# Patient Record
Sex: Female | Born: 1935 | Race: Black or African American | Hispanic: No | Smoking: Never smoker
Health system: Southern US, Community
[De-identification: ages and names within clinical notes are randomized; demographics above are authoritative.]

## PROBLEM LIST (undated history)

## (undated) DIAGNOSIS — E538 Deficiency of other specified B group vitamins: Secondary | ICD-10-CM

## (undated) DIAGNOSIS — K5730 Diverticulosis of large intestine without perforation or abscess without bleeding: Secondary | ICD-10-CM

## (undated) DIAGNOSIS — D649 Anemia, unspecified: Secondary | ICD-10-CM

## (undated) DIAGNOSIS — I10 Essential (primary) hypertension: Secondary | ICD-10-CM

## (undated) DIAGNOSIS — L042 Acute lymphadenitis of upper limb: Secondary | ICD-10-CM

## (undated) DIAGNOSIS — M179 Osteoarthritis of knee, unspecified: Secondary | ICD-10-CM

## (undated) DIAGNOSIS — K2970 Gastritis, unspecified, without bleeding: Secondary | ICD-10-CM

## (undated) DIAGNOSIS — F329 Major depressive disorder, single episode, unspecified: Secondary | ICD-10-CM

## (undated) DIAGNOSIS — C50919 Malignant neoplasm of unspecified site of unspecified female breast: Secondary | ICD-10-CM

## (undated) DIAGNOSIS — B9681 Helicobacter pylori [H. pylori] as the cause of diseases classified elsewhere: Secondary | ICD-10-CM

## (undated) DIAGNOSIS — M1710 Unilateral primary osteoarthritis, unspecified knee: Secondary | ICD-10-CM

## (undated) DIAGNOSIS — R011 Cardiac murmur, unspecified: Secondary | ICD-10-CM

## (undated) DIAGNOSIS — I5042 Chronic combined systolic (congestive) and diastolic (congestive) heart failure: Secondary | ICD-10-CM

## (undated) DIAGNOSIS — E785 Hyperlipidemia, unspecified: Secondary | ICD-10-CM

## (undated) DIAGNOSIS — I4891 Unspecified atrial fibrillation: Secondary | ICD-10-CM

## (undated) DIAGNOSIS — K219 Gastro-esophageal reflux disease without esophagitis: Secondary | ICD-10-CM

## (undated) DIAGNOSIS — I420 Dilated cardiomyopathy: Secondary | ICD-10-CM

## (undated) DIAGNOSIS — C4490 Unspecified malignant neoplasm of skin, unspecified: Secondary | ICD-10-CM

## (undated) HISTORY — DX: Unspecified malignant neoplasm of skin, unspecified: C44.90

## (undated) HISTORY — DX: Gastritis, unspecified, without bleeding: K29.70

## (undated) HISTORY — DX: Major depressive disorder, single episode, unspecified: F32.9

## (undated) HISTORY — DX: Malignant neoplasm of unspecified site of unspecified female breast: C50.919

## (undated) HISTORY — DX: Diverticulosis of large intestine without perforation or abscess without bleeding: K57.30

## (undated) HISTORY — DX: Deficiency of other specified B group vitamins: E53.8

## (undated) HISTORY — PX: JOINT REPLACEMENT: SHX530

## (undated) HISTORY — PX: ABDOMINAL HYSTERECTOMY: SHX81

## (undated) HISTORY — DX: Unilateral primary osteoarthritis, unspecified knee: M17.10

## (undated) HISTORY — DX: Hyperlipidemia, unspecified: E78.5

## (undated) HISTORY — DX: Anemia, unspecified: D64.9

## (undated) HISTORY — DX: Essential (primary) hypertension: I10

## (undated) HISTORY — DX: Gastro-esophageal reflux disease without esophagitis: K21.9

## (undated) HISTORY — DX: Osteoarthritis of knee, unspecified: M17.9

## (undated) HISTORY — DX: Helicobacter pylori (H. pylori) as the cause of diseases classified elsewhere: B96.81

## (undated) HISTORY — DX: Unspecified atrial fibrillation: I48.91

---
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
## 1997-12-16 ENCOUNTER — Inpatient Hospital Stay (HOSPITAL_COMMUNITY): Admission: EM | Admit: 1997-12-16 | Discharge: 1997-12-20 | Payer: Self-pay | Admitting: Hematology and Oncology

## 1998-05-01 ENCOUNTER — Ambulatory Visit (HOSPITAL_COMMUNITY): Admission: RE | Admit: 1998-05-01 | Discharge: 1998-05-01 | Payer: Self-pay | Admitting: Cardiology

## 1998-05-01 ENCOUNTER — Encounter: Payer: Self-pay | Admitting: Cardiology

## 1998-05-15 ENCOUNTER — Other Ambulatory Visit: Admission: RE | Admit: 1998-05-15 | Discharge: 1998-05-15 | Payer: Self-pay | Admitting: Specialist

## 1998-06-04 ENCOUNTER — Inpatient Hospital Stay (HOSPITAL_COMMUNITY): Admission: RE | Admit: 1998-06-04 | Discharge: 1998-06-11 | Payer: Self-pay | Admitting: Specialist

## 1998-06-04 HISTORY — PX: TOTAL KNEE ARTHROPLASTY: SHX125

## 1998-10-22 ENCOUNTER — Ambulatory Visit (HOSPITAL_COMMUNITY): Admission: RE | Admit: 1998-10-22 | Discharge: 1998-10-22 | Payer: Self-pay | Admitting: *Deleted

## 1998-10-22 ENCOUNTER — Encounter: Payer: Self-pay | Admitting: *Deleted

## 1999-08-21 ENCOUNTER — Encounter: Payer: Self-pay | Admitting: Oncology

## 1999-08-21 ENCOUNTER — Encounter: Admission: RE | Admit: 1999-08-21 | Discharge: 1999-08-21 | Payer: Self-pay | Admitting: Oncology

## 1999-10-21 ENCOUNTER — Encounter: Payer: Self-pay | Admitting: Gastroenterology

## 1999-10-21 ENCOUNTER — Ambulatory Visit (HOSPITAL_COMMUNITY): Admission: RE | Admit: 1999-10-21 | Discharge: 1999-10-21 | Payer: Self-pay | Admitting: Gastroenterology

## 1999-10-22 ENCOUNTER — Other Ambulatory Visit: Admission: RE | Admit: 1999-10-22 | Discharge: 1999-10-22 | Payer: Self-pay | Admitting: *Deleted

## 2000-06-25 ENCOUNTER — Encounter: Admission: RE | Admit: 2000-06-25 | Discharge: 2000-06-25 | Payer: Self-pay

## 2000-11-05 ENCOUNTER — Ambulatory Visit (HOSPITAL_BASED_OUTPATIENT_CLINIC_OR_DEPARTMENT_OTHER): Admission: RE | Admit: 2000-11-05 | Discharge: 2000-11-05 | Payer: Self-pay | Admitting: Pulmonary Disease

## 2001-04-14 ENCOUNTER — Encounter: Admission: RE | Admit: 2001-04-14 | Discharge: 2001-04-14 | Payer: Self-pay | Admitting: Surgery

## 2001-04-14 ENCOUNTER — Encounter: Payer: Self-pay | Admitting: Surgery

## 2001-08-31 ENCOUNTER — Emergency Department (HOSPITAL_COMMUNITY): Admission: EM | Admit: 2001-08-31 | Discharge: 2001-08-31 | Payer: Self-pay | Admitting: Emergency Medicine

## 2002-01-05 ENCOUNTER — Encounter: Admission: RE | Admit: 2002-01-05 | Discharge: 2002-01-05 | Payer: Self-pay | Admitting: Oncology

## 2002-01-05 ENCOUNTER — Encounter: Payer: Self-pay | Admitting: Oncology

## 2002-06-27 ENCOUNTER — Ambulatory Visit (HOSPITAL_BASED_OUTPATIENT_CLINIC_OR_DEPARTMENT_OTHER): Admission: RE | Admit: 2002-06-27 | Discharge: 2002-06-27 | Payer: Self-pay | Admitting: Specialist

## 2002-09-01 DIAGNOSIS — K5730 Diverticulosis of large intestine without perforation or abscess without bleeding: Secondary | ICD-10-CM

## 2002-09-01 HISTORY — DX: Diverticulosis of large intestine without perforation or abscess without bleeding: K57.30

## 2002-09-28 ENCOUNTER — Encounter: Payer: Self-pay | Admitting: General Surgery

## 2002-09-28 ENCOUNTER — Encounter (INDEPENDENT_AMBULATORY_CARE_PROVIDER_SITE_OTHER): Payer: Self-pay | Admitting: Specialist

## 2002-09-28 ENCOUNTER — Encounter: Admission: RE | Admit: 2002-09-28 | Discharge: 2002-09-28 | Payer: Self-pay | Admitting: General Surgery

## 2003-08-07 ENCOUNTER — Encounter: Payer: Self-pay | Admitting: Gastroenterology

## 2003-08-24 ENCOUNTER — Ambulatory Visit (HOSPITAL_COMMUNITY): Admission: RE | Admit: 2003-08-24 | Discharge: 2003-08-24 | Payer: Self-pay | Admitting: General Practice

## 2003-10-05 ENCOUNTER — Encounter: Admission: RE | Admit: 2003-10-05 | Discharge: 2003-10-05 | Payer: Self-pay | Admitting: Oncology

## 2004-06-17 ENCOUNTER — Encounter (INDEPENDENT_AMBULATORY_CARE_PROVIDER_SITE_OTHER): Payer: Self-pay | Admitting: *Deleted

## 2004-06-17 ENCOUNTER — Ambulatory Visit (HOSPITAL_BASED_OUTPATIENT_CLINIC_OR_DEPARTMENT_OTHER): Admission: RE | Admit: 2004-06-17 | Discharge: 2004-06-17 | Payer: Self-pay | Admitting: Otolaryngology

## 2004-06-17 ENCOUNTER — Ambulatory Visit (HOSPITAL_COMMUNITY): Admission: RE | Admit: 2004-06-17 | Discharge: 2004-06-17 | Payer: Self-pay | Admitting: Otolaryngology

## 2004-07-18 ENCOUNTER — Ambulatory Visit: Payer: Self-pay | Admitting: Oncology

## 2004-08-09 ENCOUNTER — Ambulatory Visit: Payer: Self-pay | Admitting: Internal Medicine

## 2004-10-07 ENCOUNTER — Encounter: Admission: RE | Admit: 2004-10-07 | Discharge: 2004-10-07 | Payer: Self-pay | Admitting: General Surgery

## 2004-11-26 ENCOUNTER — Ambulatory Visit: Payer: Self-pay | Admitting: Internal Medicine

## 2004-11-29 ENCOUNTER — Ambulatory Visit: Payer: Self-pay | Admitting: Family Medicine

## 2004-12-10 ENCOUNTER — Ambulatory Visit: Payer: Self-pay | Admitting: Internal Medicine

## 2004-12-10 ENCOUNTER — Inpatient Hospital Stay (HOSPITAL_COMMUNITY): Admission: EM | Admit: 2004-12-10 | Discharge: 2004-12-15 | Payer: Self-pay | Admitting: Emergency Medicine

## 2004-12-11 ENCOUNTER — Ambulatory Visit: Payer: Self-pay | Admitting: Cardiology

## 2004-12-11 ENCOUNTER — Encounter: Payer: Self-pay | Admitting: Cardiology

## 2004-12-17 ENCOUNTER — Ambulatory Visit: Payer: Self-pay | Admitting: Internal Medicine

## 2004-12-26 ENCOUNTER — Ambulatory Visit: Payer: Self-pay | Admitting: Internal Medicine

## 2005-01-07 ENCOUNTER — Ambulatory Visit: Payer: Self-pay | Admitting: Internal Medicine

## 2005-02-07 ENCOUNTER — Ambulatory Visit: Payer: Self-pay | Admitting: Internal Medicine

## 2005-03-17 ENCOUNTER — Ambulatory Visit: Payer: Self-pay | Admitting: Internal Medicine

## 2005-03-24 ENCOUNTER — Ambulatory Visit: Payer: Self-pay | Admitting: Internal Medicine

## 2005-04-09 ENCOUNTER — Ambulatory Visit: Payer: Self-pay | Admitting: Internal Medicine

## 2005-07-17 ENCOUNTER — Ambulatory Visit: Payer: Self-pay | Admitting: Oncology

## 2005-09-19 ENCOUNTER — Ambulatory Visit: Payer: Self-pay | Admitting: Internal Medicine

## 2005-10-09 ENCOUNTER — Encounter: Admission: RE | Admit: 2005-10-09 | Discharge: 2005-10-09 | Payer: Self-pay | Admitting: Oncology

## 2005-10-24 ENCOUNTER — Ambulatory Visit: Payer: Self-pay | Admitting: Oncology

## 2005-11-06 ENCOUNTER — Emergency Department (HOSPITAL_COMMUNITY): Admission: EM | Admit: 2005-11-06 | Discharge: 2005-11-06 | Payer: Self-pay | Admitting: Emergency Medicine

## 2005-12-30 ENCOUNTER — Ambulatory Visit: Payer: Self-pay | Admitting: Internal Medicine

## 2006-01-22 ENCOUNTER — Ambulatory Visit: Payer: Self-pay | Admitting: Internal Medicine

## 2006-02-27 ENCOUNTER — Ambulatory Visit: Payer: Self-pay | Admitting: Internal Medicine

## 2006-03-09 ENCOUNTER — Ambulatory Visit: Payer: Self-pay | Admitting: Internal Medicine

## 2006-03-24 ENCOUNTER — Ambulatory Visit (HOSPITAL_COMMUNITY): Admission: RE | Admit: 2006-03-24 | Discharge: 2006-03-24 | Payer: Self-pay | Admitting: Obstetrics and Gynecology

## 2006-03-26 ENCOUNTER — Ambulatory Visit (HOSPITAL_COMMUNITY): Admission: RE | Admit: 2006-03-26 | Discharge: 2006-03-26 | Payer: Self-pay | Admitting: Obstetrics and Gynecology

## 2006-05-06 ENCOUNTER — Encounter: Admission: RE | Admit: 2006-05-06 | Discharge: 2006-05-06 | Payer: Self-pay | Admitting: Orthopedic Surgery

## 2006-05-28 ENCOUNTER — Ambulatory Visit: Payer: Self-pay | Admitting: Internal Medicine

## 2006-07-03 ENCOUNTER — Ambulatory Visit: Payer: Self-pay | Admitting: Internal Medicine

## 2006-07-03 LAB — CONVERTED CEMR LAB: Sed Rate: 30 mm/hr — ABNORMAL HIGH (ref 0–25)

## 2006-08-07 ENCOUNTER — Ambulatory Visit: Payer: Self-pay | Admitting: Internal Medicine

## 2006-08-07 LAB — CONVERTED CEMR LAB
ALT: 18 units/L (ref 0–40)
Creatinine, Ser: 1.2 mg/dL (ref 0.4–1.2)
Crystals: NEGATIVE
Hemoglobin: 12.2 g/dL (ref 12.0–15.0)
Ketones, ur: NEGATIVE mg/dL
MCHC: 32.5 g/dL (ref 30.0–36.0)
Monocytes Absolute: 0.5 10*3/uL (ref 0.2–0.7)
Monocytes Relative: 9.1 % (ref 3.0–11.0)
RBC: 4.45 M/uL (ref 3.87–5.11)
RDW: 13.1 % (ref 11.5–14.6)
Specific Gravity, Urine: 1.025 (ref 1.000–1.03)
Urine Glucose: NEGATIVE mg/dL
Urobilinogen, UA: 0.2 (ref 0.0–1.0)
pH: 5.5 (ref 5.0–8.0)

## 2006-10-12 ENCOUNTER — Encounter: Admission: RE | Admit: 2006-10-12 | Discharge: 2006-10-12 | Payer: Self-pay | Admitting: General Surgery

## 2006-11-30 ENCOUNTER — Ambulatory Visit: Payer: Self-pay | Admitting: Internal Medicine

## 2006-11-30 LAB — CONVERTED CEMR LAB
AST: 17 units/L (ref 0–37)
Albumin: 3.5 g/dL (ref 3.5–5.2)
Basophils Absolute: 0.1 10*3/uL (ref 0.0–0.1)
Bilirubin Urine: NEGATIVE
Bilirubin, Direct: 0.1 mg/dL (ref 0.0–0.3)
Creatinine, Ser: 0.8 mg/dL (ref 0.4–1.2)
Eosinophils Absolute: 0.2 10*3/uL (ref 0.0–0.6)
Glucose, Bld: 95 mg/dL (ref 70–99)
Hemoglobin: 12.7 g/dL (ref 12.0–15.0)
Iron: 75 ug/dL (ref 42–145)
Lymphocytes Relative: 57.6 % — ABNORMAL HIGH (ref 12.0–46.0)
MCHC: 34.4 g/dL (ref 30.0–36.0)
MCV: 83.5 fL (ref 78.0–100.0)
Monocytes Absolute: 0.4 10*3/uL (ref 0.2–0.7)
Monocytes Relative: 7.7 % (ref 3.0–11.0)
Neutro Abs: 1.5 10*3/uL (ref 1.4–7.7)
Nitrite: NEGATIVE
Potassium: 3.5 meq/L (ref 3.5–5.1)
Sed Rate: 36 mm/hr — ABNORMAL HIGH (ref 0–25)
Sodium: 143 meq/L (ref 135–145)
Specific Gravity, Urine: 1.025 (ref 1.000–1.03)
Total Bilirubin: 0.7 mg/dL (ref 0.3–1.2)
Total Protein, Urine: NEGATIVE mg/dL
Urine Glucose: NEGATIVE mg/dL
Urobilinogen, UA: 0.2 (ref 0.0–1.0)
Vit D, 1,25-Dihydroxy: 13 — ABNORMAL LOW (ref 20–57)
WBC, UA: NONE SEEN cells/hpf
pH: 5.5 (ref 5.0–8.0)

## 2006-12-29 ENCOUNTER — Ambulatory Visit: Payer: Self-pay | Admitting: Internal Medicine

## 2006-12-30 ENCOUNTER — Encounter: Payer: Self-pay | Admitting: Internal Medicine

## 2006-12-30 LAB — CONVERTED CEMR LAB
AST: 16 units/L (ref 0–37)
Albumin ELP: 54.1 % — ABNORMAL LOW (ref 55.8–66.1)
Albumin: 3.5 g/dL (ref 3.5–5.2)
Alkaline Phosphatase: 58 units/L (ref 39–117)
Alpha-2-Globulin: 11.2 % (ref 7.1–11.8)
BUN: 14 mg/dL (ref 6–23)
Basophils Absolute: 0.1 10*3/uL (ref 0.0–0.1)
Basophils Relative: 1.3 % — ABNORMAL HIGH (ref 0.0–1.0)
Beta Globulin: 6.2 % (ref 4.7–7.2)
CO2: 33 meq/L — ABNORMAL HIGH (ref 19–32)
Chloride: 103 meq/L (ref 96–112)
Creatinine, Ser: 0.9 mg/dL (ref 0.4–1.2)
HCT: 36.3 % (ref 36.0–46.0)
Hemoglobin: 12.5 g/dL (ref 12.0–15.0)
MCHC: 34.5 g/dL (ref 30.0–36.0)
Neutrophils Relative %: 30.2 % — ABNORMAL LOW (ref 43.0–77.0)
RBC: 4.36 M/uL (ref 3.87–5.11)
RDW: 13.2 % (ref 11.5–14.6)
Sodium: 142 meq/L (ref 135–145)
Total Bilirubin: 0.5 mg/dL (ref 0.3–1.2)
Total Protein, Serum Electrophoresis: 7.7 g/dL (ref 6.0–8.3)
Total Protein: 7.4 g/dL (ref 6.0–8.3)

## 2007-01-29 ENCOUNTER — Ambulatory Visit: Payer: Self-pay | Admitting: Internal Medicine

## 2007-03-18 ENCOUNTER — Ambulatory Visit: Payer: Self-pay | Admitting: Internal Medicine

## 2007-03-18 LAB — CONVERTED CEMR LAB
CO2: 33 meq/L — ABNORMAL HIGH (ref 19–32)
Creatinine, Ser: 0.8 mg/dL (ref 0.4–1.2)
Glucose, Bld: 96 mg/dL (ref 70–99)
Potassium: 3.6 meq/L (ref 3.5–5.1)
Sodium: 141 meq/L (ref 135–145)

## 2007-03-27 ENCOUNTER — Encounter: Payer: Self-pay | Admitting: Internal Medicine

## 2007-03-27 DIAGNOSIS — E538 Deficiency of other specified B group vitamins: Secondary | ICD-10-CM

## 2007-03-27 DIAGNOSIS — J209 Acute bronchitis, unspecified: Secondary | ICD-10-CM

## 2007-03-27 DIAGNOSIS — E559 Vitamin D deficiency, unspecified: Secondary | ICD-10-CM | POA: Insufficient documentation

## 2007-03-27 DIAGNOSIS — E785 Hyperlipidemia, unspecified: Secondary | ICD-10-CM | POA: Insufficient documentation

## 2007-03-27 DIAGNOSIS — G4700 Insomnia, unspecified: Secondary | ICD-10-CM

## 2007-03-27 DIAGNOSIS — I10 Essential (primary) hypertension: Secondary | ICD-10-CM | POA: Insufficient documentation

## 2007-03-27 DIAGNOSIS — D509 Iron deficiency anemia, unspecified: Secondary | ICD-10-CM | POA: Insufficient documentation

## 2007-03-27 DIAGNOSIS — K5730 Diverticulosis of large intestine without perforation or abscess without bleeding: Secondary | ICD-10-CM | POA: Insufficient documentation

## 2007-03-31 ENCOUNTER — Ambulatory Visit: Payer: Self-pay | Admitting: Internal Medicine

## 2007-04-05 ENCOUNTER — Ambulatory Visit: Payer: Self-pay | Admitting: Internal Medicine

## 2007-04-05 LAB — CONVERTED CEMR LAB
Bacteria, UA: NEGATIVE
Crystals: NEGATIVE
Mucus, UA: NEGATIVE
Total Protein, Urine: NEGATIVE mg/dL
pH: 5.5 (ref 5.0–8.0)

## 2007-04-14 ENCOUNTER — Ambulatory Visit: Payer: Self-pay | Admitting: Internal Medicine

## 2007-06-21 ENCOUNTER — Encounter: Payer: Self-pay | Admitting: Internal Medicine

## 2007-06-21 ENCOUNTER — Ambulatory Visit: Payer: Self-pay | Admitting: Internal Medicine

## 2007-06-21 DIAGNOSIS — M79609 Pain in unspecified limb: Secondary | ICD-10-CM

## 2007-07-09 ENCOUNTER — Encounter: Admission: RE | Admit: 2007-07-09 | Discharge: 2007-07-09 | Payer: Self-pay | Admitting: General Surgery

## 2007-08-20 ENCOUNTER — Encounter: Admission: RE | Admit: 2007-08-20 | Discharge: 2007-08-20 | Payer: Self-pay | Admitting: Obstetrics and Gynecology

## 2007-09-02 DIAGNOSIS — F32A Depression, unspecified: Secondary | ICD-10-CM

## 2007-09-02 HISTORY — DX: Depression, unspecified: F32.A

## 2007-09-28 ENCOUNTER — Ambulatory Visit: Payer: Self-pay | Admitting: Internal Medicine

## 2007-10-21 ENCOUNTER — Ambulatory Visit: Payer: Self-pay | Admitting: Internal Medicine

## 2007-10-21 ENCOUNTER — Encounter: Payer: Self-pay | Admitting: Internal Medicine

## 2007-10-21 DIAGNOSIS — R634 Abnormal weight loss: Secondary | ICD-10-CM | POA: Insufficient documentation

## 2007-10-26 LAB — CONVERTED CEMR LAB
Basophils Absolute: 0 10*3/uL (ref 0.0–0.1)
Basophils Relative: 0.8 % (ref 0.0–1.0)
Bilirubin, Direct: 0.1 mg/dL (ref 0.0–0.3)
CO2: 32 meq/L (ref 19–32)
Creatinine, Ser: 0.7 mg/dL (ref 0.4–1.2)
Glucose, Bld: 108 mg/dL — ABNORMAL HIGH (ref 70–99)
HCT: 39 % (ref 36.0–46.0)
Hemoglobin: 12.8 g/dL (ref 12.0–15.0)
Iron: 53 ug/dL (ref 42–145)
MCHC: 32.7 g/dL (ref 30.0–36.0)
Monocytes Absolute: 0.4 10*3/uL (ref 0.2–0.7)
Neutrophils Relative %: 34.8 % — ABNORMAL LOW (ref 43.0–77.0)
Potassium: 3.2 meq/L — ABNORMAL LOW (ref 3.5–5.1)
RDW: 13.6 % (ref 11.5–14.6)
Sed Rate: 29 mm/hr — ABNORMAL HIGH (ref 0–25)
Sodium: 142 meq/L (ref 135–145)
Total Bilirubin: 0.6 mg/dL (ref 0.3–1.2)
Total Protein: 7.6 g/dL (ref 6.0–8.3)

## 2007-12-22 ENCOUNTER — Ambulatory Visit: Payer: Self-pay | Admitting: Internal Medicine

## 2007-12-22 DIAGNOSIS — Z853 Personal history of malignant neoplasm of breast: Secondary | ICD-10-CM

## 2007-12-22 DIAGNOSIS — E876 Hypokalemia: Secondary | ICD-10-CM

## 2007-12-22 DIAGNOSIS — R300 Dysuria: Secondary | ICD-10-CM | POA: Insufficient documentation

## 2007-12-24 LAB — CONVERTED CEMR LAB
ALT: 14 units/L (ref 0–35)
Albumin: 3.8 g/dL (ref 3.5–5.2)
BUN: 9 mg/dL (ref 6–23)
Basophils Absolute: 0.1 10*3/uL (ref 0.0–0.1)
Basophils Relative: 1 % (ref 0.0–1.0)
Bilirubin Urine: NEGATIVE
Bilirubin, Direct: 0.1 mg/dL (ref 0.0–0.3)
Calcium: 9.8 mg/dL (ref 8.4–10.5)
Creatinine, Ser: 0.8 mg/dL (ref 0.4–1.2)
Crystals: NEGATIVE
GFR calc Af Amer: 91 mL/min
Glucose, Bld: 86 mg/dL (ref 70–99)
HCT: 40 % (ref 36.0–46.0)
Hemoglobin: 13.2 g/dL (ref 12.0–15.0)
Ketones, ur: NEGATIVE mg/dL
MCHC: 32.9 g/dL (ref 30.0–36.0)
Monocytes Absolute: 0.5 10*3/uL (ref 0.1–1.0)
Monocytes Relative: 7.7 % (ref 3.0–12.0)
Neutro Abs: 2 10*3/uL (ref 1.4–7.7)
RDW: 13.4 % (ref 11.5–14.6)
Sed Rate: 36 mm/hr — ABNORMAL HIGH (ref 0–22)
Sodium: 142 meq/L (ref 135–145)
TSH: 1.3 microintl units/mL (ref 0.35–5.50)
Total Protein, Urine: NEGATIVE mg/dL
Total Protein: 7.9 g/dL (ref 6.0–8.3)
WBC, UA: NONE SEEN cells/hpf
pH: 6 (ref 5.0–8.0)

## 2008-01-25 ENCOUNTER — Ambulatory Visit: Payer: Self-pay | Admitting: Internal Medicine

## 2008-01-25 LAB — CONVERTED CEMR LAB
Bilirubin Urine: NEGATIVE
Ketones, ur: NEGATIVE mg/dL
Leukocytes, UA: NEGATIVE
Nitrite: NEGATIVE
Urobilinogen, UA: 0.2 (ref 0.0–1.0)

## 2008-01-26 ENCOUNTER — Encounter: Payer: Self-pay | Admitting: Internal Medicine

## 2008-01-31 ENCOUNTER — Encounter: Payer: Self-pay | Admitting: Internal Medicine

## 2008-02-14 ENCOUNTER — Ambulatory Visit: Payer: Self-pay | Admitting: Endocrinology

## 2008-02-14 DIAGNOSIS — J069 Acute upper respiratory infection, unspecified: Secondary | ICD-10-CM | POA: Insufficient documentation

## 2008-05-17 ENCOUNTER — Telehealth: Payer: Self-pay | Admitting: Internal Medicine

## 2008-05-23 ENCOUNTER — Ambulatory Visit: Payer: Self-pay | Admitting: Internal Medicine

## 2008-05-24 LAB — CONVERTED CEMR LAB
Albumin: 3.7 g/dL (ref 3.5–5.2)
BUN: 10 mg/dL (ref 6–23)
Basophils Absolute: 0.1 10*3/uL (ref 0.0–0.1)
Chloride: 105 meq/L (ref 96–112)
Creatinine, Ser: 1 mg/dL (ref 0.4–1.2)
Folate: 9.6 ng/mL
GFR calc Af Amer: 70 mL/min
GFR calc non Af Amer: 58 mL/min
Hemoglobin: 12.6 g/dL (ref 12.0–15.0)
Lymphocytes Relative: 53.2 % — ABNORMAL HIGH (ref 12.0–46.0)
MCHC: 34.3 g/dL (ref 30.0–36.0)
Monocytes Absolute: 0.5 10*3/uL (ref 0.1–1.0)
Monocytes Relative: 9.1 % (ref 3.0–12.0)
Neutro Abs: 1.7 10*3/uL (ref 1.4–7.7)
Platelets: 270 10*3/uL (ref 150–400)
Potassium: 3.6 meq/L (ref 3.5–5.1)
RDW: 14 % (ref 11.5–14.6)
Sed Rate: 27 mm/hr — ABNORMAL HIGH (ref 0–22)
Total Bilirubin: 0.6 mg/dL (ref 0.3–1.2)
Vitamin B-12: 284 pg/mL (ref 211–911)

## 2008-08-11 ENCOUNTER — Encounter: Admission: RE | Admit: 2008-08-11 | Discharge: 2008-08-11 | Payer: Self-pay | Admitting: General Surgery

## 2008-08-15 ENCOUNTER — Ambulatory Visit: Payer: Self-pay | Admitting: Internal Medicine

## 2008-08-15 DIAGNOSIS — M1990 Unspecified osteoarthritis, unspecified site: Secondary | ICD-10-CM

## 2008-08-15 DIAGNOSIS — F4321 Adjustment disorder with depressed mood: Secondary | ICD-10-CM | POA: Insufficient documentation

## 2008-08-15 DIAGNOSIS — F329 Major depressive disorder, single episode, unspecified: Secondary | ICD-10-CM

## 2008-08-22 ENCOUNTER — Encounter: Admission: RE | Admit: 2008-08-22 | Discharge: 2008-08-22 | Payer: Self-pay | Admitting: General Surgery

## 2008-08-28 ENCOUNTER — Encounter: Admission: RE | Admit: 2008-08-28 | Discharge: 2008-08-28 | Payer: Self-pay | Admitting: General Surgery

## 2008-08-28 ENCOUNTER — Encounter (INDEPENDENT_AMBULATORY_CARE_PROVIDER_SITE_OTHER): Payer: Self-pay | Admitting: Diagnostic Radiology

## 2008-08-31 ENCOUNTER — Telehealth: Payer: Self-pay | Admitting: Internal Medicine

## 2008-09-05 ENCOUNTER — Encounter: Payer: Self-pay | Admitting: Internal Medicine

## 2008-09-06 ENCOUNTER — Ambulatory Visit: Payer: Self-pay | Admitting: Oncology

## 2008-09-06 LAB — CBC WITH DIFFERENTIAL/PLATELET
BASO%: 0.7 % (ref 0.0–2.0)
Basophils Absolute: 0 10*3/uL (ref 0.0–0.1)
EOS%: 2.7 % (ref 0.0–7.0)
Eosinophils Absolute: 0.2 10*3/uL (ref 0.0–0.5)
HCT: 36.2 % (ref 34.8–46.6)
HGB: 12.2 g/dL (ref 11.6–15.9)
LYMPH%: 54.9 % — ABNORMAL HIGH (ref 14.0–48.0)
MCH: 28.4 pg (ref 26.0–34.0)
MCHC: 33.6 g/dL (ref 32.0–36.0)
MCV: 84.5 fL (ref 81.0–101.0)
MONO#: 0.5 10*3/uL (ref 0.1–0.9)
MONO%: 7.1 % (ref 0.0–13.0)
NEUT#: 2.3 10*3/uL (ref 1.5–6.5)
NEUT%: 34.6 % — ABNORMAL LOW (ref 39.6–76.8)
Platelets: 255 10*3/uL (ref 145–400)
RBC: 4.28 10*6/uL (ref 3.70–5.32)
RDW: 13.9 % (ref 11.3–14.5)
WBC: 6.8 10*3/uL (ref 3.9–10.0)
lymph#: 3.7 10*3/uL — ABNORMAL HIGH (ref 0.9–3.3)

## 2008-09-07 LAB — COMPREHENSIVE METABOLIC PANEL
BUN: 14 mg/dL (ref 6–23)
CO2: 30 mEq/L (ref 19–32)
Calcium: 9.4 mg/dL (ref 8.4–10.5)
Chloride: 100 mEq/L (ref 96–112)
Creatinine, Ser: 0.85 mg/dL (ref 0.40–1.20)
Total Bilirubin: 0.3 mg/dL (ref 0.3–1.2)

## 2008-09-07 LAB — CANCER ANTIGEN 27.29: CA 27.29: 19 U/mL (ref 0–39)

## 2008-09-07 LAB — VITAMIN D 25 HYDROXY (VIT D DEFICIENCY, FRACTURES): Vit D, 25-Hydroxy: 19 ng/mL — ABNORMAL LOW (ref 30–89)

## 2008-09-11 ENCOUNTER — Encounter: Admission: RE | Admit: 2008-09-11 | Discharge: 2008-09-11 | Payer: Self-pay | Admitting: General Surgery

## 2008-09-13 ENCOUNTER — Ambulatory Visit (HOSPITAL_BASED_OUTPATIENT_CLINIC_OR_DEPARTMENT_OTHER): Admission: RE | Admit: 2008-09-13 | Discharge: 2008-09-14 | Payer: Self-pay | Admitting: General Surgery

## 2008-09-13 ENCOUNTER — Encounter (INDEPENDENT_AMBULATORY_CARE_PROVIDER_SITE_OTHER): Payer: Self-pay | Admitting: General Surgery

## 2008-09-13 HISTORY — PX: MASTECTOMY: SHX3

## 2008-10-03 ENCOUNTER — Telehealth: Payer: Self-pay | Admitting: Internal Medicine

## 2008-10-04 ENCOUNTER — Ambulatory Visit: Payer: Self-pay | Admitting: Internal Medicine

## 2008-10-04 DIAGNOSIS — B029 Zoster without complications: Secondary | ICD-10-CM

## 2008-10-04 DIAGNOSIS — R209 Unspecified disturbances of skin sensation: Secondary | ICD-10-CM

## 2008-10-10 LAB — CONVERTED CEMR LAB
ALT: 13 units/L (ref 0–35)
AST: 18 units/L (ref 0–37)
Basophils Absolute: 0.1 10*3/uL (ref 0.0–0.1)
Basophils Relative: 1.1 % (ref 0.0–3.0)
Bilirubin, Direct: 0.1 mg/dL (ref 0.0–0.3)
CO2: 30 meq/L (ref 19–32)
Chloride: 102 meq/L (ref 96–112)
Creatinine, Ser: 0.8 mg/dL (ref 0.4–1.2)
Eosinophils Relative: 3.2 % (ref 0.0–5.0)
Glucose, Bld: 96 mg/dL (ref 70–99)
Hemoglobin: 12.7 g/dL (ref 12.0–15.0)
Leukocytes, UA: NEGATIVE
Lipase: 20 units/L (ref 11.0–59.0)
Lymphocytes Relative: 59.1 % — ABNORMAL HIGH (ref 12.0–46.0)
MCHC: 33.8 g/dL (ref 30.0–36.0)
Monocytes Relative: 8.3 % (ref 3.0–12.0)
Mucus, UA: NEGATIVE
Neutrophils Relative %: 28.3 % — ABNORMAL LOW (ref 43.0–77.0)
Nitrite: NEGATIVE
RBC: 4.44 M/uL (ref 3.87–5.11)
Specific Gravity, Urine: 1.025 (ref 1.000–1.03)
Total Bilirubin: 0.5 mg/dL (ref 0.3–1.2)
Urobilinogen, UA: 0.2 (ref 0.0–1.0)
WBC: 5.1 10*3/uL (ref 4.5–10.5)
pH: 5.5 (ref 5.0–8.0)

## 2008-10-25 ENCOUNTER — Encounter: Payer: Self-pay | Admitting: Internal Medicine

## 2008-10-30 ENCOUNTER — Ambulatory Visit: Payer: Self-pay | Admitting: Oncology

## 2008-11-01 ENCOUNTER — Encounter: Payer: Self-pay | Admitting: Internal Medicine

## 2008-11-01 LAB — CBC WITH DIFFERENTIAL/PLATELET
BASO%: 1.2 % (ref 0.0–2.0)
Basophils Absolute: 0.1 10*3/uL (ref 0.0–0.1)
EOS%: 2.7 % (ref 0.0–7.0)
HCT: 37.3 % (ref 34.8–46.6)
HGB: 12.4 g/dL (ref 11.6–15.9)
LYMPH%: 60.4 % — ABNORMAL HIGH (ref 14.0–49.7)
MCH: 28.1 pg (ref 25.1–34.0)
MCHC: 33.3 g/dL (ref 31.5–36.0)
MCV: 84.3 fL (ref 79.5–101.0)
MONO%: 9 % (ref 0.0–14.0)
NEUT%: 26.7 % — ABNORMAL LOW (ref 38.4–76.8)
Platelets: 283 10*3/uL (ref 145–400)

## 2008-11-01 LAB — COMPREHENSIVE METABOLIC PANEL
ALT: 14 U/L (ref 0–35)
AST: 20 U/L (ref 0–37)
BUN: 7 mg/dL (ref 6–23)
Calcium: 9.4 mg/dL (ref 8.4–10.5)
Chloride: 102 mEq/L (ref 96–112)
Creatinine, Ser: 0.79 mg/dL (ref 0.40–1.20)
Total Bilirubin: 0.4 mg/dL (ref 0.3–1.2)

## 2008-11-13 ENCOUNTER — Telehealth: Payer: Self-pay | Admitting: Internal Medicine

## 2008-11-21 ENCOUNTER — Ambulatory Visit: Payer: Self-pay | Admitting: Internal Medicine

## 2008-11-21 DIAGNOSIS — R21 Rash and other nonspecific skin eruption: Secondary | ICD-10-CM | POA: Insufficient documentation

## 2009-01-03 ENCOUNTER — Encounter: Admission: RE | Admit: 2009-01-03 | Discharge: 2009-01-03 | Payer: Self-pay | Admitting: Oncology

## 2009-01-16 ENCOUNTER — Ambulatory Visit: Payer: Self-pay | Admitting: Oncology

## 2009-01-18 LAB — COMPREHENSIVE METABOLIC PANEL
ALT: 15 U/L (ref 0–35)
AST: 20 U/L (ref 0–37)
Alkaline Phosphatase: 79 U/L (ref 39–117)
BUN: 14 mg/dL (ref 6–23)
Calcium: 9.4 mg/dL (ref 8.4–10.5)
Chloride: 100 mEq/L (ref 96–112)
Creatinine, Ser: 0.76 mg/dL (ref 0.40–1.20)
Total Bilirubin: 0.6 mg/dL (ref 0.3–1.2)

## 2009-01-18 LAB — CBC WITH DIFFERENTIAL/PLATELET
Eosinophils Absolute: 0.2 10*3/uL (ref 0.0–0.5)
MONO#: 0.5 10*3/uL (ref 0.1–0.9)
MONO%: 7.5 % (ref 0.0–14.0)
NEUT#: 1.8 10*3/uL (ref 1.5–6.5)
RBC: 4.55 10*6/uL (ref 3.70–5.45)
RDW: 15 % — ABNORMAL HIGH (ref 11.2–14.5)
WBC: 6.1 10*3/uL (ref 3.9–10.3)

## 2009-01-25 ENCOUNTER — Encounter: Payer: Self-pay | Admitting: Internal Medicine

## 2009-04-06 ENCOUNTER — Ambulatory Visit: Payer: Self-pay | Admitting: Oncology

## 2009-04-10 LAB — CBC WITH DIFFERENTIAL/PLATELET
Basophils Absolute: 0.1 10*3/uL (ref 0.0–0.1)
Eosinophils Absolute: 0.2 10*3/uL (ref 0.0–0.5)
HGB: 12.2 g/dL (ref 11.6–15.9)
MCV: 84.7 fL (ref 79.5–101.0)
MONO%: 6.5 % (ref 0.0–14.0)
NEUT#: 2.3 10*3/uL (ref 1.5–6.5)
RDW: 14.2 % (ref 11.2–14.5)
lymph#: 3.1 10*3/uL (ref 0.9–3.3)

## 2009-04-10 LAB — COMPREHENSIVE METABOLIC PANEL
Albumin: 3.6 g/dL (ref 3.5–5.2)
BUN: 14 mg/dL (ref 6–23)
Calcium: 9.6 mg/dL (ref 8.4–10.5)
Chloride: 102 mEq/L (ref 96–112)
Glucose, Bld: 132 mg/dL — ABNORMAL HIGH (ref 70–99)
Potassium: 3.7 mEq/L (ref 3.5–5.3)

## 2009-04-10 LAB — CANCER ANTIGEN 27.29: CA 27.29: 26 U/mL (ref 0–39)

## 2009-04-11 ENCOUNTER — Ambulatory Visit: Payer: Self-pay | Admitting: Internal Medicine

## 2009-04-16 ENCOUNTER — Encounter: Payer: Self-pay | Admitting: Internal Medicine

## 2009-05-04 ENCOUNTER — Ambulatory Visit: Payer: Self-pay | Admitting: Internal Medicine

## 2009-05-04 DIAGNOSIS — R6883 Chills (without fever): Secondary | ICD-10-CM

## 2009-05-08 LAB — CONVERTED CEMR LAB
BUN: 25 mg/dL — ABNORMAL HIGH (ref 6–23)
Basophils Relative: 1.5 % (ref 0.0–3.0)
Bilirubin Urine: NEGATIVE
Bilirubin, Direct: 0 mg/dL (ref 0.0–0.3)
CO2: 33 meq/L — ABNORMAL HIGH (ref 19–32)
Chloride: 97 meq/L (ref 96–112)
Creatinine, Ser: 1.2 mg/dL (ref 0.4–1.2)
Eosinophils Absolute: 0.2 10*3/uL (ref 0.0–0.7)
Glucose, Bld: 101 mg/dL — ABNORMAL HIGH (ref 70–99)
MCHC: 33.7 g/dL (ref 30.0–36.0)
MCV: 84.7 fL (ref 78.0–100.0)
Monocytes Absolute: 0.7 10*3/uL (ref 0.1–1.0)
Neutrophils Relative %: 32.5 % — ABNORMAL LOW (ref 43.0–77.0)
Nitrite: NEGATIVE
Platelets: 281 10*3/uL (ref 150.0–400.0)
Sed Rate: 90 mm/hr — ABNORMAL HIGH (ref 0–22)
Total Protein: 8 g/dL (ref 6.0–8.3)
Urobilinogen, UA: 0.2 (ref 0.0–1.0)
pH: 5.5 (ref 5.0–8.0)

## 2009-07-18 ENCOUNTER — Ambulatory Visit: Payer: Self-pay | Admitting: Internal Medicine

## 2009-07-19 ENCOUNTER — Encounter: Payer: Self-pay | Admitting: Internal Medicine

## 2009-07-23 LAB — CONVERTED CEMR LAB
Albumin: 3.8 g/dL (ref 3.5–5.2)
BUN: 15 mg/dL (ref 6–23)
Bilirubin Urine: NEGATIVE
CO2: 31 meq/L (ref 19–32)
Chloride: 102 meq/L (ref 96–112)
Creatinine, Ser: 0.8 mg/dL (ref 0.4–1.2)
Eosinophils Absolute: 0.1 10*3/uL (ref 0.0–0.7)
Glucose, Bld: 93 mg/dL (ref 70–99)
Leukocytes, UA: NEGATIVE
Lymphs Abs: 2.9 10*3/uL (ref 0.7–4.0)
MCHC: 33.2 g/dL (ref 30.0–36.0)
MCV: 87.5 fL (ref 78.0–100.0)
Monocytes Absolute: 0.3 10*3/uL (ref 0.1–1.0)
Neutrophils Relative %: 26.2 % — ABNORMAL LOW (ref 43.0–77.0)
Nitrite: NEGATIVE
Platelets: 240 10*3/uL (ref 150.0–400.0)
RDW: 14 % (ref 11.5–14.6)
Sed Rate: 19 mm/hr (ref 0–22)
Specific Gravity, Urine: 1.015 (ref 1.000–1.030)
Total Bilirubin: 0.7 mg/dL (ref 0.3–1.2)
Urobilinogen, UA: 0.2 (ref 0.0–1.0)
pH: 6 (ref 5.0–8.0)

## 2009-07-30 ENCOUNTER — Ambulatory Visit: Payer: Self-pay | Admitting: Internal Medicine

## 2009-08-16 ENCOUNTER — Telehealth: Payer: Self-pay | Admitting: Internal Medicine

## 2009-08-20 ENCOUNTER — Telehealth: Payer: Self-pay | Admitting: Internal Medicine

## 2009-09-01 HISTORY — PX: OTHER SURGICAL HISTORY: SHX169

## 2009-09-03 ENCOUNTER — Ambulatory Visit: Payer: Self-pay | Admitting: Internal Medicine

## 2009-09-03 DIAGNOSIS — J019 Acute sinusitis, unspecified: Secondary | ICD-10-CM

## 2009-09-06 ENCOUNTER — Telehealth: Payer: Self-pay | Admitting: Internal Medicine

## 2009-09-13 ENCOUNTER — Telehealth: Payer: Self-pay | Admitting: Internal Medicine

## 2009-09-27 ENCOUNTER — Telehealth: Payer: Self-pay | Admitting: Internal Medicine

## 2009-10-24 ENCOUNTER — Ambulatory Visit: Payer: Self-pay | Admitting: Internal Medicine

## 2009-10-29 ENCOUNTER — Telehealth: Payer: Self-pay | Admitting: Internal Medicine

## 2009-11-02 ENCOUNTER — Ambulatory Visit: Payer: Self-pay | Admitting: Oncology

## 2009-11-02 ENCOUNTER — Ambulatory Visit (HOSPITAL_COMMUNITY): Admission: RE | Admit: 2009-11-02 | Discharge: 2009-11-02 | Payer: Self-pay | Admitting: Oncology

## 2009-11-06 ENCOUNTER — Encounter: Payer: Self-pay | Admitting: Internal Medicine

## 2009-11-06 LAB — CBC WITH DIFFERENTIAL/PLATELET
Basophils Absolute: 0.1 10*3/uL (ref 0.0–0.1)
Eosinophils Absolute: 0.2 10*3/uL (ref 0.0–0.5)
HCT: 36.5 % (ref 34.8–46.6)
LYMPH%: 57.3 % — ABNORMAL HIGH (ref 14.0–49.7)
MCV: 85.5 fL (ref 79.5–101.0)
MONO#: 0.5 10*3/uL (ref 0.1–0.9)
MONO%: 8.9 % (ref 0.0–14.0)
NEUT#: 1.7 10*3/uL (ref 1.5–6.5)
NEUT%: 29.9 % — ABNORMAL LOW (ref 38.4–76.8)
Platelets: 250 10*3/uL (ref 145–400)
RBC: 4.26 10*6/uL (ref 3.70–5.45)
WBC: 5.8 10*3/uL (ref 3.9–10.3)

## 2009-11-06 LAB — COMPREHENSIVE METABOLIC PANEL
Alkaline Phosphatase: 60 U/L (ref 39–117)
BUN: 13 mg/dL (ref 6–23)
CO2: 33 mEq/L — ABNORMAL HIGH (ref 19–32)
Creatinine, Ser: 0.88 mg/dL (ref 0.40–1.20)
Glucose, Bld: 89 mg/dL (ref 70–99)
Total Bilirubin: 0.3 mg/dL (ref 0.3–1.2)
Total Protein: 7.3 g/dL (ref 6.0–8.3)

## 2009-11-16 ENCOUNTER — Telehealth: Payer: Self-pay | Admitting: Internal Medicine

## 2010-01-25 ENCOUNTER — Ambulatory Visit: Payer: Self-pay | Admitting: Internal Medicine

## 2010-01-25 LAB — CONVERTED CEMR LAB
ALT: 15 units/L (ref 0–35)
AST: 19 units/L (ref 0–37)
Albumin: 3.8 g/dL (ref 3.5–5.2)
Alkaline Phosphatase: 69 units/L (ref 39–117)
BUN: 14 mg/dL (ref 6–23)
Basophils Absolute: 0.1 10*3/uL (ref 0.0–0.1)
CO2: 33 meq/L — ABNORMAL HIGH (ref 19–32)
Eosinophils Absolute: 0.2 10*3/uL (ref 0.0–0.7)
GFR calc non Af Amer: 91.24 mL/min (ref >60)
Glucose, Bld: 94 mg/dL (ref 70–99)
Hemoglobin: 13 g/dL (ref 12.0–15.0)
Lymphocytes Relative: 55.8 % — ABNORMAL HIGH (ref 12.0–46.0)
MCHC: 33.5 g/dL (ref 30.0–36.0)
Monocytes Relative: 7.8 % (ref 3.0–12.0)
Neutro Abs: 1.6 10*3/uL (ref 1.4–7.7)
Neutrophils Relative %: 32 % — ABNORMAL LOW (ref 43.0–77.0)
Platelets: 278 10*3/uL (ref 150.0–400.0)
Potassium: 3.6 meq/L (ref 3.5–5.1)
RDW: 14.8 % — ABNORMAL HIGH (ref 11.5–14.6)
Sodium: 142 meq/L (ref 135–145)
TSH: 1.89 microintl units/mL (ref 0.35–5.50)
Total Protein: 7.4 g/dL (ref 6.0–8.3)
Vitamin B-12: 506 pg/mL (ref 211–911)

## 2010-01-31 ENCOUNTER — Ambulatory Visit: Payer: Self-pay | Admitting: Internal Medicine

## 2010-01-31 DIAGNOSIS — Z85828 Personal history of other malignant neoplasm of skin: Secondary | ICD-10-CM

## 2010-02-05 ENCOUNTER — Ambulatory Visit: Payer: Self-pay | Admitting: Oncology

## 2010-02-07 ENCOUNTER — Encounter: Payer: Self-pay | Admitting: Internal Medicine

## 2010-02-07 LAB — CBC WITH DIFFERENTIAL/PLATELET
Basophils Absolute: 0 10*3/uL (ref 0.0–0.1)
Eosinophils Absolute: 0.1 10*3/uL (ref 0.0–0.5)
HGB: 12.9 g/dL (ref 11.6–15.9)
MCV: 85.2 fL (ref 79.5–101.0)
MONO#: 0.4 10*3/uL (ref 0.1–0.9)
MONO%: 8.5 % (ref 0.0–14.0)
NEUT#: 1.3 10*3/uL — ABNORMAL LOW (ref 1.5–6.5)
Platelets: 261 10*3/uL (ref 145–400)
RDW: 14.7 % — ABNORMAL HIGH (ref 11.2–14.5)
WBC: 4.9 10*3/uL (ref 3.9–10.3)

## 2010-05-09 ENCOUNTER — Ambulatory Visit: Payer: Self-pay | Admitting: Internal Medicine

## 2010-05-16 ENCOUNTER — Ambulatory Visit: Payer: Self-pay | Admitting: Oncology

## 2010-05-16 LAB — COMPREHENSIVE METABOLIC PANEL
AST: 16 U/L (ref 0–37)
Alkaline Phosphatase: 70 U/L (ref 39–117)
BUN: 13 mg/dL (ref 6–23)
Calcium: 9.5 mg/dL (ref 8.4–10.5)
Creatinine, Ser: 0.91 mg/dL (ref 0.40–1.20)
Glucose, Bld: 90 mg/dL (ref 70–99)

## 2010-05-16 LAB — CBC WITH DIFFERENTIAL/PLATELET
Basophils Absolute: 0 10*3/uL (ref 0.0–0.1)
EOS%: 2.3 % (ref 0.0–7.0)
HCT: 37.2 % (ref 34.8–46.6)
HGB: 12.5 g/dL (ref 11.6–15.9)
MCH: 29 pg (ref 25.1–34.0)
MCV: 86.5 fL (ref 79.5–101.0)
MONO%: 8.7 % (ref 0.0–14.0)
NEUT%: 34.8 % — ABNORMAL LOW (ref 38.4–76.8)
Platelets: 285 10*3/uL (ref 145–400)

## 2010-05-23 ENCOUNTER — Encounter: Payer: Self-pay | Admitting: Internal Medicine

## 2010-06-08 ENCOUNTER — Ambulatory Visit: Payer: Self-pay | Admitting: Family Medicine

## 2010-06-08 ENCOUNTER — Telehealth: Payer: Self-pay | Admitting: Family Medicine

## 2010-06-10 ENCOUNTER — Ambulatory Visit: Payer: Self-pay | Admitting: Internal Medicine

## 2010-06-10 ENCOUNTER — Telehealth: Payer: Self-pay | Admitting: Internal Medicine

## 2010-06-10 LAB — CONVERTED CEMR LAB
AST: 18 units/L (ref 0–37)
Albumin: 3.9 g/dL (ref 3.5–5.2)
Alkaline Phosphatase: 62 units/L (ref 39–117)
BUN: 14 mg/dL (ref 6–23)
Basophils Absolute: 0.1 10*3/uL (ref 0.0–0.1)
Bilirubin Urine: NEGATIVE
Chloride: 99 meq/L (ref 96–112)
Eosinophils Relative: 1.9 % (ref 0.0–5.0)
GFR calc non Af Amer: 91.15 mL/min (ref >60)
HCT: 39 % (ref 36.0–46.0)
Hemoglobin: 13 g/dL (ref 12.0–15.0)
Ketones, ur: NEGATIVE mg/dL
Lymphs Abs: 4.2 10*3/uL — ABNORMAL HIGH (ref 0.7–4.0)
MCV: 87.5 fL (ref 78.0–100.0)
Monocytes Absolute: 0.5 10*3/uL (ref 0.1–1.0)
Monocytes Relative: 7.6 % (ref 3.0–12.0)
Neutro Abs: 1.9 10*3/uL (ref 1.4–7.7)
Platelets: 275 10*3/uL (ref 150.0–400.0)
Potassium: 3.4 meq/L — ABNORMAL LOW (ref 3.5–5.1)
RDW: 14.2 % (ref 11.5–14.6)
Sed Rate: 22 mm/hr (ref 0–22)
Sodium: 140 meq/L (ref 135–145)
Total Bilirubin: 0.4 mg/dL (ref 0.3–1.2)
Total Protein, Urine: NEGATIVE mg/dL
pH: 6 (ref 5.0–8.0)

## 2010-06-17 ENCOUNTER — Ambulatory Visit: Payer: Self-pay | Admitting: Cardiology

## 2010-06-26 ENCOUNTER — Telehealth (INDEPENDENT_AMBULATORY_CARE_PROVIDER_SITE_OTHER): Payer: Self-pay | Admitting: *Deleted

## 2010-06-27 ENCOUNTER — Ambulatory Visit: Payer: Self-pay | Admitting: Internal Medicine

## 2010-06-27 DIAGNOSIS — K219 Gastro-esophageal reflux disease without esophagitis: Secondary | ICD-10-CM

## 2010-07-11 ENCOUNTER — Telehealth: Payer: Self-pay | Admitting: Internal Medicine

## 2010-08-02 ENCOUNTER — Ambulatory Visit: Payer: Self-pay | Admitting: Internal Medicine

## 2010-08-08 LAB — CONVERTED CEMR LAB
AST: 20 units/L (ref 0–37)
Alkaline Phosphatase: 62 units/L (ref 39–117)
Bilirubin, Direct: 0.1 mg/dL (ref 0.0–0.3)
GFR calc non Af Amer: 91.11 mL/min (ref >60.00)
Glucose, Bld: 95 mg/dL (ref 70–99)
Potassium: 3.4 meq/L — ABNORMAL LOW (ref 3.5–5.1)
Sodium: 139 meq/L (ref 135–145)
Total Bilirubin: 0.3 mg/dL (ref 0.3–1.2)

## 2010-08-09 ENCOUNTER — Encounter: Payer: Self-pay | Admitting: Gastroenterology

## 2010-08-09 ENCOUNTER — Ambulatory Visit: Payer: Self-pay | Admitting: Internal Medicine

## 2010-08-13 ENCOUNTER — Telehealth: Payer: Self-pay | Admitting: Internal Medicine

## 2010-08-13 ENCOUNTER — Telehealth: Payer: Self-pay | Admitting: Gastroenterology

## 2010-08-14 ENCOUNTER — Ambulatory Visit: Payer: Self-pay | Admitting: Gastroenterology

## 2010-08-14 DIAGNOSIS — R1013 Epigastric pain: Secondary | ICD-10-CM | POA: Insufficient documentation

## 2010-09-20 ENCOUNTER — Ambulatory Visit
Admission: RE | Admit: 2010-09-20 | Discharge: 2010-09-20 | Payer: Self-pay | Source: Home / Self Care | Attending: Gastroenterology | Admitting: Gastroenterology

## 2010-09-20 ENCOUNTER — Other Ambulatory Visit: Payer: Self-pay | Admitting: Gastroenterology

## 2010-09-22 ENCOUNTER — Encounter: Payer: Self-pay | Admitting: General Surgery

## 2010-09-23 ENCOUNTER — Telehealth: Payer: Self-pay | Admitting: Gastroenterology

## 2010-09-24 ENCOUNTER — Ambulatory Visit: Admit: 2010-09-24 | Payer: Self-pay | Admitting: Gastroenterology

## 2010-09-26 ENCOUNTER — Encounter: Payer: Self-pay | Admitting: Gastroenterology

## 2010-09-27 ENCOUNTER — Telehealth: Payer: Self-pay | Admitting: Gastroenterology

## 2010-10-03 NOTE — Assessment & Plan Note (Signed)
Summary: SWELLING ANKLES--STC   Vital Signs:  Patient profile:   77 year old female Weight:      222 pounds Temp:     98.7 degrees F oral Pulse rate:   72 / minute BP sitting:   136 / 90  (right arm)  Vitals Entered By: Tora Perches (October 24, 2009 2:24 PM) CC: leg and ankle swelling Is Patient Diabetic? No   Primary Care Provider:  ALEX PLOTNIKOV  CC:  leg and ankle swelling.  History of Present Illness: The patient presents for a follow up of back pain, anxiety,HTN. C/o rash on face, OA in B LEs  Preventive Screening-Counseling & Management  Alcohol-Tobacco     Smoking Status: never  Current Medications (verified): 1)  Klor-Con M20 20 Meq  Tbcr (Potassium Chloride Crys Cr) .Marland Kitchen.. 1 Two or 3  Times A Day 2)  Exforge 10-320 Mg  Tabs (Amlodipine Besylate-Valsartan) .Marland Kitchen.. 1 Once Daily 3)  Vitamin D3 1000 Unit  Tabs (Cholecalciferol) .Marland Kitchen.. 1 Qd 4)  Lotrisone 1-0.05 % Crea (Clotrimazole-Betamethasone) .... Use Two Times A Day 5)  Vitamin B-12 Cr 1000 Mcg  Tbcr (Cyanocobalamin) .Marland Kitchen.. 1 By Mouth Daily 6)  Fluoxetine Hcl 10 Mg  Caps (Fluoxetine Hcl) .... Take 1 Capsule By Mouth Daily 7)  Ibuprofen 600 Mg  Tabs (Ibuprofen) .Marland Kitchen.. 1 By Mouth Two Times A Day Pc X 1 Wk, Then As Needed Pain 8)  Hydrocodone-Acetaminophen 5-325 Mg Tabs (Hydrocodone-Acetaminophen) .Marland Kitchen.. 1-2 Every 6hrs 9)  Vitamin D [address]109 Unit Caps (Ergocalciferol) .Marland Kitchen.. 1 Pill Q Week X6 Weeks 10)  Arimidex 1 Mg Tabs (Anastrozole) .... Once Daily 11)  Advil 200 Mg Tabs (Ibuprofen) .... As Needed 12)  Tramadol Hcl 50 Mg Tabs (Tramadol Hcl) .Marland Kitchen.. 1-2 Tabs By Mouth Two Times A Day As Needed Pain 13)  Triamcinolone Acetonide 0.5 % Crea (Triamcinolone Acetonide) .... Use Two Times A Day Prn  Allergies: 1)  ! Cipro 2)  ! Asa 3)  ! Codeine 4)  ! * Hctz 5)  Femara (Letrozole)  Past History:  Past Medical History: Last updated: 10/04/2008 Anemia-iron deficiency Diverticulosis, colon Dr Arlyce Dice; Colon  2004 Hyperlipidemia Hypertension Vit D def Vit B12 def H/o hematuria Dr Vonita Moss GYN  Breast cancer, hx of  L Depression, grief 2009 Osteoarthritis, knees Breast CA R 2009  Past Surgical History: Last updated: 10/04/2008 TKR 06/04/98 Mastectomy L; R 09/13/2008  Social History: Last updated: 08/15/2008 Retired  Never Smoked Alcohol use-no Widow/Widower 2009  Review of Systems  The patient denies fever, chest pain, dyspnea on exertion, and abdominal pain.    Physical Exam  General:  NAD overweight-appearing.   Nose:  External nasal examination shows no deformity or inflammation. Nasal mucosa are pink and moist without lesions or exudates. Mouth:  Erythematous throat mucosa and intranasal erythema.  Lungs:  Normal respiratory effort, chest expands symmetrically. Lungs are clear to auscultation, no crackles or wheezes. Heart:  Normal rate and regular rhythm. S1 and S2 normal without gallop, murmur, click, rub or other extra sounds. Abdomen:  Bowel sounds positive,abdomen soft and non-tender without masses, organomegaly or hernias noted. Msk:  B knees, B feet a little tender w/ROM - better Walks w/a cane Neurologic:  No cranial nerve deficits noted. Station and gait are normal. Plantar reflexes are down-going bilaterally. DTRs are symmetrical throughout. Sensory, motor and coordinative functions appear intact. Skin:  erythemat faint hyperpigm rash on cheeks Psych:  Oriented X3, memory intact for recent and remote, normally interactive, and good eye contact.  Impression & Recommendations:  Problem # 1:  RASH AND OTHER NONSPECIFIC SKIN ERUPTION (ICD-782.1) face Assessment New Likely due to Arimidex Her updated medication list for this problem includes:    Lotrisone 1-0.05 % Crea (Clotrimazole-betamethasone) ..... Use two times a day    Triamcinolone Acetonide 0.5 % Crea (Triamcinolone acetonide) ..... Use two times a day prn  Problem # 2:  LEG PAIN  (ICD-729.5) Assessment: Unchanged Likely due to Arimidex  Problem # 3:  HYPERTENSION (ICD-401.9)  Her updated medication list for this problem includes:    Exforge 10-320 Mg Tabs (Amlodipine besylate-valsartan) .Marland Kitchen... 1 once daily  Problem # 4:  B12 DEFICIENCY (ICD-266.2) Assessment: Unchanged On prescription therapy   Complete Medication List: 1)  Klor-con M20 20 Meq Tbcr (Potassium chloride crys cr) .Marland Kitchen.. 1 two or 3  times a day 2)  Exforge 10-320 Mg Tabs (Amlodipine besylate-valsartan) .Marland Kitchen.. 1 once daily 3)  Vitamin D3 1000 Unit Tabs (Cholecalciferol) .Marland Kitchen.. 1 qd 4)  Lotrisone 1-0.05 % Crea (Clotrimazole-betamethasone) .... Use two times a day 5)  Vitamin B-12 Cr 1000 Mcg Tbcr (Cyanocobalamin) .Marland Kitchen.. 1 by mouth daily 6)  Fluoxetine Hcl 10 Mg Caps (Fluoxetine hcl) .... Take 1 capsule by mouth daily 7)  Ibuprofen 600 Mg Tabs (Ibuprofen) .Marland Kitchen.. 1 by mouth two times a day pc x 1 wk, then as needed pain 8)  Hydrocodone-acetaminophen 5-325 Mg Tabs (Hydrocodone-acetaminophen) .Marland Kitchen.. 1-2 every 6hrs 9)  Vitamin D 04540 Unit Caps (Ergocalciferol) .Marland Kitchen.. 1 pill q week x6 weeks 10)  Arimidex 1 Mg Tabs (Anastrozole) .... Once daily 11)  Advil 200 Mg Tabs (Ibuprofen) .... As needed 12)  Tramadol Hcl 50 Mg Tabs (Tramadol hcl) .Marland Kitchen.. 1-2 tabs by mouth two times a day as needed pain 13)  Triamcinolone Acetonide 0.5 % Crea (Triamcinolone acetonide) .... Use two times a day prn  Other Orders: Pneumococcal Vaccine (98119) Admin 1st Vaccine (14782) Admin 1st Vaccine [facility]) [phone])  Patient Instructions: 1)  Please schedule a follow-up appointment in 3 months. 2)  BMP prior to visit, ICD-9: 3)  Hepatic Panel prior to visit, ICD-9: 4)  TSH prior to visit, ICD-9: 5)  CBC w/ Diff prior to visit, ICD-9: 6)  Vit B12 266.20 401.1   Pneumovax Vaccine    Vaccine Type: Pneumovax    Site: right deltoid    Mfr: Merck    Dose: 0.5 ml    Route: IM    Given by: Tora Perches    Exp. Date: 12/20/2010    Lot #:  [id number]    VIS given: 03/29/96 version given October 24, 2009.

## 2010-10-03 NOTE — Progress Notes (Signed)
Summary: Stomach problems  Phone Note Call from Patient Call back at Home Phone [phone]   Summary of Call: Pt continues to have "Stomach problems", she completed the medications given by MD. Please advise.  Initial call taken by: Lamar Sprinkles, CMA,  June 26, 2010 9:32 AM  Follow-up for Phone Call        ov pls Follow-up by: Tresa Garter MD,  June 26, 2010 2:24 PM  Additional Follow-up for Phone Call Additional follow up Details #1::        Ok to wk in?  Additional Follow-up by: Lamar Sprinkles, CMA,  June 26, 2010 2:29 PM    Additional Follow-up for Phone Call Additional follow up Details #2::    tomorrow ok Thank you!  Follow-up by: Tresa Garter MD,  June 26, 2010 2:32 PM  Additional Follow-up for Phone Call Additional follow up Details #3:: Details for Additional Follow-up Action Taken: Appt 10/27@11 :15 am. Additional Follow-up by: Verdell Face,  June 26, 2010 2:53 PM

## 2010-10-03 NOTE — Assessment & Plan Note (Signed)
Summary: Abdominal Pain/LRH   History of Present Illness Visit Type: Initial Consult Primary GI MD: Melvia Heaps MD [facility] Primary Adilee Lemme: Jacinta Shoe, MD Requesting Laurajean Hosek: Jacinta Shoe, MD Chief Complaint: Upper abd pain x 2-3 months with worsening symptoms in the morning and after meals. Pt has a lot of belching and gas after meals.  History of Present Illness:   Ms. [name] [name] is a pleasant 77 year old African American female referred at the request of Dr. Posey Rea for evaluation of abdominal pain.  For the past 2-3 months she has been complaining of postprandial discomfort in the mid epigastrium and periumbilical areas.  Symptoms are often worse in the early morning.  She complains of excess belching and gas postprandially.  She had been taking indomethacin regularly and then switched  to Advil.  She discontinued this over a month ago and was placed on omeprazole in late October.  Symptoms have improved though they remain.  She denies change in bowel habits, melena, hematochezia or pyrosis.   GI Review of Systems    Reports abdominal pain, acid reflux, belching, and  bloating.     Location of  Abdominal pain: upper abdomen.    Denies chest pain, dysphagia with liquids, dysphagia with solids, heartburn, loss of appetite, nausea, vomiting, vomiting blood, weight loss, and  weight gain.        Denies anal fissure, black tarry stools, change in bowel habit, constipation, diarrhea, diverticulosis, fecal incontinence, heme positive stool, hemorrhoids, irritable bowel syndrome, jaundice, light color stool, liver problems, rectal bleeding, and  rectal pain.    Current Medications (verified): 1)  Klor-Con M20 20 Meq  Tbcr (Potassium Chloride Crys Cr) .Marland Kitchen.. 1 Two or 3  Times A Day 2)  Exforge 10-320 Mg  Tabs (Amlodipine Besylate-Valsartan) .Marland Kitchen.. 1 Once Daily 3)  Vitamin D3 1000 Unit  Tabs (Cholecalciferol) .Marland Kitchen.. 1 Qd 4)  Lotrisone 1-0.05 % Crea (Clotrimazole-Betamethasone) .... Use Two  Times A Day 5)  Vitamin B-12 Cr 1000 Mcg  Tbcr (Cyanocobalamin) .Marland Kitchen.. 1 By Mouth Daily 6)  Fluoxetine Hcl 10 Mg  Caps (Fluoxetine Hcl) .... Take 1 Capsule By Mouth Daily 7)  Ibuprofen 600 Mg  Tabs (Ibuprofen) .Marland Kitchen.. 1 By Mouth Two Times A Day Pc X 1 Wk, Then As Needed Pain 8)  Hydrocodone-Acetaminophen 5-325 Mg Tabs (Hydrocodone-Acetaminophen) .Marland Kitchen.. 1-2 Every 6hrs 9)  Vitamin D 16109 Unit Caps (Ergocalciferol) .Marland Kitchen.. 1 Pill Q Week X6 Weeks 10)  Tramadol Hcl 50 Mg Tabs (Tramadol Hcl) .Marland Kitchen.. 1-2 Tabs By Mouth Two Times A Day As Needed Pain 11)  Triamcinolone Acetonide 0.5 % Crea (Triamcinolone Acetonide) .... Use Two Times A Day Prn 12)  Loratadine 10 Mg Tabs (Loratadine) .Marland Kitchen.. 1 By Mouth Once Daily As Needed Allergies 13)  Omeprazole 40 Mg Cpdr (Omeprazole) .Marland Kitchen.. 1 By Mouth Qam For Indigestion 14)  Align  Caps (Probiotic Product) .Marland Kitchen.. 1 By Mouth Once Daily For Your Intesinal Flora Restoraion  Allergies (verified): 1)  ! Cipro 2)  ! Asa 3)  ! Codeine 4)  ! * Hctz 5)  Femara (Letrozole) 6)  Arimidex (Anastrozole)  Past History:  Past Medical History: Reviewed history from 06/27/2010 and no changes required. Anemia-iron deficiency Diverticulosis, colon Dr Arlyce Dice; Colon 2004 Hyperlipidemia Hypertension Vit D def Vit B12 def H/o hematuria Dr Vonita Moss GYN  Breast cancer, hx of  L Depression, grief 2009 Osteoarthritis, knees Breast CA R 2009 Skin cancer, hx of 2011 GERD  Past Surgical History: Reviewed history from 01/31/2010 and no changes required. TKR 06/04/98 Mastectomy  L; R 09/13/2008 L groin skin ca 2011  Family History: Family History of CAD Female 1st degree relative <60 Family History of CAD Female 1st degree relative <50 Family History of Breast Cancer:Aunt, Neice, Sister  Social History: Reviewed history from 08/15/2008 and no changes required. Retired  Never Smoked Alcohol use-no Widow/Widower 2009  Review of Systems       The patient complains of arthritis/joint  pain, night sweats, and sleeping problems.  The patient denies allergy/sinus, anemia, anxiety-new, back pain, blood in urine, breast changes/lumps, change in vision, confusion, cough, coughing up blood, depression-new, fainting, fatigue, fever, headaches-new, hearing problems, heart murmur, heart rhythm changes, itching, menstrual pain, muscle pains/cramps, nosebleeds, pregnancy symptoms, shortness of breath, skin rash, sore throat, swelling of feet/legs, swollen lymph glands, thirst - excessive , urination - excessive , urination changes/pain, urine leakage, vision changes, and voice change.         All other systems were reviewed and were negative   Vital Signs:  Patient profile:   77 year old female Height:      62 inches Weight:      2126 pounds BMI:     390.26 Pulse rate:   60 / minute Pulse rhythm:   regular BP sitting:   114 / 68  (right arm) Cuff size:   large  Vitals Entered By: Christie Nottingham CMA Duncan Dull) (August 14, 2010 9:44 AM)  Physical Exam  Additional Exam:  On physical exam she is an obese female  skin: anicteric HEENT: normocephalic; PEERLA; no nasal or pharyngeal abnormalities neck: supple nodes: no cervical lymphadenopathy chest: clear to ausculatation and percussion heart: no murmurs, gallops, or rubs abd: soft, nontender; BS normoactive; no abdominal masses, tenderness, organomegaly rectal: deferred ext: no cynanosis, clubbing, edema skeletal: no deformities neuro: oriented x 3; no focal abnormalities    Impression & Recommendations:  Problem # 1:  ABDOMINAL PAIN, EPIGASTRIC (ICD-789.06)  Symptoms could be related to prior NSAID use.  She may have ulcer or nonulcer dyspepsia.  Recommendations #1 hold all NSAIDs #2 upper endoscopy #3 continue omeprazole for now  Orders: EGD (EGD)  Problem # 2:  OSTEOARTHRITIS (ICD-715.90) Assessment: Comment Only  Patient Instructions: 1)  Copy sent to : Jacinta Shoe, MD 2)  Your EGD is scheduled on  09/20/2010 at 2pm 3)  Hold INSAIDS 4)  Continue Omeprazole 5)  Conscious Sedation brochure given.  6)  Upper Endoscopy brochure given.  7)  The medication list was reviewed and reconciled.  All changed / newly prescribed medications were explained.  A complete medication list was provided to the patient / caregiver.

## 2010-10-03 NOTE — Assessment & Plan Note (Signed)
Summary: 3 MO ROV /NWS #   Vital Signs:  Patient profile:   77 year old female Height:      62 inches Weight:      212 pounds BMI:     38.92 Temp:     98.0 degrees F oral Pulse rate:   64 / minute Pulse rhythm:   regular Resp:     16 per minute BP sitting:   140 / 90  (right arm) Cuff size:   large  Vitals Entered By: Lanier Prude, CMA(AAMA) (August 09, 2010 8:22 AM) CC: 3 mo f/u still c/o GI issues Is Patient Diabetic? No Comments pt states she could not take flagyl because it hurt her stomach   Primary Care Provider:  ALEX PLOTNIKOV  CC:  3 mo f/u still c/o GI issues.  History of Present Illness: C/o abd pain and gas (burping) after eating x months, better with drinking milk. No diarrhea. CT was OK  Current Medications (verified): 1)  Klor-Con M20 20 Meq  Tbcr (Potassium Chloride Crys Cr) .Marland Kitchen.. 1 Two or 3  Times A Day 2)  Exforge 10-320 Mg  Tabs (Amlodipine Besylate-Valsartan) .Marland Kitchen.. 1 Once Daily 3)  Vitamin D3 1000 Unit  Tabs (Cholecalciferol) .Marland Kitchen.. 1 Qd 4)  Lotrisone 1-0.05 % Crea (Clotrimazole-Betamethasone) .... Use Two Times A Day 5)  Vitamin B-12 Cr 1000 Mcg  Tbcr (Cyanocobalamin) .Marland Kitchen.. 1 By Mouth Daily 6)  Fluoxetine Hcl 10 Mg  Caps (Fluoxetine Hcl) .... Take 1 Capsule By Mouth Daily 7)  Ibuprofen 600 Mg  Tabs (Ibuprofen) .Marland Kitchen.. 1 By Mouth Two Times A Day Pc X 1 Wk, Then As Needed Pain 8)  Hydrocodone-Acetaminophen 5-325 Mg Tabs (Hydrocodone-Acetaminophen) .Marland Kitchen.. 1-2 Every 6hrs 9)  Vitamin D 04540 Unit Caps (Ergocalciferol) .Marland Kitchen.. 1 Pill Q Week X6 Weeks 10)  Advil 200 Mg Tabs (Ibuprofen) .... As Needed 11)  Tramadol Hcl 50 Mg Tabs (Tramadol Hcl) .Marland Kitchen.. 1-2 Tabs By Mouth Two Times A Day As Needed Pain 12)  Triamcinolone Acetonide 0.5 % Crea (Triamcinolone Acetonide) .... Use Two Times A Day Prn 13)  Gabapentin 100 Mg Caps (Gabapentin) .Marland Kitchen.. 1-2 By Mouth At Bedtime As Needed Numbness 14)  Loratadine 10 Mg Tabs (Loratadine) .Marland Kitchen.. 1 By Mouth Once Daily As Needed Allergies 15)   Omeprazole 40 Mg Cpdr (Omeprazole) .Marland Kitchen.. 1 By Mouth Qam For Indigestion 16)  Align  Caps (Probiotic Product) .Marland Kitchen.. 1 By Mouth Once Daily For Your Intesinal Flora Restoraion  Allergies (verified): 1)  ! Cipro 2)  ! Asa 3)  ! Codeine 4)  ! * Hctz 5)  Femara (Letrozole) 6)  Arimidex (Anastrozole)  Past History:  Past Medical History: Last updated: 06/27/2010 Anemia-iron deficiency Diverticulosis, colon Dr Arlyce Dice; Colon 2004 Hyperlipidemia Hypertension Vit D def Vit B12 def H/o hematuria Dr Vonita Moss GYN  Breast cancer, hx of  L Depression, grief 2009 Osteoarthritis, knees Breast CA R 2009 Skin cancer, hx of 2011 GERD  Past Surgical History: Last updated: 01/31/2010 TKR 06/04/98 Mastectomy L; R 09/13/2008 L groin skin ca 2011  Family History: Last updated: 06/21/2007 Family History of CAD Female 1st degree relative <60 Family History of CAD Female 1st degree relative <50  Social History: Last updated: 08/15/2008 Retired  Never Smoked Alcohol use-no Widow/Widower 2009  Review of Systems       The patient complains of abdominal pain.  The patient denies fever, weight loss, chest pain, dyspnea on exertion, melena, hematochezia, and severe indigestion/heartburn.    Physical Exam  General:  overweight but generally well appearing  Nose:  External nasal examination shows no deformity or inflammation. Nasal mucosa are pink and moist without lesions or exudates. Mouth:  pharynx pink and moist.   Lungs:  Normal respiratory effort, chest expands symmetrically. Lungs are clear to auscultation, no crackles or wheezes. Heart:  Normal rate and regular rhythm. S1 and S2 normal without gallop, murmur, click, rub or other extra sounds. Abdomen:  mild to mod  tenderness bilat lq without rebound or gaurding  a little worse on R normal bowel sounds, no distention, no masses, no hepatomegaly, and no splenomegaly.   Msk:  no CVA tenderness  no lumbar spinous tenderness  Neurologic:   No cranial nerve deficits noted. Station and gait are normal. Plantar reflexes are down-going bilaterally. DTRs are symmetrical throughout. Sensory, motor and coordinative functions appear intact. Skin:  Intact without suspicious lesions or rashes nl color and turgor with nl capillary refil  Psych:  normal affect, talkative and pleasant    Impression & Recommendations:  Problem # 1:  ABDOMINAL PAIN, LOWER (ICD-789.09) Assessment Unchanged  Her updated medication list for this problem includes:    Ibuprofen 600 Mg Tabs (Ibuprofen) .Marland Kitchen... 1 by mouth two times a day pc x 1 wk, then as needed pain    Hydrocodone-acetaminophen 5-325 Mg Tabs (Hydrocodone-acetaminophen) .Marland Kitchen... 1-2 every 6hrs    Advil 200 Mg Tabs (Ibuprofen) .Marland Kitchen... As needed    Tramadol Hcl 50 Mg Tabs (Tramadol hcl) .Marland Kitchen... 1-2 tabs by mouth two times a day as needed pain  Orders: Gastroenterology Referral (GI) Dr Arlyce Dice  Problem # 2:  GERD (ICD-530.81) Assessment: Improved  Her updated medication list for this problem includes:    Omeprazole 40 Mg Cpdr (Omeprazole) .Marland Kitchen... 1 by mouth qam for indigestion  Orders: Gastroenterology Referral (GI)  Problem # 3:  NAUSEA ALONE (ICD-787.02)/burping Assessment: Unchanged As above  Problem # 4:  HYPERTENSION (ICD-401.9) Assessment: Unchanged  Her updated medication list for this problem includes:    Exforge 10-320 Mg Tabs (Amlodipine besylate-valsartan) .Marland Kitchen... 1 once daily  Complete Medication List: 1)  Klor-con M20 20 Meq Tbcr (Potassium chloride crys cr) .Marland Kitchen.. 1 two or 3  times a day 2)  Exforge 10-320 Mg Tabs (Amlodipine besylate-valsartan) .Marland Kitchen.. 1 once daily 3)  Vitamin D3 1000 Unit Tabs (Cholecalciferol) .Marland Kitchen.. 1 qd 4)  Lotrisone 1-0.05 % Crea (Clotrimazole-betamethasone) .... Use two times a day 5)  Vitamin B-12 Cr 1000 Mcg Tbcr (Cyanocobalamin) .Marland Kitchen.. 1 by mouth daily 6)  Fluoxetine Hcl 10 Mg Caps (Fluoxetine hcl) .... Take 1 capsule by mouth daily 7)  Ibuprofen 600 Mg Tabs  (Ibuprofen) .Marland Kitchen.. 1 by mouth two times a day pc x 1 wk, then as needed pain 8)  Hydrocodone-acetaminophen 5-325 Mg Tabs (Hydrocodone-acetaminophen) .Marland Kitchen.. 1-2 every 6hrs 9)  Vitamin D 81191 Unit Caps (Ergocalciferol) .Marland Kitchen.. 1 pill q week x6 weeks 10)  Advil 200 Mg Tabs (Ibuprofen) .... As needed 11)  Tramadol Hcl 50 Mg Tabs (Tramadol hcl) .Marland Kitchen.. 1-2 tabs by mouth two times a day as needed pain 12)  Triamcinolone Acetonide 0.5 % Crea (Triamcinolone acetonide) .... Use two times a day prn 13)  Gabapentin 100 Mg Caps (Gabapentin) .Marland Kitchen.. 1-2 by mouth at bedtime as needed numbness 14)  Loratadine 10 Mg Tabs (Loratadine) .Marland Kitchen.. 1 by mouth once daily as needed allergies 15)  Omeprazole 40 Mg Cpdr (Omeprazole) .Marland Kitchen.. 1 by mouth qam for indigestion 16)  Align Caps (Probiotic product) .Marland Kitchen.. 1 by mouth once daily for your intesinal flora  restoraion  Patient Instructions: 1)  Please schedule a follow-up appointment in 3 months.   Orders Added: 1)  Est. Patient Level IV [98119] 2)  Gastroenterology Referral [GI]

## 2010-10-03 NOTE — Progress Notes (Signed)
Summary: FYI  Phone Note Call from Patient   Summary of Call: FYI: Pt was diagnosed with skin cancer.  Initial call taken by: Lamar Sprinkles, CMA,  November 16, 2009 11:08 AM  Follow-up for Phone Call        noted Thx Follow-up by: Tresa Garter MD,  November 16, 2009 5:53 PM

## 2010-10-03 NOTE — Progress Notes (Signed)
Summary: SORE THROAT  Phone Note Call from Patient   Summary of Call: Pt continues to c/o a sore throat. She would like to know what to do now.  Initial call taken by: Lamar Sprinkles, CMA,  September 27, 2009 4:03 PM  Follow-up for Phone Call        OV pls - any MD Follow-up by: Tresa Garter MD,  September 28, 2009 7:46 AM  Additional Follow-up for Phone Call Additional follow up Details #1::        left mess to call office back.......................Marland KitchenLamar Sprinkles, CMA  September 28, 2009 2:37 PM   Pt used salt water gargle and feels better........................Marland KitchenLamar Sprinkles, CMA  October 01, 2009 2:48 PM

## 2010-10-03 NOTE — Letter (Signed)
Summary: New Patient letter  [facility]  [address] [place], [place] [address]   Phone: [phone]  Fax: [phone]       08/09/2010 MRN: [id number]  [facility] [address] [place], [place]  [address]  Dear Ms. Vought,  Welcome to the Gastroenterology Division at [facility].    You are scheduled to see Dr.  Arlyce Dice on 09-24-10 at 10am on the 3rd floor at [facility], [address].  We ask that you try to arrive at our office 15 minutes prior to your appointment time to allow for check-in.  We would like you to complete the enclosed self-administered evaluation form prior to your visit and bring it with you on the day of your appointment.  We will review it with you.  Also, please bring a complete list of all your medications or, if you prefer, bring the medication bottles and we will list them.  Please bring your insurance card so that we may make a copy of it.  If your insurance requires a referral to see a specialist, please bring your referral form from your primary care physician.  Co-payments are due at the time of your visit and may be paid by cash, check or credit card.     Your office visit will consist of a consult with your physician (includes a physical exam), any laboratory testing he/she may order, scheduling of any necessary diagnostic testing (e.g. x-ray, ultrasound, CT-scan), and scheduling of a procedure (e.g. Endoscopy, Colonoscopy) if required.  Please allow enough time on your schedule to allow for any/all of these possibilities.    If you cannot keep your appointment, please call [phone] to cancel or reschedule prior to your appointment date.  This allows [place] the opportunity to schedule an appointment for another patient in need of care.  If you do not cancel or reschedule by 5 p.m. the business day prior to your appointment date, you will be charged a $50.00 late cancellation/no-show fee.    Thank you for choosing [facility] for your medical needs.  We appreciate the opportunity to care for you.  Please visit [place] at our website  to learn more about our practice.                     Sincerely,                                                             The Gastroenterology Division

## 2010-10-03 NOTE — Progress Notes (Signed)
  Phone Note Other Incoming   Summary of Call: call report for stat cbc with diff  wbc is nl at 5.6 and HB is 13.4, hct 39.6 and platelet 290 overall all normal and fax will be scanned into the chart   Follow-up for Phone Call        I called pt and her symptoms were about the same  I adv her that labs were normal - which makes infection less likely  adv her to stick with clear fluid diet this weekend  re- advised that if symptoms worsened - pain or nausea or any fever -- to call and go to ER for eval she assured me she would do this if worse if not resolved monday will call her primary doctor for appt to continue evaluation   Follow-up by: Judith Part MD,  June 08, 2010 1:48 PM

## 2010-10-03 NOTE — Letter (Signed)
[facility]   Imported By: Lennie Odor 11/29/2009 16:34:08  _____________________________________________________________________  External Attachment:    Type:   Image     Comment:   External Document

## 2010-10-03 NOTE — Progress Notes (Signed)
Summary: pain med refill  Phone Note Refill Request Message from:  Fax from Pharmacy on October 29, 2009 1:52 PM  Propoxyphen-APAP [phone]  **Not on med list, please advise.  Initial call taken by: Lucious Groves,  October 29, 2009 1:53 PM  Follow-up for Phone Call        Hydrocod or Tramadol? Follow-up by: Tresa Garter MD,  October 30, 2009 1:17 PM  Additional Follow-up for Phone Call Additional follow up Details #1::        Spoke with patient and she states that this is automatic refill request and to please disregard it. Per the patient she does not need any refills at this time. Additional Follow-up by: Lucious Groves,  October 31, 2009 9:41 AM

## 2010-10-03 NOTE — Progress Notes (Signed)
Summary: Appt sooner than next avail  Phone Note From Other Clinic   Caller: Huntley Dec @ Dr Plotnikov's  office [phone] Call For: Dr Arlyce Dice Reason for Call: Schedule Patient Appt Summary of Call: Would like pt seen sooner than 1-24-11PA would be ok Initial call taken by: Leanor Kail [facility],  August 13, 2010 4:26 PM  Follow-up for Phone Call        Office given appointment with Dr. Arlyce Dice for 08/14/10 @9 :45am. Plotnikov's office to notify patient. Follow-up by: Selinda Michaels RN,  August 13, 2010 4:54 PM

## 2010-10-03 NOTE — Assessment & Plan Note (Signed)
Summary: STOMACH PROBLEMS/DLO   Vital Signs:  Patient profile:   77 year old female Height:      62 inches Weight:      205.50 pounds BMI:     37.72 O2 Sat:      97 % on Room air Temp:     97.7 degrees F oral Pulse rate:   60 / minute BP sitting:   145 / 70  (right arm) Cuff size:   regular  Vitals Entered By: Jarome Lamas (June 08, 2010 10:08 AM)  O2 Flow:  Room air CC: lower abdomen pain/pb   History of Present Illness: for the last 2 weeks - a lot of noise from stomach  past week -- pain in lower abd - both sides the same  has hx of diverticulosis found incidentally on colonosc  has been taking some medicines over the counter rolaids and pepcid and pepto bismol -none really helping   pain is achey  no worse or better with eating or bm no diarrhea or constipation  dark stool yesterday    a little nauseated without vomiting  appetite is down a little  belching a lot   colonosc in 2004 nl except for tics  never had these symptoms before   no sick contacts  no unusual foods   vitamins do have iron in them   has bm almost every time she eats  she has had weight loss -? why  has disc this with her oncologist - she is a breast cancer survivor   thanks she had her appendix out when younger      Allergies: 1)  ! Cipro 2)  ! Asa 3)  ! Codeine 4)  ! * Hctz 5)  Femara (Letrozole) 6)  Arimidex (Anastrozole)  Past History:  Past Medical History: Last updated: 01/31/2010 Anemia-iron deficiency Diverticulosis, colon Dr Arlyce Dice; Colon 2004 Hyperlipidemia Hypertension Vit D def Vit B12 def H/o hematuria Dr Vonita Moss GYN  Breast cancer, hx of  L Depression, grief 2009 Osteoarthritis, knees Breast CA R 2009 Skin cancer, hx of 2011  Past Surgical History: Last updated: 01/31/2010 TKR 06/04/98 Mastectomy L; R 09/13/2008 L groin skin ca 2011  Family History: Last updated: 06/21/2007 Family History of CAD Female 1st degree relative <60 Family  History of CAD Female 1st degree relative <50  Social History: Last updated: 08/15/2008 Retired  Never Smoked Alcohol use-no Widow/Widower 2009  Risk Factors: Smoking Status: never (10/24/2009)  Review of Systems General:  Complains of weight loss; denies chills, fatigue, fever, loss of appetite, and malaise. Eyes:  Denies blurring. CV:  Denies chest pain or discomfort, palpitations, shortness of breath with exertion, and swelling of feet. Resp:  Denies cough and shortness of breath. GI:  Complains of abdominal pain and gas; denies bloody stools, change in bowel habits, diarrhea, indigestion, nausea, and vomiting. GU:  Denies dysuria, hematuria, and urinary frequency. MS:  Denies joint pain. Derm:  Denies itching, lesion(s), poor wound healing, and rash. Neuro:  Denies numbness and tingling. Endo:  Denies cold intolerance and heat intolerance. Heme:  Denies abnormal bruising and bleeding.  Physical Exam  General:  overweight but generally well appearing  Head:  normocephalic, atraumatic, and no abnormalities observed.   Eyes:  vision grossly intact, pupils equal, pupils round, and pupils reactive to light.  no conjunctival pallor, injection or icterus  Mouth:  pharynx pink and moist.   Neck:  No deformities, masses, or tenderness noted. Lungs:  Normal respiratory effort, chest expands  symmetrically. Lungs are clear to auscultation, no crackles or wheezes. Heart:  Normal rate and regular rhythm. S1 and S2 normal without gallop, murmur, click, rub or other extra sounds. Abdomen:  mild tenderness bilat lq without rebound or gaurding  a little worse on R normal bowel sounds, no distention, no masses, no hepatomegaly, and no splenomegaly.   Rectal:  No external abnormalities noted. Normal sphincter tone. No rectal masses or tenderness. stool is heme neg and dark in color (presumed from pepto bismol) Msk:  no CVA tenderness  no lumbar spinous tenderness  Extremities:  No clubbing,  cyanosis, edema, or deformity noted with normal full range of motion of all joints.   Skin:  Intact without suspicious lesions or rashes nl color and turgor with nl capillary refil  Cervical Nodes:  No lymphadenopathy noted Inguinal Nodes:  No significant adenopathy Psych:  normal affect, talkative and pleasant    Impression & Recommendations:  Problem # 1:  ABDOMINAL PAIN, LOWER (ICD-789.09) Assessment New possible diverticulitis - but tenderness is mild and no fever  also hx of recent wt loss colonosc in 04 with tics - no other findings  heme neg stool (dark stool from pepto) sent to [facility] for stat cbc and bmp and then will contact pt and make plan Her updated medication list for this problem includes:    Ibuprofen 600 Mg Tabs (Ibuprofen) .Marland Kitchen... 1 by mouth two times a day pc x 1 wk, then as needed pain    Hydrocodone-acetaminophen 5-325 Mg Tabs (Hydrocodone-acetaminophen) .Marland Kitchen... 1-2 every 6hrs    Advil 200 Mg Tabs (Ibuprofen) .Marland Kitchen... As needed    Tramadol Hcl 50 Mg Tabs (Tramadol hcl) .Marland Kitchen... 1-2 tabs by mouth two times a day as needed pain  Orders: Venipuncture (04540) TLB-BMP (Basic Metabolic Panel-BMET) (80048-METABOL) TLB-CBC Platelet - w/Differential (85025-CBCD)  Complete Medication List: 1)  Klor-con M20 20 Meq Tbcr (Potassium chloride crys cr) .Marland Kitchen.. 1 two or 3  times a day 2)  Exforge 10-320 Mg Tabs (Amlodipine besylate-valsartan) .Marland Kitchen.. 1 once daily 3)  Vitamin D3 1000 Unit Tabs (Cholecalciferol) .Marland Kitchen.. 1 qd 4)  Lotrisone 1-0.05 % Crea (Clotrimazole-betamethasone) .... Use two times a day 5)  Vitamin B-12 Cr 1000 Mcg Tbcr (Cyanocobalamin) .Marland Kitchen.. 1 by mouth daily 6)  Fluoxetine Hcl 10 Mg Caps (Fluoxetine hcl) .... Take 1 capsule by mouth daily 7)  Ibuprofen 600 Mg Tabs (Ibuprofen) .Marland Kitchen.. 1 by mouth two times a day pc x 1 wk, then as needed pain 8)  Hydrocodone-acetaminophen 5-325 Mg Tabs (Hydrocodone-acetaminophen) .Marland Kitchen.. 1-2 every 6hrs 9)  Vitamin D 98119 Unit Caps (Ergocalciferol) .Marland Kitchen.. 1  pill q week x6 weeks 10)  Advil 200 Mg Tabs (Ibuprofen) .... As needed 11)  Tramadol Hcl 50 Mg Tabs (Tramadol hcl) .Marland Kitchen.. 1-2 tabs by mouth two times a day as needed pain 12)  Triamcinolone Acetonide 0.5 % Crea (Triamcinolone acetonide) .... Use two times a day prn 13)  Gabapentin 100 Mg Caps (Gabapentin) .Marland Kitchen.. 1-2 by mouth at bedtime as needed numbness 14)  Loratadine 10 Mg Tabs (Loratadine) .Marland Kitchen.. 1 by mouth once daily as needed allergies  Patient Instructions: 1)  please go across the street to [place] to outpatient lab to get your labs drawn  2)  if pain gets worse or vomiting or fever -- go to the ER

## 2010-10-03 NOTE — Letter (Signed)
[facility]   Imported By: Sherian Rein 03/06/2010 09:57:51  _____________________________________________________________________  External Attachment:    Type:   Image     Comment:   External Document

## 2010-10-03 NOTE — Progress Notes (Signed)
Summary: Throat problems  Phone Note Call from Patient   Summary of Call: Pt continues to have the same problem with her throat. She can not afford to be seen again and would like a rx.  Initial call taken by: Lamar Sprinkles, CMA,  September 13, 2009 10:51 AM  Follow-up for Phone Call        OK to repeat Zpac Follow-up by: Tresa Garter MD,  September 13, 2009 1:21 PM  Additional Follow-up for Phone Call Additional follow up Details #1::        Pt informed  Additional Follow-up by: Lamar Sprinkles, CMA,  September 13, 2009 3:59 PM    New/Updated Medications: ZITHROMAX Z-PAK 250 MG TABS (AZITHROMYCIN) as dirrected Prescriptions: ZITHROMAX Z-PAK 250 MG TABS (AZITHROMYCIN) as dirrected  #1 x 0   Entered and Authorized by:   Tresa Garter MD   Signed by:   Lamar Sprinkles, CMA on 09/13/2009   Method used:   [facility]        [facility]  [address] [phone]* (retail)       [address]       [place], [place]  [address]       Ph: [phone] or [phone]       Fax: [phone]   RxID:   [id number]

## 2010-10-03 NOTE — Progress Notes (Signed)
Summary: Abd Pain  Phone Note Call from Patient Call back at Home Phone [phone]   Call For: Dr Arlyce Dice Summary of Call: Her stomach is still hurting her. Initial call taken by: Leanor Kail [facility],  September 23, 2010 10:16 AM  Follow-up for Phone Call        Left message to call back Follow-up by: Selinda Michaels RN,  September 23, 2010 10:30 AM  Additional Follow-up for Phone Call Additional follow up Details #1::        Patient states that she had an egd Friday and that she is still having problems with burning especially in the morning and late at night. States that her Omeprazole is not really helping. Dr. Arlyce Dice please advise. Additional Follow-up by: Selinda Michaels RN,  September 24, 2010 8:31 AM    Additional Follow-up for Phone Call Additional follow up Details #2::    Try dexilant 60mg  qam.  She can try a second dose before dinner prn Follow-up by: Louis Meckel MD,  September 24, 2010 8:49 AM  New/Updated Medications: DEXILANT 60 MG CPDR (DEXLANSOPRAZOLE) Take 1 by mouth q am Prescriptions: DEXILANT 60 MG CPDR (DEXLANSOPRAZOLE) Take 1 by mouth q am  #15 x 0   Entered by:   Selinda Michaels RN   Authorized by:   Louis Meckel MD   Signed by:   Selinda Michaels RN on 09/24/2010   Method used:   Samples Given   RxID:   [id number]  Patient aware of recommendations and will pick-up medications.

## 2010-10-03 NOTE — Progress Notes (Signed)
Summary: Stomach Pain  Phone Note Call from Patient Call back at Home Phone [phone]   Summary of Call: Pt c/o stomach pain when taking metronidazole.  Initial call taken by: Lamar Sprinkles, CMA,  July 11, 2010 10:51 AM  Follow-up for Phone Call        ok to stop Metronidazole Follow-up by: Tresa Garter MD,  July 12, 2010 9:12 AM  Additional Follow-up for Phone Call Additional follow up Details #1::        Pt informed  Additional Follow-up by: Lamar Sprinkles, CMA,  July 12, 2010 11:07 AM

## 2010-10-03 NOTE — Progress Notes (Signed)
Summary: Call Report  Phone Note Other Incoming   Caller: [facility] Summary of Call: [facility] Triage Call Report Triage Record Num: [id number] Operator: Di Kindle Patient Name: [name] [name] Call Date & Time: 06/08/2010 8:14:08AM Patient Phone: [phone] PCP: Sonda Primes Patient Gender: Female PCP Fax : [phone] Patient DOB: July 22, 1933 Practice Name: [facility] Reason for Call: Pt calling: onset of stomach hurting all week, has tried OTC med, dark BM ?, afebrile. Guideline: abd pain, caller to office for appt now. Protocol(s) Used: Abdominal Pain Recommended Outcome per Protocol: See ED Immediately Reason for Outcome: Abdominal pain that has steadily worsened over hours OR has been continuous for 3 hours or more AND any of the following: loss of appetite, vomiting starting after pain, any fever, OR unable to carry out normal activities Care Advice:  ~ Another adult should drive.  ~ Pain medication or laxatives should not be taken until symptoms are evaluated. 10/ Initial call taken by: Margaret Pyle, CMA,  June 10, 2010 8:20 AM  Follow-up for Phone Call        agree w/ER Follow-up by: Tresa Garter MD,  June 10, 2010 12:51 PM

## 2010-10-03 NOTE — Assessment & Plan Note (Signed)
Summary: STOMACH PROBLEM--STC   Vital Signs:  Patient profile:   77 year old female Height:      62 inches Weight:      216 pounds BMI:     39.65 Temp:     98.8 degrees F oral Pulse rate:   76 / minute Pulse rhythm:   regular Resp:     16 per minute BP sitting:   102 / 80  (left arm) Cuff size:   large  Vitals Entered By: Lanier Prude, Beverly Gust) (June 10, 2010 4:19 PM) CC: abdominal pain X 3 days Is Patient Diabetic? No   Primary Care Doneta Bayman:  ALEX PLOTNIKOV  CC:  abdominal pain X 3 days.  History of Present Illness: C/o lower abd pain and rumbling x 2 wks - much worse since Fri. No vomiting. Has had chills.  Current Medications (verified): 1)  Klor-Con M20 20 Meq  Tbcr (Potassium Chloride Crys Cr) .Marland Kitchen.. 1 Two or 3  Times A Day 2)  Exforge 10-320 Mg  Tabs (Amlodipine Besylate-Valsartan) .Marland Kitchen.. 1 Once Daily 3)  Vitamin D3 1000 Unit  Tabs (Cholecalciferol) .Marland Kitchen.. 1 Qd 4)  Lotrisone 1-0.05 % Crea (Clotrimazole-Betamethasone) .... Use Two Times A Day 5)  Vitamin B-12 Cr 1000 Mcg  Tbcr (Cyanocobalamin) .Marland Kitchen.. 1 By Mouth Daily 6)  Fluoxetine Hcl 10 Mg  Caps (Fluoxetine Hcl) .... Take 1 Capsule By Mouth Daily 7)  Ibuprofen 600 Mg  Tabs (Ibuprofen) .Marland Kitchen.. 1 By Mouth Two Times A Day Pc X 1 Wk, Then As Needed Pain 8)  Hydrocodone-Acetaminophen 5-325 Mg Tabs (Hydrocodone-Acetaminophen) .Marland Kitchen.. 1-2 Every 6hrs 9)  Vitamin D 16109 Unit Caps (Ergocalciferol) .Marland Kitchen.. 1 Pill Q Week X6 Weeks 10)  Advil 200 Mg Tabs (Ibuprofen) .... As Needed 11)  Tramadol Hcl 50 Mg Tabs (Tramadol Hcl) .Marland Kitchen.. 1-2 Tabs By Mouth Two Times A Day As Needed Pain 12)  Triamcinolone Acetonide 0.5 % Crea (Triamcinolone Acetonide) .... Use Two Times A Day Prn 13)  Gabapentin 100 Mg Caps (Gabapentin) .Marland Kitchen.. 1-2 By Mouth At Bedtime As Needed Numbness 14)  Loratadine 10 Mg Tabs (Loratadine) .Marland Kitchen.. 1 By Mouth Once Daily As Needed Allergies  Allergies (verified): 1)  ! Cipro 2)  ! Asa 3)  ! Codeine 4)  ! * Hctz 5)  Femara  (Letrozole) 6)  Arimidex (Anastrozole)  Past History:  Past Medical History: Last updated: 01/31/2010 Anemia-iron deficiency Diverticulosis, colon Dr Arlyce Dice; Colon 2004 Hyperlipidemia Hypertension Vit D def Vit B12 def H/o hematuria Dr Vonita Moss GYN  Breast cancer, hx of  L Depression, grief 2009 Osteoarthritis, knees Breast CA R 2009 Skin cancer, hx of 2011  Past Surgical History: Last updated: 01/31/2010 TKR 06/04/98 Mastectomy L; R 09/13/2008 L groin skin ca 2011  Review of Systems       The patient complains of fever and abdominal pain.  The patient denies melena and hematochezia.    Physical Exam  Nose:  External nasal examination shows no deformity or inflammation. Nasal mucosa are pink and moist without lesions or exudates. Mouth:  pharynx pink and moist.   Lungs:  Normal respiratory effort, chest expands symmetrically. Lungs are clear to auscultation, no crackles or wheezes. Heart:  Normal rate and regular rhythm. S1 and S2 normal without gallop, murmur, click, rub or other extra sounds. Abdomen:  mild to mod  tenderness bilat lq without rebound or gaurding  a little worse on R normal bowel sounds, no distention, no masses, no hepatomegaly, and no splenomegaly.   Msk:  no CVA  tenderness  no lumbar spinous tenderness  Neurologic:  No cranial nerve deficits noted. Station and gait are normal. Plantar reflexes are down-going bilaterally. DTRs are symmetrical throughout. Sensory, motor and coordinative functions appear intact. Skin:  Intact without suspicious lesions or rashes nl color and turgor with nl capillary refil  Psych:  normal affect, talkative and pleasant    Impression & Recommendations:  Problem # 1:  ABDOMINAL PAIN, LOWER (ICD-789.09) Assessment Deteriorated Start antibiotic  She is not sure abou appendix Her updated medication list for this problem includes:    Ibuprofen 600 Mg Tabs (Ibuprofen) .Marland Kitchen... 1 by mouth two times a day pc x 1 wk, then as  needed pain    Hydrocodone-acetaminophen 5-325 Mg Tabs (Hydrocodone-acetaminophen) .Marland Kitchen... 1-2 every 6hrs    Advil 200 Mg Tabs (Ibuprofen) .Marland Kitchen... As needed    Tramadol Hcl 50 Mg Tabs (Tramadol hcl) .Marland Kitchen... 1-2 tabs by mouth two times a day as needed pain  Orders: TLB-Udip ONLY (81003-UDIP) TLB-BMP (Basic Metabolic Panel-BMET) (80048-METABOL) TLB-Sedimentation Rate (ESR) (85652-ESR) TLB-Hepatic/Liver Function Pnl (80076-HEPATIC) TLB-CBC Platelet - w/Differential (85025-CBCD) Radiology Referral (Radiology)  Problem # 2:  WEIGHT LOSS, ABNORMAL (ICD-783.21) Assessment: Comment Only  Problem # 3:  NAUSEA ALONE (ICD-787.02) Assessment: New  Problem # 4:  CHILLS WITHOUT FEVER (ICD-780.64) Assessment: New  Complete Medication List: 1)  Klor-con M20 20 Meq Tbcr (Potassium chloride crys cr) .Marland Kitchen.. 1 two or 3  times a day 2)  Exforge 10-320 Mg Tabs (Amlodipine besylate-valsartan) .Marland Kitchen.. 1 once daily 3)  Vitamin D3 1000 Unit Tabs (Cholecalciferol) .Marland Kitchen.. 1 qd 4)  Lotrisone 1-0.05 % Crea (Clotrimazole-betamethasone) .... Use two times a day 5)  Vitamin B-12 Cr 1000 Mcg Tbcr (Cyanocobalamin) .Marland Kitchen.. 1 by mouth daily 6)  Fluoxetine Hcl 10 Mg Caps (Fluoxetine hcl) .... Take 1 capsule by mouth daily 7)  Ibuprofen 600 Mg Tabs (Ibuprofen) .Marland Kitchen.. 1 by mouth two times a day pc x 1 wk, then as needed pain 8)  Hydrocodone-acetaminophen 5-325 Mg Tabs (Hydrocodone-acetaminophen) .Marland Kitchen.. 1-2 every 6hrs 9)  Vitamin D 04540 Unit Caps (Ergocalciferol) .Marland Kitchen.. 1 pill q week x6 weeks 10)  Advil 200 Mg Tabs (Ibuprofen) .... As needed 11)  Tramadol Hcl 50 Mg Tabs (Tramadol hcl) .Marland Kitchen.. 1-2 tabs by mouth two times a day as needed pain 12)  Triamcinolone Acetonide 0.5 % Crea (Triamcinolone acetonide) .... Use two times a day prn 13)  Gabapentin 100 Mg Caps (Gabapentin) .Marland Kitchen.. 1-2 by mouth at bedtime as needed numbness 14)  Loratadine 10 Mg Tabs (Loratadine) .Marland Kitchen.. 1 by mouth once daily as needed allergies 15)  Augmentin 875-125 Mg Tabs  (Amoxicillin-pot clavulanate) .Marland Kitchen.. 1 by mouth bid  Patient Instructions: 1)  Please schedule a follow-up appointment in 2 weeks 2)  Low residue diet 3)  Call if you are not better in a reasonable amount of time or if worse. Go to ER if feeling really bad!  Prescriptions: AUGMENTIN 875-125 MG TABS (AMOXICILLIN-POT CLAVULANATE) 1 by mouth bid  #20 x 0   Entered and Authorized by:   Tresa Garter MD   Signed by:   Tresa Garter MD on 06/10/2010   Method used:   [facility]        [facility] [address] [phone][facility] (retail)       [address]       [place], [place]  [id number]       Ph: [phone] or [phone]  Fax: [phone]   RxID:   [id number]

## 2010-10-03 NOTE — Assessment & Plan Note (Signed)
Summary: sore throat/#/cd   Vital Signs:  Patient profile:   77 year old female Weight:      218 pounds BMI:     40.02 Temp:     98.7 degrees F oral Pulse rate:   87 / minute BP sitting:   126 / 100  (left arm)  Vitals Entered By: Tora Perches (September 03, 2009 4:09 PM) CC: sore throat Is Patient Diabetic? No   Primary Care Provider:  ALEX PLOTNIKOV  CC:  sore throat.  History of Present Illness: The patient presents with complaints of sore throat, fever, cough, sinus congestion and drainge of several days duration. Not better with OTC meds.  Muscle aches are present.  The mucus is colored. C/o diarrhea x3 for  1 d. F/u on eczema   Preventive Screening-Counseling & Management  Alcohol-Tobacco     Smoking Status: never  Current Medications (verified): 1)  Klor-Con M20 20 Meq  Tbcr (Potassium Chloride Crys Cr) .Marland Kitchen.. 1 Two or 3  Times A Day 2)  Exforge 10-320 Mg  Tabs (Amlodipine Besylate-Valsartan) .Marland Kitchen.. 1 Once Daily 3)  Vitamin D3 1000 Unit  Tabs (Cholecalciferol) .Marland Kitchen.. 1 Qd 4)  Lotrisone 1-0.05 % Crea (Clotrimazole-Betamethasone) .... Use Two Times A Day 5)  Vitamin B-12 Cr 1000 Mcg  Tbcr (Cyanocobalamin) .Marland Kitchen.. 1 By Mouth Daily 6)  Fluoxetine Hcl 10 Mg  Caps (Fluoxetine Hcl) .... Take 1 Capsule By Mouth Daily 7)  Ibuprofen 600 Mg  Tabs (Ibuprofen) .Marland Kitchen.. 1 By Mouth Two Times A Day Pc X 1 Wk, Then As Needed Pain 8)  Hydrocodone-Acetaminophen 5-325 Mg Tabs (Hydrocodone-Acetaminophen) .Marland Kitchen.. 1-2 Every 6hrs 9)  Vitamin D 16109 Unit Caps (Ergocalciferol) .Marland Kitchen.. 1 Pill Q Week X6 Weeks 10)  Femara 2.5 Mg Tabs (Letrozole) .... Once Daily 11)  Sleeping Plills (Otc) 12)  Topicort 0.25 % Crea (Desoximetasone) .... Use Bid 13)  Arimidex 1 Mg Tabs (Anastrozole) .... Once Daily 14)  Advil 200 Mg Tabs (Ibuprofen) .... As Needed 15)  Prednisone 5 Mg Tabs (Prednisone) .... Once Daily 16)  Tessalon Perles 100 Mg Caps (Benzonatate) .Marland Kitchen.. 1-2 By Mouth Two Times A Day As Needed Cough 17)  Tramadol  Hcl 50 Mg Tabs (Tramadol Hcl) .Marland Kitchen.. 1-2 Tabs By Mouth Two Times A Day As Needed Pain 18)  Triamcinolone Acetonide 0.5 % Crea (Triamcinolone Acetonide) .... Use Two Times A Day Prn  Allergies: 1)  ! Cipro 2)  ! Asa 3)  ! Codeine 4)  ! * Hctz 5)  Femara (Letrozole)  Past History:  Past Medical History: Last updated: 10/04/2008 Anemia-iron deficiency Diverticulosis, colon Dr Arlyce Dice; Colon 2004 Hyperlipidemia Hypertension Vit D def Vit B12 def H/o hematuria Dr Vonita Moss GYN  Breast cancer, hx of  L Depression, grief 2009 Osteoarthritis, knees Breast CA R 2009  Social History: Last updated: 08/15/2008 Retired  Never Smoked Alcohol use-no Widow/Widower 2009  Physical Exam  General:  NAD overweight-appearing.   Ears:  WNL Mouth:  Erythematous throat mucosa and intranasal erythema.  Lungs:  Normal respiratory effort, chest expands symmetrically. Lungs are clear to auscultation, no crackles or wheezes. Heart:  Normal rate and regular rhythm. S1 and S2 normal without gallop, murmur, click, rub or other extra sounds. Abdomen:  Bowel sounds positive,abdomen soft and non-tender without masses, organomegaly or hernias noted. Msk:  B knees a little tender w/ROM - better Walks w/a cane Skin:  Dry skin patches Psych:  Oriented X3, memory intact for recent and remote, normally interactive, and good eye contact.  Impression & Recommendations:  Problem # 1:  SINUSITIS, ACUTE (ICD-461.9) Assessment New  The following medications were removed from the medication list:    Tessalon Perles 100 Mg Caps (Benzonatate) .Marland Kitchen... 1-2 by mouth two times a day as needed cough Her updated medication list for this problem includes:    Zithromax Z-pak 250 Mg Tabs (Azithromycin) .Marland Kitchen... As dirrected  Problem # 2:  DIARRHEA, ACUTE (ICD-787.91) Assessment: New Immodium prn  Problem # 3:  RASH AND OTHER NONSPECIFIC SKIN ERUPTION (ICD-782.1) Assessment: Improved  The following medications were  removed from the medication list:    Topicort 0.25 % Crea (Desoximetasone) ..... Use bid Her updated medication list for this problem includes:    Lotrisone 1-0.05 % Crea (Clotrimazole-betamethasone) ..... Use two times a day    Triamcinolone Acetonide 0.5 % Crea (Triamcinolone acetonide) ..... Use two times a day prn  Problem # 4:  BREAST CANCER, HX OF (ICD-V10.3) Assessment: Comment Only On prescription therapy   Complete Medication List: 1)  Klor-con M20 20 Meq Tbcr (Potassium chloride crys cr) .Marland Kitchen.. 1 two or 3  times a day 2)  Exforge 10-320 Mg Tabs (Amlodipine besylate-valsartan) .Marland Kitchen.. 1 once daily 3)  Vitamin D3 1000 Unit Tabs (Cholecalciferol) .Marland Kitchen.. 1 qd 4)  Lotrisone 1-0.05 % Crea (Clotrimazole-betamethasone) .... Use two times a day 5)  Vitamin B-12 Cr 1000 Mcg Tbcr (Cyanocobalamin) .Marland Kitchen.. 1 by mouth daily 6)  Fluoxetine Hcl 10 Mg Caps (Fluoxetine hcl) .... Take 1 capsule by mouth daily 7)  Ibuprofen 600 Mg Tabs (Ibuprofen) .Marland Kitchen.. 1 by mouth two times a day pc x 1 wk, then as needed pain 8)  Hydrocodone-acetaminophen 5-325 Mg Tabs (Hydrocodone-acetaminophen) .Marland Kitchen.. 1-2 every 6hrs 9)  Vitamin D 91478 Unit Caps (Ergocalciferol) .Marland Kitchen.. 1 pill q week x6 weeks 10)  Arimidex 1 Mg Tabs (Anastrozole) .... Once daily 11)  Advil 200 Mg Tabs (Ibuprofen) .... As needed 12)  Tramadol Hcl 50 Mg Tabs (Tramadol hcl) .Marland Kitchen.. 1-2 tabs by mouth two times a day as needed pain 13)  Triamcinolone Acetonide 0.5 % Crea (Triamcinolone acetonide) .... Use two times a day prn 14)  Zithromax Z-pak 250 Mg Tabs (Azithromycin) .... As dirrected  Patient Instructions: 1)  Take Immodium AD 1 by mouth two times a day as needed diarrhea 2)  Call if you are not better in a reasonable amount of time or if worse.  Prescriptions: TRIAMCINOLONE ACETONIDE 0.5 % CREA (TRIAMCINOLONE ACETONIDE) use two times a day prn  #120 g x 3   Entered and Authorized by:   Tresa Garter MD   Signed by:   Tresa Garter MD on  09/03/2009   Method used:   [facility]        [facility] [address] [phone][facility] (retail)       [address]       [place], [place]  [address]       Ph: [phone] or [phone]       Fax: [phone]   RxID:   [id number] LOTRISONE 1-0.05 % CREA (CLOTRIMAZOLE-BETAMETHASONE) use two times a day  #45 g x 1   Entered and Authorized by:   Tresa Garter MD   Signed by:   Tresa Garter MD on 09/03/2009   Method used:   [facility]        [facility]  [address] [phone]* (retail)       [address]  [place], [place]  [address]       Ph: [phone] or [phone]       Fax: [phone]   RxID:   [id number] Z-PAK 250 MG TABS (AZITHROMYCIN) as dirrected  #1 x 0   Entered and Authorized by:   Tresa Garter MD   Signed by:   Tresa Garter MD on 09/03/2009   Method used:   [facility]        [facility]  [address] [phone]* (retail)       [address]       [place], [place]  [address]       Ph: [phone] or [phone]       Fax: [phone]   RxID:   [id number]

## 2010-10-03 NOTE — Procedures (Signed)
Summary: colonoscopy   Colonoscopy  Procedure date:  08/07/2003  Findings:      Results: Normal. Location:  [facility].    Comments:      Repeat colonoscopy in 5 years.  Patient Name: [name] [name], [name] [name] MRN:  Procedure Procedures: Colonoscopy CPT: [id number].  Personnel: Endoscopist: Barbette Hair. Arlyce Dice, MD.  Referred By: Linda Hedges Plotnikov, MD.  Indications Symptoms: Hematochezia.  History  Current Medications: Patient is not currently taking Coumadin.  Pre-Exam Physical: Performed Aug 07, 2003. Entire physical exam was normal.  Exam Exam: Extent of exam reached: Cecum, extent intended: Cecum.  The cecum was identified by IC valve. Colon retroflexion performed. ASA Classification: I. Tolerance: good.  Monitoring: Pulse and BP monitoring, Oximetry used. Supplemental O2 given. at 2 Liters.  Colon Prep Used Golytely for colon prep. Prep results: good.  Sedation Meds: Fentanyl 50 mcg. given IV. Versed 6 mg. given IV.  Findings - NORMAL EXAM: Cecum to Rectum.  NORMAL EXAM: Cecum.  NORMAL EXAM: Rectum.   Assessment Normal examination.  Events  Unplanned Interventions: No intervention was required.  Unplanned Events: There were no complications. Plans Patient Education: Patient given standard instructions for: a normal exam.  Scheduling/Referral: Colonoscopy, to Molly Maduro D. Arlyce Dice, MD, around Aug 06, 2008.    This report was created from the original endoscopy report, which was reviewed and signed by the above listed endoscopist.

## 2010-10-03 NOTE — Progress Notes (Signed)
Summary: REQ RX  Phone Note Call from Patient   Summary of Call: Patient is requesting rx. Says she is not any better then when last seen in the office.  Initial call taken by: Lamar Sprinkles, CMA,  September 06, 2009 11:35 AM  Follow-up for Phone Call        Use over-the-counter medicines for "cold": Tylenol  650mg  or Advil 400mg  every 6 hours  for fever; Delsym or Robutussin for cough. Mucinex or Mucinex D for congestion. Ricola or Halls for sore throat. Office visit if not better or if worse. [employer] keeps working x 10 d Follow-up by: Tresa Garter MD,  September 06, 2009 1:20 PM  Additional Follow-up for Phone Call Additional follow up Details #1::        Pt informed  Additional Follow-up by: Lamar Sprinkles, CMA,  September 06, 2009 1:57 PM

## 2010-10-03 NOTE — Letter (Signed)
Summary: [facility]  [facility]   Imported By: Lennie Odor 06/20/2010 15:22:20  _____________________________________________________________________  External Attachment:    Type:   Image     Comment:   External Document

## 2010-10-03 NOTE — Letter (Signed)
Summary: Results Letter  [facility]  [address] [place], [place] [address]   Phone: [phone]  Fax: [phone]        September 26, 2010 MRN: [id number]    [facility] [address] [place], [place]  [address]    Dear Ms. 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  This is associated with recurrent inflammation of the stomach and duodenum, and recurrent ulcer disease.  My nurse will be calling in a prescription for treatment.            Sincerely,  Louis Meckel MD  This letter has been electronically signed by your physician.  Appended Document: Results Letter Letter is mailed to the patient's home address

## 2010-10-03 NOTE — Progress Notes (Signed)
Summary: med too expensive  Phone Note Call from Patient Call back at Home Phone [phone]   Caller: Patient Call For: Dr Arlyce Dice Reason for Call: Talk to Nurse Summary of Call: Patient wants to know if theres an alternative for Prevpac, states that it's too expensive and her insurance wont pay for it. Initial call taken by: Tawni Levy,  September 27, 2010 2:38 PM  Follow-up for Phone Call        Spoke with patient and per Mike Gip, PA she may take Flagyl and Biaxin along with her Dexilant. Prescriptions sent to her pharmacy. Follow-up by: Selinda Michaels RN,  September 27, 2010 4:42 PM    New/Updated Medications: BIAXIN 500 MG TABS (CLARITHROMYCIN) Take one by mouth two times a day. METRONIDAZOLE 250 MG TABS (METRONIDAZOLE) Take 1 by mouth three times a day Prescriptions: METRONIDAZOLE 250 MG TABS (METRONIDAZOLE) Take 1 by mouth three times a day  #42 x 0   Entered by:   Selinda Michaels RN   Authorized by:   Louis Meckel MD   Signed by:   Selinda Michaels RN on 09/27/2010   Method used:   [facility]        [facility]  [address] [phone][facility] (retail[facility]       [address]       [place], [place]  [address]       Ph: [phone] or [phone]       Fax: [phone]   RxID:   [id number] BIAXIN 500 MG TABS (CLARITHROMYCIN) Take one by mouth two times a day.  #28 x 0   Entered by:   Selinda Michaels RN   Authorized by:   Louis Meckel MD   Signed by:   Selinda Michaels RN on 09/27/2010   Method used:   [facility]        [facility]  [address] [phone]* (retail)       [address]       [place], [place]  [id number]       Ph: [phone] or [phone]       Fax: [phone]   RxID:   [id number]

## 2010-10-03 NOTE — Assessment & Plan Note (Signed)
Summary: 3 MO ROV /NWS   Vital Signs:  Patient profile:   77 year old female Height:      62 inches Weight:      217 pounds BMI:     39.83 O2 Sat:      96 % on Room air Temp:     98.8 degrees F oral Pulse rate:   65 / minute Pulse rhythm:   regular Resp:     16 per minute BP sitting:   130 / 70  (right arm) Cuff size:   large  Vitals Entered By: Lanier Prude, CMA(AAMA) (May 09, 2010 9:05 AM)  O2 Flow:  Room air CC: 3 mo f/u Is Patient Diabetic? No   Primary Care Demetria Lightsey:  ALEX PLOTNIKOV  CC:  3 mo f/u.  History of Present Illness: The patient presents for a follow up of hypertension, depression, hyperlipidemia, wt loss. Doing well now   Current Medications (verified): 1)  Klor-Con M20 20 Meq  Tbcr (Potassium Chloride Crys Cr) .Marland Kitchen.. 1 Two or 3  Times A Day 2)  Exforge 10-320 Mg  Tabs (Amlodipine Besylate-Valsartan) .Marland Kitchen.. 1 Once Daily 3)  Vitamin D3 1000 Unit  Tabs (Cholecalciferol) .Marland Kitchen.. 1 Qd 4)  Lotrisone 1-0.05 % Crea (Clotrimazole-Betamethasone) .... Use Two Times A Day 5)  Vitamin B-12 Cr 1000 Mcg  Tbcr (Cyanocobalamin) .Marland Kitchen.. 1 By Mouth Daily 6)  Fluoxetine Hcl 10 Mg  Caps (Fluoxetine Hcl) .... Take 1 Capsule By Mouth Daily 7)  Ibuprofen 600 Mg  Tabs (Ibuprofen) .Marland Kitchen.. 1 By Mouth Two Times A Day Pc X 1 Wk, Then As Needed Pain 8)  Hydrocodone-Acetaminophen 5-325 Mg Tabs (Hydrocodone-Acetaminophen) .Marland Kitchen.. 1-2 Every 6hrs 9)  Vitamin D 01027 Unit Caps (Ergocalciferol) .Marland Kitchen.. 1 Pill Q Week X6 Weeks 10)  Arimidex 1 Mg Tabs (Anastrozole) .... Once Daily 11)  Advil 200 Mg Tabs (Ibuprofen) .... As Needed 12)  Tramadol Hcl 50 Mg Tabs (Tramadol Hcl) .Marland Kitchen.. 1-2 Tabs By Mouth Two Times A Day As Needed Pain 13)  Triamcinolone Acetonide 0.5 % Crea (Triamcinolone Acetonide) .... Use Two Times A Day Prn 14)  Gabapentin 100 Mg Caps (Gabapentin) .Marland Kitchen.. 1-2 By Mouth At Bedtime As Needed Numbness 15)  Loratadine 10 Mg Tabs (Loratadine) .Marland Kitchen.. 1 By Mouth Once Daily As Needed  Allergies  Allergies (verified): 1)  ! Cipro 2)  ! Asa 3)  ! Codeine 4)  ! * Hctz 5)  Femara (Letrozole) 6)  Arimidex (Anastrozole)  Past History:  Past Medical History: Last updated: 01/31/2010 Anemia-iron deficiency Diverticulosis, colon Dr Arlyce Dice; Colon 2004 Hyperlipidemia Hypertension Vit D def Vit B12 def H/o hematuria Dr Vonita Moss GYN  Breast cancer, hx of  L Depression, grief 2009 Osteoarthritis, knees Breast CA R 2009 Skin cancer, hx of 2011  Social History: Last updated: 08/15/2008 Retired  Never Smoked Alcohol use-no Widow/Widower 2009  Review of Systems  The patient denies fever, dyspnea on exertion, and abdominal pain.    Physical Exam  General:  NAD overweight-appearing.   Nose:  External nasal examination shows no deformity or inflammation. Nasal mucosa are pink and moist without lesions or exudates. Mouth:  Erythematous throat mucosa and intranasal erythema.  Neck:  No deformities, masses, or tenderness noted. Lungs:  Normal respiratory effort, chest expands symmetrically. Lungs are clear to auscultation, no crackles or wheezes. Heart:  Normal rate and regular rhythm. S1 and S2 normal without gallop, murmur, click, rub or other extra sounds. Abdomen:  Bowel sounds positive,abdomen soft and non-tender without masses, organomegaly  or hernias noted. Msk:  B knees, B feet a little tender w/ROM - better Walks w/a cane Neurologic:  No cranial nerve deficits noted. Station and gait are normal. Plantar reflexes are down-going bilaterally. DTRs are symmetrical throughout. Sensory, motor and coordinative functions appear intact. Skin:  No rashes Psych:  Oriented X3, memory intact for recent and remote, normally interactive, and good eye contact.     Impression & Recommendations:  Problem # 1:  HYPERTENSION (ICD-401.9) Assessment Unchanged  Her updated medication list for this problem includes:    Exforge 10-320 Mg Tabs (Amlodipine  besylate-valsartan) .Marland Kitchen... 1 once daily  BP today: 130/70 Prior BP: 128/82 (01/31/2010)  Labs Reviewed: K+: 3.6 (01/25/2010) Creat: : 0.8 (01/25/2010)     Problem # 2:  WEIGHT LOSS, ABNORMAL (ICD-783.21) Assessment: Improved Resolved off med (arimidex)  Problem # 3:  DEPRESSION (ICD-311) Assessment: Improved  Her updated medication list for this problem includes:    Fluoxetine Hcl 10 Mg Caps (Fluoxetine hcl) .Marland Kitchen... Take 1 capsule by mouth daily  Problem # 4:  BREAST CANCER, HX OF (ICD-V10.3) Assessment: Unchanged  Problem # 5:  OSTEOARTHRITIS (ICD-715.90) Assessment: Unchanged See "Patient Instructions".  Her updated medication list for this problem includes:    Ibuprofen 600 Mg Tabs (Ibuprofen) .Marland Kitchen... 1 by mouth two times a day pc x 1 wk, then as needed pain    Hydrocodone-acetaminophen 5-325 Mg Tabs (Hydrocodone-acetaminophen) .Marland Kitchen... 1-2 every 6hrs    Advil 200 Mg Tabs (Ibuprofen) .Marland Kitchen... As needed    Tramadol Hcl 50 Mg Tabs (Tramadol hcl) .Marland Kitchen... 1-2 tabs by mouth two times a day as needed pain  Complete Medication List: 1)  Klor-con M20 20 Meq Tbcr (Potassium chloride crys cr) .Marland Kitchen.. 1 two or 3  times a day 2)  Exforge 10-320 Mg Tabs (Amlodipine besylate-valsartan) .Marland Kitchen.. 1 once daily 3)  Vitamin D3 1000 Unit Tabs (Cholecalciferol) .Marland Kitchen.. 1 qd 4)  Lotrisone 1-0.05 % Crea (Clotrimazole-betamethasone) .... Use two times a day 5)  Vitamin B-12 Cr 1000 Mcg Tbcr (Cyanocobalamin) .Marland Kitchen.. 1 by mouth daily 6)  Fluoxetine Hcl 10 Mg Caps (Fluoxetine hcl) .... Take 1 capsule by mouth daily 7)  Ibuprofen 600 Mg Tabs (Ibuprofen) .Marland Kitchen.. 1 by mouth two times a day pc x 1 wk, then as needed pain 8)  Hydrocodone-acetaminophen 5-325 Mg Tabs (Hydrocodone-acetaminophen) .Marland Kitchen.. 1-2 every 6hrs 9)  Vitamin D 16109 Unit Caps (Ergocalciferol) .Marland Kitchen.. 1 pill q week x6 weeks 10)  Advil 200 Mg Tabs (Ibuprofen) .... As needed 11)  Tramadol Hcl 50 Mg Tabs (Tramadol hcl) .Marland Kitchen.. 1-2 tabs by mouth two times a day as needed  pain 12)  Triamcinolone Acetonide 0.5 % Crea (Triamcinolone acetonide) .... Use two times a day prn 13)  Gabapentin 100 Mg Caps (Gabapentin) .Marland Kitchen.. 1-2 by mouth at bedtime as needed numbness 14)  Loratadine 10 Mg Tabs (Loratadine) .Marland Kitchen.. 1 by mouth once daily as needed allergies  Other Orders: Flu Vaccine 74yrs + MEDICARE PATIENTS (U0454) Administration Flu vaccine - MCR (U9811)  Patient Instructions: 1)  Please schedule a follow-up appointment in 3 months. 2)  BMP prior to visit, ICD-9: 3)  Hepatic Panel prior to visit, ICD-9: 4)  TSH prior to visit, ICD-9:401.1 5)  Use stretching and balance exercises that I have provided (15 min. or longer every day)   Flu Vaccine Consent Questions     Do you have a history of severe allergic reactions to this vaccine? no    Any prior history of allergic reactions to egg and/or  gelatin? no    Do you have a sensitivity to the preservative Thimersol? no    Do you have a past history of Guillan-Barre Syndrome? no    Do you currently have an acute febrile illness? no    Have you ever had a severe reaction to latex? no    Vaccine information given and explained to patient? yes    Are you currently pregnant? no    Lot Number:[id number]   Exp Date:03/01/2011   Site Given  Right Deltoid IM Lanier Prude, [facility])  May 09, 2010 9:50 AM

## 2010-10-03 NOTE — Assessment & Plan Note (Signed)
Summary: stomach problems/per phone note/cd   Vital Signs:  Patient profile:   77 year old female Height:      62 inches Weight:      209 pounds BMI:     38.36 Temp:     98.6 degrees F oral Pulse rate:   80 / minute Pulse rhythm:   regular Resp:     16 per minute BP sitting:   120 / 80  (left arm) Cuff size:   large  Vitals Entered By: Lanier Prude, CMA(AAMA) (June 27, 2010 11:03 AM) CC: abdominal pain Comments pt is not taking Loratadine   Primary Care Iracema Lanagan:  Sonda Primes  CC:  abdominal pain.  History of Present Illness: C/o abd pain, burping, sore mouth Not better after antibiotic  C/o gas and bloating all the time  Current Medications (verified): 1)  Klor-Con M20 20 Meq  Tbcr (Potassium Chloride Crys Cr) .Marland Kitchen.. 1 Two or 3  Times A Day 2)  Exforge 10-320 Mg  Tabs (Amlodipine Besylate-Valsartan) .Marland Kitchen.. 1 Once Daily 3)  Vitamin D3 1000 Unit  Tabs (Cholecalciferol) .Marland Kitchen.. 1 Qd 4)  Lotrisone 1-0.05 % Crea (Clotrimazole-Betamethasone) .... Use Two Times A Day 5)  Vitamin B-12 Cr 1000 Mcg  Tbcr (Cyanocobalamin) .Marland Kitchen.. 1 By Mouth Daily 6)  Fluoxetine Hcl 10 Mg  Caps (Fluoxetine Hcl) .... Take 1 Capsule By Mouth Daily 7)  Ibuprofen 600 Mg  Tabs (Ibuprofen) .Marland Kitchen.. 1 By Mouth Two Times A Day Pc X 1 Wk, Then As Needed Pain 8)  Hydrocodone-Acetaminophen 5-325 Mg Tabs (Hydrocodone-Acetaminophen) .Marland Kitchen.. 1-2 Every 6hrs 9)  Vitamin D 86578 Unit Caps (Ergocalciferol) .Marland Kitchen.. 1 Pill Q Week X6 Weeks 10)  Advil 200 Mg Tabs (Ibuprofen) .... As Needed 11)  Tramadol Hcl 50 Mg Tabs (Tramadol Hcl) .Marland Kitchen.. 1-2 Tabs By Mouth Two Times A Day As Needed Pain 12)  Triamcinolone Acetonide 0.5 % Crea (Triamcinolone Acetonide) .... Use Two Times A Day Prn 13)  Gabapentin 100 Mg Caps (Gabapentin) .Marland Kitchen.. 1-2 By Mouth At Bedtime As Needed Numbness 14)  Loratadine 10 Mg Tabs (Loratadine) .Marland Kitchen.. 1 By Mouth Once Daily As Needed Allergies  Allergies (verified): 1)  ! Cipro 2)  ! Asa 3)  ! Codeine 4)  ! * Hctz 5)   Femara (Letrozole) 6)  Arimidex (Anastrozole)  Past History:  Social History: Last updated: 08/15/2008 Retired  Never Smoked Alcohol use-no Widow/Widower 2009  Past Medical History: Anemia-iron deficiency Diverticulosis, colon Dr Arlyce Dice; Colon 2004 Hyperlipidemia Hypertension Vit D def Vit B12 def H/o hematuria Dr Vonita Moss GYN  Breast cancer, hx of  L Depression, grief 2009 Osteoarthritis, knees Breast CA R 2009 Skin cancer, hx of 2011 GERD  Review of Systems       The patient complains of abdominal pain.  The patient denies fever, weight loss, melena, hematochezia, and severe indigestion/heartburn.    Physical Exam  General:  overweight but generally well appearing  Nose:  External nasal examination shows no deformity or inflammation. Nasal mucosa are pink and moist without lesions or exudates. Mouth:  pharynx pink and moist.   Neck:  No deformities, masses, or tenderness noted. Lungs:  Normal respiratory effort, chest expands symmetrically. Lungs are clear to auscultation, no crackles or wheezes. Heart:  Normal rate and regular rhythm. S1 and S2 normal without gallop, murmur, click, rub or other extra sounds. Abdomen:  mild to mod  tenderness bilat lq without rebound or gaurding  a little worse on R normal bowel sounds, no distention, no masses, no  hepatomegaly, and no splenomegaly.   Msk:  no CVA tenderness  no lumbar spinous tenderness  Neurologic:  No cranial nerve deficits noted. Station and gait are normal. Plantar reflexes are down-going bilaterally. DTRs are symmetrical throughout. Sensory, motor and coordinative functions appear intact. Skin:  Intact without suspicious lesions or rashes nl color and turgor with nl capillary refil  Psych:  normal affect, talkative and pleasant    Impression & Recommendations:  Problem # 1:  ABDOMINAL PAIN, LOWER (ICD-789.09)/ bloating Assessment Unchanged  Flagyl and probiotic GI consult w/Dr Arlyce Dice if not  better  Problem # 2:  GERD (ICD-530.81) Assessment: New  Her updated medication list for this problem includes:    Omeprazole 40 Mg Cpdr (Omeprazole) .Marland Kitchen... 1 by mouth qam for indigestion  Complete Medication List: 1)  Klor-con M20 20 Meq Tbcr (Potassium chloride crys cr) .Marland Kitchen.. 1 two or 3  times a day 2)  Exforge 10-320 Mg Tabs (Amlodipine besylate-valsartan) .Marland Kitchen.. 1 once daily 3)  Vitamin D3 1000 Unit Tabs (Cholecalciferol) .Marland Kitchen.. 1 qd 4)  Lotrisone 1-0.05 % Crea (Clotrimazole-betamethasone) .... Use two times a day 5)  Vitamin B-12 Cr 1000 Mcg Tbcr (Cyanocobalamin) .Marland Kitchen.. 1 by mouth daily 6)  Fluoxetine Hcl 10 Mg Caps (Fluoxetine hcl) .... Take 1 capsule by mouth daily 7)  Ibuprofen 600 Mg Tabs (Ibuprofen) .Marland Kitchen.. 1 by mouth two times a day pc x 1 wk, then as needed pain 8)  Hydrocodone-acetaminophen 5-325 Mg Tabs (Hydrocodone-acetaminophen) .Marland Kitchen.. 1-2 every 6hrs 9)  Vitamin D 69629 Unit Caps (Ergocalciferol) .Marland Kitchen.. 1 pill q week x6 weeks 10)  Advil 200 Mg Tabs (Ibuprofen) .... As needed 11)  Tramadol Hcl 50 Mg Tabs (Tramadol hcl) .Marland Kitchen.. 1-2 tabs by mouth two times a day as needed pain 12)  Triamcinolone Acetonide 0.5 % Crea (Triamcinolone acetonide) .... Use two times a day prn 13)  Gabapentin 100 Mg Caps (Gabapentin) .Marland Kitchen.. 1-2 by mouth at bedtime as needed numbness 14)  Loratadine 10 Mg Tabs (Loratadine) .Marland Kitchen.. 1 by mouth once daily as needed allergies 15)  Metronidazole 250 Mg Tabs (Metronidazole) .Marland Kitchen.. 1 by mouth qid 16)  Omeprazole 40 Mg Cpdr (Omeprazole) .Marland Kitchen.. 1 by mouth qam for indigestion 17)  Align Caps (Probiotic product) .Marland Kitchen.. 1 by mouth once daily for your intesinal flora restoraion  Patient Instructions: 1)  Please schedule a follow-up appointment in 1 month. Prescriptions: ALIGN  CAPS (PROBIOTIC PRODUCT) 1 by mouth once daily for your intesinal flora restoraion  #30 x 1   Entered and Authorized by:   Tresa Garter MD   Signed by:   Tresa Garter MD on 06/27/2010   Method used:    Print then Give to Patient   RxID:   [id number] OMEPRAZOLE 40 MG CPDR (OMEPRAZOLE) 1 by mouth qam for indigestion  #30 x 6   Entered and Authorized by:   Tresa Garter MD   Signed by:   Tresa Garter MD on 06/27/2010   Method used:   Print then Give to Patient   RxID:   [id number] METRONIDAZOLE 250 MG TABS (METRONIDAZOLE) 1 by mouth qid  #40 x 1   Entered and Authorized by:   Tresa Garter MD   Signed by:   Tresa Garter MD on 06/27/2010   Method used:   Print then Give to Patient   RxID:   [id number]    Orders Added: 1)  Est. Patient Level III [60630]

## 2010-10-03 NOTE — Assessment & Plan Note (Signed)
Summary: 3 MTH FU--STC   Vital Signs:  Patient profile:   77 year old female Height:      62 inches (157.48 cm) Weight:      218.25 pounds (99.20 kg) O2 Sat:      97 % on Room air Temp:     97.1 degrees F (36.17 degrees C) oral Pulse rate:   79 / minute BP sitting:   128 / 82  (right arm) Cuff size:   large  Vitals Entered By: Josph Macho RMA (January 31, 2010 7:53 AM)  O2 Flow:  Room air CC: 3 month follow up/ pt states she is no longer taking Arimidex/ CF   Primary Care Provider:  ALEX Tanor Glaspy  CC:  3 month follow up/ pt states she is no longer taking Arimidex/ CF.  History of Present Illness: C/o leg numbness and pain The patient presents for a follow up of hypertension, neuropathy, hyperlipidemia She has been off chemo x 3 months (Arimidex)    Current Medications (verified): 1)  Klor-Con M20 20 Meq  Tbcr (Potassium Chloride Crys Cr) .Marland Kitchen.. 1 Two or 3  Times A Day 2)  Exforge 10-320 Mg  Tabs (Amlodipine Besylate-Valsartan) .Marland Kitchen.. 1 Once Daily 3)  Vitamin D3 1000 Unit  Tabs (Cholecalciferol) .Marland Kitchen.. 1 Qd 4)  Lotrisone 1-0.05 % Crea (Clotrimazole-Betamethasone) .... Use Two Times A Day 5)  Vitamin B-12 Cr 1000 Mcg  Tbcr (Cyanocobalamin) .Marland Kitchen.. 1 By Mouth Daily 6)  Fluoxetine Hcl 10 Mg  Caps (Fluoxetine Hcl) .... Take 1 Capsule By Mouth Daily 7)  Ibuprofen 600 Mg  Tabs (Ibuprofen) .Marland Kitchen.. 1 By Mouth Two Times A Day Pc X 1 Wk, Then As Needed Pain 8)  Hydrocodone-Acetaminophen 5-325 Mg Tabs (Hydrocodone-Acetaminophen) .Marland Kitchen.. 1-2 Every 6hrs 9)  Vitamin D [address] Unit Caps (Ergocalciferol) .Marland Kitchen.. 1 Pill Q Week X6 Weeks 10)  Arimidex 1 Mg Tabs (Anastrozole) .... Once Daily 11)  Advil 200 Mg Tabs (Ibuprofen) .... As Needed 12)  Tramadol Hcl 50 Mg Tabs (Tramadol Hcl) .Marland Kitchen.. 1-2 Tabs By Mouth Two Times A Day As Needed Pain 13)  Triamcinolone Acetonide 0.5 % Crea (Triamcinolone Acetonide) .... Use Two Times A Day Prn  Allergies (verified): 1)  ! Cipro 2)  ! Asa 3)  ! Codeine 4)  ! * Hctz 5)   Femara (Letrozole)  Past History:  Social History: Last updated: 08/15/2008 Retired  Never Smoked Alcohol use-no Widow/Widower 2009  Past Medical History: Anemia-iron deficiency Diverticulosis, colon Dr Arlyce Dice; Colon 2004 Hyperlipidemia Hypertension Vit D def Vit B12 def H/o hematuria Dr Vonita Moss GYN  Breast cancer, hx of  L Depression, grief 2009 Osteoarthritis, knees Breast CA R 2009 Skin cancer, hx of 2011  Past Surgical History: TKR 06/04/98 Mastectomy L; R 09/13/2008 L groin skin ca 2011  Review of Systems       The patient complains of weight loss and dyspnea on exertion.  The patient denies fever, syncope, abdominal pain, melena, and hematochezia.    Physical Exam  General:  NAD overweight-appearing.   Nose:  External nasal examination shows no deformity or inflammation. Nasal mucosa are pink and moist without lesions or exudates. Mouth:  Erythematous throat mucosa and intranasal erythema.  Neck:  No deformities, masses, or tenderness noted. Lungs:  Normal respiratory effort, chest expands symmetrically. Lungs are clear to auscultation, no crackles or wheezes. Heart:  Normal rate and regular rhythm. S1 and S2 normal without gallop, murmur, click, rub or other extra sounds. Abdomen:  Bowel sounds positive,abdomen soft  and non-tender without masses, organomegaly or hernias noted. Msk:  B knees, B feet a little tender w/ROM - better Walks w/a cane Neurologic:  No cranial nerve deficits noted. Station and gait are normal. Plantar reflexes are down-going bilaterally. DTRs are symmetrical throughout. Sensory, motor and coordinative functions appear intact. Skin:  No rashes Psych:  Oriented X3, memory intact for recent and remote, normally interactive, and good eye contact.     Impression & Recommendations:  Problem # 1:  LEG PAIN, BILATERAL (ICD-729.5) - poss neuropathy of unclear etiology Assessment Unchanged Try Gabapentin  Problem # 2:  FATIGUE  (ICD-780.79) multifactorial Assessment: Improved  Problem # 3:  DEPRESSION (ICD-311) Assessment: Improved  Her updated medication list for this problem includes:    Fluoxetine Hcl 10 Mg Caps (Fluoxetine hcl) .Marland Kitchen... Take 1 capsule by mouth daily  Problem # 4:  B12 DEFICIENCY (ICD-266.2) Assessment: Improved On prescription drug  therapy   Problem # 5:  SKIN CANCER, HX OF (ICD-V10.83) Assessment: New It was removed  Problem # 6:  BREAST CANCER, HX OF (ICD-V10.3) Assessment: Comment Only F/u w/Dr Judie Petit. is pending   Problem # 7:  HYPERTENSION (ICD-401.9) Assessment: Unchanged  Her updated medication list for this problem includes:    Exforge 10-320 Mg Tabs (Amlodipine besylate-valsartan) .Marland Kitchen... 1 once daily  Complete Medication List: 1)  Klor-con M20 20 Meq Tbcr (Potassium chloride crys cr) .Marland Kitchen.. 1 two or 3  times a day 2)  Exforge 10-320 Mg Tabs (Amlodipine besylate-valsartan) .Marland Kitchen.. 1 once daily 3)  Vitamin D3 1000 Unit Tabs (Cholecalciferol) .Marland Kitchen.. 1 qd 4)  Lotrisone 1-0.05 % Crea (Clotrimazole-betamethasone) .... Use two times a day 5)  Vitamin B-12 Cr 1000 Mcg Tbcr (Cyanocobalamin) .Marland Kitchen.. 1 by mouth daily 6)  Fluoxetine Hcl 10 Mg Caps (Fluoxetine hcl) .... Take 1 capsule by mouth daily 7)  Ibuprofen 600 Mg Tabs (Ibuprofen) .Marland Kitchen.. 1 by mouth two times a day pc x 1 wk, then as needed pain 8)  Hydrocodone-acetaminophen 5-325 Mg Tabs (Hydrocodone-acetaminophen) .Marland Kitchen.. 1-2 every 6hrs 9)  Vitamin D [address] Unit Caps (Ergocalciferol) .Marland Kitchen.. 1 pill q week x6 weeks 10)  Arimidex 1 Mg Tabs (Anastrozole) .... Once daily 11)  Advil 200 Mg Tabs (Ibuprofen) .... As needed 12)  Tramadol Hcl 50 Mg Tabs (Tramadol hcl) .Marland Kitchen.. 1-2 tabs by mouth two times a day as needed pain 13)  Triamcinolone Acetonide 0.5 % Crea (Triamcinolone acetonide) .... Use two times a day prn 14)  Gabapentin 100 Mg Caps (Gabapentin) .Marland Kitchen.. 1-2 by mouth at bedtime as needed numbness 15)  Loratadine 10 Mg Tabs (Loratadine) .Marland Kitchen.. 1 by mouth  once daily as needed allergies  Patient Instructions: 1)  Please schedule a follow-up appointment in 3 months. Prescriptions: LORATADINE 10 MG TABS (LORATADINE) 1 by mouth once daily as needed allergies  #30 x 6   Entered and Authorized by:   Tresa Garter MD   Signed by:   Tresa Garter MD on 01/31/2010   Method used:   Print then Give to Patient   RxID:   [id number] GABAPENTIN 100 MG CAPS (GABAPENTIN) 1-2 by mouth at bedtime as needed numbness  #60 x 6   Entered and Authorized by:   Tresa Garter MD   Signed by:   Tresa Garter MD on 01/31/2010   Method used:   Print then Give to Patient   RxID:   [id number]

## 2010-10-03 NOTE — Letter (Signed)
Summary: EGD Instructions  [facility]  [address] [place], [place] [address]   Phone: [phone]  Fax: [phone]       [name] [name]    19-Nov-1932    MRN: [id number]       Procedure Day /Date:Friday January 20th, 2012     Arrival Time: 1:00pm     Procedure Time: 2:00pm     Location of Procedure:                    _ x _ [facility] (4th Floor)    PREPARATION FOR ENDOSCOPY   On 09/20/10 THE DAY OF THE PROCEDURE:  1.   No solid foods, milk or milk products are allowed after midnight the night before your procedure.  2.   Do not drink anything colored red or purple.  Avoid juices with pulp.  No orange juice.  3.  You may drink clear liquids until 12:00pm, which is 2 hours before your procedure.                                                                                                CLEAR LIQUIDS INCLUDE: Water Jello Ice Popsicles Tea (sugar ok, no milk/cream) Powdered fruit flavored drinks Coffee (sugar ok, no milk/cream) Gatorade Juice: apple, white grape, white cranberry  Lemonade Clear bullion, consomm, broth Carbonated beverages (any kind) Strained chicken noodle soup Hard Candy   MEDICATION INSTRUCTIONS  Unless otherwise instructed, you should take regular prescription medications with a small sip of water as early as possible the morning of your procedure.        OTHER INSTRUCTIONS  You will need a responsible adult at least 75 years of age to accompany you and drive you home.   This person must remain in the waiting room during your procedure.  Wear loose fitting clothing that is easily removed.  Leave jewelry and other valuables at home.  However, you may wish to bring a book to read or an iPod/MP3 player to listen to music as you wait for your procedure to start.  Remove all body piercing jewelry and leave at home.  Total time from sign-in until discharge is approximately 2-3 hours.  You should go home  directly after your procedure and rest.  You can resume normal activities the day after your procedure.  The day of your procedure you should not:   Drive   Make legal decisions   Operate machinery   Drink alcohol   Return to work  You will receive specific instructions about eating, activities and medications before you leave.    The above instructions have been reviewed and explained to me by   Marchelle Folks.    I fully understand and can verbalize these instructions _____________________________ Date _________

## 2010-10-03 NOTE — Progress Notes (Signed)
Summary: ABD PAIN - GI APT TOMORROW  Phone Note Call from Patient Call back at Home Phone [phone]   Summary of Call: Pt c/o stomach pain and can not wait until GI apt in January. She wants to know what to do now. Initial call taken by: Lamar Sprinkles, CMA,  August 13, 2010 12:36 PM  Follow-up for Phone Call        PLS MOVE UP APPT - CAN SHE SEE A PA? Thank you!  Follow-up by: Tresa Garter MD,  August 13, 2010 4:16 PM  Additional Follow-up for Phone Call Additional follow up Details #1::        Spoke w/GI, They will call back, Left vm for pt to call office back...............Marland KitchenLamar Sprinkles, CMA  August 13, 2010 4:26 PM   9:45 apt tomorrow if pt is able. Spoke w/pt's daughter, she will contact pt and call back..........Marland KitchenLamar Sprinkles, CMA  August 13, 2010 4:57 PM     Additional Follow-up for Phone Call Additional follow up Details #2::    Pt informed, she will keep apt Thank you!  Georgina Quint Plotnikov MD  August 13, 2010 10:44 PM  Follow-up by: Lamar Sprinkles, CMA,  August 13, 2010 5:53 PM

## 2010-10-03 NOTE — Procedures (Addendum)
Summary: Upper Endoscopy  Patient: [name] Note: All result statuses are Final unless otherwise noted.  Tests: (1) Upper Endoscopy (EGD)   EGD Upper Endoscopy       DONE     [facility]     [address]     [place], [place]  [address]           ENDOSCOPY PROCEDURE REPORT           PATIENT:  [name] [name], [name] [name]  MR#:  [id number]     BIRTHDATE:  08-10-1933, 77 yrs. old  GENDER:  female           ENDOSCOPIST:  Barbette Hair. Arlyce Dice, MD     Referred by:           PROCEDURE DATE:  09/20/2010     PROCEDURE:  EGD with biopsy, 19[address]     ASA CLASS:  Class II     INDICATIONS:  abdominal pain           MEDICATIONS:   Fentanyl 50 mcg IV, Versed 7 mg IV, glycopyrrolate     (Robinal) 0.2 mg IV, 0.6cc simethancone 0.6 cc PO     TOPICAL ANESTHETIC:  Exactacain Spray           DESCRIPTION OF PROCEDURE:   After the risks benefits and     alternatives of the procedure were thoroughly explained, informed     consent was obtained.  [facility] [id number] endoscope was     introduced through the mouth and advanced to the third portion of     the duodenum, without limitations.  The instrument was slowly     withdrawn as the mucosa was fully examined.     <<PROCEDUREIMAGES>>           Mild gastritis was found. Mild erythema in fundus and body and     antrum. Bxs taken (see image5).  A sessile polyp was found in the     body of the stomach. 3mm sessile polyp. Bxs taken (see image4).     Otherwise the examination was normal (see image1, image2, image3,     image6, and image7).    Retroflexed views revealed no     abnormalities.    The scope was then withdrawn from the patient     and the procedure completed.           COMPLICATIONS:  None           ENDOSCOPIC IMPRESSION:     1) Mild gastritis     2) Sessile polyp in the body of the stomach     3) Otherwise normal examination           Abdominal pain probably NSAID-related.     RECOMMENDATIONS:     1) Avoid NSAIDS     2) OP  follow-up is advised on a PRN basis.     3) await biopsy results           REPEAT EXAM:  No           ______________________________     Barbette Hair. Arlyce Dice, MD           CC:  Linda Hedges. Plotnikov, MD           n.     Rosalie DoctorBarbette Hair. Forrestine Lecrone at 09/20/2010 02:21 PM           Renaye Rakers, [phone]  Note: An exclamation mark (!) indicates a result  that was not dispersed into the flowsheet. Document Creation Date: 09/20/2010 2:22 PM _______________________________________________________________________  (1) Order result status: Final Collection or observation date-time: 09/20/2010 14:15 Requested date-time:  Receipt date-time:  Reported date-time:  Referring Physician:   Ordering Physician: Melvia Heaps [phone]) Specimen Source:  Source: Launa Grill Order Number: [id number] Lab site:

## 2010-10-21 ENCOUNTER — Telehealth: Payer: Self-pay | Admitting: Gastroenterology

## 2010-10-22 ENCOUNTER — Telehealth: Payer: Self-pay | Admitting: Gastroenterology

## 2010-10-29 NOTE — Progress Notes (Signed)
Summary: Change rx   Phone Note Call from Patient Call back at Home Phone [phone]   Caller: Patient Call For: Dr Arlyce Dice Reason for Call: Talk to Nurse Summary of Call: Meds called in yesterday is too expensive her insurance won't cover it, wants something similar but cheaper. Initial call taken by: Tawni Levy,  October 22, 2010 8:40 AM  Follow-up for Phone Call        Dr Arlyce Dice, Please advise Follow-up by: Merri Ray CMA Duncan Dull),  October 22, 2010 8:46 AM  Additional Follow-up for Phone Call Additional follow up Details #1::        omeprazole 20mg  1-2x/day Additional Follow-up by: Louis Meckel MD,  October 22, 2010 10:24 AM    New/Updated Medications: OMEPRAZOLE 40 MG CPDR (OMEPRAZOLE) 1 by mouth 1-2 times a day as needed Prescriptions: OMEPRAZOLE 40 MG CPDR (OMEPRAZOLE) 1 by mouth 1-2 times a day as needed  #60 x 3   Entered by:   Merri Ray CMA (AAMA)   Authorized by:   Louis Meckel MD   Signed by:   Merri Ray CMA (AAMA) on 10/22/2010   Method used:   Electronically to        [facility]  [address] [phone]* (retail)       [address]       [place], [place]  [id number]       Ph: [phone] or [phone]       Fax: [phone]   RxID:   [id number]  Called [facility] and changed rx to Omeprazole 20 mg instead of 40. 40 done in error  Appended Document: change rx    Clinical Lists Changes  Medications: Changed medication from OMEPRAZOLE 40 MG CPDR (OMEPRAZOLE) 1 by mouth 1-2 times a day as needed to OMEPRAZOLE 20 MG CPDR (OMEPRAZOLE) 1-2 by mouth as needed two times a day

## 2010-10-29 NOTE — Progress Notes (Signed)
Summary: ? re meds  Phone Note Call from Patient Call back at Home Phone [phone]   Caller: Patient Call For: Dr Arlyce Dice Reason for Call: Talk to Nurse Summary of Call: Patient wants to speak to nurse regarding her meds, wants to know if she needs to continue taking it or does she needs to make an appt since she still having probs. Initial call taken by: Tawni Levy,  October 21, 2010 12:18 PM  Follow-up for Phone Call        Patient states she has finished taking her Biaxin and her Metroniadazole. Wonders if she needs to refill the meds because her stomach is still hurting in the mornings when she gets up. States that when she eats something or drinks some milk it stops hurting. Dr. Arlyce Dice please advise. Follow-up by: Selinda Michaels RN,  October 21, 2010 1:37 PM  Additional Follow-up for Phone Call Additional follow up Details #1::         she should have completed a 14 day course on Flagyl and Biaxin. She should continue Dexon. if pain continues, I like to see her back in the office Additional Follow-up by: Louis Meckel MD,  October 21, 2010 4:03 PM    Additional Follow-up for Phone Call Additional follow up Details #2::    Patient aware of Dr. Marzetta Board recommendations. She will call us back if no improvement for an ov. Follow-up by: Selinda Michaels RN,  October 21, 2010 4:23 PM  New/Updated Medications: DEXILANT 60 MG CPDR (DEXLANSOPRAZOLE) Take one by mouth daily 30 minutes before breakfast. Prescriptions: DEXILANT 60 MG CPDR (DEXLANSOPRAZOLE) Take one by mouth daily 30 minutes before breakfast.  #30 x 3   Entered by:   Selinda Michaels RN   Authorized by:   Louis Meckel MD   Signed by:   Selinda Michaels RN on 10/21/2010   Method used:   [facility]  [address] [phone]* (retail)       [address]       [place], [place]  [id number]       Ph: [phone] or [phone]       Fax: [phone]   RxID:   [id number]

## 2010-11-14 ENCOUNTER — Other Ambulatory Visit: Payer: Self-pay | Admitting: Oncology

## 2010-11-14 ENCOUNTER — Encounter (HOSPITAL_BASED_OUTPATIENT_CLINIC_OR_DEPARTMENT_OTHER): Payer: Medicare Other | Admitting: Oncology

## 2010-11-14 DIAGNOSIS — Z171 Estrogen receptor negative status [ER-]: Secondary | ICD-10-CM

## 2010-11-14 DIAGNOSIS — C50319 Malignant neoplasm of lower-inner quadrant of unspecified female breast: Secondary | ICD-10-CM

## 2010-11-14 DIAGNOSIS — Z170 Estrogen receptor positive status [ER+]: Secondary | ICD-10-CM

## 2010-11-14 DIAGNOSIS — Z86718 Personal history of other venous thrombosis and embolism: Secondary | ICD-10-CM

## 2010-11-14 LAB — CBC WITH DIFFERENTIAL/PLATELET
Basophils Absolute: 0 10*3/uL (ref 0.0–0.1)
Eosinophils Absolute: 0.2 10*3/uL (ref 0.0–0.5)
HGB: 12.1 g/dL (ref 11.6–15.9)
LYMPH%: 59.4 % — ABNORMAL HIGH (ref 14.0–49.7)
MCV: 82.7 fL (ref 79.5–101.0)
MONO#: 0.5 10*3/uL (ref 0.1–0.9)
MONO%: 9.5 % (ref 0.0–14.0)
NEUT#: 1.5 10*3/uL (ref 1.5–6.5)
Platelets: 264 10*3/uL (ref 145–400)
RBC: 4.39 10*6/uL (ref 3.70–5.45)
WBC: 5.5 10*3/uL (ref 3.9–10.3)

## 2010-11-14 LAB — COMPREHENSIVE METABOLIC PANEL
ALT: 12 U/L (ref 0–35)
AST: 15 U/L (ref 0–37)
Albumin: 4.1 g/dL (ref 3.5–5.2)
Alkaline Phosphatase: 60 U/L (ref 39–117)
BUN: 13 mg/dL (ref 6–23)
Calcium: 9.5 mg/dL (ref 8.4–10.5)
Chloride: 104 mEq/L (ref 96–112)
Potassium: 3.5 mEq/L (ref 3.5–5.3)

## 2010-11-21 ENCOUNTER — Other Ambulatory Visit: Payer: Self-pay | Admitting: Oncology

## 2010-11-21 ENCOUNTER — Encounter (HOSPITAL_BASED_OUTPATIENT_CLINIC_OR_DEPARTMENT_OTHER): Payer: Medicare Other | Admitting: Oncology

## 2010-11-21 DIAGNOSIS — C50319 Malignant neoplasm of lower-inner quadrant of unspecified female breast: Secondary | ICD-10-CM

## 2010-11-21 DIAGNOSIS — Z853 Personal history of malignant neoplasm of breast: Secondary | ICD-10-CM

## 2010-11-21 DIAGNOSIS — Z86718 Personal history of other venous thrombosis and embolism: Secondary | ICD-10-CM

## 2010-11-25 ENCOUNTER — Other Ambulatory Visit: Payer: Self-pay | Admitting: Internal Medicine

## 2010-12-16 LAB — BASIC METABOLIC PANEL
BUN: 8 mg/dL (ref 6–23)
CO2: 34 mEq/L — ABNORMAL HIGH (ref 19–32)
Calcium: 9.2 mg/dL (ref 8.4–10.5)
Glucose, Bld: 98 mg/dL (ref 70–99)
Potassium: 3.4 mEq/L — ABNORMAL LOW (ref 3.5–5.1)
Sodium: 141 mEq/L (ref 135–145)

## 2010-12-16 LAB — CBC
Hemoglobin: 12.5 g/dL (ref 12.0–15.0)
MCHC: 33.9 g/dL (ref 30.0–36.0)
MCV: 84 fL (ref 78.0–100.0)
RBC: 4.38 MIL/uL (ref 3.87–5.11)
RDW: 14.1 % (ref 11.5–15.5)

## 2010-12-16 LAB — LACTATE DEHYDROGENASE: LDH: 148 U/L (ref 94–250)

## 2010-12-16 LAB — DIFFERENTIAL
Basophils Absolute: 0.1 10*3/uL (ref 0.0–0.1)
Basophils Relative: 1 % (ref 0–1)
Eosinophils Absolute: 0.2 10*3/uL (ref 0.0–0.7)
Eosinophils Relative: 3 % (ref 0–5)
Monocytes Absolute: 0.4 10*3/uL (ref 0.1–1.0)

## 2011-01-06 ENCOUNTER — Ambulatory Visit
Admission: RE | Admit: 2011-01-06 | Discharge: 2011-01-06 | Disposition: A | Discharge status: Home / Self Care | Payer: Medicare Other | Source: Ambulatory Visit | Attending: Oncology | Admitting: Oncology

## 2011-01-06 DIAGNOSIS — Z853 Personal history of malignant neoplasm of breast: Secondary | ICD-10-CM

## 2011-01-07 ENCOUNTER — Encounter: Payer: Self-pay | Admitting: Internal Medicine

## 2011-01-08 ENCOUNTER — Encounter: Payer: Self-pay | Admitting: Internal Medicine

## 2011-01-08 ENCOUNTER — Ambulatory Visit (INDEPENDENT_AMBULATORY_CARE_PROVIDER_SITE_OTHER): Payer: Medicare Other | Admitting: Internal Medicine

## 2011-01-08 DIAGNOSIS — D0590 Unspecified type of carcinoma in situ of unspecified breast: Secondary | ICD-10-CM

## 2011-01-08 DIAGNOSIS — I10 Essential (primary) hypertension: Secondary | ICD-10-CM

## 2011-01-08 DIAGNOSIS — R634 Abnormal weight loss: Secondary | ICD-10-CM

## 2011-01-08 DIAGNOSIS — E538 Deficiency of other specified B group vitamins: Secondary | ICD-10-CM

## 2011-01-08 DIAGNOSIS — K219 Gastro-esophageal reflux disease without esophagitis: Secondary | ICD-10-CM

## 2011-01-08 MED ORDER — AMLODIPINE BESYLATE 10 MG PO TABS: 10.0000 mg | ORAL_TABLET | Freq: Every day | ORAL | Status: DC

## 2011-01-08 MED ORDER — LOSARTAN POTASSIUM 100 MG PO TABS: 100.0000 mg | ORAL_TABLET | Freq: Every day | ORAL | Status: DC

## 2011-01-08 NOTE — Progress Notes (Signed)
  Subjective:    Patient ID: [name] [name], female    DOB: September 14, 1932, 77 y.o.   MRN: [id number]  HPI  The patient presents for a follow-up of  Wt loss, chronic hypertension, chronic dyslipidemia, type 2 diabetes controlled with medicines  Wt Readings from Last 3 Encounters:  01/08/11 215 lb (97.523 kg)  08/09/10 212 lb (96.163 kg)  06/27/10 209 lb (94.802 kg)      Review of Systems  Constitutional: Positive for fatigue. Negative for chills and unexpected weight change.  HENT: Positive for postnasal drip.   Eyes: Negative for pain.  Respiratory: Negative for wheezing.   Genitourinary: Negative for enuresis.  Musculoskeletal: Negative for back pain and joint swelling.  Neurological: Negative for dizziness.  Psychiatric/Behavioral: Negative for suicidal ideas. The patient is nervous/anxious.    BP Readings from Last 3 Encounters:  01/08/11 140/90  08/14/10 114/68  08/09/10 140/90       Objective:   Physical Exam  Constitutional: She appears well-developed. No distress.       Obese  HENT:  Head: Normocephalic.  Right Ear: External ear normal.  Left Ear: External ear normal.  Nose: Nose normal.  Mouth/Throat: Oropharynx is clear and moist.  Eyes: Conjunctivae are normal. Pupils are equal, round, and reactive to light. Right eye exhibits no discharge. Left eye exhibits no discharge.  Neck: Normal range of motion. Neck supple. No JVD present. No tracheal deviation present. No thyromegaly present.  Cardiovascular: Normal rate, regular rhythm and normal heart sounds.   Pulmonary/Chest: No stridor. No respiratory distress. She has no wheezes.  Abdominal: Soft. Bowel sounds are normal. She exhibits no distension and no mass. There is no tenderness. There is no rebound and no guarding.  Musculoskeletal: She exhibits no edema and no tenderness.  Lymphadenopathy:    She has no cervical adenopathy.  Neurological: She displays normal reflexes. No cranial nerve deficit. She  exhibits normal muscle tone. Coordination normal.  Skin: No rash noted. No erythema.  Psychiatric: She has a normal mood and affect. Her behavior is normal. Judgment and thought content normal.          Assessment & Plan:  WEIGHT LOSS, ABNORMAL She gained 3 lbs  HYPERTENSION Exforge is too $$$ Change to Amlo and ARB  GERD Controlled  CA IN SITU, BREAST On Rx  B12 DEFICIENCY On Rx

## 2011-01-08 NOTE — Assessment & Plan Note (Signed)
Exforge is too $$$ Change to Amlo and ARB

## 2011-01-08 NOTE — Assessment & Plan Note (Signed)
Controlled.  

## 2011-01-08 NOTE — Assessment & Plan Note (Signed)
On Rx 

## 2011-01-08 NOTE — Assessment & Plan Note (Signed)
She gained 3 lbs

## 2011-01-14 NOTE — Op Note (Signed)
NAMECATHERINA, [name]               ACCOUNT NO.:  [id number]   MEDICAL RECORD NO.:  [id number]          PATIENT TYPE:  AMB   LOCATION:  DSC                          FACILITY:  [facility]   PHYSICIAN:  Lennie Muckle, MD      DATE OF BIRTH:  09-Jan-1933   DATE OF PROCEDURE:  09/13/2008  DATE OF DISCHARGE:                               OPERATIVE REPORT   PREOPERATIVE DIAGNOSIS:  Right breast cancer.   POSTOPERATIVE DIAGNOSIS:  Right breast cancer.   PROCEDURE:  Right mastectomy with sentinel node biopsy on the right.   SURGEON:  Lennie Muckle, MD   ASSISTANT:  Anselm Pancoast. Zachery Dakins, MD   FINDINGS:  Sentinel node was negative on blot for metastases.  One lymph  node was found which was hot and blue, count 1250.   Drains: A 19-French Blake drain was placed in the right chest wall.   ESTIMATED BLOOD LOSS:  Approximately 50 mL.   No immediate complications.   INDICATIONS FOR PROCEDURE:  [name] [name] is a 77 year old female whom I had  seen for calcifications on a mammogram.  She had a biopsy which revealed  a invasive breast cancer, also component of DCIS.  She had had a  previous right modified mastectomy in the past.  She did desire to have  a mastectomy on the right.  Informed consent was obtained prior to  procedure.   DETAILS OF PROCEDURE:  [name] was identified in the preoperative  holding suite.  She received an injection of the radioactive tracer for  the axillary dissection.  She had a right anterior chest marked by me  and nursing.  She received 2 g of cefazolin and was taken to the  operating room.  Her right anterior chest wall and breast were prepped  and draped in the usual sterile fashion.  I injected 5 mL of methylene  blue just underneath the area on the right.  This was massaged for  approximately 5 minutes.  Skin markings were placed using a 2-0 silk  tie.  Using Neoprobe as a guide, I placed an incision on the axilla.  Subcutaneous tissues were divided.  I found the  lymph node which was hot  and blue.  It measured 1250 on count.  The base count was less than 100.  We then proceeded with the mastectomy.  The incisions were placed after  marking the skin with a 2-0 silk suture.  Skin flaps were created with  knife as well as electrocautery.  The breast tissue was removed using  electrocautery and the margins of the clavicle to sternum.  The mammary  fold at the anterior serratus muscles and laterally at the pectoralis.  Breast tissue was removed with electrocautery.  Small vessels were  electrocoagulated for hemostasis.  The specimen was marked with a suture  marking the tail.  This was then handed off the field.  I irrigated  chest wall and there was no bleeding.  I placed a 19-French Blake drain  into the wound bed.  This drain was secured with a 2-0 nylon.  We closed  the defect on the chest wall using 3-0 Vicryl interrupted on dermal  sutures and then I closed the skin with a 4-0 running  Monocryl.  We also closed the axilla in a layered fashion.  Steri-Strips  were placed for dressing.  She received fluff dressing and an Ace wrap.  She will be monitored overnight and discharged home tomorrow with JP.  She will be asked to come back to see me next week, and we will keep  monitor of her JP.      Lennie Muckle, MD  Electronically Signed     ALA/MEDQ  D:  09/13/2008  T:  09/14/2008  Job:  [id number]   cc:   Valentino Hue. Magrinat, M.D.  Georgina Quint. Plotnikov, MD

## 2011-01-17 NOTE — Discharge Summary (Signed)
NAMEJARITZA, DUIGNAN               ACCOUNT NO.:  [id number]   MEDICAL RECORD NO.:  [id number]          PATIENT TYPE:  INP   LOCATION:  0376                         FACILITY:  [facility]   PHYSICIAN:  Gordy Savers, M.D. LHCDATE OF BIRTH:  October 31, 1932   DATE OF ADMISSION:  12/10/2004  DATE OF DISCHARGE:                                 DISCHARGE SUMMARY   CHIEF COMPLAINT:  Cervical lymphadenitis, fever, hypertension, remote breast  cancer.   DISCHARGE MEDICATIONS:  1.  Cleocin 600 mg three times daily for five additional days.  2.  Diovan HCT 160, 1 daily.  3.  Norvasc 5 mg daily.  4.  Potassium chloride 20 meq daily.   HISTORY OF PRESENT ILLNESS:  The patient is a 77 year old white female who  was initially evaluated as an outpatient two weeks prior to admission with a  fever and sore throat.  She was placed on Ceftin but progressively worsened  to the point of being unable to eat or drink due to severe sore throat.  She  developed worsening fever, weakness and prostration.  She was subsequently  admitted for further evaluation and treatment.   LABORATORY DATA AND HOSPITAL COURSE:  The patient was admitted with fever,  pharyngitis and right cervical adenopathy, the right greater than the left.  She was initially placed on Rocephin and vancomycin due to high fever.  A CT  scan of the neck with contrast was obtained that revealed multiple enlarged  lymph nodes but no definite abscess, neoplasm or signs of a septic deep vein  thrombosis.  Chest x-ray revealed no acute disease.    Dictation ended at this point.      PFK/MEDQ  D:  12/15/2004  T:  12/15/2004  Job:  [id number]

## 2011-01-17 NOTE — Discharge Summary (Signed)
[name] [name], [name] [name]               ACCOUNT NO.:  [id number]   MEDICAL RECORD NO.:  [id number]          PATIENT TYPE:  INP   LOCATION:  0376                         FACILITY:  [facility]   PHYSICIAN:  Gordy Savers, M.D. LHCDATE OF BIRTH:  [birth year]   DATE OF ADMISSION:  12/10/2004  DATE OF DISCHARGE:                                 DISCHARGE SUMMARY   FINAL DIAGNOSES:  1.  Pharyngitis with cervical lymphadenitis.  2.  Fever.  3.  Hypertension.  4.  Remote breast cancer.   DISCHARGE MEDICATIONS:  1.  Cleocin 600 mg three times daily for five daily.  2.  Diovan HCT 160/12.5 daily.  3.  Norvasc 5 mg daily.  4.  Potassium chloride 20 meq daily.   HISTORY OF PRESENT ILLNESS:  The patient is a 77 year old black female who  initially had the onset of fever and sore throat two weeks prior to  admission.  At that time, she was treated with Ceftin, but progressively  worsened with worsening fever, fatigue, sore throat, and inability to eat or  drink.  She was admitted to the hospital for further evaluation and  treatment.   On admission, the patient had a pharyngitis with cervical adenopathy.  She  was treated with Rocephin and Cleocin.  Antibiotic therapy was later  switched to Cleocin.  Evaluation included a CT scan of the neck with  contrast which revealed multiple large lymph nodes, especially on the right,  but no signs of abscess or septic deep vein thrombosis.   LABORATORY STUDIES:  White count of 11.1. Cardiac enzymes and thyroid tests  were normal.  Urinalysis revealed no pyuria.  Blood cultures were  subsequently negative.  Chemistries were unremarkable.   Hospital course was marked by steady clinical improvement with resolution of  her sore throat and largely resolved cervical lymphadenitis.  She was  febrile through the early hospital stay but with antibiotic therapy, the  patient was afebrile for the last 48 hours prior to discharge.   DISPOSITION:  The patient was  discharged to complete antibiotic therapy.  She was asked to follow up with her primary care physician in three days.   DISCHARGE CONDITION:  Improved.     PFK/MEDQ  D:  12/15/2004  T:  12/15/2004  Job:  [id number]

## 2011-01-17 NOTE — H&P (Signed)
[name], [name]               ACCOUNT NO.:  [id number]   MEDICAL RECORD NO.:  [id number]          PATIENT TYPE:  EMS   LOCATION:  ED                           FACILITY:  [facility]   PHYSICIAN:  Georgina Quint. Plotnikov, M.D. Romualdo Bolk OF BIRTH:  06/02/1933   DATE OF ADMISSION:  12/10/2004  DATE OF DISCHARGE:                                HISTORY & PHYSICAL   CHIEF COMPLAINT:  Weak and unable to eat or drink, sore throat and fever.   HISTORY OF PRESENT ILLNESS:  The patient is a 77 year old female who started  to have fever and sore throat about 2 weeks ago.  She saw Dr. Artist Pais on 11/26/04  and was given a course of Ceftin.  She has been feeling progressively worse  in spite of the antibiotic, very weak, tired, with high fevers, chills, body  aches.  She denies cough, abdominal pain, urinary symptoms and such, almost  passed out in the parking lot today when her legs buckled.  She did not pass  out all the way.  There was no head injury, no chest pain.  She has been  nauseated.  She ate last yesterday.   PAST MEDICAL HISTORY:  1.  Hypertension.  2.  Breast cancer.   FAMILY HISTORY:  Parents deceased.  Positive for heart disease.   SOCIAL HISTORY:  She is married, nonsmoker.   CURRENT MEDICATIONS:  1.  Ceftin since 11/26/04.  2.  She was also on Diovan HCT 160/12.5.  3.  Norvasc 5 mg daily.  4.  Potassium chloride 20 mEq daily.   REVIEW OF SYSTEMS:  As above, the rest is negative.   PHYSICAL EXAMINATION:  VITAL SIGNS:  Blood pressure 164/90, pulse 96,  temperature 101.9, weight 214 pounds.  GENERAL:  She is in mild acute distress, appears very tired.  HEENT:  Dry oral mucosa.  Dry lips.  Significant right tonsil enlargement  with white exudate.  NECK:  She has cervical lymphadenopathy, more on the right.  The lymph node  is tender.  LUNGS:  Clear, no wheezes or rales.  HEART:  S1 and S2, slight tachycardia.  ABDOMEN:  Soft and nontender.  No organomegaly, no mass felt.  EXTREMITIES:  Lower extremities without edema.  SKIN:  Clear, no lymphadenopathy in other areas, no meningeal signs.  NEUROLOGIC:  She is alert, oriented and cooperative.   ASSESSMENT/PLAN:  Problem 1.  Refractory fever of 2 week's duration with  tonsillitis, no response to Ceftin.  She also has accompanying adenopathy.  We will obtain lab work including CBC, urine test and others.  Obtain chest  x-ray, obtain blood culture.  Start patient on IV Rocephin  Problem 2.  Dehydration.  We will use IV fluids.  Hold blood pressure  medications.  Problem 3.  Near syncope due to problem #2.  Admit to telemetry.  Rule out  MI with series of enzymes and EKG.  Treat with IV fluids.  Problem 4.  Deep venous thrombosis prophylaxis.      AVP/MEDQ  D:  12/10/2004  T:  12/10/2004  Job:  [id number]

## 2011-01-17 NOTE — Op Note (Signed)
[name] [name], [name] [name]                         ACCOUNT NO.:  [id number]   MEDICAL RECORD NO.:  [id number]                   PATIENT TYPE:  AMB   LOCATION:  DSC                                  FACILITY:  [facility]   PHYSICIAN:  Ronnell Guadalajara, MD                  DATE OF BIRTH:  1933-09-01   DATE OF PROCEDURE:  06/27/2002  DATE OF DISCHARGE:                                 OPERATIVE REPORT   PREOPERATIVE DIAGNOSES:  Torn lateral meniscus, right knee.   POSTOPERATIVE DIAGNOSES:  Torn lateral meniscus, right knee.   OPERATION PERFORMED:  Partial lateral meniscectomy.   SURGEON:  Philips Montez Morita, MD   ANESTHESIA:   DESCRIPTION OF PROCEDURE:  After suitable anesthesia, the knee was prepped  and draped routinely.  Arthroscopy was carried out with a lateral  parapatellar inflow portal and an anterolateral portal for the scope and an  inferomedial portal made under direct visualization.  The medial joint  revealed minimal arthritic changes in normal looking meniscus and a bit of  patellofemoral disease but most of the pathology was in the lateral  compartment.  In order for better visualization, a Bovie was used to cut the  ligamentum mucosum so we could see.  There was extensive tearing of the  middle third of the lateral meniscus and some of the posterior horn.  The  extensive middle third was taken down to the total edge of the meniscus and  this was distal to where the popliteus tendon was.  The posterior meniscus  was trimmed where we could see the popliteus tendon but a good section of  the meniscus still remained, feeling that this could give some stability.  The anterior meniscus was trimmed back and smoothed down to its middle third  section.  This was accomplished with a 90 degree left angle punch coming in  through the medial portal and a straight punch and the small and large  rubber meniscotomes and 3.5 and 4.2.  in addition to work on the posterior  horns, specifically the  scope was passed through the medial side and the  straight punch and  rubber meniscotome used on the posterior horn to trim it  back, smooth it off.  At the end 20 cc of 0.5% plain Marcaine in to the knee  joint.  Portals were injected with 0.5% Marcaine with Adrenalin.  A  compression dressing at the end.  The surgery was done under the tourniquet  which was put on prior to the prepping and draping with the leg being  exsanguinated and the tourniquet inflated to 375 mmHg.  It was up less than  an hour.  She went to recovery in good condition.  Toradol 30 mg was given  IV during the procedure.  Ronnell Guadalajara, MD    PC/MEDQ  D:  06/27/2002  T:  06/27/2002  Job:  [id number]

## 2011-01-17 NOTE — Op Note (Signed)
NAMEANDREY, HOOBLER               ACCOUNT NO.:  [id number]   MEDICAL RECORD NO.:  [id number]          PATIENT TYPE:  AMB   LOCATION:  DSC                          FACILITY:  [facility]   PHYSICIAN:  David L. Annalee Genta, M.D.DATE OF BIRTH:  November 12, 1932   DATE OF PROCEDURE:  06/17/2004  DATE OF DISCHARGE:                                 OPERATIVE REPORT   PREOPERATIVE DIAGNOSES:  1.  Nasopharyngeal mass.  2.  Chronic headaches.  3.  History of breast cancer, status post mastectomy and radiation therapy.   POSTOPERATIVE DIAGNOSES:  1.  Nasopharyngeal mass.  2.  Chronic headaches.  3.  History of breast cancer, status post mastectomy and radiation therapy.   INDICATIONS FOR PROCEDURE:  1.  Nasopharyngeal mass.  2.  Chronic headaches.   SURGICAL PROCEDURE:  Biopsy of nasopharyngeal mass.   ANESTHESIA:  General endotracheal.   SURGEON:  Kinnie Scales. Annalee Genta, M.D.   ESTIMATED BLOOD LOSS:  Less than 50 cc.  No complications.  The patient  transferred from the operating room to recovery room in stable condition.   BRIEF HISTORY:  Mrs. Ferrufino is a 77 year old black female who is referred by  Dr. Lynelle Doctor (her neurologist) for evaluation of a nasopharyngeal  abnormality.  The patient has a longstanding history of chronic temporal  headaches.  Evaluation through Dr. Calton Dach office, including an MRI scan  was performed, and the patient was noted on the MRI scan to have possible  nasopharyngeal mass with irregular soft tissue in the nasopharynx.  Given  the patient's history, examination and findings in the office -- which on  direct nasal endoscopy, showed a exophytic soft tissue mass in the  nasopharynx.  I recommended to undertake a biopsy of the mass for tissue  diagnosis.  The risks, benefits and possible complications of this surgical  procedure were discussed in detail with the patient and her family.  They  understood and concurred with our plan for surgery -- which is scheduled  as  above.   DESCRIPTION OF SURGICAL PROCEDURE:  The patient brought to the operating  room on June 17, 2004 and placed in the supine position on the operating  room table.  General endotracheal anesthesia was established without  difficulty and the patient was adequately anesthetized.  Oral cavity and  oropharynx were examined.  There were no loose or broken teeth.  No evidence  of ulcer, mass or lesion.  The Crowe-Davis mouth gag was inserted without  difficulty, and the posterior nasopharynx was examined.   The patient was found to have a broadly based exophytic mass in the region  of the adenoids.  This might represent normal adenoidal tissue, but might  also represent a tumor.  Using precurved  St. Clair-Thompson forceps, multiple biopsies were taken of the  nasopharyngeal soft tissue.  This was sent to pathology for gross and  microscopic evaluation.  There was a small amount of bleeding, which was  cauterized with suction cautery.  Nasal cavity and nasopharynx were then  irrigated and suctioned, and orogastric tube was passed and stomach contents  were aspirated. The  Crowe-Davis mouth gag was released and removed.  The  patient was awakened from her anesthetic.  She was extubated and transferred  from the operating room to the recovery room in stable condition.   COMPLICATIONS:  There were no complications.   ESTIMATED BLOOD LOSS:  Less than 50 cc.       DLS/MEDQ  D:  19/14/7829  T:  06/17/2004  Job:  [id number]   cc:   Georgina Quint. Plotnikov, M.D. [facility]   Lynelle Doctor, M.D.

## 2011-01-31 ENCOUNTER — Telehealth: Payer: Self-pay | Admitting: Gastroenterology

## 2011-01-31 NOTE — Telephone Encounter (Signed)
Spoke with pt and she states that she doesn't know if she ate something last night that did not agree with her or not. She has had diarrhea and vomiting since last night and chills. Pt instructed to try some clear liquids and a bland diet that she may have a virus that has been going around and to let us know if there is no improvement. Pt verbalized understanding.

## 2011-02-19 ENCOUNTER — Other Ambulatory Visit: Payer: Self-pay | Admitting: Internal Medicine

## 2011-03-24 ENCOUNTER — Other Ambulatory Visit: Payer: Self-pay | Admitting: Internal Medicine

## 2011-03-24 MED ORDER — TRIAMCINOLONE ACETONIDE 0.5 % EX OINT: TOPICAL_OINTMENT | Freq: Two times a day (BID) | CUTANEOUS | Status: AC

## 2011-03-24 NOTE — Telephone Encounter (Signed)
Patient requesting RX for Ointment instead of cream, Done

## 2011-04-16 ENCOUNTER — Ambulatory Visit (INDEPENDENT_AMBULATORY_CARE_PROVIDER_SITE_OTHER): Payer: Medicare Other | Admitting: Internal Medicine

## 2011-04-16 ENCOUNTER — Other Ambulatory Visit (INDEPENDENT_AMBULATORY_CARE_PROVIDER_SITE_OTHER): Payer: Medicare Other

## 2011-04-16 ENCOUNTER — Other Ambulatory Visit (INDEPENDENT_AMBULATORY_CARE_PROVIDER_SITE_OTHER): Payer: Medicare Other | Admitting: Internal Medicine

## 2011-04-16 ENCOUNTER — Encounter: Payer: Self-pay | Admitting: Internal Medicine

## 2011-04-16 DIAGNOSIS — R6883 Chills (without fever): Secondary | ICD-10-CM

## 2011-04-16 DIAGNOSIS — R1032 Left lower quadrant pain: Secondary | ICD-10-CM | POA: Insufficient documentation

## 2011-04-16 DIAGNOSIS — R112 Nausea with vomiting, unspecified: Secondary | ICD-10-CM

## 2011-04-16 DIAGNOSIS — Z0000 Encounter for general adult medical examination without abnormal findings: Secondary | ICD-10-CM

## 2011-04-16 LAB — CBC WITH DIFFERENTIAL/PLATELET
Basophils Relative: 0.8 % (ref 0.0–3.0)
Eosinophils Absolute: 0.2 10*3/uL (ref 0.0–0.7)
HCT: 40.7 % (ref 36.0–46.0)
Hemoglobin: 13.2 g/dL (ref 12.0–15.0)
Lymphocytes Relative: 57.3 % — ABNORMAL HIGH (ref 12.0–46.0)
Lymphs Abs: 3.1 10*3/uL (ref 0.7–4.0)
MCHC: 32.4 g/dL (ref 30.0–36.0)
MCV: 86.8 fl (ref 78.0–100.0)
Monocytes Absolute: 0.4 10*3/uL (ref 0.1–1.0)
Neutro Abs: 1.7 10*3/uL (ref 1.4–7.7)
RBC: 4.69 Mil/uL (ref 3.87–5.11)

## 2011-04-16 LAB — COMPREHENSIVE METABOLIC PANEL
Alkaline Phosphatase: 70 U/L (ref 39–117)
BUN: 16 mg/dL (ref 6–23)
CO2: 32 mEq/L (ref 19–32)
Creatinine, Ser: 0.9 mg/dL (ref 0.4–1.2)
GFR: 75.34 mL/min (ref >60.00)
Glucose, Bld: 97 mg/dL (ref 70–99)
Sodium: 142 mEq/L (ref 135–145)
Total Bilirubin: 0.5 mg/dL (ref 0.3–1.2)
Total Protein: 8 g/dL (ref 6.0–8.3)

## 2011-04-16 LAB — URINALYSIS, ROUTINE W REFLEX MICROSCOPIC
Ketones, ur: NEGATIVE
Total Protein, Urine: NEGATIVE
Urine Glucose: NEGATIVE

## 2011-04-16 MED ORDER — AMLODIPINE BESYLATE 10 MG PO TABS: 10.0000 mg | ORAL_TABLET | Freq: Every day | ORAL | Status: DC

## 2011-04-16 MED ORDER — AMOXICILLIN-POT CLAVULANATE 875-125 MG PO TABS: 1.0000 | ORAL_TABLET | Freq: Two times a day (BID) | ORAL | Status: AC

## 2011-04-16 MED ORDER — HYDROCODONE-ACETAMINOPHEN 5-325 MG PO TABS: 2.0000 | ORAL_TABLET | Freq: Four times a day (QID) | ORAL | Status: DC | PRN

## 2011-04-16 MED ORDER — CLOTRIMAZOLE-BETAMETHASONE 1-0.05 % EX CREA: TOPICAL_CREAM | Freq: Two times a day (BID) | CUTANEOUS | Status: DC

## 2011-04-16 MED ORDER — LOSARTAN POTASSIUM 100 MG PO TABS: 100.0000 mg | ORAL_TABLET | Freq: Every day | ORAL | Status: DC

## 2011-04-16 MED ORDER — CIMETIDINE 200 MG PO TABS: 200.0000 mg | ORAL_TABLET | Freq: Two times a day (BID) | ORAL | Status: DC

## 2011-04-16 NOTE — Assessment & Plan Note (Signed)
Treat for presumed diverticulitis Get Labs

## 2011-04-16 NOTE — Patient Instructions (Signed)
Low residue diet x 10 days 

## 2011-04-16 NOTE — Assessment & Plan Note (Signed)
Will treat diverticulitis

## 2011-04-16 NOTE — Progress Notes (Signed)
-   Subjective:    Patient ID: [name] [name], female    DOB: [birth year], 77 y.o.   MRN: [id number]  HPI   The patient presents for a follow-up of  chronic hypertension, chronic dyslipidemia, type 2 diabetes controlled with medicines  C/o being cold all the time x 1 or 2 mo  C/o nausea w/meals  - new, occ vomiting. Using OTC Equate arthritis. She is on a new Ca pill  Review of Systems  Constitutional: Positive for chills and fatigue. Negative for fever, diaphoresis, activity change, appetite change and unexpected weight change.  HENT: Negative for hearing loss, ear pain, nosebleeds, congestion, sore throat, facial swelling, rhinorrhea, sneezing, mouth sores, trouble swallowing, neck pain, neck stiffness, postnasal drip, sinus pressure and tinnitus.   Eyes: Negative for pain, discharge, redness, itching and visual disturbance.  Respiratory: Negative for cough, chest tightness, shortness of breath, wheezing and stridor.   Cardiovascular: Negative for chest pain, palpitations and leg swelling.  Gastrointestinal: Positive for nausea, vomiting and abdominal pain. Negative for diarrhea, constipation, blood in stool, abdominal distention, anal bleeding and rectal pain.  Genitourinary: Negative for dysuria, urgency, frequency, hematuria, flank pain, vaginal bleeding, vaginal discharge, difficulty urinating, genital sores, vaginal pain and pelvic pain.  Musculoskeletal: Negative for back pain, joint swelling, arthralgias and gait problem.  Skin: Negative.  Negative for pallor and rash.  Neurological: Negative for dizziness, tremors, seizures, syncope, speech difficulty, weakness, numbness and headaches.  Hematological: Negative for adenopathy. Does not bruise/bleed easily.  Psychiatric/Behavioral: Negative for suicidal ideas, behavioral problems, confusion, sleep disturbance, dysphoric mood and decreased concentration. The patient is not nervous/anxious.    Wt Readings from Last 3 Encounters:    04/16/11 216 lb (97.977 kg)  01/08/11 215 lb (97.523 kg)  08/09/10 212 lb (96.163 kg)       Objective:   Physical Exam  Constitutional: She appears well-developed. No distress.       NAD  HENT:  Head: Normocephalic.  Right Ear: External ear normal.  Left Ear: External ear normal.  Nose: Nose normal.  Mouth/Throat: Oropharynx is clear and moist.  Eyes: Conjunctivae are normal. Pupils are equal, round, and reactive to light. Right eye exhibits no discharge. Left eye exhibits no discharge.  Neck: Normal range of motion. Neck supple. No JVD present. No tracheal deviation present. No thyromegaly present.  Cardiovascular: Normal rate, regular rhythm and normal heart sounds.   Pulmonary/Chest: No stridor. No respiratory distress. She has no wheezes.  Abdominal: Soft. Bowel sounds are normal. She exhibits no distension and no mass. There is tenderness (LLQ). There is no rebound and no guarding.  Musculoskeletal: She exhibits no edema and no tenderness.  Lymphadenopathy:    She has no cervical adenopathy.  Neurological: She displays normal reflexes. No cranial nerve deficit. She exhibits normal muscle tone. Coordination normal.  Skin: No rash noted. No erythema.  Psychiatric: She has a normal mood and affect. Her behavior is normal. Judgment and thought content normal.          Assessment & Plan:

## 2011-04-16 NOTE — Assessment & Plan Note (Signed)
Start Augmentin. Get labs.

## 2011-04-17 ENCOUNTER — Telehealth: Payer: Self-pay | Admitting: Internal Medicine

## 2011-04-17 NOTE — Telephone Encounter (Signed)
Pt informed

## 2011-04-17 NOTE — Telephone Encounter (Signed)
Stacey, please, inform patient that all labs are OK Thx   

## 2011-05-09 ENCOUNTER — Ambulatory Visit: Payer: Medicare Other | Admitting: Internal Medicine

## 2011-05-15 ENCOUNTER — Encounter (HOSPITAL_BASED_OUTPATIENT_CLINIC_OR_DEPARTMENT_OTHER): Payer: Medicare Other | Admitting: Oncology

## 2011-05-15 ENCOUNTER — Other Ambulatory Visit: Payer: Self-pay | Admitting: Oncology

## 2011-05-15 DIAGNOSIS — C50319 Malignant neoplasm of lower-inner quadrant of unspecified female breast: Secondary | ICD-10-CM

## 2011-05-15 DIAGNOSIS — Z171 Estrogen receptor negative status [ER-]: Secondary | ICD-10-CM

## 2011-05-15 DIAGNOSIS — Z86718 Personal history of other venous thrombosis and embolism: Secondary | ICD-10-CM

## 2011-05-15 DIAGNOSIS — Z170 Estrogen receptor positive status [ER+]: Secondary | ICD-10-CM

## 2011-05-15 LAB — CBC WITH DIFFERENTIAL/PLATELET
Basophils Absolute: 0.1 10*3/uL (ref 0.0–0.1)
EOS%: 2.3 % (ref 0.0–7.0)
HGB: 12.2 g/dL (ref 11.6–15.9)
MCH: 28.6 pg (ref 25.1–34.0)
NEUT#: 2 10*3/uL (ref 1.5–6.5)
RDW: 14.6 % — ABNORMAL HIGH (ref 11.2–14.5)
lymph#: 2.7 10*3/uL (ref 0.9–3.3)

## 2011-05-16 LAB — VITAMIN D 25 HYDROXY (VIT D DEFICIENCY, FRACTURES): Vit D, 25-Hydroxy: 42 ng/mL (ref 30–89)

## 2011-05-16 LAB — COMPREHENSIVE METABOLIC PANEL
CO2: 31 mEq/L (ref 19–32)
Creatinine, Ser: 0.94 mg/dL (ref 0.50–1.10)
Glucose, Bld: 93 mg/dL (ref 70–99)
Potassium: 3.9 mEq/L (ref 3.5–5.3)
Sodium: 140 mEq/L (ref 135–145)
Total Bilirubin: 0.3 mg/dL (ref 0.3–1.2)
Total Protein: 7.3 g/dL (ref 6.0–8.3)

## 2011-05-16 LAB — CANCER ANTIGEN 27.29: CA 27.29: 29 U/mL (ref 0–39)

## 2011-05-22 ENCOUNTER — Encounter (HOSPITAL_BASED_OUTPATIENT_CLINIC_OR_DEPARTMENT_OTHER): Payer: Medicare Other | Admitting: Oncology

## 2011-05-22 DIAGNOSIS — F329 Major depressive disorder, single episode, unspecified: Secondary | ICD-10-CM

## 2011-05-22 DIAGNOSIS — C50319 Malignant neoplasm of lower-inner quadrant of unspecified female breast: Secondary | ICD-10-CM

## 2011-10-10 ENCOUNTER — Ambulatory Visit (INDEPENDENT_AMBULATORY_CARE_PROVIDER_SITE_OTHER): Payer: Medicare Other | Admitting: Internal Medicine

## 2011-10-10 ENCOUNTER — Encounter: Payer: Self-pay | Admitting: Internal Medicine

## 2011-10-10 DIAGNOSIS — K5732 Diverticulitis of large intestine without perforation or abscess without bleeding: Secondary | ICD-10-CM

## 2011-10-10 DIAGNOSIS — R1032 Left lower quadrant pain: Secondary | ICD-10-CM

## 2011-10-10 DIAGNOSIS — K5792 Diverticulitis of intestine, part unspecified, without perforation or abscess without bleeding: Secondary | ICD-10-CM

## 2011-10-10 DIAGNOSIS — R112 Nausea with vomiting, unspecified: Secondary | ICD-10-CM

## 2011-10-10 DIAGNOSIS — K5730 Diverticulosis of large intestine without perforation or abscess without bleeding: Secondary | ICD-10-CM

## 2011-10-10 MED ORDER — PROMETHAZINE HCL 25 MG PO TABS: 25.0000 mg | ORAL_TABLET | Freq: Three times a day (TID) | ORAL | Status: DC | PRN

## 2011-10-10 MED ORDER — AMOXICILLIN-POT CLAVULANATE 875-125 MG PO TABS: 1.0000 | ORAL_TABLET | Freq: Two times a day (BID) | ORAL | Status: DC

## 2011-10-10 NOTE — Assessment & Plan Note (Signed)
See meds  CT abd 2011

## 2011-10-10 NOTE — Assessment & Plan Note (Signed)
See Meds 

## 2011-10-10 NOTE — Assessment & Plan Note (Signed)
Augmentin Low residue diet

## 2011-10-10 NOTE — Assessment & Plan Note (Signed)
noted 

## 2011-10-10 NOTE — Progress Notes (Signed)
Patient ID: [name] [name], female   DOB: 1933/05/18, 78 y.o.   MRN: [id number]  - Subjective:    Patient ID: [name] [name], female    DOB: [birth year], 78 y.o.   MRN: [id number]  Emesis  Associated symptoms include abdominal pain and chills. Pertinent negatives include no arthralgias, chest pain, coughing, diarrhea, dizziness, fever or headaches.         C/o nausea w/meals  - new, occ vomiting. C/o LLQ abd pain x 3 days - worse. No diarrhea. C/o chills  Review of Systems  Constitutional: Positive for chills and fatigue. Negative for fever, diaphoresis, activity change, appetite change and unexpected weight change.  HENT: Negative for hearing loss, ear pain, nosebleeds, congestion, sore throat, facial swelling, rhinorrhea, sneezing, mouth sores, trouble swallowing, neck pain, neck stiffness, postnasal drip, sinus pressure and tinnitus.   Eyes: Negative for pain, discharge, redness, itching and visual disturbance.  Respiratory: Negative for cough, chest tightness, shortness of breath, wheezing and stridor.   Cardiovascular: Negative for chest pain, palpitations and leg swelling.  Gastrointestinal: Positive for nausea, vomiting and abdominal pain. Negative for diarrhea, constipation, blood in stool, abdominal distention, anal bleeding and rectal pain.  Genitourinary: Negative for dysuria, urgency, frequency, hematuria, flank pain, vaginal bleeding, vaginal discharge, difficulty urinating, genital sores, vaginal pain and pelvic pain.  Musculoskeletal: Negative for back pain, joint swelling, arthralgias and gait problem.  Skin: Negative.  Negative for pallor and rash.  Neurological: Negative for dizziness, tremors, seizures, syncope, speech difficulty, weakness, numbness and headaches.  Hematological: Negative for adenopathy. Does not bruise/bleed easily.  Psychiatric/Behavioral: Negative for suicidal ideas, behavioral problems, confusion, sleep disturbance, dysphoric mood and decreased  concentration. The patient is not nervous/anxious.    Wt Readings from Last 3 Encounters:  10/10/11 205 lb (92.987 kg)  04/16/11 216 lb (97.977 kg)  01/08/11 215 lb (97.523 kg)       Objective:   Physical Exam  Constitutional: She appears well-developed. No distress.       NAD  HENT:  Head: Normocephalic.  Right Ear: External ear normal.  Left Ear: External ear normal.  Nose: Nose normal.  Mouth/Throat: Oropharynx is clear and moist.  Eyes: Conjunctivae are normal. Pupils are equal, round, and reactive to light. Right eye exhibits no discharge. Left eye exhibits no discharge.  Neck: Normal range of motion. Neck supple. No JVD present. No tracheal deviation present. No thyromegaly present.  Cardiovascular: Normal rate, regular rhythm and normal heart sounds.   Pulmonary/Chest: No stridor. No respiratory distress. She has no wheezes.  Abdominal: Soft. Bowel sounds are normal. She exhibits no distension and no mass. There is tenderness (LLQ). There is no rebound and no guarding.  Musculoskeletal: She exhibits no edema and no tenderness.  Lymphadenopathy:    She has no cervical adenopathy.  Neurological: She displays normal reflexes. No cranial nerve deficit. She exhibits normal muscle tone. Coordination normal.  Skin: No rash noted. No erythema.  Psychiatric: She has a normal mood and affect. Her behavior is normal. Judgment and thought content normal.          Assessment & Plan:

## 2011-10-10 NOTE — Patient Instructions (Signed)
Low residue diet

## 2011-11-12 ENCOUNTER — Other Ambulatory Visit (HOSPITAL_BASED_OUTPATIENT_CLINIC_OR_DEPARTMENT_OTHER): Payer: Medicare Other | Admitting: Lab

## 2011-11-12 DIAGNOSIS — C50319 Malignant neoplasm of lower-inner quadrant of unspecified female breast: Secondary | ICD-10-CM

## 2011-11-12 DIAGNOSIS — Z86718 Personal history of other venous thrombosis and embolism: Secondary | ICD-10-CM

## 2011-11-12 LAB — COMPREHENSIVE METABOLIC PANEL
ALT: 11 U/L (ref 0–35)
AST: 16 U/L (ref 0–37)
Albumin: 4.1 g/dL (ref 3.5–5.2)
BUN: 12 mg/dL (ref 6–23)
Calcium: 9.5 mg/dL (ref 8.4–10.5)
Chloride: 102 mEq/L (ref 96–112)
Potassium: 3.6 mEq/L (ref 3.5–5.3)
Total Protein: 7.2 g/dL (ref 6.0–8.3)

## 2011-11-12 LAB — CBC WITH DIFFERENTIAL/PLATELET
Basophils Absolute: 0 10*3/uL (ref 0.0–0.1)
EOS%: 2.5 % (ref 0.0–7.0)
Eosinophils Absolute: 0.1 10*3/uL (ref 0.0–0.5)
HGB: 12.6 g/dL (ref 11.6–15.9)
MCH: 28.5 pg (ref 25.1–34.0)
NEUT#: 1.6 10*3/uL (ref 1.5–6.5)
RDW: 14.9 % — ABNORMAL HIGH (ref 11.2–14.5)
lymph#: 2.8 10*3/uL (ref 0.9–3.3)

## 2011-11-12 LAB — CANCER ANTIGEN 27.29: CA 27.29: 33 U/mL (ref 0–39)

## 2011-11-18 ENCOUNTER — Other Ambulatory Visit: Payer: Self-pay | Admitting: Internal Medicine

## 2011-11-19 ENCOUNTER — Encounter: Payer: Self-pay | Admitting: Physician Assistant

## 2011-11-19 ENCOUNTER — Telehealth: Payer: Self-pay | Admitting: *Deleted

## 2011-11-19 ENCOUNTER — Ambulatory Visit (HOSPITAL_COMMUNITY)
Admission: RE | Admit: 2011-11-19 | Discharge: 2011-11-19 | Disposition: A | Discharge status: Home / Self Care | Payer: Medicare Other | Source: Ambulatory Visit | Attending: Physician Assistant | Admitting: Physician Assistant

## 2011-11-19 ENCOUNTER — Ambulatory Visit (HOSPITAL_BASED_OUTPATIENT_CLINIC_OR_DEPARTMENT_OTHER): Payer: Medicare Other | Admitting: Physician Assistant

## 2011-11-19 VITALS — BP 154/75 | HR 76 | Temp 98.1°F | Ht 65.0 in | Wt 216.9 lb

## 2011-11-19 DIAGNOSIS — I517 Cardiomegaly: Secondary | ICD-10-CM | POA: Insufficient documentation

## 2011-11-19 DIAGNOSIS — Z853 Personal history of malignant neoplasm of breast: Secondary | ICD-10-CM

## 2011-11-19 DIAGNOSIS — Z170 Estrogen receptor positive status [ER+]: Secondary | ICD-10-CM

## 2011-11-19 DIAGNOSIS — Z9010 Acquired absence of unspecified breast and nipple: Secondary | ICD-10-CM

## 2011-11-19 DIAGNOSIS — Z79811 Long term (current) use of aromatase inhibitors: Secondary | ICD-10-CM

## 2011-11-19 DIAGNOSIS — C50912 Malignant neoplasm of unspecified site of left female breast: Secondary | ICD-10-CM

## 2011-11-19 DIAGNOSIS — C50919 Malignant neoplasm of unspecified site of unspecified female breast: Secondary | ICD-10-CM | POA: Insufficient documentation

## 2011-11-19 DIAGNOSIS — C50911 Malignant neoplasm of unspecified site of right female breast: Secondary | ICD-10-CM

## 2011-11-19 MED ORDER — EXEMESTANE 25 MG PO TABS: 25.0000 mg | ORAL_TABLET | Freq: Every day | ORAL | Status: AC

## 2011-11-19 NOTE — Telephone Encounter (Signed)
patient called in and reschedule her follow up for 01-21-2012 starting at 2:00pm patient confirmed over the phone 

## 2011-11-19 NOTE — Progress Notes (Signed)
ID: [name] [name]   DOB: 07/10/1933  MR#: [id number]  BJY#:[id number]  HISTORY OF PRESENT ILLNESS: The patient has a remote history of left breast carcinoma, and is status post modified radical mastectomy in July of 1997. This was for a 2 cm medullary tumor. She  was treated in the neoadjuvant setting with doxorubicin and paclitaxel, followed by mastectomy which revealed 5 of 6 nodes positive. She received postmastectomy treatment including CM half for this triple negative lesion, followed by radiation with all treatment completed in January of 1998.  More recently, screening mammography of the right breast August 11, 2008 showed a possible mass and the patient was recalled for further views on December 22nd.  Dr. Deboraha Sprang was not able to find a mass by palpation.  Magnification views of the right breast showed only a small slightly irregular density in the lower inner quadrant anteriorly. There were some punctate calcifications.  These had not changed.  Ultrasound showed no definite mass.    This was sufficiently suspicious that Dr. Deboraha Sprang felt it best to proceed to stereotactic biopsy.  This was performed on December 28th and the pathology ([id number] and [id number]) showed an invasive ductal carcinoma, grade 2, with evidence of lymphovascular invasion, the core length being 8 mm which would be the minimal length of this tumor.  The prognostic panel showed this to be ER 100% positive, PR 95% positive, with an MIB-1 of 22% and with a CISH ratio of 1.95.    With this information, the patient met with Dr. Freida Busman and decided to have a mastectomy (this is what she did with her earlier left-sided cancer).  She underwent her definitive surgery September 13, 2008 under [facility].  The pathology ([id number]) showed a 1.7 cm invasive ductal carcinoma, grade 3, with ample margins and no lymph node involvement (the single sentinel lymph node was negative).  Following surgery, the patient tried but letrozole on anastrozole,  both of which she tolerated poorly. She has a remote history of left lower extremity DVT associated with trauma, so tamoxifen was not tried. She was started on exemestane in March of 2012, and continues with good tolerance.  INTERVAL HISTORY: [name] [name] returns today for routine six-month followup of her right breast carcinoma. She continues to tolerate the exemestane well, and interval history is remarkable only for some "stomach problems".  These are being followed by Dr. Posey Rea as well as Dr. Arlyce Dice.  REVIEW OF SYSTEMS: [name] [name] has occasional hot flashes associated with her exemestane but notes that these are tolerable. She's had no increased joint pain. No abnormal headaches or pain elsewhere. No vaginal dryness or discharge, and no abnormal vaginal bleeding. She has been to hold she has an "ulcer" and is on medication accordingly. She has some occasional nausea, and certain foods "irritate her stomach".  A detailed review of systems is otherwise noncontributory.  PAST MEDICAL HISTORY: Past Medical History  Diagnosis Date  . Anemia     iron deficiency  . Diverticulosis of colon 2004    Dr Arlyce Dice  . Hyperlipidemia   . Hypertension   . Vitamin d deficiency   . Vitamin B12 deficiency   . Hematuria     Dr Vonita Moss  . Depression 2009    grief  . Osteoarthritis, knee   . Cancer     breast, skin  . GERD (gastroesophageal reflux disease)     PAST SURGICAL HISTORY: Past Surgical History  Procedure Date  . Total knee arthroplasty 06-04-98  . Mastectomy 09-13-08  left and right  . L groin skin cancer 2011    FAMILY HISTORY Family History  Problem Relation Age of Onset  . Cancer Sister     breast  . Coronary artery disease Other   . Cancer Other     aunt, niece    GYNECOLOGIC HISTORY:  SOCIAL HISTORY: [name] [name]'s husband died in 2009-12-20from complications of his renal cell carcinoma.  The patient is adjusting to living by herself and she does have a lot of family in  town:  Daughter [name] [name] 53, son [name] [name] 22, son [name] [name] 22, daughter [name] [name] 15 and daughter [name] [name], 38.  The patient has too many grandchildren and great grandchildren to count, she says.    ADVANCED DIRECTIVES:  HEALTH MAINTENANCE: History  Substance Use Topics  . Smoking status: Never Smoker   . Smokeless tobacco: Not on file  . Alcohol Use: No     Colonoscopy:  PAP:  Bone density:  Lipid panel:  Allergies  Allergen Reactions  . Anastrozole     REACTION: arthralgias  . Aspirin   . Ciprofloxacin   . Codeine   . Letrozole     REACTION: leg pain    Current Outpatient Prescriptions  Medication Sig Dispense Refill  . amLODipine (NORVASC) 10 MG tablet Take 1 tablet (10 mg total) by mouth daily.  90 tablet  3  . Cholecalciferol (EQL VITAMIN D3) 1000 UNITS tablet Take 1,000 Units by mouth daily.        . Cholecalciferol (VITAMIN D3) 50000 UNITS CAPS Take by mouth once a week. X 6 weeks       . cimetidine (TAGAMET) 200 MG tablet Take 1 tablet (200 mg total) by mouth 2 (two) times daily.  180 tablet  3  . clotrimazole-betamethasone (LOTRISONE) cream Apply topically 2 (two) times daily.  45 g  3  . dexlansoprazole (DEXILANT) 60 MG capsule Take 60 mg by mouth daily.        Marland Kitchen FLUoxetine (PROZAC) 10 MG capsule Take 10 mg by mouth daily.        Marland Kitchen HYDROcodone-acetaminophen (NORCO) 5-325 MG per tablet Take 2 tablets by mouth every 6 (six) hours as needed.  180 tablet  3  . ibuprofen (ADVIL,MOTRIN) 600 MG tablet Take 600 mg by mouth as needed.        Marland Kitchen KLOR-CON M20 20 MEQ tablet TAKE 1 TABLET UP TO 3 TIMES TODAY AS DIRECTED  90 tablet  2  . loratadine (CLARITIN) 10 MG tablet Take 10 mg by mouth daily.        Marland Kitchen losartan (COZAAR) 100 MG tablet Take 1 tablet (100 mg total) by mouth daily.  90 tablet  3  . NON FORMULARY Prevpak- take as directed       . Probiotic Product (ALIGN) 4 MG CAPS Take 1 capsule by mouth daily.        . promethazine (PHENERGAN) 25 MG tablet Take 25 mg by mouth every 6  (six) hours as needed.      . traMADol (ULTRAM) 50 MG tablet Take by mouth 2 (two) times daily as needed. Take 1-2 tabs bid       . triamcinolone (KENALOG) 0.5 % cream Apply topically 2 (two) times daily.        Marland Kitchen triamcinolone (KENALOG) 0.5 % ointment Apply topically 2 (two) times daily.  90 g  0  . vitamin B-12 (CYANOCOBALAMIN) 1000 MCG tablet Take by mouth daily.        Marland Kitchen exemestane (  AROMASIN) 25 MG tablet Take 1 tablet (25 mg total) by mouth daily after breakfast.  30 tablet  11    OBJECTIVE: Filed Vitals:   11/19/11 0938  BP: 154/75  Pulse: 76  Temp: 98.1 F (36.7 C)     Body mass index is 36.09 kg/(m^2).    ECOG FS: 0  Physical Exam: HEENT:  Sclerae anicteric, conjunctivae pink.  Oropharynx clear.  No mucositis or candidiasis.   Nodes:  No cervical, supraclavicular, or axillary lymphadenopathy palpated.  Breast Exam:  Status post bilateral mastectomies with well-healed incisions, no suspicious nodularities, no skin changes, no evidence of local recurrence on either the left or the right.  Lungs:  Clear to auscultation bilaterally.  No crackles, rhonchi, or wheezes.   Heart:  Regular rate and rhythm.   Abdomen:  Soft, obese, nontender.  Positive bowel sounds.  No organomegaly or masses palpated.   Musculoskeletal:  No focal spinal tenderness to palpation.  Extremities:  Benign.  No peripheral edema or cyanosis.   Skin:  Benign.   Neuro:  Nonfocal. Alert and oriented x3.    LAB RESULTS: Lab Results  Component Value Date   WBC 5.0 11/12/2011   NEUTROABS 1.6 11/12/2011   HGB 12.6 11/12/2011   HCT 37.7 11/12/2011   MCV 85.1 11/12/2011   PLT 259 11/12/2011      Chemistry      Component Value Date/Time   NA 140 11/12/2011 0952   K 3.6 11/12/2011 0952   CL 102 11/12/2011 0952   CO2 30 11/12/2011 0952   BUN 12 11/12/2011 0952   CREATININE 0.87 11/12/2011 0952      Component Value Date/Time   CALCIUM 9.5 11/12/2011 0952   ALKPHOS 61 11/12/2011 0952   AST 16 11/12/2011 0952   ALT  11 11/12/2011 0952   BILITOT 0.4 11/12/2011 0952       Lab Results  Component Value Date   LABCA2 33 11/12/2011    STUDIES: No results found.  ASSESSMENT: A 78 year old [place] woman with a history of:  1. Left breast cancer, status post modified radical mastectomy July 1997 for a 2-cm medullary tumor, treated neoadjuvantly with paclitaxel and doxorubicin, mastectomy revealing 5 out of 6 nodes involved, post mastectomy treatment including CMF (for this triple-negative lesion), followed by radiation, completed January 1998.  2. Status post right mastectomy January 2010 for a T1c N0 invasive ductal carcinoma, grade 2, strongly estrogen and progesterone receptor positive, HER2 negative, with an MIB-1 of 22%.  She tolerated letrozole and anastrozole poorly and she had a remote history of left lower extremity deep venous thrombosis associated with trauma, so we did not try tamoxifen.  She was started on exemestane March 2012 and continues on that medicine with good tolerance.    PLAN: As regards her breast cancer, [name] [name] appears to be doing well there is no evidence of disease recurrence at this time. She is going for a chest x-ray later today for further evaluation since she is status post bilateral mastectomies, some coarse we're not getting annual mammograms.  Otherwise [name] [name] will return in 6 months for routine followup. She will continue on the exemestane which she is tolerating well and I have refilled a prescription for her today. The plan is to continue the exemestane for total of 5 years.  Jenesa Foresta    11/19/2011

## 2011-12-05 ENCOUNTER — Other Ambulatory Visit: Payer: Self-pay | Admitting: *Deleted

## 2011-12-05 MED ORDER — AMOXICILLIN-POT CLAVULANATE 875-125 MG PO TABS: 1.0000 | ORAL_TABLET | Freq: Two times a day (BID) | ORAL | Status: AC

## 2011-12-09 ENCOUNTER — Other Ambulatory Visit: Payer: Self-pay | Admitting: *Deleted

## 2011-12-09 MED ORDER — LOSARTAN POTASSIUM 100 MG PO TABS: 100.0000 mg | ORAL_TABLET | Freq: Every day | ORAL | Status: DC

## 2011-12-09 MED ORDER — AMLODIPINE BESYLATE 10 MG PO TABS: 10.0000 mg | ORAL_TABLET | Freq: Every day | ORAL | Status: DC

## 2011-12-09 NOTE — Progress Notes (Signed)
Addended by: Merrilyn Puma on: 12/09/2011 11:07 AM   Modules accepted: Orders

## 2012-01-06 ENCOUNTER — Other Ambulatory Visit (INDEPENDENT_AMBULATORY_CARE_PROVIDER_SITE_OTHER): Payer: Medicare Other

## 2012-01-06 ENCOUNTER — Encounter: Payer: Self-pay | Admitting: Internal Medicine

## 2012-01-06 ENCOUNTER — Ambulatory Visit (INDEPENDENT_AMBULATORY_CARE_PROVIDER_SITE_OTHER): Payer: Medicare Other | Admitting: Internal Medicine

## 2012-01-06 VITALS — BP 120/72 | HR 62 | Temp 97.6°F | Ht 66.0 in | Wt 207.1 lb

## 2012-01-06 DIAGNOSIS — R221 Localized swelling, mass and lump, neck: Secondary | ICD-10-CM | POA: Insufficient documentation

## 2012-01-06 DIAGNOSIS — F329 Major depressive disorder, single episode, unspecified: Secondary | ICD-10-CM

## 2012-01-06 DIAGNOSIS — R220 Localized swelling, mass and lump, head: Secondary | ICD-10-CM

## 2012-01-06 DIAGNOSIS — I10 Essential (primary) hypertension: Secondary | ICD-10-CM

## 2012-01-06 LAB — BASIC METABOLIC PANEL
Calcium: 9.5 mg/dL (ref 8.4–10.5)
GFR: 97.88 mL/min (ref >60.00)
Glucose, Bld: 93 mg/dL (ref 70–99)
Potassium: 3.7 mEq/L (ref 3.5–5.1)
Sodium: 141 mEq/L (ref 135–145)

## 2012-01-06 NOTE — Assessment & Plan Note (Signed)
I hope benign such as salivary gland related, but cant r/o other;  Will ask for ct with contrast, refer ent, no other tx at this time, ok to cont the augmentin for presumed diverticulitis flare but doubt will help this

## 2012-01-06 NOTE — Progress Notes (Signed)
Subjective:    Patient ID: [name] [name], female    DOB: 20-Feb-1933, 78 y.o.   MRN: [id number]  HPI  78yo BF pt of Dr Posey Rea, currently on augmentin course for presumed diverticulitis, has hx of breast ca with normal cbc, cmet and Ca-27 and cxr neg  per Dr Darnelle Catalan mar 2013.  Here with new know to the left neck , just noticed yesterday, feels hard, not painful, not really mobile, with ST, sinus symtpoms, but does have cough few intermittent non prod she though related to allergies since onset about 1 month.  No HA, hoarsess ,voice changes or wheezing or cough.  Was hospd at one point remotely approx 5 yrs and had neck tender at that time, but none since.  No recurrence of breast ca per pt per oncology.   Pt denies fever, wt loss, night sweats, loss of appetite, or other constitutional symptoms.  Denies hyper or hypo thyroid symptoms such as voice, skin or hair change.  Does have hx of bresat ca and skin cancer.  Never smoked. Denies worsening depressive symptoms, suicidal ideation, or panic  Past Medical History  Diagnosis Date  . Anemia     iron deficiency  . Diverticulosis of colon 2004    Dr Arlyce Dice  . Hyperlipidemia   . Hypertension   . Vitamin d deficiency   . Vitamin B12 deficiency   . Hematuria     Dr Vonita Moss  . Depression 2009    grief  . Osteoarthritis, knee   . Cancer     breast, skin  . GERD (gastroesophageal reflux disease)    Past Surgical History  Procedure Date  . Total knee arthroplasty 06-04-98  . Mastectomy 09-13-08    left and right  . L groin skin cancer 2011    reports that she has never smoked. She does not have any smokeless tobacco history on file. She reports that she does not drink alcohol or use illicit drugs. family history includes Cancer in her other and sister and Coronary artery disease in her other. Allergies  Allergen Reactions  . Anastrozole     REACTION: arthralgias  . Aspirin   . Ciprofloxacin   . Codeine   . Letrozole     REACTION:  leg pain   Current Outpatient Prescriptions on File Prior to Visit  Medication Sig Dispense Refill  . amLODipine (NORVASC) 10 MG tablet Take 1 tablet (10 mg total) by mouth daily.  90 tablet  3  . Cholecalciferol (EQL VITAMIN D3) 1000 UNITS tablet Take 1,000 Units by mouth daily.        . Cholecalciferol (VITAMIN D3) 50000 UNITS CAPS Take by mouth once a week. X 6 weeks       . cimetidine (TAGAMET) 200 MG tablet Take 1 tablet (200 mg total) by mouth 2 (two) times daily.  180 tablet  3  . clotrimazole-betamethasone (LOTRISONE) cream Apply topically 2 (two) times daily.  45 g  3  . dexlansoprazole (DEXILANT) 60 MG capsule Take 60 mg by mouth daily.        Marland Kitchen FLUoxetine (PROZAC) 10 MG capsule Take 10 mg by mouth daily.        Marland Kitchen HYDROcodone-acetaminophen (NORCO) 5-325 MG per tablet Take 2 tablets by mouth every 6 (six) hours as needed.  180 tablet  3  . ibuprofen (ADVIL,MOTRIN) 600 MG tablet Take 600 mg by mouth as needed.        Marland Kitchen KLOR-CON M20 20 MEQ tablet TAKE 1 TABLET  UP TO 3 TIMES TODAY AS DIRECTED  90 tablet  2  . loratadine (CLARITIN) 10 MG tablet Take 10 mg by mouth daily.        Marland Kitchen losartan (COZAAR) 100 MG tablet Take 1 tablet (100 mg total) by mouth daily.  90 tablet  3  . NON FORMULARY Prevpak- take as directed       . Probiotic Product (ALIGN) 4 MG CAPS Take 1 capsule by mouth daily.        . promethazine (PHENERGAN) 25 MG tablet TAKE 1 TABLET (25 MG TOTAL) BY MOUTH EVERY 8 (EIGHT) HOURS AS NEEDED FOR NAUSEA.  60 tablet  0  . promethazine (PHENERGAN) 25 MG tablet Take 25 mg by mouth every 6 (six) hours as needed.      . traMADol (ULTRAM) 50 MG tablet Take by mouth 2 (two) times daily as needed. Take 1-2 tabs bid       . triamcinolone (KENALOG) 0.5 % cream Apply topically 2 (two) times daily.        Marland Kitchen triamcinolone (KENALOG) 0.5 % ointment Apply topically 2 (two) times daily.  90 g  0  . vitamin B-12 (CYANOCOBALAMIN) 1000 MCG tablet Take by mouth daily.         Review of Systems Review  of Systems  Constitutional: Negative for diaphoresis and unexpected weight change.  HENT: Negative for drooling and tinnitus.   Eyes: Negative for photophobia and visual disturbance.  Respiratory: Negative for choking and stridor.   Gastrointestinal: Negative for vomiting and blood in stool.  Genitourinary: Negative for hematuria and decreased urine volume.  Musculoskeletal: walks with cane Skin: Negative for color change and wound.  Neurological: Negative for tremors and numbness.  Psychiatric/Behavioral: Negative for decreased concentration. The patient is not hyperactive.       Objective:   Physical Exam BP 120/72  Pulse 62  Temp(Src) 97.6 F (36.4 C) (Oral)  Ht 5\' 6"  (1.676 m)  Wt 207 lb 2 oz (93.951 kg)  BMI 33.43 kg/m2  SpO2 96% Physical Exam  VS noted, not ill appearing Constitutional: Pt appears well-developed and well-nourished.  HENT: Head: Normocephalic.  Right Ear: External ear normal.  Left Ear: External ear normal.  Bilat tm's clear; dentition fair, several teeth pulled left lower but no gingivitis or swelling Sinus nontender Eyes: Conjunctivae and EOM are normal. Pupils are equal, round, and reactive to light.  Neck: Normal range of motion. Neck supple. Does have nondiscrete immobile nontender hard mass left neck below jaw line, no other LA noted, thyroid without goiter or mass I can appreciate Cardiovascular: Normal rate and regular rhythm.   Pulmonary/Chest: Effort normal and breath sounds normal.  Abd:  Soft, NT, non-distended, + BS Neurological: Pt is alert. No cranial nerve deficit.  Skin: Skin is warm. No erythema.  Psychiatric: Pt behavior is normal. Thought content normal.         Assessment & Plan:

## 2012-01-06 NOTE — Assessment & Plan Note (Signed)
stable overall by hx and exam, most recent data reviewed with pt, and pt to continue medical treatment as before BP Readings from Last 3 Encounters:  01/06/12 120/72  11/19/11 154/75  10/10/11 138/80

## 2012-01-06 NOTE — Assessment & Plan Note (Signed)
stable overall by hx and exam, most recent data reviewed with pt, and pt to continue medical treatment as before Lab Results  Component Value Date   WBC 5.0 11/12/2011   HGB 12.6 11/12/2011   HCT 37.7 11/12/2011   PLT 259 11/12/2011   GLUCOSE 82 11/12/2011   ALT 11 11/12/2011   AST 16 11/12/2011   NA 140 11/12/2011   K 3.6 11/12/2011   CL 102 11/12/2011   CREATININE 0.87 11/12/2011   BUN 12 11/12/2011   CO2 30 11/12/2011   TSH 1.34 08/08/2010

## 2012-01-06 NOTE — Patient Instructions (Signed)
Continue all other medications as before You will be contacted regarding the referral for: CT scan for the neck, and ENT referral Please go to LAB in the Basement for the blood and/or urine tests to be done today - need to do this to check kidney function so the CT scan can be done You will be contacted by phone if any changes need to be made immediately.  Otherwise, you will receive a letter about your results with an explanation.

## 2012-01-14 ENCOUNTER — Other Ambulatory Visit: Payer: Medicare Other

## 2012-01-21 ENCOUNTER — Ambulatory Visit (INDEPENDENT_AMBULATORY_CARE_PROVIDER_SITE_OTHER)
Admission: RE | Admit: 2012-01-21 | Discharge: 2012-01-21 | Disposition: A | Discharge status: Home / Self Care | Payer: Medicare Other | Source: Ambulatory Visit | Attending: Internal Medicine | Admitting: Internal Medicine

## 2012-01-21 DIAGNOSIS — R221 Localized swelling, mass and lump, neck: Secondary | ICD-10-CM

## 2012-01-21 MED ORDER — IOHEXOL 300 MG/ML  SOLN
75.0000 mL | Freq: Once | INTRAMUSCULAR | Status: AC | PRN
  Administered 2012-01-21: 75 mL via INTRAVENOUS

## 2012-05-13 ENCOUNTER — Other Ambulatory Visit (HOSPITAL_BASED_OUTPATIENT_CLINIC_OR_DEPARTMENT_OTHER): Payer: Medicare Other | Admitting: Lab

## 2012-05-13 ENCOUNTER — Other Ambulatory Visit: Payer: Self-pay | Admitting: *Deleted

## 2012-05-13 DIAGNOSIS — E559 Vitamin D deficiency, unspecified: Secondary | ICD-10-CM

## 2012-05-13 DIAGNOSIS — D0590 Unspecified type of carcinoma in situ of unspecified breast: Secondary | ICD-10-CM

## 2012-05-13 DIAGNOSIS — C50319 Malignant neoplasm of lower-inner quadrant of unspecified female breast: Secondary | ICD-10-CM

## 2012-05-13 LAB — COMPREHENSIVE METABOLIC PANEL (CC13)
ALT: 11 U/L (ref 0–55)
AST: 15 U/L (ref 5–34)
Alkaline Phosphatase: 72 U/L (ref 40–150)
CO2: 28 mEq/L (ref 22–29)
Sodium: 141 mEq/L (ref 136–145)
Total Bilirubin: 0.6 mg/dL (ref 0.20–1.20)
Total Protein: 7.6 g/dL (ref 6.4–8.3)

## 2012-05-13 LAB — CBC WITH DIFFERENTIAL/PLATELET
BASO%: 1.3 % (ref 0.0–2.0)
EOS%: 2.2 % (ref 0.0–7.0)
LYMPH%: 50.3 % — ABNORMAL HIGH (ref 14.0–49.7)
MCH: 28.4 pg (ref 25.1–34.0)
MCHC: 33.3 g/dL (ref 31.5–36.0)
MONO#: 0.3 10*3/uL (ref 0.1–0.9)
MONO%: 6.8 % (ref 0.0–14.0)
Platelets: 274 10*3/uL (ref 145–400)
RBC: 4.72 10*6/uL (ref 3.70–5.45)
WBC: 5 10*3/uL (ref 3.9–10.3)

## 2012-05-20 ENCOUNTER — Ambulatory Visit (HOSPITAL_BASED_OUTPATIENT_CLINIC_OR_DEPARTMENT_OTHER): Payer: Medicare Other | Admitting: Oncology

## 2012-05-20 ENCOUNTER — Telehealth: Payer: Self-pay | Admitting: *Deleted

## 2012-05-20 VITALS — BP 123/67 | HR 52 | Temp 98.0°F | Resp 20 | Ht 66.0 in | Wt 220.1 lb

## 2012-05-20 DIAGNOSIS — C50319 Malignant neoplasm of lower-inner quadrant of unspecified female breast: Secondary | ICD-10-CM

## 2012-05-20 DIAGNOSIS — Z853 Personal history of malignant neoplasm of breast: Secondary | ICD-10-CM

## 2012-05-20 DIAGNOSIS — Z170 Estrogen receptor positive status [ER+]: Secondary | ICD-10-CM

## 2012-05-20 DIAGNOSIS — Z86718 Personal history of other venous thrombosis and embolism: Secondary | ICD-10-CM

## 2012-05-20 DIAGNOSIS — C50919 Malignant neoplasm of unspecified site of unspecified female breast: Secondary | ICD-10-CM

## 2012-05-20 MED ORDER — EXEMESTANE 25 MG PO TABS: 25.0000 mg | ORAL_TABLET | Freq: Every day | ORAL | Status: DC

## 2012-05-20 NOTE — Telephone Encounter (Signed)
Mailed out calendar to inform the patient of the new date and time 

## 2012-05-20 NOTE — Progress Notes (Signed)
ID: [name]   DOB: 06/23/1933  MR#: [id number]  WUJ#:[id number]  PCP: Sonda Primes, MD GYN:  SU:  OTHER MD:   INTERVAL HISTORY: Kendis returns today  for followup of her remote breast cancer. The interval history is generally unremarkable. In particular she is tolerating her exemestane without significant arthralgias/myalgias. She denies hot flashes and tells me vaginal dryness is not a concern   REVIEW OF SYSTEMS: She has problems with insomnia. Occasionally she has some abdominal pain, sometimes relieved by bowel movements, sometimes by passing gas. She has stress urinary incontinence, which is not a new finding. She has osteoarthritis issues which are long-standing. Otherwise a detailed review of systems today was stable.  PAST MEDICAL HISTORY: Past Medical History  Diagnosis Date  . Anemia     iron deficiency  . Diverticulosis of colon 2004    Dr Arlyce Dice  . Hyperlipidemia   . Hypertension   . Vitamin d deficiency   . Vitamin B12 deficiency   . Hematuria     Dr Vonita Moss  . Depression 2009    grief  . Osteoarthritis, knee   . Cancer     breast, skin  . GERD (gastroesophageal reflux disease)     PAST SURGICAL HISTORY: Past Surgical History  Procedure Date  . Total knee arthroplasty 06-04-98  . Mastectomy 09-13-08    left and right  . L groin skin cancer 2011    FAMILY HISTORY Family History  Problem Relation Age of Onset  . Cancer Sister     breast  . Coronary artery disease Other   . Cancer Other     aunt, niece   the patient's father died at the age of 59 from complications of diabetes. The patient's mother died at the age of 25 from heart disease. The patient had 3 brothers and 4 sisters. One sister was diagnosed with breast cancer at the age of 54. There is no history of ovarian cancer in the family.  GYNECOLOGIC HISTORY: She is GX P5, with first live birth age 42. She had her hysterectomy in 1963. She never used hormone replacement.  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 She was widowed in 2009, and lives by herself. 4 of her 5 children live in town. She attends [employer] locally.   ADVANCED DIRECTIVES: Ngela does not have advanced directives in place, but in case of an emergency she requests we call her daughter Octavia Bruckner at [phone]  HEALTH MAINTENANCE: History  Substance Use Topics  . Smoking status: Never Smoker   . Smokeless tobacco: Not on file  . Alcohol Use: No     Colonoscopy: 2002  PAP: s/p hyst.  Bone density: May 2012/ nl  Lipid panel:  Allergies  Allergen Reactions  . Anastrozole     REACTION: arthralgias  . Aspirin   . Ciprofloxacin   . Codeine   . Letrozole     REACTION: leg pain    Current Outpatient Prescriptions  Medication Sig Dispense Refill  . acetaminophen (TYLENOL) 650 MG CR tablet Take 650 mg by mouth 2 (two) times daily.      Marland Kitchen amLODipine (NORVASC) 10 MG tablet Take 1 tablet (10 mg total) by mouth daily.  90 tablet  3  . amoxicillin-clavulanate (AUGMENTIN) 875-125 MG per tablet Take 1 tablet by mouth 2 (two) times daily.      . Cholecalciferol (EQL VITAMIN D3) 1000 UNITS tablet Take 1,000 Units by mouth daily.        Marland Kitchen  Cholecalciferol (VITAMIN D3) 50000 UNITS CAPS Take by mouth once a week. X 6 weeks       . cimetidine (TAGAMET) 200 MG tablet Take 1 tablet (200 mg total) by mouth 2 (two) times daily.  180 tablet  3  . clotrimazole-betamethasone (LOTRISONE) cream Apply topically 2 (two) times daily.  45 g  3  . dexlansoprazole (DEXILANT) 60 MG capsule Take 60 mg by mouth daily.        Marland Kitchen FLUoxetine (PROZAC) 10 MG capsule Take 10 mg by mouth daily.        Marland Kitchen HYDROcodone-acetaminophen (NORCO) 5-325 MG per tablet Take 2 tablets by mouth every 6 (six) hours as needed.  180 tablet  3  . ibuprofen (ADVIL,MOTRIN) 600 MG tablet Take 600 mg by mouth as needed.        Marland Kitchen KLOR-CON M20 20 MEQ tablet TAKE 1 TABLET UP TO 3 TIMES TODAY AS DIRECTED  90 tablet  2  .  loratadine (CLARITIN) 10 MG tablet Take 10 mg by mouth daily.        Marland Kitchen losartan (COZAAR) 100 MG tablet Take 1 tablet (100 mg total) by mouth daily.  90 tablet  3  . NON FORMULARY Prevpak- take as directed       . Probiotic Product (ALIGN) 4 MG CAPS Take 1 capsule by mouth daily.        . promethazine (PHENERGAN) 25 MG tablet TAKE 1 TABLET (25 MG TOTAL) BY MOUTH EVERY 8 (EIGHT) HOURS AS NEEDED FOR NAUSEA.  60 tablet  0  . promethazine (PHENERGAN) 25 MG tablet Take 25 mg by mouth every 6 (six) hours as needed.      . traMADol (ULTRAM) 50 MG tablet Take by mouth 2 (two) times daily as needed. Take 1-2 tabs bid       . triamcinolone (KENALOG) 0.5 % cream Apply topically 2 (two) times daily.        . vitamin B-12 (CYANOCOBALAMIN) 1000 MCG tablet Take by mouth daily.          OBJECTIVE: Elderly African American woman who walks with a cane Filed Vitals:   05/20/12 1117  BP: 123/67  Pulse: 52  Temp: 98 F (36.7 C)  Resp: 20     Body mass index is 35.53 kg/(m^2).    ECOG FS: 1  Sclerae unicteric Oropharynx clear No cervical or supraclavicular adenopathy Lungs no rales or rhonchi Heart regular rate and rhythm, 2/6 systolic murmur  Abd obese, benign MSK no focal spinal tenderness Neuro: nonfocal Breasts: Status post bilateral mastectomies. No evidence of local recurrence.  LAB RESULTS: Lab Results  Component Value Date   WBC 5.0 05/13/2012   NEUTROABS 2.0 05/13/2012   HGB 13.4 05/13/2012   HCT 40.2 05/13/2012   MCV 85.1 05/13/2012   PLT 274 05/13/2012      Chemistry      Component Value Date/Time   NA 141 05/13/2012 1021   NA 141 01/06/2012 0911   K 3.7 05/13/2012 1021   K 3.7 01/06/2012 0911   CL 102 05/13/2012 1021   CL 101 01/06/2012 0911   CO2 28 05/13/2012 1021   CO2 31 01/06/2012 0911   BUN 12.0 05/13/2012 1021   BUN 12 01/06/2012 0911   CREATININE 0.9 05/13/2012 1021   CREATININE 0.7 01/06/2012 0911      Component Value Date/Time   CALCIUM 9.8 05/13/2012 1021   CALCIUM 9.5 01/06/2012  0911   ALKPHOS 72 05/13/2012 1021   ALKPHOS 61  11/12/2011 0952   AST 15 05/13/2012 1021   AST 16 11/12/2011 0952   ALT 11 05/13/2012 1021   ALT 11 11/12/2011 0952   BILITOT 0.60 05/13/2012 1021   BILITOT 0.4 11/12/2011 0952       Lab Results  Component Value Date   LABCA2 29 05/13/2012    No components found with this basename: [id number]    No results found for this basename: INR:1;PROTIME:1 in the last 168 hours  Urinalysis    Component Value Date/Time   COLORURINE LT. YELLOW 04/16/2011 0922   APPEARANCEUR CLEAR 04/16/2011 0922   LABSPEC 1.015 04/16/2011 0922   PHURINE 6.5 04/16/2011 0922   HGBUR SMALL 04/16/2011 0922   BILIRUBINUR NEGATIVE 04/16/2011 0922   KETONESUR NEGATIVE 04/16/2011 0922   UROBILINOGEN 0.2 04/16/2011 0922   NITRITE NEGATIVE 04/16/2011 0922   LEUKOCYTESUR NEGATIVE 04/16/2011 0922    STUDIES: No results found.  ASSESSMENT: 78 y.o. [place] woman with a history of:   1. Left breast cancer, status post modified radical mastectomy July 1997 for a 2-cm medullary tumor, treated neoadjuvantly with paclitaxel and doxorubicin, mastectomy revealing 5 out of 6 nodes involved, post mastectomy treatment including CMF (for this triple-negative lesion), followed by radiation, completed January 1998.    2. Status post right mastectomy January 2010 for a T1c N0, stage IA invasive ductal carcinoma, grade 2, strongly estrogen and progesterone receptor positive, HER2 negative, with an MIB-1 of 22%.  She tolerated letrozole and anastrozole poorly and she had a remote history of left lower extremity deep venous thrombosis associated with trauma, so we did not try tamoxifen.  She was started on exemestane March 2012 and continues on that medicine with good tolerance.      PLAN: Desere is doing fine from a breast cancer point of view. We will continue to see her on a once a year basis, and she will continue exemestane on until March of 2017. Her next bone density will be due may of  2014. She knows to call for any problems that may develop before the next visit.   Annaka Cleaver C    05/20/2012

## 2012-07-28 ENCOUNTER — Telehealth: Payer: Self-pay | Admitting: Oncology

## 2012-07-28 NOTE — Telephone Encounter (Signed)
S/w the pt and she is aware that i will be mailing her the sept 2014 appt calendar.

## 2012-10-04 ENCOUNTER — Other Ambulatory Visit: Payer: Self-pay | Admitting: *Deleted

## 2012-10-05 ENCOUNTER — Other Ambulatory Visit: Payer: Self-pay | Admitting: *Deleted

## 2012-10-05 NOTE — Telephone Encounter (Signed)
Pt called to state she is having difficulty affording co-pay on exemustane - $45 a month.  This RN referred pt to [facility] in managed care and requested her to speak with our financial counselors for additional resources that could benefit her.

## 2012-10-19 ENCOUNTER — Encounter: Payer: Self-pay | Admitting: Internal Medicine

## 2012-10-19 ENCOUNTER — Ambulatory Visit (INDEPENDENT_AMBULATORY_CARE_PROVIDER_SITE_OTHER): Payer: Medicare Other | Admitting: Internal Medicine

## 2012-10-19 VITALS — BP 150/82 | HR 68 | Temp 98.4°F | Resp 16 | Wt 221.0 lb

## 2012-10-19 DIAGNOSIS — K5792 Diverticulitis of intestine, part unspecified, without perforation or abscess without bleeding: Secondary | ICD-10-CM

## 2012-10-19 DIAGNOSIS — R634 Abnormal weight loss: Secondary | ICD-10-CM

## 2012-10-19 DIAGNOSIS — K921 Melena: Secondary | ICD-10-CM

## 2012-10-19 DIAGNOSIS — I10 Essential (primary) hypertension: Secondary | ICD-10-CM

## 2012-10-19 DIAGNOSIS — K5732 Diverticulitis of large intestine without perforation or abscess without bleeding: Secondary | ICD-10-CM

## 2012-10-19 MED ORDER — METRONIDAZOLE 500 MG PO TABS: 500.0000 mg | ORAL_TABLET | Freq: Three times a day (TID) | ORAL | Status: DC

## 2012-10-19 MED ORDER — AMOXICILLIN-POT CLAVULANATE 875-125 MG PO TABS: 1.0000 | ORAL_TABLET | Freq: Two times a day (BID) | ORAL | Status: DC

## 2012-10-19 MED ORDER — TRIAMCINOLONE ACETONIDE 0.5 % EX CREA: TOPICAL_CREAM | Freq: Two times a day (BID) | CUTANEOUS | Status: DC

## 2012-10-19 MED ORDER — CLOTRIMAZOLE-BETAMETHASONE 1-0.05 % EX CREA: TOPICAL_CREAM | Freq: Two times a day (BID) | CUTANEOUS | Status: DC

## 2012-10-19 NOTE — Progress Notes (Signed)
Subjective:    Rectal Bleeding  The current episode started 2 days ago. The onset was sudden. The problem occurs occasionally. The pain is severe (rectal). The stool is described as mixed with blood (on a pad). Associated symptoms include abdominal pain. Pertinent negatives include no fever, no diarrhea, no nausea, no rectal pain, no vomiting, no hematuria, no vaginal bleeding, no vaginal discharge, no chest pain, no headaches, no coughing and no rash. She has been eating and drinking normally.  Abdominal Pain This is a new problem. The current episode started yesterday. The problem has been gradually improving. The pain is located in the LLQ and RLQ. The pain is moderate. The quality of the pain is dull and cramping. Associated symptoms include hematochezia. Pertinent negatives include no arthralgias, constipation, diarrhea, dysuria, fever, frequency, headaches, hematuria, nausea or vomiting. She has tried oral narcotic analgesics for the symptoms. The treatment provided significant relief. Prior diagnostic workup includes GI consult.  Emesis  Associated symptoms include abdominal pain and chills. Pertinent negatives include no arthralgias, chest pain, coughing, diarrhea, dizziness, fever or headaches.         C/o nausea w/meals  - new, occ vomiting. C/o LLQ abd pain x 3 days - worse. No diarrhea. C/o chills  Review of Systems  Constitutional: Positive for chills and fatigue. Negative for fever, diaphoresis, activity change, appetite change and unexpected weight change.  HENT: Negative for hearing loss, ear pain, nosebleeds, congestion, sore throat, facial swelling, rhinorrhea, sneezing, mouth sores, trouble swallowing, neck pain, neck stiffness, postnasal drip, sinus pressure and tinnitus.   Eyes: Negative for pain, discharge, redness, itching and visual disturbance.  Respiratory: Negative for cough, chest tightness, shortness of breath, wheezing and stridor.   Cardiovascular: Negative for  chest pain, palpitations and leg swelling.  Gastrointestinal: Positive for abdominal pain and hematochezia. Negative for nausea, vomiting, diarrhea, constipation, blood in stool, abdominal distention, anal bleeding and rectal pain.  Genitourinary: Negative for dysuria, urgency, frequency, hematuria, flank pain, vaginal bleeding, vaginal discharge, difficulty urinating, genital sores, vaginal pain and pelvic pain.  Musculoskeletal: Negative for back pain, joint swelling, arthralgias and gait problem.  Skin: Negative.  Negative for pallor and rash.  Neurological: Negative for dizziness, tremors, seizures, syncope, speech difficulty, weakness, numbness and headaches.  Hematological: Negative for adenopathy. Does not bruise/bleed easily.  Psychiatric/Behavioral: Negative for suicidal ideas, behavioral problems, confusion, sleep disturbance, dysphoric mood and decreased concentration. The patient is not nervous/anxious.    Wt Readings from Last 3 Encounters:  10/19/12 221 lb (100.245 kg)  05/20/12 220 lb 1.6 oz (99.837 kg)  01/06/12 207 lb 2 oz (93.951 kg)       Objective:   Physical Exam  Constitutional: She appears well-developed. No distress.  NAD  HENT:  Head: Normocephalic.  Right Ear: External ear normal.  Left Ear: External ear normal.  Nose: Nose normal.  Mouth/Throat: Oropharynx is clear and moist.  Eyes: Conjunctivae are normal. Pupils are equal, round, and reactive to light. Right eye exhibits no discharge. Left eye exhibits no discharge.  Neck: Normal range of motion. Neck supple. No JVD present. No tracheal deviation present. No thyromegaly present.  Cardiovascular: Normal rate, regular rhythm and normal heart sounds.   Pulmonary/Chest: No stridor. No respiratory distress. She has no wheezes.  Abdominal: Soft. Bowel sounds are normal. She exhibits no distension and no mass. There is tenderness (LLQ). There is no rebound and no guarding.  Musculoskeletal: She exhibits no edema  and no tenderness.  Lymphadenopathy:    She has  no cervical adenopathy.  Neurological: She displays normal reflexes. No cranial nerve deficit. She exhibits normal muscle tone. Coordination normal.  Skin: No rash noted. No erythema.  Psychiatric: She has a normal mood and affect. Her behavior is normal. Judgment and thought content normal.    Procedure: Anoscopy Indication: bleeding Risks and benefits were explained to pt in detail. He was placed in lateral decubitus position. Digital rectal exam was normal  . Stool was guaiac negative. . Anoscope was introduced without difficulty. No masses. Upon withdrawal normal mucosa was observed. There was a       at 9 o'clock. Impression: Tolerated well. Complications - none.       Assessment & Plan:

## 2012-10-19 NOTE — Patient Instructions (Addendum)
Low residue diet x 2 wks

## 2012-10-19 NOTE — Assessment & Plan Note (Signed)
Wt Readings from Last 3 Encounters:  10/19/12 221 lb (100.245 kg)  05/20/12 220 lb 1.6 oz (99.837 kg)  01/06/12 207 lb 2 oz (93.951 kg)

## 2012-10-19 NOTE — Assessment & Plan Note (Signed)
Diverticulitis related likely  Anoscopy - [facility]NL GI cons Dr Arlyce Dice

## 2012-10-19 NOTE — Assessment & Plan Note (Signed)
Augmentin/Flagyl Low residue diet

## 2012-10-19 NOTE — Assessment & Plan Note (Signed)
Continue with current prescription therapy as reflected on the Med list.  

## 2012-11-12 ENCOUNTER — Ambulatory Visit: Payer: Medicare Other | Admitting: Internal Medicine

## 2012-11-16 ENCOUNTER — Encounter: Payer: Self-pay | Admitting: Gastroenterology

## 2012-12-10 ENCOUNTER — Other Ambulatory Visit: Payer: Self-pay | Admitting: Internal Medicine

## 2013-02-04 ENCOUNTER — Other Ambulatory Visit (INDEPENDENT_AMBULATORY_CARE_PROVIDER_SITE_OTHER): Payer: Medicare Other

## 2013-02-04 ENCOUNTER — Ambulatory Visit (INDEPENDENT_AMBULATORY_CARE_PROVIDER_SITE_OTHER): Payer: Medicare Other | Admitting: Internal Medicine

## 2013-02-04 ENCOUNTER — Encounter: Payer: Self-pay | Admitting: Internal Medicine

## 2013-02-04 VITALS — BP 150/90 | HR 64 | Temp 98.2°F | Resp 16 | Ht 66.0 in | Wt 215.0 lb

## 2013-02-04 DIAGNOSIS — D1739 Benign lipomatous neoplasm of skin and subcutaneous tissue of other sites: Secondary | ICD-10-CM

## 2013-02-04 DIAGNOSIS — D509 Iron deficiency anemia, unspecified: Secondary | ICD-10-CM

## 2013-02-04 DIAGNOSIS — I10 Essential (primary) hypertension: Secondary | ICD-10-CM

## 2013-02-04 DIAGNOSIS — Z0000 Encounter for general adult medical examination without abnormal findings: Secondary | ICD-10-CM

## 2013-02-04 DIAGNOSIS — E538 Deficiency of other specified B group vitamins: Secondary | ICD-10-CM

## 2013-02-04 DIAGNOSIS — E559 Vitamin D deficiency, unspecified: Secondary | ICD-10-CM

## 2013-02-04 DIAGNOSIS — R2231 Localized swelling, mass and lump, right upper limb: Secondary | ICD-10-CM

## 2013-02-04 DIAGNOSIS — R229 Localized swelling, mass and lump, unspecified: Secondary | ICD-10-CM

## 2013-02-04 DIAGNOSIS — Z136 Encounter for screening for cardiovascular disorders: Secondary | ICD-10-CM

## 2013-02-04 DIAGNOSIS — D1720 Benign lipomatous neoplasm of skin and subcutaneous tissue of unspecified limb: Secondary | ICD-10-CM | POA: Insufficient documentation

## 2013-02-04 DIAGNOSIS — Z23 Encounter for immunization: Secondary | ICD-10-CM

## 2013-02-04 LAB — CBC WITH DIFFERENTIAL/PLATELET
Basophils Absolute: 0.1 10*3/uL (ref 0.0–0.1)
Basophils Relative: 0.9 % (ref 0.0–3.0)
Eosinophils Absolute: 0.1 10*3/uL (ref 0.0–0.7)
HCT: 40.3 % (ref 36.0–46.0)
MCHC: 32.6 g/dL (ref 30.0–36.0)
MCV: 86.9 fl (ref 78.0–100.0)
Monocytes Relative: 7.7 % (ref 3.0–12.0)
Neutro Abs: 1.9 10*3/uL (ref 1.4–7.7)
Platelets: 296 10*3/uL (ref 150.0–400.0)
WBC: 6.1 10*3/uL (ref 4.5–10.5)

## 2013-02-04 LAB — URINALYSIS, ROUTINE W REFLEX MICROSCOPIC
Bilirubin Urine: NEGATIVE
Ketones, ur: NEGATIVE
Leukocytes, UA: NEGATIVE
Urobilinogen, UA: 0.2 (ref 0.0–1.0)

## 2013-02-04 LAB — BASIC METABOLIC PANEL
BUN: 14 mg/dL (ref 6–23)
CO2: 31 mEq/L (ref 19–32)
Chloride: 102 mEq/L (ref 96–112)
Glucose, Bld: 93 mg/dL (ref 70–99)
Potassium: 4 mEq/L (ref 3.5–5.1)

## 2013-02-04 LAB — HEPATIC FUNCTION PANEL
AST: 14 U/L (ref 0–37)
Albumin: 3.9 g/dL (ref 3.5–5.2)
Alkaline Phosphatase: 60 U/L (ref 39–117)
Bilirubin, Direct: 0.1 mg/dL (ref 0.0–0.3)

## 2013-02-04 LAB — VITAMIN B12: Vitamin B-12: 287 pg/mL (ref 211–911)

## 2013-02-04 MED ORDER — PROMETHAZINE HCL 25 MG PO TABS: 12.5000 mg | ORAL_TABLET | Freq: Three times a day (TID) | ORAL | Status: DC | PRN

## 2013-02-04 MED ORDER — HYDROCODONE-ACETAMINOPHEN 5-325 MG PO TABS: 2.0000 | ORAL_TABLET | Freq: Four times a day (QID) | ORAL | Status: DC | PRN

## 2013-02-04 NOTE — Assessment & Plan Note (Signed)
Continue with current prescription therapy as reflected on the Med list.  

## 2013-02-04 NOTE — Assessment & Plan Note (Signed)
Labs On Rx 

## 2013-02-04 NOTE — Progress Notes (Signed)
- Subjective:    HPI  The patient is here for a wellness exam. The patient has been doing well overall without major physical or psychological issues going on lately. The patient presents for a follow-up of  chronic hypertension, chronic mild anemia,B12 def controlled with medicines  C/o R wrist pain off an on   Review of Systems  Constitutional: Positive for chills and fatigue. Negative for fever, diaphoresis, activity change, appetite change and unexpected weight change.  HENT: Negative for hearing loss, ear pain, nosebleeds, congestion, sore throat, facial swelling, rhinorrhea, sneezing, mouth sores, trouble swallowing, neck pain, neck stiffness, postnasal drip, sinus pressure and tinnitus.   Eyes: Negative for pain, discharge, redness, itching and visual disturbance.  Respiratory: Negative for cough, chest tightness, shortness of breath, wheezing and stridor.   Cardiovascular: Negative for chest pain, palpitations and leg swelling.  Gastrointestinal: Positive for nausea, vomiting and abdominal pain. Negative for diarrhea, constipation, blood in stool, abdominal distention, anal bleeding and rectal pain.  Genitourinary: Negative for dysuria, urgency, frequency, hematuria, flank pain, vaginal bleeding, vaginal discharge, difficulty urinating, genital sores, vaginal pain and pelvic pain.  Musculoskeletal: Negative for back pain, joint swelling, arthralgias and gait problem.  Skin: Negative.  Negative for pallor and rash.  Neurological: Negative for dizziness, tremors, seizures, syncope, speech difficulty, weakness, numbness and headaches.  Hematological: Negative for adenopathy. Does not bruise/bleed easily.  Psychiatric/Behavioral: Negative for suicidal ideas, behavioral problems, confusion, sleep disturbance, dysphoric mood and decreased concentration. The patient is not nervous/anxious.    Wt Readings from Last 3 Encounters:  02/04/13 215 lb (97.523 kg)  10/19/12 221 lb (100.245 kg)   05/20/12 220 lb 1.6 oz (99.837 kg)   BP Readings from Last 3 Encounters:  02/04/13 150/90  10/19/12 150/82  05/20/12 123/67        Objective:   Physical Exam  Constitutional: She appears well-developed. No distress.  NAD  HENT:  Head: Normocephalic.  Right Ear: External ear normal.  Left Ear: External ear normal.  Nose: Nose normal.  Mouth/Throat: Oropharynx is clear and moist.  Eyes: Conjunctivae are normal. Pupils are equal, round, and reactive to light. Right eye exhibits no discharge. Left eye exhibits no discharge.  Neck: Normal range of motion. Neck supple. No JVD present. No tracheal deviation present. No thyromegaly present.  Cardiovascular: Normal rate, regular rhythm and normal heart sounds.   Pulmonary/Chest: No stridor. No respiratory distress. She has no wheezes.  Abdominal: Soft. Bowel sounds are normal. She exhibits no distension and no mass. There is no tenderness. There is no rebound and no guarding.  Musculoskeletal: She exhibits tenderness. She exhibits no edema.  Swelling 4x3 cm over R dorsal distal forearm  Lymphadenopathy:    She has no cervical adenopathy.  Neurological: She displays normal reflexes. No cranial nerve deficit. She exhibits normal muscle tone. Coordination normal.  Skin: No rash noted. No erythema.  Psychiatric: She has a normal mood and affect. Her behavior is normal. Judgment and thought content normal.   Procedure Note :    Procedure :   Point of care (POC) sonography examination   Indication: R forearm mass   Equipment used: Sonosite M-Turbo with HFL38x/13-6 MHz transducer linear probe. The images were stored in the unit and later transferred in storage.  The patient was placed in a decubitus position.  This study revealed an isoechoic >4cm lesion in the R distal dorsal forearm.   Impression: An isoechoic >4cm lesion in the R distal dorsal forearm c/w lipoma.  Assessment & Plan:

## 2013-02-04 NOTE — Assessment & Plan Note (Signed)

## 2013-02-04 NOTE — Assessment & Plan Note (Signed)
Labs

## 2013-02-04 NOTE — Assessment & Plan Note (Signed)
POC [place]  FNA if needed

## 2013-02-07 ENCOUNTER — Encounter: Payer: Self-pay | Admitting: Internal Medicine

## 2013-02-07 ENCOUNTER — Ambulatory Visit (INDEPENDENT_AMBULATORY_CARE_PROVIDER_SITE_OTHER): Payer: Medicare Other | Admitting: Internal Medicine

## 2013-02-07 VITALS — BP 140/80 | HR 76 | Temp 99.3°F | Resp 16

## 2013-02-07 DIAGNOSIS — R229 Localized swelling, mass and lump, unspecified: Secondary | ICD-10-CM

## 2013-02-07 DIAGNOSIS — R2231 Localized swelling, mass and lump, right upper limb: Secondary | ICD-10-CM

## 2013-02-07 NOTE — Assessment & Plan Note (Signed)
Worse pain FNA

## 2013-02-07 NOTE — Patient Instructions (Signed)
    Instructions: Keep Band-Aid on x 24 hours. Use ice pack x 10-15 min every 2 hours while awake.  

## 2013-02-07 NOTE — Progress Notes (Signed)
Procedure: FNA    mass, US guided  Indication: diagnostic procedure  Procedure Note :   Equipment used: Sonosite M-Turbo with an HFL38x/13-6 MHz transducer linear probe. The images were stored in the unit and later transferred in storage.  Risks including unsuccessful procedure , bleeding, infection, bruising and others were explained to the patient in detail as well as the benefits. Informed consent was obtained and signed.   The patient was placed in a comfortable supine position.  Skin was prepped with Betadine and alcohol  and anesthetized with 1-2 cc of 2% lidocaine and epinephrine, using a 25-gauge 1-1/2 inch needle. Under a guidance of HFL38x/13-6 MHz transducer linear probe in a sterile cover and with a use of a sterile [place] gel, a 22 gauge 2" needle on a 10 cc syringe in a FNA gun was used to enter the lesion and to aspirate tissue in a usual fashion. We did two passages with 2 separate needles. Smears on slides were prepared (2 with fixative, 2 air dried).  Band-Aid was applied.  Tolerated well. Complications: None.   Instructions: Keep Band-Aid on x 24 hours. Use ice pack x 10-15 min every 2 hours while awake.

## 2013-02-08 ENCOUNTER — Other Ambulatory Visit (HOSPITAL_COMMUNITY)
Admission: RE | Admit: 2013-02-08 | Discharge: 2013-02-08 | Disposition: A | Discharge status: Home / Self Care | Payer: Medicare Other | Source: Ambulatory Visit | Attending: Internal Medicine | Admitting: Internal Medicine

## 2013-02-08 DIAGNOSIS — R229 Localized swelling, mass and lump, unspecified: Secondary | ICD-10-CM | POA: Insufficient documentation

## 2013-02-09 ENCOUNTER — Ambulatory Visit (INDEPENDENT_AMBULATORY_CARE_PROVIDER_SITE_OTHER)
Admission: RE | Admit: 2013-02-09 | Discharge: 2013-02-09 | Disposition: A | Discharge status: Home / Self Care | Payer: Medicare Other | Source: Ambulatory Visit | Attending: Internal Medicine | Admitting: Internal Medicine

## 2013-02-09 ENCOUNTER — Encounter: Payer: Self-pay | Admitting: Internal Medicine

## 2013-02-09 ENCOUNTER — Ambulatory Visit (INDEPENDENT_AMBULATORY_CARE_PROVIDER_SITE_OTHER): Payer: Medicare Other | Admitting: Internal Medicine

## 2013-02-09 VITALS — BP 130/74 | HR 72 | Temp 98.0°F | Resp 16

## 2013-02-09 DIAGNOSIS — Z23 Encounter for immunization: Secondary | ICD-10-CM

## 2013-02-09 DIAGNOSIS — M25529 Pain in unspecified elbow: Secondary | ICD-10-CM

## 2013-02-09 DIAGNOSIS — M25521 Pain in right elbow: Secondary | ICD-10-CM

## 2013-02-09 MED ORDER — ACYCLOVIR 800 MG PO TABS: 800.0000 mg | ORAL_TABLET | Freq: Every day | ORAL | Status: DC

## 2013-02-09 MED ORDER — PREDNISONE 10 MG PO TABS: ORAL_TABLET | ORAL | Status: DC

## 2013-02-09 NOTE — Progress Notes (Signed)
Subjective:    Arm Pain  The incident occurred 5 to 7 days ago. Pertinent negatives include no chest pain or numbness.  C/o severe R elbow pain - worse, R forearm and R hand pain  The patient is here for a wellness exam. The patient has been doing well overall without major physical or psychological issues going on lately. The patient presents for a follow-up of  chronic hypertension, chronic dyslipidemia, type 2 diabetes controlled with medicines     Review of Systems  Constitutional: Positive for chills and fatigue. Negative for fever, diaphoresis, activity change, appetite change and unexpected weight change.  HENT: Negative for hearing loss, ear pain, nosebleeds, congestion, sore throat, facial swelling, rhinorrhea, sneezing, mouth sores, trouble swallowing, neck pain, neck stiffness, postnasal drip, sinus pressure and tinnitus.   Eyes: Negative for pain, discharge, redness, itching and visual disturbance.  Respiratory: Negative for cough, chest tightness, shortness of breath, wheezing and stridor.   Cardiovascular: Negative for chest pain, palpitations and leg swelling.  Gastrointestinal: Positive for nausea, vomiting and abdominal pain. Negative for diarrhea, constipation, blood in stool, abdominal distention, anal bleeding and rectal pain.  Genitourinary: Negative for dysuria, urgency, frequency, hematuria, flank pain, vaginal bleeding, vaginal discharge, difficulty urinating, genital sores, vaginal pain and pelvic pain.  Musculoskeletal: Negative for back pain, joint swelling, arthralgias and gait problem.  Skin: Negative.  Negative for pallor and rash.  Neurological: Negative for dizziness, tremors, seizures, syncope, speech difficulty, weakness, numbness and headaches.  Hematological: Negative for adenopathy. Does not bruise/bleed easily.  Psychiatric/Behavioral: Negative for suicidal ideas, behavioral problems, confusion, sleep disturbance, dysphoric mood and decreased  concentration. The patient is not nervous/anxious.   no rash  Wt Readings from Last 3 Encounters:  02/04/13 215 lb (97.523 kg)  10/19/12 221 lb (100.245 kg)  05/20/12 220 lb 1.6 oz (99.837 kg)   BP Readings from Last 3 Encounters:  02/09/13 130/74  02/07/13 140/80  02/04/13 150/90        Objective:   Physical Exam  Constitutional: She appears well-developed. No distress.  NAD  HENT:  Head: Normocephalic.  Right Ear: External ear normal.  Left Ear: External ear normal.  Nose: Nose normal.  Mouth/Throat: Oropharynx is clear and moist.  Eyes: Conjunctivae are normal. Pupils are equal, round, and reactive to light. Right eye exhibits no discharge. Left eye exhibits no discharge.  Neck: Normal range of motion. Neck supple. No JVD present. No tracheal deviation present. No thyromegaly present.  Cardiovascular: Normal rate, regular rhythm and normal heart sounds.   Pulmonary/Chest: No stridor. No respiratory distress. She has no wheezes.  Abdominal: Soft. Bowel sounds are normal. She exhibits no distension and no mass. There is no tenderness. There is no rebound and no guarding.  Musculoskeletal: She exhibits tenderness. She exhibits no edema.  Swelling 4x3 cm over R dorsal distal forearm  Lymphadenopathy:    She has no cervical adenopathy.  Neurological: She displays normal reflexes. No cranial nerve deficit. She exhibits normal muscle tone. Coordination normal.  Skin: No rash noted. No erythema.  Psychiatric: She has a normal mood and affect. Her behavior is normal. Judgment and thought content normal.   Lab Results  Component Value Date   WBC 6.1 02/04/2013   HGB 13.1 02/04/2013   HCT 40.3 02/04/2013   PLT 296.0 02/04/2013   GLUCOSE 93 02/04/2013   ALT 14 02/04/2013   AST 14 02/04/2013   NA 139 02/04/2013   K 4.0 02/04/2013   CL 102 02/04/2013   CREATININE  0.8 02/04/2013   BUN 14 02/04/2013   CO2 31 02/04/2013   TSH 1.32 02/04/2013   Procedure: tennis elbow steroid injection  Indication: R  elbow lateral epicondylitis  Risks including bleeding, infection, unsuccessful procedure and others were explained to the patient in detail. The pt agreed to proceed. The patient was placed in the decubitus position. The area was prepped with betadine and alcohol. The injection was carried out with a mixture of 20 mg of Depomedrol and 2 cc of Lidocaine 2%. Band-Aid applied.  Tolerated well. Complications - none. Post-procedure instructions were provided.          Assessment & Plan:

## 2013-02-10 ENCOUNTER — Telehealth: Payer: Self-pay

## 2013-02-10 MED ORDER — PROMETHAZINE HCL 25 MG PO TABS: 12.5000 mg | ORAL_TABLET | Freq: Three times a day (TID) | ORAL | Status: DC | PRN

## 2013-02-10 NOTE — Telephone Encounter (Signed)
Pt called [facility] stating that MD prescribed RX for nausea that insurance will not cover. She would like an alternative. Thanks

## 2013-02-10 NOTE — Telephone Encounter (Signed)
I called pt and informed her this med should be on $4 list on [facility]. She request that I send promethazine to [facility] on [address]. Done

## 2013-02-12 ENCOUNTER — Encounter: Payer: Self-pay | Admitting: Internal Medicine

## 2013-02-12 DIAGNOSIS — M25521 Pain in right elbow: Secondary | ICD-10-CM | POA: Insufficient documentation

## 2013-02-12 NOTE — Assessment & Plan Note (Signed)
Will inject the elbow See other meds

## 2013-04-26 ENCOUNTER — Encounter: Payer: Self-pay | Admitting: Internal Medicine

## 2013-04-26 ENCOUNTER — Other Ambulatory Visit (INDEPENDENT_AMBULATORY_CARE_PROVIDER_SITE_OTHER): Payer: Medicare Other

## 2013-04-26 ENCOUNTER — Ambulatory Visit (INDEPENDENT_AMBULATORY_CARE_PROVIDER_SITE_OTHER): Payer: Medicare Other | Admitting: Internal Medicine

## 2013-04-26 VITALS — BP 140/80 | HR 74 | Temp 98.5°F | Wt 218.0 lb

## 2013-04-26 DIAGNOSIS — D509 Iron deficiency anemia, unspecified: Secondary | ICD-10-CM

## 2013-04-26 DIAGNOSIS — E785 Hyperlipidemia, unspecified: Secondary | ICD-10-CM

## 2013-04-26 DIAGNOSIS — E538 Deficiency of other specified B group vitamins: Secondary | ICD-10-CM

## 2013-04-26 DIAGNOSIS — E559 Vitamin D deficiency, unspecified: Secondary | ICD-10-CM

## 2013-04-26 DIAGNOSIS — F329 Major depressive disorder, single episode, unspecified: Secondary | ICD-10-CM

## 2013-04-26 DIAGNOSIS — I10 Essential (primary) hypertension: Secondary | ICD-10-CM

## 2013-04-26 LAB — HEPATIC FUNCTION PANEL
Albumin: 3.7 g/dL (ref 3.5–5.2)
Alkaline Phosphatase: 63 U/L (ref 39–117)

## 2013-04-26 LAB — BASIC METABOLIC PANEL
CO2: 31 mEq/L (ref 19–32)
Chloride: 103 mEq/L (ref 96–112)
Creatinine, Ser: 0.9 mg/dL (ref 0.4–1.2)
Potassium: 3.7 mEq/L (ref 3.5–5.1)
Sodium: 140 mEq/L (ref 135–145)

## 2013-04-26 MED ORDER — POTASSIUM CHLORIDE CRYS ER 20 MEQ PO TBCR: 20.0000 meq | EXTENDED_RELEASE_TABLET | Freq: Two times a day (BID) | ORAL | Status: DC

## 2013-04-26 NOTE — Assessment & Plan Note (Signed)
CBC

## 2013-04-26 NOTE — Assessment & Plan Note (Signed)
Continue with current prescription therapy as reflected on the Med list.  

## 2013-04-26 NOTE — Assessment & Plan Note (Signed)
Better - not on meds

## 2013-04-26 NOTE — Progress Notes (Signed)
-   Subjective:    HPI   The patient presents for a follow-up of  chronic hypertension, dyslipidemia, chronic mild anemia, B12 def   Review of Systems  Constitutional: Positive for chills and fatigue. Negative for fever, diaphoresis, activity change, appetite change and unexpected weight change.  HENT: Negative for hearing loss, ear pain, nosebleeds, congestion, sore throat, facial swelling, rhinorrhea, sneezing, mouth sores, trouble swallowing, neck pain, neck stiffness, postnasal drip, sinus pressure and tinnitus.   Eyes: Negative for pain, discharge, redness, itching and visual disturbance.  Respiratory: Negative for cough, chest tightness, shortness of breath, wheezing and stridor.   Cardiovascular: Negative for chest pain, palpitations and leg swelling.  Gastrointestinal: Positive for nausea, vomiting and abdominal pain. Negative for diarrhea, constipation, blood in stool, abdominal distention, anal bleeding and rectal pain.  Genitourinary: Negative for dysuria, urgency, frequency, hematuria, flank pain, vaginal bleeding, vaginal discharge, difficulty urinating, genital sores, vaginal pain and pelvic pain.  Musculoskeletal: Negative for back pain, joint swelling, arthralgias and gait problem.  Skin: Negative.  Negative for pallor and rash.  Neurological: Negative for dizziness, tremors, seizures, syncope, speech difficulty, weakness, numbness and headaches.  Hematological: Negative for adenopathy. Does not bruise/bleed easily.  Psychiatric/Behavioral: Negative for suicidal ideas, behavioral problems, confusion, sleep disturbance, dysphoric mood and decreased concentration. The patient is not nervous/anxious.    Wt Readings from Last 3 Encounters:  04/26/13 218 lb (98.884 kg)  02/04/13 215 lb (97.523 kg)  10/19/12 221 lb (100.245 kg)   BP Readings from Last 3 Encounters:  04/26/13 140/80  02/09/13 130/74  02/07/13 140/80        Objective:   Physical Exam  Constitutional: She  appears well-developed. No distress.  NAD  HENT:  Head: Normocephalic.  Right Ear: External ear normal.  Left Ear: External ear normal.  Nose: Nose normal.  Mouth/Throat: Oropharynx is clear and moist.  Eyes: Conjunctivae are normal. Pupils are equal, round, and reactive to light. Right eye exhibits no discharge. Left eye exhibits no discharge.  Neck: Normal range of motion. Neck supple. No JVD present. No tracheal deviation present. No thyromegaly present.  Cardiovascular: Normal rate, regular rhythm and normal heart sounds.   Pulmonary/Chest: No stridor. No respiratory distress. She has no wheezes.  Abdominal: Soft. Bowel sounds are normal. She exhibits no distension and no mass. There is no tenderness. There is no rebound and no guarding.  Musculoskeletal: She exhibits tenderness. She exhibits no edema.  Swelling 4x3 cm over R dorsal distal forearm  Lymphadenopathy:    She has no cervical adenopathy.  Neurological: She displays normal reflexes. No cranial nerve deficit. She exhibits normal muscle tone. Coordination normal.  Skin: No rash noted. No erythema.  Psychiatric: She has a normal mood and affect. Her behavior is normal. Judgment and thought content normal.            Assessment & Plan:

## 2013-04-26 NOTE — Assessment & Plan Note (Signed)
Labs

## 2013-05-12 ENCOUNTER — Other Ambulatory Visit (HOSPITAL_BASED_OUTPATIENT_CLINIC_OR_DEPARTMENT_OTHER): Payer: Medicare Other | Admitting: Lab

## 2013-05-12 DIAGNOSIS — C50919 Malignant neoplasm of unspecified site of unspecified female breast: Secondary | ICD-10-CM

## 2013-05-12 DIAGNOSIS — C50319 Malignant neoplasm of lower-inner quadrant of unspecified female breast: Secondary | ICD-10-CM

## 2013-05-12 LAB — COMPREHENSIVE METABOLIC PANEL (CC13)
ALT: 13 U/L (ref 0–55)
Albumin: 3.5 g/dL (ref 3.5–5.0)
CO2: 31 mEq/L — ABNORMAL HIGH (ref 22–29)
Calcium: 9.6 mg/dL (ref 8.4–10.4)
Chloride: 103 mEq/L (ref 98–109)
Glucose: 102 mg/dl (ref 70–140)
Potassium: 3.7 mEq/L (ref 3.5–5.1)
Sodium: 141 mEq/L (ref 136–145)
Total Protein: 7.4 g/dL (ref 6.4–8.3)

## 2013-05-12 LAB — CBC WITH DIFFERENTIAL/PLATELET
BASO%: 0.9 % (ref 0.0–2.0)
Eosinophils Absolute: 0.1 10*3/uL (ref 0.0–0.5)
MCHC: 33.4 g/dL (ref 31.5–36.0)
MONO#: 0.6 10*3/uL (ref 0.1–0.9)
NEUT#: 2 10*3/uL (ref 1.5–6.5)
RBC: 4.46 10*6/uL (ref 3.70–5.45)
RDW: 14.6 % — ABNORMAL HIGH (ref 11.2–14.5)
WBC: 6.4 10*3/uL (ref 3.9–10.3)

## 2013-05-19 ENCOUNTER — Ambulatory Visit: Payer: Medicare Other | Admitting: Oncology

## 2013-05-26 ENCOUNTER — Ambulatory Visit (HOSPITAL_BASED_OUTPATIENT_CLINIC_OR_DEPARTMENT_OTHER): Payer: Medicare Other | Admitting: Oncology

## 2013-05-26 VITALS — BP 146/77 | HR 74 | Temp 98.3°F | Resp 18 | Ht 66.0 in | Wt 218.4 lb

## 2013-05-26 DIAGNOSIS — C50919 Malignant neoplasm of unspecified site of unspecified female breast: Secondary | ICD-10-CM

## 2013-05-26 DIAGNOSIS — C50319 Malignant neoplasm of lower-inner quadrant of unspecified female breast: Secondary | ICD-10-CM

## 2013-05-26 DIAGNOSIS — Z170 Estrogen receptor positive status [ER+]: Secondary | ICD-10-CM

## 2013-05-26 DIAGNOSIS — Z853 Personal history of malignant neoplasm of breast: Secondary | ICD-10-CM

## 2013-05-26 DIAGNOSIS — Z86718 Personal history of other venous thrombosis and embolism: Secondary | ICD-10-CM

## 2013-05-26 DIAGNOSIS — C50911 Malignant neoplasm of unspecified site of right female breast: Secondary | ICD-10-CM

## 2013-05-26 MED ORDER — EXEMESTANE 25 MG PO TABS: 25.0000 mg | ORAL_TABLET | Freq: Every day | ORAL | Status: DC

## 2013-05-26 NOTE — Progress Notes (Signed)
ID: [name]   DOB: [birth year]  MR#: [id number]  CSN#:[id number]  PCP: [name] Corn MD GYN:  SU:  OTHER MD:   INTERVAL HISTORY: [name] [name] returns today  for followup of her breast cancer. The interval history is unremarkable. She is not exercising regularly, but sometimes "takes little walks around the house". She does use a cane.. She tells me family is doing "fine".   REVIEW OF SYSTEMS: She has some muscle aches and pains "here and there", which are not more intense or persistent than before. Possibly these could be made slightly worse by the exemestane, although she is not sure. She has some spots in her left axilla she wanted me to check. Her left on sometimes feels achy or swollen, although she doesn't feel there is actual lymphedema. She has stress urinary incontinence. Otherwise a detailed review of systems today was noncontributory  PAST MEDICAL HISTORY: Past Medical History  Diagnosis Date  . Anemia     iron deficiency  . Diverticulosis of colon 2004    Dr Arlyce Dice  . Hyperlipidemia   . Hypertension   . Vitamin D deficiency   . Vitamin B12 deficiency   . Hematuria     Dr Vonita Moss  . Depression 2009    grief  . Osteoarthritis, knee   . Cancer     breast, skin  . GERD (gastroesophageal reflux disease)     PAST SURGICAL HISTORY: Past Surgical History  Procedure Laterality Date  . Total knee arthroplasty  06-04-98  . Mastectomy  09-13-08    left and right  . L groin skin cancer  2011    FAMILY HISTORY Family History  Problem Relation Age of Onset  . Cancer Sister     breast  . Coronary artery disease Other   . Cancer Other     aunt, niece   the patient's father died at the age of 27 from complications of diabetes. The patient's mother died at the age of 4 from heart disease. The patient had 3 brothers and 4 sisters. One sister was diagnosed with breast cancer at the age of 52. There is no history of ovarian cancer in the family.  GYNECOLOGIC  HISTORY: She is GX P5, with first live birth age 89. She had her hysterectomy in 1963. She never used hormone replacement.  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 She was widowed in 2009, and lives by herself. 4 of her 5 children live in town. She attends [employer] locally.   ADVANCED DIRECTIVES: [name] [name] does not have advanced directives in place, but in case of an emergency she requests we call her daughter [name] [name] at [phone]  HEALTH MAINTENANCE: History  Substance Use Topics  . Smoking status: Never Smoker   . Smokeless tobacco: Not on file  . Alcohol Use: No     Colonoscopy: 2002  PAP: s/p hyst.  Bone density: May 2012/ nl  Lipid panel:  Allergies  Allergen Reactions  . Anastrozole     REACTION: arthralgias  . Aspirin   . Ciprofloxacin   . Codeine   . Letrozole     REACTION: leg pain    Current Outpatient Prescriptions  Medication Sig Dispense Refill  . acetaminophen (TYLENOL) 650 MG CR tablet Take 650 mg by mouth 2 (two) times daily.      Marland Kitchen acyclovir (ZOVIRAX) 800 MG tablet Take 1 tablet (800 mg total) by mouth 5 (five) times daily.  50 tablet  0  .  amLODipine (NORVASC) 10 MG tablet TAKE 1 TABLET BY MOUTH DAILY.  90 tablet  3  . Cholecalciferol (EQL VITAMIN D3) 1000 UNITS tablet Take 1,000 Units by mouth daily.        . Cholecalciferol (VITAMIN D3) 50000 UNITS CAPS Take by mouth once a week. X 6 weeks       . cimetidine (TAGAMET) 200 MG tablet Take 1 tablet (200 mg total) by mouth 2 (two) times daily.  180 tablet  3  . clotrimazole-betamethasone (LOTRISONE) cream Apply topically 2 (two) times daily.  45 g  3  . dexlansoprazole (DEXILANT) 60 MG capsule Take 60 mg by mouth daily.        Marland Kitchen exemestane (AROMASIN) 25 MG tablet Take 1 tablet (25 mg total) by mouth daily after breakfast.  30 tablet  12  . FLUoxetine (PROZAC) 10 MG capsule Take 10 mg by mouth daily.        Marland Kitchen HYDROcodone-acetaminophen (NORCO/VICODIN) 5-325 MG per tablet Take 2  tablets by mouth every 6 (six) hours as needed.  180 tablet  0  . ibuprofen (ADVIL,MOTRIN) 600 MG tablet Take 600 mg by mouth as needed.        . loratadine (CLARITIN) 10 MG tablet Take 10 mg by mouth daily.        Marland Kitchen losartan (COZAAR) 100 MG tablet TAKE 1 TABLET BY MOUTH DAILY.  90 tablet  3  . metroNIDAZOLE (FLAGYL) 500 MG tablet Take 1 tablet (500 mg total) by mouth 3 (three) times daily.  30 tablet  0  . NON FORMULARY Prevpak- take as directed       . potassium chloride SA (KLOR-CON M20) 20 MEQ tablet Take 1 tablet (20 mEq total) by mouth 2 (two) times daily.  60 tablet  11  . predniSONE (DELTASONE) 10 MG tablet Prednisone 10 mg: take 4 tabs a day x 3 days; then 3 tabs a day x 4 days; then 2 tabs a day x 4 days, then 1 tab a day x 6 days, then stop. Take pc.  38 tablet  1  . Probiotic Product (ALIGN) 4 MG CAPS Take 1 capsule by mouth daily.        . promethazine (PHENERGAN) 25 MG tablet Take 0.5 tablets (12.5 mg total) by mouth every 8 (eight) hours as needed for nausea.  60 tablet  0  . triamcinolone cream (KENALOG) 0.5 % Apply topically 2 (two) times daily.  30 g  3  . vitamin B-12 (CYANOCOBALAMIN) 1000 MCG tablet Take by mouth daily.         No current facility-administered medications for this visit.    OBJECTIVE: Elderly African American woman who appears stated age 79 Vitals:   05/26/13 1422  BP: 146/77  Pulse: 74  Temp: 98.3 F (36.8 C)  Resp: 18     Body mass index is 35.27 kg/(m^2).    ECOG FS: 1  Sclerae unicteric, pupils equal round and reactive to light Oropharynx shows no thrush or other lesions No cervical or supraclavicular adenopathy Lungs no rales or rhonchi Heart regular rate and rhythm, 2/6 systolic murmur, unchanged Abd obese, positive bowel sounds, no masses palpated MSK no focal spinal tenderness, no significant upper extremity lymphedema Neuro: nonfocal, well oriented, pleasant affect Breasts: Status post bilateral mastectomies. No evidence of chest wall  recurrence. I don't palpate any suspicious masses in the left armpit.. There are 2 small moles which appear benign. There is no axillary adenopathy on either side  LAB RESULTS:  Lab Results  Component Value Date   WBC 6.4 05/12/2013   NEUTROABS 2.0 05/12/2013   HGB 12.7 05/12/2013   HCT 38.0 05/12/2013   MCV 85.4 05/12/2013   PLT 314 05/12/2013      Chemistry      Component Value Date/Time   NA 141 05/12/2013 1321   NA 140 04/26/2013 0948   K 3.7 05/12/2013 1321   K 3.7 04/26/2013 0948   CL 103 04/26/2013 0948   CL 102 05/13/2012 1021   CO2 31* 05/12/2013 1321   CO2 31 04/26/2013 0948   BUN 12.8 05/12/2013 1321   BUN 10 04/26/2013 0948   CREATININE 1.0 05/12/2013 1321   CREATININE 0.9 04/26/2013 0948      Component Value Date/Time   CALCIUM 9.6 05/12/2013 1321   CALCIUM 9.6 04/26/2013 0948   ALKPHOS 64 05/12/2013 1321   ALKPHOS 63 04/26/2013 0948   AST 15 05/12/2013 1321   AST 17 04/26/2013 0948   ALT 13 05/12/2013 1321   ALT 16 04/26/2013 0948   BILITOT 0.35 05/12/2013 1321   BILITOT 0.5 04/26/2013 0948       Lab Results  Component Value Date   LABCA2 29 05/13/2012    No components found with this basename: [id number]    No results found for this basename: INR,  in the last 168 hours  Urinalysis    Component Value Date/Time   COLORURINE LT. YELLOW 02/04/2013 1110   APPEARANCEUR CLEAR 02/04/2013 1110   LABSPEC 1.020 02/04/2013 1110   PHURINE 6.0 02/04/2013 1110   HGBUR MODERATE 02/04/2013 1110   BILIRUBINUR NEGATIVE 02/04/2013 1110   KETONESUR NEGATIVE 02/04/2013 1110   UROBILINOGEN 0.2 02/04/2013 1110   NITRITE NEGATIVE 02/04/2013 1110   LEUKOCYTESUR NEGATIVE 02/04/2013 1110    STUDIES: No results found.  ASSESSMENT: 79 y.o. [place] woman with a history of:   1. Left breast cancer, status post modified radical mastectomy July 1997 for a 2-cm medullary tumor, treated neoadjuvantly with paclitaxel and doxorubicin, mastectomy revealing 5 out of 6 nodes involved, post mastectomy treatment  including CMF (for this triple-negative lesion), followed by radiation, completed January 1998.    2. Status post right mastectomy January 2010 for a T1c N0, stage IA invasive ductal carcinoma, grade 2, strongly estrogen and progesterone receptor positive, HER2 negative, with an MIB-1 of 22%.  She tolerated letrozole and anastrozole poorly and she had a remote history of left lower extremity deep venous thrombosis associated with trauma, so we did not try tamoxifen.  She was started on exemestane March 2012 and continues on that medicine with good tolerance.      PLAN: Anjelita shows no evidence of disease recurrence. The plan is to continue exemestane on until the spring of 2017. She will see [place] again in one year. She will have a bone density before that visit. She knows to call for any problems that may develop before then. Addalynne Golding C    05/26/2013

## 2013-05-27 ENCOUNTER — Other Ambulatory Visit: Payer: Self-pay | Admitting: *Deleted

## 2013-05-27 DIAGNOSIS — C50919 Malignant neoplasm of unspecified site of unspecified female breast: Secondary | ICD-10-CM

## 2013-05-27 MED ORDER — EXEMESTANE 25 MG PO TABS: 25.0000 mg | ORAL_TABLET | Freq: Every day | ORAL | Status: DC

## 2013-05-30 ENCOUNTER — Telehealth: Payer: Self-pay | Admitting: Oncology

## 2013-05-30 NOTE — Telephone Encounter (Signed)
, °

## 2013-05-30 NOTE — Addendum Note (Signed)
Addended by: Billey Co on: 05/30/2013 04:57 PM   Modules accepted: Orders

## 2013-06-15 ENCOUNTER — Encounter: Payer: Self-pay | Admitting: Gastroenterology

## 2013-08-26 ENCOUNTER — Encounter: Payer: Self-pay | Admitting: Internal Medicine

## 2013-08-26 ENCOUNTER — Ambulatory Visit (INDEPENDENT_AMBULATORY_CARE_PROVIDER_SITE_OTHER)
Admission: RE | Admit: 2013-08-26 | Discharge: 2013-08-26 | Disposition: A | Discharge status: Home / Self Care | Payer: Medicare Other | Source: Ambulatory Visit | Attending: Internal Medicine | Admitting: Internal Medicine

## 2013-08-26 ENCOUNTER — Ambulatory Visit (INDEPENDENT_AMBULATORY_CARE_PROVIDER_SITE_OTHER): Payer: Medicare Other | Admitting: Internal Medicine

## 2013-08-26 VITALS — BP 132/82 | HR 107 | Temp 99.1°F | Wt 211.4 lb

## 2013-08-26 DIAGNOSIS — R05 Cough: Secondary | ICD-10-CM | POA: Insufficient documentation

## 2013-08-26 DIAGNOSIS — I10 Essential (primary) hypertension: Secondary | ICD-10-CM

## 2013-08-26 DIAGNOSIS — R509 Fever, unspecified: Secondary | ICD-10-CM

## 2013-08-26 MED ORDER — LEVOFLOXACIN 250 MG PO TABS: 250.0000 mg | ORAL_TABLET | Freq: Every day | ORAL | Status: DC

## 2013-08-26 MED ORDER — HYDROCODONE-HOMATROPINE 5-1.5 MG/5ML PO SYRP: 5.0000 mL | ORAL_SOLUTION | Freq: Four times a day (QID) | ORAL | Status: DC | PRN

## 2013-08-26 NOTE — Assessment & Plan Note (Signed)
stable overall by history and exam, recent data reviewed with pt, and pt to continue medical treatment as before,  to f/u any worsening symptoms or concerns BP Readings from Last 3 Encounters:  08/26/13 132/82  05/26/13 146/77  04/26/13 140/80

## 2013-08-26 NOTE — Patient Instructions (Signed)
Please take all new medication as prescribed - the antibiotic, and cough medicine Please continue all other medications as before, and refills have been done if requested. Please have the pharmacy call with any other refills you may need.  Please go to the XRAY Department in the Basement (go straight as you get off the elevator) for the x-ray testing You will be contacted by phone if any changes need to be made immediately.  Otherwise, you will receive a letter about your results with an explanation, but please check with MyChart first.   

## 2013-08-26 NOTE — Assessment & Plan Note (Signed)
With prod cough  - c/w prob bronchitis or pna - for antibx, cough med, CXR - r/o pna,  to f/u any worsening symptoms or concerns

## 2013-08-26 NOTE — Progress Notes (Signed)
Subjective:    Patient ID: [name] [name], female    DOB: 1933-05-19, 79 y.o.   MRN: [id number]  HPI  Here with acute onset mild to mod 2-3 days ST, HA, general weakness and malaise, with prod cough greenish sputum, but Pt denies chest pain, increased sob or doe, wheezing, orthopnea, PND, increased LE swelling, palpitations, dizziness or syncope. Pt denies new neurological symptoms such as new headache, or facial or extremity weakness or numbness.  Pt denies polydipsia, polyuria. Past Medical History  Diagnosis Date  . Anemia     iron deficiency  . Diverticulosis of colon 2004    Dr Arlyce Dice  . Hyperlipidemia   . Hypertension   . Vitamin D deficiency   . Vitamin B12 deficiency   . Hematuria     Dr Vonita Moss  . Depression 2009    grief  . Osteoarthritis, knee   . Cancer     breast, skin  . GERD (gastroesophageal reflux disease)    Past Surgical History  Procedure Laterality Date  . Total knee arthroplasty  06-04-98  . Mastectomy  09-13-08    left and right  . L groin skin cancer  2011    reports that she has never smoked. She does not have any smokeless tobacco history on file. She reports that she does not drink alcohol or use illicit drugs. family history includes Cancer in her other and sister; Coronary artery disease in her other. Allergies  Allergen Reactions  . Anastrozole     REACTION: arthralgias  . Aspirin   . Ciprofloxacin   . Codeine   . Letrozole     REACTION: leg pain   Current Outpatient Prescriptions on File Prior to Visit  Medication Sig Dispense Refill  . acetaminophen (TYLENOL) 650 MG CR tablet Take 650 mg by mouth 2 (two) times daily.      Marland Kitchen acyclovir (ZOVIRAX) 800 MG tablet Take 1 tablet (800 mg total) by mouth 5 (five) times daily.  50 tablet  0  . amLODipine (NORVASC) 10 MG tablet TAKE 1 TABLET BY MOUTH DAILY.  90 tablet  3  . Cholecalciferol (EQL VITAMIN D3) 1000 UNITS tablet Take 1,000 Units by mouth daily.        . Cholecalciferol (VITAMIN D3)  50000 UNITS CAPS Take by mouth once a week. X 6 weeks       . clotrimazole-betamethasone (LOTRISONE) cream Apply topically 2 (two) times daily.  45 g  3  . dexlansoprazole (DEXILANT) 60 MG capsule Take 60 mg by mouth daily.        Marland Kitchen exemestane (AROMASIN) 25 MG tablet Take 1 tablet (25 mg total) by mouth daily after breakfast.  30 tablet  12  . HYDROcodone-acetaminophen (NORCO/VICODIN) 5-325 MG per tablet Take 2 tablets by mouth every 6 (six) hours as needed.  180 tablet  0  . loratadine (CLARITIN) 10 MG tablet Take 10 mg by mouth daily.        Marland Kitchen losartan (COZAAR) 100 MG tablet TAKE 1 TABLET BY MOUTH DAILY.  90 tablet  3  . metroNIDAZOLE (FLAGYL) 500 MG tablet Take 1 tablet (500 mg total) by mouth 3 (three) times daily.  30 tablet  0  . NON FORMULARY Prevpak- take as directed       . potassium chloride SA (KLOR-CON M20) 20 MEQ tablet Take 1 tablet (20 mEq total) by mouth 2 (two) times daily.  60 tablet  11  . predniSONE (DELTASONE) 10 MG tablet Prednisone 10 mg:  take 4 tabs a day x 3 days; then 3 tabs a day x 4 days; then 2 tabs a day x 4 days, then 1 tab a day x 6 days, then stop. Take pc.  38 tablet  1  . Probiotic Product (ALIGN) 4 MG CAPS Take 1 capsule by mouth daily.        . promethazine (PHENERGAN) 25 MG tablet Take 0.5 tablets (12.5 mg total) by mouth every 8 (eight) hours as needed for nausea.  60 tablet  0  . triamcinolone cream (KENALOG) 0.5 % Apply topically 2 (two) times daily.  30 g  3  . vitamin B-12 (CYANOCOBALAMIN) 1000 MCG tablet Take by mouth daily.         No current facility-administered medications on file prior to visit.   Review of Systems  Constitutional: Negative for unexpected weight change, or unusual diaphoresis  HENT: Negative for tinnitus.   Eyes: Negative for photophobia and visual disturbance.  Respiratory: Negative for choking and stridor.   Gastrointestinal: Negative for vomiting and blood in stool.  Genitourinary: Negative for hematuria and decreased urine  volume.  Musculoskeletal: Negative for acute joint swelling Skin: Negative for color change and wound.  Neurological: Negative for tremors and numbness other than noted  Psychiatric/Behavioral: Negative for decreased concentration or  hyperactivity.       Objective:   Physical Exam BP 132/82  Pulse 107  Temp(Src) 99.1 F (37.3 C) (Oral)  Wt 211 lb 6 oz (95.879 kg)  SpO2 98% VS noted, mild ill Constitutional: Pt appears well-developed and well-nourished.  HENT: Head: NCAT.  Right Ear: External ear normal.  Left Ear: External ear normal.  Eyes: Conjunctivae and EOM are normal. Pupils are equal, round, and reactive to light.  Neck: Normal range of motion. Neck supple.  Bilat tm's with mild erythema.  Max sinus areas non tender.  Pharynx with mild erythema, no exudate Cardiovascular: Normal rate and regular rhythm.   Pulmonary/Chest: Effort normal and breath sounds decr bilat, no overt wheezing or rales.  Neurological: Pt is alert. Not confused  Skin: Skin is warm. No erythema.  Psychiatric: Pt behavior is normal. Thought content normal.     Assessment & Plan:

## 2013-09-05 ENCOUNTER — Encounter: Payer: Self-pay | Admitting: Internal Medicine

## 2013-09-05 ENCOUNTER — Ambulatory Visit (INDEPENDENT_AMBULATORY_CARE_PROVIDER_SITE_OTHER): Payer: Medicare Other | Admitting: Internal Medicine

## 2013-09-05 VITALS — BP 120/80 | HR 80 | Temp 98.2°F | Resp 16 | Ht 66.0 in | Wt 212.0 lb

## 2013-09-05 DIAGNOSIS — J441 Chronic obstructive pulmonary disease with (acute) exacerbation: Secondary | ICD-10-CM

## 2013-09-05 MED ORDER — FLUTICASONE FUROATE-VILANTEROL 100-25 MCG/INH IN AEPB: 1.0000 | INHALATION_SPRAY | Freq: Every day | RESPIRATORY_TRACT | Status: DC

## 2013-09-05 NOTE — Progress Notes (Signed)
   Subjective:    Patient ID: [name] [name], female    DOB: [birth year], 80 y.o.   MRN: [id number]  Cough This is a recurrent problem. The current episode started 1 to 4 weeks ago. The problem has been gradually improving. The problem occurs every few hours. The cough is productive of sputum. Associated symptoms include wheezing. Pertinent negatives include no chest pain, chills, ear congestion, ear pain, fever, headaches, heartburn, hemoptysis, myalgias, nasal congestion, postnasal drip, rash, rhinorrhea, sore throat, shortness of breath, sweats or weight loss. She has tried oral steroids and prescription cough suppressant (levaquin) for the symptoms. The treatment provided moderate relief.      Review of Systems  Constitutional: Negative.  Negative for fever, chills, weight loss, diaphoresis, appetite change and fatigue.  HENT: Negative.  Negative for ear pain, postnasal drip, rhinorrhea, sinus pressure, sore throat, tinnitus, trouble swallowing and voice change.   Eyes: Negative.   Respiratory: Positive for cough and wheezing. Negative for apnea, hemoptysis, choking, chest tightness, shortness of breath and stridor.   Cardiovascular: Negative.  Negative for chest pain, palpitations and leg swelling.  Gastrointestinal: Negative.  Negative for heartburn, nausea, abdominal pain, diarrhea, constipation and blood in stool.  Endocrine: Negative.   Genitourinary: Negative.   Musculoskeletal: Negative.  Negative for myalgias.  Skin: Negative for rash.  Allergic/Immunologic: Negative.   Neurological: Negative.  Negative for headaches.  Hematological: Negative.  Negative for adenopathy. Does not bruise/bleed easily.  Psychiatric/Behavioral: Negative.        Objective:   Physical Exam  Vitals reviewed. Constitutional: She is oriented to person, place, and time. She appears well-developed and well-nourished.  Non-toxic appearance. She does not have a sickly appearance. She does not appear ill.  No distress.  HENT:  Head: Normocephalic and atraumatic.  Mouth/Throat: Oropharynx is clear and moist. No oropharyngeal exudate.  Eyes: Conjunctivae are normal. Right eye exhibits no discharge. Left eye exhibits no discharge. No scleral icterus.  Neck: Normal range of motion. Neck supple. No JVD present. No tracheal deviation present. No thyromegaly present.  Cardiovascular: Normal rate, regular rhythm, normal heart sounds and intact distal pulses.  Exam reveals no gallop.   No murmur heard. Pulmonary/Chest: Effort normal and breath sounds normal. No stridor. No respiratory distress. She has no wheezes. She has no rales. She exhibits no tenderness.  Abdominal: Soft. Bowel sounds are normal. She exhibits no distension and no mass. There is no tenderness. There is no rebound and no guarding.  Musculoskeletal: Normal range of motion. She exhibits no edema and no tenderness.  Lymphadenopathy:    She has no cervical adenopathy.  Neurological: She is oriented to person, place, and time.  Skin: Skin is warm and dry. No rash noted. She is not diaphoretic. No erythema. No pallor.  Psychiatric: She has a normal mood and affect. Her behavior is normal. Judgment and thought content normal.          Assessment & Plan:

## 2013-09-05 NOTE — Assessment & Plan Note (Signed)
She looks and sounds good today Her recent CXR was normal; She will cont with levaquin and the cough suppressant I think Breo may help her w her symptoms so I gave her a sample of it and I showed her how to use it, she showed proficiency with its use

## 2013-09-05 NOTE — Progress Notes (Signed)
Pre visit review using our clinic review tool, if applicable. No additional management support is needed unless otherwise documented below in the visit note. 

## 2013-09-05 NOTE — Patient Instructions (Signed)

## 2013-09-29 ENCOUNTER — Other Ambulatory Visit: Payer: Self-pay | Admitting: Internal Medicine

## 2013-10-03 ENCOUNTER — Telehealth: Payer: Self-pay | Admitting: Gastroenterology

## 2013-10-03 NOTE — Telephone Encounter (Signed)
Relevant patient education mailed to patient.  

## 2013-10-11 ENCOUNTER — Telehealth: Payer: Self-pay | Admitting: *Deleted

## 2013-10-11 NOTE — Telephone Encounter (Signed)
Patient phoned requesting refill for prednisone, per chart review, it was recently denied by [facility] r/t pt needing to schedule appt.  Phoned patient and informed her of MD response and reason for refill refusal.  Verbalized appreciation and understanding.

## 2013-10-24 ENCOUNTER — Ambulatory Visit: Payer: Medicare Other | Admitting: Internal Medicine

## 2013-11-02 ENCOUNTER — Ambulatory Visit (INDEPENDENT_AMBULATORY_CARE_PROVIDER_SITE_OTHER): Payer: Medicare Other | Admitting: Internal Medicine

## 2013-11-02 ENCOUNTER — Ambulatory Visit (INDEPENDENT_AMBULATORY_CARE_PROVIDER_SITE_OTHER)
Admission: RE | Admit: 2013-11-02 | Discharge: 2013-11-02 | Disposition: A | Discharge status: Home / Self Care | Payer: Medicare Other | Source: Ambulatory Visit | Attending: Internal Medicine | Admitting: Internal Medicine

## 2013-11-02 ENCOUNTER — Other Ambulatory Visit (INDEPENDENT_AMBULATORY_CARE_PROVIDER_SITE_OTHER): Payer: Medicare Other

## 2013-11-02 ENCOUNTER — Encounter: Payer: Self-pay | Admitting: Internal Medicine

## 2013-11-02 VITALS — BP 118/68 | HR 78 | Temp 99.7°F | Resp 16 | Ht 66.0 in | Wt 210.5 lb

## 2013-11-02 DIAGNOSIS — K5732 Diverticulitis of large intestine without perforation or abscess without bleeding: Secondary | ICD-10-CM | POA: Insufficient documentation

## 2013-11-02 DIAGNOSIS — E876 Hypokalemia: Secondary | ICD-10-CM

## 2013-11-02 DIAGNOSIS — R10814 Left lower quadrant abdominal tenderness: Secondary | ICD-10-CM

## 2013-11-02 DIAGNOSIS — R319 Hematuria, unspecified: Secondary | ICD-10-CM | POA: Insufficient documentation

## 2013-11-02 LAB — CBC WITH DIFFERENTIAL/PLATELET
Basophils Absolute: 0 10*3/uL (ref 0.0–0.1)
Basophils Relative: 0.1 % (ref 0.0–3.0)
EOS ABS: 0 10*3/uL (ref 0.0–0.7)
Eosinophils Relative: 0.1 % (ref 0.0–5.0)
HCT: 40.5 % (ref 36.0–46.0)
Hemoglobin: 13.4 g/dL (ref 12.0–15.0)
LYMPHS ABS: 1.7 10*3/uL (ref 0.7–4.0)
Lymphocytes Relative: 17.9 % (ref 12.0–46.0)
MCHC: 33.1 g/dL (ref 30.0–36.0)
MCV: 85.7 fl (ref 78.0–100.0)
Monocytes Absolute: 0.2 10*3/uL (ref 0.1–1.0)
Monocytes Relative: 2.1 % — ABNORMAL LOW (ref 3.0–12.0)
NEUTROS PCT: 79.8 % — AB (ref 43.0–77.0)
Neutro Abs: 7.7 10*3/uL (ref 1.4–7.7)
PLATELETS: 311 10*3/uL (ref 150.0–400.0)
RBC: 4.72 Mil/uL (ref 3.87–5.11)
RDW: 14.9 % — ABNORMAL HIGH (ref 11.5–14.6)
WBC: 9.6 10*3/uL (ref 4.5–10.5)

## 2013-11-02 LAB — URINALYSIS, ROUTINE W REFLEX MICROSCOPIC
BILIRUBIN URINE: NEGATIVE
KETONES UR: NEGATIVE
Leukocytes, UA: NEGATIVE
Nitrite: NEGATIVE
Specific Gravity, Urine: >=1.03 — AB (ref 1.000–1.030)
Total Protein, Urine: 100 — AB
URINE GLUCOSE: NEGATIVE
UROBILINOGEN UA: 0.2 (ref 0.0–1.0)
pH: 6 (ref 5.0–8.0)

## 2013-11-02 LAB — COMPREHENSIVE METABOLIC PANEL
ALBUMIN: 4.1 g/dL (ref 3.5–5.2)
ALK PHOS: 57 U/L (ref 39–117)
ALT: 15 U/L (ref 0–35)
AST: 18 U/L (ref 0–37)
BUN: 10 mg/dL (ref 6–23)
CALCIUM: 10 mg/dL (ref 8.4–10.5)
CHLORIDE: 98 meq/L (ref 96–112)
CO2: 30 mEq/L (ref 19–32)
Creatinine, Ser: 0.9 mg/dL (ref 0.4–1.2)
GFR: 83.02 mL/min (ref >60.00)
Glucose, Bld: 131 mg/dL — ABNORMAL HIGH (ref 70–99)
POTASSIUM: 3.2 meq/L — AB (ref 3.5–5.1)
SODIUM: 137 meq/L (ref 135–145)
TOTAL PROTEIN: 8.1 g/dL (ref 6.0–8.3)
Total Bilirubin: 0.9 mg/dL (ref 0.3–1.2)

## 2013-11-02 LAB — AMYLASE: AMYLASE: 56 U/L (ref 27–131)

## 2013-11-02 LAB — LIPASE: LIPASE: 21 U/L (ref 11.0–59.0)

## 2013-11-02 MED ORDER — METRONIDAZOLE 500 MG PO TABS: 500.0000 mg | ORAL_TABLET | Freq: Three times a day (TID) | ORAL | Status: DC

## 2013-11-02 MED ORDER — PROMETHAZINE HCL 25 MG PO TABS: 12.5000 mg | ORAL_TABLET | Freq: Three times a day (TID) | ORAL | Status: DC | PRN

## 2013-11-02 MED ORDER — POTASSIUM CHLORIDE CRYS ER 20 MEQ PO TBCR: 20.0000 meq | EXTENDED_RELEASE_TABLET | Freq: Two times a day (BID) | ORAL | Status: DC

## 2013-11-02 MED ORDER — SULFAMETHOXAZOLE-TRIMETHOPRIM 800-160 MG PO TABS
1.0000 | ORAL_TABLET | Freq: Two times a day (BID) | ORAL | Status: DC
Start: 2013-11-02 — End: 2014-05-16

## 2013-11-02 NOTE — Assessment & Plan Note (Signed)
UA shows hematuria and plain films show possible SBO - I have ordered a CT with contrast to gain more info about this Labs re otherwise normal though her WBC is on the high end of normal so will treat for diverticulitis with bactrim and flagyl

## 2013-11-02 NOTE — Progress Notes (Signed)
Pre visit review using our clinic review tool, if applicable. No additional management support is needed unless otherwise documented below in the visit note. 

## 2013-11-02 NOTE — Assessment & Plan Note (Signed)
She will restart K+ replacement therapy 

## 2013-11-02 NOTE — Patient Instructions (Signed)
Abdominal Pain, Adult °Many things can cause abdominal pain. Usually, abdominal pain is not caused by a disease and will improve without treatment. It can often be observed and treated at home. Your health care provider will do a physical exam and possibly order blood tests and X-rays to help determine the seriousness of your pain. However, in many cases, more time must pass before a clear cause of the pain can be found. Before that point, your health care provider may not know if you need more testing or further treatment. °HOME CARE INSTRUCTIONS  °Monitor your abdominal pain for any changes. The following actions may help to alleviate any discomfort you are experiencing: °· Only take over-the-counter or prescription medicines as directed by your health care provider. °· Do not take laxatives unless directed to do so by your health care provider. °· Try a clear liquid diet (broth, tea, or water) as directed by your health care provider. Slowly move to a bland diet as tolerated. °SEEK MEDICAL CARE IF: °· You have unexplained abdominal pain. °· You have abdominal pain associated with nausea or diarrhea. °· You have pain when you urinate or have a bowel movement. °· You experience abdominal pain that wakes you in the night. °· You have abdominal pain that is worsened or improved by eating food. °· You have abdominal pain that is worsened with eating fatty foods. °SEEK IMMEDIATE MEDICAL CARE IF:  °· Your pain does not go away within 2 hours. °· You have a fever. °· You keep throwing up (vomiting). °· Your pain is felt only in portions of the abdomen, such as the right side or the left lower portion of the abdomen. °· You pass bloody or black tarry stools. °MAKE SURE YOU: °· Understand these instructions.   °· Will watch your condition.   °· Will get help right away if you are not doing well or get worse.   °Document Released: 05/28/2005 Document Revised: 06/08/2013 Document Reviewed: 04/27/2013 °[employer]® Patient  Information ©2014 [employer]. ° °

## 2013-11-02 NOTE — Assessment & Plan Note (Signed)
Will check a CT scan to look for a stone

## 2013-11-02 NOTE — Assessment & Plan Note (Signed)
Will start flagyl and Bactrim-ds She will try phenergan for any N/V CT ordered to check for abscess

## 2013-11-02 NOTE — Progress Notes (Signed)
Subjective:    Patient ID: [name] [name], female    DOB: [birth year], 80 y.o.   MRN: [id number]  Abdominal Pain This is a new problem. The current episode started yesterday. The onset quality is sudden. The problem occurs intermittently. The problem has been unchanged. The pain is located in the LLQ. The pain is at a severity of 2/10. The pain is mild. The quality of the pain is aching. The abdominal pain does not radiate. Associated symptoms include anorexia, diarrhea, nausea and vomiting. Pertinent negatives include no arthralgias, belching, constipation, dysuria, fever, flatus, frequency, headaches, hematochezia, hematuria, melena, myalgias or weight loss. Nothing aggravates the pain. The pain is relieved by nothing. She has tried oral narcotic analgesics for the symptoms. The treatment provided mild relief.      Review of Systems  Constitutional: Negative.  Negative for fever, chills, weight loss, diaphoresis, appetite change and fatigue.  HENT: Negative.   Eyes: Negative.   Respiratory: Negative.  Negative for cough, choking, chest tightness, shortness of breath, wheezing and stridor.   Cardiovascular: Negative.  Negative for chest pain, palpitations and leg swelling.  Gastrointestinal: Positive for nausea, vomiting, abdominal pain, diarrhea and anorexia. Negative for constipation, blood in stool, melena, hematochezia, abdominal distention, anal bleeding, rectal pain and flatus.  Endocrine: Negative.   Genitourinary: Negative.  Negative for dysuria, urgency, frequency, hematuria, flank pain, vaginal discharge and difficulty urinating.  Musculoskeletal: Negative.  Negative for arthralgias and myalgias.  Skin: Negative.   Allergic/Immunologic: Negative.   Neurological: Negative.  Negative for dizziness and headaches.  Hematological: Negative.  Negative for adenopathy. Does not bruise/bleed easily.  Psychiatric/Behavioral: Negative.        Objective:   Physical Exam  Vitals  reviewed. Constitutional: She is oriented to person, place, and time. She appears well-developed and well-nourished.  Non-toxic appearance. She does not have a sickly appearance. She does not appear ill. No distress.  HENT:  Head: Normocephalic and atraumatic.  Mouth/Throat: Oropharynx is clear and moist. No oropharyngeal exudate.  Eyes: Conjunctivae are normal. Right eye exhibits no discharge. Left eye exhibits no discharge. No scleral icterus.  Neck: Normal range of motion. Neck supple. No JVD present. No tracheal deviation present. No thyromegaly present.  Cardiovascular: Normal rate, regular rhythm, normal heart sounds and intact distal pulses.  Exam reveals no gallop and no friction rub.   No murmur heard. Pulmonary/Chest: Effort normal and breath sounds normal. No stridor. No respiratory distress. She has no wheezes. She has no rales. She exhibits no tenderness.  Abdominal: Soft. Normal appearance and bowel sounds are normal. She exhibits no shifting dullness, no distension, no pulsatile liver, no fluid wave, no abdominal bruit, no ascites, no pulsatile midline mass and no mass. There is no hepatosplenomegaly, splenomegaly or hepatomegaly. There is tenderness in the left lower quadrant. There is no rigidity, no rebound, no guarding, no CVA tenderness, no tenderness at McBurney's point and negative Murphy's sign. No hernia. Hernia confirmed negative in the ventral area, confirmed negative in the right inguinal area and confirmed negative in the left inguinal area.  Musculoskeletal: Normal range of motion. She exhibits no edema and no tenderness.  Lymphadenopathy:    She has no cervical adenopathy.  Neurological: She is oriented to person, place, and time.  Skin: Skin is warm and dry. No rash noted. She is not diaphoretic. No erythema. No pallor.     Lab Results  Component Value Date   WBC 6.4 05/12/2013   HGB 12.7 05/12/2013   HCT 38.0  05/12/2013   PLT 314 05/12/2013   GLUCOSE 102 05/12/2013    ALT 13 05/12/2013   AST 15 05/12/2013   NA 141 05/12/2013   K 3.7 05/12/2013   CL 103 04/26/2013   CREATININE 1.0 05/12/2013   BUN 12.8 05/12/2013   CO2 31* 05/12/2013   TSH 1.32 02/04/2013       Assessment & Plan:

## 2013-11-04 ENCOUNTER — Inpatient Hospital Stay: Admission: RE | Admit: 2013-11-04 | Payer: Medicare Other | Source: Ambulatory Visit

## 2013-11-07 ENCOUNTER — Ambulatory Visit (INDEPENDENT_AMBULATORY_CARE_PROVIDER_SITE_OTHER)
Admission: RE | Admit: 2013-11-07 | Discharge: 2013-11-07 | Disposition: A | Discharge status: Home / Self Care | Payer: Medicare Other | Source: Ambulatory Visit | Attending: Internal Medicine | Admitting: Internal Medicine

## 2013-11-07 DIAGNOSIS — R319 Hematuria, unspecified: Secondary | ICD-10-CM

## 2013-11-07 DIAGNOSIS — R10814 Left lower quadrant abdominal tenderness: Secondary | ICD-10-CM

## 2013-11-07 DIAGNOSIS — K5732 Diverticulitis of large intestine without perforation or abscess without bleeding: Secondary | ICD-10-CM

## 2013-11-07 MED ORDER — IOHEXOL 300 MG/ML  SOLN
100.0000 mL | Freq: Once | INTRAMUSCULAR | Status: AC | PRN
  Administered 2013-11-07: 100 mL via INTRAVENOUS

## 2013-11-08 ENCOUNTER — Encounter: Payer: Self-pay | Admitting: Internal Medicine

## 2013-11-08 ENCOUNTER — Ambulatory Visit (INDEPENDENT_AMBULATORY_CARE_PROVIDER_SITE_OTHER): Payer: Medicare Other | Admitting: Internal Medicine

## 2013-11-08 VITALS — BP 116/72 | HR 88 | Temp 97.7°F | Resp 16 | Ht 66.0 in | Wt 211.1 lb

## 2013-11-08 DIAGNOSIS — R932 Abnormal findings on diagnostic imaging of liver and biliary tract: Secondary | ICD-10-CM | POA: Insufficient documentation

## 2013-11-08 NOTE — Progress Notes (Signed)
Pre visit review using our clinic review tool, if applicable. No additional management support is needed unless otherwise documented below in the visit note. 

## 2013-11-08 NOTE — Progress Notes (Signed)
   Subjective:    Patient ID: [name] [name], female    DOB: [birth year], 80 y.o.   MRN: [id number]  HPI Comments: She returns for f/up and she tells me that her abd pain has resolved. She feels well today with no complaints.     Review of Systems  Constitutional: Negative.  Negative for fever, chills, diaphoresis, appetite change and fatigue.  HENT: Negative.   Eyes: Negative.   Respiratory: Negative.  Negative for cough, choking, chest tightness, shortness of breath, wheezing and stridor.   Cardiovascular: Negative.  Negative for chest pain, palpitations and leg swelling.  Gastrointestinal: Negative.  Negative for nausea, vomiting, abdominal pain, diarrhea and constipation.  Endocrine: Negative.   Genitourinary: Negative.  Negative for dysuria, frequency, hematuria and difficulty urinating.  Musculoskeletal: Negative.   Allergic/Immunologic: Negative.   Neurological: Negative.   Hematological: Negative.  Negative for adenopathy. Does not bruise/bleed easily.  Psychiatric/Behavioral: Negative.        Objective:   Physical Exam  Vitals reviewed. Constitutional: She is oriented to person, place, and time. She appears well-developed and well-nourished. No distress.  HENT:  Head: Normocephalic and atraumatic.  Mouth/Throat: Oropharynx is clear and moist. No oropharyngeal exudate.  Eyes: Conjunctivae are normal. Right eye exhibits no discharge. Left eye exhibits no discharge. No scleral icterus.  Neck: Normal range of motion. Neck supple. No JVD present. No tracheal deviation present. No thyromegaly present.  Cardiovascular: Normal rate, regular rhythm, normal heart sounds and intact distal pulses.  Exam reveals no gallop and no friction rub.   No murmur heard. Pulmonary/Chest: Effort normal and breath sounds normal. No stridor. No respiratory distress. She has no wheezes. She has no rales. She exhibits no tenderness.  Abdominal: Soft. Bowel sounds are normal. She exhibits no  distension and no mass. There is no tenderness. There is no rebound and no guarding.  Musculoskeletal: Normal range of motion. She exhibits no edema and no tenderness.  Lymphadenopathy:    She has no cervical adenopathy.  Neurological: She is oriented to person, place, and time.  Skin: Skin is warm and dry. No rash noted. She is not diaphoretic. No erythema. No pallor.     Lab Results  Component Value Date   WBC 9.6 11/02/2013   HGB 13.4 11/02/2013   HCT 40.5 11/02/2013   PLT 311.0 11/02/2013   GLUCOSE 131* 11/02/2013   ALT 15 11/02/2013   AST 18 11/02/2013   NA 137 11/02/2013   K 3.2* 11/02/2013   CL 98 11/02/2013   CREATININE 0.9 11/02/2013   BUN 10 11/02/2013   CO2 30 11/02/2013   TSH 1.32 02/04/2013       Assessment & Plan:

## 2013-11-08 NOTE — Patient Instructions (Signed)
Abdominal Pain, Adult °Many things can cause abdominal pain. Usually, abdominal pain is not caused by a disease and will improve without treatment. It can often be observed and treated at home. Your health care provider will do a physical exam and possibly order blood tests and X-rays to help determine the seriousness of your pain. However, in many cases, more time must pass before a clear cause of the pain can be found. Before that point, your health care provider may not know if you need more testing or further treatment. °HOME CARE INSTRUCTIONS  °Monitor your abdominal pain for any changes. The following actions may help to alleviate any discomfort you are experiencing: °· Only take over-the-counter or prescription medicines as directed by your health care provider. °· Do not take laxatives unless directed to do so by your health care provider. °· Try a clear liquid diet (broth, tea, or water) as directed by your health care provider. Slowly move to a bland diet as tolerated. °SEEK MEDICAL CARE IF: °· You have unexplained abdominal pain. °· You have abdominal pain associated with nausea or diarrhea. °· You have pain when you urinate or have a bowel movement. °· You experience abdominal pain that wakes you in the night. °· You have abdominal pain that is worsened or improved by eating food. °· You have abdominal pain that is worsened with eating fatty foods. °SEEK IMMEDIATE MEDICAL CARE IF:  °· Your pain does not go away within 2 hours. °· You have a fever. °· You keep throwing up (vomiting). °· Your pain is felt only in portions of the abdomen, such as the right side or the left lower portion of the abdomen. °· You pass bloody or black tarry stools. °MAKE SURE YOU: °· Understand these instructions.   °· Will watch your condition.   °· Will get help right away if you are not doing well or get worse.   °Document Released: 05/28/2005 Document Revised: 06/08/2013 Document Reviewed: 04/27/2013 °[employer]® Patient  Information ©2014 [employer]. ° °

## 2013-11-08 NOTE — Assessment & Plan Note (Signed)
Her pain has resolved I have asked her to see general surgery to advise further about the gallbladder

## 2013-11-15 ENCOUNTER — Encounter (INDEPENDENT_AMBULATORY_CARE_PROVIDER_SITE_OTHER): Payer: Self-pay | Admitting: Surgery

## 2013-11-15 ENCOUNTER — Ambulatory Visit (INDEPENDENT_AMBULATORY_CARE_PROVIDER_SITE_OTHER): Payer: Medicare Other | Admitting: Surgery

## 2013-11-15 VITALS — BP 140/78 | HR 82 | Temp 98.4°F | Resp 16 | Ht 66.0 in | Wt 213.4 lb

## 2013-11-15 DIAGNOSIS — K811 Chronic cholecystitis: Secondary | ICD-10-CM | POA: Insufficient documentation

## 2013-11-15 NOTE — Progress Notes (Signed)
Patient ID: [name] [name], female   DOB: [birth year], 80 y.o.   MRN: [id number]  Chief Complaint  Patient presents with  . New Evaluation    eval abnormal GB xray    HPI [name] [name] is a 80 y.o. female.  Referred by Dr. Scarlette Calico for evaluation of gallbladder disease  HPI This is a 80 year old female with recent history of some left lower quadrant abdominal pain. She was empirically started on antibiotics by her primary care physician for presumed diverticulitis she subsequently had a CT scan. This showed no signs of active diverticulitis but there was some stranding and inflammation around the gallbladder in the right upper quadrant. She has had no symptoms in her upper abdomen. Upon further questioning, she has had a significant amount of postprandial nausea and bloating but no abdominal pain.  Recent liver function tests were normal.  Past Medical History  Diagnosis Date  . Anemia     iron deficiency  . Diverticulosis of colon 2004    Dr Deatra Ina  . Hyperlipidemia   . Hypertension   . Vitamin D deficiency   . Vitamin B12 deficiency   . Hematuria     Dr Terance Hart  . Depression 2009    grief  . Osteoarthritis, knee   . Cancer     breast, skin  . GERD (gastroesophageal reflux disease)     Past Surgical History  Procedure Laterality Date  . Total knee arthroplasty  06-04-98  . Mastectomy  09-13-08    left and right  . L groin skin cancer  2011    Family History  Problem Relation Age of Onset  . Cancer Sister     breast  . Coronary artery disease Other   . Cancer Other     aunt, niece  . Cancer Maternal Aunt     breast    Social History History  Substance Use Topics  . Smoking status: Never Smoker   . Smokeless tobacco: Not on file  . Alcohol Use: No    Allergies  Allergen Reactions  . Anastrozole     REACTION: arthralgias  . Aspirin   . Ciprofloxacin   . Codeine   . Letrozole     REACTION: leg pain    Current Outpatient Prescriptions  Medication  Sig Dispense Refill  . acetaminophen (TYLENOL) 650 MG CR tablet Take 650 mg by mouth 2 (two) times daily.      Marland Kitchen acyclovir (ZOVIRAX) 800 MG tablet Take 1 tablet (800 mg total) by mouth 5 (five) times daily.  50 tablet  0  . amLODipine (NORVASC) 10 MG tablet TAKE 1 TABLET BY MOUTH DAILY.  90 tablet  3  . Cholecalciferol (VITAMIN D3) 50000 UNITS CAPS Take by mouth once a week. X 6 weeks       . clotrimazole-betamethasone (LOTRISONE) cream Apply topically 2 (two) times daily.  45 g  3  . dexlansoprazole (DEXILANT) 60 MG capsule Take 60 mg by mouth daily.       Marland Kitchen exemestane (AROMASIN) 25 MG tablet Take 1 tablet (25 mg total) by mouth daily after breakfast.  30 tablet  12  . Fluticasone Furoate-Vilanterol (BREO ELLIPTA) 100-25 MCG/INH AEPB Inhale 1 puff into the lungs daily.  1 each  0  . HYDROcodone-acetaminophen (NORCO/VICODIN) 5-325 MG per tablet Take 2 tablets by mouth every 6 (six) hours as needed.  180 tablet  0  . loratadine (CLARITIN) 10 MG tablet Take 10 mg by mouth daily.        Marland Kitchen  losartan (COZAAR) 100 MG tablet TAKE 1 TABLET BY MOUTH DAILY.  90 tablet  3  . NON FORMULARY Prevpak- take as directed       . potassium chloride SA (KLOR-CON M20) 20 MEQ tablet Take 1 tablet (20 mEq total) by mouth 2 (two) times daily.  60 tablet  11  . promethazine (PHENERGAN) 25 MG tablet Take 0.5 tablets (12.5 mg total) by mouth every 8 (eight) hours as needed for nausea.  60 tablet  0  . triamcinolone cream (KENALOG) 0.5 % Apply topically 2 (two) times daily.  30 g  3  . vitamin B-12 (CYANOCOBALAMIN) 1000 MCG tablet Take by mouth daily.        . metroNIDAZOLE (FLAGYL) 500 MG tablet Take 1 tablet (500 mg total) by mouth 3 (three) times daily.  21 tablet  0  . Probiotic Product (ALIGN) 4 MG CAPS Take 1 capsule by mouth daily.        Marland Kitchen sulfamethoxazole-trimethoprim (SEPTRA DS) 800-160 MG per tablet Take 1 tablet by mouth 2 (two) times daily.  14 tablet  0   No current facility-administered medications for this  visit.    Review of Systems Review of Systems  Constitutional: Negative for fever, chills and unexpected weight change.  HENT: Negative for congestion, hearing loss, sore throat, trouble swallowing and voice change.   Eyes: Negative for visual disturbance.  Respiratory: Negative for cough and wheezing.   Cardiovascular: Negative for chest pain, palpitations and leg swelling.  Gastrointestinal: Positive for nausea and abdominal distention. Negative for vomiting, abdominal pain, diarrhea, constipation, blood in stool and anal bleeding.  Genitourinary: Negative for hematuria, vaginal bleeding and difficulty urinating.  Musculoskeletal: Negative for arthralgias.  Skin: Negative for rash and wound.  Neurological: Negative for seizures, syncope and headaches.  Hematological: Negative for adenopathy. Does not bruise/bleed easily.  Psychiatric/Behavioral: Negative for confusion.    Blood pressure 140/78, pulse 82, temperature 98.4 F (36.9 C), temperature source Temporal, resp. rate 16, height 5\' 6"  (1.676 m), weight 213 lb 6.4 oz (96.798 kg).  Physical Exam Physical Exam WDWN in NAD HEENT:  EOMI, sclera anicteric Neck:  No masses, no thyromegaly Lungs:  CTA bilaterally; normal respiratory effort CV:  Regular rate and rhythm; no murmurs Abd:  +bowel sounds, soft, non-tender, no masses Ext:  Well-perfused; no edema Skin:  Warm, dry; no sign of jaundice  Data Reviewed Ct Abdomen Pelvis W Contrast  11/07/2013   CLINICAL DATA:  Left lower quadrant pain for 6 days. Dark stools for the past day or so. Nausea, vomiting at onset. Evaluate for diverticulitis.  EXAM: CT ABDOMEN AND PELVIS WITH CONTRAST  TECHNIQUE: Multidetector CT imaging of the abdomen and pelvis was performed using the standard protocol following bolus administration of intravenous contrast.  CONTRAST:  152mL OMNIPAQUE IOHEXOL 300 MG/ML  SOLN  COMPARISON:  CT of the abdomen and pelvis on 06/17/2010  FINDINGS: The lung bases are  unremarkable in appearance.  No focal abnormality identified within the liver, spleen, or pancreas. Small bilateral adrenal nodules are identified, unchanged since prior studies and consistent with benign process. Small nodule in the right adrenal gland is 1.2 cm. In the left adrenal, nodule is 1.5 cm.  The gallbladder is distended shows mild wall thickening and pericholecystic stranding, suggestive of mild inflammation. Consider further evaluation with ultrasound.  The stomach and small bowel loops have a normal appearance. There are numerous colonic diverticula. However, no CT evidence for acute diverticulitis. The appendix is not well seen. However, there  is no inflammatory change in the right lower quadrant. There is no free intraperitoneal air or abscess.  No ascites. The uterus is absent. No adnexal mass. Lower abdominal scar is identified.  Degenerative changes are seen in the lower thoracic and lumbar spine. No suspicious lytic or blastic lesions are identified.  IMPRESSION: 1. Stable bilateral adrenal nodules, consistent with benign process. 2. Mild gallbladder wall thickening and gallbladder wall distention warrant further evaluation with ultrasound to evaluate for possible stones or cholecystitis. 3. Diverticulosis without evidence for acute diverticulitis.   Electronically Signed   By: Shon Hale M.D.   On: 11/07/2013 15:31   Dg Abd Acute W/chest  11/02/2013   CLINICAL DATA:  Abdominal pain  EXAM: ACUTE ABDOMEN SERIES (ABDOMEN 2 VIEW & CHEST 1 VIEW)  COMPARISON:  08/26/2013  FINDINGS: Heart size is normal. No pleural effusion or edema identified. No airspace consolidation identified. Surgical clips are identified within the left axilla. There are a few prominent loops of small bowel which measure up to 3 cm. Gas and stool noted within the colon and rectum. No significant air-fluid levels noted.  IMPRESSION: Nonspecific bowel gas pattern. A few dilated loops of small bowel are noted which may represent  focal ileus or partial obstruction.  No active cardiopulmonary abnormalities.   Electronically Signed   By: Kerby Moors M.D.   On: 11/02/2013 15:49    Lab Results  Component Value Date   WBC 9.6 11/02/2013   HGB 13.4 11/02/2013   HCT 40.5 11/02/2013   MCV 85.7 11/02/2013   PLT 311.0 11/02/2013   Lab Results  Component Value Date   LIPASE 21.0 11/02/2013   Lab Results  Component Value Date   CREATININE 0.9 11/02/2013   BUN 10 11/02/2013   NA 137 11/02/2013   K 3.2* 11/02/2013   CL 98 11/02/2013   CO2 30 11/02/2013   Lab Results  Component Value Date   ALT 15 11/02/2013   AST 18 11/02/2013   ALKPHOS 57 11/02/2013   BILITOT 0.9 11/02/2013     Assessment    Chronic cholecystitis - now asymptomatic     Plan    Recommend elective laparoscopic cholecystectomy with intraoperative cholangiogram.  There is no urgency as the patient is relatively asymptomatic.  The surgical procedure has been discussed with the patient.  Potential risks, benefits, alternative treatments, and expected outcomes have been explained.  All of the patient's questions at this time have been answered.  The likelihood of reaching the patient's treatment goal is good.  The patient understand the proposed surgical procedure and will call back if she is ready to schedule surgery.           Tariya Morrissette K. 11/15/2013, 12:46 PM

## 2013-11-15 NOTE — Patient Instructions (Signed)
Call us at [phone] if you decide that you want to have your gallbladder removed.

## 2013-12-14 ENCOUNTER — Other Ambulatory Visit: Payer: Self-pay | Admitting: Internal Medicine

## 2014-02-28 ENCOUNTER — Ambulatory Visit: Payer: Medicare Other | Admitting: Internal Medicine

## 2014-03-17 ENCOUNTER — Encounter: Payer: Self-pay | Admitting: Internal Medicine

## 2014-03-17 ENCOUNTER — Ambulatory Visit (INDEPENDENT_AMBULATORY_CARE_PROVIDER_SITE_OTHER): Payer: Medicare Other | Admitting: Internal Medicine

## 2014-03-17 VITALS — BP 130/80 | HR 76 | Temp 98.2°F | Resp 16 | Wt 207.0 lb

## 2014-03-17 DIAGNOSIS — M1990 Unspecified osteoarthritis, unspecified site: Secondary | ICD-10-CM

## 2014-03-17 DIAGNOSIS — E538 Deficiency of other specified B group vitamins: Secondary | ICD-10-CM

## 2014-03-17 DIAGNOSIS — I10 Essential (primary) hypertension: Secondary | ICD-10-CM

## 2014-03-17 DIAGNOSIS — D0590 Unspecified type of carcinoma in situ of unspecified breast: Secondary | ICD-10-CM

## 2014-03-17 DIAGNOSIS — C50919 Malignant neoplasm of unspecified site of unspecified female breast: Secondary | ICD-10-CM

## 2014-03-17 MED ORDER — TRIAMCINOLONE ACETONIDE 0.5 % EX CREA: TOPICAL_CREAM | Freq: Two times a day (BID) | CUTANEOUS | Status: DC

## 2014-03-17 MED ORDER — TRAMADOL HCL 50 MG PO TABS: 50.0000 mg | ORAL_TABLET | Freq: Two times a day (BID) | ORAL | Status: DC | PRN

## 2014-03-17 MED ORDER — CLOTRIMAZOLE-BETAMETHASONE 1-0.05 % EX CREA: TOPICAL_CREAM | Freq: Two times a day (BID) | CUTANEOUS | Status: DC

## 2014-03-17 MED ORDER — PREDNISONE 10 MG PO TABS: ORAL_TABLET | ORAL | Status: DC

## 2014-03-17 NOTE — Assessment & Plan Note (Signed)
Continue with current prescription therapy as reflected on the Med list.  

## 2014-03-17 NOTE — Progress Notes (Signed)
-   Subjective:    HPI   The patient presents for a follow-up of  chronic hypertension, dyslipidemia, chronic mild anemia, B12 def, OA - worse...   Review of Systems  Constitutional: Positive for fatigue. Negative for fever, chills, diaphoresis, activity change, appetite change and unexpected weight change.  HENT: Negative for congestion, ear pain, facial swelling, hearing loss, mouth sores, nosebleeds, postnasal drip, rhinorrhea, sinus pressure, sneezing, sore throat, tinnitus and trouble swallowing.   Eyes: Negative for pain, discharge, redness, itching and visual disturbance.  Respiratory: Negative for cough, chest tightness, shortness of breath, wheezing and stridor.   Cardiovascular: Negative for chest pain, palpitations and leg swelling.  Gastrointestinal: Negative for nausea, vomiting, abdominal pain, diarrhea, constipation, blood in stool, abdominal distention, anal bleeding and rectal pain.  Genitourinary: Negative for dysuria, urgency, frequency, hematuria, flank pain, vaginal bleeding, vaginal discharge, difficulty urinating, genital sores, vaginal pain and pelvic pain.  Musculoskeletal: Positive for arthralgias, back pain and gait problem. Negative for joint swelling, neck pain and neck stiffness.  Skin: Negative.  Negative for pallor and rash.  Neurological: Negative for dizziness, tremors, seizures, syncope, speech difficulty, weakness, numbness and headaches.  Hematological: Negative for adenopathy. Does not bruise/bleed easily.  Psychiatric/Behavioral: Negative for suicidal ideas, behavioral problems, confusion, sleep disturbance, dysphoric mood and decreased concentration. The patient is not nervous/anxious.    Wt Readings from Last 3 Encounters:  03/17/14 207 lb (93.895 kg)  11/15/13 213 lb 6.4 oz (96.798 kg)  11/08/13 211 lb 1.9 oz (95.763 kg)   BP Readings from Last 3 Encounters:  03/17/14 130/80  11/15/13 140/78  11/08/13 116/72        Objective:   Physical  Exam  Constitutional: She appears well-developed. No distress.  NAD  HENT:  Head: Normocephalic.  Right Ear: External ear normal.  Left Ear: External ear normal.  Nose: Nose normal.  Mouth/Throat: Oropharynx is clear and moist.  Eyes: Conjunctivae are normal. Pupils are equal, round, and reactive to light. Right eye exhibits no discharge. Left eye exhibits no discharge.  Neck: Normal range of motion. Neck supple. No JVD present. No tracheal deviation present. No thyromegaly present.  Cardiovascular: Normal rate, regular rhythm and normal heart sounds.   Pulmonary/Chest: No stridor. No respiratory distress. She has no wheezes.  Abdominal: Soft. Bowel sounds are normal. She exhibits no distension and no mass. There is no tenderness. There is no rebound and no guarding.  Musculoskeletal: She exhibits tenderness. She exhibits no edema.  Lymphadenopathy:    She has no cervical adenopathy.  Neurological: She displays normal reflexes. No cranial nerve deficit. She exhibits normal muscle tone. Coordination abnormal.  Cane  Skin: No rash noted. No erythema.  Psychiatric: She has a normal mood and affect. Her behavior is normal. Judgment and thought content normal.   Lab Results  Component Value Date   WBC 9.6 11/02/2013   HGB 13.4 11/02/2013   HCT 40.5 11/02/2013   PLT 311.0 11/02/2013   GLUCOSE 131* 11/02/2013   ALT 15 11/02/2013   AST 18 11/02/2013   NA 137 11/02/2013   K 3.2* 11/02/2013   CL 98 11/02/2013   CREATININE 0.9 11/02/2013   BUN 10 11/02/2013   CO2 30 11/02/2013   TSH 1.32 02/04/2013          Assessment & Plan:

## 2014-03-17 NOTE — Assessment & Plan Note (Addendum)
Worse in legs now. Prednisone 10 mg: take 4 tabs a day x 3 days; then 3 tabs a day x 4 days; then 2 tabs a day x 4 days, then 1 tab a day x 6 days, then stop. Take pc.   Potential benefits of a short/long term steroid  use as well as potential risks  and complications were explained to the patient and were aknowledged. Tramadol prn  Potential benefits of a long term opioids use as well as potential risks (i.e. addiction risk, apnea etc) and complications (i.e. Somnolence, constipation and others) were explained to the patient and were aknowledged.

## 2014-03-17 NOTE — Assessment & Plan Note (Signed)
F/u w/Dr [place] L 2009

## 2014-03-17 NOTE — Assessment & Plan Note (Signed)
Doing good  

## 2014-03-17 NOTE — Progress Notes (Signed)
Pre visit review using our clinic review tool, if applicable. No additional management support is needed unless otherwise documented below in the visit note. 

## 2014-03-18 ENCOUNTER — Telehealth: Payer: Self-pay | Admitting: Internal Medicine

## 2014-03-18 NOTE — Telephone Encounter (Signed)
Relevant patient education mailed to patient.  

## 2014-05-03 ENCOUNTER — Other Ambulatory Visit (HOSPITAL_COMMUNITY): Payer: Self-pay | Admitting: Orthopedic Surgery

## 2014-05-12 ENCOUNTER — Telehealth: Payer: Self-pay | Admitting: Nurse Practitioner

## 2014-05-12 NOTE — Telephone Encounter (Signed)
returned call to pt from [facility]-pt wanted to CX appt due to knee surgery. Pt will call and r/s once surgery over

## 2014-05-16 ENCOUNTER — Encounter (HOSPITAL_COMMUNITY): Payer: Self-pay | Admitting: Pharmacy Technician

## 2014-05-17 NOTE — Pre-Procedure Instructions (Signed)
[name] [name]  05/17/2014   Your procedure is scheduled on:  05/24/14  Report to [facility] Admitting at 630 AM.  Call this number if you have problems the morning of surgery: [phone]   Remember:   Do not eat food or drink liquids after midnight.   Take these medicines the morning of surgery with A SIP OF WATER: amlodipine,tylenol   Do not wear jewelry, make-up or nail polish.  Do not wear lotions, powders, or perfumes. You may wear deodorant.  Do not shave 48 hours prior to surgery. Men may shave face and neck.  Do not bring valuables to the hospital.  [facility] is not responsible                  for any belongings or valuables.               Contacts, dentures or bridgework may not be worn into surgery.  Leave suitcase in the car. After surgery it may be brought to your room.  For patients admitted to the hospital, discharge time is determined by your                treatment team.               Patients discharged the day of surgery will not be allowed to drive  home.  Name and phone number of your driver: family  Special Instructions: Incentive Spirometry - Practice and bring it with you on the day of surgery.   Please read over the following fact sheets that you were given: Pain Booklet, Coughing and Deep Breathing, Blood Transfusion Information, MRSA Information and Surgical Site Infection Prevention

## 2014-05-18 ENCOUNTER — Encounter (HOSPITAL_COMMUNITY)
Admission: RE | Admit: 2014-05-18 | Discharge: 2014-05-18 | Disposition: A | Discharge status: Home / Self Care | Payer: Medicare Other | Source: Ambulatory Visit | Attending: Orthopedic Surgery | Admitting: Orthopedic Surgery

## 2014-05-18 ENCOUNTER — Telehealth: Payer: Self-pay | Admitting: *Deleted

## 2014-05-18 ENCOUNTER — Encounter (HOSPITAL_COMMUNITY): Payer: Self-pay

## 2014-05-18 DIAGNOSIS — M1710 Unilateral primary osteoarthritis, unspecified knee: Secondary | ICD-10-CM | POA: Diagnosis present

## 2014-05-18 DIAGNOSIS — Z01810 Encounter for preprocedural cardiovascular examination: Secondary | ICD-10-CM | POA: Diagnosis not present

## 2014-05-18 DIAGNOSIS — Z01812 Encounter for preprocedural laboratory examination: Secondary | ICD-10-CM | POA: Insufficient documentation

## 2014-05-18 HISTORY — DX: Acute lymphadenitis of upper limb: L04.2

## 2014-05-18 HISTORY — DX: Cardiac murmur, unspecified: R01.1

## 2014-05-18 LAB — CBC
HCT: 36.3 % (ref 36.0–46.0)
Hemoglobin: 12.4 g/dL (ref 12.0–15.0)
MCH: 28.8 pg (ref 26.0–34.0)
MCHC: 34.2 g/dL (ref 30.0–36.0)
MCV: 84.2 fL (ref 78.0–100.0)
PLATELETS: 287 10*3/uL (ref 150–400)
RBC: 4.31 MIL/uL (ref 3.87–5.11)
RDW: 14.3 % (ref 11.5–15.5)
WBC: 4.9 10*3/uL (ref 4.0–10.5)

## 2014-05-18 LAB — TYPE AND SCREEN
ABO/RH(D): AB POS
ANTIBODY SCREEN: NEGATIVE

## 2014-05-18 LAB — COMPREHENSIVE METABOLIC PANEL
ALBUMIN: 3.6 g/dL (ref 3.5–5.2)
ALK PHOS: 65 U/L (ref 39–117)
ALT: 9 U/L (ref 0–35)
AST: 16 U/L (ref 0–37)
Anion gap: 13 (ref 5–15)
BILIRUBIN TOTAL: 0.4 mg/dL (ref 0.3–1.2)
BUN: 10 mg/dL (ref 6–23)
CO2: 27 mEq/L (ref 19–32)
Calcium: 9.6 mg/dL (ref 8.4–10.5)
Chloride: 101 mEq/L (ref 96–112)
Creatinine, Ser: 0.79 mg/dL (ref 0.50–1.10)
GFR calc Af Amer: 89 mL/min — ABNORMAL LOW (ref >90)
GFR calc non Af Amer: 77 mL/min — ABNORMAL LOW (ref >90)
Glucose, Bld: 94 mg/dL (ref 70–99)
POTASSIUM: 3.9 meq/L (ref 3.7–5.3)
Sodium: 141 mEq/L (ref 137–147)
TOTAL PROTEIN: 7.4 g/dL (ref 6.0–8.3)

## 2014-05-18 LAB — APTT: aPTT: 31 seconds (ref 24–37)

## 2014-05-18 LAB — PROTIME-INR
INR: 0.98 (ref 0.00–1.49)
Prothrombin Time: 13 seconds (ref 11.6–15.2)

## 2014-05-18 LAB — ABO/RH: ABO/RH(D): AB POS

## 2014-05-18 LAB — SURGICAL PCR SCREEN
MRSA, PCR: NEGATIVE
Staphylococcus aureus: NEGATIVE

## 2014-05-18 NOTE — Telephone Encounter (Signed)
Pt states she is having knee surgery next wed 05/23/14. Went for her pre-op appt this morning they have done her EKG & labs, but they stated md would have to clear her for surgery. Pt states when she saw md at last visit they had discuss the surgery. Dr. Alain Marion you do not have any appt available for medical clearance. Since labs & EKG has been done she is wanting can you clear her for surgery...Johny Chess

## 2014-05-19 NOTE — Telephone Encounter (Signed)
Notified pt with md response. Made appt for mon @11 :00...[name] [name]

## 2014-05-19 NOTE — Telephone Encounter (Signed)
Pls sch appt w/avail provider Thx

## 2014-05-22 ENCOUNTER — Ambulatory Visit (INDEPENDENT_AMBULATORY_CARE_PROVIDER_SITE_OTHER): Payer: Medicare Other | Admitting: Internal Medicine

## 2014-05-22 ENCOUNTER — Encounter: Payer: Self-pay | Admitting: Internal Medicine

## 2014-05-22 VITALS — BP 132/80 | HR 83 | Temp 98.5°F | Wt 205.0 lb

## 2014-05-22 DIAGNOSIS — E8989 Other postprocedural endocrine and metabolic complications and disorders: Secondary | ICD-10-CM | POA: Insufficient documentation

## 2014-05-22 DIAGNOSIS — Z01818 Encounter for other preprocedural examination: Secondary | ICD-10-CM

## 2014-05-22 DIAGNOSIS — C50919 Malignant neoplasm of unspecified site of unspecified female breast: Secondary | ICD-10-CM

## 2014-05-22 DIAGNOSIS — R634 Abnormal weight loss: Secondary | ICD-10-CM

## 2014-05-22 DIAGNOSIS — I10 Essential (primary) hypertension: Secondary | ICD-10-CM

## 2014-05-22 DIAGNOSIS — E538 Deficiency of other specified B group vitamins: Secondary | ICD-10-CM

## 2014-05-22 DIAGNOSIS — I890 Lymphedema, not elsewhere classified: Secondary | ICD-10-CM

## 2014-05-22 DIAGNOSIS — D509 Iron deficiency anemia, unspecified: Secondary | ICD-10-CM

## 2014-05-22 NOTE — Assessment & Plan Note (Signed)
Dr Jana Hakim R 1994 L 2009 in situ  CXR

## 2014-05-22 NOTE — Assessment & Plan Note (Signed)
Resolved

## 2014-05-22 NOTE — Progress Notes (Signed)
Patient ID: [name] [name], female   DOB: Jan 12, 1933, 80 y.o.   MRN: [id number]  - Subjective:    HPI  Pre-op IM consult Req by Dr Sharol Given Reason: R TKR on 05/24/14 - med clearance Hx: chronic hypertension, dyslipidemia, chronic mild anemia, B12 def - all stable  Past Medical History  Diagnosis Date  . Anemia     iron deficiency  . Diverticulosis of colon 2004    Dr Deatra Ina  . Hyperlipidemia   . Hypertension   . Vitamin D deficiency   . Vitamin B12 deficiency   . Hematuria     Dr Terance Hart  . Depression 2009    grief  . Osteoarthritis, knee   . Cancer     breast, skin  . GERD (gastroesophageal reflux disease)   . Heart murmur   . Acute lymphadenitis of arm    Past Surgical History  Procedure Laterality Date  . Total knee arthroplasty  06-04-98  . Mastectomy  09-13-08    left and right  . L groin skin cancer  2011  . Joint replacement    . Abdominal hysterectomy      reports that she has never smoked. She does not have any smokeless tobacco history on file. She reports that she does not drink alcohol or use illicit drugs. family history includes Cancer in her maternal aunt, other, and sister; Coronary artery disease in her other. Allergies  Allergen Reactions  . Anastrozole     REACTION: arthralgias  . Aspirin Other (See Comments)    Gi upset   . Ciprofloxacin Itching  . Letrozole     REACTION: leg pain   Current Outpatient Prescriptions on File Prior to Visit  Medication Sig Dispense Refill  . acetaminophen (TYLENOL) 500 MG tablet Take 1,000 mg by mouth every morning.      Marland Kitchen amLODipine (NORVASC) 10 MG tablet Take 10 mg by mouth daily.      Marland Kitchen losartan (COZAAR) 100 MG tablet Take 100 mg by mouth daily.      . Multiple Vitamin (MULTIVITAMIN WITH MINERALS) TABS tablet Take 1 tablet by mouth daily.      . potassium chloride SA (K-DUR,KLOR-CON) 20 MEQ tablet Take 20 mEq by mouth 2 (two) times daily.      Marland Kitchen triamcinolone cream (KENALOG) 0.5 % Apply topically 2 (two) times  daily.  30 g  3   No current facility-administered medications on file prior to visit.     Review of Systems  Constitutional: Positive for fatigue. Negative for fever, chills, diaphoresis, activity change, appetite change and unexpected weight change.  HENT: Negative for congestion, ear pain, facial swelling, hearing loss, mouth sores, nosebleeds, postnasal drip, rhinorrhea, sinus pressure, sneezing, sore throat, tinnitus and trouble swallowing.   Eyes: Negative for pain, discharge, redness, itching and visual disturbance.  Respiratory: Negative for cough, chest tightness, shortness of breath, wheezing and stridor.   Cardiovascular: Negative for chest pain, palpitations and leg swelling.  Gastrointestinal: Negative for nausea, vomiting, abdominal pain, diarrhea, constipation, blood in stool, abdominal distention, anal bleeding and rectal pain.  Genitourinary: Negative for dysuria, urgency, frequency, hematuria, flank pain, vaginal bleeding, vaginal discharge, difficulty urinating, genital sores, vaginal pain and pelvic pain.  Musculoskeletal: Positive for arthralgias, back pain and gait problem. Negative for joint swelling, neck pain and neck stiffness.  Skin: Negative.  Negative for pallor and rash.  Neurological: Negative for dizziness, tremors, seizures, syncope, speech difficulty, weakness, numbness and headaches.  Hematological: Negative for adenopathy. Does not bruise/bleed  easily.  Psychiatric/Behavioral: Negative for suicidal ideas, behavioral problems, confusion, sleep disturbance, dysphoric mood and decreased concentration. The patient is not nervous/anxious.    Wt Readings from Last 3 Encounters:  05/22/14 205 lb (92.987 kg)  05/18/14 203 lb 9.6 oz (92.352 kg)  03/17/14 207 lb (93.895 kg)   BP Readings from Last 3 Encounters:  05/22/14 132/80  05/18/14 141/70  03/17/14 130/80   BP 132/80  Pulse 83  Temp(Src) 98.5 F (36.9 C) (Oral)  Wt 205 lb (92.987 kg)  SpO2 97%       Objective:   Physical Exam  Constitutional: She appears well-developed. No distress.  NAD  HENT:  Head: Normocephalic.  Right Ear: External ear normal.  Left Ear: External ear normal.  Nose: Nose normal.  Mouth/Throat: Oropharynx is clear and moist.  Eyes: Conjunctivae are normal. Pupils are equal, round, and reactive to light. Right eye exhibits no discharge. Left eye exhibits no discharge.  Neck: Normal range of motion. Neck supple. No JVD present. No tracheal deviation present. No thyromegaly present.  Cardiovascular: Normal rate, regular rhythm and normal heart sounds.   Pulmonary/Chest: No stridor. No respiratory distress. She has no wheezes.  Abdominal: Soft. Bowel sounds are normal. She exhibits no distension and no mass. There is no tenderness. There is no rebound and no guarding.  Musculoskeletal: She exhibits tenderness. She exhibits no edema.  Lymphadenopathy:    She has no cervical adenopathy.  Neurological: She displays normal reflexes. No cranial nerve deficit. She exhibits normal muscle tone. Coordination abnormal.  Cane  Skin: No rash noted. No erythema.  Psychiatric: She has a normal mood and affect. Her behavior is normal. Judgment and thought content normal.  L hand is a little puffy  Lab Results  Component Value Date   WBC 4.9 05/18/2014   HGB 12.4 05/18/2014   HCT 36.3 05/18/2014   PLT 287 05/18/2014   GLUCOSE 94 05/18/2014   ALT 9 05/18/2014   AST 16 05/18/2014   NA 141 05/18/2014   K 3.9 05/18/2014   CL 101 05/18/2014   CREATININE 0.79 05/18/2014   BUN 10 05/18/2014   CO2 27 05/18/2014   TSH 1.32 02/04/2013   INR 0.98 05/18/2014   EKG - no new changes       Assessment & Plan:

## 2014-05-22 NOTE — Progress Notes (Signed)
Pre visit review using our clinic review tool, if applicable. No additional management support is needed unless otherwise documented below in the visit note. 

## 2014-05-22 NOTE — Assessment & Plan Note (Addendum)
Ms. [name] [name] is clear for surgery (R TKR on 05/24/14). She has a low risk for CV complications. Will get a CXR today. She is clear for R TKR on 05/24/14. Thank you!

## 2014-05-22 NOTE — Assessment & Plan Note (Addendum)
Very mild.... (S/p L mastectomy 1994 ) Elevate arm CXR No IVs on the left

## 2014-05-22 NOTE — Assessment & Plan Note (Signed)
Continue with current prescription therapy as reflected on the Med list.  

## 2014-05-22 NOTE — Assessment & Plan Note (Signed)
Wt Readings from Last 3 Encounters:  05/22/14 205 lb (92.987 kg)  05/18/14 203 lb 9.6 oz (92.352 kg)  03/17/14 207 lb (93.895 kg)

## 2014-05-23 ENCOUNTER — Ambulatory Visit (INDEPENDENT_AMBULATORY_CARE_PROVIDER_SITE_OTHER)
Admission: RE | Admit: 2014-05-23 | Discharge: 2014-05-23 | Disposition: A | Discharge status: Home / Self Care | Payer: Medicare Other | Source: Ambulatory Visit | Attending: Internal Medicine | Admitting: Internal Medicine

## 2014-05-23 DIAGNOSIS — IMO0002 Reserved for concepts with insufficient information to code with codable children: Secondary | ICD-10-CM

## 2014-05-23 DIAGNOSIS — Z01818 Encounter for other preprocedural examination: Secondary | ICD-10-CM

## 2014-05-23 DIAGNOSIS — I890 Lymphedema, not elsewhere classified: Secondary | ICD-10-CM

## 2014-05-23 DIAGNOSIS — C50919 Malignant neoplasm of unspecified site of unspecified female breast: Secondary | ICD-10-CM

## 2014-05-23 MED ORDER — CEFAZOLIN SODIUM-DEXTROSE 2-3 GM-% IV SOLR
2.0000 g | INTRAVENOUS | Status: AC
  Administered 2014-05-24: 2 g via INTRAVENOUS
  Filled 2014-05-23: qty 50

## 2014-05-24 ENCOUNTER — Encounter (HOSPITAL_COMMUNITY)
Admission: RE | Disposition: A | Discharge status: Home Health | Payer: Self-pay | Source: Ambulatory Visit | Attending: Orthopedic Surgery

## 2014-05-24 ENCOUNTER — Encounter (HOSPITAL_COMMUNITY): Payer: Self-pay | Admitting: *Deleted

## 2014-05-24 ENCOUNTER — Encounter (HOSPITAL_COMMUNITY): Payer: Medicare Other | Admitting: Certified Registered Nurse Anesthetist

## 2014-05-24 ENCOUNTER — Inpatient Hospital Stay (HOSPITAL_COMMUNITY)
Admission: RE | Admit: 2014-05-24 | Discharge: 2014-05-27 | DRG: 470 | Disposition: A | Discharge status: Home Health | Payer: Medicare Other | Source: Ambulatory Visit | Attending: Orthopedic Surgery | Admitting: Orthopedic Surgery

## 2014-05-24 ENCOUNTER — Inpatient Hospital Stay (HOSPITAL_COMMUNITY): Payer: Medicare Other | Admitting: Certified Registered Nurse Anesthetist

## 2014-05-24 DIAGNOSIS — Z888 Allergy status to other drugs, medicaments and biological substances status: Secondary | ICD-10-CM

## 2014-05-24 DIAGNOSIS — E538 Deficiency of other specified B group vitamins: Secondary | ICD-10-CM | POA: Diagnosis present

## 2014-05-24 DIAGNOSIS — E785 Hyperlipidemia, unspecified: Secondary | ICD-10-CM | POA: Diagnosis present

## 2014-05-24 DIAGNOSIS — G8929 Other chronic pain: Secondary | ICD-10-CM | POA: Diagnosis present

## 2014-05-24 DIAGNOSIS — K811 Chronic cholecystitis: Secondary | ICD-10-CM | POA: Diagnosis present

## 2014-05-24 DIAGNOSIS — Z8249 Family history of ischemic heart disease and other diseases of the circulatory system: Secondary | ICD-10-CM

## 2014-05-24 DIAGNOSIS — Z886 Allergy status to analgesic agent status: Secondary | ICD-10-CM | POA: Diagnosis not present

## 2014-05-24 DIAGNOSIS — J4489 Other specified chronic obstructive pulmonary disease: Secondary | ICD-10-CM | POA: Diagnosis present

## 2014-05-24 DIAGNOSIS — Z96659 Presence of unspecified artificial knee joint: Secondary | ICD-10-CM

## 2014-05-24 DIAGNOSIS — J449 Chronic obstructive pulmonary disease, unspecified: Secondary | ICD-10-CM | POA: Diagnosis present

## 2014-05-24 DIAGNOSIS — M25569 Pain in unspecified knee: Secondary | ICD-10-CM | POA: Diagnosis present

## 2014-05-24 DIAGNOSIS — M1710 Unilateral primary osteoarthritis, unspecified knee: Principal | ICD-10-CM | POA: Diagnosis present

## 2014-05-24 DIAGNOSIS — K219 Gastro-esophageal reflux disease without esophagitis: Secondary | ICD-10-CM | POA: Diagnosis present

## 2014-05-24 DIAGNOSIS — M8708 Idiopathic aseptic necrosis of bone, other site: Secondary | ICD-10-CM | POA: Diagnosis present

## 2014-05-24 DIAGNOSIS — M810 Age-related osteoporosis without current pathological fracture: Secondary | ICD-10-CM | POA: Diagnosis present

## 2014-05-24 DIAGNOSIS — Z881 Allergy status to other antibiotic agents status: Secondary | ICD-10-CM | POA: Diagnosis not present

## 2014-05-24 DIAGNOSIS — E559 Vitamin D deficiency, unspecified: Secondary | ICD-10-CM | POA: Diagnosis present

## 2014-05-24 DIAGNOSIS — Z853 Personal history of malignant neoplasm of breast: Secondary | ICD-10-CM | POA: Diagnosis not present

## 2014-05-24 DIAGNOSIS — Z96651 Presence of right artificial knee joint: Secondary | ICD-10-CM

## 2014-05-24 DIAGNOSIS — Z803 Family history of malignant neoplasm of breast: Secondary | ICD-10-CM

## 2014-05-24 DIAGNOSIS — Z9010 Acquired absence of unspecified breast and nipple: Secondary | ICD-10-CM

## 2014-05-24 DIAGNOSIS — Z85828 Personal history of other malignant neoplasm of skin: Secondary | ICD-10-CM | POA: Diagnosis not present

## 2014-05-24 DIAGNOSIS — I10 Essential (primary) hypertension: Secondary | ICD-10-CM | POA: Diagnosis present

## 2014-05-24 HISTORY — PX: TOTAL KNEE ARTHROPLASTY: SHX125

## 2014-05-24 SURGERY — ARTHROPLASTY, KNEE, TOTAL
Anesthesia: Spinal | Site: Knee | Laterality: Right

## 2014-05-24 MED ORDER — WARFARIN SODIUM 7.5 MG PO TABS
7.5000 mg | ORAL_TABLET | Freq: Once | ORAL | Status: AC
  Administered 2014-05-24: 7.5 mg via ORAL
  Filled 2014-05-24: qty 1

## 2014-05-24 MED ORDER — MENTHOL 3 MG MT LOZG
1.0000 | LOZENGE | OROMUCOSAL | Status: DC | PRN
Start: 2014-05-24 — End: 2014-05-27

## 2014-05-24 MED ORDER — CEFAZOLIN SODIUM 1-5 GM-% IV SOLN
1.0000 g | Freq: Four times a day (QID) | INTRAVENOUS | Status: AC
  Administered 2014-05-24 (×2): 1 g via INTRAVENOUS
  Filled 2014-05-24 (×2): qty 50

## 2014-05-24 MED ORDER — METOCLOPRAMIDE HCL 5 MG/ML IJ SOLN
5.0000 mg | Freq: Three times a day (TID) | INTRAMUSCULAR | Status: DC | PRN
  Administered 2014-05-25: 10 mg via INTRAVENOUS
  Filled 2014-05-24: qty 2

## 2014-05-24 MED ORDER — PROPOFOL 10 MG/ML IV BOLUS
INTRAVENOUS | Status: DC | PRN
  Administered 2014-05-24 (×4): 20 mg via INTRAVENOUS
  Administered 2014-05-24 (×3): 10 mg via INTRAVENOUS
  Administered 2014-05-24 (×2): 20 mg via INTRAVENOUS
  Administered 2014-05-24 (×4): 10 mg via INTRAVENOUS

## 2014-05-24 MED ORDER — MIDAZOLAM HCL 2 MG/2ML IJ SOLN
INTRAMUSCULAR | Status: AC
  Filled 2014-05-24: qty 2

## 2014-05-24 MED ORDER — LOSARTAN POTASSIUM 50 MG PO TABS
100.0000 mg | ORAL_TABLET | Freq: Every day | ORAL | Status: DC
  Administered 2014-05-24 – 2014-05-27 (×4): 100 mg via ORAL
  Filled 2014-05-24 (×4): qty 2

## 2014-05-24 MED ORDER — PROMETHAZINE HCL 25 MG/ML IJ SOLN: 6.2500 mg | INTRAMUSCULAR | Status: DC | PRN

## 2014-05-24 MED ORDER — COUMADIN BOOK
Freq: Once | Status: AC
Start: 2014-05-24 — End: 2014-05-24
  Administered 2014-05-24: 12:00:00
  Filled 2014-05-24: qty 1

## 2014-05-24 MED ORDER — ONDANSETRON HCL 4 MG/2ML IJ SOLN
4.0000 mg | Freq: Four times a day (QID) | INTRAMUSCULAR | Status: DC | PRN
  Administered 2014-05-24 – 2014-05-25 (×2): 4 mg via INTRAVENOUS
  Filled 2014-05-24 (×2): qty 2

## 2014-05-24 MED ORDER — BUPIVACAINE IN DEXTROSE 0.75-8.25 % IT SOLN
INTRATHECAL | Status: DC | PRN
  Administered 2014-05-24: 1.5 mL via INTRATHECAL

## 2014-05-24 MED ORDER — INFLUENZA VAC SPLIT QUAD 0.5 ML IM SUSY
0.5000 mL | PREFILLED_SYRINGE | INTRAMUSCULAR | Status: AC
  Administered 2014-05-25: 0.5 mL via INTRAMUSCULAR
  Filled 2014-05-24: qty 0.5

## 2014-05-24 MED ORDER — ONDANSETRON HCL 4 MG PO TABS
4.0000 mg | ORAL_TABLET | Freq: Four times a day (QID) | ORAL | Status: DC | PRN
  Filled 2014-05-24: qty 1

## 2014-05-24 MED ORDER — PROPOFOL 10 MG/ML IV BOLUS
INTRAVENOUS | Status: AC
  Filled 2014-05-24: qty 20

## 2014-05-24 MED ORDER — SODIUM CHLORIDE 0.9 % IV SOLN
INTRAVENOUS | Status: DC
  Administered 2014-05-24 – 2014-05-25 (×2): via INTRAVENOUS

## 2014-05-24 MED ORDER — WARFARIN - PHARMACIST DOSING INPATIENT: Freq: Every day | Status: DC

## 2014-05-24 MED ORDER — ACETAMINOPHEN 325 MG PO TABS: 650.0000 mg | ORAL_TABLET | Freq: Four times a day (QID) | ORAL | Status: DC | PRN

## 2014-05-24 MED ORDER — METOCLOPRAMIDE HCL 10 MG PO TABS: 5.0000 mg | ORAL_TABLET | Freq: Three times a day (TID) | ORAL | Status: DC | PRN

## 2014-05-24 MED ORDER — 0.9 % SODIUM CHLORIDE (POUR BTL) OPTIME
TOPICAL | Status: DC | PRN
  Administered 2014-05-24: 1000 mL

## 2014-05-24 MED ORDER — DIPHENHYDRAMINE HCL 12.5 MG/5ML PO ELIX: 12.5000 mg | ORAL_SOLUTION | ORAL | Status: DC | PRN

## 2014-05-24 MED ORDER — POTASSIUM CHLORIDE CRYS ER 20 MEQ PO TBCR
20.0000 meq | EXTENDED_RELEASE_TABLET | Freq: Two times a day (BID) | ORAL | Status: DC
  Administered 2014-05-24 – 2014-05-27 (×6): 20 meq via ORAL
  Filled 2014-05-24 (×8): qty 1

## 2014-05-24 MED ORDER — FENTANYL CITRATE 0.05 MG/ML IJ SOLN
INTRAMUSCULAR | Status: DC | PRN
  Administered 2014-05-24: 50 ug via INTRAVENOUS

## 2014-05-24 MED ORDER — DOCUSATE SODIUM 100 MG PO CAPS
100.0000 mg | ORAL_CAPSULE | Freq: Two times a day (BID) | ORAL | Status: DC
Start: 2014-05-24 — End: 2014-05-27
  Administered 2014-05-24 – 2014-05-27 (×6): 100 mg via ORAL
  Filled 2014-05-24 (×8): qty 1

## 2014-05-24 MED ORDER — PHENOL 1.4 % MT LIQD
1.0000 | OROMUCOSAL | Status: DC | PRN
Start: 2014-05-24 — End: 2014-05-27

## 2014-05-24 MED ORDER — SODIUM CHLORIDE 0.9 % IR SOLN
Status: DC | PRN
  Administered 2014-05-24: 1000 mL

## 2014-05-24 MED ORDER — MEPERIDINE HCL 25 MG/ML IJ SOLN: 6.2500 mg | INTRAMUSCULAR | Status: DC | PRN

## 2014-05-24 MED ORDER — BUPIVACAINE LIPOSOME 1.3 % IJ SUSP
20.0000 mL | INTRAMUSCULAR | Status: AC
  Administered 2014-05-24: 20 mL
  Filled 2014-05-24: qty 20

## 2014-05-24 MED ORDER — OXYCODONE HCL 5 MG PO TABS
5.0000 mg | ORAL_TABLET | ORAL | Status: DC | PRN
  Administered 2014-05-24 – 2014-05-27 (×6): 10 mg via ORAL
  Filled 2014-05-24 (×6): qty 2

## 2014-05-24 MED ORDER — HYDROMORPHONE HCL 1 MG/ML IJ SOLN
INTRAMUSCULAR | Status: AC
  Administered 2014-05-24: 1 mg
  Filled 2014-05-24: qty 1

## 2014-05-24 MED ORDER — PHENYLEPHRINE HCL 10 MG/ML IJ SOLN
INTRAMUSCULAR | Status: DC | PRN
  Administered 2014-05-24: 40 ug via INTRAVENOUS

## 2014-05-24 MED ORDER — METHOCARBAMOL 1000 MG/10ML IJ SOLN
500.0000 mg | Freq: Four times a day (QID) | INTRAVENOUS | Status: DC | PRN
  Filled 2014-05-24: qty 5

## 2014-05-24 MED ORDER — AMLODIPINE BESYLATE 10 MG PO TABS
10.0000 mg | ORAL_TABLET | Freq: Every day | ORAL | Status: DC
  Administered 2014-05-25 – 2014-05-27 (×3): 10 mg via ORAL
  Filled 2014-05-24 (×3): qty 1

## 2014-05-24 MED ORDER — BISACODYL 5 MG PO TBEC: 5.0000 mg | DELAYED_RELEASE_TABLET | Freq: Every day | ORAL | Status: DC | PRN

## 2014-05-24 MED ORDER — ACETAMINOPHEN 650 MG RE SUPP: 650.0000 mg | Freq: Four times a day (QID) | RECTAL | Status: DC | PRN

## 2014-05-24 MED ORDER — METHOCARBAMOL 500 MG PO TABS
500.0000 mg | ORAL_TABLET | Freq: Four times a day (QID) | ORAL | Status: DC | PRN
Start: 2014-05-24 — End: 2014-05-27
  Administered 2014-05-24 – 2014-05-25 (×3): 500 mg via ORAL
  Filled 2014-05-24 (×3): qty 1

## 2014-05-24 MED ORDER — FENTANYL CITRATE 0.05 MG/ML IJ SOLN: 25.0000 ug | INTRAMUSCULAR | Status: DC | PRN

## 2014-05-24 MED ORDER — FENTANYL CITRATE 0.05 MG/ML IJ SOLN
INTRAMUSCULAR | Status: AC
  Filled 2014-05-24: qty 5

## 2014-05-24 MED ORDER — LACTATED RINGERS IV SOLN
INTRAVENOUS | Status: DC | PRN
  Administered 2014-05-24 (×2): via INTRAVENOUS

## 2014-05-24 MED ORDER — SENNOSIDES-DOCUSATE SODIUM 8.6-50 MG PO TABS
1.0000 | ORAL_TABLET | Freq: Every evening | ORAL | Status: DC | PRN
Start: 2014-05-24 — End: 2014-05-27

## 2014-05-24 MED ORDER — SODIUM CHLORIDE 0.9 % IJ SOLN
INTRAMUSCULAR | Status: DC | PRN
  Administered 2014-05-24: 40 mL

## 2014-05-24 MED ORDER — MAGNESIUM CITRATE PO SOLN: 1.0000 | Freq: Once | ORAL | Status: AC | PRN

## 2014-05-24 MED ORDER — HYDROMORPHONE HCL 1 MG/ML IJ SOLN
1.0000 mg | INTRAMUSCULAR | Status: DC | PRN
  Administered 2014-05-24 – 2014-05-25 (×4): 1 mg via INTRAVENOUS
  Filled 2014-05-24 (×4): qty 1

## 2014-05-24 SURGICAL SUPPLY — 52 items
BLADE SAGITTAL 25.0X1.27X90 (BLADE) ×2 IMPLANT
BLADE SAGITTAL 25.0X1.27X90MM (BLADE) ×1
BLADE SAW SGTL 13.0X1.19X90.0M (BLADE) ×3 IMPLANT
BLADE SURG 21 STRL SS (BLADE) ×4 IMPLANT
BNDG COHESIVE 6X5 TAN STRL LF (GAUZE/BANDAGES/DRESSINGS) ×4 IMPLANT
BNDG GAUZE ELAST 4 BULKY (GAUZE/BANDAGES/DRESSINGS) ×2 IMPLANT
BONE CEMENT PALACOSE (Orthopedic Implant) ×6 IMPLANT
BOWL SMART MIX CTS (DISPOSABLE) ×3 IMPLANT
CAP POR TM CP VIT E LN CER HD ×2 IMPLANT
CEMENT BONE PALACOSE (Orthopedic Implant) ×2 IMPLANT
COVER SURGICAL LIGHT HANDLE (MISCELLANEOUS) ×3 IMPLANT
CUFF TOURNIQUET SINGLE 34IN LL (TOURNIQUET CUFF) ×2 IMPLANT
CUFF TOURNIQUET SINGLE 44IN (TOURNIQUET CUFF) IMPLANT
DRAPE EXTREMITY T 121X128X90 (DRAPE) ×3 IMPLANT
DRAPE PROXIMA HALF (DRAPES) ×3 IMPLANT
DRAPE U-SHAPE 47X51 STRL (DRAPES) ×3 IMPLANT
DRSG ADAPTIC 3X8 NADH LF (GAUZE/BANDAGES/DRESSINGS) ×3 IMPLANT
DRSG PAD ABDOMINAL 8X10 ST (GAUZE/BANDAGES/DRESSINGS) ×3 IMPLANT
DURAPREP 26ML APPLICATOR (WOUND CARE) ×3 IMPLANT
ELECT REM PT RETURN 9FT ADLT (ELECTROSURGICAL) ×3
ELECTRODE REM PT RTRN 9FT ADLT (ELECTROSURGICAL) ×1 IMPLANT
FACESHIELD WRAPAROUND (MASK) ×3 IMPLANT
FACESHIELD WRAPAROUND OR TEAM (MASK) ×1 IMPLANT
GAUZE SPONGE 4X4 12PLY STRL (GAUZE/BANDAGES/DRESSINGS) ×3 IMPLANT
GLOVE BIOGEL PI IND STRL 9 (GLOVE) ×1 IMPLANT
GLOVE BIOGEL PI INDICATOR 9 (GLOVE) ×2
GLOVE SURG ORTHO 9.0 STRL STRW (GLOVE) ×3 IMPLANT
GOWN STRL REUS W/ TWL XL LVL3 (GOWN DISPOSABLE) ×3 IMPLANT
GOWN STRL REUS W/TWL XL LVL3 (GOWN DISPOSABLE) ×3
HANDPIECE INTERPULSE COAX TIP (DISPOSABLE) ×3
KIT BASIN OR (CUSTOM PROCEDURE TRAY) ×3 IMPLANT
KIT ROOM TURNOVER OR (KITS) ×3 IMPLANT
MANIFOLD NEPTUNE II (INSTRUMENTS) ×3 IMPLANT
NDL SPNL 18GX3.5 QUINCKE PK (NEEDLE) ×1 IMPLANT
NEEDLE SPNL 18GX3.5 QUINCKE PK (NEEDLE) ×3 IMPLANT
NS IRRIG 1000ML POUR BTL (IV SOLUTION) ×3 IMPLANT
PACK TOTAL JOINT (CUSTOM PROCEDURE TRAY) ×3 IMPLANT
PAD ARMBOARD 7.5X6 YLW CONV (MISCELLANEOUS) ×6 IMPLANT
PADDING CAST COTTON 6X4 STRL (CAST SUPPLIES) ×3 IMPLANT
SET HNDPC FAN SPRY TIP SCT (DISPOSABLE) ×1 IMPLANT
SPONGE GAUZE 4X4 12PLY STER LF (GAUZE/BANDAGES/DRESSINGS) ×2 IMPLANT
STAPLER VISISTAT 35W (STAPLE) ×3 IMPLANT
SUCTION FRAZIER TIP 10 FR DISP (SUCTIONS) ×2 IMPLANT
SUT VIC AB 0 CTB1 27 (SUTURE) ×4 IMPLANT
SUT VIC AB 1 CTX 36 (SUTURE) ×3
SUT VIC AB 1 CTX36XBRD ANBCTR (SUTURE) IMPLANT
SYR 50ML LL SCALE MARK (SYRINGE) ×3 IMPLANT
TOWEL OR 17X24 6PK STRL BLUE (TOWEL DISPOSABLE) ×3 IMPLANT
TOWEL OR 17X26 10 PK STRL BLUE (TOWEL DISPOSABLE) ×3 IMPLANT
TRAY FOLEY CATH 16FRSI W/METER (SET/KITS/TRAYS/PACK) IMPLANT
WATER STERILE IRR 1000ML POUR (IV SOLUTION) ×2 IMPLANT
WRAP KNEE MAXI GEL POST OP (GAUZE/BANDAGES/DRESSINGS) ×3 IMPLANT

## 2014-05-24 NOTE — Anesthesia Procedure Notes (Signed)
Spinal  Patient location during procedure: OR Staffing Anesthesiologist: Kerstin Crusoe Performed by: anesthesiologist  Preanesthetic Checklist Completed: patient identified, site marked, surgical consent, pre-op evaluation, timeout performed, IV checked, risks and benefits discussed and monitors and equipment checked Spinal Block Patient position: sitting Prep: Betadine Patient monitoring: heart rate, continuous pulse ox and blood pressure Approach: right paramedian Location: L3-4 Injection technique: single-shot Needle Needle type: Sprotte  Needle gauge: 24 G Needle length: 9 cm Additional Notes Expiration date of kit checked and confirmed. Patient tolerated procedure well, without complications.     

## 2014-05-24 NOTE — Progress Notes (Signed)
Utilization review completed.  

## 2014-05-24 NOTE — Progress Notes (Signed)
Occupational Therapy Evaluation Patient Details Name: [name] [name] MRN: [id number] DOB: 21-Dec-1932 Today's Date: 05/24/2014    History of Present Illness pt presents with R TKA and hx of L TKA  in 1999.     Clinical Impression   PTA pt lived at home and was independent with use of SPC. Pt needs assistance for LB ADLs and functional mobility due to pain and R knee limitations. Pt would benefit from acute OT to address safety with tub transfers, AE training, and ADLs.     Follow Up Recommendations  No OT follow up;Supervision/Assistance - 24 hour    Equipment Recommendations  3 in 1 bedside comode    Recommendations for Other Services       Precautions / Restrictions Precautions Precautions: Fall Restrictions Weight Bearing Restrictions: Yes RLE Weight Bearing: Weight bearing as tolerated      Mobility Bed Mobility               General bed mobility comments: Not addressed.   Transfers                 General transfer comment: Not assessed at this time; pt declined OOB activities         ADL Overall ADL's : Needs assistance/impaired Eating/Feeding: Independent;Sitting   Grooming: Set up;Sitting   Upper Body Bathing: Set up;Sitting       Upper Body Dressing : Set up;Sitting                     General ADL Comments: Pt declined OOB at this time as she is comfortable in the recliner and feels fatigued.      Vision  Pt reports no change from baseline.                    Perception Perception Perception Tested?: No   Praxis Praxis Praxis tested?: Within functional limits    Pertinent Vitals/Pain Pain Assessment: 0-10 Pain Score: 4  Pain Location: R knee Pain Descriptors / Indicators: Sore Pain Intervention(s): Limited activity within patient's tolerance;Monitored during session;Repositioned     Hand Dominance Right   Extremity/Trunk Assessment Upper Extremity Assessment Upper Extremity Assessment: Overall WFL for  tasks assessed   Lower Extremity Assessment Lower Extremity Assessment: Defer to PT evaluation   Cervical / Trunk Assessment Cervical / Trunk Assessment: Normal   Communication Communication Communication: No difficulties   Cognition Arousal/Alertness: Awake/alert Behavior During Therapy: WFL for tasks assessed/performed Overall Cognitive Status: Within Functional Limits for tasks assessed                                Home Living Family/patient expects to be discharged to:: Private residence Living Arrangements: Other relatives (Granddaughter) Available Help at Discharge: Family;Available 24 hours/day Type of Home: House Home Access: Stairs to enter [employer] of Steps: 1 Entrance Stairs-Rails: None Home Layout: One level     Bathroom Shower/Tub: Tub/shower unit Shower/tub characteristics: Architectural technologist: Standard     Home Equipment: Cane - single point          Prior Functioning/Environment Level of Independence: Independent with assistive device(s)        Comments: pt uses cane    OT Diagnosis: Generalized weakness;Acute pain   OT Problem List: Decreased strength;Decreased range of motion;Decreased activity tolerance;Decreased safety awareness;Decreased knowledge of use of DME or AE;Decreased knowledge of precautions;Pain   OT Treatment/Interventions: Self-care/ADL training;Therapeutic exercise;Energy conservation;DME  and/or AE instruction;Therapeutic activities;Patient/family education;Balance training    OT Goals(Current goals can be found in the care plan section) Acute Rehab OT Goals Patient Stated Goal: to walk more OT Goal Formulation: With patient Time For Goal Achievement: 06/07/14 Potential to Achieve Goals: Good ADL Goals Pt Will Perform Grooming: with supervision;standing Pt Will Perform Lower Body Bathing: with supervision;sit to/from stand;with adaptive equipment;with set-up Pt Will Perform Lower Body  Dressing: with supervision;with adaptive equipment;sit to/from stand;with set-up Pt Will Transfer to Toilet: with supervision;ambulating;bedside commode Pt Will Perform Toileting - Clothing Manipulation and hygiene: with supervision;sit to/from stand Pt Will Perform Tub/Shower Transfer: Tub transfer;with supervision;ambulating;3 in 1;rolling walker  OT Frequency: Min 2X/week    End of Session  Activity Tolerance: Patient limited by fatigue Patient left: in chair;with call bell/phone within reach;with family/visitor present   Time: 1730-1743 OT Time Calculation (min): 13 min Charges:  OT General Charges $OT Visit: 1 Procedure OT Evaluation $Initial OT Evaluation Tier I: 1 Procedure  Juluis Rainier 05/24/2014, 6:00 PM  Cyndie Chime, OTR/L Occupational Therapist [phone]

## 2014-05-24 NOTE — Anesthesia Postprocedure Evaluation (Signed)
  Anesthesia Post-op Note  Patient: [name] [name]  Procedure(s) Performed: Procedure(s) (LRB): RIGHT TOTAL KNEE ARTHROPLASTY (Right)  Patient Location: PACU  Anesthesia Type: Spinal  Level of Consciousness: awake and alert   Airway and Oxygen Therapy: Patient Spontanous Breathing  Post-op Pain: mild  Post-op Assessment: Post-op Vital signs reviewed, Patient's Cardiovascular Status Stable, Respiratory Function Stable, Patent Airway and No signs of Nausea or vomiting  Last Vitals:  Filed Vitals:   05/24/14 1015  BP: 139/71  Pulse: 64  Temp: 36.4 C  Resp: 18    Post-op Vital Signs: stable   Complications: No apparent anesthesia complications

## 2014-05-24 NOTE — Progress Notes (Signed)
ANTICOAGULATION CONSULT NOTE - Initial Consult  Pharmacy Consult for coumadin  Indication: VTE prophylaxis  Allergies  Allergen Reactions  . Anastrozole     REACTION: arthralgias  . Aspirin Other (See Comments)    Gi upset   . Ciprofloxacin Itching  . Letrozole     REACTION: leg pain    Patient Measurements: Weight: 205 lb (92.987 kg)  Vital Signs: Temp: 97.7 F (36.5 C) (09/23 1103) Temp src: Oral (09/23 1103) BP: 134/53 mmHg (09/23 1103) Pulse Rate: 57 (09/23 1103)  Labs: No results found for this basename: HGB, HCT, PLT, APTT, LABPROT, INR, HEPARINUNFRC, CREATININE, CKTOTAL, CKMB, TROPONINI,  in the last 72 hours  The CrCl is unknown because both a height and weight (above a minimum accepted value) are required for this calculation.   Medical History: Past Medical History  Diagnosis Date  . Anemia     iron deficiency  . Diverticulosis of colon 2004    Dr Deatra Ina  . Hyperlipidemia   . Hypertension   . Vitamin D deficiency   . Vitamin B12 deficiency   . Hematuria     Dr Terance Hart  . Depression 2009    grief  . Osteoarthritis, knee   . Cancer     breast, skin  . GERD (gastroesophageal reflux disease)   . Heart murmur   . Acute lymphadenitis of arm     Medications:  See med rec  Assessment: Patient is a 80 y.o F s/p right TKA today to start coumadin for VTE prophylaxis.  Pre-op INR 0.98.    Goal of Therapy:  INR 2-3    Plan:  1) coumadin 7.5mg  PO x1 today  Mystie Ormand P 05/24/2014,11:16 AM

## 2014-05-24 NOTE — Evaluation (Signed)
Physical Therapy Evaluation Patient Details Name: [name] [name] MRN: [id number] DOB: [birth year] Today's Date: 05/24/2014   History of Present Illness  pt presents with R TKA and hx of L TKA  in 1999.    Clinical Impression  Pt very motivated to work on mobility, however limited by nausea and dizziness today.  Feel pt will make great progress once nausea resolves.  Will continue to follow while on acute.      Follow Up Recommendations Home health PT;Supervision for mobility/OOB    Equipment Recommendations  Rolling walker with 5" wheels;3in1 (PT)    Recommendations for Other Services       Precautions / Restrictions Precautions Precautions: Fall Restrictions Weight Bearing Restrictions: Yes RLE Weight Bearing: Weight bearing as tolerated      Mobility  Bed Mobility Overal bed mobility: Needs Assistance Bed Mobility: Supine to Sit     Supine to sit: Min assist     General bed mobility comments: cues for safe technique.  A only with R LE.    Transfers Overall transfer level: Needs assistance Equipment used: Rolling walker (2 wheeled) Transfers: Sit to/from [employer] Sit to Stand: Mod assist Stand pivot transfers: Min assist       General transfer comment: cues for UE use and anterior wt shift with coming to stand.  pt needs A to power up to standing and for facilitating anterior wt shift.  pt felt nauseated in standing requiring pt to pivot to recliner.    Ambulation/Gait                Stairs            Wheelchair Mobility    Modified Rankin (Stroke Patients Only)       Balance Overall balance assessment: Needs assistance         Standing balance support: Bilateral upper extremity supported Standing balance-Leahy Scale: Poor                               Pertinent Vitals/Pain Pain Assessment: 0-10 Pain Score: 5  Pain Location: R knee Pain Descriptors / Indicators: Tightness;Sore Pain Intervention(s):  Premedicated before session;Repositioned    Home Living Family/patient expects to be discharged to:: Private residence Living Arrangements: Other relatives (Granddaughter.) Available Help at Discharge: Family;Available 24 hours/day Type of Home: House Home Access: Stairs to enter Entrance Stairs-Rails: None Entrance Stairs-Number of Steps: 1 Home Layout: One level Home Equipment: Cane - single point      Prior Function Level of Independence: Independent with assistive device(s)               Hand Dominance        Extremity/Trunk Assessment   Upper Extremity Assessment: Defer to OT evaluation           Lower Extremity Assessment: RLE deficits/detail RLE Deficits / Details: Strength limited post-op.  AAROM ~ 15 - 60    Cervical / Trunk Assessment: Normal  Communication   Communication: No difficulties  Cognition Arousal/Alertness: Awake/alert Behavior During Therapy: WFL for tasks assessed/performed Overall Cognitive Status: Within Functional Limits for tasks assessed                      General Comments      Exercises        Assessment/Plan    PT Assessment Patient needs continued PT services  PT Diagnosis Abnormality of gait;Acute pain  PT Problem List Decreased strength;Decreased range of motion;Decreased activity tolerance;Decreased balance;Decreased mobility;Decreased knowledge of use of DME;Pain  PT Treatment Interventions DME instruction;Gait training;Stair training;Functional mobility training;Therapeutic activities;Balance training;Therapeutic exercise;Patient/family education   PT Goals (Current goals can be found in the Care Plan section) Acute Rehab PT Goals Patient Stated Goal: Walk without pain.   PT Goal Formulation: With patient Time For Goal Achievement: 05/31/14 Potential to Achieve Goals: Good    Frequency 7X/week   Barriers to discharge        Co-evaluation               End of Session Equipment Utilized  During Treatment: Gait belt Activity Tolerance:  (Limited by dizziness and nausea.  ) Patient left: in chair;with call bell/phone within reach Nurse Communication: Mobility status         Time: 2993-7169 PT Time Calculation (min): 29 min   Charges:   PT Evaluation $Initial PT Evaluation Tier I: 1 Procedure PT Treatments $Therapeutic Activity: 23-37 mins   PT G CodesCatarina Hartshorn, [place] 05/24/2014, 3:04 PM

## 2014-05-24 NOTE — Progress Notes (Signed)
Orthopedic Tech Progress Note Patient Details:  [name] [name] 1932-12-28 [id number]  Ortho Devices Type of Ortho Device:  (footsie roll) Ortho Device/Splint Interventions: Application   Hildred Priest 05/24/2014, 2:14 PM

## 2014-05-24 NOTE — Anesthesia Preprocedure Evaluation (Addendum)
Anesthesia Evaluation  Patient identified by MRN, date of birth, ID band Patient awake    Reviewed: Allergy & Precautions, H&P , NPO status , Patient's Chart, lab work & pertinent test results  Airway Mallampati: II TM Distance: >3 FB Neck ROM: Full    Dental no notable dental hx.    Pulmonary neg pulmonary ROS,  breath sounds clear to auscultation  Pulmonary exam normal       Cardiovascular hypertension, Pt. on medications negative cardio ROS  Rhythm:Regular Rate:Normal     Neuro/Psych negative neurological ROS  negative psych ROS   GI/Hepatic negative GI ROS, Neg liver ROS,   Endo/Other  negative endocrine ROS  Renal/GU negative Renal ROS  negative genitourinary   Musculoskeletal negative musculoskeletal ROS (+)   Abdominal   Peds negative pediatric ROS (+)  Hematology negative hematology ROS (+)   Anesthesia Other Findings   Reproductive/Obstetrics negative OB ROS                          Anesthesia Physical Anesthesia Plan  ASA: II  Anesthesia Plan: Spinal   Post-op Pain Management:    Induction:   Airway Management Planned: Simple Face Mask  Additional Equipment:   Intra-op Plan:   Post-operative Plan:   Informed Consent: I have reviewed the patients History and Physical, chart, labs and discussed the procedure including the risks, benefits and alternatives for the proposed anesthesia with the patient or authorized representative who has indicated his/her understanding and acceptance.   Dental advisory given  Plan Discussed with: CRNA  Anesthesia Plan Comments:         Anesthesia Quick Evaluation

## 2014-05-24 NOTE — Op Note (Signed)
05/24/2014  10:01 AM  PATIENT:  [name] [name]    PRE-OPERATIVE DIAGNOSIS:  Osteoarthritis Right Knee  POST-OPERATIVE DIAGNOSIS:  Same  PROCEDURE:  RIGHT TOTAL KNEE ARTHROPLASTY  SURGEON:  Newt Minion, MD  PHYSICIAN ASSISTANT:None ANESTHESIA:   General  PREOPERATIVE INDICATIONS:  [name] [name] is a  80 y.o. female with a diagnosis of Osteoarthritis Right Knee who failed conservative measures and elected for surgical management.    The risks benefits and alternatives were discussed with the patient preoperatively including but not limited to the risks of infection, bleeding, nerve injury, cardiopulmonary complications, the need for revision surgery, among others, and the patient was willing to proceed.  OPERATIVE IMPLANTS: Zimmer implants. Size 6 femur. Size E. tibia. 16 mm polyethylene tray. 29 mm patella.  Exparel diluted to 60 cc  OPERATIVE FINDINGS: Soft osteoporotic bone  OPERATIVE PROCEDURE: Patient is a 80 year old woman with progressive osteoarthritis of her right knee she has failed prolonged conservative care has pain with activities of daily living and presents at this time for total knee arthroplasty. Patient was brought to the operating room and underwent a spinal anesthetic. After adequate levels of anesthesia were obtained patient's right lower extremity was prepped using DuraPrep draped into a sterile field. A timeout was called. A midline incision was made carried down to a medial parapatellar retinacular incision. Into measuring guide was used to take 10 mm off the distal femur 6 of valgus. Attention was focused on the tibia and 10 mm was taken off the tibia with 3 posterior slope neutral varus and valgus. This sized for a size E. and the notch cuts were made for the size E. tibia. Attention was then focused on the femur and the femur sized for a size 6 and box cuts were made for the size 6 femur set for 3 external rotation. Trial components were placed  patient's knee was stable with 16 mm polyethylene tray. The patella was resurfaced 10 mm was taken off the patella and a 29 mm button was chosen. The knee was irrigated with pulsatile lavage the popliteal fossa was injected with 60 cc of the diluted Exparel. Care was taken not to have an intravascular injection. After cleansing of the components were cemented in place and the knee was left in extension until the cement hardened. The patella button was left clamped in place until the cement hardened. The knee was placed through full range of motion varus and valgus was stable the patella tracked midline. The retinaculum was closed using #1 Vicryl. Subcutaneous is closed using 0 Vicryl. Skin was closed using staples. A sterile compressive dressing was applied. Patient was taken to the PACU in stable condition.

## 2014-05-24 NOTE — Transfer of Care (Signed)
Immediate Anesthesia Transfer of Care Note  Patient: [name] [name]  Procedure(s) Performed: Procedure(s): RIGHT TOTAL KNEE ARTHROPLASTY (Right)  Patient Location: PACU  Anesthesia Type:Spinal  Level of Consciousness: awake, patient cooperative and responds to stimulation  Airway & Oxygen Therapy: Patient Spontanous Breathing and Patient connected to nasal cannula oxygen  Post-op Assessment: Report given to PACU RN and Post -op Vital signs reviewed and stable  Post vital signs: Reviewed and stable  Complications: No apparent anesthesia complications

## 2014-05-24 NOTE — H&P (Signed)
TOTAL KNEE ADMISSION H&P  Patient is being admitted for right total knee arthroplasty.  Subjective:  Chief Complaint:right knee pain.  HPI: [name] [name], 80 y.o. female, has a history of pain and functional disability in the right knee due to arthritis and has failed non-surgical conservative treatments for greater than 12 weeks to includeNSAID's and/or analgesics, use of assistive devices, weight reduction as appropriate and activity modification.  Onset of symptoms was gradual, starting 8 years ago with gradually worsening course since that time. The patient noted no past surgery on the right knee(s).  Patient currently rates pain in the right knee(s) at 8 out of 10 with activity. Patient has night pain, worsening of pain with activity and weight bearing, pain that interferes with activities of daily living, pain with passive range of motion, crepitus and joint swelling.  Patient has evidence of subchondral cysts, subchondral sclerosis, periarticular osteophytes and joint space narrowing by imaging studies. This patient has had avascular necrosis of the knee. There is no active infection.  Patient Active Problem List   Diagnosis Date Noted  . Lymphedema of upper extremity following lymphadenectomy 05/22/2014  . Preop exam for internal medicine 05/22/2014  . Breast cancer 03/17/2014  . Chronic cholecystitis 11/15/2013  . Abnormal gallbladder x-ray 11/08/2013  . Abdominal tenderness of left lower quadrant 11/02/2013  . Diverticulitis of colon without hemorrhage 11/02/2013  . Hematuria 11/02/2013  . Hypokalemia 11/02/2013  . Obstructive chronic bronchitis with exacerbation 09/05/2013  . Fever, unspecified 08/26/2013  . Bilateral breast cancer 05/29/2013  . Dyslipidemia 04/26/2013  . Right elbow pain 02/12/2013  . Mass of right forearm 02/07/2013  . Well adult exam 02/04/2013  . Lipoma of forearm 02/04/2013  . Hematochezia 10/19/2012  . Mass of left side of neck 01/06/2012  .  Diverticulitis 10/10/2011  . Nausea & vomiting 04/16/2011  . Chills 04/16/2011  . LLQ abdominal pain 04/16/2011  . Abdominal pain, chronic, epigastric 08/14/2010  . GERD 06/27/2010  . SKIN CANCER, HX OF 01/31/2010  . SINUSITIS, ACUTE 09/03/2009  . CHILLS WITHOUT FEVER 05/04/2009  . Rash and other nonspecific skin eruption 11/21/2008  . HERPES ZOSTER 10/04/2008  . FATIGUE 10/04/2008  . PARESTHESIA 10/04/2008  . GRIEF REACTION 08/15/2008  . DEPRESSION 08/15/2008  . OSTEOARTHRITIS 08/15/2008  . HYPOKALEMIA 12/22/2007  . WEIGHT LOSS, ABNORMAL 10/21/2007  . Pain in Soft Tissues of Limb 06/21/2007  . CA IN SITU, BREAST 03/27/2007  . B12 DEFICIENCY 03/27/2007  . DEFICIENCY, VITAMIN D NOS 03/27/2007  . HYPERLIPIDEMIA 03/27/2007  . ANEMIA-IRON DEFICIENCY 03/27/2007  . INSOMNIA, PERSISTENT 03/27/2007  . HYPERTENSION 03/27/2007  . BRONCHITIS, ACUTE 03/27/2007  . DIVERTICULOSIS, COLON 03/27/2007   Past Medical History  Diagnosis Date  . Anemia     iron deficiency  . Diverticulosis of colon 2004    Dr Deatra Ina  . Hyperlipidemia   . Hypertension   . Vitamin D deficiency   . Vitamin B12 deficiency   . Hematuria     Dr Terance Hart  . Depression 2009    grief  . Osteoarthritis, knee   . Cancer     breast, skin  . GERD (gastroesophageal reflux disease)   . Heart murmur   . Acute lymphadenitis of arm     Past Surgical History  Procedure Laterality Date  . Total knee arthroplasty  06-04-98  . Mastectomy  09-13-08    left and right  . L groin skin cancer  2011  . Joint replacement    . Abdominal hysterectomy  Prescriptions prior to admission  Medication Sig Dispense Refill  . acetaminophen (TYLENOL) 500 MG tablet Take 1,000 mg by mouth every morning.      Marland Kitchen amLODipine (NORVASC) 10 MG tablet Take 10 mg by mouth daily.      Marland Kitchen losartan (COZAAR) 100 MG tablet Take 100 mg by mouth daily.      . Multiple Vitamin (MULTIVITAMIN WITH MINERALS) TABS tablet Take 1 tablet by mouth daily.       . potassium chloride SA (K-DUR,KLOR-CON) 20 MEQ tablet Take 20 mEq by mouth 2 (two) times daily.      Marland Kitchen triamcinolone cream (KENALOG) 0.5 % Apply topically 2 (two) times daily.  30 g  3  . diphenhydrAMINE (SOMINEX) 25 MG tablet Take 25 mg by mouth at bedtime as needed for sleep.       Allergies  Allergen Reactions  . Anastrozole     REACTION: arthralgias  . Aspirin Other (See Comments)    Gi upset   . Ciprofloxacin Itching  . Letrozole     REACTION: leg pain    History  Substance Use Topics  . Smoking status: Never Smoker   . Smokeless tobacco: Not on file  . Alcohol Use: No    Family History  Problem Relation Age of Onset  . Cancer Sister     breast  . Coronary artery disease Other   . Cancer Other     aunt, niece  . Cancer Maternal Aunt     breast     Review of Systems  All other systems reviewed and are negative.   Objective:  Physical Exam  Vital signs in last 24 hours:    Labs:   Estimated body mass index is 33.43 kg/(m^2) as calculated from the following:   Height as of 11/15/13: 5\' 6"  (1.676 m).   Weight as of 03/17/14: 93.895 kg (207 lb).   Imaging Review Plain radiographs demonstrate moderate degenerative joint disease of the right knee(s). The overall alignment ismild varus. The bone quality appears to be adequate for age and reported activity level.  Assessment/Plan:  End stage arthritis, right knee   The patient history, physical examination, clinical judgment of the provider and imaging studies are consistent with end stage degenerative joint disease of the right knee(s) and total knee arthroplasty is deemed medically necessary. The treatment options including medical management, injection therapy arthroscopy and arthroplasty were discussed at length. The risks and benefits of total knee arthroplasty were presented and reviewed. The risks due to aseptic loosening, infection, stiffness, patella tracking problems, thromboembolic complications and  other imponderables were discussed. The patient acknowledged the explanation, agreed to proceed with the plan and consent was signed. Patient is being admitted for inpatient treatment for surgery, pain control, PT, OT, prophylactic antibiotics, VTE prophylaxis, progressive ambulation and ADL's and discharge planning. The patient is planning to be discharged home with home health services

## 2014-05-25 ENCOUNTER — Other Ambulatory Visit: Payer: Medicare Other

## 2014-05-25 ENCOUNTER — Encounter (HOSPITAL_COMMUNITY): Payer: Self-pay | Admitting: Orthopedic Surgery

## 2014-05-25 DIAGNOSIS — M1710 Unilateral primary osteoarthritis, unspecified knee: Secondary | ICD-10-CM | POA: Diagnosis not present

## 2014-05-25 LAB — CBC WITH DIFFERENTIAL/PLATELET
BASOS PCT: 0 % (ref 0–1)
Basophils Absolute: 0 10*3/uL (ref 0.0–0.1)
EOS PCT: 0 % (ref 0–5)
Eosinophils Absolute: 0 10*3/uL (ref 0.0–0.7)
HCT: 30.1 % — ABNORMAL LOW (ref 36.0–46.0)
Hemoglobin: 10.2 g/dL — ABNORMAL LOW (ref 12.0–15.0)
LYMPHS ABS: 1.5 10*3/uL (ref 0.7–4.0)
Lymphocytes Relative: 22 % (ref 12–46)
MCH: 28.3 pg (ref 26.0–34.0)
MCHC: 33.9 g/dL (ref 30.0–36.0)
MCV: 83.4 fL (ref 78.0–100.0)
Monocytes Absolute: 0.8 10*3/uL (ref 0.1–1.0)
Monocytes Relative: 13 % — ABNORMAL HIGH (ref 3–12)
Neutro Abs: 4.2 10*3/uL (ref 1.7–7.7)
Neutrophils Relative %: 65 % (ref 43–77)
PLATELETS: 233 10*3/uL (ref 150–400)
RBC: 3.61 MIL/uL — AB (ref 3.87–5.11)
RDW: 14.2 % (ref 11.5–15.5)
WBC: 6.5 10*3/uL (ref 4.0–10.5)

## 2014-05-25 LAB — BASIC METABOLIC PANEL
Anion gap: 9 (ref 5–15)
BUN: 11 mg/dL (ref 6–23)
CO2: 28 mEq/L (ref 19–32)
Calcium: 9 mg/dL (ref 8.4–10.5)
Chloride: 98 mEq/L (ref 96–112)
Creatinine, Ser: 0.8 mg/dL (ref 0.50–1.10)
GFR, EST AFRICAN AMERICAN: 79 mL/min — AB (ref >90)
GFR, EST NON AFRICAN AMERICAN: 68 mL/min — AB (ref >90)
GLUCOSE: 122 mg/dL — AB (ref 70–99)
Potassium: 4.1 mEq/L (ref 3.7–5.3)
Sodium: 135 mEq/L — ABNORMAL LOW (ref 137–147)

## 2014-05-25 LAB — PROTIME-INR
INR: 1.16 (ref 0.00–1.49)
PROTHROMBIN TIME: 14.8 s (ref 11.6–15.2)

## 2014-05-25 MED ORDER — WARFARIN SODIUM 7.5 MG PO TABS
7.5000 mg | ORAL_TABLET | Freq: Once | ORAL | Status: AC
  Administered 2014-05-25: 7.5 mg via ORAL
  Filled 2014-05-25: qty 1

## 2014-05-25 NOTE — Progress Notes (Signed)
Physical Therapy Treatment Patient Details Name: [name] [name] MRN: [id number] DOB: 1933/08/06 Today's Date: 05/25/2014    History of Present Illness pt presents with R TKA and hx of L TKA  in 1999.      PT Comments    Patient struggling some with standing initially but with some practice able to stand with less assist. Able to walk to door with chair follow. Patient planning to DC home with family. Will work on progressing ambulation this afternoon  Follow Up Recommendations  Home health PT;Supervision for mobility/OOB     Equipment Recommendations  Rolling walker with 5" wheels;3in1 (PT)    Recommendations for Other Services       Precautions / Restrictions Precautions Precautions: Fall Restrictions Weight Bearing Restrictions: Yes RLE Weight Bearing: Weight bearing as tolerated    Mobility  Bed Mobility               General bed mobility comments: Patient up in recliner before and after session  Transfers Overall transfer level: Needs assistance Equipment used: Rolling walker (2 wheeled) Transfers: Sit to/from Stand Sit to Stand: Mod assist         General transfer comment: Mod A to shift weight anteriorly over BOS. Cues for safe hand placement and to control sit onto recliner  Ambulation/Gait Ambulation/Gait assistance: Min assist Ambulation Distance (Feet): 15 Feet Assistive device: Rolling walker (2 wheeled) Gait Pattern/deviations: Step-to pattern;Decreased stance time - right;Decreased step length - left   Gait velocity interpretation: Below normal speed for age/gender General Gait Details: A for balance and management of RW. Cues for gait sequence and posture   Stairs            Wheelchair Mobility    Modified Rankin (Stroke Patients Only)       Balance                                    Cognition Arousal/Alertness: Awake/alert Behavior During Therapy: WFL for tasks assessed/performed Overall Cognitive Status:  Within Functional Limits for tasks assessed                      Exercises      General Comments        Pertinent Vitals/Pain Pain Assessment: No/denies pain    Home Living                      Prior Function            PT Goals (current goals can now be found in the care plan section) Progress towards PT goals: Progressing toward goals    Frequency  7X/week    PT Plan Current plan remains appropriate    Co-evaluation             End of Session Equipment Utilized During Treatment: Gait belt Activity Tolerance: Patient tolerated treatment well Patient left: in chair;with call bell/phone within reach;with family/visitor present     Time: 6433-2951 PT Time Calculation (min): 28 min  Charges:  $Gait Training: 8-22 mins $Therapeutic Exercise: 8-22 mins                    G Codes:      Jacqualyn Posey 05/25/2014, 12:57 PM  05/25/2014 Jacqualyn Posey PTA [phone] pager [phone] office

## 2014-05-25 NOTE — Progress Notes (Signed)
Patient ID: [name] [name], female   DOB: 01/07/1933, 80 y.o.   MRN: [id number] Postoperative day 1 right total knee arthroplasty. Patient is alert oriented. Plan for physical therapy. Discharge planning as per recommendations from physical therapy.

## 2014-05-25 NOTE — Progress Notes (Signed)
ANTICOAGULATION CONSULT NOTE - Follow Up Consult  Pharmacy Consult for coumadin Indication: VTE prophylaxis  Allergies  Allergen Reactions  . Anastrozole     REACTION: arthralgias  . Aspirin Other (See Comments)    Gi upset   . Ciprofloxacin Itching  . Letrozole     REACTION: leg pain    Patient Measurements: Height: 5\' 6"  (167.6 cm) Weight: 205 lb (92.987 kg) IBW/kg (Calculated) : 59.3   Vital Signs: Temp: 98.9 F (37.2 C) (09/24 0521) Temp src: Oral (09/24 0521) BP: 147/70 mmHg (09/24 0521) Pulse Rate: 83 (09/24 0521)  Labs:  Recent Labs  05/25/14 0645  HGB 10.2*  HCT 30.1*  PLT 233  LABPROT 14.8  INR 1.16  CREATININE 0.80    Estimated Creatinine Clearance: 65.5 ml/min (by C-G formula based on Cr of 0.8).  Assessment: Patient is a 80 y.o F on coumadin for VTE prophylaxis s/p right TKA.  INR is 1.16 after first dose of coumadin given last night.  No bleeding documented.   Goal of Therapy:  INR 2-3    Plan:  1) coumadin 7.5mg  PO x1 today  Dayveon Halley P 05/25/2014,1:28 PM

## 2014-05-25 NOTE — Progress Notes (Signed)
Physical Therapy Treatment Patient Details Name: [name] [name] MRN: [id number] DOB: 1933/03/12 Today's Date: 05/25/2014    History of Present Illness pt presents with R TKA and hx of L TKA  in 1999.      PT Comments    Patient is showing some progress with ambulation and transfers. Patient still having some nausea and inability to keep her food down. She is planning to DC home with family. Anticipate she will need another day or two of acute therapy before returning home.   Follow Up Recommendations  Home health PT;Supervision for mobility/OOB     Equipment Recommendations  Rolling walker with 5" wheels;3in1 (PT)    Recommendations for Other Services       Precautions / Restrictions Precautions Precautions: Fall Restrictions RLE Weight Bearing: Weight bearing as tolerated    Mobility  Bed Mobility               General bed mobility comments: Patient up in recliner before and after session  Transfers Overall transfer level: Needs assistance Equipment used: Rolling walker (2 wheeled) Transfers: Sit to/from Stand Sit to Stand: Min assist         General transfer comment: Patient able to stand with more attention to anterior weight shift. Cues for hand placement   Ambulation/Gait Ambulation/Gait assistance: Min assist Ambulation Distance (Feet): 50 Feet Assistive device: Rolling walker (2 wheeled) Gait Pattern/deviations: Step-to pattern;Decreased step length - right;Decreased stance time - left   Gait velocity interpretation: Below normal speed for age/gender General Gait Details: A for use of RW and balance. Cues for gait sequence and posture   Stairs            Wheelchair Mobility    Modified Rankin (Stroke Patients Only)       Balance                                    Cognition Arousal/Alertness: Awake/alert Behavior During Therapy: WFL for tasks assessed/performed Overall Cognitive Status: Within Functional Limits for  tasks assessed                      Exercises Total Joint Exercises Quad Sets: AROM;Right;10 reps Heel Slides: AAROM;Right;10 reps Hip ABduction/ADduction: AAROM;Right;10 reps Straight Leg Raises: AAROM;Right;10 reps    General Comments        Pertinent Vitals/Pain Pain Assessment: No/denies pain    Home Living                      Prior Function            PT Goals (current goals can now be found in the care plan section) Progress towards PT goals: Progressing toward goals    Frequency  7X/week    PT Plan Current plan remains appropriate    Co-evaluation             End of Session Equipment Utilized During Treatment: Gait belt Activity Tolerance: Patient tolerated treatment well Patient left: in chair;with call bell/phone within reach;with family/visitor present     Time: 5643-3295 PT Time Calculation (min): 24 min  Charges:  $Gait Training: 8-22 mins $Therapeutic Exercise: 8-22 mins                    G Codes:      [name] [name] 05/25/2014, 2:38 PM  05/25/2014 [name] [name] PTA [phone]  pager [phone] office

## 2014-05-25 NOTE — Clinical Social Work Note (Signed)
CSW received consult on 9/23 regarding SNF placement. CSW and nurse case manager Ricki Miller spoke with patient at the bedside and discharge plan is home with home health services and durable medical equipment. CSW signing off, please reconsult if social work services needed.  Bralen Wiltgen Givens, MSW, LCSW [phone]

## 2014-05-25 NOTE — Discharge Instructions (Signed)
Information on my medicine - Coumadin®   (Warfarin) ° °This medication education was reviewed with me or my healthcare representative as part of my discharge preparation.   ° °Why was Coumadin prescribed for you? °Coumadin was prescribed for you because you have a blood clot or a medical condition that can cause an increased risk of forming blood clots. Blood clots can cause serious health problems by blocking the flow of blood to the heart, lung, or brain. Coumadin can prevent harmful blood clots from forming. °As a reminder your indication for Coumadin is:   Blood Clot Prevention After Orthopedic Surgery ° °What test will check on my response to Coumadin? °While on Coumadin (warfarin) you will need to have an INR test regularly to ensure that your dose is keeping you in the desired range. The INR (international normalized ratio) number is calculated from the result of the laboratory test called prothrombin time (PT). ° °If an INR APPOINTMENT HAS NOT ALREADY BEEN MADE FOR YOU please schedule an appointment to have this lab work done by your health care provider within 7 days. °Your INR goal is usually a number between:  2 to 3 or your provider may give you a more narrow range like 2-2.5.  Ask your health care provider during an office visit what your goal INR is. ° °What  do you need to  know  About  COUMADIN? °Take Coumadin (warfarin) exactly as prescribed by your healthcare provider about the same time each day.  DO NOT stop taking without talking to the doctor who prescribed the medication.  Stopping without other blood clot prevention medication to take the place of Coumadin may increase your risk of developing a new clot or stroke.  Get refills before you run out. ° °What do you do if you miss a dose? °If you miss a dose, take it as soon as you remember on the same day then continue your regularly scheduled regimen the next day.  Do not take two doses of Coumadin at the same time. ° °Important Safety  Information °A possible side effect of Coumadin (Warfarin) is an increased risk of bleeding. You should call your healthcare provider right away if you experience any of the following: °  Bleeding from an injury or your nose that does not stop. °  Unusual colored urine (red or dark brown) or unusual colored stools (red or black). °  Unusual bruising for unknown reasons. °  A serious fall or if you hit your head (even if there is no bleeding). ° °Some foods or medicines interact with Coumadin® (warfarin) and might alter your response to warfarin. To help avoid this: °  Eat a balanced diet, maintaining a consistent amount of Vitamin K. °  Notify your provider about major diet changes you plan to make. °  Avoid alcohol or limit your intake to 1 drink for women and 2 drinks for men per day. °(1 drink is 5 oz. wine, 12 oz. beer, or 1.5 oz. liquor.) ° °Make sure that ANY health care provider who prescribes medication for you knows that you are taking Coumadin (warfarin).  Also make sure the healthcare provider who is monitoring your Coumadin knows when you have started a new medication including herbals and non-prescription products. ° °Coumadin® (Warfarin)  Major Drug Interactions  °Increased Warfarin Effect Decreased Warfarin Effect  °Alcohol (large quantities) °Antibiotics (esp. Septra/Bactrim, Flagyl, Cipro) °Amiodarone (Cordarone) °Aspirin (ASA) °Cimetidine (Tagamet) °Megestrol (Megace) °NSAIDs (ibuprofen, naproxen, etc.) °Piroxicam (Feldene) °Propafenone (Rythmol SR) °Propranolol (  Inderal) °Isoniazid (INH) °Posaconazole (Noxafil) Barbiturates (Phenobarbital) °Carbamazepine (Tegretol) °Chlordiazepoxide (Librium) °Cholestyramine (Questran) °Griseofulvin °Oral Contraceptives °Rifampin °Sucralfate (Carafate) °Vitamin K  ° °Coumadin® (Warfarin) Major Herbal Interactions  °Increased Warfarin Effect Decreased Warfarin Effect  °Garlic °Ginseng °Ginkgo biloba Coenzyme Q10 °Green tea °St. John’s wort   ° °Coumadin® (Warfarin)  FOOD Interactions  °Eat a consistent number of servings per week of foods HIGH in Vitamin K °(1 serving = ½ cup)  °Collards (cooked, or boiled & drained) °Kale (cooked, or boiled & drained) °Mustard greens (cooked, or boiled & drained) °Parsley *serving size only = ¼ cup °Spinach (cooked, or boiled & drained) °Swiss chard (cooked, or boiled & drained) °Turnip greens (cooked, or boiled & drained)  °Eat a consistent number of servings per week of foods MEDIUM-HIGH in Vitamin K °(1 serving = 1 cup)  °Asparagus (cooked, or boiled & drained) °Broccoli (cooked, boiled & drained, or raw & chopped) °Brussel sprouts (cooked, or boiled & drained) *serving size only = ½ cup °Lettuce, raw (green leaf, endive, romaine) °Spinach, raw °Turnip greens, raw & chopped  ° °These websites have more information on Coumadin (warfarin):  www.coumadin.com; °www.ahrq.gov/consumer/coumadin.htm; ° ° ° °

## 2014-05-25 NOTE — Care Management Note (Signed)
CARE MANAGEMENT NOTE 05/25/2014  Patient:  [name] [name], [name] [name]   Account Number:  [id number]  Date Initiated:  05/25/2014  Documentation initiated by:  Ricki Miller  Subjective/Objective Assessment:   80 yr old female admitted with osteoarthritis of right knee, s/p right total knee arthroplasty.     Action/Plan:   Case manager spoke with patient concerning home health and DME needs. Patient preoperatively setup with [facility], no changes. Patient states granddaughter lives with her and will assist at discharge.   Anticipated DC Date:  05/26/2014   Anticipated DC Plan:  [place]  CM consult      [place]   Choice offered to / List presented to:  C-1 Patient   DME arranged  3-N-1  [facility]      DME agency  [place] arranged  [place]   Status of service:  Completed, signed off Medicare Important Message given?  NA - LOS <3 / Initial given by admissions (If response is "NO", the following Medicare IM given date fields will be blank) Date Medicare IM given:   Medicare IM given by:   Date Additional Medicare IM given:   Additional Medicare IM given by:    Discharge Disposition:  [place]  Per UR Regulation:  Reviewed for med. necessity/level of care/duration of stay

## 2014-05-25 NOTE — Progress Notes (Signed)
OT Cancellation Note  Patient Details Name: [name] [name] MRN: [id number] DOB: [birth year]   Cancelled Treatment:    Reason Eval/Treat Not Completed: Patient declined, no reason specified. Pt limited by fatigue and lethargy as well as vomiting. She reports feeling weak and has vomited any food she has eaten, including water and Ginger ale. OT will follow up with pt for treatment as appropriate.   [name], OTR/L Occupational Therapist [phone] (pager)  05/25/2014, 3:42 PM

## 2014-05-26 LAB — CBC WITH DIFFERENTIAL/PLATELET
BASOS PCT: 0 % (ref 0–1)
Basophils Absolute: 0 10*3/uL (ref 0.0–0.1)
EOS ABS: 0 10*3/uL (ref 0.0–0.7)
Eosinophils Relative: 1 % (ref 0–5)
HEMATOCRIT: 28.1 % — AB (ref 36.0–46.0)
HEMOGLOBIN: 9.3 g/dL — AB (ref 12.0–15.0)
Lymphocytes Relative: 21 % (ref 12–46)
Lymphs Abs: 1.6 10*3/uL (ref 0.7–4.0)
MCH: 28 pg (ref 26.0–34.0)
MCHC: 33.1 g/dL (ref 30.0–36.0)
MCV: 84.6 fL (ref 78.0–100.0)
MONO ABS: 1.1 10*3/uL — AB (ref 0.1–1.0)
MONOS PCT: 14 % — AB (ref 3–12)
Neutro Abs: 5 10*3/uL (ref 1.7–7.7)
Neutrophils Relative %: 64 % (ref 43–77)
Platelets: 216 10*3/uL (ref 150–400)
RBC: 3.32 MIL/uL — ABNORMAL LOW (ref 3.87–5.11)
RDW: 14.6 % (ref 11.5–15.5)
WBC: 7.8 10*3/uL (ref 4.0–10.5)

## 2014-05-26 LAB — PROTIME-INR
INR: 1.92 — ABNORMAL HIGH (ref 0.00–1.49)
Prothrombin Time: 22 seconds — ABNORMAL HIGH (ref 11.6–15.2)

## 2014-05-26 MED ORDER — WARFARIN SODIUM 2 MG PO TABS
2.0000 mg | ORAL_TABLET | Freq: Once | ORAL | Status: AC
  Administered 2014-05-26: 2 mg via ORAL
  Filled 2014-05-26: qty 1

## 2014-05-26 MED ORDER — METHOCARBAMOL 500 MG PO TABS: 500.0000 mg | ORAL_TABLET | Freq: Three times a day (TID) | ORAL | Status: DC

## 2014-05-26 MED ORDER — OXYCODONE-ACETAMINOPHEN 5-325 MG PO TABS: 1.0000 | ORAL_TABLET | ORAL | Status: DC | PRN

## 2014-05-26 MED ORDER — WARFARIN SODIUM 1 MG PO TABS: 1.0000 mg | ORAL_TABLET | Freq: Every day | ORAL | Status: DC

## 2014-05-26 NOTE — Progress Notes (Signed)
Patient ID: [name] [name], female   DOB: [birth year], 80 y.o.   MRN: [id number] Postoperative day 2 total knee arthroplasty. Patient is progressing well with therapy. Anticipate discharge to home on Saturday. Prescriptions on the chart for discharge. Change dressing and discontinue IV today.

## 2014-05-26 NOTE — Progress Notes (Signed)
Physical Therapy Treatment Patient Details Name: [name] [name] MRN: [id number] DOB: 1933/04/26 Today's Date: 05/26/2014    History of Present Illness pt presents with R TKA and hx of L TKA  in 1999.      PT Comments    Patient making some progress this afternoon with ambulation. Did not require rest break. Patient is planning to DC home tomorrow with family 24/7. She does have one step to enter this house.   Follow Up Recommendations  Home health PT;Supervision for mobility/OOB     Equipment Recommendations  Rolling walker with 5" wheels;3in1 (PT)    Recommendations for Other Services       Precautions / Restrictions Precautions Precautions: Fall Restrictions RLE Weight Bearing: Weight bearing as tolerated    Mobility  Bed Mobility Overal bed mobility: Needs Assistance Bed Mobility: Supine to Sit     Supine to sit: Min assist     General bed mobility comments: min A for R LE out of bed. Patient with correct technique  Transfers Overall transfer level: Needs assistance Equipment used: Rolling walker (2 wheeled) Transfers: Sit to/from Stand Sit to Stand: Min assist         General transfer comment: Continues to need min A for the "follow through" of stand. Cues for hand placement  Ambulation/Gait Ambulation/Gait assistance: Min guard Ambulation Distance (Feet): 80 Feet Assistive device: Rolling walker (2 wheeled) Gait Pattern/deviations: Step-to pattern;Decreased step length - right;Decreased stance time - left   Gait velocity interpretation: Below normal speed for age/gender General Gait Details: Cues for upright posture and step length.    Stairs            Wheelchair Mobility    Modified Rankin (Stroke Patients Only)       Balance                                    Cognition Arousal/Alertness: Awake/alert Behavior During Therapy: WFL for tasks assessed/performed Overall Cognitive Status: Within Functional Limits for  tasks assessed                      Exercises Total Joint Exercises Quad Sets: AROM;Right;10 reps Heel Slides: AAROM;Right;10 reps Hip ABduction/ADduction: AAROM;Right;10 reps Straight Leg Raises: AAROM;Right;10 reps    General Comments        Pertinent Vitals/Pain Pain Assessment: No/denies pain Pain Score: 5  Pain Location: R knee Pain Descriptors / Indicators: Sore Pain Intervention(s): Monitored during session    Home Living                      Prior Function            PT Goals (current goals can now be found in the care plan section) Progress towards PT goals: Progressing toward goals    Frequency  7X/week    PT Plan Current plan remains appropriate    Co-evaluation             End of Session Equipment Utilized During Treatment: Gait belt Activity Tolerance: Patient tolerated treatment well Patient left: in chair;with call bell/phone within reach;with family/visitor present     Time: 1194-1740 PT Time Calculation (min): 17 min  Charges:  $Gait Training: 8-22 mins $Therapeutic Exercise: 8-22 mins                    G Codes:  Jacqualyn Posey 05/26/2014, 2:54 PM  05/26/2014 Jacqualyn Posey PTA [phone] pager [phone] office

## 2014-05-26 NOTE — Progress Notes (Signed)
OT Cancellation Note  Patient Details Name: [name] [name] MRN: [id number] DOB: June 25, 1933   Cancelled Treatment:    Reason Eval/Treat Not Completed: Patient declined, no reason specified. Pt recently walked with PT and has no ADL concerns. She will have assist 24/7 for LB ADLs. Educated pt briefly on fall prevention and importance of mobilization at home and pt agrees. Will continue to check in on pt prior to d/c to address ADL and functional mobility.   Villa Herb M  Cyndie Chime, OTR/L Occupational Therapist [phone] (pager)  05/26/2014, 2:30 PM

## 2014-05-26 NOTE — Progress Notes (Signed)
Physical Therapy Treatment Patient Details Name: [name] [name] MRN: [id number] DOB: 06-11-1933 Today's Date: 05/26/2014    History of Present Illness pt presents with R TKA and hx of L TKA  in 1999.      PT Comments    Patient making good progress. Anticipate DC home sometime tomorrow based on mobility from this AM. Patient is highly motivated but does fatigue easily  Follow Up Recommendations  Home health PT;Supervision for mobility/OOB     Equipment Recommendations  Rolling walker with 5" wheels;3in1 (PT)    Recommendations for Other Services       Precautions / Restrictions Precautions Precautions: Fall Restrictions Weight Bearing Restrictions: Yes RLE Weight Bearing: Weight bearing as tolerated    Mobility  Bed Mobility Overal bed mobility: Needs Assistance Bed Mobility: Supine to Sit     Supine to sit: Min assist     General bed mobility comments: min A for R LE out of bed. Patient with correct technique  Transfers Overall transfer level: Needs assistance Equipment used: Rolling walker (2 wheeled)   Sit to Stand: Min assist         General transfer comment: Min A to ensure balance but much less assist than yesterday. Cues for hand placement  Ambulation/Gait Ambulation/Gait assistance: Min guard Ambulation Distance (Feet): 80 Feet Assistive device: Rolling walker (2 wheeled) Gait Pattern/deviations: Step-to pattern;Decreased step length - right;Decreased stance time - left     General Gait Details: Cues for upright posture and step length.    Stairs            Wheelchair Mobility    Modified Rankin (Stroke Patients Only)       Balance                                    Cognition Arousal/Alertness: Awake/alert Behavior During Therapy: WFL for tasks assessed/performed Overall Cognitive Status: Within Functional Limits for tasks assessed                      Exercises Total Joint Exercises Quad Sets:  AROM;Right;10 reps Heel Slides: AAROM;Right;10 reps Hip ABduction/ADduction: AAROM;Right;10 reps Straight Leg Raises: AAROM;Right;10 reps    General Comments        Pertinent Vitals/Pain Pain Score: 6  Pain Location: R knee Pain Descriptors / Indicators: Sore Pain Intervention(s): Monitored during session    Home Living                      Prior Function            PT Goals (current goals can now be found in the care plan section) Progress towards PT goals: Progressing toward goals    Frequency  7X/week    PT Plan Current plan remains appropriate    Co-evaluation             End of Session Equipment Utilized During Treatment: Gait belt Activity Tolerance: Patient tolerated treatment well Patient left: in chair;with call bell/phone within reach;with family/visitor present     Time: 1829-9371 PT Time Calculation (min): 27 min  Charges:  $Gait Training: 8-22 mins $Therapeutic Exercise: 8-22 mins                    G Codes:      Jacqualyn Posey 05/26/2014, 12:06 PM  05/26/2014 Jacqualyn Posey PTA [phone] pager [phone] office

## 2014-05-26 NOTE — Progress Notes (Signed)
ANTICOAGULATION CONSULT NOTE - Follow Up Consult  Pharmacy Consult for Warfarin Indication: VTE prophylaxis  Allergies  Allergen Reactions  . Anastrozole     REACTION: arthralgias  . Aspirin Other (See Comments)    Gi upset   . Ciprofloxacin Itching  . Letrozole     REACTION: leg pain    Patient Measurements: Height: 5\' 6"  (167.6 cm) Weight: 205 lb (92.987 kg) IBW/kg (Calculated) : 59.3  Vital Signs: Temp: 98.1 F (36.7 C) (09/25 1256) Temp src: Oral (09/25 1256) BP: 120/55 mmHg (09/25 1256) Pulse Rate: 87 (09/25 1256)  Labs:  Recent Labs  05/25/14 0645 05/26/14 0721  HGB 10.2* 9.3*  HCT 30.1* 28.1*  PLT 233 216  LABPROT 14.8 22.0*  INR 1.16 1.92*  CREATININE 0.80  --     Estimated Creatinine Clearance: 65.5 ml/min (by C-G formula based on Cr of 0.8).  Assessment: Patient is a 62 YOF on Coumadin for VTE px s/p right TKA. Pt has received 2 doses of 7.5 mg with INR increased sharply from 1.16 to 1.92 today.  H/H decreased slightly and plt wnl. No bleeding documented.   Goal of Therapy:  INR 2-3   Plan:  1) Coumadin 2 mg POx1 today   Ginny Forth 05/26/2014,1:01 PM

## 2014-05-27 LAB — CBC WITH DIFFERENTIAL/PLATELET
BASOS PCT: 0 % (ref 0–1)
Basophils Absolute: 0 10*3/uL (ref 0.0–0.1)
EOS PCT: 2 % (ref 0–5)
Eosinophils Absolute: 0.1 10*3/uL (ref 0.0–0.7)
HCT: 27.4 % — ABNORMAL LOW (ref 36.0–46.0)
Hemoglobin: 9.2 g/dL — ABNORMAL LOW (ref 12.0–15.0)
Lymphocytes Relative: 27 % (ref 12–46)
Lymphs Abs: 1.9 10*3/uL (ref 0.7–4.0)
MCH: 28.9 pg (ref 26.0–34.0)
MCHC: 33.6 g/dL (ref 30.0–36.0)
MCV: 86.2 fL (ref 78.0–100.0)
MONOS PCT: 15 % — AB (ref 3–12)
Monocytes Absolute: 1 10*3/uL (ref 0.1–1.0)
NEUTROS PCT: 56 % (ref 43–77)
Neutro Abs: 4 10*3/uL (ref 1.7–7.7)
Platelets: 204 10*3/uL (ref 150–400)
RBC: 3.18 MIL/uL — ABNORMAL LOW (ref 3.87–5.11)
RDW: 14.6 % (ref 11.5–15.5)
WBC: 7.1 10*3/uL (ref 4.0–10.5)

## 2014-05-27 LAB — PROTIME-INR
INR: 1.67 — ABNORMAL HIGH (ref 0.00–1.49)
PROTHROMBIN TIME: 19.7 s — AB (ref 11.6–15.2)

## 2014-05-27 NOTE — Progress Notes (Signed)
Dressing c/d/i Exam stable Plan to dc home today after PT eval Rx in chart

## 2014-05-27 NOTE — Progress Notes (Signed)
Patient ready for discharge. Waiting on son to get here, as will be ride home.

## 2014-05-27 NOTE — Progress Notes (Signed)
Physical Therapy Treatment Patient Details Name: [name] [name] MRN: [id number] DOB: September 19, 1932 Today's Date: 05/27/2014    History of Present Illness pt presents with R TKA and hx of L TKA  in 1999.      PT Comments    Pt progressing with mobility.  Ambulated ~75' & completed stair training this session.  Pt eager to d/c home.  Reports she will have 24/7 assist.  Patient safe to D/C from a mobility standpoint based on progression towards goals set on PT eval.   Follow Up Recommendations  Home health PT;Supervision for mobility/OOB     Equipment Recommendations  Rolling walker with 5" wheels;3in1 (PT)    Recommendations for Other Services       Precautions / Restrictions Precautions Precautions: Fall Restrictions RLE Weight Bearing: Weight bearing as tolerated    Mobility  Bed Mobility Overal bed mobility: Needs Assistance Bed Mobility: Supine to Sit     Supine to sit: Min assist     General bed mobility comments: (A) for RLE  Transfers Overall transfer level: Needs assistance Equipment used: Rolling walker (2 wheeled) Transfers: Sit to/from Stand Sit to Stand: Min guard;Min assist         General transfer comment: Min (A) to achieve standing from bed & Min guard to stand from toilet with use of grab bar.    Ambulation/Gait Ambulation/Gait assistance: Min guard Ambulation Distance (Feet): 75 Feet Assistive device: Rolling walker (2 wheeled) Gait Pattern/deviations: Step-to pattern;Decreased step length - left;Decreased weight shift to right Gait velocity: decreased   General Gait Details: cues for increased WBing RLE, tall posture, look ahead, & to keep walker on floor rather than picking it up.     Stairs Stairs: Yes Stairs assistance: Min guard Stair Management: No rails;Step to pattern;Forwards;With walker Number of Stairs: 1 (2x's) General stair comments: cues for technique  Wheelchair Mobility    Modified Rankin (Stroke Patients Only)        Balance                                    Cognition Arousal/Alertness: Awake/alert Behavior During Therapy: WFL for tasks assessed/performed Overall Cognitive Status: Within Functional Limits for tasks assessed                      Exercises      General Comments        Pertinent Vitals/Pain Pain Assessment: 0-10 Pain Score: 5  Pain Location: rt knee Pain Descriptors / Indicators: Sore;Discomfort Pain Intervention(s): Monitored during session;Premedicated before session;Repositioned    Home Living                      Prior Function            PT Goals (current goals can now be found in the care plan section) Acute Rehab PT Goals PT Goal Formulation: With patient Time For Goal Achievement: 05/31/14 Potential to Achieve Goals: Good Progress towards PT goals: Progressing toward goals    Frequency  7X/week    PT Plan Current plan remains appropriate    Co-evaluation             End of Session Equipment Utilized During Treatment: Gait belt Activity Tolerance: Patient tolerated treatment well Patient left: in chair;with call bell/phone within reach     Time: 0942-1007 PT Time Calculation (min): 25 min  Charges:  $  Gait Training: 8-22 mins $Therapeutic Activity: 8-22 mins                    G Codes:      Sena Hitch 05/27/2014, 1:45 PM  Sarajane Marek, PTA [phone] 05/27/2014

## 2014-05-29 NOTE — Discharge Summary (Signed)
  Final diagnosis osteoarthritis right knee. Surgical procedure right total knee arthroplasty. Discharge to home in stable condition. Home health physical therapy. Followup in the office in 2 weeks.

## 2014-06-01 ENCOUNTER — Ambulatory Visit: Payer: Medicare Other | Admitting: Nurse Practitioner

## 2014-06-01 ENCOUNTER — Encounter: Payer: Self-pay | Admitting: Gastroenterology

## 2014-06-06 ENCOUNTER — Other Ambulatory Visit: Payer: Self-pay | Admitting: Internal Medicine

## 2014-06-15 ENCOUNTER — Ambulatory Visit: Payer: Medicare Other | Admitting: Internal Medicine

## 2014-06-27 ENCOUNTER — Ambulatory Visit: Payer: Medicare Other | Attending: Orthopedic Surgery | Admitting: Physical Therapy

## 2014-06-27 DIAGNOSIS — I10 Essential (primary) hypertension: Secondary | ICD-10-CM | POA: Diagnosis not present

## 2014-06-27 DIAGNOSIS — Z5189 Encounter for other specified aftercare: Secondary | ICD-10-CM | POA: Insufficient documentation

## 2014-06-27 DIAGNOSIS — M1711 Unilateral primary osteoarthritis, right knee: Secondary | ICD-10-CM | POA: Insufficient documentation

## 2014-06-27 DIAGNOSIS — Z96651 Presence of right artificial knee joint: Secondary | ICD-10-CM | POA: Insufficient documentation

## 2014-07-11 NOTE — Discharge Summary (Signed)
Physician Discharge Summary  Patient ID: [name] [name] MRN: [id number] DOB/AGE: 10-06-1932 80 y.o.  Admit date: 05/24/2014 Discharge date: 07/11/2014  Admission Diagnoses:osteoarthritis right knee  Discharge Diagnoses:  Active Problems:   Total knee replacement status   Discharged Condition: stable  Hospital Course: patient's hospital course was essentially unremarkable. She underwent total knee arthroplasty and was discharged to home.  Consults: None  Significant Diagnostic Studies: labs: routine labs  Treatments: surgery: see operative note  Discharge Exam: Blood pressure 137/59, pulse 73, temperature 98 F (36.7 C), temperature source Oral, resp. rate 16, height 5\' 6"  (1.676 m), weight 92.987 kg (205 lb), SpO2 91 %. Incision/Wound:incision clean and dry  Disposition: 06-Home-Health Care Svc  Discharge Instructions    Call MD / Call 911    Complete by:  As directed   If you experience chest pain or shortness of breath, CALL 911 and be transported to the hospital emergency room.  If you develope a fever above 101 F, pus (white drainage) or increased drainage or redness at the wound, or calf pain, call your surgeon's office.     Call MD / Call 911    Complete by:  As directed   If you experience chest pain or shortness of breath, CALL 911 and be transported to the hospital emergency room.  If you develope a fever above 101.5 F, pus (white drainage) or increased drainage or redness at the wound, or calf pain, call your surgeon's office.     Constipation Prevention    Complete by:  As directed   Drink plenty of fluids.  Prune juice may be helpful.  You may use a stool softener, such as Colace (over the counter) 100 mg twice a day.  Use MiraLax (over the counter) for constipation as needed.     Constipation Prevention    Complete by:  As directed   Drink plenty of fluids.  Prune juice may be helpful.  You may use a stool softener, such as Colace (over the counter) 100 mg twice  a day.  Use MiraLax (over the counter) for constipation as needed.     Diet - low sodium heart healthy    Complete by:  As directed      Diet - low sodium heart healthy    Complete by:  As directed      Diet general    Complete by:  As directed      Do not put a pillow under the knee. Place it under the heel.    Complete by:  As directed      Driving restrictions    Complete by:  As directed   No driving while taking narcotic pain meds.     Increase activity slowly as tolerated    Complete by:  As directed      Increase activity slowly as tolerated    Complete by:  As directed      TED hose    Complete by:  As directed   Use stockings (TED hose) for 6 weeks on both leg(s).  You may remove them at night for sleeping.     Weight bearing as tolerated    Complete by:  As directed             Medication List    TAKE these medications        acetaminophen 500 MG tablet  Commonly known as:  TYLENOL  Take 1,000 mg by mouth every morning.     amLODipine  10 MG tablet  Commonly known as:  NORVASC  Take 10 mg by mouth daily.     diphenhydrAMINE 25 MG tablet  Commonly known as:  SOMINEX  Take 25 mg by mouth at bedtime as needed for sleep.     losartan 100 MG tablet  Commonly known as:  COZAAR  Take 100 mg by mouth daily.     methocarbamol 500 MG tablet  Commonly known as:  ROBAXIN  Take 1 tablet (500 mg total) by mouth 3 (three) times daily.     multivitamin with minerals Tabs tablet  Take 1 tablet by mouth daily.     oxyCODONE-acetaminophen 5-325 MG per tablet  Commonly known as:  ROXICET  Take 1 tablet by mouth every 4 (four) hours as needed for severe pain.     potassium chloride SA 20 MEQ tablet  Commonly known as:  K-DUR,KLOR-CON  Take 20 mEq by mouth 2 (two) times daily.     triamcinolone cream 0.5 %  Commonly known as:  KENALOG  Apply topically 2 (two) times daily.     warfarin 1 MG tablet  Commonly known as:  COUMADIN  Take 1 tablet (1 mg total) by mouth  daily.           Follow-up Information    Follow up with [facility].   Why:  Someone from [facility] will contact you concerning start date and time for physical therapy.   Contact information:   [address] [place] [address] [phone]       Follow up with DUDA,MARCUS V, MD In 2 weeks.   Specialty:  Orthopedic Surgery   Contact information:   [place] [address] [phone]       Signed: Newt Minion 07/11/2014, 6:36 PM

## 2014-07-17 ENCOUNTER — Ambulatory Visit: Payer: Medicare Other | Attending: Orthopedic Surgery

## 2014-07-17 DIAGNOSIS — I10 Essential (primary) hypertension: Secondary | ICD-10-CM | POA: Diagnosis not present

## 2014-07-17 DIAGNOSIS — Z96651 Presence of right artificial knee joint: Secondary | ICD-10-CM | POA: Diagnosis not present

## 2014-07-17 DIAGNOSIS — M1711 Unilateral primary osteoarthritis, right knee: Secondary | ICD-10-CM | POA: Insufficient documentation

## 2014-07-17 DIAGNOSIS — Z5189 Encounter for other specified aftercare: Secondary | ICD-10-CM | POA: Diagnosis present

## 2014-07-17 DIAGNOSIS — M25561 Pain in right knee: Secondary | ICD-10-CM

## 2014-07-17 DIAGNOSIS — R262 Difficulty in walking, not elsewhere classified: Secondary | ICD-10-CM

## 2014-07-17 DIAGNOSIS — R29898 Other symptoms and signs involving the musculoskeletal system: Secondary | ICD-10-CM

## 2014-07-17 DIAGNOSIS — M25661 Stiffness of right knee, not elsewhere classified: Secondary | ICD-10-CM

## 2014-07-17 NOTE — Therapy (Signed)
Physical Therapy Treatment  Patient Details  Name: [name] [name] MRN: [id number] Date of Birth: [birth year]  Encounter Date: 07/17/2014    Past Medical History  Diagnosis Date  . Anemia     iron deficiency  . Diverticulosis of colon 2004    Dr Deatra Ina  . Hyperlipidemia   . Hypertension   . Vitamin D deficiency   . Vitamin B12 deficiency   . Hematuria     Dr Terance Hart  . Depression 2009    grief  . Osteoarthritis, knee   . Cancer     breast, skin  . GERD (gastroesophageal reflux disease)   . Heart murmur   . Acute lymphadenitis of arm     Past Surgical History  Procedure Laterality Date  . Total knee arthroplasty  06-04-98  . Mastectomy  09-13-08    left and right  . L groin skin cancer  2011  . Joint replacement    . Abdominal hysterectomy    . Total knee arthroplasty Right 05/24/2014    Procedure: RIGHT TOTAL KNEE ARTHROPLASTY;  Surgeon: Newt Minion, MD;  Location: [place];  Service: Orthopedics;  Laterality: Right;    There were no vitals taken for this visit.  Visit Diagnosis:  Stiffness of knee joint, right  Pain in joint, lower leg, right  Difficulty in walking  Weakness of both legs      Subjective Assessment - 07/17/14 1316    Symptoms RT leg tightness , no pain generally.  She does HEP 2x/day.     Pertinent History She reports chronic knee pain so she decided to have replacement   Limitations House hold activities  Not driving, not doin gheavy claning n home or out to ahng clothes   How long can you sit comfortably? as long as needed   How long can you stand comfortably? 60 minutes   How long can you walk comfortably? 8 blocks , 4 out and 4 back   Patient Stated Goals Ease stiffness improve cactivity in home, return to driving   Currently in Pain? No/denies   Multiple Pain Sites No          [facility] PT Assessment - 07/17/14 1332    AROM   Overall AROM  --  12-108   PROM   Overall PROM  --  5-120          [facility] Adult PT  Treatment/Exercise - 07/17/14 1332    Ambulation/Gait   Ambulation/Gait Yes   Ambulation/Gait Assistance 6: Modified independent (Device/Increase time)  SPC   Assistive device Straight cane   Gait Pattern Within Functional Limits   Stairs Yes   Stairs Assistance 6: Modified independent (Device/Increase time)   Stair Management Technique One rail Right;With cane;Step to pattern  but was able to alternate when asked   Number of Stairs 8   Height of Stairs --  6 inch   Door Management 6: Modified independent (Device/Increase time)   Knee/Hip Exercises: Aerobic   Stationary Bike 5 min no load   Knee/Hip Exercises: Seated   Other Seated Knee Exercises sit to stand with 4 inche pad x12   Knee/Hip Exercises: Supine   Quad Sets Right;1 set;15 reps   Short Arc Quad Sets Right;1 set;20 reps  4 pounds, 3 sec hold   Bridges Both;1 set;15 reps   Straight Leg Raises Right;1 set;10 reps   Knee/Hip Exercises: Sidelying   Hip ABduction Strengthening;Right;1 set  12 reps   Knee/Hip Exercises: Prone   Hamstring  Curl 1 set;20 reps  RT 4 pounds   Prone Knee Hang 2 minutes  4 pounds    Recumbent bike 5 minutes no load for range      PT Education - 07/17/14 1401    Education provided No          PT Short Term Goals - 07/17/14 1349    PT SHORT TERM GOAL #1   Title She is independent with initial HEP   Time 4   Period Weeks   Status On-going   PT SHORT TERM GOAL #2   Title RT active range to 108 degrees to assist getting in and out of car   Baseline 108 today   Time 4   Period Weeks   Status Achieved   PT SHORT TERM GOAL #3   Title gait with cane household distances with SPC    Time 4   Period Weeks   Status Achieved   PT SHORT TERM GOAL #4   Title RT knee active extension to -4 degrees for safe gait  and prep return to driving   Time 4   Period Weeks   Status On-going          PT Long Term Goals - 07/17/14 1352    PT LONG TERM GOAL #1   Title She will be independent  in advanced HEP   Time 8   Period Weeks   Status On-going   PT LONG TERM GOAL #2   Title RT knee active flexion to 115 degrees allow safer acess with stairs in daughter home   Time 8   Period Weeks   Status On-going   PT LONG TERM GOAL #3   Title RT quad and hamstring strength to 4-/5 to improve rising from chair with min to no use of UE assist.    Time 8   Period Weeks   Status On-going   PT LONG TERM GOAL #4   Title Gait community distances for shopping grocersis and yard [employer].    Time 8   Period Weeks   Status On-going   PT LONG TERM GOAL #5   Title Gait speed at least .m/sec for community walking   Time 8   Period Weeks   Status On-going          Problem List Patient Active Problem List   Diagnosis Date Noted  . Total knee replacement status 05/24/2014  . Lymphedema of upper extremity following lymphadenectomy 05/22/2014  . Preop exam for internal medicine 05/22/2014  . Breast cancer 03/17/2014  . Chronic cholecystitis 11/15/2013  . Abnormal gallbladder x-ray 11/08/2013  . Abdominal tenderness of left lower quadrant 11/02/2013  . Diverticulitis of colon without hemorrhage 11/02/2013  . Hematuria 11/02/2013  . Hypokalemia 11/02/2013  . Obstructive chronic bronchitis with exacerbation 09/05/2013  . Fever, unspecified 08/26/2013  . Bilateral breast cancer 05/29/2013  . Dyslipidemia 04/26/2013  . Right elbow pain 02/12/2013  . Mass of right forearm 02/07/2013  . Well adult exam 02/04/2013  . Lipoma of forearm 02/04/2013  . Hematochezia 10/19/2012  . Mass of left side of neck 01/06/2012  . Diverticulitis 10/10/2011  . Nausea & vomiting 04/16/2011  . Chills 04/16/2011  . LLQ abdominal pain 04/16/2011  . Abdominal pain, chronic, epigastric 08/14/2010  . GERD 06/27/2010  . SKIN CANCER, HX OF 01/31/2010  . SINUSITIS, ACUTE 09/03/2009  . CHILLS WITHOUT FEVER 05/04/2009  . Rash and other nonspecific skin eruption 11/21/2008  . HERPES ZOSTER 10/04/2008  .  FATIGUE 10/04/2008  . PARESTHESIA 10/04/2008  . GRIEF REACTION 08/15/2008  . DEPRESSION 08/15/2008  . OSTEOARTHRITIS 08/15/2008  . HYPOKALEMIA 12/22/2007  . WEIGHT LOSS, ABNORMAL 10/21/2007  . Pain in Soft Tissues of Limb 06/21/2007  . CA IN SITU, BREAST 03/27/2007  . B12 DEFICIENCY 03/27/2007  . DEFICIENCY, VITAMIN D NOS 03/27/2007  . HYPERLIPIDEMIA 03/27/2007  . ANEMIA-IRON DEFICIENCY 03/27/2007  . INSOMNIA, PERSISTENT 03/27/2007  . HYPERTENSION 03/27/2007  . BRONCHITIS, ACUTE 03/27/2007  . DIVERTICULOSIS, COLON 03/27/2007                                              Darrel Hoover  PT 07/17/2014, 2:01 PM

## 2014-07-19 ENCOUNTER — Ambulatory Visit: Payer: Medicare Other | Admitting: Physical Therapy

## 2014-07-19 ENCOUNTER — Telehealth: Payer: Self-pay | Admitting: Nurse Practitioner

## 2014-07-19 DIAGNOSIS — Z5189 Encounter for other specified aftercare: Secondary | ICD-10-CM | POA: Diagnosis not present

## 2014-07-19 DIAGNOSIS — M25661 Stiffness of right knee, not elsewhere classified: Secondary | ICD-10-CM

## 2014-07-19 NOTE — Telephone Encounter (Signed)
pt cld in left vm to sch appt-cld & spoke to pt gave pt time & date-pt understood

## 2014-07-19 NOTE — Therapy (Signed)
Physical Therapy Treatment  Patient Details  Name: [name] [name] MRN: [id number] Date of Birth: [birth year]  Encounter Date: 07/19/2014      PT End of Session - 07/19/14 1544    Visit Number 3   Number of Visits 16   Date for PT Re-Evaluation 08/27/14      Past Medical History  Diagnosis Date  . Anemia     iron deficiency  . Diverticulosis of colon 2004    Dr Deatra Ina  . Hyperlipidemia   . Hypertension   . Vitamin D deficiency   . Vitamin B12 deficiency   . Hematuria     Dr Terance Hart  . Depression 2009    grief  . Osteoarthritis, knee   . Cancer     breast, skin  . GERD (gastroesophageal reflux disease)   . Heart murmur   . Acute lymphadenitis of arm     Past Surgical History  Procedure Laterality Date  . Total knee arthroplasty  06-04-98  . Mastectomy  09-13-08    left and right  . L groin skin cancer  2011  . Joint replacement    . Abdominal hysterectomy    . Total knee arthroplasty Right 05/24/2014    Procedure: RIGHT TOTAL KNEE ARTHROPLASTY;  Surgeon: Newt Minion, MD;  Location: [place];  Service: Orthopedics;  Laterality: Right;    There were no vitals taken for this visit.  Visit Diagnosis:  Stiffness of knee joint, right      Subjective Assessment - 07/19/14 1336    Symptoms No pain, tight at times.  Uses cane 100% of time.  No longer uses high seat for commode.  Wants to work on the swelling today.   Currently in Pain? No/denies   Multiple Pain Sites No            [facility] Adult PT Treatment/Exercise - 07/19/14 1338    Knee/Hip Exercises: Stretches   Gastroc Stretch 3 reps;20 seconds  both on step, stand by assist   Knee/Hip Exercises: Aerobic   Stationary Bike 3 1/2 minutes   Knee/Hip Exercises: Machines for Strengthening   Cybex Knee Flexion 45 lbs , 20 reps   Knee/Hip Exercises: Standing   Forward Step Up 10 reps;Right   Wall Squat 10 reps  contact guarding for safety   Knee/Hip Exercises: Seated   Other Seated Knee Exercises sit to  stand with 4 inche pad x10   Manual Therapy   Manual Therapy Edema management  retrograge soft tissue and taping for edema control   Other Manual Therapy taping for quad activation                Problem List Patient Active Problem List   Diagnosis Date Noted  . Total knee replacement status 05/24/2014  . Lymphedema of upper extremity following lymphadenectomy 05/22/2014  . Preop exam for internal medicine 05/22/2014  . Breast cancer 03/17/2014  . Chronic cholecystitis 11/15/2013  . Abnormal gallbladder x-ray 11/08/2013  . Abdominal tenderness of left lower quadrant 11/02/2013  . Diverticulitis of colon without hemorrhage 11/02/2013  . Hematuria 11/02/2013  . Hypokalemia 11/02/2013  . Obstructive chronic bronchitis with exacerbation 09/05/2013  . Fever, unspecified 08/26/2013  . Bilateral breast cancer 05/29/2013  . Dyslipidemia 04/26/2013  . Right elbow pain 02/12/2013  . Mass of right forearm 02/07/2013  . Well adult exam 02/04/2013  . Lipoma of forearm 02/04/2013  . Hematochezia 10/19/2012  . Mass of left side of neck 01/06/2012  . Diverticulitis 10/10/2011  .  Nausea & vomiting 04/16/2011  . Chills 04/16/2011  . LLQ abdominal pain 04/16/2011  . Abdominal pain, chronic, epigastric 08/14/2010  . GERD 06/27/2010  . SKIN CANCER, HX OF 01/31/2010  . SINUSITIS, ACUTE 09/03/2009  . CHILLS WITHOUT FEVER 05/04/2009  . Rash and other nonspecific skin eruption 11/21/2008  . HERPES ZOSTER 10/04/2008  . FATIGUE 10/04/2008  . PARESTHESIA 10/04/2008  . GRIEF REACTION 08/15/2008  . DEPRESSION 08/15/2008  . OSTEOARTHRITIS 08/15/2008  . HYPOKALEMIA 12/22/2007  . WEIGHT LOSS, ABNORMAL 10/21/2007  . Pain in Soft Tissues of Limb 06/21/2007  . CA IN SITU, BREAST 03/27/2007  . B12 DEFICIENCY 03/27/2007  . DEFICIENCY, VITAMIN D NOS 03/27/2007  . HYPERLIPIDEMIA 03/27/2007  . ANEMIA-IRON DEFICIENCY 03/27/2007  . INSOMNIA, PERSISTENT 03/27/2007  . HYPERTENSION 03/27/2007  .  BRONCHITIS, ACUTE 03/27/2007  . DIVERTICULOSIS, COLON 03/27/2007                                              [name] PTA 07/19/2014, 3:48 PM

## 2014-07-24 ENCOUNTER — Encounter: Payer: Medicare Other | Admitting: Physical Therapy

## 2014-07-31 ENCOUNTER — Ambulatory Visit: Payer: Medicare Other | Admitting: Physical Therapy

## 2014-07-31 DIAGNOSIS — M25561 Pain in right knee: Secondary | ICD-10-CM

## 2014-07-31 DIAGNOSIS — M25661 Stiffness of right knee, not elsewhere classified: Secondary | ICD-10-CM

## 2014-07-31 DIAGNOSIS — R29898 Other symptoms and signs involving the musculoskeletal system: Secondary | ICD-10-CM

## 2014-07-31 DIAGNOSIS — Z5189 Encounter for other specified aftercare: Secondary | ICD-10-CM | POA: Diagnosis not present

## 2014-07-31 NOTE — Therapy (Signed)
Physical Therapy Treatment  Patient Details  Name: [name] [name] MRN: [id number] Date of Birth: 1933-07-09  Encounter Date: 07/31/2014      PT End of Session - 07/31/14 1611    Visit Number 4   Number of Visits 16   Date for PT Re-Evaluation 08/27/14   PT Start Time 2536   PT Stop Time 1600   PT Time Calculation (min) 46 min   Activity Tolerance Patient tolerated treatment well      Past Medical History  Diagnosis Date  . Anemia     iron deficiency  . Diverticulosis of colon 2004    Dr Deatra Ina  . Hyperlipidemia   . Hypertension   . Vitamin D deficiency   . Vitamin B12 deficiency   . Hematuria     Dr Terance Hart  . Depression 2009    grief  . Osteoarthritis, knee   . Cancer     breast, skin  . GERD (gastroesophageal reflux disease)   . Heart murmur   . Acute lymphadenitis of arm     Past Surgical History  Procedure Laterality Date  . Total knee arthroplasty  06-04-98  . Mastectomy  09-13-08    left and right  . L groin skin cancer  2011  . Joint replacement    . Abdominal hysterectomy    . Total knee arthroplasty Right 05/24/2014    Procedure: RIGHT TOTAL KNEE ARTHROPLASTY;  Surgeon: Newt Minion, MD;  Location: [place];  Service: Orthopedics;  Laterality: Right;    There were no vitals taken for this visit.  Visit Diagnosis:  Weakness of both legs  Stiffness of knee joint, right  Pain in joint, lower leg, right      Subjective Assessment - 07/31/14 1556    Symptoms tight Knee.  5/10 pain this morning.  Uses cane intermittantly inside.  Uses cane 100% outside.  Not yoe driving.  She hopes this will be her last day of PT.   Currently in Pain? No/denies            [facility] Adult PT Treatment/Exercise - 07/31/14 1512    Knee/Hip Exercises: Stretches   Active Hamstring Stretch 3 reps;30 seconds   Active Hamstring Stretch Limitations -10 degrees estension Rt knee   Quad Stretch 5 reps;10 seconds   Quad Stretch Limitations 110 degrees flexion Rt knee   Knee/Hip Exercises: Machines for Strengthening   Total Gym Leg Press 20 lbs. leg press, two legs 20 reps.  stand by assist for safety and technique   Knee/Hip Exercises: Standing   Heel Raises 20 reps  Unable to do single leg yet.  Encourages weight shifts Rt th   Lateral Step Up 10 reps  4 inch step    Forward Step Up 10 reps;2 sets  uses hand for assist less than last visit. 6 inches   Wall Squat 15 reps  Hip hinges, instructions needed initially   Stairs 12 steps in a row intermittantly step over step ascending and descending.   SLS 3 seconds left, best   Gait Training on level and steps.  Less limp on level   Knee/Hip Exercises: Seated   Long Arc Quad Weight --  4 LBS. 10 reps   Heel Slides AROM   Heel Slides Limitations 10   Other Seated Knee Exercises Car driving simulation step from gas to brake.10 reps   Knee/Hip Exercises: Supine   Heel Slides 1 set;Right;AAROM   Heel Slides Limitations 110 degrees flexion   Knee  Extension Limitations -10 degrees knee extension Rt knee   Manual Therapy   Manual Therapy Edema management  Kinesiotex taping for edema   Other Manual Therapy Kinesiotex taping to activate quads and inhibit anterior lower leg.            PT Short Term Goals - 07/31/14 1617    PT SHORT TERM GOAL #1   Title She is independent with initial HEP   Time 4   Period Weeks   Status Achieved   PT SHORT TERM GOAL #2   Title RT active range to 108 degrees to assist getting in and out of car   Time 4   Period Weeks   Status Achieved   PT SHORT TERM GOAL #4   Title RT knee active extension to -4 degrees for safe gait  and prep return to driving   Time 4   Period Weeks   Status On-going          PT Long Term Goals - 07/31/14 1618    PT LONG TERM GOAL #1   Title She will be independent in advanced HEP   Time 8   Period Weeks   Status On-going   PT LONG TERM GOAL #2   Title RT knee active flexion to 115 degrees allow safer acess with stairs in daughter  home   Time 8   Status On-going   PT LONG TERM GOAL #3   Title RT quad and hamstring strength to 4-/5 to improve rising from chair with min to no use of UE assist.    Time 8   Period Weeks   Status Achieved   PT LONG TERM GOAL #4   Title Gait community distances for shopping grocersis and yard sales.    Time 8   Period Weeks   Status On-going   PT LONG TERM GOAL #5   Title Gait speed at least .m/sec for community walking   Time 8   Period Weeks   Status On-going          Plan - 07/31/14 1613    Clinical Impression Statement Strength 4/5 quads Rt, hamstrings 5/5 Rt.  Reports being able to use her front steps (4) using her cane (no rails)  Wants to be done with PT so she can drive and take care of her brother who is getting knee surgery in two weeks.  Patient has a MD appointment this week.   PT Next Visit Plan See what the MD says about continued PT.  The P.O.C. is through12/27/2015.   Consulted and Agree with Plan of Care Patient        Problem List Patient Active Problem List   Diagnosis Date Noted  . Total knee replacement status 05/24/2014  . Lymphedema of upper extremity following lymphadenectomy 05/22/2014  . Preop exam for internal medicine 05/22/2014  . Breast cancer 03/17/2014  . Chronic cholecystitis 11/15/2013  . Abnormal gallbladder x-ray 11/08/2013  . Abdominal tenderness of left lower quadrant 11/02/2013  . Diverticulitis of colon without hemorrhage 11/02/2013  . Hematuria 11/02/2013  . Hypokalemia 11/02/2013  . Obstructive chronic bronchitis with exacerbation 09/05/2013  . Fever, unspecified 08/26/2013  . Bilateral breast cancer 05/29/2013  . Dyslipidemia 04/26/2013  . Right elbow pain 02/12/2013  . Mass of right forearm 02/07/2013  . Well adult exam 02/04/2013  . Lipoma of forearm 02/04/2013  . Hematochezia 10/19/2012  . Mass of left side of neck 01/06/2012  . Diverticulitis 10/10/2011  . Nausea & vomiting  04/16/2011  . Chills 04/16/2011  . LLQ  abdominal pain 04/16/2011  . Abdominal pain, chronic, epigastric 08/14/2010  . GERD 06/27/2010  . SKIN CANCER, HX OF 01/31/2010  . SINUSITIS, ACUTE 09/03/2009  . CHILLS WITHOUT FEVER 05/04/2009  . Rash and other nonspecific skin eruption 11/21/2008  . HERPES ZOSTER 10/04/2008  . FATIGUE 10/04/2008  . PARESTHESIA 10/04/2008  . GRIEF REACTION 08/15/2008  . DEPRESSION 08/15/2008  . OSTEOARTHRITIS 08/15/2008  . HYPOKALEMIA 12/22/2007  . WEIGHT LOSS, ABNORMAL 10/21/2007  . Pain in Soft Tissues of Limb 06/21/2007  . CA IN SITU, BREAST 03/27/2007  . B12 DEFICIENCY 03/27/2007  . DEFICIENCY, VITAMIN D NOS 03/27/2007  . HYPERLIPIDEMIA 03/27/2007  . ANEMIA-IRON DEFICIENCY 03/27/2007  . INSOMNIA, PERSISTENT 03/27/2007  . HYPERTENSION 03/27/2007  . BRONCHITIS, ACUTE 03/27/2007  . DIVERTICULOSIS, COLON 03/27/2007                                              Melvenia Needles, PTA 07/31/2014 4:23 PM Phone: [phone] Fax: [phone]   07/31/2014, 4:23 PM

## 2014-08-02 ENCOUNTER — Encounter: Payer: Medicare Other | Admitting: Physical Therapy

## 2014-08-18 ENCOUNTER — Other Ambulatory Visit: Payer: Self-pay | Admitting: *Deleted

## 2014-08-18 DIAGNOSIS — C50919 Malignant neoplasm of unspecified site of unspecified female breast: Secondary | ICD-10-CM

## 2014-08-21 ENCOUNTER — Ambulatory Visit (HOSPITAL_BASED_OUTPATIENT_CLINIC_OR_DEPARTMENT_OTHER): Payer: Medicare Other | Admitting: Nurse Practitioner

## 2014-08-21 ENCOUNTER — Telehealth: Payer: Self-pay | Admitting: *Deleted

## 2014-08-21 ENCOUNTER — Telehealth: Payer: Self-pay | Admitting: Nurse Practitioner

## 2014-08-21 ENCOUNTER — Encounter: Payer: Self-pay | Admitting: Nurse Practitioner

## 2014-08-21 ENCOUNTER — Ambulatory Visit (HOSPITAL_BASED_OUTPATIENT_CLINIC_OR_DEPARTMENT_OTHER): Payer: Medicare Other | Admitting: Lab

## 2014-08-21 VITALS — BP 153/65 | HR 70 | Temp 98.2°F | Resp 18 | Ht 66.0 in | Wt 201.0 lb

## 2014-08-21 DIAGNOSIS — C50912 Malignant neoplasm of unspecified site of left female breast: Secondary | ICD-10-CM

## 2014-08-21 DIAGNOSIS — C50911 Malignant neoplasm of unspecified site of right female breast: Secondary | ICD-10-CM

## 2014-08-21 DIAGNOSIS — M8580 Other specified disorders of bone density and structure, unspecified site: Secondary | ICD-10-CM

## 2014-08-21 DIAGNOSIS — Z170 Estrogen receptor positive status [ER+]: Secondary | ICD-10-CM

## 2014-08-21 DIAGNOSIS — C50919 Malignant neoplasm of unspecified site of unspecified female breast: Secondary | ICD-10-CM

## 2014-08-21 LAB — CBC WITH DIFFERENTIAL/PLATELET
BASO%: 0.3 % (ref 0.0–2.0)
Basophils Absolute: 0 10*3/uL (ref 0.0–0.1)
EOS%: 2.5 % (ref 0.0–7.0)
Eosinophils Absolute: 0.1 10*3/uL (ref 0.0–0.5)
HCT: 37.1 % (ref 34.8–46.6)
HGB: 11.8 g/dL (ref 11.6–15.9)
LYMPH%: 50.2 % — ABNORMAL HIGH (ref 14.0–49.7)
MCH: 26.6 pg (ref 25.1–34.0)
MCHC: 31.7 g/dL (ref 31.5–36.0)
MCV: 84 fL (ref 79.5–101.0)
MONO#: 0.5 10*3/uL (ref 0.1–0.9)
MONO%: 9.7 % (ref 0.0–14.0)
NEUT#: 2.1 10*3/uL (ref 1.5–6.5)
NEUT%: 37.3 % — AB (ref 38.4–76.8)
PLATELETS: 278 10*3/uL (ref 145–400)
RBC: 4.41 10*6/uL (ref 3.70–5.45)
RDW: 14.5 % (ref 11.2–14.5)
WBC: 5.5 10*3/uL (ref 3.9–10.3)
lymph#: 2.8 10*3/uL (ref 0.9–3.3)

## 2014-08-21 LAB — COMPREHENSIVE METABOLIC PANEL (CC13)
ALBUMIN: 3.5 g/dL (ref 3.5–5.0)
ALT: 12 U/L (ref 0–55)
ANION GAP: 11 meq/L (ref 3–11)
AST: 14 U/L (ref 5–34)
Alkaline Phosphatase: 77 U/L (ref 40–150)
BUN: 11 mg/dL (ref 7.0–26.0)
CALCIUM: 9.2 mg/dL (ref 8.4–10.4)
CO2: 30 mEq/L — ABNORMAL HIGH (ref 22–29)
Chloride: 101 mEq/L (ref 98–109)
Creatinine: 0.8 mg/dL (ref 0.6–1.1)
EGFR: 77 mL/min/{1.73_m2} — AB (ref >90)
Glucose: 94 mg/dl (ref 70–140)
POTASSIUM: 2.7 meq/L — AB (ref 3.5–5.1)
Sodium: 142 mEq/L (ref 136–145)
TOTAL PROTEIN: 7.3 g/dL (ref 6.4–8.3)
Total Bilirubin: 0.3 mg/dL (ref 0.20–1.20)

## 2014-08-21 NOTE — Telephone Encounter (Signed)
per HF to add lab-cld & left pt message in re to time & date for lab appt

## 2014-08-21 NOTE — Progress Notes (Signed)
ID: [name] [name]   DOB: Jan 21, 1933  MR#: [id number]  CSN#:[id number]  PCP: Kirk Ruths MD GYN:  SU:  OTHER MD:  CHIEF COMPLAINT: hx bilateral breast cancers CURRENT TREATMENT: exemestane daily   INTERVAL HISTORY: [name] [name] returns today for follow up of her bilateral breast cancers. She has been on exemestane since March 2012 and is tolerating it well with no side effects that she is aware of. Specifically she denies arthralgias/myalgias, vaginal changes, or hot flashes. She is status post a right knee replacement surgery in September of this year and is recovering well. She is only on tylenol PRN pain.   REVIEW OF SYSTEMS: [name] [name] denies fevers, chills, nausea, vomiting, or changes in bowel or bladder habits. She her appetite and energy levels are decreased but manageable. She is eating regularly, just in small portions. She denies headaches, dizziness, unexplained weight loss, or night sweats. She denies shortness of breath, chest pain, cough, or palpitations. A detailed review of systems is otherwise negative.   PAST MEDICAL HISTORY: Past Medical History  Diagnosis Date  . Anemia     iron deficiency  . Diverticulosis of colon 2004    Dr Deatra Ina  . Hyperlipidemia   . Hypertension   . Vitamin D deficiency   . Vitamin B12 deficiency   . Hematuria     Dr Terance Hart  . Depression 2009    grief  . Osteoarthritis, knee   . Cancer     breast, skin  . GERD (gastroesophageal reflux disease)   . Heart murmur   . Acute lymphadenitis of arm     PAST SURGICAL HISTORY: Past Surgical History  Procedure Laterality Date  . Total knee arthroplasty  06-04-98  . Mastectomy  09-13-08    left and right  . L groin skin cancer  2011  . Joint replacement    . Abdominal hysterectomy    . Total knee arthroplasty Right 05/24/2014    Procedure: RIGHT TOTAL KNEE ARTHROPLASTY;  Surgeon: Newt Minion, MD;  Location: [place];  Service: Orthopedics;  Laterality: Right;    FAMILY HISTORY Family  History  Problem Relation Age of Onset  . Cancer Sister     breast  . Coronary artery disease Other   . Cancer Other     aunt, niece  . Cancer Maternal Aunt     breast   the patient's father died at the age of 3 from complications of diabetes. The patient's mother died at the age of 53 from heart disease. The patient had 3 brothers and 4 sisters. One sister was diagnosed with breast cancer at the age of 38. There is no history of ovarian cancer in the family.  GYNECOLOGIC HISTORY: She is GX P5, with first live birth age 20. She had her hysterectomy in 1963. She never used hormone replacement.  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 She was widowed in 2009, and lives by herself. 4 of her 5 children live in town. She attends [employer] locally.   ADVANCED DIRECTIVES: [name] [name] does not have advanced directives in place, but in case of an emergency she requests we call her daughter [name] [name] at [facility]: History  Substance Use Topics  . Smoking status: Never Smoker   . Smokeless tobacco: Not on file  . Alcohol Use: No     Colonoscopy: 2002  PAP: s/p hyst.  Bone density: May 2012/ nl  Lipid panel:  Allergies  Allergen Reactions  . Anastrozole  REACTION: arthralgias  . Aspirin Other (See Comments)    Gi upset   . Ciprofloxacin Itching  . Letrozole     REACTION: leg pain    Current Outpatient Prescriptions  Medication Sig Dispense Refill  . acetaminophen (TYLENOL) 500 MG tablet Take 1,000 mg by mouth every 6 (six) hours as needed.     Marland Kitchen amLODipine (NORVASC) 10 MG tablet Take 10 mg by mouth daily.    . diphenhydrAMINE (SOMINEX) 25 MG tablet Take 25 mg by mouth at bedtime as needed for sleep.    Marland Kitchen losartan (COZAAR) 100 MG tablet Take 100 mg by mouth daily.    . Multiple Vitamin (MULTIVITAMIN WITH MINERALS) TABS tablet Take 1 tablet by mouth daily.    . potassium chloride SA (K-DUR,KLOR-CON) 20 MEQ tablet Take 20 mEq by  mouth 2 (two) times daily.    Marland Kitchen triamcinolone cream (KENALOG) 0.5 % Apply topically 2 (two) times daily. (Patient not taking: Reported on 08/21/2014) 30 g 3   No current facility-administered medications for this visit.    OBJECTIVE: Elderly African American woman who appears stated age 80 Vitals:   08/21/14 1457  BP: 153/65  Pulse: 70  Temp: 98.2 F (36.8 C)  Resp: 18     Body mass index is 32.46 kg/(m^2).    ECOG FS: 1  Skin: warm, dry  HEENT: sclerae anicteric, conjunctivae pink, oropharynx clear. No thrush or mucositis.  Lymph Nodes: No cervical or supraclavicular lymphadenopathy  Lungs: clear to auscultation bilaterally, no rales, wheezes, or rhonci  Heart: regular rate and rhythm, mild systolic murmur appreciated Abdomen: round, soft, non tender, positive bowel sounds  Musculoskeletal: No focal spinal tenderness, no peripheral edema  Neuro: non focal, well oriented, positive affect  Breasts: bilateral breast status post mastectomies. No evidence of local recurrence. Bilateral axillae benign.   LAB RESULTS: Lab Results  Component Value Date   WBC 5.5 08/21/2014   NEUTROABS 2.1 08/21/2014   HGB 11.8 08/21/2014   HCT 37.1 08/21/2014   MCV 84.0 08/21/2014   PLT 278 08/21/2014      Chemistry      Component Value Date/Time   NA 135* 05/25/2014 0645   NA 141 05/12/2013 1321   K 4.1 05/25/2014 0645   K 3.7 05/12/2013 1321   CL 98 05/25/2014 0645   CL 102 05/13/2012 1021   CO2 28 05/25/2014 0645   CO2 31* 05/12/2013 1321   BUN 11 05/25/2014 0645   BUN 12.8 05/12/2013 1321   CREATININE 0.80 05/25/2014 0645   CREATININE 1.0 05/12/2013 1321      Component Value Date/Time   CALCIUM 9.0 05/25/2014 0645   CALCIUM 9.6 05/12/2013 1321   ALKPHOS 65 05/18/2014 0924   ALKPHOS 64 05/12/2013 1321   AST 16 05/18/2014 0924   AST 15 05/12/2013 1321   ALT 9 05/18/2014 0924   ALT 13 05/12/2013 1321   BILITOT 0.4 05/18/2014 0924   BILITOT 0.35 05/12/2013 1321        Lab Results  Component Value Date   LABCA2 29 05/13/2012    No components found for: ITGPQ982  No results for input(s): INR in the last 168 hours.  Urinalysis    Component Value Date/Time   COLORURINE YELLOW 11/02/2013 1412   APPEARANCEUR CLEAR 11/02/2013 1412   LABSPEC >=1.030* 11/02/2013 1412   PHURINE 6.0 11/02/2013 1412   GLUCOSEU NEGATIVE 11/02/2013 1412   HGBUR LARGE* 11/02/2013 1412   BILIRUBINUR NEGATIVE 11/02/2013 1412   KETONESUR NEGATIVE 11/02/2013 1412  UROBILINOGEN 0.2 11/02/2013 1412   NITRITE NEGATIVE 11/02/2013 1412   LEUKOCYTESUR NEGATIVE 11/02/2013 1412    STUDIES: No results found.  ASSESSMENT: 80 y.o. [place] woman with a history of:   1. Left breast cancer, status post modified radical mastectomy July 1997 for a 2-cm medullary tumor, treated neoadjuvantly with paclitaxel and doxorubicin, mastectomy revealing 5 out of 6 nodes involved, post mastectomy treatment including CMF (for this triple-negative lesion), followed by radiation, completed January 1998.    2. Status post right mastectomy January 2010 for a T1c N0, stage IA invasive ductal carcinoma, grade 2, strongly estrogen and progesterone receptor positive, HER2 negative, with an MIB-1 of 22%.  She tolerated letrozole and anastrozole poorly and she had a remote history of left lower extremity deep venous thrombosis associated with trauma, so we did not try tamoxifen.  She was started on exemestane March 2012 and continues on that medicine with good tolerance.      PLAN:  Quintella looks and feels well today. The labs were reviewed in detail and were entirely stable. The is no evidence of recurrent disease. She is tolerating the exemestane well and the plan is to continue it for 5 total years of anti-estrogen therapy.   She has not had a bone density scan this year, so I have placed orders for this to be done. At this time she is on a multivitamin with enhanced vitamin D levels. Her last bone  density scan in 2012 showed a t-score of 1.1.   Sabrina will return next December for labs and a follow up visit. She understands and agrees with this plan. She knows the goal of treatment in her case is cure. She has been encouraged to call with any issues that might arise before her next visit here.   Marcelino Duster    08/21/2014

## 2014-08-21 NOTE — Telephone Encounter (Signed)
per pof to sch pt appt-sch DEXA-gave pt appt time /date/location-gave pt copy of sch

## 2014-08-21 NOTE — Telephone Encounter (Signed)
Called pt to inform her of lab results concerning Potassium level(2.7L). No answer, but left a detailed message for pt to call this nurse back. I will continue to try to reach the pt until I speak directly to her. Message to be forwarded to [facility].

## 2014-08-22 ENCOUNTER — Ambulatory Visit: Payer: Medicare Other | Admitting: Internal Medicine

## 2014-08-22 ENCOUNTER — Telehealth: Payer: Self-pay | Admitting: *Deleted

## 2014-08-22 ENCOUNTER — Encounter: Payer: Medicare Other | Admitting: Internal Medicine

## 2014-08-22 MED ORDER — POTASSIUM CHLORIDE CRYS ER 20 MEQ PO TBCR: 40.0000 meq | EXTENDED_RELEASE_TABLET | Freq: Two times a day (BID) | ORAL | Status: DC

## 2014-08-22 NOTE — Telephone Encounter (Signed)
Called pt again to confirm that she did get my message about increasing the Potassium from 20 mEq BID to 40 mEq BID. Pt told me she did get my message and has already started. I called a refill of Potassium to her pharmacy. She will have a lab recheck on 08/30/14. Pt has been called with this appt. Pt verbalized understanding. No further concerns. Message to be forwarded to [facility].

## 2014-08-29 ENCOUNTER — Encounter: Payer: Self-pay | Admitting: Physical Therapy

## 2014-08-29 NOTE — Therapy (Signed)
[place], [place], [address] Phone: [phone]   Fax:  [phone]  Patient Details  Name: [name] MRN: [id number] Date of Birth: Nov 08, 1932  Encounter Date: 08/29/2014 PHYSICAL THERAPY DISCHARGE SUMMARY  Visits from Start of Care: 4  Current functional level related to goals / functional outcomes: PT LONG TERM GOAL #1    Title She will be independent in advanced HEP   Time 8   Period Weeks   Status Partially met   PT LONG TERM GOAL #2   Title RT knee active flexion to 115 degrees allow safer acess with stairs in daughter home   Time 8   Status Partially met 10-110 degrees   PT LONG TERM GOAL #3   Title RT quad and hamstring strength to 4-/5 to improve rising from chair with min to no use of UE assist.    Time 8   Period Weeks   Status Achieved   PT LONG TERM GOAL #4   Title Gait community distances for shopping grocersis and yard sales.    Time 8   Period Weeks   Status Partially met   PT LONG TERM GOAL #5   Title Gait speed at least .m/sec for community walking   Time 8   Period Weeks   Status Not assessed            Remaining deficits: On patient's last scheduled appt. On 07/31/14, she stated she wanted to be done with PT so she can drive and take care of her brother who is getting knee surgery in 2 weeks.  She planned to see her doctor to discuss.  The patient has not called to resume and chart has been inactive 1 month.  Will discharge from PT at this time with partial goals met.     Education / Equipment: Basic HEP Plan: Patient agrees to discharge.  Patient goals were partially met. Patient is being discharged due to the patient's request.  ?????      Alvera Singh 08/29/2014, 8:00 AM  [facility] [address] [place], [place], [address] Phone:  [phone]   Fax:  [phone]

## 2014-08-30 ENCOUNTER — Other Ambulatory Visit (HOSPITAL_BASED_OUTPATIENT_CLINIC_OR_DEPARTMENT_OTHER): Payer: Medicare Other

## 2014-08-30 DIAGNOSIS — M8580 Other specified disorders of bone density and structure, unspecified site: Secondary | ICD-10-CM

## 2014-08-30 DIAGNOSIS — C50911 Malignant neoplasm of unspecified site of right female breast: Secondary | ICD-10-CM

## 2014-08-30 DIAGNOSIS — C50912 Malignant neoplasm of unspecified site of left female breast: Secondary | ICD-10-CM

## 2014-08-30 LAB — CBC WITH DIFFERENTIAL/PLATELET
BASO%: 1.3 % (ref 0.0–2.0)
Basophils Absolute: 0.1 10*3/uL (ref 0.0–0.1)
EOS%: 3.9 % (ref 0.0–7.0)
Eosinophils Absolute: 0.2 10*3/uL (ref 0.0–0.5)
HCT: 39.3 % (ref 34.8–46.6)
HGB: 12.6 g/dL (ref 11.6–15.9)
LYMPH%: 58.2 % — ABNORMAL HIGH (ref 14.0–49.7)
MCH: 26.8 pg (ref 25.1–34.0)
MCHC: 32.1 g/dL (ref 31.5–36.0)
MCV: 83.6 fL (ref 79.5–101.0)
MONO#: 0.3 10*3/uL (ref 0.1–0.9)
MONO%: 7.2 % (ref 0.0–14.0)
NEUT#: 1.1 10*3/uL — ABNORMAL LOW (ref 1.5–6.5)
NEUT%: 29.4 % — ABNORMAL LOW (ref 38.4–76.8)
Platelets: 320 10*3/uL (ref 145–400)
RBC: 4.7 10*6/uL (ref 3.70–5.45)
RDW: 14.8 % — AB (ref 11.2–14.5)
WBC: 3.9 10*3/uL (ref 3.9–10.3)
lymph#: 2.3 10*3/uL (ref 0.9–3.3)

## 2014-08-30 LAB — COMPREHENSIVE METABOLIC PANEL (CC13)
ALBUMIN: 3.8 g/dL (ref 3.5–5.0)
ALT: 9 U/L (ref 0–55)
ANION GAP: 8 meq/L (ref 3–11)
AST: 15 U/L (ref 5–34)
Alkaline Phosphatase: 79 U/L (ref 40–150)
BUN: 12.8 mg/dL (ref 7.0–26.0)
CALCIUM: 9.9 mg/dL (ref 8.4–10.4)
CHLORIDE: 104 meq/L (ref 98–109)
CO2: 28 meq/L (ref 22–29)
Creatinine: 0.9 mg/dL (ref 0.6–1.1)
EGFR: 73 mL/min/{1.73_m2} — AB (ref >90)
GLUCOSE: 96 mg/dL (ref 70–140)
POTASSIUM: 4.4 meq/L (ref 3.5–5.1)
SODIUM: 141 meq/L (ref 136–145)
TOTAL PROTEIN: 7.7 g/dL (ref 6.4–8.3)
Total Bilirubin: 0.42 mg/dL (ref 0.20–1.20)

## 2014-10-04 ENCOUNTER — Ambulatory Visit
Admission: RE | Admit: 2014-10-04 | Discharge: 2014-10-04 | Disposition: A | Discharge status: Home / Self Care | Payer: Medicare Other | Source: Ambulatory Visit | Attending: Nurse Practitioner | Admitting: Nurse Practitioner

## 2014-10-04 DIAGNOSIS — Z13820 Encounter for screening for osteoporosis: Secondary | ICD-10-CM | POA: Diagnosis not present

## 2014-10-04 DIAGNOSIS — Z780 Asymptomatic menopausal state: Secondary | ICD-10-CM | POA: Diagnosis not present

## 2014-11-26 ENCOUNTER — Other Ambulatory Visit: Payer: Self-pay | Admitting: Nurse Practitioner

## 2014-11-29 ENCOUNTER — Other Ambulatory Visit: Payer: Self-pay | Admitting: Nurse Practitioner

## 2014-11-29 ENCOUNTER — Other Ambulatory Visit: Payer: Self-pay | Admitting: *Deleted

## 2014-11-29 MED ORDER — POTASSIUM CHLORIDE CRYS ER 20 MEQ PO TBCR: 40.0000 meq | EXTENDED_RELEASE_TABLET | Freq: Two times a day (BID) | ORAL | Status: DC

## 2014-12-20 ENCOUNTER — Other Ambulatory Visit: Payer: Self-pay | Admitting: Internal Medicine

## 2014-12-29 ENCOUNTER — Other Ambulatory Visit (INDEPENDENT_AMBULATORY_CARE_PROVIDER_SITE_OTHER): Payer: Medicare Other

## 2014-12-29 ENCOUNTER — Ambulatory Visit (INDEPENDENT_AMBULATORY_CARE_PROVIDER_SITE_OTHER): Payer: Medicare Other | Admitting: Internal Medicine

## 2014-12-29 ENCOUNTER — Encounter: Payer: Self-pay | Admitting: Internal Medicine

## 2014-12-29 VITALS — BP 142/90 | HR 65 | Wt 207.0 lb

## 2014-12-29 DIAGNOSIS — R51 Headache: Secondary | ICD-10-CM

## 2014-12-29 DIAGNOSIS — E559 Vitamin D deficiency, unspecified: Secondary | ICD-10-CM | POA: Diagnosis not present

## 2014-12-29 DIAGNOSIS — I10 Essential (primary) hypertension: Secondary | ICD-10-CM

## 2014-12-29 DIAGNOSIS — E538 Deficiency of other specified B group vitamins: Secondary | ICD-10-CM | POA: Diagnosis not present

## 2014-12-29 DIAGNOSIS — R519 Headache, unspecified: Secondary | ICD-10-CM

## 2014-12-29 DIAGNOSIS — R739 Hyperglycemia, unspecified: Secondary | ICD-10-CM

## 2014-12-29 DIAGNOSIS — Z23 Encounter for immunization: Secondary | ICD-10-CM | POA: Diagnosis not present

## 2014-12-29 LAB — SEDIMENTATION RATE: SED RATE: 23 mm/h — AB (ref 0–22)

## 2014-12-29 LAB — BASIC METABOLIC PANEL
BUN: 16 mg/dL (ref 6–23)
CHLORIDE: 101 meq/L (ref 96–112)
CO2: 32 mEq/L (ref 19–32)
CREATININE: 0.91 mg/dL (ref 0.40–1.20)
Calcium: 10.2 mg/dL (ref 8.4–10.5)
GFR: 76.51 mL/min (ref >60.00)
GLUCOSE: 86 mg/dL (ref 70–99)
Potassium: 4.2 mEq/L (ref 3.5–5.1)
Sodium: 139 mEq/L (ref 135–145)

## 2014-12-29 LAB — VITAMIN B12: Vitamin B-12: 226 pg/mL (ref 211–911)

## 2014-12-29 LAB — VITAMIN D 25 HYDROXY (VIT D DEFICIENCY, FRACTURES): VITD: 26.83 ng/mL — ABNORMAL LOW (ref 30.00–100.00)

## 2014-12-29 LAB — HEPATIC FUNCTION PANEL
ALK PHOS: 67 U/L (ref 39–117)
ALT: 7 U/L (ref 0–35)
AST: 13 U/L (ref 0–37)
Albumin: 3.9 g/dL (ref 3.5–5.2)
Bilirubin, Direct: 0.1 mg/dL (ref 0.0–0.3)
Total Bilirubin: 0.4 mg/dL (ref 0.2–1.2)
Total Protein: 7.5 g/dL (ref 6.0–8.3)

## 2014-12-29 LAB — HEMOGLOBIN A1C: HEMOGLOBIN A1C: 5.6 % (ref 4.6–6.5)

## 2014-12-29 LAB — TSH: TSH: 1.63 u[IU]/mL (ref 0.35–4.50)

## 2014-12-29 LAB — MAGNESIUM: Magnesium: 1.8 mg/dL (ref 1.5–2.5)

## 2014-12-29 MED ORDER — TRIAMCINOLONE ACETONIDE 0.5 % EX CREA: TOPICAL_CREAM | Freq: Two times a day (BID) | CUTANEOUS | Status: DC

## 2014-12-29 MED ORDER — CLOTRIMAZOLE-BETAMETHASONE 1-0.05 % EX CREA: TOPICAL_CREAM | Freq: Two times a day (BID) | CUTANEOUS | Status: DC

## 2014-12-29 MED ORDER — AMLODIPINE BESYLATE 10 MG PO TABS: 10.0000 mg | ORAL_TABLET | Freq: Every day | ORAL | Status: DC

## 2014-12-29 MED ORDER — POTASSIUM CHLORIDE CRYS ER 20 MEQ PO TBCR: 40.0000 meq | EXTENDED_RELEASE_TABLET | Freq: Two times a day (BID) | ORAL | Status: DC

## 2014-12-29 MED ORDER — LOSARTAN POTASSIUM 100 MG PO TABS: 100.0000 mg | ORAL_TABLET | Freq: Every day | ORAL | Status: DC

## 2014-12-29 NOTE — Assessment & Plan Note (Signed)
[facility]  Losartan, Amlodipine Labs

## 2014-12-29 NOTE — Assessment & Plan Note (Signed)
Scalp NT ESR Treat HTN

## 2014-12-29 NOTE — Assessment & Plan Note (Addendum)
On a MVI Vit D Rx and OTC

## 2014-12-29 NOTE — Assessment & Plan Note (Signed)
On PO Vit B12 (MVI) Labs

## 2014-12-29 NOTE — Progress Notes (Signed)
Pre visit review using our clinic review tool, if applicable. No additional management support is needed unless otherwise documented below in the visit note. 

## 2014-12-29 NOTE — Progress Notes (Signed)
- Subjective:    Headache  The current episode started in the past 7 days. The problem occurs intermittently. The pain is located in the bilateral region. The pain is moderate. Associated symptoms include back pain. Pertinent negatives include no abdominal pain, coughing, dizziness, ear pain, eye pain, eye redness, fever, hearing loss, nausea, neck pain, numbness, rhinorrhea, seizures, sinus pressure, sore throat, tinnitus, vomiting or weakness. The treatment provided significant (BP med) relief.     F/u chronic hypertension, dyslipidemia, chronic mild anemia, B12 def - all stable. C/o elev BP. C/o  Hand swelling (post-L mastectomy)  Past Medical History  Diagnosis Date  . Anemia     iron deficiency  . Diverticulosis of colon 2004    Dr Deatra Ina  . Hyperlipidemia   . Hypertension   . Vitamin D deficiency   . Vitamin B12 deficiency   . Hematuria     Dr Terance Hart  . Depression 2009    grief  . Osteoarthritis, knee   . Cancer     breast, skin  . GERD (gastroesophageal reflux disease)   . Heart murmur   . Acute lymphadenitis of arm    Past Surgical History  Procedure Laterality Date  . Total knee arthroplasty  06-04-98  . Mastectomy  09-13-08    left and right  . L groin skin cancer  2011  . Joint replacement    . Abdominal hysterectomy    . Total knee arthroplasty Right 05/24/2014    Procedure: RIGHT TOTAL KNEE ARTHROPLASTY;  Surgeon: Newt Minion, MD;  Location: [facility];  Service: Orthopedics;  Laterality: Right;    reports that she has never smoked. She does not have any smokeless tobacco history on file. She reports that she does not drink alcohol or use illicit drugs. family history includes Cancer in her maternal aunt, other, and sister; Coronary artery disease in her other. Allergies  Allergen Reactions  . Anastrozole     REACTION: arthralgias  . Aspirin Other (See Comments)    Gi upset   . Ciprofloxacin Itching  . Letrozole     REACTION: leg pain   Current  Outpatient Prescriptions on File Prior to Visit  Medication Sig Dispense Refill  . acetaminophen (TYLENOL) 500 MG tablet Take 1,000 mg by mouth every 6 (six) hours as needed.     Marland Kitchen amLODipine (NORVASC) 10 MG tablet TAKE 1 TABLET BY MOUTH EVERY DAY 90 tablet 1  . diphenhydrAMINE (SOMINEX) 25 MG tablet Take 25 mg by mouth at bedtime as needed for sleep.    Marland Kitchen losartan (COZAAR) 100 MG tablet TAKE 1 TABLET BY MOUTH EVERY DAY 90 tablet 1  . Multiple Vitamin (MULTIVITAMIN WITH MINERALS) TABS tablet Take 1 tablet by mouth daily.    . potassium chloride SA (K-DUR,KLOR-CON) 20 MEQ tablet Take 2 tablets (40 mEq total) by mouth 2 (two) times daily. 120 tablet 1  . triamcinolone cream (KENALOG) 0.5 % Apply topically 2 (two) times daily. 30 g 3   No current facility-administered medications on file prior to visit.     Review of Systems  Constitutional: Positive for fatigue. Negative for fever, chills, diaphoresis, activity change, appetite change and unexpected weight change.  HENT: Negative for congestion, ear pain, facial swelling, hearing loss, mouth sores, nosebleeds, postnasal drip, rhinorrhea, sinus pressure, sneezing, sore throat, tinnitus and trouble swallowing.   Eyes: Negative for pain, discharge, redness, itching and visual disturbance.  Respiratory: Negative for cough, chest tightness, shortness of breath, wheezing and stridor.   Cardiovascular:  Negative for chest pain, palpitations and leg swelling.  Gastrointestinal: Negative for nausea, vomiting, abdominal pain, diarrhea, constipation, blood in stool, abdominal distention, anal bleeding and rectal pain.  Genitourinary: Negative for dysuria, urgency, frequency, hematuria, flank pain, vaginal bleeding, vaginal discharge, difficulty urinating, genital sores, vaginal pain and pelvic pain.  Musculoskeletal: Positive for back pain, arthralgias and gait problem. Negative for joint swelling, neck pain and neck stiffness.  Skin: Negative.  Negative  for pallor and rash.  Neurological: Positive for headaches. Negative for dizziness, tremors, seizures, syncope, speech difficulty, weakness and numbness.  Hematological: Negative for adenopathy. Does not bruise/bleed easily.  Psychiatric/Behavioral: Negative for suicidal ideas, behavioral problems, confusion, sleep disturbance, dysphoric mood and decreased concentration. The patient is not nervous/anxious.    Wt Readings from Last 3 Encounters:  12/29/14 207 lb (93.895 kg)  08/21/14 201 lb (91.173 kg)  05/24/14 205 lb (92.987 kg)   BP Readings from Last 3 Encounters:  12/29/14 142/90  08/21/14 153/65  05/27/14 137/59   BP 142/90 mmHg  Pulse 65  Wt 207 lb (93.895 kg)  SpO2 94%      Objective:   Physical Exam  Constitutional: She appears well-developed. No distress.  NAD  HENT:  Head: Normocephalic.  Right Ear: External ear normal.  Left Ear: External ear normal.  Nose: Nose normal.  Mouth/Throat: Oropharynx is clear and moist.  Eyes: Conjunctivae are normal. Pupils are equal, round, and reactive to light. Right eye exhibits no discharge. Left eye exhibits no discharge.  Neck: Normal range of motion. Neck supple. No JVD present. No tracheal deviation present. No thyromegaly present.  Cardiovascular: Normal rate, regular rhythm and normal heart sounds.   Pulmonary/Chest: No stridor. No respiratory distress. She has no wheezes.  Abdominal: Soft. Bowel sounds are normal. She exhibits no distension and no mass. There is no tenderness. There is no rebound and no guarding.  Musculoskeletal: She exhibits tenderness. She exhibits no edema.  Lymphadenopathy:    She has no cervical adenopathy.  Neurological: She displays normal reflexes. No cranial nerve deficit. She exhibits normal muscle tone. Coordination abnormal.  Cane  Skin: No rash noted. No erythema.  Psychiatric: She has a normal mood and affect. Her behavior is normal. Judgment and thought content normal.  L hand is a little  puffy  Lab Results  Component Value Date   WBC 3.9 08/30/2014   HGB 12.6 08/30/2014   HCT 39.3 08/30/2014   PLT 320 08/30/2014   GLUCOSE 96 08/30/2014   ALT 9 08/30/2014   AST 15 08/30/2014   NA 141 08/30/2014   K 4.4 08/30/2014   CL 98 05/25/2014   CREATININE 0.9 08/30/2014   BUN 12.8 08/30/2014   CO2 28 08/30/2014   TSH 1.32 02/04/2013   INR 1.67* 05/27/2014   EKG - no new changes       Assessment & Plan:

## 2014-12-31 ENCOUNTER — Other Ambulatory Visit: Payer: Self-pay | Admitting: Internal Medicine

## 2014-12-31 MED ORDER — ERGOCALCIFEROL 1.25 MG (50000 UT) PO CAPS
50000.0000 [IU] | ORAL_CAPSULE | ORAL | Status: DC
Start: 2014-12-31 — End: 2016-07-17

## 2014-12-31 MED ORDER — CYANOCOBALAMIN 1000 MCG SL SUBL: 1.0000 | SUBLINGUAL_TABLET | Freq: Every day | SUBLINGUAL | Status: DC

## 2014-12-31 MED ORDER — VITAMIN D 1000 UNITS PO TABS: 1000.0000 [IU] | ORAL_TABLET | Freq: Every day | ORAL | Status: AC

## 2015-04-23 ENCOUNTER — Other Ambulatory Visit (INDEPENDENT_AMBULATORY_CARE_PROVIDER_SITE_OTHER): Payer: Medicare Other

## 2015-04-23 ENCOUNTER — Encounter: Payer: Self-pay | Admitting: Internal Medicine

## 2015-04-23 ENCOUNTER — Ambulatory Visit (INDEPENDENT_AMBULATORY_CARE_PROVIDER_SITE_OTHER): Payer: Medicare Other | Admitting: Internal Medicine

## 2015-04-23 VITALS — BP 130/68 | HR 73 | Wt 216.0 lb

## 2015-04-23 DIAGNOSIS — I10 Essential (primary) hypertension: Secondary | ICD-10-CM

## 2015-04-23 DIAGNOSIS — E538 Deficiency of other specified B group vitamins: Secondary | ICD-10-CM

## 2015-04-23 DIAGNOSIS — G4700 Insomnia, unspecified: Secondary | ICD-10-CM | POA: Diagnosis not present

## 2015-04-23 DIAGNOSIS — R21 Rash and other nonspecific skin eruption: Secondary | ICD-10-CM

## 2015-04-23 DIAGNOSIS — R6889 Other general symptoms and signs: Secondary | ICD-10-CM

## 2015-04-23 LAB — HEPATIC FUNCTION PANEL
ALK PHOS: 63 U/L (ref 39–117)
ALT: 9 U/L (ref 0–35)
AST: 14 U/L (ref 0–37)
Albumin: 4 g/dL (ref 3.5–5.2)
BILIRUBIN TOTAL: 0.4 mg/dL (ref 0.2–1.2)
Bilirubin, Direct: 0 mg/dL (ref 0.0–0.3)
Total Protein: 7.4 g/dL (ref 6.0–8.3)

## 2015-04-23 LAB — BASIC METABOLIC PANEL
BUN: 17 mg/dL (ref 6–23)
CALCIUM: 9.8 mg/dL (ref 8.4–10.5)
CO2: 29 meq/L (ref 19–32)
CREATININE: 1.05 mg/dL (ref 0.40–1.20)
Chloride: 103 mEq/L (ref 96–112)
GFR: 64.81 mL/min (ref >60.00)
GLUCOSE: 113 mg/dL — AB (ref 70–99)
Potassium: 3.7 mEq/L (ref 3.5–5.1)
SODIUM: 139 meq/L (ref 135–145)

## 2015-04-23 LAB — URINALYSIS, ROUTINE W REFLEX MICROSCOPIC
Bilirubin Urine: NEGATIVE
Ketones, ur: NEGATIVE
LEUKOCYTES UA: NEGATIVE
Nitrite: NEGATIVE
PH: 5.5 (ref 5.0–8.0)
SPECIFIC GRAVITY, URINE: 1.025 (ref 1.000–1.030)
Total Protein, Urine: NEGATIVE
URINE GLUCOSE: NEGATIVE
UROBILINOGEN UA: 0.2 (ref 0.0–1.0)

## 2015-04-23 LAB — T4, FREE: FREE T4: 0.76 ng/dL (ref 0.60–1.60)

## 2015-04-23 LAB — CBC WITH DIFFERENTIAL/PLATELET
BASOS ABS: 0 10*3/uL (ref 0.0–0.1)
Basophils Relative: 0.7 % (ref 0.0–3.0)
EOS ABS: 0.2 10*3/uL (ref 0.0–0.7)
Eosinophils Relative: 2.9 % (ref 0.0–5.0)
HCT: 37.3 % (ref 36.0–46.0)
Hemoglobin: 12.4 g/dL (ref 12.0–15.0)
LYMPHS ABS: 2.9 10*3/uL (ref 0.7–4.0)
Lymphocytes Relative: 49.4 % — ABNORMAL HIGH (ref 12.0–46.0)
MCHC: 33.3 g/dL (ref 30.0–36.0)
MCV: 86.5 fl (ref 78.0–100.0)
MONO ABS: 0.5 10*3/uL (ref 0.1–1.0)
Monocytes Relative: 9.4 % (ref 3.0–12.0)
NEUTROS ABS: 2.2 10*3/uL (ref 1.4–7.7)
NEUTROS PCT: 37.6 % — AB (ref 43.0–77.0)
PLATELETS: 269 10*3/uL (ref 150.0–400.0)
RBC: 4.31 Mil/uL (ref 3.87–5.11)
RDW: 14.7 % (ref 11.5–15.5)
WBC: 5.8 10*3/uL (ref 4.0–10.5)

## 2015-04-23 LAB — VITAMIN B12: VITAMIN B 12: 242 pg/mL (ref 211–911)

## 2015-04-23 LAB — TSH: TSH: 1.16 u[IU]/mL (ref 0.35–4.50)

## 2015-04-23 MED ORDER — HYDROCORTISONE 2.5 % EX OINT: TOPICAL_OINTMENT | Freq: Two times a day (BID) | CUTANEOUS | Status: DC

## 2015-04-23 NOTE — Assessment & Plan Note (Signed)
8/16 x3 mo ?[employer]

## 2015-04-23 NOTE — Assessment & Plan Note (Signed)
Chronic  Prosom prn

## 2015-04-23 NOTE — Assessment & Plan Note (Signed)
On B12 

## 2015-04-23 NOTE — Progress Notes (Signed)
Subjective:  Patient ID: [name] [name], female    DOB: December 11, 1932  Age: 81 y.o. MRN: [id number]  CC: No chief complaint on file.   HPI SHEREE LALLA presents for rash, B12 def, HTN f/u. C/o being cold all the time x 3 mo.  Outpatient Prescriptions Prior to Visit  Medication Sig Dispense Refill  . acetaminophen (TYLENOL) 500 MG tablet Take 1,000 mg by mouth every 6 (six) hours as needed.     Marland Kitchen amLODipine (NORVASC) 10 MG tablet Take 1 tablet (10 mg total) by mouth daily. 90 tablet 3  . cholecalciferol (VITAMIN D) 1000 UNITS tablet Take 1 tablet (1,000 Units total) by mouth daily. 100 tablet 3  . Cyanocobalamin 1000 MCG SUBL Place 1 tablet (1,000 mcg total) under the tongue daily. 100 tablet 11  . diphenhydrAMINE (SOMINEX) 25 MG tablet Take 25 mg by mouth at bedtime as needed for sleep.    . ergocalciferol (VITAMIN D2) 50000 UNITS capsule Take 1 capsule (50,000 Units total) by mouth once a week. 6 capsule 0  . losartan (COZAAR) 100 MG tablet Take 1 tablet (100 mg total) by mouth daily. 90 tablet 3  . Multiple Vitamin (MULTIVITAMIN WITH MINERALS) TABS tablet Take 1 tablet by mouth daily.    . potassium chloride SA (K-DUR,KLOR-CON) 20 MEQ tablet Take 2 tablets (40 mEq total) by mouth 2 (two) times daily. 120 tablet 3  . clotrimazole-betamethasone (LOTRISONE) cream Apply topically 2 (two) times daily. (Patient not taking: Reported on 04/23/2015) 45 g 3  . triamcinolone cream (KENALOG) 0.5 % Apply topically 2 (two) times daily. (Patient not taking: Reported on 04/23/2015) 30 g 3   No facility-administered medications prior to visit.    ROS Review of Systems  Constitutional: Positive for unexpected weight change. Negative for fever, chills, activity change, appetite change and fatigue.  HENT: Negative for congestion, mouth sores and sinus pressure.   Eyes: Negative for visual disturbance.  Respiratory: Negative for cough and chest tightness.   Gastrointestinal: Negative for nausea and  abdominal pain.  Genitourinary: Negative for frequency, difficulty urinating and vaginal pain.  Musculoskeletal: Negative for back pain and gait problem.  Skin: Negative for pallor and rash.  Neurological: Negative for dizziness, tremors, weakness, numbness and headaches.  Psychiatric/Behavioral: Positive for sleep disturbance. Negative for suicidal ideas and confusion. The patient is nervous/anxious.     Objective:  BP 130/68 mmHg  Pulse 73  Wt 216 lb (97.977 kg)  SpO2 97%  BP Readings from Last 3 Encounters:  04/23/15 130/68  12/29/14 142/90  08/21/14 153/65    Wt Readings from Last 3 Encounters:  04/23/15 216 lb (97.977 kg)  12/29/14 207 lb (93.895 kg)  08/21/14 201 lb (91.173 kg)    Physical Exam  Constitutional: She appears well-developed. No distress.  HENT:  Head: Normocephalic.  Right Ear: External ear normal.  Left Ear: External ear normal.  Nose: Nose normal.  Mouth/Throat: Oropharynx is clear and moist.  Eyes: Conjunctivae are normal. Pupils are equal, round, and reactive to light. Right eye exhibits no discharge. Left eye exhibits no discharge.  Neck: Normal range of motion. Neck supple. No JVD present. No tracheal deviation present. No thyromegaly present.  Cardiovascular: Normal rate, regular rhythm and normal heart sounds.   Pulmonary/Chest: No stridor. No respiratory distress. She has no wheezes.  Abdominal: Soft. Bowel sounds are normal. She exhibits no distension and no mass. There is no tenderness. There is no rebound and no guarding.  Musculoskeletal: She exhibits no edema  or tenderness.  Lymphadenopathy:    She has no cervical adenopathy.  Neurological: She displays normal reflexes. No cranial nerve deficit. She exhibits normal muscle tone. Coordination normal.  Skin: No rash noted. No erythema.  Psychiatric: She has a normal mood and affect. Her behavior is normal. Judgment and thought content normal.  Obese  Lab Results  Component Value Date    WBC 3.9 08/30/2014   HGB 12.6 08/30/2014   HCT 39.3 08/30/2014   PLT 320 08/30/2014   GLUCOSE 86 12/29/2014   ALT 7 12/29/2014   AST 13 12/29/2014   NA 139 12/29/2014   K 4.2 12/29/2014   CL 101 12/29/2014   CREATININE 0.91 12/29/2014   BUN 16 12/29/2014   CO2 32 12/29/2014   TSH 1.63 12/29/2014   INR 1.67* 05/27/2014   HGBA1C 5.6 12/29/2014    Dg Bone Density  10/04/2014   CLINICAL DATA:  81 year old postmenopausal female.  EXAM: DUAL X-RAY ABSORPTIOMETRY (DXA) FOR BONE MINERAL DENSITY  FINDINGS: LEFT FEMUR NECK  Bone Mineral Density (BMD):  0.941 g/cm2  Young Adult T-Score: 0.8  Z-Score:  1.7  LEFT FOREARM (1/3 RADIUS)  Bone Mineral Density (BMD):  0.775  Young Adult T Score:  1.3  Z Score:  4.4  ASSESSMENT: Patient's diagnostic category is normal by WHO Criteria.  FRACTURE RISK: Not increased  COMPARISON: When compared to baseline evaluation 08/20/2007 there has been a statistically significant interval decrease in bone mineral density of the left hip of 3.4%. When compared to prior evaluation 01/06/2011 there has been a statistically significant interval increase in bone mineral density of the left forearm of 4.2% and a statistically significant interval decrease in bone mineral density of the left hip of 3.4%.  Effective therapies are available in the form of bisphosphonates, selective estrogen receptor modulators, biologic agents, and hormone replacement therapy (for women). All patients should ensure an adequate intake of dietary calcium (1200 mg daily) and vitamin D (800 IU daily) unless contraindicated.  All treatment decisions require clinical judgment and consideration of individual patient factors, including patient preferences, co-morbidities, previous drug use, risk factors not captured in the FRAX model (e.g., frailty, falls, vitamin D deficiency, increased bone turnover, interval significant decline in bone density) and possible under- or over-estimation of fracture risk by FRAX.   The National Osteoporosis Foundation recommends that FDA-approved medical therapies be considered in postmenopausal women and men age 41 or older with a:  1. Hip or vertebral (clinical or morphometric) fracture.  2. T-score of -2.5 or lower at the spine or hip.  3. Ten-year fracture probability by FRAX of 3% or greater for hip fracture or 20% or greater for major osteoporotic fracture.  People with diagnosed cases of osteoporosis or at high risk for fracture should have regular bone mineral density tests. For patients eligible for Medicare, routine testing is allowed once every 2 years. The testing frequency can be increased to one year for patients who have rapidly progressing disease, those who are receiving or discontinuing medical therapy to restore bone mass, or have additional risk factors.  World Pharmacologist [place]) Criteria:  Normal: T-scores from +1.0 to -1.0  Low Bone Mass (Osteopenia): T-scores between -1.0 and -2.5  Osteoporosis: T-scores -2.5 and below  Comparison to Reference Population:  T-score is the key measure used in the diagnosis of osteoporosis and relative risk determination for fracture. It provides a value for bone mass relative to the mean bone mass of a young adult reference population expressed in terms  of standard deviation (SD).  Z-score is the age-matched score showing the patient's values compared to a population matched for age, sex, and race. This is also expressed in terms of standard deviation. The patient may have values that compare favorably to the age-matched values and still be at increased risk for fracture.   Electronically Signed   By: Lovey Newcomer M.D.   On: 10/04/2014 18:28    Assessment & Plan:   There are no diagnoses linked to this encounter. I am having Ms. Bundrick maintain her acetaminophen, multivitamin with minerals, diphenhydrAMINE, triamcinolone cream, clotrimazole-betamethasone, potassium chloride SA, amLODipine, losartan, ergocalciferol,  cholecalciferol, and Cyanocobalamin.  No orders of the defined types were placed in this encounter.     Follow-up: No Follow-up on file.  Walker Kehr, MD

## 2015-04-23 NOTE — Assessment & Plan Note (Signed)
Chronic  Losartan, Amlodipine

## 2015-04-23 NOTE — Assessment & Plan Note (Signed)
HCT 2.5% oint

## 2015-04-23 NOTE — Progress Notes (Signed)
Pre visit review using our clinic review tool, if applicable. No additional management support is needed unless otherwise documented below in the visit note. 

## 2015-04-24 ENCOUNTER — Telehealth: Payer: Self-pay

## 2015-04-24 NOTE — Telephone Encounter (Signed)
Patient states she has been continually taking b12, patient has scheduled office visit for 8/24 at 11:00 to talk with dr plotnikov about starting b12 injections

## 2015-04-24 NOTE — Telephone Encounter (Signed)
-----   Message from Cassandria Anger, MD sent at 04/23/2015  9:26 PM EDT ----- [name] [name], please, inform patient that all labs are normal except for low normal Vit B12: pls restart Vit B12. If on B12 now - then we need to switch to shots (come for OV to start shots of B12)   Thx

## 2015-04-25 ENCOUNTER — Encounter: Payer: Self-pay | Admitting: Internal Medicine

## 2015-04-25 ENCOUNTER — Ambulatory Visit (INDEPENDENT_AMBULATORY_CARE_PROVIDER_SITE_OTHER): Payer: Medicare Other | Admitting: Internal Medicine

## 2015-04-25 VITALS — BP 130/76 | HR 65

## 2015-04-25 DIAGNOSIS — E538 Deficiency of other specified B group vitamins: Secondary | ICD-10-CM | POA: Diagnosis not present

## 2015-04-25 MED ORDER — CYANOCOBALAMIN 1000 MCG SL SUBL: 1.0000 | SUBLINGUAL_TABLET | Freq: Every day | SUBLINGUAL | Status: DC

## 2015-04-25 MED ORDER — CYANOCOBALAMIN 1000 MCG/ML IJ SOLN
1000.0000 ug | Freq: Once | INTRAMUSCULAR | Status: AC
  Administered 2015-04-25: 1000 ug via INTRAMUSCULAR

## 2015-04-25 NOTE — Progress Notes (Signed)
Subjective:  Patient ID: [name] [name], female    DOB: 03-Sep-1932  Age: 81 y.o. MRN: [id number]  CC: No chief complaint on file.   HPI FLORENDA WATT presents for vit B12 def and fatigue.  Outpatient Prescriptions Prior to Visit  Medication Sig Dispense Refill  . acetaminophen (TYLENOL) 500 MG tablet Take 1,000 mg by mouth every 6 (six) hours as needed.     Marland Kitchen amLODipine (NORVASC) 10 MG tablet Take 1 tablet (10 mg total) by mouth daily. 90 tablet 3  . cholecalciferol (VITAMIN D) 1000 UNITS tablet Take 1 tablet (1,000 Units total) by mouth daily. 100 tablet 3  . diphenhydrAMINE (SOMINEX) 25 MG tablet Take 25 mg by mouth at bedtime as needed for sleep.    . ergocalciferol (VITAMIN D2) 50000 UNITS capsule Take 1 capsule (50,000 Units total) by mouth once a week. 6 capsule 0  . hydrocortisone 2.5 % ointment Apply topically 2 (two) times daily. 30 g 3  . losartan (COZAAR) 100 MG tablet Take 1 tablet (100 mg total) by mouth daily. 90 tablet 3  . Multiple Vitamin (MULTIVITAMIN WITH MINERALS) TABS tablet Take 1 tablet by mouth daily.    . potassium chloride SA (K-DUR,KLOR-CON) 20 MEQ tablet Take 2 tablets (40 mEq total) by mouth 2 (two) times daily. 120 tablet 3  . Cyanocobalamin 1000 MCG SUBL Place 1 tablet (1,000 mcg total) under the tongue daily. 100 tablet 11   No facility-administered medications prior to visit.    ROS Review of Systems  Constitutional: Positive for fatigue. Negative for chills, activity change, appetite change and unexpected weight change.  HENT: Negative for congestion, mouth sores and sinus pressure.   Eyes: Negative for visual disturbance.  Respiratory: Negative for cough and chest tightness.   Gastrointestinal: Negative for nausea and abdominal pain.  Genitourinary: Negative for frequency, difficulty urinating and vaginal pain.  Musculoskeletal: Negative for back pain and gait problem.  Skin: Negative for pallor and rash.  Neurological: Negative for  dizziness, tremors, weakness, numbness and headaches.  Psychiatric/Behavioral: Negative for confusion and sleep disturbance. The patient is not nervous/anxious.     Objective:  BP 130/76 mmHg  Pulse 65  SpO2 97%  BP Readings from Last 3 Encounters:  04/25/15 130/76  04/23/15 130/68  12/29/14 142/90    Wt Readings from Last 3 Encounters:  04/23/15 216 lb (97.977 kg)  12/29/14 207 lb (93.895 kg)  08/21/14 201 lb (91.173 kg)    Physical Exam  Constitutional: She appears well-developed. No distress.  HENT:  Head: Normocephalic.  Right Ear: External ear normal.  Left Ear: External ear normal.  Nose: Nose normal.  Mouth/Throat: Oropharynx is clear and moist.  Eyes: Conjunctivae are normal. Pupils are equal, round, and reactive to light. Right eye exhibits no discharge. Left eye exhibits no discharge.  Neck: Normal range of motion. Neck supple. No JVD present. No tracheal deviation present. No thyromegaly present.  Cardiovascular: Normal rate, regular rhythm and normal heart sounds.   Pulmonary/Chest: No stridor. No respiratory distress. She has no wheezes.  Abdominal: Soft. Bowel sounds are normal. She exhibits no distension and no mass. There is no tenderness. There is no rebound and no guarding.  Musculoskeletal: She exhibits no edema or tenderness.  Lymphadenopathy:    She has no cervical adenopathy.  Neurological: She displays normal reflexes. No cranial nerve deficit. She exhibits normal muscle tone. Coordination normal.  Skin: No rash noted. No erythema.  Psychiatric: She has a normal mood and affect. Her  behavior is normal. Judgment and thought content normal.    Lab Results  Component Value Date   WBC 5.8 04/23/2015   HGB 12.4 04/23/2015   HCT 37.3 04/23/2015   PLT 269.0 04/23/2015   GLUCOSE 113* 04/23/2015   ALT 9 04/23/2015   AST 14 04/23/2015   NA 139 04/23/2015   K 3.7 04/23/2015   CL 103 04/23/2015   CREATININE 1.05 04/23/2015   BUN 17 04/23/2015   CO2  29 04/23/2015   TSH 1.16 04/23/2015   INR 1.67* 05/27/2014   HGBA1C 5.6 12/29/2014    Dg Bone Density  10/04/2014   CLINICAL DATA:  81 year old postmenopausal female.  EXAM: DUAL X-RAY ABSORPTIOMETRY (DXA) FOR BONE MINERAL DENSITY  FINDINGS: LEFT FEMUR NECK  Bone Mineral Density (BMD):  0.941 g/cm2  Young Adult T-Score: 0.8  Z-Score:  1.7  LEFT FOREARM (1/3 RADIUS)  Bone Mineral Density (BMD):  0.775  Young Adult T Score:  1.3  Z Score:  4.4  ASSESSMENT: Patient's diagnostic category is normal by WHO Criteria.  FRACTURE RISK: Not increased  COMPARISON: When compared to baseline evaluation 08/20/2007 there has been a statistically significant interval decrease in bone mineral density of the left hip of 3.4%. When compared to prior evaluation 01/06/2011 there has been a statistically significant interval increase in bone mineral density of the left forearm of 4.2% and a statistically significant interval decrease in bone mineral density of the left hip of 3.4%.  Effective therapies are available in the form of bisphosphonates, selective estrogen receptor modulators, biologic agents, and hormone replacement therapy (for women). All patients should ensure an adequate intake of dietary calcium (1200 mg daily) and vitamin D (800 IU daily) unless contraindicated.  All treatment decisions require clinical judgment and consideration of individual patient factors, including patient preferences, co-morbidities, previous drug use, risk factors not captured in the FRAX model (e.g., frailty, falls, vitamin D deficiency, increased bone turnover, interval significant decline in bone density) and possible under- or over-estimation of fracture risk by FRAX.  The National Osteoporosis Foundation recommends that FDA-approved medical therapies be considered in postmenopausal women and men age 57 or older with a:  1. Hip or vertebral (clinical or morphometric) fracture.  2. T-score of -2.5 or lower at the spine or hip.  3. Ten-year  fracture probability by FRAX of 3% or greater for hip fracture or 20% or greater for major osteoporotic fracture.  People with diagnosed cases of osteoporosis or at high risk for fracture should have regular bone mineral density tests. For patients eligible for Medicare, routine testing is allowed once every 2 years. The testing frequency can be increased to one year for patients who have rapidly progressing disease, those who are receiving or discontinuing medical therapy to restore bone mass, or have additional risk factors.  World Pharmacologist [facility]) Criteria:  Normal: T-scores from +1.0 to -1.0  Low Bone Mass (Osteopenia): T-scores between -1.0 and -2.5  Osteoporosis: T-scores -2.5 and below  Comparison to Reference Population:  T-score is the key measure used in the diagnosis of osteoporosis and relative risk determination for fracture. It provides a value for bone mass relative to the mean bone mass of a young adult reference population expressed in terms of standard deviation (SD).  Z-score is the age-matched score showing the patient's values compared to a population matched for age, sex, and race. This is also expressed in terms of standard deviation. The patient may have values that compare favorably to the age-matched values and  still be at increased risk for fracture.   Electronically Signed   By: Lovey Newcomer M.D.   On: 10/04/2014 18:28    Assessment & Plan:   Diagnoses and all orders for this visit:  B12 deficiency -     cyanocobalamin ((VITAMIN B-12)) injection 1,000 mcg; Inject 1 mL (1,000 mcg total) into the muscle once.  Other orders -     Cyanocobalamin 1000 MCG SUBL; Place 1 tablet (1,000 mcg total) under the tongue daily.   I am having Ms. Dekker maintain her acetaminophen, multivitamin with minerals, diphenhydrAMINE, potassium chloride SA, amLODipine, losartan, ergocalciferol, cholecalciferol, hydrocortisone, and Cyanocobalamin. We administered cyanocobalamin.  Meds ordered  this encounter  Medications  . Cyanocobalamin 1000 MCG SUBL    Sig: Place 1 tablet (1,000 mcg total) under the tongue daily.    Dispense:  100 tablet    Refill:  11  . cyanocobalamin ((VITAMIN B-12)) injection 1,000 mcg    Sig:      Follow-up: Return in about 3 months (around 07/26/2015) for a follow-up visit.  Walker Kehr, MD

## 2015-04-25 NOTE — Progress Notes (Signed)
Pre visit review using our clinic review tool, if applicable. No additional management support is needed unless otherwise documented below in the visit note. 

## 2015-04-25 NOTE — Assessment & Plan Note (Signed)
8/16 - start SQ and SL

## 2015-04-27 ENCOUNTER — Telehealth: Payer: Self-pay | Admitting: Internal Medicine

## 2015-04-27 NOTE — Telephone Encounter (Signed)
Patient was in yesterday and was given a B12 shot and a prescription. She is wondering if she needs to take the pills also. Please inform patient at [phone]

## 2015-04-27 NOTE — Telephone Encounter (Signed)
Called pt inform her since she is taking the injections she does not need to take the pills...[name] [name]

## 2015-04-30 ENCOUNTER — Telehealth: Payer: Self-pay | Admitting: Internal Medicine

## 2015-04-30 MED ORDER — AMOXICILLIN-POT CLAVULANATE 875-125 MG PO TABS: 1.0000 | ORAL_TABLET | Freq: Two times a day (BID) | ORAL | Status: DC

## 2015-04-30 NOTE — Telephone Encounter (Signed)
Rx sent by PCP.See meds.  Pt informed

## 2015-04-30 NOTE — Telephone Encounter (Signed)
Augmentin emailed. Low residue diet x 10 d. RTC 2 wks Thx

## 2015-04-30 NOTE — Telephone Encounter (Addendum)
Patient stated that her diverticulitis has flarred up could you send her something into her pharmacy [facility], [place]

## 2015-05-01 NOTE — Telephone Encounter (Signed)
Pt informed. OV scheduled.

## 2015-05-15 ENCOUNTER — Ambulatory Visit: Payer: Medicare Other | Admitting: Internal Medicine

## 2015-05-25 ENCOUNTER — Ambulatory Visit (INDEPENDENT_AMBULATORY_CARE_PROVIDER_SITE_OTHER): Payer: Medicare Other | Admitting: Internal Medicine

## 2015-05-25 ENCOUNTER — Encounter: Payer: Self-pay | Admitting: Internal Medicine

## 2015-05-25 VITALS — BP 138/74 | HR 63 | Wt 213.0 lb

## 2015-05-25 DIAGNOSIS — E538 Deficiency of other specified B group vitamins: Secondary | ICD-10-CM | POA: Diagnosis not present

## 2015-05-25 DIAGNOSIS — Z23 Encounter for immunization: Secondary | ICD-10-CM | POA: Diagnosis not present

## 2015-05-25 DIAGNOSIS — I10 Essential (primary) hypertension: Secondary | ICD-10-CM | POA: Diagnosis not present

## 2015-05-25 DIAGNOSIS — G4700 Insomnia, unspecified: Secondary | ICD-10-CM

## 2015-05-25 DIAGNOSIS — R1032 Left lower quadrant pain: Secondary | ICD-10-CM

## 2015-05-25 DIAGNOSIS — R1030 Lower abdominal pain, unspecified: Secondary | ICD-10-CM | POA: Insufficient documentation

## 2015-05-25 NOTE — Progress Notes (Signed)
Subjective:  Patient ID: [name] [name], female    DOB: 06-03-1933  Age: 81 y.o. MRN: [id number]  CC: No chief complaint on file.   HPI [name] [name] presents for B12 def and fatigue f/u - better. C/o L groin mild pain at times - relieved w/rubbing, putting pressure on it; no bulging.  Outpatient Prescriptions Prior to Visit  Medication Sig Dispense Refill  . acetaminophen (TYLENOL) 500 MG tablet Take 1,000 mg by mouth every 6 (six) hours as needed.     Marland Kitchen amLODipine (NORVASC) 10 MG tablet Take 1 tablet (10 mg total) by mouth daily. 90 tablet 3  . amoxicillin-clavulanate (AUGMENTIN) 875-125 MG per tablet Take 1 tablet by mouth 2 (two) times daily. 20 tablet 0  . cholecalciferol (VITAMIN D) 1000 UNITS tablet Take 1 tablet (1,000 Units total) by mouth daily. 100 tablet 3  . Cyanocobalamin 1000 MCG SUBL Place 1 tablet (1,000 mcg total) under the tongue daily. 100 tablet 11  . diphenhydrAMINE (SOMINEX) 25 MG tablet Take 25 mg by mouth at bedtime as needed for sleep.    . ergocalciferol (VITAMIN D2) 50000 UNITS capsule Take 1 capsule (50,000 Units total) by mouth once a week. 6 capsule 0  . hydrocortisone 2.5 % ointment Apply topically 2 (two) times daily. 30 g 3  . losartan (COZAAR) 100 MG tablet Take 1 tablet (100 mg total) by mouth daily. 90 tablet 3  . Multiple Vitamin (MULTIVITAMIN WITH MINERALS) TABS tablet Take 1 tablet by mouth daily.    . potassium chloride SA (K-DUR,KLOR-CON) 20 MEQ tablet Take 2 tablets (40 mEq total) by mouth 2 (two) times daily. 120 tablet 3   No facility-administered medications prior to visit.    ROS Review of Systems  Constitutional: Negative for chills, activity change, appetite change, fatigue and unexpected weight change.  HENT: Negative for congestion, mouth sores and sinus pressure.   Eyes: Negative for visual disturbance.  Respiratory: Negative for cough and chest tightness.   Gastrointestinal: Negative for nausea and abdominal pain.    Genitourinary: Negative for frequency, difficulty urinating and vaginal pain.  Musculoskeletal: Negative for back pain and gait problem.  Skin: Negative for pallor and rash.  Neurological: Negative for dizziness, tremors, weakness, numbness and headaches.  Psychiatric/Behavioral: Negative for confusion and sleep disturbance.    Objective:  BP 138/74 mmHg  Pulse 63  Wt 213 lb (96.616 kg)  SpO2 93%  BP Readings from Last 3 Encounters:  05/25/15 138/74  04/25/15 130/76  04/23/15 130/68    Wt Readings from Last 3 Encounters:  05/25/15 213 lb (96.616 kg)  04/23/15 216 lb (97.977 kg)  12/29/14 207 lb (93.895 kg)    Physical Exam  Constitutional: She appears well-developed. No distress.  HENT:  Head: Normocephalic.  Right Ear: External ear normal.  Left Ear: External ear normal.  Nose: Nose normal.  Mouth/Throat: Oropharynx is clear and moist.  Eyes: Conjunctivae are normal. Pupils are equal, round, and reactive to light. Right eye exhibits no discharge. Left eye exhibits no discharge.  Neck: Normal range of motion. Neck supple. No JVD present. No tracheal deviation present. No thyromegaly present.  Cardiovascular: Normal rate, regular rhythm and normal heart sounds.   Pulmonary/Chest: No stridor. No respiratory distress. She has no wheezes.  Abdominal: Soft. Bowel sounds are normal. She exhibits no distension and no mass. There is no tenderness. There is no rebound and no guarding.  Musculoskeletal: She exhibits no edema or tenderness.  Lymphadenopathy:    She has  no cervical adenopathy.  Neurological: She displays normal reflexes. No cranial nerve deficit. She exhibits normal muscle tone. Coordination normal.  Skin: No rash noted. No erythema.  Psychiatric: She has a normal mood and affect. Her behavior is normal. Judgment and thought content normal.  Obese Groin NT, no mass  Lab Results  Component Value Date   WBC 5.8 04/23/2015   HGB 12.4 04/23/2015   HCT 37.3  04/23/2015   PLT 269.0 04/23/2015   GLUCOSE 113* 04/23/2015   ALT 9 04/23/2015   AST 14 04/23/2015   NA 139 04/23/2015   K 3.7 04/23/2015   CL 103 04/23/2015   CREATININE 1.05 04/23/2015   BUN 17 04/23/2015   CO2 29 04/23/2015   TSH 1.16 04/23/2015   INR 1.67* 05/27/2014   HGBA1C 5.6 12/29/2014    Dg Bone Density  10/04/2014   CLINICAL DATA:  81 year old postmenopausal female.  EXAM: DUAL X-RAY ABSORPTIOMETRY (DXA) FOR BONE MINERAL DENSITY  FINDINGS: LEFT FEMUR NECK  Bone Mineral Density (BMD):  0.941 g/cm2  Young Adult T-Score: 0.8  Z-Score:  1.7  LEFT FOREARM (1/3 RADIUS)  Bone Mineral Density (BMD):  0.775  Young Adult T Score:  1.3  Z Score:  4.4  ASSESSMENT: Patient's diagnostic category is normal by WHO Criteria.  FRACTURE RISK: Not increased  COMPARISON: When compared to baseline evaluation 08/20/2007 there has been a statistically significant interval decrease in bone mineral density of the left hip of 3.4%. When compared to prior evaluation 01/06/2011 there has been a statistically significant interval increase in bone mineral density of the left forearm of 4.2% and a statistically significant interval decrease in bone mineral density of the left hip of 3.4%.  Effective therapies are available in the form of bisphosphonates, selective estrogen receptor modulators, biologic agents, and hormone replacement therapy (for women). All patients should ensure an adequate intake of dietary calcium (1200 mg daily) and vitamin D (800 IU daily) unless contraindicated.  All treatment decisions require clinical judgment and consideration of individual patient factors, including patient preferences, co-morbidities, previous drug use, risk factors not captured in the FRAX model (e.g., frailty, falls, vitamin D deficiency, increased bone turnover, interval significant decline in bone density) and possible under- or over-estimation of fracture risk by FRAX.  The National Osteoporosis Foundation recommends  that FDA-approved medical therapies be considered in postmenopausal women and men age 73 or older with a:  1. Hip or vertebral (clinical or morphometric) fracture.  2. T-score of -2.5 or lower at the spine or hip.  3. Ten-year fracture probability by FRAX of 3% or greater for hip fracture or 20% or greater for major osteoporotic fracture.  People with diagnosed cases of osteoporosis or at high risk for fracture should have regular bone mineral density tests. For patients eligible for Medicare, routine testing is allowed once every 2 years. The testing frequency can be increased to one year for patients who have rapidly progressing disease, those who are receiving or discontinuing medical therapy to restore bone mass, or have additional risk factors.  [facility]) Criteria:  Normal: T-scores from +1.0 to -1.0  Low Bone Mass (Osteopenia): T-scores between -1.0 and -2.5  Osteoporosis: T-scores -2.5 and below  Comparison to Reference Population:  T-score is the key measure used in the diagnosis of osteoporosis and relative risk determination for fracture. It provides a value for bone mass relative to the mean bone mass of a young adult reference population expressed in terms of standard deviation (SD).  Z-score is the age-matched score showing the patient's values compared to a population matched for age, sex, and race. This is also expressed in terms of standard deviation. The patient may have values that compare favorably to the age-matched values and still be at increased risk for fracture.   Electronically Signed   By: Lovey Newcomer M.D.   On: 10/04/2014 18:28    Assessment & Plan:   There are no diagnoses linked to this encounter. I am having Ms. Demo maintain her acetaminophen, multivitamin with minerals, diphenhydrAMINE, potassium chloride SA, amLODipine, losartan, ergocalciferol, cholecalciferol, hydrocortisone, Cyanocobalamin, and amoxicillin-clavulanate.  No orders of the defined  types were placed in this encounter.     Follow-up: No Follow-up on file.  Walker Kehr, MD

## 2015-05-25 NOTE — Progress Notes (Signed)
Pre visit review using our clinic review tool, if applicable. No additional management support is needed unless otherwise documented below in the visit note. 

## 2015-05-25 NOTE — Assessment & Plan Note (Signed)
Losartan, Amlodipine

## 2015-05-25 NOTE — Assessment & Plan Note (Signed)
On B12 

## 2015-05-25 NOTE — Assessment & Plan Note (Signed)
On Rx 

## 2015-05-25 NOTE — Assessment & Plan Note (Signed)
9/16 mild on L ?MSK vs other. No hernia Will watch

## 2015-08-01 DIAGNOSIS — M25561 Pain in right knee: Secondary | ICD-10-CM | POA: Diagnosis not present

## 2015-08-16 ENCOUNTER — Telehealth: Payer: Self-pay | Admitting: Oncology

## 2015-08-16 NOTE — Telephone Encounter (Signed)
Patient called in to cancel and reschedule her appointments

## 2015-08-20 ENCOUNTER — Other Ambulatory Visit: Payer: Medicare Other

## 2015-08-20 ENCOUNTER — Ambulatory Visit: Payer: Medicare Other | Admitting: Oncology

## 2015-09-12 DIAGNOSIS — M25561 Pain in right knee: Secondary | ICD-10-CM | POA: Diagnosis not present

## 2015-09-24 ENCOUNTER — Encounter: Payer: Self-pay | Admitting: Family Medicine

## 2015-09-24 ENCOUNTER — Ambulatory Visit (INDEPENDENT_AMBULATORY_CARE_PROVIDER_SITE_OTHER): Payer: Medicare Other | Admitting: Family Medicine

## 2015-09-24 VITALS — BP 130/80 | HR 82 | Temp 98.5°F | Wt 208.6 lb

## 2015-09-24 DIAGNOSIS — R300 Dysuria: Secondary | ICD-10-CM | POA: Diagnosis not present

## 2015-09-24 LAB — URINALYSIS, ROUTINE W REFLEX MICROSCOPIC
KETONES UR: NEGATIVE
LEUKOCYTES UA: NEGATIVE
NITRITE: NEGATIVE
PH: 6 (ref 5.0–8.0)
Specific Gravity, Urine: 1.02 (ref 1.000–1.030)
Urine Glucose: NEGATIVE
Urobilinogen, UA: 1 (ref 0.0–1.0)

## 2015-09-24 LAB — POCT URINALYSIS DIPSTICK
Bilirubin, UA: NEGATIVE
Glucose, UA: NEGATIVE
Ketones, UA: NEGATIVE
LEUKOCYTES UA: NEGATIVE
Nitrite, UA: NEGATIVE
SPEC GRAV UA: 1.025
UROBILINOGEN UA: 1
pH, UA: 6

## 2015-09-24 MED ORDER — SULFAMETHOXAZOLE-TRIMETHOPRIM 800-160 MG PO TABS: 1.0000 | ORAL_TABLET | Freq: Two times a day (BID) | ORAL | Status: DC

## 2015-09-24 NOTE — Progress Notes (Signed)
Pre visit review using our clinic review tool, if applicable. No additional management support is needed unless otherwise documented below in the visit note. 

## 2015-09-24 NOTE — Progress Notes (Signed)
Subjective:    Patient ID: [name] [name], female    DOB: 07/10/1933, 82 y.o.   MRN: [id number]  HPI  [name] [name] is an 82 year old female who presents today with urinary burning, frequency,suprpublic discomfort, and urgency. She denies blood in her urine but states that she has a family history of this which is noted in past urinalysis. She has a history of incontinence which has not changed. She stated that she has had prior UTIs and her symptoms were similar to those today.Three days ago she had 2 episodes of loose stools however this has resolved. Stool was noted to be loose but she states that is was not watery or bloody. She denies nausea, vomiting, chills, fever, and fatigue. Her only other complaint is knee pain that is secondary to knee replacement and she is being followed for this by her orthopedic surgeon. Currently she is taking ibuprofen for knee pain with a RX of prednisone for knee pain.   Review of Systems  Constitutional: Negative for fever, chills and fatigue.  Respiratory: Negative for cough, chest tightness, shortness of breath and wheezing.   Cardiovascular: Negative for chest pain and palpitations.  Gastrointestinal: Negative for nausea, vomiting, diarrhea and blood in stool.       Suprapubic discomfort.   Genitourinary: Positive for dysuria, urgency and frequency. Negative for flank pain.  Musculoskeletal:       Knee discomfort secondary to knee replacement surgery  Skin: Negative for pallor.  Neurological: Negative for dizziness and light-headedness.   Past Medical History  Diagnosis Date  . Anemia     iron deficiency  . Diverticulosis of colon 2004    Dr Deatra Ina  . Hyperlipidemia   . Hypertension   . Vitamin D deficiency   . Vitamin B12 deficiency   . Hematuria     Dr Terance Hart  . Depression 2009    grief  . Osteoarthritis, knee   . Cancer (HCC)     breast, skin  . GERD (gastroesophageal reflux disease)   . Heart murmur   . Acute lymphadenitis of arm       Social History   Social History  . Marital Status: Widowed    Spouse Name: N/A  . Number of Children: N/A  . Years of Education: N/A   Occupational History  . Not on file.   Social History Main Topics  . Smoking status: Never Smoker   . Smokeless tobacco: Not on file  . Alcohol Use: No  . Drug Use: No  . Sexual Activity: Not on file   Other Topics Concern  . Not on file   Social History Narrative    Past Surgical History  Procedure Laterality Date  . Total knee arthroplasty  06-04-98  . Mastectomy  09-13-08    left and right  . L groin skin cancer  2011  . Joint replacement    . Abdominal hysterectomy    . Total knee arthroplasty Right 05/24/2014    Procedure: RIGHT TOTAL KNEE ARTHROPLASTY;  Surgeon: Newt Minion, MD;  Location: [facility];  Service: Orthopedics;  Laterality: Right;    Family History  Problem Relation Age of Onset  . Cancer Sister     breast  . Coronary artery disease Other   . Cancer Other     aunt, niece  . Cancer Maternal Aunt     breast    Allergies  Allergen Reactions  . Anastrozole     REACTION: arthralgias  . Aspirin  Other (See Comments)    Gi upset   . Ciprofloxacin Itching  . Letrozole     REACTION: leg pain    Current Outpatient Prescriptions on File Prior to Visit  Medication Sig Dispense Refill  . acetaminophen (TYLENOL) 500 MG tablet Take 1,000 mg by mouth every 6 (six) hours as needed.     Marland Kitchen amLODipine (NORVASC) 10 MG tablet Take 1 tablet (10 mg total) by mouth daily. 90 tablet 3  . amoxicillin-clavulanate (AUGMENTIN) 875-125 MG per tablet Take 1 tablet by mouth 2 (two) times daily. 20 tablet 0  . cholecalciferol (VITAMIN D) 1000 UNITS tablet Take 1 tablet (1,000 Units total) by mouth daily. 100 tablet 3  . Cyanocobalamin 1000 MCG SUBL Place 1 tablet (1,000 mcg total) under the tongue daily. 100 tablet 11  . diphenhydrAMINE (SOMINEX) 25 MG tablet Take 25 mg by mouth at bedtime as needed for sleep.    . ergocalciferol  (VITAMIN D2) 50000 UNITS capsule Take 1 capsule (50,000 Units total) by mouth once a week. 6 capsule 0  . hydrocortisone 2.5 % ointment Apply topically 2 (two) times daily. 30 g 3  . losartan (COZAAR) 100 MG tablet Take 1 tablet (100 mg total) by mouth daily. 90 tablet 3  . Multiple Vitamin (MULTIVITAMIN WITH MINERALS) TABS tablet Take 1 tablet by mouth daily.    . potassium chloride SA (K-DUR,KLOR-CON) 20 MEQ tablet Take 2 tablets (40 mEq total) by mouth 2 (two) times daily. 120 tablet 3   No current facility-administered medications on file prior to visit.    BP 130/80 mmHg  Pulse 82  Temp(Src) 98.5 F (36.9 C) (Oral)  Wt 208 lb 9.6 oz (94.62 kg)  SpO2 96%       Objective:   Physical Exam  Constitutional: She is oriented to person, place, and time. She appears well-developed and well-nourished.  Cardiovascular: Normal rate and normal heart sounds.   Pulmonary/Chest: Breath sounds normal. She has no wheezes. She has no rales.  Abdominal: Soft. Bowel sounds are normal.  Suprapubic tenderness to palpation  Neurological: She is alert and oriented to person, place, and time.  Skin: Skin is warm and dry.          Assessment & Plan:  Dysuria, frequency, and urgency: Suspect UTI: Urinalysis and Urine Culture ordered. Rx symptoms with Bactrim. Advised patient that urinalysis and urine culture results will be called to her and medication changes will be made if necessary. Loose stools have resolved. Patient did state that both episodes of loose stools occurred before she could get to the bathroom.  Advised patient to increase fluids and drink enough to keep urine pale yellow or clear. Also advised patient to avoid bladder triggers of caffeine, tea, and sodas. Patient instructed to RTC if symptoms do not improve in 3-4 days, worsen, or if she develops fever or back pain. Patient voiced understanding

## 2015-09-24 NOTE — Patient Instructions (Signed)
Please take antibiotic for urinary symptoms as directed and results of urine culture will be called to you. Antibiotic may be discontinued or changed if needed after results are reviewed. Continue with fluids for hydration and urinary symptoms. If symptoms do not improve in 2-3 days, worsen, or you develop a temp >101, please contact office for further follow up. Urinary Tract Infection Urinary tract infections (UTIs) can develop anywhere along your urinary tract. Your urinary tract is your body's drainage system for removing wastes and extra water. Your urinary tract includes two kidneys, two ureters, a bladder, and a urethra. Your kidneys are a pair of bean-shaped organs. Each kidney is about the size of your fist. They are located below your ribs, one on each side of your spine. CAUSES Infections are caused by microbes, which are microscopic organisms, including fungi, viruses, and bacteria. These organisms are so small that they can only be seen through a microscope. Bacteria are the microbes that most commonly cause UTIs. SYMPTOMS  Symptoms of UTIs may vary by age and gender of the patient and by the location of the infection. Symptoms in young women typically include a frequent and intense urge to urinate and a painful, burning feeling in the bladder or urethra during urination. Older women and men are more likely to be tired, shaky, and weak and have muscle aches and abdominal pain. A fever may mean the infection is in your kidneys. Other symptoms of a kidney infection include pain in your back or sides below the ribs, nausea, and vomiting. DIAGNOSIS To diagnose a UTI, your caregiver will ask you about your symptoms. Your caregiver will also ask you to provide a urine sample. The urine sample will be tested for bacteria and white blood cells. White blood cells are made by your body to help fight infection. TREATMENT  Typically, UTIs can be treated with medication. Because most UTIs are caused by a  bacterial infection, they usually can be treated with the use of antibiotics. The choice of antibiotic and length of treatment depend on your symptoms and the type of bacteria causing your infection. HOME CARE INSTRUCTIONS  If you were prescribed antibiotics, take them exactly as your caregiver instructs you. Finish the medication even if you feel better after you have only taken some of the medication.  Drink enough water and fluids to keep your urine clear or pale yellow.  Avoid caffeine, tea, and carbonated beverages. They tend to irritate your bladder.  Empty your bladder often. Avoid holding urine for long periods of time.  Empty your bladder before and after sexual intercourse.  After a bowel movement, women should cleanse from front to back. Use each tissue only once. SEEK MEDICAL CARE IF:   You have back pain.  You develop a fever.  Your symptoms do not begin to resolve within 3 days. SEEK IMMEDIATE MEDICAL CARE IF:   You have severe back pain or lower abdominal pain.  You develop chills.  You have nausea or vomiting.  You have continued burning or discomfort with urination. MAKE SURE YOU:   Understand these instructions.  Will watch your condition.  Will get help right away if you are not doing well or get worse.   This information is not intended to replace advice given to you by your health care provider. Make sure you discuss any questions you have with your health care provider.   Document Released: 05/28/2005 Document Revised: 05/09/2015 Document Reviewed: 09/26/2011 Elsevier Interactive Patient Education [employer].

## 2015-09-26 ENCOUNTER — Telehealth: Payer: Self-pay | Admitting: Internal Medicine

## 2015-09-26 NOTE — Telephone Encounter (Signed)
Gregary Signs is not in the office today. Please see message

## 2015-09-26 NOTE — Telephone Encounter (Addendum)
Patient was in the office Monday and saw Gregary Signs but she says she is not feeling any better.  She want's to know if she can receive something else.

## 2015-09-27 LAB — URINE CULTURE

## 2015-09-27 MED ORDER — NITROFURANTOIN MONOHYD MACRO 100 MG PO CAPS: 100.0000 mg | ORAL_CAPSULE | Freq: Two times a day (BID) | ORAL | Status: DC

## 2015-09-27 NOTE — Telephone Encounter (Signed)
Review of culture results indicate a need to change antibiotic therapy. Nitrofurantoin 100mg  BID prescribed for 7 days. Contacted patient and asked her to stop Bactrim and start nitrofurantoin. Encouraged her to increase water intake and contact clinic for follow up if symptoms do not improve in 2-3 days, worsen, or she develops a fever >101.

## 2015-09-27 NOTE — Telephone Encounter (Signed)
Pt calling in regarding the notes below. She states the medicine that was given is not working at all. She feels terrible She can be reached at [phone]

## 2015-10-01 ENCOUNTER — Ambulatory Visit: Payer: Medicare Other | Admitting: Oncology

## 2015-10-01 ENCOUNTER — Telehealth: Payer: Self-pay | Admitting: *Deleted

## 2015-10-01 ENCOUNTER — Other Ambulatory Visit: Payer: Medicare Other

## 2015-10-01 DIAGNOSIS — N390 Urinary tract infection, site not specified: Secondary | ICD-10-CM

## 2015-10-01 NOTE — Telephone Encounter (Signed)
Notified pt with md response she states still have 3 left then she will come. Also she states she has a fever blister in her lips, she haven;t had a bowel movement since last thurs. She states she has taking 2 stool softener & ducolate laxative, but haven't had a movement yet. Advise pt to give it time to move, and if it doesn't to give md call back...Johny Chess

## 2015-10-01 NOTE — Addendum Note (Signed)
Addended by: Earnstine Regal on: 10/01/2015 12:18 PM   Modules accepted: Orders

## 2015-10-01 NOTE — Telephone Encounter (Signed)
UA and cx pls Sch ROV Thx

## 2015-10-01 NOTE — Telephone Encounter (Signed)
Pt left msg on triage stating was told to contact PCP if not feeling better after completing antibiotic. Pt states she is still not feeling well still having UTI sxs...Johny Chess

## 2015-10-04 ENCOUNTER — Other Ambulatory Visit: Payer: Self-pay | Admitting: Internal Medicine

## 2015-10-04 ENCOUNTER — Ambulatory Visit (INDEPENDENT_AMBULATORY_CARE_PROVIDER_SITE_OTHER): Payer: Medicare Other | Admitting: Internal Medicine

## 2015-10-04 ENCOUNTER — Other Ambulatory Visit (INDEPENDENT_AMBULATORY_CARE_PROVIDER_SITE_OTHER): Payer: Medicare Other

## 2015-10-04 ENCOUNTER — Encounter: Payer: Self-pay | Admitting: Internal Medicine

## 2015-10-04 VITALS — BP 120/70 | HR 72 | Temp 98.1°F | Wt 206.0 lb

## 2015-10-04 DIAGNOSIS — R5383 Other fatigue: Secondary | ICD-10-CM | POA: Diagnosis not present

## 2015-10-04 DIAGNOSIS — E538 Deficiency of other specified B group vitamins: Secondary | ICD-10-CM

## 2015-10-04 DIAGNOSIS — N390 Urinary tract infection, site not specified: Secondary | ICD-10-CM

## 2015-10-04 DIAGNOSIS — G4700 Insomnia, unspecified: Secondary | ICD-10-CM | POA: Diagnosis not present

## 2015-10-04 DIAGNOSIS — R634 Abnormal weight loss: Secondary | ICD-10-CM

## 2015-10-04 DIAGNOSIS — I10 Essential (primary) hypertension: Secondary | ICD-10-CM | POA: Diagnosis not present

## 2015-10-04 DIAGNOSIS — R1032 Left lower quadrant pain: Secondary | ICD-10-CM

## 2015-10-04 DIAGNOSIS — K1120 Sialoadenitis, unspecified: Secondary | ICD-10-CM | POA: Diagnosis not present

## 2015-10-04 LAB — CBC WITH DIFFERENTIAL/PLATELET
BASOS ABS: 0.1 10*3/uL (ref 0.0–0.1)
Basophils Relative: 0.7 % (ref 0.0–3.0)
EOS PCT: 0.7 % (ref 0.0–5.0)
Eosinophils Absolute: 0.1 10*3/uL (ref 0.0–0.7)
HCT: 43 % (ref 36.0–46.0)
HEMOGLOBIN: 14.1 g/dL (ref 12.0–15.0)
Lymphocytes Relative: 54.5 % — ABNORMAL HIGH (ref 12.0–46.0)
Lymphs Abs: 5 10*3/uL — ABNORMAL HIGH (ref 0.7–4.0)
MCHC: 32.9 g/dL (ref 30.0–36.0)
MCV: 85.2 fl (ref 78.0–100.0)
MONO ABS: 0.9 10*3/uL (ref 0.1–1.0)
MONOS PCT: 9.5 % (ref 3.0–12.0)
Neutro Abs: 3.2 10*3/uL (ref 1.4–7.7)
Neutrophils Relative %: 34.6 % — ABNORMAL LOW (ref 43.0–77.0)
Platelets: 322 10*3/uL (ref 150.0–400.0)
RBC: 5.04 Mil/uL (ref 3.87–5.11)
RDW: 14.6 % (ref 11.5–15.5)
WBC: 9.2 10*3/uL (ref 4.0–10.5)

## 2015-10-04 LAB — URINALYSIS, ROUTINE W REFLEX MICROSCOPIC
Bilirubin Urine: NEGATIVE
LEUKOCYTES UA: NEGATIVE
NITRITE: NEGATIVE
Specific Gravity, Urine: 1.025 (ref 1.000–1.030)
TOTAL PROTEIN, URINE-UPE24: 30 — AB
URINE GLUCOSE: NEGATIVE
Urobilinogen, UA: 0.2 (ref 0.0–1.0)
pH: 6 (ref 5.0–8.0)

## 2015-10-04 LAB — BASIC METABOLIC PANEL
BUN: 18 mg/dL (ref 6–23)
CALCIUM: 10.4 mg/dL (ref 8.4–10.5)
CHLORIDE: 98 meq/L (ref 96–112)
CO2: 32 meq/L (ref 19–32)
Creatinine, Ser: 1.13 mg/dL (ref 0.40–1.20)
GFR: 59.48 mL/min — ABNORMAL LOW (ref >60.00)
GLUCOSE: 109 mg/dL — AB (ref 70–99)
Potassium: 3.3 mEq/L — ABNORMAL LOW (ref 3.5–5.1)
SODIUM: 139 meq/L (ref 135–145)

## 2015-10-04 LAB — VITAMIN B12: VITAMIN B 12: 793 pg/mL (ref 211–911)

## 2015-10-04 LAB — HEPATIC FUNCTION PANEL
ALT: 16 U/L (ref 0–35)
AST: 14 U/L (ref 0–37)
Albumin: 4.1 g/dL (ref 3.5–5.2)
Alkaline Phosphatase: 63 U/L (ref 39–117)
BILIRUBIN TOTAL: 0.4 mg/dL (ref 0.2–1.2)
Bilirubin, Direct: 0.1 mg/dL (ref 0.0–0.3)
Total Protein: 7.9 g/dL (ref 6.0–8.3)

## 2015-10-04 LAB — SEDIMENTATION RATE: SED RATE: 22 mm/h (ref 0–22)

## 2015-10-04 MED ORDER — ALIGN 4 MG PO CAPS: 1.0000 | ORAL_CAPSULE | Freq: Every day | ORAL | Status: DC

## 2015-10-04 MED ORDER — POTASSIUM CHLORIDE CRYS ER 20 MEQ PO TBCR: 20.0000 meq | EXTENDED_RELEASE_TABLET | Freq: Two times a day (BID) | ORAL | Status: DC

## 2015-10-04 MED ORDER — AMOXICILLIN-POT CLAVULANATE 875-125 MG PO TABS: 1.0000 | ORAL_TABLET | Freq: Two times a day (BID) | ORAL | Status: DC

## 2015-10-04 NOTE — Assessment & Plan Note (Signed)
Wt Readings from Last 3 Encounters:  10/04/15 206 lb (93.441 kg)  09/24/15 208 lb 9.6 oz (94.62 kg)  05/25/15 213 lb (96.616 kg)  will watch

## 2015-10-04 NOTE — Assessment & Plan Note (Signed)
On B12 

## 2015-10-04 NOTE — Assessment & Plan Note (Signed)
2/16 R submandibular salivary gland is swollen and tender Labs Augmentin, Align

## 2015-10-04 NOTE — Progress Notes (Signed)
Subjective:  Patient ID: [name] [name], female    DOB: Dec 31, 1932  Age: 82 y.o. MRN: [id number]  CC: No chief complaint on file.   HPI [name] [name] presents for fatigue and neck and R jaw painful swelling x 2 weeks - not better. Pt was treated for a UTI w/Macrobid. Dr Sharol Given treated pt w/Medrol dosepac 3 wks ago  Outpatient Prescriptions Prior to Visit  Medication Sig Dispense Refill  . acetaminophen (TYLENOL) 500 MG tablet Take 1,000 mg by mouth every 6 (six) hours as needed.     Marland Kitchen amLODipine (NORVASC) 10 MG tablet Take 1 tablet (10 mg total) by mouth daily. 90 tablet 3  . cholecalciferol (VITAMIN D) 1000 UNITS tablet Take 1 tablet (1,000 Units total) by mouth daily. 100 tablet 3  . diphenhydrAMINE (SOMINEX) 25 MG tablet Take 25 mg by mouth at bedtime as needed for sleep.    . ergocalciferol (VITAMIN D2) 50000 UNITS capsule Take 1 capsule (50,000 Units total) by mouth once a week. 6 capsule 0  . hydrocortisone 2.5 % ointment Apply topically 2 (two) times daily. 30 g 3  . ibuprofen (ADVIL,MOTRIN) 600 MG tablet Take 600 mg by mouth every 6 (six) hours as needed.    Marland Kitchen losartan (COZAAR) 100 MG tablet Take 1 tablet (100 mg total) by mouth daily. 90 tablet 3  . Multiple Vitamin (MULTIVITAMIN WITH MINERALS) TABS tablet Take 1 tablet by mouth daily.    . potassium chloride SA (K-DUR,KLOR-CON) 20 MEQ tablet Take 2 tablets (40 mEq total) by mouth 2 (two) times daily. 120 tablet 3  . amoxicillin-clavulanate (AUGMENTIN) 875-125 MG per tablet Take 1 tablet by mouth 2 (two) times daily. 20 tablet 0  . Cyanocobalamin 1000 MCG SUBL Place 1 tablet (1,000 mcg total) under the tongue daily. 100 tablet 11  . nitrofurantoin, macrocrystal-monohydrate, (MACROBID) 100 MG capsule Take 1 capsule (100 mg total) by mouth 2 (two) times daily. 14 capsule 0  . predniSONE (DELTASONE) 10 MG tablet Take 10 mg by mouth daily with breakfast.    . sulfamethoxazole-trimethoprim (BACTRIM DS,SEPTRA DS) 800-160 MG tablet Take  1 tablet by mouth 2 (two) times daily. 14 tablet 0   No facility-administered medications prior to visit.    ROS Review of Systems  Constitutional: Positive for chills and fatigue. Negative for activity change, appetite change and unexpected weight change.  HENT: Negative for congestion, mouth sores and sinus pressure.   Eyes: Negative for visual disturbance.  Respiratory: Negative for cough and chest tightness.   Gastrointestinal: Negative for nausea and abdominal pain.  Genitourinary: Negative for frequency, difficulty urinating and vaginal pain.  Musculoskeletal: Positive for neck pain. Negative for back pain and gait problem.  Skin: Negative for pallor, rash and wound.  Neurological: Negative for dizziness, tremors, weakness, numbness and headaches.  Psychiatric/Behavioral: Negative for confusion and sleep disturbance. The patient is not nervous/anxious.     Objective:  BP 120/70 mmHg  Pulse 72  Temp(Src) 98.1 F (36.7 C) (Oral)  Wt 206 lb (93.441 kg)  SpO2 98%  BP Readings from Last 3 Encounters:  10/04/15 120/70  09/24/15 130/80  05/25/15 138/74    Wt Readings from Last 3 Encounters:  10/04/15 206 lb (93.441 kg)  09/24/15 208 lb 9.6 oz (94.62 kg)  05/25/15 213 lb (96.616 kg)    Physical Exam  Constitutional: She appears well-developed. No distress.  HENT:  Head: Normocephalic.  Right Ear: External ear normal.  Left Ear: External ear normal.  Nose: Nose normal.  Mouth/Throat: Oropharynx is clear and moist.  Eyes: Conjunctivae are normal. Pupils are equal, round, and reactive to light. Right eye exhibits no discharge. Left eye exhibits no discharge.  Neck: Normal range of motion. Neck supple. No JVD present. No tracheal deviation present. No thyromegaly present.  Cardiovascular: Normal rate, regular rhythm and normal heart sounds.   Pulmonary/Chest: No stridor. No respiratory distress. She has no wheezes.  Abdominal: Soft. Bowel sounds are normal. She exhibits  no distension and no mass. There is no tenderness. There is no rebound and no guarding.  Musculoskeletal: She exhibits no edema or tenderness.  Lymphadenopathy:    She has cervical adenopathy.  Neurological: She displays normal reflexes. No cranial nerve deficit. She exhibits normal muscle tone. Coordination normal.  Skin: No rash noted. No erythema.  Psychiatric: She has a normal mood and affect. Her behavior is normal. Judgment and thought content normal.   R submandibular salivary gland is swollen and tender  Lab Results  Component Value Date   WBC 9.2 10/04/2015   HGB 14.1 10/04/2015   HCT 43.0 10/04/2015   PLT 322.0 10/04/2015   GLUCOSE 109* 10/04/2015   ALT 16 10/04/2015   AST 14 10/04/2015   NA 139 10/04/2015   K 3.3* 10/04/2015   CL 98 10/04/2015   CREATININE 1.13 10/04/2015   BUN 18 10/04/2015   CO2 32 10/04/2015   TSH 1.16 04/23/2015   INR 1.67* 05/27/2014   HGBA1C 5.6 12/29/2014    Dg Bone Density  10/04/2014  CLINICAL DATA:  82 year old postmenopausal female. EXAM: DUAL X-RAY ABSORPTIOMETRY (DXA) FOR BONE MINERAL DENSITY FINDINGS: LEFT FEMUR NECK Bone Mineral Density (BMD):  0.941 g/cm2 Young Adult T-Score: 0.8 Z-Score:  1.7 LEFT FOREARM (1/3 RADIUS) Bone Mineral Density (BMD):  0.775 Young Adult T Score:  1.3 Z Score:  4.4 ASSESSMENT: Patient's diagnostic category is normal by WHO Criteria. FRACTURE RISK: Not increased COMPARISON: When compared to baseline evaluation 08/20/2007 there has been a statistically significant interval decrease in bone mineral density of the left hip of 3.4%. When compared to prior evaluation 01/06/2011 there has been a statistically significant interval increase in bone mineral density of the left forearm of 4.2% and a statistically significant interval decrease in bone mineral density of the left hip of 3.4%. Effective therapies are available in the form of bisphosphonates, selective estrogen receptor modulators, biologic agents, and hormone  replacement therapy (for women). All patients should ensure an adequate intake of dietary calcium (1200 mg daily) and vitamin D (800 IU daily) unless contraindicated. All treatment decisions require clinical judgment and consideration of individual patient factors, including patient preferences, co-morbidities, previous drug use, risk factors not captured in the FRAX model (e.g., frailty, falls, vitamin D deficiency, increased bone turnover, interval significant decline in bone density) and possible under- or over-estimation of fracture risk by FRAX. The National Osteoporosis Foundation recommends that FDA-approved medical therapies be considered in postmenopausal women and men age 48 or older with a: 1. Hip or vertebral (clinical or morphometric) fracture. 2. T-score of -2.5 or lower at the spine or hip. 3. Ten-year fracture probability by FRAX of 3% or greater for hip fracture or 20% or greater for major osteoporotic fracture. People with diagnosed cases of osteoporosis or at high risk for fracture should have regular bone mineral density tests. For patients eligible for Medicare, routine testing is allowed once every 2 years. The testing frequency can be increased to one year for patients who have rapidly progressing disease, those who are receiving  or discontinuing medical therapy to restore bone mass, or have additional risk factors. [facility] [employer] [facility]) Criteria: Normal: T-scores from +1.0 to -1.0 Low Bone Mass (Osteopenia): T-scores between -1.0 and -2.5 Osteoporosis: T-scores -2.5 and below Comparison to Reference Population: T-score is the key measure used in the diagnosis of osteoporosis and relative risk determination for fracture. It provides a value for bone mass relative to the mean bone mass of a young adult reference population expressed in terms of standard deviation (SD). Z-score is the age-matched score showing the patient's values compared to a population matched for age, sex, and  race. This is also expressed in terms of standard deviation. The patient may have values that compare favorably to the age-matched values and still be at increased risk for fracture. Electronically Signed   By: Lovey Newcomer M.D.   On: 10/04/2014 18:28    Assessment & Plan:   Diagnoses and all orders for this visit:  Sialoadenitis -     CBC with Differential/Platelet; Future -     Basic metabolic panel; Future -     Sedimentation rate; Future  Other fatigue  Essential hypertension  B12 deficiency  WEIGHT LOSS, ABNORMAL  Other orders -     amoxicillin-clavulanate (AUGMENTIN) 875-125 MG tablet; Take 1 tablet by mouth 2 (two) times daily. -     Probiotic Product (ALIGN) 4 MG CAPS; Take 1 capsule (4 mg total) by mouth daily.   I have discontinued Ms. Meschke's Cyanocobalamin, amoxicillin-clavulanate, predniSONE, sulfamethoxazole-trimethoprim, nitrofurantoin (macrocrystal-monohydrate), and predniSONE. I am also having her start on amoxicillin-clavulanate and ALIGN. Additionally, I am having her maintain her acetaminophen, multivitamin with minerals, diphenhydrAMINE, potassium chloride SA, amLODipine, losartan, ergocalciferol, cholecalciferol, hydrocortisone, ibuprofen, and [facility] B-12.  Meds ordered this encounter  Medications  . DISCONTD: predniSONE (STERAPRED UNI-PAK 48 TAB) 10 MG (48) TBPK tablet    Sig: USE PER PACKAGE    Refill:  0  . [facility] B-12 500 MCG SUBL    Sig: PLACE 2 TABLETS UNDER THE TONGUE DAILY    Refill:  11  . amoxicillin-clavulanate (AUGMENTIN) 875-125 MG tablet    Sig: Take 1 tablet by mouth 2 (two) times daily.    Dispense:  20 tablet    Refill:  0  . Probiotic Product (ALIGN) 4 MG CAPS    Sig: Take 1 capsule (4 mg total) by mouth daily.    Dispense:  30 capsule    Refill:  0     Follow-up: No Follow-up on file.  Walker Kehr, MD

## 2015-10-04 NOTE — Assessment & Plan Note (Signed)
Chronic  Losartan, Amlodipine 

## 2015-10-04 NOTE — Assessment & Plan Note (Signed)
Labs Treat infection

## 2015-10-04 NOTE — Patient Instructions (Signed)
Salivary Gland Infection °A salivary gland infection is an infection in one or more of the glands that produce spit (saliva). You have six major salivary glands. Each gland has a duct that carries saliva into your mouth. Saliva keeps your mouth moist and breaks down the food that you eat. It also helps to prevent tooth decay. °Two salivary glands are located just in front of your ears (parotid). The ducts for these glands open up inside your cheeks, near your back teeth. You also have two glands under your tongue (sublingual) and two glands under your jaw (submandibular). The ducts for these glands open under your tongue. Any salivary gland can become infected. Most infections occur in the parotid glands or submandibular glands. °CAUSES °Salivary glands can be infected by viruses or bacteria. °· The mumps virus is the most common cause of viral salivary gland infections, though mumps is now rare in many areas because of vaccination. °¨ This infection causes swelling in both parotid glands. °¨ Viral infections are more common in children. °· The bacteria that cause salivary gland infections are usually the same bacteria that normally live in your mouth. °¨ A stone can form in a salivary gland and block the flow of saliva. As a result, saliva backs up into the salivary gland. Bacteria may then start to grow behind the blockage and cause infection. °¨ Bacterial infections usually cause pain and swelling on one side of the face. Submandibular gland swelling occurs under the jaw. Parotid swelling occurs in front of the ear. °¨ Bacterial infections are more common in adults. °RISK FACTORS °Children who do not get the MMR (measles, mumps, rubella) vaccine are more likely to get mumps, which can cause a viral salivary gland infection. °Risk factors for bacterial infections include: °· Poor dental care (oral hygiene). °· Smoking. °· Not drinking enough water. °· Having a disease that causes dry mouth and dry eyes (Mikulicz  syndrome or Sjogren syndrome). °SIGNS AND SYMPTOMS °The main sign of salivary gland infection is a swollen salivary gland. This type of inflammation is often called sialadenitis. You may have swelling in front of your ear, under your jaw, or under your tongue. Swelling may get worse when you eat and decrease after you eat. Other signs and symptoms include: °· Pain. °· Tenderness. °· Redness. °· Dry mouth. °· Bad taste in your mouth. °· Difficulty chewing and swallowing. °· Fever. °DIAGNOSIS °Your health care provider may suspect a salivary gland infection based on your signs and symptoms. He or she will also do a physical exam. The health care provider will look and feel inside your mouth to see whether a stone is blocking a salivary gland duct. You may need to see an ear, nose, and throat specialist (ENT or otolaryngologist) for diagnosis and treatment. You may also need to have diagnostic tests, such as: °· An X-ray to check for a stone. °· Other imaging studies to look for an abscess and to rule out other causes of swelling. These tests may include: °¨ Ultrasound. °¨ CT scan. °¨ MRI. °· Culture and sensitivity test. This involves collecting a sample of pus for testing in the lab to see what bacteria grow and what antibiotics they are sensitive to. The testing sample may be: °¨ Swabbed from a salivary gland duct. °¨ Withdrawn from a swollen gland with a needle (aspiration). °TREATMENT °Viral salivary gland infections usually clear up without treatment. Bacterial infections are usually treated with antibiotic medicine. Severe infections that cause difficulty with swallowing may   be treated with an IV antibiotic in the hospital. Other treatments may include:  Probing and widening the salivary duct to allow a stone to pass. In some cases, a thin, flexible scope (endoscope) may be inserted into the duct to find a stone and remove it.  Breaking up a stone using sound waves.  Draining an infected gland (abscess)  with a needle.  In some cases, you may need surgery so your health care provider can:  Remove a stone.  Drain pus from an abscess.  Remove a badly infected gland. HOME CARE INSTRUCTIONS  Take medicines only as directed by your health care provider.  If you were prescribed an antibiotic medicine, finish it all even if you start to feel better.  Follow these instructions every few hours:  Suck on a lemon candy to stimulate the flow of saliva.  Put a warm compress over the gland.  Gently massage the gland.  Drink enough fluid to keep your urine clear or pale yellow.  Rinse your mouth with a mixture of warm water and salt every few hours. To make this mixture, add a pinch of salt to 1 cup of warm water.  Practice good oral hygiene by brushing and flossing your teeth after meals and before you go to bed.  Do not use any tobacco products, including cigarettes, chewing tobacco, or electronic cigarettes. If you need help quitting, ask your health care provider. SEEK MEDICAL CARE IF:  You have pain and swelling in your face, jaw, or mouth after eating.  You have persistent swelling in any of these places:  In front of your ear.  Under your jaw.  Inside your mouth. SEEK IMMEDIATE MEDICAL CARE IF:   You have pain and swelling in your face, jaw, or mouth that are getting worse.  Your pain and swelling make it hard to swallow or breathe.   This information is not intended to replace advice given to you by your health care provider. Make sure you discuss any questions you have with your health care provider.   Document Released: 09/25/2004 Document Revised: 09/08/2014 Document Reviewed: 01/18/2014 Elsevier Interactive Patient Education [employer].

## 2015-10-04 NOTE — Progress Notes (Signed)
Pre visit review using our clinic review tool, if applicable. No additional management support is needed unless otherwise documented below in the visit note. 

## 2015-10-08 ENCOUNTER — Other Ambulatory Visit: Payer: Medicare Other

## 2015-10-08 ENCOUNTER — Ambulatory Visit: Payer: Medicare Other | Admitting: Oncology

## 2015-10-09 ENCOUNTER — Telehealth: Payer: Self-pay | Admitting: Internal Medicine

## 2015-10-09 NOTE — Telephone Encounter (Signed)
Pt request to speak to the assistant (only) concern about the lab result. Please call her back

## 2015-10-09 NOTE — Telephone Encounter (Signed)
Left mess for patient to call back.  

## 2015-10-10 NOTE — Telephone Encounter (Signed)
Pt has returned your call. Please call her back

## 2015-10-11 NOTE — Telephone Encounter (Signed)
Please advise, thanks.

## 2015-10-11 NOTE — Telephone Encounter (Signed)
Patient called to follow up. To clarify, patient states that she is still experiencing stiffness and her taste buds are "still not back". She is down to her last 4 pills. She states that she will be availabnle all day long and requests your call

## 2015-10-11 NOTE — Telephone Encounter (Signed)
Patient advised of dr plots note, patient has scheduled appt to see him tomorrow also

## 2015-10-11 NOTE — Telephone Encounter (Signed)
Pls finish Rx and give it a few more days Thx

## 2015-10-12 ENCOUNTER — Encounter: Payer: Self-pay | Admitting: Internal Medicine

## 2015-10-12 ENCOUNTER — Ambulatory Visit (INDEPENDENT_AMBULATORY_CARE_PROVIDER_SITE_OTHER): Payer: Medicare Other | Admitting: Internal Medicine

## 2015-10-12 VITALS — BP 150/90 | HR 83 | Temp 98.3°F | Ht 66.0 in | Wt 212.0 lb

## 2015-10-12 DIAGNOSIS — R29810 Facial weakness: Secondary | ICD-10-CM | POA: Diagnosis not present

## 2015-10-12 DIAGNOSIS — E538 Deficiency of other specified B group vitamins: Secondary | ICD-10-CM

## 2015-10-12 DIAGNOSIS — E876 Hypokalemia: Secondary | ICD-10-CM

## 2015-10-12 DIAGNOSIS — R5383 Other fatigue: Secondary | ICD-10-CM | POA: Diagnosis not present

## 2015-10-12 MED ORDER — ACYCLOVIR 800 MG PO TABS: 800.0000 mg | ORAL_TABLET | Freq: Four times a day (QID) | ORAL | Status: DC

## 2015-10-12 NOTE — Assessment & Plan Note (Addendum)
2/17 R ?etiology Empiric acyclovir Brain MRI To ER if worse

## 2015-10-12 NOTE — Progress Notes (Signed)
Subjective:  Patient ID: [name] [name], female    DOB: Oct 05, 1932  Age: 82 y.o. MRN: [id number]  CC: No chief complaint on file.   HPI [name] [name] presents for a f/u. She is here w/her son [name] [name]. C/o R facial droop and a change of taste. C/o fatigue Pt is finishing Prednisone (Dr Sharol Given)   Outpatient Prescriptions Prior to Visit  Medication Sig Dispense Refill  . acetaminophen (TYLENOL) 500 MG tablet Take 1,000 mg by mouth every 6 (six) hours as needed.     Marland Kitchen amLODipine (NORVASC) 10 MG tablet Take 1 tablet (10 mg total) by mouth daily. 90 tablet 3  . cholecalciferol (VITAMIN D) 1000 UNITS tablet Take 1 tablet (1,000 Units total) by mouth daily. 100 tablet 3  . [facility] B-12 500 MCG SUBL PLACE 2 TABLETS UNDER THE TONGUE DAILY  11  . diphenhydrAMINE (SOMINEX) 25 MG tablet Take 25 mg by mouth at bedtime as needed for sleep.    . ergocalciferol (VITAMIN D2) 50000 UNITS capsule Take 1 capsule (50,000 Units total) by mouth once a week. 6 capsule 0  . hydrocortisone 2.5 % ointment Apply topically 2 (two) times daily. 30 g 3  . losartan (COZAAR) 100 MG tablet Take 1 tablet (100 mg total) by mouth daily. 90 tablet 3  . Multiple Vitamin (MULTIVITAMIN WITH MINERALS) TABS tablet Take 1 tablet by mouth daily.    . potassium chloride SA (K-DUR,KLOR-CON) 20 MEQ tablet Take 1 tablet (20 mEq total) by mouth 2 (two) times daily. 60 tablet 3  . Probiotic Product (ALIGN) 4 MG CAPS Take 1 capsule (4 mg total) by mouth daily. 30 capsule 0  . amoxicillin-clavulanate (AUGMENTIN) 875-125 MG tablet Take 1 tablet by mouth 2 (two) times daily. 20 tablet 0  . ibuprofen (ADVIL,MOTRIN) 600 MG tablet Take 600 mg by mouth every 6 (six) hours as needed.     No facility-administered medications prior to visit.    ROS Review of Systems  Constitutional: Positive for fatigue. Negative for chills, activity change, appetite change and unexpected weight change.  HENT: Negative for congestion, mouth sores and sinus  pressure.   Eyes: Negative for visual disturbance.  Respiratory: Negative for cough and chest tightness.   Cardiovascular: Negative for leg swelling.  Gastrointestinal: Positive for nausea. Negative for abdominal pain and diarrhea.  Genitourinary: Negative for frequency, difficulty urinating and vaginal pain.  Musculoskeletal: Negative for back pain and gait problem.  Skin: Negative for pallor and rash.  Neurological: Positive for weakness. Negative for dizziness, tremors, numbness and headaches.  Psychiatric/Behavioral: Negative for suicidal ideas, confusion, sleep disturbance and dysphoric mood. The patient is nervous/anxious.     Objective:  BP 150/90 mmHg  Pulse 83  Temp(Src) 98.3 F (36.8 C) (Oral)  Ht 5\' 6"  (1.676 m)  Wt 212 lb (96.163 kg)  BMI 34.23 kg/m2  SpO2 94%  BP Readings from Last 3 Encounters:  10/12/15 150/90  10/04/15 120/70  09/24/15 130/80    Wt Readings from Last 3 Encounters:  10/12/15 212 lb (96.163 kg)  10/04/15 206 lb (93.441 kg)  09/24/15 208 lb 9.6 oz (94.62 kg)    Physical Exam  Constitutional: She appears well-developed. No distress.  HENT:  Head: Normocephalic.  Right Ear: External ear normal.  Left Ear: External ear normal.  Nose: Nose normal.  Mouth/Throat: Oropharynx is clear and moist.  Eyes: Conjunctivae are normal. Pupils are equal, round, and reactive to light. Right eye exhibits no discharge. Left eye exhibits no discharge.  Neck: Normal range of motion. Neck supple. No JVD present. No tracheal deviation present. No thyromegaly present.  Cardiovascular: Normal rate, regular rhythm and normal heart sounds.   Pulmonary/Chest: No stridor. No respiratory distress. She has no wheezes.  Abdominal: Soft. Bowel sounds are normal. She exhibits no distension and no mass. There is no tenderness. There is no rebound and no guarding.  Musculoskeletal: She exhibits no edema or tenderness.  Lymphadenopathy:    She has no cervical adenopathy.    Neurological: She displays normal reflexes. A cranial nerve deficit is present. She exhibits normal muscle tone. Coordination normal.  Skin: No rash noted. No erythema.  Psychiatric: She has a normal mood and affect. Her behavior is normal. Judgment and thought content normal.  Mild R facial droop R neck sensitive. No mass, no LNs   Lab Results  Component Value Date   WBC 9.2 10/04/2015   HGB 14.1 10/04/2015   HCT 43.0 10/04/2015   PLT 322.0 10/04/2015   GLUCOSE 109* 10/04/2015   ALT 16 10/04/2015   AST 14 10/04/2015   NA 139 10/04/2015   K 3.3* 10/04/2015   CL 98 10/04/2015   CREATININE 1.13 10/04/2015   BUN 18 10/04/2015   CO2 32 10/04/2015   TSH 1.16 04/23/2015   INR 1.67* 05/27/2014   HGBA1C 5.6 12/29/2014    Dg Bone Density  10/04/2014  CLINICAL DATA:  82 year old postmenopausal female. EXAM: DUAL X-RAY ABSORPTIOMETRY (DXA) FOR BONE MINERAL DENSITY FINDINGS: LEFT FEMUR NECK Bone Mineral Density (BMD):  0.941 g/cm2 Young Adult T-Score: 0.8 Z-Score:  1.7 LEFT FOREARM (1/3 RADIUS) Bone Mineral Density (BMD):  0.775 Young Adult T Score:  1.3 Z Score:  4.4 ASSESSMENT: Patient's diagnostic category is normal by WHO Criteria. FRACTURE RISK: Not increased COMPARISON: When compared to baseline evaluation 08/20/2007 there has been a statistically significant interval decrease in bone mineral density of the left hip of 3.4%. When compared to prior evaluation 01/06/2011 there has been a statistically significant interval increase in bone mineral density of the left forearm of 4.2% and a statistically significant interval decrease in bone mineral density of the left hip of 3.4%. Effective therapies are available in the form of bisphosphonates, selective estrogen receptor modulators, biologic agents, and hormone replacement therapy (for women). All patients should ensure an adequate intake of dietary calcium (1200 mg daily) and vitamin D (800 IU daily) unless contraindicated. All treatment  decisions require clinical judgment and consideration of individual patient factors, including patient preferences, co-morbidities, previous drug use, risk factors not captured in the FRAX model (e.g., frailty, falls, vitamin D deficiency, increased bone turnover, interval significant decline in bone density) and possible under- or over-estimation of fracture risk by FRAX. The National Osteoporosis Foundation recommends that FDA-approved medical therapies be considered in postmenopausal women and men age 65 or older with a: 1. Hip or vertebral (clinical or morphometric) fracture. 2. T-score of -2.5 or lower at the spine or hip. 3. Ten-year fracture probability by FRAX of 3% or greater for hip fracture or 20% or greater for major osteoporotic fracture. People with diagnosed cases of osteoporosis or at high risk for fracture should have regular bone mineral density tests. For patients eligible for Medicare, routine testing is allowed once every 2 years. The testing frequency can be increased to one year for patients who have rapidly progressing disease, those who are receiving or discontinuing medical therapy to restore bone mass, or have additional risk factors. World Health Organization [facility]) Criteria: Normal: T-scores from +1.0 to -  1.0 Low Bone Mass (Osteopenia): T-scores between -1.0 and -2.5 Osteoporosis: T-scores -2.5 and below Comparison to Reference Population: T-score is the key measure used in the diagnosis of osteoporosis and relative risk determination for fracture. It provides a value for bone mass relative to the mean bone mass of a young adult reference population expressed in terms of standard deviation (SD). Z-score is the age-matched score showing the patient's values compared to a population matched for age, sex, and race. This is also expressed in terms of standard deviation. The patient may have values that compare favorably to the age-matched values and still be at increased risk for fracture.  Electronically Signed   By: Lovey Newcomer M.D.   On: 10/04/2014 18:28    Assessment & Plan:   Diagnoses and all orders for this visit:  Mouth droop due to facial weakness -     MR Brain W Contrast; Future  Other orders -     acyclovir (ZOVIRAX) 800 MG tablet; Take 1 tablet (800 mg total) by mouth 4 (four) times daily.   I have discontinued Ms. Hanger's ibuprofen and amoxicillin-clavulanate. I am also having her start on acyclovir. Additionally, I am having her maintain her acetaminophen, multivitamin with minerals, diphenhydrAMINE, amLODipine, losartan, ergocalciferol, cholecalciferol, hydrocortisone, CVS B-12, ALIGN, and potassium chloride SA.  Meds ordered this encounter  Medications  . acyclovir (ZOVIRAX) 800 MG tablet    Sig: Take 1 tablet (800 mg total) by mouth 4 (four) times daily.    Dispense:  35 tablet    Refill:  1     Follow-up: Return in about 1 week (around 10/19/2015) for a follow-up visit.  Walker Kehr, MD

## 2015-10-12 NOTE — Progress Notes (Signed)
Pre visit review using our clinic review tool, if applicable. No additional management support is needed unless otherwise documented below in the visit note. 

## 2015-10-12 NOTE — Patient Instructions (Signed)
Go to ER if worse 

## 2015-10-12 NOTE — Telephone Encounter (Signed)
Seen today. 

## 2015-10-12 NOTE — Assessment & Plan Note (Signed)
KCl po 

## 2015-10-12 NOTE — Assessment & Plan Note (Signed)
Labs reviewed Brain MRI due to facial droop

## 2015-10-12 NOTE — Assessment & Plan Note (Signed)
On B12 

## 2015-10-22 ENCOUNTER — Ambulatory Visit (INDEPENDENT_AMBULATORY_CARE_PROVIDER_SITE_OTHER): Payer: Medicare Other | Admitting: Internal Medicine

## 2015-10-22 ENCOUNTER — Encounter: Payer: Self-pay | Admitting: Internal Medicine

## 2015-10-22 VITALS — BP 136/80 | HR 74 | Wt 210.0 lb

## 2015-10-22 DIAGNOSIS — G510 Bell's palsy: Secondary | ICD-10-CM

## 2015-10-22 DIAGNOSIS — J029 Acute pharyngitis, unspecified: Secondary | ICD-10-CM | POA: Diagnosis not present

## 2015-10-22 DIAGNOSIS — R112 Nausea with vomiting, unspecified: Secondary | ICD-10-CM

## 2015-10-22 DIAGNOSIS — R432 Parageusia: Secondary | ICD-10-CM | POA: Insufficient documentation

## 2015-10-22 NOTE — Progress Notes (Signed)
Pre visit review using our clinic review tool, if applicable. No additional management support is needed unless otherwise documented below in the visit note. 

## 2015-10-22 NOTE — Assessment & Plan Note (Signed)
2/17 due to [facility] vs other MRI pending

## 2015-10-22 NOTE — Assessment & Plan Note (Signed)
Resolved

## 2015-10-22 NOTE — Assessment & Plan Note (Signed)
2/17 R vs other Clinically better  MRI ordered

## 2015-10-22 NOTE — Progress Notes (Signed)
Subjective:  Patient ID: [name] [name], female    DOB: 10/17/1932  Age: 82 y.o. MRN: [id number]  CC: No chief complaint on file.   HPI [name] [name] presents for R facial droop, lack of sense of taste. C/o hypersalivation. Weakness and pain are better. C/o some ST on the R. Overall - much better  Outpatient Prescriptions Prior to Visit  Medication Sig Dispense Refill  . acetaminophen (TYLENOL) 500 MG tablet Take 1,000 mg by mouth every 6 (six) hours as needed.     Marland Kitchen acyclovir (ZOVIRAX) 800 MG tablet Take 1 tablet (800 mg total) by mouth 4 (four) times daily. 35 tablet 1  . amLODipine (NORVASC) 10 MG tablet Take 1 tablet (10 mg total) by mouth daily. 90 tablet 3  . cholecalciferol (VITAMIN D) 1000 UNITS tablet Take 1 tablet (1,000 Units total) by mouth daily. 100 tablet 3  . CVS B-12 500 MCG SUBL PLACE 2 TABLETS UNDER THE TONGUE DAILY  11  . diphenhydrAMINE (SOMINEX) 25 MG tablet Take 25 mg by mouth at bedtime as needed for sleep.    . ergocalciferol (VITAMIN D2) 50000 UNITS capsule Take 1 capsule (50,000 Units total) by mouth once a week. 6 capsule 0  . hydrocortisone 2.5 % ointment Apply topically 2 (two) times daily. 30 g 3  . losartan (COZAAR) 100 MG tablet Take 1 tablet (100 mg total) by mouth daily. 90 tablet 3  . Multiple Vitamin (MULTIVITAMIN WITH MINERALS) TABS tablet Take 1 tablet by mouth daily.    . potassium chloride SA (K-DUR,KLOR-CON) 20 MEQ tablet Take 1 tablet (20 mEq total) by mouth 2 (two) times daily. 60 tablet 3  . Probiotic Product (ALIGN) 4 MG CAPS Take 1 capsule (4 mg total) by mouth daily. 30 capsule 0   No facility-administered medications prior to visit.    ROS Review of Systems  Constitutional: Negative for fever, chills, activity change, appetite change, fatigue and unexpected weight change.  HENT: Negative for congestion, mouth sores and sinus pressure.   Eyes: Negative for visual disturbance.  Respiratory: Negative for cough and chest tightness.     Gastrointestinal: Negative for nausea and abdominal pain.  Genitourinary: Negative for frequency, difficulty urinating and vaginal pain.  Musculoskeletal: Negative for back pain and gait problem.  Skin: Negative for pallor and rash.  Neurological: Positive for facial asymmetry. Negative for dizziness, tremors, speech difficulty, weakness, numbness and headaches.  Psychiatric/Behavioral: Negative for confusion and sleep disturbance.    Objective:  BP 136/80 mmHg  Pulse 74  Wt 210 lb (95.255 kg)  SpO2 94%  BP Readings from Last 3 Encounters:  10/22/15 136/80  10/12/15 150/90  10/04/15 120/70    Wt Readings from Last 3 Encounters:  10/22/15 210 lb (95.255 kg)  10/12/15 212 lb (96.163 kg)  10/04/15 206 lb (93.441 kg)    Physical Exam  Constitutional: She appears well-developed. No distress.  HENT:  Head: Normocephalic.  Right Ear: External ear normal.  Left Ear: External ear normal.  Nose: Nose normal.  Mouth/Throat: Oropharynx is clear and moist.  Eyes: Conjunctivae are normal. Pupils are equal, round, and reactive to light. Right eye exhibits no discharge. Left eye exhibits no discharge.  Neck: Normal range of motion. Neck supple. No JVD present. No tracheal deviation present. No thyromegaly present.  Cardiovascular: Normal rate, regular rhythm and normal heart sounds.   Pulmonary/Chest: No stridor. No respiratory distress. She has no wheezes.  Abdominal: Soft. Bowel sounds are normal. She exhibits no distension and  no mass. There is no tenderness. There is no rebound and no guarding.  Musculoskeletal: She exhibits no edema or tenderness.  Lymphadenopathy:    She has no cervical adenopathy.  Neurological: She displays normal reflexes. A cranial nerve deficit is present. She exhibits normal muscle tone. Coordination abnormal.  Skin: No rash noted. No erythema.  Psychiatric: She has a normal mood and affect. Her behavior is normal. Judgment and thought content normal.  Mild  R upper lip drop R neck sensitive; no mass Cane  Lab Results  Component Value Date   WBC 9.2 10/04/2015   HGB 14.1 10/04/2015   HCT 43.0 10/04/2015   PLT 322.0 10/04/2015   GLUCOSE 109* 10/04/2015   ALT 16 10/04/2015   AST 14 10/04/2015   NA 139 10/04/2015   K 3.3* 10/04/2015   CL 98 10/04/2015   CREATININE 1.13 10/04/2015   BUN 18 10/04/2015   CO2 32 10/04/2015   TSH 1.16 04/23/2015   INR 1.67* 05/27/2014   HGBA1C 5.6 12/29/2014    Dg Bone Density  10/04/2014  CLINICAL DATA:  82 year old postmenopausal female. EXAM: DUAL X-RAY ABSORPTIOMETRY (DXA) FOR BONE MINERAL DENSITY FINDINGS: LEFT FEMUR NECK Bone Mineral Density (BMD):  0.941 g/cm2 Young Adult T-Score: 0.8 Z-Score:  1.7 LEFT FOREARM (1/3 RADIUS) Bone Mineral Density (BMD):  0.775 Young Adult T Score:  1.3 Z Score:  4.4 ASSESSMENT: Patient's diagnostic category is normal by WHO Criteria. FRACTURE RISK: Not increased COMPARISON: When compared to baseline evaluation 08/20/2007 there has been a statistically significant interval decrease in bone mineral density of the left hip of 3.4%. When compared to prior evaluation 01/06/2011 there has been a statistically significant interval increase in bone mineral density of the left forearm of 4.2% and a statistically significant interval decrease in bone mineral density of the left hip of 3.4%. Effective therapies are available in the form of bisphosphonates, selective estrogen receptor modulators, biologic agents, and hormone replacement therapy (for women). All patients should ensure an adequate intake of dietary calcium (1200 mg daily) and vitamin D (800 IU daily) unless contraindicated. All treatment decisions require clinical judgment and consideration of individual patient factors, including patient preferences, co-morbidities, previous drug use, risk factors not captured in the FRAX model (e.g., frailty, falls, vitamin D deficiency, increased bone turnover, interval significant decline in  bone density) and possible under- or over-estimation of fracture risk by FRAX. The National Osteoporosis Foundation recommends that FDA-approved medical therapies be considered in postmenopausal women and men age 54 or older with a: 1. Hip or vertebral (clinical or morphometric) fracture. 2. T-score of -2.5 or lower at the spine or hip. 3. Ten-year fracture probability by FRAX of 3% or greater for hip fracture or 20% or greater for major osteoporotic fracture. People with diagnosed cases of osteoporosis or at high risk for fracture should have regular bone mineral density tests. For patients eligible for Medicare, routine testing is allowed once every 2 years. The testing frequency can be increased to one year for patients who have rapidly progressing disease, those who are receiving or discontinuing medical therapy to restore bone mass, or have additional risk factors. [employer] [facility]) Criteria: Normal: T-scores from +1.0 to -1.0 Low Bone Mass (Osteopenia): T-scores between -1.0 and -2.5 Osteoporosis: T-scores -2.5 and below Comparison to Reference Population: T-score is the key measure used in the diagnosis of osteoporosis and relative risk determination for fracture. It provides a value for bone mass relative to the mean bone mass of a young adult  reference population expressed in terms of standard deviation (SD). Z-score is the age-matched score showing the patient's values compared to a population matched for age, sex, and race. This is also expressed in terms of standard deviation. The patient may have values that compare favorably to the age-matched values and still be at increased risk for fracture. Electronically Signed   By: Lovey Newcomer M.D.   On: 10/04/2014 18:28    Assessment & Plan:   There are no diagnoses linked to this encounter. I am having Ms. Wanner maintain her acetaminophen, multivitamin with minerals, diphenhydrAMINE, amLODipine, losartan, ergocalciferol, cholecalciferol,  hydrocortisone, CVS B-12, ALIGN, potassium chloride SA, and acyclovir.  No orders of the defined types were placed in this encounter.     Follow-up: No Follow-up on file.  Walker Kehr, MD

## 2015-10-29 ENCOUNTER — Other Ambulatory Visit: Payer: Self-pay | Admitting: *Deleted

## 2015-10-29 DIAGNOSIS — R29810 Facial weakness: Secondary | ICD-10-CM

## 2015-10-30 DIAGNOSIS — J029 Acute pharyngitis, unspecified: Secondary | ICD-10-CM | POA: Diagnosis not present

## 2015-11-01 ENCOUNTER — Telehealth: Payer: Self-pay | Admitting: Oncology

## 2015-11-01 NOTE — Telephone Encounter (Signed)
Returned call to patient re needing f/u with GM. Per patient she has a knot under her arm - has saw other doctors who says it is a cyst but she would like to have GM check it out. Rescheduled missed January 2017 lab/fu for 3/6 with [facility] - no availability with GM. Patient forwarded to desk nurse to leave message re know under arm.

## 2015-11-05 ENCOUNTER — Other Ambulatory Visit (HOSPITAL_BASED_OUTPATIENT_CLINIC_OR_DEPARTMENT_OTHER): Payer: Medicare Other

## 2015-11-05 ENCOUNTER — Ambulatory Visit (HOSPITAL_BASED_OUTPATIENT_CLINIC_OR_DEPARTMENT_OTHER): Payer: Medicare Other | Admitting: Nurse Practitioner

## 2015-11-05 ENCOUNTER — Encounter: Payer: Self-pay | Admitting: Nurse Practitioner

## 2015-11-05 VITALS — BP 144/60 | HR 78 | Temp 98.3°F | Resp 18 | Ht 66.0 in | Wt 209.7 lb

## 2015-11-05 DIAGNOSIS — C50912 Malignant neoplasm of unspecified site of left female breast: Secondary | ICD-10-CM

## 2015-11-05 DIAGNOSIS — C50911 Malignant neoplasm of unspecified site of right female breast: Secondary | ICD-10-CM

## 2015-11-05 LAB — COMPREHENSIVE METABOLIC PANEL
ALK PHOS: 68 U/L (ref 40–150)
ALT: 14 U/L (ref 0–55)
ANION GAP: 9 meq/L (ref 3–11)
AST: 13 U/L (ref 5–34)
Albumin: 3.4 g/dL — ABNORMAL LOW (ref 3.5–5.0)
BUN: 13.8 mg/dL (ref 7.0–26.0)
CO2: 28 meq/L (ref 22–29)
Calcium: 9.8 mg/dL (ref 8.4–10.4)
Chloride: 104 mEq/L (ref 98–109)
Creatinine: 0.9 mg/dL (ref 0.6–1.1)
EGFR: 67 mL/min/{1.73_m2} — AB (ref >90)
GLUCOSE: 117 mg/dL (ref 70–140)
POTASSIUM: 3.8 meq/L (ref 3.5–5.1)
SODIUM: 141 meq/L (ref 136–145)
TOTAL PROTEIN: 7.2 g/dL (ref 6.4–8.3)

## 2015-11-05 LAB — CBC WITH DIFFERENTIAL/PLATELET
BASO%: 0.5 % (ref 0.0–2.0)
BASOS ABS: 0 10*3/uL (ref 0.0–0.1)
EOS%: 2.3 % (ref 0.0–7.0)
Eosinophils Absolute: 0.1 10*3/uL (ref 0.0–0.5)
HEMATOCRIT: 37.4 % (ref 34.8–46.6)
HGB: 12.3 g/dL (ref 11.6–15.9)
LYMPH#: 3.1 10*3/uL (ref 0.9–3.3)
LYMPH%: 51.1 % — ABNORMAL HIGH (ref 14.0–49.7)
MCH: 28 pg (ref 25.1–34.0)
MCHC: 32.9 g/dL (ref 31.5–36.0)
MCV: 85.2 fL (ref 79.5–101.0)
MONO#: 0.6 10*3/uL (ref 0.1–0.9)
MONO%: 10 % (ref 0.0–14.0)
NEUT#: 2.2 10*3/uL (ref 1.5–6.5)
NEUT%: 36.1 % — AB (ref 38.4–76.8)
Platelets: 289 10*3/uL (ref 145–400)
RBC: 4.39 10*6/uL (ref 3.70–5.45)
RDW: 15.3 % — AB (ref 11.2–14.5)
WBC: 6 10*3/uL (ref 3.9–10.3)

## 2015-11-05 NOTE — Progress Notes (Signed)
ID: [name] [name]   DOB: Jul 03, 1933  MR#: [id number]  IDH#:[id number]  PCP: Kirk Ruths MD GYN:  SU:  OTHER MD:  CHIEF COMPLAINT: hx bilateral breast cancers CURRENT TREATMENT: exemestane daily   INTERVAL HISTORY: Allanna returns today for follow up of her bilateral breast cancers. She continues on exemestane and tolerates this well with no issues. She denies hot flashes, vaginal dryness, or increased myalgias or arthralgias.   REVIEW OF SYSTEMS: Dajana had an painful inflamed lymph node to her right neck. She was prescribed amoxicillin and prednisone by an ENT, and finished up that course late last week. The swelling has gone down, but it is still minimally tender. She denies fevers, chills, or recent illness. She has no nausea, vomiting, or changes in bowel or bladder habits. Her appetite is good, but she has lost most of her taste sensation for the past 2 months. She can only taste extremely acidic or salty foods.  She has headaches, dizziness, unexplained weight loss, or night sweats. She denies shortness of breath, chest pain, cough, or palpitations. A detailed review of systems is otherwise negative.   PAST MEDICAL HISTORY: Past Medical History  Diagnosis Date  . Anemia     iron deficiency  . Diverticulosis of colon 2004    Dr Deatra Ina  . Hyperlipidemia   . Hypertension   . Vitamin D deficiency   . Vitamin B12 deficiency   . Hematuria     Dr Terance Hart  . Depression 2009    grief  . Osteoarthritis, knee   . Cancer (HCC)     breast, skin  . GERD (gastroesophageal reflux disease)   . Heart murmur   . Acute lymphadenitis of arm     PAST SURGICAL HISTORY: Past Surgical History  Procedure Laterality Date  . Total knee arthroplasty  06-04-98  . Mastectomy  09-13-08    left and right  . L groin skin cancer  2011  . Joint replacement    . Abdominal hysterectomy    . Total knee arthroplasty Right 05/24/2014    Procedure: RIGHT TOTAL KNEE ARTHROPLASTY;  Surgeon: Newt Minion, MD;  Location: [facility];  Service: Orthopedics;  Laterality: Right;    FAMILY HISTORY Family History  Problem Relation Age of Onset  . Cancer Sister     breast  . Coronary artery disease Other   . Cancer Other     aunt, niece  . Cancer Maternal Aunt     breast   the patient's father died at the age of 57 from complications of diabetes. The patient's mother died at the age of 42 from heart disease. The patient had 3 brothers and 4 sisters. One sister was diagnosed with breast cancer at the age of 71. There is no history of ovarian cancer in the family.  GYNECOLOGIC HISTORY: She is GX P5, with first live birth age 52. She had her hysterectomy in 1963. She never used hormone replacement.  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 She was widowed in 2009, and lives by herself. 4 of her 5 children live in town. She attends [employer] locally.   ADVANCED DIRECTIVES: Zephyr does not have advanced directives in place, but in case of an emergency she requests we call her daughter Andrey Farmer at [place]: Social History  Substance Use Topics  . Smoking status: Never Smoker   . Smokeless tobacco: Not on file  . Alcohol Use: No  Colonoscopy: 2002  PAP: s/p hyst.  Bone density: May 2012/ nl  Lipid panel:  Allergies  Allergen Reactions  . Anastrozole     REACTION: arthralgias  . Aspirin Other (See Comments)    Gi upset   . Ciprofloxacin Itching  . Letrozole     REACTION: leg pain    Current Outpatient Prescriptions  Medication Sig Dispense Refill  . acetaminophen (TYLENOL) 500 MG tablet Take 1,000 mg by mouth every 6 (six) hours as needed.     Marland Kitchen amLODipine (NORVASC) 10 MG tablet Take 1 tablet (10 mg total) by mouth daily. 90 tablet 3  . cholecalciferol (VITAMIN D) 1000 UNITS tablet Take 1 tablet (1,000 Units total) by mouth daily. 100 tablet 3  . [facility] B-12 500 MCG SUBL PLACE 2 TABLETS UNDER THE TONGUE DAILY  11  .  ergocalciferol (VITAMIN D2) 50000 UNITS capsule Take 1 capsule (50,000 Units total) by mouth once a week. 6 capsule 0  . exemestane (AROMASIN) 25 MG tablet Take 25 mg by mouth daily after breakfast.    . losartan (COZAAR) 100 MG tablet Take 1 tablet (100 mg total) by mouth daily. 90 tablet 3  . Multiple Vitamin (MULTIVITAMIN WITH MINERALS) TABS tablet Take 1 tablet by mouth daily.    . potassium chloride SA (K-DUR,KLOR-CON) 20 MEQ tablet Take 1 tablet (20 mEq total) by mouth 2 (two) times daily. 60 tablet 3  . Probiotic Product (ALIGN) 4 MG CAPS Take 1 capsule (4 mg total) by mouth daily. 30 capsule 0  . acyclovir (ZOVIRAX) 800 MG tablet Take 1 tablet (800 mg total) by mouth 4 (four) times daily. (Patient not taking: Reported on 11/05/2015) 35 tablet 1  . diphenhydrAMINE (SOMINEX) 25 MG tablet Take 25 mg by mouth at bedtime as needed for sleep. Reported on 11/05/2015    . hydrocortisone 2.5 % ointment Apply topically 2 (two) times daily. (Patient not taking: Reported on 11/05/2015) 30 g 3   No current facility-administered medications for this visit.    OBJECTIVE: Elderly African American woman who appears stated age 28 Vitals:   11/05/15 1447  BP: 144/60  Pulse: 78  Temp: 98.3 F (36.8 C)  Resp: 18     Body mass index is 33.86 kg/(m^2).    ECOG FS: 1  Skin: warm, dry  HEENT: sclerae anicteric, conjunctivae pink, oropharynx clear. No thrush or mucositis.  Lymph Nodes: No cervical or supraclavicular lymphadenopathy  Lungs: clear to auscultation bilaterally, no rales, wheezes, or rhonci  Heart: regular rate and rhythm, easily auscultated systolic murmur Abdomen: round, soft, non tender, positive bowel sounds  Musculoskeletal: No focal spinal tenderness, no peripheral edema  Neuro: non focal, well oriented, positive affect  Breasts: bilateral breasts status post mastectomies. No evidence of recurrent disease. Bilateral axillae benign.  LAB RESULTS: Lab Results  Component Value Date    WBC 6.0 11/05/2015   NEUTROABS 2.2 11/05/2015   HGB 12.3 11/05/2015   HCT 37.4 11/05/2015   MCV 85.2 11/05/2015   PLT 289 11/05/2015      Chemistry      Component Value Date/Time   NA 141 11/05/2015 1429   NA 139 10/04/2015 1134   K 3.8 11/05/2015 1429   K 3.3* 10/04/2015 1134   CL 98 10/04/2015 1134   CL 102 05/13/2012 1021   CO2 28 11/05/2015 1429   CO2 32 10/04/2015 1134   BUN 13.8 11/05/2015 1429   BUN 18 10/04/2015 1134   CREATININE 0.9 11/05/2015 1429   CREATININE  1.13 10/04/2015 1134      Component Value Date/Time   CALCIUM 9.8 11/05/2015 1429   CALCIUM 10.4 10/04/2015 1134   ALKPHOS 68 11/05/2015 1429   ALKPHOS 63 10/04/2015 1134   AST 13 11/05/2015 1429   AST 14 10/04/2015 1134   ALT 14 11/05/2015 1429   ALT 16 10/04/2015 1134   BILITOT <0.30 11/05/2015 1429   BILITOT 0.4 10/04/2015 1134       Lab Results  Component Value Date   LABCA2 29 05/13/2012    No components found for: [id number]  No results for input(s): INR in the last 168 hours.  Urinalysis    Component Value Date/Time   COLORURINE YELLOW 10/04/2015 [place] 10/04/2015 0947   LABSPEC 1.025 10/04/2015 0947   PHURINE 6.0 10/04/2015 0947   GLUCOSEU NEGATIVE 10/04/2015 0947   HGBUR MODERATE* 10/04/2015 0947   BILIRUBINUR NEGATIVE 10/04/2015 0947   BILIRUBINUR neg 09/24/2015 1055   KETONESUR TRACE* 10/04/2015 0947   PROTEINUR 1+ 09/24/2015 1055   UROBILINOGEN 0.2 10/04/2015 0947   UROBILINOGEN 1.0 09/24/2015 1055   NITRITE NEGATIVE 10/04/2015 0947   NITRITE neg 09/24/2015 1055   LEUKOCYTESUR NEGATIVE 10/04/2015 0947    STUDIES: No results found.  ASSESSMENT: 82 y.o. [place] woman with a history of:   1. Left breast cancer, status post modified radical mastectomy July 1997 for a 2-cm medullary tumor, treated neoadjuvantly with paclitaxel and doxorubicin, mastectomy revealing 5 out of 6 nodes involved, post mastectomy treatment including CMF (for this  triple-negative lesion), followed by radiation, completed January 1998.    2. Status post right mastectomy January 2010 for a T1c N0, stage IA invasive ductal carcinoma, grade 2, strongly estrogen and progesterone receptor positive, HER2 negative, with an MIB-1 of 22%.  She tolerated letrozole and anastrozole poorly and she had a remote history of left lower extremity deep venous thrombosis associated with trauma, so we did not try tamoxifen.  She was started on exemestane March 2012 and continues on that medicine with good tolerance.      PLAN:  Annibelle has completed the prescribed 5 years of antiestrogen therapy on exemestane and is now eligible to "graduate" from follow up visits here at the cancer center. We will keep her records for a number of years and she consider a patient of ours, but she is no longer required to return yearly for a physical. We are turning her over to her PCP, Dr. Alain Marion, who will be responsible for a chest wall exam every year. If there are any questions or concerns, we will happily consult the issue at hand. It has been a pleasure to serve this patient.   Genelle Gather Anmol Fleck    11/05/2015

## 2016-03-24 ENCOUNTER — Telehealth: Payer: Self-pay | Admitting: Internal Medicine

## 2016-03-24 MED ORDER — LOSARTAN POTASSIUM 100 MG PO TABS: 100.0000 mg | ORAL_TABLET | Freq: Every day | ORAL | 1 refills | Status: DC

## 2016-03-24 MED ORDER — AMLODIPINE BESYLATE 10 MG PO TABS: 10.0000 mg | ORAL_TABLET | Freq: Every day | ORAL | 1 refills | Status: DC

## 2016-03-24 NOTE — Telephone Encounter (Signed)
[facility]

## 2016-03-24 NOTE — Telephone Encounter (Signed)
Pt request refill for amlodipine and losartan. [facility]

## 2016-05-14 ENCOUNTER — Ambulatory Visit: Payer: Medicare Other | Admitting: Internal Medicine

## 2016-07-10 DIAGNOSIS — H40033 Anatomical narrow angle, bilateral: Secondary | ICD-10-CM | POA: Diagnosis not present

## 2016-07-10 DIAGNOSIS — H2513 Age-related nuclear cataract, bilateral: Secondary | ICD-10-CM | POA: Diagnosis not present

## 2016-07-17 ENCOUNTER — Encounter: Payer: Self-pay | Admitting: Internal Medicine

## 2016-07-17 ENCOUNTER — Ambulatory Visit (INDEPENDENT_AMBULATORY_CARE_PROVIDER_SITE_OTHER): Payer: Medicare Other | Admitting: Internal Medicine

## 2016-07-17 ENCOUNTER — Other Ambulatory Visit (INDEPENDENT_AMBULATORY_CARE_PROVIDER_SITE_OTHER): Payer: Medicare Other

## 2016-07-17 VITALS — BP 138/76 | HR 61 | Temp 98.6°F | Ht 66.0 in | Wt 214.0 lb

## 2016-07-17 DIAGNOSIS — E559 Vitamin D deficiency, unspecified: Secondary | ICD-10-CM

## 2016-07-17 DIAGNOSIS — I10 Essential (primary) hypertension: Secondary | ICD-10-CM | POA: Diagnosis not present

## 2016-07-17 DIAGNOSIS — G8929 Other chronic pain: Secondary | ICD-10-CM

## 2016-07-17 DIAGNOSIS — Z23 Encounter for immunization: Secondary | ICD-10-CM | POA: Diagnosis not present

## 2016-07-17 DIAGNOSIS — M25562 Pain in left knee: Secondary | ICD-10-CM | POA: Diagnosis not present

## 2016-07-17 DIAGNOSIS — E785 Hyperlipidemia, unspecified: Secondary | ICD-10-CM

## 2016-07-17 DIAGNOSIS — E538 Deficiency of other specified B group vitamins: Secondary | ICD-10-CM | POA: Diagnosis not present

## 2016-07-17 DIAGNOSIS — R6889 Other general symptoms and signs: Secondary | ICD-10-CM

## 2016-07-17 DIAGNOSIS — Z0000 Encounter for general adult medical examination without abnormal findings: Secondary | ICD-10-CM

## 2016-07-17 LAB — BASIC METABOLIC PANEL
BUN: 11 mg/dL (ref 6–23)
CALCIUM: 9.6 mg/dL (ref 8.4–10.5)
CO2: 32 meq/L (ref 19–32)
CREATININE: 0.98 mg/dL (ref 0.40–1.20)
Chloride: 102 mEq/L (ref 96–112)
GFR: 69.97 mL/min (ref >60.00)
GLUCOSE: 86 mg/dL (ref 70–99)
Potassium: 3.6 mEq/L (ref 3.5–5.1)
SODIUM: 141 meq/L (ref 135–145)

## 2016-07-17 LAB — CBC WITH DIFFERENTIAL/PLATELET
BASOS ABS: 0.1 10*3/uL (ref 0.0–0.1)
Basophils Relative: 1 % (ref 0.0–3.0)
EOS ABS: 0.1 10*3/uL (ref 0.0–0.7)
Eosinophils Relative: 2.4 % (ref 0.0–5.0)
HCT: 37.8 % (ref 36.0–46.0)
Hemoglobin: 12.5 g/dL (ref 12.0–15.0)
LYMPHS ABS: 3 10*3/uL (ref 0.7–4.0)
Lymphocytes Relative: 51.2 % — ABNORMAL HIGH (ref 12.0–46.0)
MCHC: 33.1 g/dL (ref 30.0–36.0)
MCV: 85 fl (ref 78.0–100.0)
MONO ABS: 0.6 10*3/uL (ref 0.1–1.0)
MONOS PCT: 10.6 % (ref 3.0–12.0)
NEUTROS ABS: 2 10*3/uL (ref 1.4–7.7)
NEUTROS PCT: 34.8 % — AB (ref 43.0–77.0)
PLATELETS: 288 10*3/uL (ref 150.0–400.0)
RBC: 4.45 Mil/uL (ref 3.87–5.11)
RDW: 14.3 % (ref 11.5–15.5)
WBC: 5.8 10*3/uL (ref 4.0–10.5)

## 2016-07-17 LAB — URINALYSIS, ROUTINE W REFLEX MICROSCOPIC
Bilirubin Urine: NEGATIVE
Ketones, ur: NEGATIVE
Leukocytes, UA: NEGATIVE
Nitrite: NEGATIVE
PH: 6.5 (ref 5.0–8.0)
SPECIFIC GRAVITY, URINE: 1.01 (ref 1.000–1.030)
Total Protein, Urine: NEGATIVE
UROBILINOGEN UA: 0.2 (ref 0.0–1.0)
Urine Glucose: NEGATIVE

## 2016-07-17 LAB — LIPID PANEL
CHOL/HDL RATIO: 3
Cholesterol: 282 mg/dL — ABNORMAL HIGH (ref 0–200)
HDL: 83.4 mg/dL (ref >39.00)
LDL Cholesterol: 178 mg/dL — ABNORMAL HIGH (ref 0–99)
NONHDL: 198.31
Triglycerides: 102 mg/dL (ref 0.0–149.0)
VLDL: 20.4 mg/dL (ref 0.0–40.0)

## 2016-07-17 LAB — TSH: TSH: 1.53 u[IU]/mL (ref 0.35–4.50)

## 2016-07-17 LAB — HEPATIC FUNCTION PANEL
ALBUMIN: 4.1 g/dL (ref 3.5–5.2)
ALK PHOS: 62 U/L (ref 39–117)
ALT: 9 U/L (ref 0–35)
AST: 15 U/L (ref 0–37)
BILIRUBIN TOTAL: 0.4 mg/dL (ref 0.2–1.2)
Bilirubin, Direct: 0.1 mg/dL (ref 0.0–0.3)
Total Protein: 7.5 g/dL (ref 6.0–8.3)

## 2016-07-17 NOTE — Assessment & Plan Note (Signed)
On Vit D 

## 2016-07-17 NOTE — Addendum Note (Signed)
Addended by: Cresenciano Lick on: 07/17/2016 11:31 AM   Modules accepted: Orders

## 2016-07-17 NOTE — Assessment & Plan Note (Signed)
Here for medicare wellness/physical  Diet: heart healthy  Physical activity: sedentary  Depression/mood screen: negative  Hearing: intact to whispered voice  Visual acuity: grossly normal, performs annual eye exam  ADLs: capable  Fall risk: low (cane) Home safety: good  Cognitive evaluation: intact to orientation, naming, recall and repetition  EOL planning: adv directives, full code/ I agree  I have personally reviewed and have noted  1. The patient's medical, surgical and social history  2. Their use of alcohol, tobacco or illicit drugs  3. Their current medications and supplements  4. The patient's functional ability including ADL's, fall risks, home safety risks and hearing or visual impairment.  5. Diet and physical activities  6. Evidence for depression or mood disorders 7. The roster of all physicians providing medical care to patient - is listed in the Snapshot section of the chart and reviewed today.    Today patient counseled on age appropriate routine health concerns for screening and prevention, each reviewed and up to date or declined. Immunizations reviewed and up to date or declined. Labs ordered and reviewed. Risk factors for depression reviewed and negative. Hearing function and visual acuity are intact. ADLs screened and addressed as needed. Functional ability and level of safety reviewed and appropriate. Education, counseling and referrals performed based on assessed risks today. Patient provided with a copy of personalized plan for preventive services.

## 2016-07-17 NOTE — Assessment & Plan Note (Signed)
Losartan, Amlodipine

## 2016-07-17 NOTE — Assessment & Plan Note (Signed)
Repeat labs:

## 2016-07-17 NOTE — Progress Notes (Signed)
Subjective:  Patient ID: [name] [name], female    DOB: 1933/01/30  Age: 82 y.o. MRN: [id number]  CC: No chief complaint on file.   HPI ABI BAAS presents for a well exam C/o L knee pain at night - worse C/o feeling cold all the time  Outpatient Medications Prior to Visit  Medication Sig Dispense Refill  . acetaminophen (TYLENOL) 500 MG tablet Take 1,000 mg by mouth every 6 (six) hours as needed.     Marland Kitchen acyclovir (ZOVIRAX) 800 MG tablet Take 1 tablet (800 mg total) by mouth 4 (four) times daily. 35 tablet 1  . amLODipine (NORVASC) 10 MG tablet Take 1 tablet (10 mg total) by mouth daily. 90 tablet 1  . [facility] B-12 500 MCG SUBL PLACE 2 TABLETS UNDER THE TONGUE DAILY  11  . diphenhydrAMINE (SOMINEX) 25 MG tablet Take 25 mg by mouth at bedtime as needed for sleep. Reported on 11/05/2015    . exemestane (AROMASIN) 25 MG tablet Take 25 mg by mouth daily after breakfast.    . hydrocortisone 2.5 % ointment Apply topically 2 (two) times daily. 30 g 3  . losartan (COZAAR) 100 MG tablet Take 1 tablet (100 mg total) by mouth daily. 90 tablet 1  . Multiple Vitamin (MULTIVITAMIN WITH MINERALS) TABS tablet Take 1 tablet by mouth daily.    . potassium chloride SA (K-DUR,KLOR-CON) 20 MEQ tablet Take 1 tablet (20 mEq total) by mouth 2 (two) times daily. 60 tablet 3  . Probiotic Product (ALIGN) 4 MG CAPS Take 1 capsule (4 mg total) by mouth daily. 30 capsule 0  . ergocalciferol (VITAMIN D2) 50000 UNITS capsule Take 1 capsule (50,000 Units total) by mouth once a week. 6 capsule 0   No facility-administered medications prior to visit.     ROS Review of Systems  Constitutional: Negative for activity change, appetite change, chills, fatigue and unexpected weight change.  HENT: Negative for congestion, mouth sores and sinus pressure.   Eyes: Negative for visual disturbance.  Respiratory: Negative for cough and chest tightness.   Gastrointestinal: Negative for abdominal pain and nausea.  Genitourinary:  Negative for difficulty urinating, frequency and vaginal pain.  Musculoskeletal: Positive for arthralgias and gait problem. Negative for back pain.  Skin: Negative for pallor and rash.  Neurological: Negative for dizziness, tremors, weakness, numbness and headaches.  Psychiatric/Behavioral: Negative for confusion and sleep disturbance.    Objective:  BP 138/76   Pulse 61   Temp 98.6 F (37 C) (Oral)   Ht 5\' 6"  (1.676 m)   Wt 214 lb (97.1 kg)   SpO2 96%   BMI 34.54 kg/m   BP Readings from Last 3 Encounters:  07/17/16 138/76  11/05/15 (!) 144/60  10/22/15 136/80    Wt Readings from Last 3 Encounters:  07/17/16 214 lb (97.1 kg)  11/05/15 209 lb 11.2 oz (95.1 kg)  10/22/15 210 lb (95.3 kg)    Physical Exam  Constitutional: She appears well-developed. No distress.  HENT:  Head: Normocephalic.  Right Ear: External ear normal.  Left Ear: External ear normal.  Nose: Nose normal.  Mouth/Throat: Oropharynx is clear and moist.  Eyes: Conjunctivae are normal. Pupils are equal, round, and reactive to light. Right eye exhibits no discharge. Left eye exhibits no discharge.  Neck: Normal range of motion. Neck supple. No JVD present. No tracheal deviation present. No thyromegaly present.  Cardiovascular: Normal rate, regular rhythm and normal heart sounds.   Pulmonary/Chest: No stridor. No respiratory distress. She has  no wheezes.  Abdominal: Soft. Bowel sounds are normal. She exhibits no distension and no mass. There is no tenderness. There is no rebound and no guarding.  Musculoskeletal: She exhibits tenderness. She exhibits no edema.  Lymphadenopathy:    She has no cervical adenopathy.  Neurological: She displays normal reflexes. No cranial nerve deficit. She exhibits normal muscle tone. Coordination abnormal.  Skin: No rash noted. No erythema.  Psychiatric: She has a normal mood and affect. Her behavior is normal. Judgment and thought content normal.  Cane L knee b anserina is  tender; B knees w/post-TKR scars  Lab Results  Component Value Date   WBC 6.0 11/05/2015   HGB 12.3 11/05/2015   HCT 37.4 11/05/2015   PLT 289 11/05/2015   GLUCOSE 117 11/05/2015   ALT 14 11/05/2015   AST 13 11/05/2015   NA 141 11/05/2015   K 3.8 11/05/2015   CL 98 10/04/2015   CREATININE 0.9 11/05/2015   BUN 13.8 11/05/2015   CO2 28 11/05/2015   TSH 1.16 04/23/2015   INR 1.67 (H) 05/27/2014   HGBA1C 5.6 12/29/2014    Dg Bone Density  Result Date: 10/04/2014 CLINICAL DATA:  81 year old postmenopausal female. EXAM: DUAL X-RAY ABSORPTIOMETRY (DXA) FOR BONE MINERAL DENSITY FINDINGS: LEFT FEMUR NECK Bone Mineral Density (BMD):  0.941 g/cm2 Young Adult T-Score: 0.8 Z-Score:  1.7 LEFT FOREARM (1/3 RADIUS) Bone Mineral Density (BMD):  0.775 Young Adult T Score:  1.3 Z Score:  4.4 ASSESSMENT: Patient's diagnostic category is normal by WHO Criteria. FRACTURE RISK: Not increased COMPARISON: When compared to baseline evaluation 08/20/2007 there has been a statistically significant interval decrease in bone mineral density of the left hip of 3.4%. When compared to prior evaluation 01/06/2011 there has been a statistically significant interval increase in bone mineral density of the left forearm of 4.2% and a statistically significant interval decrease in bone mineral density of the left hip of 3.4%. Effective therapies are available in the form of bisphosphonates, selective estrogen receptor modulators, biologic agents, and hormone replacement therapy (for women). All patients should ensure an adequate intake of dietary calcium (1200 mg daily) and vitamin D (800 IU daily) unless contraindicated. All treatment decisions require clinical judgment and consideration of individual patient factors, including patient preferences, co-morbidities, previous drug use, risk factors not captured in the FRAX model (e.g., frailty, falls, vitamin D deficiency, increased bone turnover, interval significant decline in bone  density) and possible under- or over-estimation of fracture risk by FRAX. The National Osteoporosis Foundation recommends that FDA-approved medical therapies be considered in postmenopausal women and men age 42 or older with a: 1. Hip or vertebral (clinical or morphometric) fracture. 2. T-score of -2.5 or lower at the spine or hip. 3. Ten-year fracture probability by FRAX of 3% or greater for hip fracture or 20% or greater for major osteoporotic fracture. People with diagnosed cases of osteoporosis or at high risk for fracture should have regular bone mineral density tests. For patients eligible for Medicare, routine testing is allowed once every 2 years. The testing frequency can be increased to one year for patients who have rapidly progressing disease, those who are receiving or discontinuing medical therapy to restore bone mass, or have additional risk factors. [facility] [employer]) Criteria: Normal: T-scores from +1.0 to -1.0 Low Bone Mass (Osteopenia): T-scores between -1.0 and -2.5 Osteoporosis: T-scores -2.5 and below Comparison to Reference Population: T-score is the key measure used in the diagnosis of osteoporosis and relative risk determination for fracture. It provides  a value for bone mass relative to the mean bone mass of a young adult reference population expressed in terms of standard deviation (SD). Z-score is the age-matched score showing the patient's values compared to a population matched for age, sex, and race. This is also expressed in terms of standard deviation. The patient may have values that compare favorably to the age-matched values and still be at increased risk for fracture. Electronically Signed   By: Lovey Newcomer M.D.   On: 10/04/2014 18:28    Assessment & Plan:   There are no diagnoses linked to this encounter. I have discontinued Ms. Barnette's ergocalciferol. I am also having her maintain her acetaminophen, multivitamin with minerals, diphenhydrAMINE, hydrocortisone,  [facility] B-12, ALIGN, potassium chloride SA, acyclovir, exemestane, amLODipine, and losartan.  No orders of the defined types were placed in this encounter.    Follow-up: No Follow-up on file.  Walker Kehr, MD

## 2016-07-17 NOTE — Patient Instructions (Addendum)
Use pillow between the legs Ice Sports cream   Health Maintenance for Postmenopausal Women Introduction Menopause is a normal process in which your reproductive ability comes to an end. This process happens gradually over a span of months to years, usually between the ages of 58 and 39. Menopause is complete when you have missed 12 consecutive menstrual periods. It is important to talk with your health care provider about some of the most common conditions that affect postmenopausal women, such as heart disease, cancer, and bone loss (osteoporosis). Adopting a healthy lifestyle and getting preventive care can help to promote your health and wellness. Those actions can also lower your chances of developing some of these common conditions. What should I know about menopause? During menopause, you may experience a number of symptoms, such as:  Moderate-to-severe hot flashes.  Night sweats.  Decrease in sex drive.  Mood swings.  Headaches.  Tiredness.  Irritability.  Memory problems.  Insomnia. Choosing to treat or not to treat menopausal changes is an individual decision that you make with your health care provider. What should I know about hormone replacement therapy and supplements? Hormone therapy products are effective for treating symptoms that are associated with menopause, such as hot flashes and night sweats. Hormone replacement carries certain risks, especially as you become older. If you are thinking about using estrogen or estrogen with progestin treatments, discuss the benefits and risks with your health care provider. What should I know about heart disease and stroke? Heart disease, heart attack, and stroke become more likely as you age. This may be due, in part, to the hormonal changes that your body experiences during menopause. These can affect how your body processes dietary fats, triglycerides, and cholesterol. Heart attack and stroke are both medical  emergencies. There are many things that you can do to help prevent heart disease and stroke:  Have your blood pressure checked at least every 1-2 years. High blood pressure causes heart disease and increases the risk of stroke.  If you are 16-3 years old, ask your health care provider if you should take aspirin to prevent a heart attack or a stroke.  Do not use any tobacco products, including cigarettes, chewing tobacco, or electronic cigarettes. If you need help quitting, ask your health care provider.  It is important to eat a healthy diet and maintain a healthy weight.  Be sure to include plenty of vegetables, fruits, low-fat dairy products, and lean protein.  Avoid eating foods that are high in solid fats, added sugars, or salt (sodium).  Get regular exercise. This is one of the most important things that you can do for your health.  Try to exercise for at least 150 minutes each week. The type of exercise that you do should increase your heart rate and make you sweat. This is known as moderate-intensity exercise.  Try to do strengthening exercises at least twice each week. Do these in addition to the moderate-intensity exercise.  Know your numbers.Ask your health care provider to check your cholesterol and your blood glucose. Continue to have your blood tested as directed by your health care provider. What should I know about cancer screening? There are several types of cancer. Take the following steps to reduce your risk and to catch any cancer development as early as possible. Breast Cancer  Practice breast self-awareness.  This means understanding how your breasts normally appear and feel.  It also means doing regular breast self-exams. Let your health care provider know about any changes,  no matter how small.  If you are 92 or older, have a clinician do a breast exam (clinical breast exam or CBE) every year. Depending on your age, family history, and medical history, it may  be recommended that you also have a yearly breast X-ray (mammogram).  If you have a family history of breast cancer, talk with your health care provider about genetic screening.  If you are at high risk for breast cancer, talk with your health care provider about having an MRI and a mammogram every year.  Breast cancer (BRCA) gene test is recommended for women who have family members with BRCA-related cancers. Results of the assessment will determine the need for genetic counseling and BRCA1 and for BRCA2 testing. BRCA-related cancers include these types:  Breast. This occurs in males or females.  Ovarian.  Tubal. This may also be called fallopian tube cancer.  Cancer of the abdominal or pelvic lining (peritoneal cancer).  Prostate.  Pancreatic. Cervical, Uterine, and Ovarian Cancer  Your health care provider may recommend that you be screened regularly for cancer of the pelvic organs. These include your ovaries, uterus, and vagina. This screening involves a pelvic exam, which includes checking for microscopic changes to the surface of your cervix (Pap test).  For women ages 21-65, health care providers may recommend a pelvic exam and a Pap test every three years. For women ages 70-65, they may recommend the Pap test and pelvic exam, combined with testing for human papilloma virus (HPV), every five years. Some types of HPV increase your risk of cervical cancer. Testing for HPV may also be done on women of any age who have unclear Pap test results.  Other health care providers may not recommend any screening for nonpregnant women who are considered low risk for pelvic cancer and have no symptoms. Ask your health care provider if a screening pelvic exam is right for you.  If you have had past treatment for cervical cancer or a condition that could lead to cancer, you need Pap tests and screening for cancer for at least 20 years after your treatment. If Pap tests have been discontinued for  you, your risk factors (such as having a new sexual partner) need to be reassessed to determine if you should start having screenings again. Some women have medical problems that increase the chance of getting cervical cancer. In these cases, your health care provider may recommend that you have screening and Pap tests more often.  If you have a family history of uterine cancer or ovarian cancer, talk with your health care provider about genetic screening.  If you have vaginal bleeding after reaching menopause, tell your health care provider.  There are currently no reliable tests available to screen for ovarian cancer. Lung Cancer  Lung cancer screening is recommended for adults 4-92 years old who are at high risk for lung cancer because of a history of smoking. A yearly low-dose CT scan of the lungs is recommended if you:  Currently smoke.  Have a history of at least 30 pack-years of smoking and you currently smoke or have quit within the past 15 years. A pack-year is smoking an average of one pack of cigarettes per day for one year. Yearly screening should:  Continue until it has been 15 years since you quit.  Stop if you develop a health problem that would prevent you from having lung cancer treatment. Colorectal Cancer  This type of cancer can be detected and can often be prevented.  Routine colorectal cancer screening usually begins at age 39 and continues through age 76.  If you have risk factors for colon cancer, your health care provider may recommend that you be screened at an earlier age.  If you have a family history of colorectal cancer, talk with your health care provider about genetic screening.  Your health care provider may also recommend using home test kits to check for hidden blood in your stool.  A small camera at the end of a tube can be used to examine your colon directly (sigmoidoscopy or colonoscopy). This is done to check for the earliest forms of colorectal  cancer.  Direct examination of the colon should be repeated every 5-10 years until age 20. However, if early forms of precancerous polyps or small growths are found or if you have a family history or genetic risk for colorectal cancer, you may need to be screened more often. Skin Cancer  Check your skin from head to toe regularly.  Monitor any moles. Be sure to tell your health care provider:  About any new moles or changes in moles, especially if there is a change in a mole's shape or color.  If you have a mole that is larger than the size of a pencil eraser.  If any of your family members has a history of skin cancer, especially at a young age, talk with your health care provider about genetic screening.  Always use sunscreen. Apply sunscreen liberally and repeatedly throughout the day.  Whenever you are outside, protect yourself by wearing long sleeves, pants, a wide-brimmed hat, and sunglasses. What should I know about osteoporosis? Osteoporosis is a condition in which bone destruction happens more quickly than new bone creation. After menopause, you may be at an increased risk for osteoporosis. To help prevent osteoporosis or the bone fractures that can happen because of osteoporosis, the following is recommended:  If you are 30-76 years old, get at least 1,000 mg of calcium and at least 600 mg of vitamin D per day.  If you are older than age 71 but younger than age 28, get at least 1,200 mg of calcium and at least 600 mg of vitamin D per day.  If you are older than age 72, get at least 1,200 mg of calcium and at least 800 mg of vitamin D per day. Smoking and excessive alcohol intake increase the risk of osteoporosis. Eat foods that are rich in calcium and vitamin D, and do weight-bearing exercises several times each week as directed by your health care provider. What should I know about how menopause affects my mental health? Depression may occur at any age, but it is more common as  you become older. Common symptoms of depression include:  Low or sad mood.  Changes in sleep patterns.  Changes in appetite or eating patterns.  Feeling an overall lack of motivation or enjoyment of activities that you previously enjoyed.  Frequent crying spells. Talk with your health care provider if you think that you are experiencing depression. What should I know about immunizations? It is important that you get and maintain your immunizations. These include:  Tetanus, diphtheria, and pertussis (Tdap) booster vaccine.  Influenza every year before the flu season begins.  Pneumonia vaccine.  Shingles vaccine. Your health care provider may also recommend other immunizations. This information is not intended to replace advice given to you by your health care provider. Make sure you discuss any questions you have with your health care provider. Document  Released: 10/10/2005 Document Revised: 03/07/2016 Document Reviewed: 05/22/2015  2017 Elsevier

## 2016-07-17 NOTE — Assessment & Plan Note (Addendum)
Worse. Pt declined injection L knee b anserina is tender; B knees w/post-TKR scars Use pillow between the legs Ice Sports cream

## 2016-07-17 NOTE — Progress Notes (Signed)
Pre visit review using our clinic review tool, if applicable. No additional management support is needed unless otherwise documented below in the visit note. 

## 2016-07-22 ENCOUNTER — Other Ambulatory Visit: Payer: Self-pay | Admitting: Internal Medicine

## 2016-07-25 ENCOUNTER — Telehealth: Payer: Self-pay | Admitting: Internal Medicine

## 2016-07-25 NOTE — Telephone Encounter (Signed)
Pt called about wanting to know if she will be prescribed medication due to her cholesterol being high? Please advise?  Call pt @ [phone]. Thank you!

## 2016-07-28 MED ORDER — PRAVASTATIN SODIUM 20 MG PO TABS
20.0000 mg | ORAL_TABLET | Freq: Every day | ORAL | 11 refills | Status: DC
Start: 2016-07-28 — End: 2016-07-30

## 2016-07-28 NOTE — Telephone Encounter (Signed)
OK Pravachol Rx emailed RTC 3 mo Thx

## 2016-07-30 ENCOUNTER — Telehealth: Payer: Self-pay | Admitting: Internal Medicine

## 2016-07-30 MED ORDER — PRAVASTATIN SODIUM 20 MG PO TABS: 20.0000 mg | ORAL_TABLET | Freq: Every day | ORAL | 11 refills | Status: DC

## 2016-07-30 NOTE — Telephone Encounter (Signed)
Patient states that pravastatin has not make it to her pharmacy.  Please resend.

## 2016-07-30 NOTE — Telephone Encounter (Signed)
Patient called to follow up. She states that the pharmacy told her they havent gotten the medication. Please call this in today.

## 2016-07-30 NOTE — Telephone Encounter (Signed)
Rx was phoned in to [facility] [address] Pt informed- she states she has filled/picked up med since this message was taken.

## 2016-07-30 NOTE — Telephone Encounter (Signed)
Rx sent. See meds. Left detailed mess informing pt.  

## 2016-08-04 ENCOUNTER — Other Ambulatory Visit: Payer: Medicare Other

## 2016-08-04 DIAGNOSIS — Z23 Encounter for immunization: Secondary | ICD-10-CM

## 2016-08-04 DIAGNOSIS — E559 Vitamin D deficiency, unspecified: Secondary | ICD-10-CM

## 2016-08-04 DIAGNOSIS — Z136 Encounter for screening for cardiovascular disorders: Secondary | ICD-10-CM

## 2016-08-04 DIAGNOSIS — D509 Iron deficiency anemia, unspecified: Secondary | ICD-10-CM

## 2016-08-04 DIAGNOSIS — I10 Essential (primary) hypertension: Secondary | ICD-10-CM

## 2016-08-04 DIAGNOSIS — E539 Vitamin B deficiency, unspecified: Secondary | ICD-10-CM | POA: Diagnosis not present

## 2016-08-04 DIAGNOSIS — Z0000 Encounter for general adult medical examination without abnormal findings: Secondary | ICD-10-CM

## 2016-08-05 LAB — VITAMIN D 25 HYDROXY (VIT D DEFICIENCY, FRACTURES): VIT D 25 HYDROXY: 34 ng/mL (ref 30–100)

## 2016-09-15 ENCOUNTER — Other Ambulatory Visit: Payer: Self-pay | Admitting: Internal Medicine

## 2016-09-16 ENCOUNTER — Ambulatory Visit (INDEPENDENT_AMBULATORY_CARE_PROVIDER_SITE_OTHER): Payer: Medicare Other | Admitting: Nurse Practitioner

## 2016-09-16 ENCOUNTER — Encounter: Payer: Self-pay | Admitting: Nurse Practitioner

## 2016-09-16 VITALS — BP 124/74 | HR 67 | Temp 98.5°F | Ht 66.0 in | Wt 212.0 lb

## 2016-09-16 DIAGNOSIS — J069 Acute upper respiratory infection, unspecified: Secondary | ICD-10-CM | POA: Diagnosis not present

## 2016-09-16 MED ORDER — IPRATROPIUM BROMIDE 0.06 % NA SOLN: 2.0000 | Freq: Three times a day (TID) | NASAL | 12 refills | Status: DC

## 2016-09-16 MED ORDER — DM-GUAIFENESIN ER 30-600 MG PO TB12: 1.0000 | ORAL_TABLET | Freq: Two times a day (BID) | ORAL | 0 refills | Status: DC | PRN

## 2016-09-16 MED ORDER — PROMETHAZINE-DM 6.25-15 MG/5ML PO SYRP: 5.0000 mL | ORAL_SOLUTION | Freq: Three times a day (TID) | ORAL | 0 refills | Status: DC | PRN

## 2016-09-16 NOTE — Progress Notes (Signed)
Pre visit review using our clinic review tool, if applicable. No additional management support is needed unless otherwise documented below in the visit note. 

## 2016-09-16 NOTE — Progress Notes (Signed)
Subjective:  Patient ID: [name] [name], female    DOB: 04-Apr-1933  Age: 83 y.o. MRN: [id number]  CC: Nasal Congestion (cold,congestion,cough for 5 days stomach feel funny when eat. took nyquil)   URI   This is a new problem. The current episode started in the past 7 days. The problem has been unchanged. Associated symptoms include congestion, coughing, headaches, nausea, rhinorrhea, sinus pain, sneezing and a sore throat. Pertinent negatives include no abdominal pain, chest pain, diarrhea, dysuria, ear pain, joint pain, joint swelling, neck pain, plugged ear sensation, rash, swollen glands, vomiting or wheezing. She has tried decongestant, increased fluids and acetaminophen for the symptoms. The treatment provided mild relief.    Outpatient Medications Prior to Visit  Medication Sig Dispense Refill  . acetaminophen (TYLENOL) 500 MG tablet Take 1,000 mg by mouth every 6 (six) hours as needed.     Marland Kitchen acyclovir (ZOVIRAX) 800 MG tablet Take 1 tablet (800 mg total) by mouth 4 (four) times daily. 35 tablet 1  . amLODipine (NORVASC) 10 MG tablet TAKE 1 TABLET BY MOUTH EVERY DAY 90 tablet 1  . [facility] B-12 500 MCG SUBL PLACE 2 TABLETS UNDER THE TONGUE DAILY  11  . diphenhydrAMINE (SOMINEX) 25 MG tablet Take 25 mg by mouth at bedtime as needed for sleep. Reported on 11/05/2015    . exemestane (AROMASIN) 25 MG tablet Take 25 mg by mouth daily after breakfast.    . hydrocortisone 2.5 % ointment Apply topically 2 (two) times daily. 30 g 3  . KLOR-CON M20 20 MEQ tablet TAKE 1 TABLET (20 MEQ TOTAL) BY MOUTH 2 (TWO) TIMES DAILY. 60 tablet 3  . losartan (COZAAR) 100 MG tablet TAKE 1 TABLET (100 MG TOTAL) BY MOUTH DAILY. 90 tablet 1  . Multiple Vitamin (MULTIVITAMIN WITH MINERALS) TABS tablet Take 1 tablet by mouth daily.    . pravastatin (PRAVACHOL) 20 MG tablet Take 1 tablet (20 mg total) by mouth daily. 30 tablet 11  . Probiotic Product (ALIGN) 4 MG CAPS Take 1 capsule (4 mg total) by mouth daily. 30 capsule 0    No facility-administered medications prior to visit.     ROS See HPI  Objective:  BP 124/74   Pulse 67   Temp 98.5 F (36.9 C)   Ht 5\' 6"  (1.676 m)   Wt 212 lb (96.2 kg)   SpO2 98%   BMI 34.22 kg/m   BP Readings from Last 3 Encounters:  09/16/16 124/74  07/17/16 138/76  11/05/15 (!) 144/60    Wt Readings from Last 3 Encounters:  09/16/16 212 lb (96.2 kg)  07/17/16 214 lb (97.1 kg)  11/05/15 209 lb 11.2 oz (95.1 kg)    Physical Exam  Constitutional: She is oriented to person, place, and time.  HENT:  Right Ear: Tympanic membrane, external ear and ear canal normal.  Left Ear: Tympanic membrane, external ear and ear canal normal.  Nose: Mucosal edema and rhinorrhea present. Right sinus exhibits maxillary sinus tenderness. Right sinus exhibits no frontal sinus tenderness. Left sinus exhibits maxillary sinus tenderness. Left sinus exhibits no frontal sinus tenderness.  Mouth/Throat: Uvula is midline. No trismus in the jaw. Posterior oropharyngeal erythema present. No oropharyngeal exudate.  Eyes: No scleral icterus.  Neck: Normal range of motion. Neck supple.  Cardiovascular: Normal rate and normal heart sounds.   Pulmonary/Chest: Effort normal and breath sounds normal.  Abdominal: Soft. Bowel sounds are normal. She exhibits no distension. There is no tenderness.  Musculoskeletal: She exhibits no edema.  Lymphadenopathy:    She has no cervical adenopathy.  Neurological: She is alert and oriented to person, place, and time.  Skin: Skin is warm and dry.  Vitals reviewed.   Lab Results  Component Value Date   WBC 5.8 07/17/2016   HGB 12.5 07/17/2016   HCT 37.8 07/17/2016   PLT 288.0 07/17/2016   GLUCOSE 86 07/17/2016   CHOL 282 (H) 07/17/2016   TRIG 102.0 07/17/2016   HDL 83.40 07/17/2016   LDLCALC 178 (H) 07/17/2016   ALT 9 07/17/2016   AST 15 07/17/2016   NA 141 07/17/2016   K 3.6 07/17/2016   CL 102 07/17/2016   CREATININE 0.98 07/17/2016   BUN 11  07/17/2016   CO2 32 07/17/2016   TSH 1.53 07/17/2016   INR 1.67 (H) 05/27/2014   HGBA1C 5.6 12/29/2014    Dg Bone Density  Result Date: 10/04/2014 CLINICAL DATA:  83 year old postmenopausal female. EXAM: DUAL X-RAY ABSORPTIOMETRY (DXA) FOR BONE MINERAL DENSITY FINDINGS: LEFT FEMUR NECK Bone Mineral Density (BMD):  0.941 g/cm2 Young Adult T-Score: 0.8 Z-Score:  1.7 LEFT FOREARM (1/3 RADIUS) Bone Mineral Density (BMD):  0.775 Young Adult T Score:  1.3 Z Score:  4.4 ASSESSMENT: Patient's diagnostic category is normal by WHO Criteria. FRACTURE RISK: Not increased COMPARISON: When compared to baseline evaluation 08/20/2007 there has been a statistically significant interval decrease in bone mineral density of the left hip of 3.4%. When compared to prior evaluation 01/06/2011 there has been a statistically significant interval increase in bone mineral density of the left forearm of 4.2% and a statistically significant interval decrease in bone mineral density of the left hip of 3.4%. Effective therapies are available in the form of bisphosphonates, selective estrogen receptor modulators, biologic agents, and hormone replacement therapy (for women). All patients should ensure an adequate intake of dietary calcium (1200 mg daily) and vitamin D (800 IU daily) unless contraindicated. All treatment decisions require clinical judgment and consideration of individual patient factors, including patient preferences, co-morbidities, previous drug use, risk factors not captured in the FRAX model (e.g., frailty, falls, vitamin D deficiency, increased bone turnover, interval significant decline in bone density) and possible under- or over-estimation of fracture risk by FRAX. The National Osteoporosis Foundation recommends that FDA-approved medical therapies be considered in postmenopausal women and men age 55 or older with a: 1. Hip or vertebral (clinical or morphometric) fracture. 2. T-score of -2.5 or lower at the spine or  hip. 3. Ten-year fracture probability by FRAX of 3% or greater for hip fracture or 20% or greater for major osteoporotic fracture. People with diagnosed cases of osteoporosis or at high risk for fracture should have regular bone mineral density tests. For patients eligible for Medicare, routine testing is allowed once every 2 years. The testing frequency can be increased to one year for patients who have rapidly progressing disease, those who are receiving or discontinuing medical therapy to restore bone mass, or have additional risk factors. [facility]) Criteria: Normal: T-scores from +1.0 to -1.0 Low Bone Mass (Osteopenia): T-scores between -1.0 and -2.5 Osteoporosis: T-scores -2.5 and below Comparison to Reference Population: T-score is the key measure used in the diagnosis of osteoporosis and relative risk determination for fracture. It provides a value for bone mass relative to the mean bone mass of a young adult reference population expressed in terms of standard deviation (SD). Z-score is the age-matched score showing the patient's values compared to a population matched for age, sex, and race. This is also  expressed in terms of standard deviation. The patient may have values that compare favorably to the age-matched values and still be at increased risk for fracture. Electronically Signed   By: Lovey Newcomer M.D.   On: 10/04/2014 18:28    Assessment & Plan:   Anilah was seen today for nasal congestion.  Diagnoses and all orders for this visit:  Acute URI -     ipratropium (ATROVENT) 0.06 % nasal spray; Place 2 sprays into both nostrils 3 (three) times daily. For 5days only -     dextromethorphan-guaiFENesin (MUCINEX DM) 30-600 MG 12hr tablet; Take 1 tablet by mouth 2 (two) times daily as needed for cough. -     promethazine-dextromethorphan (PROMETHAZINE-DM) 6.25-15 MG/5ML syrup; Take 5 mLs by mouth 3 (three) times daily as needed for cough.   I am having Ms. Balis start on  ipratropium, dextromethorphan-guaiFENesin, and promethazine-dextromethorphan. I am also having her maintain her acetaminophen, multivitamin with minerals, diphenhydrAMINE, hydrocortisone, [facility] B-12, ALIGN, acyclovir, exemestane, KLOR-CON M20, pravastatin, amLODipine, and losartan.  Meds ordered this encounter  Medications  . ipratropium (ATROVENT) 0.06 % nasal spray    Sig: Place 2 sprays into both nostrils 3 (three) times daily. For 5days only    Dispense:  15 mL    Refill:  12    Order Specific Question:   Supervising Provider    Answer:   Cassandria Anger [[id number]]  . dextromethorphan-guaiFENesin (MUCINEX DM) 30-600 MG 12hr tablet    Sig: Take 1 tablet by mouth 2 (two) times daily as needed for cough.    Dispense:  14 tablet    Refill:  0    Order Specific Question:   Supervising Provider    Answer:   Cassandria Anger [[id number]]  . promethazine-dextromethorphan (PROMETHAZINE-DM) 6.25-15 MG/5ML syrup    Sig: Take 5 mLs by mouth 3 (three) times daily as needed for cough.    Dispense:  240 mL    Refill:  0    Order Specific Question:   Supervising Provider    Answer:   Cassandria Anger [[id number]]    Follow-up: No Follow-up on file.  Wilfred Lacy, NP

## 2016-09-16 NOTE — Patient Instructions (Signed)
Encourage adequate oral hydration.  Call office if no improvement by Friday.  URI Instructions: Use over-the-counter  "cold" medicines  such as "Tylenol cold" , "Advil cold",  "Mucinex" or" Mucinex DM"  for cough and congestion.  Avoid decongestants if you have high blood pressure. Use" Delsym" or" Robitussin" cough syrup varietis for cough.  You can use plain "Tylenol" or "Advi"l for fever, chills and achyness.   "Common cold" symptoms are usually triggered by a virus.  The antibiotics are usually not necessary. On average, a" viral cold" illness would take 4-7 days to resolve. Please, make an appointment if you are not better or if you're worse.  Bland Diet Introduction A bland diet consists of foods that do not have a lot of fat or fiber. Foods without fat or fiber are easier for the body to digest. They are also less likely to irritate your mouth, throat, stomach, and other parts of your gastrointestinal tract. A bland diet is sometimes called a BRAT diet. What is my plan? Your health care provider or dietitian may recommend specific changes to your diet to prevent and treat your symptoms, such as:  Eating small meals often.  Cooking food until it is soft enough to chew easily.  Chewing your food well.  Drinking fluids slowly.  Not eating foods that are very spicy, sour, or fatty.  Not eating citrus fruits, such as oranges and grapefruit. What do I need to know about this diet?  Eat a variety of foods from the bland diet food list.  Do not follow a bland diet longer than you have to.  Ask your health care provider whether you should take vitamins. What foods can I eat? Grains  Hot cereals, such as cream of wheat. Bread, crackers, or tortillas made from refined white flour. Rice. Vegetables  Canned or cooked vegetables. Mashed or boiled potatoes. Fruits  Bananas. Applesauce. Other types of cooked or canned fruit with the skin and seeds removed, such as canned peaches or  pears. Meats and Other Protein Sources  Scrambled eggs. Creamy peanut butter or other nut butters. Lean, well-cooked meats, such as chicken or fish. Tofu. Soups or broths. Dairy  Low-fat dairy products, such as milk, cottage cheese, or yogurt. Beverages  Water. Herbal tea. Apple juice. Sweets and Desserts  Pudding. Custard. Fruit gelatin. Ice cream. Fats and Oils  Mild salad dressings. Canola or olive oil. The items listed above may not be a complete list of allowed foods or beverages. Contact your dietitian for more options.  What foods are not recommended? Foods and ingredients that are often not recommended include:  Spicy foods, such as hot sauce or salsa.  Fried foods.  Sour foods, such as pickled or fermented foods.  Raw vegetables or fruits, especially citrus or berries.  Caffeinated drinks.  Alcohol.  Strongly flavored seasonings or condiments. The items listed above may not be a complete list of foods and beverages that are not allowed. Contact your dietitian for more information.  This information is not intended to replace advice given to you by your health care provider. Make sure you discuss any questions you have with your health care provider. Document Released: 12/10/2015 Document Revised: 01/24/2016 Document Reviewed: 08/30/2014  2017 [employer]vier

## 2016-11-03 ENCOUNTER — Encounter: Payer: Self-pay | Admitting: Family

## 2016-11-03 ENCOUNTER — Ambulatory Visit (INDEPENDENT_AMBULATORY_CARE_PROVIDER_SITE_OTHER): Payer: Medicare Other | Admitting: Family

## 2016-11-03 DIAGNOSIS — M79604 Pain in right leg: Secondary | ICD-10-CM

## 2016-11-03 MED ORDER — IBUPROFEN 200 MG PO TABS: 200.0000 mg | ORAL_TABLET | Freq: Three times a day (TID) | ORAL | 0 refills | Status: DC | PRN

## 2016-11-03 NOTE — Assessment & Plan Note (Signed)
Right leg pain consistent with muscle soreness most likely associated with unaccustomed activity. Treat conservatively with ice, compression and home exercise therapy. Ibuprofen and Tylenol regimen for 5 days. Follow up if symptoms worsen or do not improve.

## 2016-11-03 NOTE — Patient Instructions (Signed)
Thank you for choosing [facility].  SUMMARY AND INSTRUCTIONS:  Ice x 20 minutes every 2 hours and after activity and before bed to both top and bottom of leg.   Stretches and exercises throughout the day.  Ibuprofen 200-400 mg 3x per day with Tylenol 500 mg.   Follow up:  If your symptoms worsen or fail to improve, please contact our office for further instruction, or in case of emergency go directly to the emergency room at the closest medical facility.    Hip Exercises Ask your health care provider which exercises are safe for you. Do exercises exactly as told by your health care provider and adjust them as directed. It is normal to feel mild stretching, pulling, tightness, or discomfort as you do these exercises, but you should stop right away if you feel sudden pain or your pain gets worse.Do not begin these exercises until told by your health care provider. STRETCHING AND RANGE OF MOTION EXERCISES  These exercises warm up your muscles and joints and improve the movement and flexibility of your hip. These exercises also help to relieve pain, numbness, and tingling. Exercise A: Hamstrings, Supine   1. Lie on your back. 2. Loop a belt or towel over the ball of your left / rightfoot. The ball of your foot is on the walking surface, right under your toes. 3. Straighten your left / rightknee and slowly pull on the belt to raise your leg.  Do not let your left / right knee bend while you do this.  Keep your other leg flat on the floor.  Raise the left / right leg until you feel a gentle stretch behind your left / right knee or thigh. 4. Hold this position for __________ seconds. 5. Slowly return your leg to the starting position. Repeat __________ times. Complete this stretch __________ times a day. Exercise B: Hip Rotators   1. Lie on your back on a firm surface. 2. Hold your left / right knee with your left / right hand. Hold your ankle with your other hand. 3. Gently  pull your left / right knee and rotate your lower leg toward your other shoulder.  Pull until you feel a stretch in your buttocks.  Keep your hips and shoulders firmly planted while you do this stretch. 4. Hold this position for __________ seconds. Repeat __________ times. Complete this stretch __________ times a day. Exercise C: V-Sit (Hamstrings and Adductors)   1. Sit on the floor with your legs extended in a large "V" shape. Keep your knees straight during this exercise. 2. Start with your head and chest upright, then bend at your waist to reach for your left foot (position A). You should feel a stretch in your right inner thigh. 3. Hold this position for __________ seconds. Then slowly return to the upright position. 4. Bend at your waist to reach forward (position B). You should feel a stretch behind both of your thighs and knees. 5. Hold this position for __________ seconds. Then slowly return to the upright position. 6. Bend at your waist to reach for your right foot (position C). You should feel a stretch in your left inner thigh. 7. Hold this position for __________ seconds. Then slowly return to the upright position. Repeat __________ times. Complete this stretch __________ times a day. Exercise D: Lunge (Hip Flexors)   1. Place your left / right knee on the floor and bend your other knee so that is directly over your ankle. You should  be half-kneeling. 2. Keep good posture with your head over your shoulders. 3. Tighten your buttocks to point your tailbone downward. This helps your back to keep from arching too much. 4. You should feel a gentle stretch in the front of your left / right thigh and hip. If you do not feel any resistance, slightly slide your other foot forward and then slowly lunge forward so your knee once again lines up over your ankle. 5. Make sure your tailbone continues to point downward. 6. Hold this position for __________ seconds. Repeat __________ times.  Complete this stretch __________ times a day. STRENGTHENING EXERCISES  These exercises build strength and endurance in your hip. Endurance is the ability to use your muscles for a long time, even after they get tired. Exercise E: Bridge (Hip Extensors)   1. Lie on your back on a firm surface with your knees bent and your feet flat on the floor. 2. Tighten your buttocks muscles and lift your bottom off the floor until the trunk of your body is level with your thighs.  Do not arch your back.  You should feel the muscles working in your buttocks and the back of your thighs. If you do not feel these muscles, slide your feet 1-2 inches (2.5-5 cm) farther away from your buttocks. 3. Hold this position for __________ seconds. 4. Slowly lower your hips to the starting position. 5. Let your muscles relax completely between repetitions. 6. If this exercise is too easy, try doing it with your arms crossed over your chest. Repeat __________ times. Complete this exercise __________ times a day. Exercise F: Straight Leg Raises - Hip Abductors   1. Lie on your side with your left / right leg in the top position. Lie so your head, shoulder, knee, and hip line up with each other. You may bend your bottom knee to help you balance. 2. Roll your hips slightly forward, so your hips are stacked directly over each other and your left / right knee is facing forward. 3. Leading with your heel, lift your top leg 4-6 inches (10-15 cm). You should feel the muscles in your outer hip lifting.  Do not let your foot drift forward.  Do not let your knee roll toward the ceiling. 4. Hold this position for __________ seconds. 5. Slowly return to the starting position. 6. Let your muscles relax completely between repetitions. Repeat __________ times. Complete this exercise __________ times a day. Exercise G: Straight Leg Raises - Hip Adductors   1. Lie on your side with your left / right leg in the bottom position. Lie so  your head, shoulder, knee, and hip line up. You may place your upper foot in front to help you balance. 2. Roll your hips slightly forward, so your hips are stacked directly over each other and your left / right knee is facing forward. 3. Tense the muscles in your inner thigh and lift your bottom leg 4-6 inches (10-15 cm). 4. Hold this position for __________ seconds. 5. Slowly return to the starting position. 6. Let your muscles relax completely between repetitions. Repeat __________ times. Complete this exercise __________ times a day. Exercise H: Straight Leg Raises - Quadriceps   1. Lie on your back with your left / right leg extended and your other knee bent. 2. Tense the muscles in the front of your left / right thigh. When you do this, you should see your kneecap slide up or see increased dimpling just above your knee. 3.  Tighten these muscles even more and raise your leg 4-6 inches (10-15 cm) off the floor. 4. Hold this position for __________ seconds. 5. Keep these muscles tense as you lower your leg. 6. Relax the muscles slowly and completely between repetitions. Repeat __________ times. Complete this exercise __________ times a day. Exercise I: Hip Abductors, Standing  1. Tie one end of a rubber exercise band or tubing to a secure surface, such as a table or pole. 2. Loop the other end of the band or tubing around your left / right ankle. 3. Keeping your ankle with the band or tubing directly opposite of the secured end, step away until there is tension in the tubing or band. Hold onto a chair as needed for balance. 4. Lift your left / right leg out to your side. While you do this:  Keep your back upright.  Keep your shoulders over your hips.  Keep your toes pointing forward.  Make sure to use your hip muscles to lift your leg. Do not "throw" your leg or tip your body to lift your leg. 5. Hold this position for __________ seconds. 6. Slowly return to the starting  position. Repeat __________ times. Complete this exercise __________ times a day. Exercise J: Squats (Quadriceps)  1. Stand in a door frame so your feet and knees are in line with the frame. You may place your hands on the frame for balance. 2. Slowly bend your knees and lower your hips like you are going to sit in a chair.  Keep your lower legs in a straight-up-and-down position.  Do not let your hips go lower than your knees.  Do not bend your knees lower than told by your health care provider.  If your hip pain increases, do not bend as low. 3. Hold this position for ___________ seconds. 4. Slowly push with your legs to return to standing. Do not use your hands to pull yourself to standing. Repeat __________ times. Complete this exercise __________ times a day. This information is not intended to replace advice given to you by your health care provider. Make sure you discuss any questions you have with your health care provider. Document Released: 09/05/2005 Document Revised: 05/12/2016 Document Reviewed: 08/13/2015 Elsevier Interactive Patient Education  2017 [employer].

## 2016-11-03 NOTE — Progress Notes (Signed)
Subjective:    Patient ID: [name] [name], female    DOB: 22-Aug-1933, 83 y.o.   MRN: [id number]  Chief Complaint  Patient presents with  . Leg Pain    right leg pain x1 week, pain starts at hip and states there is tingling in her foot    HPI:  [name] [name] is a 83 y.o. female who  has a past medical history of Acute lymphadenitis of arm; Anemia; Cancer ([place]); Depression (2009); Diverticulosis of colon (2004); GERD (gastroesophageal reflux disease); Heart murmur; Hematuria; Hyperlipidemia; Hypertension; Osteoarthritis, knee; Vitamin B12 deficiency; and Vitamin D deficiency. and presents today for an office visit.   This is a new problem. Associated symptom of pain located in her right leg that starts at her up and has been going on for about 1 week. Denies any specific trauma but notes it start after she was cleaning her house. No sounds or sensations heard or felt. Aggravated when she gets up and is improved when she is sitting still and note moving. Does have some tingling in her right foot. Denies back pain. Course of the symptoms has stayed about the same. Modifying factors include heat and arthritis medication. No previous hip or back trauma.    Allergies  Allergen Reactions  . Anastrozole     REACTION: arthralgias  . Aspirin Other (See Comments)    Gi upset   . Ciprofloxacin Itching  . Letrozole     REACTION: leg pain    Outpatient Medications Prior to Visit  Medication Sig Dispense Refill  . acetaminophen (TYLENOL) 500 MG tablet Take 1,000 mg by mouth every 6 (six) hours as needed.     Marland Kitchen acyclovir (ZOVIRAX) 800 MG tablet Take 1 tablet (800 mg total) by mouth 4 (four) times daily. 35 tablet 1  . amLODipine (NORVASC) 10 MG tablet TAKE 1 TABLET BY MOUTH EVERY DAY 90 tablet 1  . [facility] B-12 500 MCG SUBL PLACE 2 TABLETS UNDER THE TONGUE DAILY  11  . dextromethorphan-guaiFENesin (MUCINEX DM) 30-600 MG 12hr tablet Take 1 tablet by mouth 2 (two) times daily as needed for cough. 14  tablet 0  . diphenhydrAMINE (SOMINEX) 25 MG tablet Take 25 mg by mouth at bedtime as needed for sleep. Reported on 11/05/2015    . exemestane (AROMASIN) 25 MG tablet Take 25 mg by mouth daily after breakfast.    . hydrocortisone 2.5 % ointment Apply topically 2 (two) times daily. 30 g 3  . ipratropium (ATROVENT) 0.06 % nasal spray Place 2 sprays into both nostrils 3 (three) times daily. For 5days only 15 mL 12  . KLOR-CON M20 20 MEQ tablet TAKE 1 TABLET (20 MEQ TOTAL) BY MOUTH 2 (TWO) TIMES DAILY. 60 tablet 3  . losartan (COZAAR) 100 MG tablet TAKE 1 TABLET (100 MG TOTAL) BY MOUTH DAILY. 90 tablet 1  . Multiple Vitamin (MULTIVITAMIN WITH MINERALS) TABS tablet Take 1 tablet by mouth daily.    . pravastatin (PRAVACHOL) 20 MG tablet Take 1 tablet (20 mg total) by mouth daily. 30 tablet 11  . Probiotic Product (ALIGN) 4 MG CAPS Take 1 capsule (4 mg total) by mouth daily. 30 capsule 0  . promethazine-dextromethorphan (PROMETHAZINE-DM) 6.25-15 MG/5ML syrup Take 5 mLs by mouth 3 (three) times daily as needed for cough. 240 mL 0   No facility-administered medications prior to visit.       Review of Systems  Constitutional: Negative for chills and fever.  Musculoskeletal:  Positive for right leg pain.   Neurological: Negative for weakness and numbness.      Objective:    BP 118/76 (BP Location: Left Arm, Patient Position: Sitting, Cuff Size: Large)   Pulse 68   Temp 98.3 F (36.8 C) (Oral)   Resp 14   Ht 5\' 6"  (1.676 m)   Wt 216 lb (98 kg)   SpO2 93%   BMI 34.86 kg/m  Nursing note and vital signs reviewed.  Physical Exam  Constitutional: She is oriented to person, place, and time. She appears well-developed and well-nourished. No distress.  Cardiovascular: Normal rate, regular rhythm, normal heart sounds and intact distal pulses.   Pulmonary/Chest: Effort normal and breath sounds normal.  Musculoskeletal:  Right lower extremity - no obvious deformity, discoloration, or edema.  Mild palpable tenderness over biceps femoris. No crepitus or deformity. Range of motion within normal limits. Strength is 4+. Distal pulses and sensation are intact and appropriate.   Neurological: She is alert and oriented to person, place, and time.  Skin: Skin is warm and dry.  Psychiatric: She has a normal mood and affect. Her behavior is normal. Judgment and thought content normal.       Assessment & Plan:   Problem List Items Addressed This Visit      Other   Right leg pain    Right leg pain consistent with muscle soreness most likely associated with unaccustomed activity. Treat conservatively with ice, compression and home exercise therapy. Ibuprofen and Tylenol regimen for 5 days. Follow up if symptoms worsen or do not improve.           I am having [name] start on ibuprofen. I am also having her maintain her acetaminophen, multivitamin with minerals, diphenhydrAMINE, hydrocortisone, CVS B-12, ALIGN, acyclovir, exemestane, KLOR-CON M20, pravastatin, amLODipine, losartan, ipratropium, dextromethorphan-guaiFENesin, and promethazine-dextromethorphan.   Meds ordered this encounter  Medications  . ibuprofen (ADVIL,MOTRIN) 200 MG tablet    Sig: Take 1-2 tablets (200-400 mg total) by mouth 3 (three) times daily as needed.    Dispense:  60 tablet    Refill:  0    Order Specific Question:   Supervising Provider    Answer:   Pricilla Holm A [id number]     Follow-up: Return if symptoms worsen or fail to improve.  Mauricio Po, FNP

## 2016-12-19 ENCOUNTER — Ambulatory Visit (INDEPENDENT_AMBULATORY_CARE_PROVIDER_SITE_OTHER): Payer: Medicare Other | Admitting: Internal Medicine

## 2016-12-19 ENCOUNTER — Encounter: Payer: Self-pay | Admitting: Internal Medicine

## 2016-12-19 DIAGNOSIS — F3289 Other specified depressive episodes: Secondary | ICD-10-CM

## 2016-12-19 DIAGNOSIS — R600 Localized edema: Secondary | ICD-10-CM | POA: Diagnosis not present

## 2016-12-19 DIAGNOSIS — E538 Deficiency of other specified B group vitamins: Secondary | ICD-10-CM

## 2016-12-19 DIAGNOSIS — R609 Edema, unspecified: Secondary | ICD-10-CM | POA: Insufficient documentation

## 2016-12-19 MED ORDER — VALSARTAN-HYDROCHLOROTHIAZIDE 320-12.5 MG PO TABS: 1.0000 | ORAL_TABLET | Freq: Every day | ORAL | 3 refills | Status: DC

## 2016-12-19 MED ORDER — AMLODIPINE BESYLATE 10 MG PO TABS: 5.0000 mg | ORAL_TABLET | Freq: Every day | ORAL | 1 refills | Status: DC
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
## 2016-12-19 NOTE — Patient Instructions (Signed)
Stop Losartan. Take Valsartan HCT instead - 1 a day Reduce Amlodipine to 1/2 tablet a day   MC well w/Jill

## 2016-12-19 NOTE — Progress Notes (Signed)
Pre visit review using our clinic review tool, if applicable. No additional management support is needed unless otherwise documented below in the visit note. 

## 2016-12-19 NOTE — Assessment & Plan Note (Signed)
Poss Amlodipine related  Stop Losartan. Take Valsartan HCT instead - 1 a day Reduce Amlodipine to 1/2 tablet a day
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
## 2016-12-19 NOTE — Assessment & Plan Note (Signed)
On Vit B12 

## 2016-12-22 ENCOUNTER — Other Ambulatory Visit (INDEPENDENT_AMBULATORY_CARE_PROVIDER_SITE_OTHER): Payer: Medicare Other

## 2016-12-22 DIAGNOSIS — E538 Deficiency of other specified B group vitamins: Secondary | ICD-10-CM

## 2016-12-22 DIAGNOSIS — R600 Localized edema: Secondary | ICD-10-CM

## 2016-12-22 DIAGNOSIS — F3289 Other specified depressive episodes: Secondary | ICD-10-CM

## 2016-12-22 LAB — CBC WITH DIFFERENTIAL/PLATELET
BASOS ABS: 0 10*3/uL (ref 0.0–0.1)
Basophils Relative: 1.1 % (ref 0.0–3.0)
Eosinophils Absolute: 0.2 10*3/uL (ref 0.0–0.7)
Eosinophils Relative: 4.2 % (ref 0.0–5.0)
HEMATOCRIT: 38.3 % (ref 36.0–46.0)
HEMOGLOBIN: 12.8 g/dL (ref 12.0–15.0)
LYMPHS PCT: 55 % — AB (ref 12.0–46.0)
Lymphs Abs: 2.4 10*3/uL (ref 0.7–4.0)
MCHC: 33.4 g/dL (ref 30.0–36.0)
MCV: 85.8 fl (ref 78.0–100.0)
Monocytes Absolute: 0.4 10*3/uL (ref 0.1–1.0)
Monocytes Relative: 9.6 % (ref 3.0–12.0)
NEUTROS PCT: 30.1 % — AB (ref 43.0–77.0)
Neutro Abs: 1.3 10*3/uL — ABNORMAL LOW (ref 1.4–7.7)
Platelets: 274 10*3/uL (ref 150.0–400.0)
RBC: 4.46 Mil/uL (ref 3.87–5.11)
RDW: 14.5 % (ref 11.5–15.5)
WBC: 4.4 10*3/uL (ref 4.0–10.5)

## 2016-12-22 LAB — BASIC METABOLIC PANEL
BUN: 16 mg/dL (ref 6–23)
CHLORIDE: 102 meq/L (ref 96–112)
CO2: 30 meq/L (ref 19–32)
Calcium: 9.9 mg/dL (ref 8.4–10.5)
Creatinine, Ser: 0.94 mg/dL (ref 0.40–1.20)
GFR: 73.33 mL/min (ref >60.00)
GLUCOSE: 91 mg/dL (ref 70–99)
POTASSIUM: 3.9 meq/L (ref 3.5–5.1)
SODIUM: 140 meq/L (ref 135–145)

## 2016-12-22 LAB — TSH: TSH: 1.65 u[IU]/mL (ref 0.35–4.50)

## 2017-02-20 ENCOUNTER — Telehealth: Payer: Self-pay | Admitting: *Deleted

## 2017-02-20 NOTE — Telephone Encounter (Signed)
Pt left msg on triage requesting 90 day supply on the generic zanaflex...Johny Chess

## 2017-02-23 MED ORDER — PRAVASTATIN SODIUM 20 MG PO TABS: 20.0000 mg | ORAL_TABLET | Freq: Every day | ORAL | 1 refills | Status: DC

## 2017-02-23 NOTE — Telephone Encounter (Signed)
Called pt to verify medication bcz name of med that was left is not on her medication list. Pt is actually needing her cholesterol med " Pravastatin" she states she had been getting a 30 day supply, and wanting change to 90 day bcz it would be cheaper with insurance. Verified pharmacy inform pt will [facility].../lmb

## 2017-02-24 NOTE — Telephone Encounter (Signed)
Rx was sent 02/23/17 to [facility] received confirm msg that rx was recieved  pravastatin (PRAVACHOL) 20 MG tablet 90 tablet 1 02/23/2017 02/23/2018   Sig - Route: Take 1 tablet (20 mg total) by mouth daily. - Oral   E-Prescribing Status: Receipt confirmed by pharmacy (02/23/2017 10:50 AM EDT)

## 2017-02-24 NOTE — Telephone Encounter (Signed)
Patient states the pharmacy has yet to get the rx. Can you send this again please. Thank you.

## 2017-03-17 ENCOUNTER — Other Ambulatory Visit: Payer: Self-pay | Admitting: Internal Medicine

## 2017-04-28 DIAGNOSIS — S9031XA Contusion of right foot, initial encounter: Secondary | ICD-10-CM | POA: Diagnosis not present

## 2017-04-28 DIAGNOSIS — M79671 Pain in right foot: Secondary | ICD-10-CM | POA: Diagnosis not present

## 2017-05-12 DIAGNOSIS — L03031 Cellulitis of right toe: Secondary | ICD-10-CM | POA: Diagnosis not present

## 2017-05-29 DIAGNOSIS — L03031 Cellulitis of right toe: Secondary | ICD-10-CM | POA: Diagnosis not present

## 2017-06-05 DIAGNOSIS — M79671 Pain in right foot: Secondary | ICD-10-CM | POA: Diagnosis not present

## 2017-06-05 DIAGNOSIS — M2041 Other hammer toe(s) (acquired), right foot: Secondary | ICD-10-CM | POA: Diagnosis not present

## 2017-06-08 ENCOUNTER — Ambulatory Visit: Payer: Medicare Other | Admitting: Internal Medicine

## 2017-06-09 ENCOUNTER — Other Ambulatory Visit: Payer: Self-pay | Admitting: Internal Medicine

## 2017-07-01 ENCOUNTER — Ambulatory Visit (INDEPENDENT_AMBULATORY_CARE_PROVIDER_SITE_OTHER): Payer: Medicare Other | Admitting: Internal Medicine

## 2017-07-01 ENCOUNTER — Encounter: Payer: Self-pay | Admitting: Internal Medicine

## 2017-07-01 VITALS — BP 142/76 | HR 68 | Temp 97.8°F | Ht 66.0 in | Wt 218.0 lb

## 2017-07-01 DIAGNOSIS — B079 Viral wart, unspecified: Secondary | ICD-10-CM | POA: Insufficient documentation

## 2017-07-01 DIAGNOSIS — C50411 Malignant neoplasm of upper-outer quadrant of right female breast: Secondary | ICD-10-CM

## 2017-07-01 DIAGNOSIS — Z23 Encounter for immunization: Secondary | ICD-10-CM | POA: Diagnosis not present

## 2017-07-01 DIAGNOSIS — M1990 Unspecified osteoarthritis, unspecified site: Secondary | ICD-10-CM

## 2017-07-01 DIAGNOSIS — E538 Deficiency of other specified B group vitamins: Secondary | ICD-10-CM | POA: Diagnosis not present

## 2017-07-01 DIAGNOSIS — C50412 Malignant neoplasm of upper-outer quadrant of left female breast: Secondary | ICD-10-CM

## 2017-07-01 DIAGNOSIS — I10 Essential (primary) hypertension: Secondary | ICD-10-CM | POA: Diagnosis not present

## 2017-07-01 MED ORDER — AMLODIPINE BESYLATE 10 MG PO TABS: 10.0000 mg | ORAL_TABLET | Freq: Every day | ORAL | 2 refills | Status: DC

## 2017-07-01 MED ORDER — PRAVASTATIN SODIUM 20 MG PO TABS: 20.0000 mg | ORAL_TABLET | Freq: Every day | ORAL | 2 refills | Status: DC

## 2017-07-01 MED ORDER — LOSARTAN POTASSIUM 100 MG PO TABS: 100.0000 mg | ORAL_TABLET | Freq: Every day | ORAL | 2 refills | Status: DC

## 2017-07-01 NOTE — Assessment & Plan Note (Signed)
On B12 

## 2017-07-01 NOTE — Assessment & Plan Note (Signed)
Losartan, Amlodipine

## 2017-07-01 NOTE — Assessment & Plan Note (Signed)
See Cryo 

## 2017-07-01 NOTE — Assessment & Plan Note (Signed)
Tylenol prn Kasandra Knudsen

## 2017-07-01 NOTE — Assessment & Plan Note (Signed)
Aromasin

## 2017-07-01 NOTE — Patient Instructions (Signed)
   Postprocedure instructions :     Keep the wounds clean. You can wash them with liquid soap and water. Pat dry with gauze or a Kleenex tissue  Before applying antibiotic ointment and a Band-Aid.   You need to report immediately  if  any signs of infection develop.    

## 2017-07-01 NOTE — Assessment & Plan Note (Signed)
On Aromasin

## 2017-07-01 NOTE — Progress Notes (Signed)
Subjective:  Patient ID: [name] [name], female    DOB: 1933/08/15  Age: 83 y.o. MRN: [id number]  CC: No chief complaint on file.   HPI [name] [name] presents for HTN, OA, breast ca f/u C/o skin lesion on R post chest wall  Outpatient Medications Prior to Visit  Medication Sig Dispense Refill  . acetaminophen (TYLENOL) 500 MG tablet Take 1,000 mg by mouth every 6 (six) hours as needed.     Marland Kitchen acyclovir (ZOVIRAX) 800 MG tablet Take 1 tablet (800 mg total) by mouth 4 (four) times daily. 35 tablet 1  . amLODipine (NORVASC) 10 MG tablet Take 0.5 tablets (5 mg total) by mouth daily. 90 tablet 1  . amLODipine (NORVASC) 10 MG tablet TAKE 1 TABLET BY MOUTH EVERY DAY 90 tablet 1  . [facility] B[facility]12 500 MCG SUBL PLACE 2 TABLETS UNDER THE TONGUE DAILY  11  . diphenhydrAMINE (SOMINEX) 25 MG tablet Take 25 mg by mouth at bedtime as needed for sleep. Reported on 11/05/2015    . exemestane (AROMASIN) 25 MG tablet Take 25 mg by mouth daily after breakfast.    . hydrocortisone 2.5 % ointment Apply topically 2 (two) times daily. 30 g 3  . ibuprofen (ADVIL,MOTRIN) 200 MG tablet Take 1-2 tablets (200-400 mg total) by mouth 3 (three) times daily as needed. 60 tablet 0  . losartan (COZAAR) 100 MG tablet TAKE 1 TABLET (100 MG TOTAL) BY MOUTH DAILY. 90 tablet 1  . Multiple Vitamin (MULTIVITAMIN WITH MINERALS) TABS tablet Take 1 tablet by mouth daily.    . potassium chloride SA (KLOR-CON M20) 20 MEQ tablet Take 1 tablet (20 mEq total) by mouth 2 (two) times daily. 180 tablet 3  . pravastatin (PRAVACHOL) 20 MG tablet Take 1 tablet (20 mg total) by mouth daily. 90 tablet 1  . Probiotic Product (ALIGN) 4 MG CAPS Take 1 capsule (4 mg total) by mouth daily. 30 capsule 0  . valsartan-hydrochlorothiazide (DIOVAN-HCT) 320-12.5 MG tablet Take 1 tablet by mouth daily. 90 tablet 3   No facility-administered medications prior to visit.     ROS Review of Systems  Constitutional: Negative for activity change, appetite change,  chills, fatigue and unexpected weight change.  HENT: Negative for congestion, mouth sores and sinus pressure.   Eyes: Negative for visual disturbance.  Respiratory: Negative for cough and chest tightness.   Gastrointestinal: Negative for abdominal pain and nausea.  Genitourinary: Negative for difficulty urinating, frequency and vaginal pain.  Musculoskeletal: Positive for arthralgias, back pain and gait problem.  Skin: Negative for pallor and rash.  Neurological: Negative for dizziness, tremors, weakness, numbness and headaches.  Psychiatric/Behavioral: Negative for confusion and sleep disturbance.    Objective:  BP (!) 142/76 (BP Location: Right Arm, Patient Position: Sitting, Cuff Size: Large)   Pulse 68   Temp 97.8 F (36.6 C) (Oral)   Ht 5\' 6"  (1.676 m)   Wt 218 lb (98.9 kg)   SpO2 98%   BMI 35.19 kg/m   BP Readings from Last 3 Encounters:  07/01/17 (!) 142/76  12/19/16 (!) 144/86  11/03/16 118/76    Wt Readings from Last 3 Encounters:  07/01/17 218 lb (98.9 kg)  12/19/16 213 lb (96.6 kg)  11/03/16 216 lb (98 kg)    Physical Exam  Constitutional: She appears well-developed. No distress.  HENT:  Head: Normocephalic.  Right Ear: External ear normal.  Left Ear: External ear normal.  Nose: Nose normal.  Mouth/Throat: Oropharynx is clear and moist.  Eyes:  Pupils are equal, round, and reactive to light. Conjunctivae are normal. Right eye exhibits no discharge. Left eye exhibits no discharge.  Neck: Normal range of motion. Neck supple. No JVD present. No tracheal deviation present. No thyromegaly present.  Cardiovascular: Normal rate, regular rhythm and normal heart sounds.   Pulmonary/Chest: No stridor. No respiratory distress. She has no wheezes.  Abdominal: Soft. Bowel sounds are normal. She exhibits no distension and no mass. There is no tenderness. There is no rebound and no guarding.  Musculoskeletal: She exhibits no edema or tenderness.  Lymphadenopathy:    She  has no cervical adenopathy.  Neurological: She displays normal reflexes. No cranial nerve deficit. She exhibits normal muscle tone. Coordination normal.  Skin: No rash noted. No erythema.  Psychiatric: She has a normal mood and affect. Her behavior is normal. Judgment and thought content normal.  Donald Prose on back   Procedure Note :     Procedure : Cryosurgery   Indication:  Wart(s)    Risks including unsuccessful procedure , bleeding, infection, bruising, scar, a need for a repeat  procedure and others were explained to the patient in detail as well as the benefits. Informed consent was obtained verbally.    1 lesion(s)  on  R post chest  was/were treated with liquid nitrogen on a Q-tip in a usual fasion . Band-Aid was applied and antibiotic ointment was given for a later use.   Tolerated well. Complications none.     Lab Results  Component Value Date   WBC 4.4 12/22/2016   HGB 12.8 12/22/2016   HCT 38.3 12/22/2016   PLT 274.0 12/22/2016   GLUCOSE 91 12/22/2016   CHOL 282 (H) 07/17/2016   TRIG 102.0 07/17/2016   HDL 83.40 07/17/2016   LDLCALC 178 (H) 07/17/2016   ALT 9 07/17/2016   AST 15 07/17/2016   NA 140 12/22/2016   K 3.9 12/22/2016   CL 102 12/22/2016   CREATININE 0.94 12/22/2016   BUN 16 12/22/2016   CO2 30 12/22/2016   TSH 1.65 12/22/2016   INR 1.67 (H) 05/27/2014   HGBA1C 5.6 12/29/2014    Dg Bone Density  Result Date: 10/04/2014 CLINICAL DATA:  83 year old postmenopausal female. EXAM: DUAL X-RAY ABSORPTIOMETRY (DXA) FOR BONE MINERAL DENSITY FINDINGS: LEFT FEMUR NECK Bone Mineral Density (BMD):  0.941 g/cm2 Young Adult T-Score: 0.8 Z-Score:  1.7 LEFT FOREARM (1/3 RADIUS) Bone Mineral Density (BMD):  0.775 Young Adult T Score:  1.3 Z Score:  4.4 ASSESSMENT: Patient's diagnostic category is normal by WHO Criteria. FRACTURE RISK: Not increased COMPARISON: When compared to baseline evaluation 08/20/2007 there has been a statistically significant interval decrease  in bone mineral density of the left hip of 3.4%. When compared to prior evaluation 01/06/2011 there has been a statistically significant interval increase in bone mineral density of the left forearm of 4.2% and a statistically significant interval decrease in bone mineral density of the left hip of 3.4%. Effective therapies are available in the form of bisphosphonates, selective estrogen receptor modulators, biologic agents, and hormone replacement therapy (for women). All patients should ensure an adequate intake of dietary calcium (1200 mg daily) and vitamin D (800 IU daily) unless contraindicated. All treatment decisions require clinical judgment and consideration of individual patient factors, including patient preferences, co-morbidities, previous drug use, risk factors not captured in the FRAX model (e.g., frailty, falls, vitamin D deficiency, increased bone turnover, interval significant decline in bone density) and possible under- or over-estimation of fracture risk by FRAX. The  National Osteoporosis Foundation recommends that FDA-approved medical therapies be considered in postmenopausal women and men age 69 or older with a: 1. Hip or vertebral (clinical or morphometric) fracture. 2. T-score of -2.5 or lower at the spine or hip. 3. Ten-year fracture probability by FRAX of 3% or greater for hip fracture or 20% or greater for major osteoporotic fracture. People with diagnosed cases of osteoporosis or at high risk for fracture should have regular bone mineral density tests. For patients eligible for Medicare, routine testing is allowed once every 2 years. The testing frequency can be increased to one year for patients who have rapidly progressing disease, those who are receiving or discontinuing medical therapy to restore bone mass, or have additional risk factors. [facility]) Criteria: Normal: T-scores from +1.0 to -1.0 Low Bone Mass (Osteopenia): T-scores between -1.0 and -2.5  Osteoporosis: T-scores -2.5 and below Comparison to Reference Population: T-score is the key measure used in the diagnosis of osteoporosis and relative risk determination for fracture. It provides a value for bone mass relative to the mean bone mass of a young adult reference population expressed in terms of standard deviation (SD). Z-score is the age-matched score showing the patient's values compared to a population matched for age, sex, and race. This is also expressed in terms of standard deviation. The patient may have values that compare favorably to the age-matched values and still be at increased risk for fracture. Electronically Signed   By: Lovey Newcomer M.D.   On: 10/04/2014 18:28    Assessment & Plan:   There are no diagnoses linked to this encounter. I am having Ms. Vanacker maintain her acetaminophen, multivitamin with minerals, diphenhydrAMINE, hydrocortisone, [facility] B-12, ALIGN, acyclovir, exemestane, ibuprofen, amLODipine, valsartan-hydrochlorothiazide, pravastatin, amLODipine, losartan, and potassium chloride SA.  No orders of the defined types were placed in this encounter.    Follow-up: No Follow-up on file.  Walker Kehr, MD

## 2017-07-07 ENCOUNTER — Telehealth: Payer: Self-pay | Admitting: Internal Medicine

## 2017-07-07 NOTE — Telephone Encounter (Signed)
Please advise 

## 2017-07-07 NOTE — Telephone Encounter (Signed)
Pt called stating that Dr Alain Marion froze a place on her back last week and the top has fallen off and it is very painful. She said that it is black around it and wanted to know if she needed to come in for him to look at it or if he thinks it is okay.

## 2017-07-08 NOTE — Addendum Note (Signed)
Addended by: Karren Cobble on: 07/08/2017 01:58 PM   Modules accepted: Orders

## 2017-07-09 NOTE — Telephone Encounter (Signed)
Pt notified and coming in monday

## 2017-07-09 NOTE — Telephone Encounter (Signed)
It is probably ok: use abx ointment and a band aid. I'll be happy to look at it Thx

## 2017-07-13 ENCOUNTER — Encounter: Payer: Self-pay | Admitting: Internal Medicine

## 2017-07-13 ENCOUNTER — Ambulatory Visit (INDEPENDENT_AMBULATORY_CARE_PROVIDER_SITE_OTHER): Payer: Medicare Other | Admitting: Internal Medicine

## 2017-07-13 DIAGNOSIS — I10 Essential (primary) hypertension: Secondary | ICD-10-CM

## 2017-07-13 DIAGNOSIS — J029 Acute pharyngitis, unspecified: Secondary | ICD-10-CM | POA: Diagnosis not present

## 2017-07-13 DIAGNOSIS — L570 Actinic keratosis: Secondary | ICD-10-CM | POA: Insufficient documentation

## 2017-07-13 NOTE — Assessment & Plan Note (Signed)
OTC meds 

## 2017-07-13 NOTE — Progress Notes (Signed)
Subjective:  Patient ID: [name] [name], female    DOB: [birth year]  Age: 83 y.o. MRN: [id number]  CC: No chief complaint on file.   HPI [name] [name] presents for ST x 2 days C/o spot on the back - painful after cryo... F/u HTN   Outpatient Medications Prior to Visit  Medication Sig Dispense Refill  . acetaminophen (TYLENOL) 500 MG tablet Take 1,000 mg by mouth every 6 (six) hours as needed.     Marland Kitchen acyclovir (ZOVIRAX) 800 MG tablet Take 1 tablet (800 mg total) by mouth 4 (four) times daily. 35 tablet 1  . amLODipine (NORVASC) 10 MG tablet Take 1 tablet (10 mg total) by mouth daily. 90 tablet 2  . [facility] B-12 500 MCG SUBL PLACE 2 TABLETS UNDER THE TONGUE DAILY  11  . diphenhydrAMINE (SOMINEX) 25 MG tablet Take 25 mg by mouth at bedtime as needed for sleep. Reported on 11/05/2015    . exemestane (AROMASIN) 25 MG tablet Take 25 mg by mouth daily after breakfast.    . hydrocortisone 2.5 % ointment Apply topically 2 (two) times daily. 30 g 3  . ibuprofen (ADVIL,MOTRIN) 200 MG tablet Take 1-2 tablets (200-400 mg total) by mouth 3 (three) times daily as needed. 60 tablet 0  . losartan (COZAAR) 100 MG tablet Take 1 tablet (100 mg total) by mouth daily. 90 tablet 2  . Multiple Vitamin (MULTIVITAMIN WITH MINERALS) TABS tablet Take 1 tablet by mouth daily.    . potassium chloride SA (KLOR-CON M20) 20 MEQ tablet Take 1 tablet (20 mEq total) by mouth 2 (two) times daily. 180 tablet 3  . pravastatin (PRAVACHOL) 20 MG tablet Take 1 tablet (20 mg total) by mouth daily. 90 tablet 2  . Probiotic Product (ALIGN) 4 MG CAPS Take 1 capsule (4 mg total) by mouth daily. 30 capsule 0  . valsartan-hydrochlorothiazide (DIOVAN-HCT) 320-12.5 MG tablet Take 1 tablet by mouth daily. 90 tablet 3   No facility-administered medications prior to visit.     ROS Review of Systems  Constitutional: Negative for fever.  HENT: Positive for sore throat.     Objective:  BP 136/74 (BP Location: Left Arm, Patient Position:  Sitting, Cuff Size: Large)   Pulse 73   Temp 98.5 F (36.9 C) (Oral)   Ht 5\' 6"  (1.676 m)   Wt 218 lb (98.9 kg)   SpO2 98%   BMI 35.19 kg/m   BP Readings from Last 3 Encounters:  07/13/17 136/74  07/01/17 (!) 142/76  12/19/16 (!) 144/86    Wt Readings from Last 3 Encounters:  07/13/17 218 lb (98.9 kg)  07/01/17 218 lb (98.9 kg)  12/19/16 213 lb (96.6 kg)    Physical Exam  Constitutional: She appears well-developed. No distress.  HENT:  Head: Normocephalic.  Right Ear: External ear normal.  Left Ear: External ear normal.  Nose: Nose normal.  Mouth/Throat: Oropharynx is clear and moist.  Eyes: Conjunctivae are normal. Pupils are equal, round, and reactive to light. Right eye exhibits no discharge. Left eye exhibits no discharge.  Neck: Normal range of motion. Neck supple. No JVD present. No tracheal deviation present. No thyromegaly present.  Cardiovascular: Normal rate, regular rhythm and normal heart sounds.  Pulmonary/Chest: No stridor. No respiratory distress. She has no wheezes.  Abdominal: Soft. Bowel sounds are normal. She exhibits no distension and no mass. There is no tenderness. There is no rebound and no guarding.  Musculoskeletal: She exhibits no edema or tenderness.  Lymphadenopathy:  She has no cervical adenopathy.  Neurological: She displays normal reflexes. No cranial nerve deficit. She exhibits normal muscle tone. Coordination normal.  Skin: No rash noted. No erythema.  Psychiatric: She has a normal mood and affect. Her behavior is normal. Judgment and thought content normal.  eryth throat Healing lesion on R back    Lab Results  Component Value Date   WBC 4.4 12/22/2016   HGB 12.8 12/22/2016   HCT 38.3 12/22/2016   PLT 274.0 12/22/2016   GLUCOSE 91 12/22/2016   CHOL 282 (H) 07/17/2016   TRIG 102.0 07/17/2016   HDL 83.40 07/17/2016   LDLCALC 178 (H) 07/17/2016   ALT 9 07/17/2016   AST 15 07/17/2016   NA 140 12/22/2016   K 3.9 12/22/2016    CL 102 12/22/2016   CREATININE 0.94 12/22/2016   BUN 16 12/22/2016   CO2 30 12/22/2016   TSH 1.65 12/22/2016   INR 1.67 (H) 05/27/2014   HGBA1C 5.6 12/29/2014    Dg Bone Density  Result Date: 10/04/2014 CLINICAL DATA:  83 year old postmenopausal female. EXAM: DUAL X-RAY ABSORPTIOMETRY (DXA) FOR BONE MINERAL DENSITY FINDINGS: LEFT FEMUR NECK Bone Mineral Density (BMD):  0.941 g/cm2 Young Adult T-Score: 0.8 Z-Score:  1.7 LEFT FOREARM (1/3 RADIUS) Bone Mineral Density (BMD):  0.775 Young Adult T Score:  1.3 Z Score:  4.4 ASSESSMENT: Patient's diagnostic category is normal by WHO Criteria. FRACTURE RISK: Not increased COMPARISON: When compared to baseline evaluation 08/20/2007 there has been a statistically significant interval decrease in bone mineral density of the left hip of 3.4%. When compared to prior evaluation 01/06/2011 there has been a statistically significant interval increase in bone mineral density of the left forearm of 4.2% and a statistically significant interval decrease in bone mineral density of the left hip of 3.4%. Effective therapies are available in the form of bisphosphonates, selective estrogen receptor modulators, biologic agents, and hormone replacement therapy (for women). All patients should ensure an adequate intake of dietary calcium (1200 mg daily) and vitamin D (800 IU daily) unless contraindicated. All treatment decisions require clinical judgment and consideration of individual patient factors, including patient preferences, co-morbidities, previous drug use, risk factors not captured in the FRAX model (e.g., frailty, falls, vitamin D deficiency, increased bone turnover, interval significant decline in bone density) and possible under- or over-estimation of fracture risk by FRAX. The National Osteoporosis Foundation recommends that FDA-approved medical therapies be considered in postmenopausal women and men age 66 or older with a: 1. Hip or vertebral (clinical or  morphometric) fracture. 2. T-score of -2.5 or lower at the spine or hip. 3. Ten-year fracture probability by FRAX of 3% or greater for hip fracture or 20% or greater for major osteoporotic fracture. People with diagnosed cases of osteoporosis or at high risk for fracture should have regular bone mineral density tests. For patients eligible for Medicare, routine testing is allowed once every 2 years. The testing frequency can be increased to one year for patients who have rapidly progressing disease, those who are receiving or discontinuing medical therapy to restore bone mass, or have additional risk factors. [facility]) Criteria: Normal: T-scores from +1.0 to -1.0 Low Bone Mass (Osteopenia): T-scores between -1.0 and -2.5 Osteoporosis: T-scores -2.5 and below Comparison to Reference Population: T-score is the key measure used in the diagnosis of osteoporosis and relative risk determination for fracture. It provides a value for bone mass relative to the mean bone mass of a young adult reference population expressed in terms of  standard deviation (SD). Z-score is the age-matched score showing the patient's values compared to a population matched for age, sex, and race. This is also expressed in terms of standard deviation. The patient may have values that compare favorably to the age-matched values and still be at increased risk for fracture. Electronically Signed   By: Lovey Newcomer M.D.   On: 10/04/2014 18:28    Assessment & Plan:   There are no diagnoses linked to this encounter. I am having [name] [name] maintain her acetaminophen, multivitamin with minerals, diphenhydrAMINE, hydrocortisone, [facility] B-12, ALIGN, acyclovir, exemestane, ibuprofen, valsartan-hydrochlorothiazide, potassium chloride SA, pravastatin, losartan, and amLODipine.  No orders of the defined types were placed in this encounter.    Follow-up: No Follow-up on file.  Walker Kehr, MD

## 2017-07-13 NOTE — Assessment & Plan Note (Signed)
Re treated

## 2017-07-13 NOTE — Assessment & Plan Note (Signed)
Losartan, Amlodipine

## 2017-07-13 NOTE — Patient Instructions (Signed)
You can use over-the-counter  "cold" medicines  such as " Delsym" or" Robitussin" cough syrup varietis for cough.  You can use plain "Tylenol" or "Advil" for fever, chills and achyness. Use Halls or Ricola cough drops. ° ° "Common cold" symptoms are usually triggered by a virus.  The antibiotics are usually not necessary. On average, a" viral cold" illness would take 4-7 days to resolve. ° ° Please, make an appointment if you are not better or if you're worse. ° °

## 2017-09-25 ENCOUNTER — Encounter: Payer: Self-pay | Admitting: Internal Medicine

## 2017-09-25 ENCOUNTER — Ambulatory Visit (INDEPENDENT_AMBULATORY_CARE_PROVIDER_SITE_OTHER): Payer: Medicare Other | Admitting: Internal Medicine

## 2017-09-25 DIAGNOSIS — K1120 Sialoadenitis, unspecified: Secondary | ICD-10-CM | POA: Diagnosis not present

## 2017-09-25 MED ORDER — CEPHALEXIN 500 MG PO CAPS: 1000.0000 mg | ORAL_CAPSULE | Freq: Two times a day (BID) | ORAL | 1 refills | Status: DC

## 2017-09-25 NOTE — Patient Instructions (Signed)
Water w/lemon ENT referral  if not resolved

## 2017-09-25 NOTE — Assessment & Plan Note (Signed)
PO abx Water w/lemon ENT ref if not resolved

## 2017-09-25 NOTE — Progress Notes (Signed)
Subjective:  Patient ID: [name] [name], female    DOB: 08-Jul-1933  Age: 84 y.o. MRN: [id number]  CC: No chief complaint on file.   HPI [name] [name] presents for the swelling under the chin on the R x2 mo off and on; no pattern No fever, redness  Outpatient Medications Prior to Visit  Medication Sig Dispense Refill  . acetaminophen (TYLENOL) 500 MG tablet Take 1,000 mg by mouth every 6 (six) hours as needed.     Marland Kitchen acyclovir (ZOVIRAX) 800 MG tablet Take 1 tablet (800 mg total) by mouth 4 (four) times daily. 35 tablet 1  . amLODipine (NORVASC) 10 MG tablet Take 1 tablet (10 mg total) by mouth daily. 90 tablet 2  . [facility] B-12 500 MCG SUBL PLACE 2 TABLETS UNDER THE TONGUE DAILY  11  . diphenhydrAMINE (SOMINEX) 25 MG tablet Take 25 mg by mouth at bedtime as needed for sleep. Reported on 11/05/2015    . exemestane (AROMASIN) 25 MG tablet Take 25 mg by mouth daily after breakfast.    . hydrocortisone 2.5 % ointment Apply topically 2 (two) times daily. 30 g 3  . ibuprofen (ADVIL,MOTRIN) 200 MG tablet Take 1-2 tablets (200-400 mg total) by mouth 3 (three) times daily as needed. 60 tablet 0  . losartan (COZAAR) 100 MG tablet Take 1 tablet (100 mg total) by mouth daily. 90 tablet 2  . Multiple Vitamin (MULTIVITAMIN WITH MINERALS) TABS tablet Take 1 tablet by mouth daily.    . potassium chloride SA (KLOR-CON M20) 20 MEQ tablet Take 1 tablet (20 mEq total) by mouth 2 (two) times daily. 180 tablet 3  . pravastatin (PRAVACHOL) 20 MG tablet Take 1 tablet (20 mg total) by mouth daily. 90 tablet 2  . Probiotic Product (ALIGN) 4 MG CAPS Take 1 capsule (4 mg total) by mouth daily. 30 capsule 0  . valsartan-hydrochlorothiazide (DIOVAN-HCT) 320-12.5 MG tablet Take 1 tablet by mouth daily. 90 tablet 3   No facility-administered medications prior to visit.     ROS Review of Systems  Constitutional: Negative for activity change, appetite change, chills, fatigue and unexpected weight change.  HENT: Negative  for congestion, mouth sores and sinus pressure.   Eyes: Negative for visual disturbance.  Respiratory: Negative for cough and chest tightness.   Gastrointestinal: Negative for abdominal pain and nausea.  Genitourinary: Negative for difficulty urinating, frequency and vaginal pain.  Musculoskeletal: Negative for back pain and gait problem.  Skin: Negative for pallor and rash.  Neurological: Negative for dizziness, tremors, weakness, numbness and headaches.  Psychiatric/Behavioral: Negative for confusion and sleep disturbance.    Objective:  BP 132/84 (BP Location: Right Arm, Patient Position: Sitting, Cuff Size: Large)   Pulse (!) 55   Temp 98.1 F (36.7 C) (Oral)   Ht 5\' 6"  (1.676 m)   Wt 216 lb (98 kg)   SpO2 99%   BMI 34.86 kg/m   BP Readings from Last 3 Encounters:  09/25/17 132/84  07/13/17 136/74  07/01/17 (!) 142/76    Wt Readings from Last 3 Encounters:  09/25/17 216 lb (98 kg)  07/13/17 218 lb (98.9 kg)  07/01/17 218 lb (98.9 kg)    Physical Exam  Constitutional: She appears well-developed. No distress.  HENT:  Head: Normocephalic.  Right Ear: External ear normal.  Left Ear: External ear normal.  Nose: Nose normal.  Mouth/Throat: Oropharynx is clear and moist.  Eyes: Conjunctivae are normal. Pupils are equal, round, and reactive to light. Right eye exhibits  no discharge. Left eye exhibits no discharge.  Neck: Normal range of motion. Neck supple. No JVD present. No tracheal deviation present. No thyromegaly present.  Cardiovascular: Normal rate, regular rhythm and normal heart sounds.  Pulmonary/Chest: No stridor. No respiratory distress. She has no wheezes.  Abdominal: Soft. Bowel sounds are normal. She exhibits no distension and no mass. There is no tenderness. There is no rebound and no guarding.  Musculoskeletal: She exhibits no edema or tenderness.  Lymphadenopathy:    She has no cervical adenopathy.  Neurological: She displays normal reflexes. No cranial  nerve deficit. She exhibits normal muscle tone. Coordination normal.  Skin: No rash noted. No erythema.  Psychiatric: She has a normal mood and affect. Her behavior is normal. Judgment and thought content normal.  R submand salivary gland is swollen  Lab Results  Component Value Date   WBC 4.4 12/22/2016   HGB 12.8 12/22/2016   HCT 38.3 12/22/2016   PLT 274.0 12/22/2016   GLUCOSE 91 12/22/2016   CHOL 282 (H) 07/17/2016   TRIG 102.0 07/17/2016   HDL 83.40 07/17/2016   LDLCALC 178 (H) 07/17/2016   ALT 9 07/17/2016   AST 15 07/17/2016   NA 140 12/22/2016   K 3.9 12/22/2016   CL 102 12/22/2016   CREATININE 0.94 12/22/2016   BUN 16 12/22/2016   CO2 30 12/22/2016   TSH 1.65 12/22/2016   INR 1.67 (H) 05/27/2014   HGBA1C 5.6 12/29/2014    Dg Bone Density  Result Date: 10/04/2014 CLINICAL DATA:  85 year old postmenopausal female. EXAM: DUAL X-RAY ABSORPTIOMETRY (DXA) FOR BONE MINERAL DENSITY FINDINGS: LEFT FEMUR NECK Bone Mineral Density (BMD):  0.941 g/cm2 Young Adult T-Score: 0.8 Z-Score:  1.7 LEFT FOREARM (1/3 RADIUS) Bone Mineral Density (BMD):  0.775 Young Adult T Score:  1.3 Z Score:  4.4 ASSESSMENT: Patient's diagnostic category is normal by WHO Criteria. FRACTURE RISK: Not increased COMPARISON: When compared to baseline evaluation 08/20/2007 there has been a statistically significant interval decrease in bone mineral density of the left hip of 3.4%. When compared to prior evaluation 01/06/2011 there has been a statistically significant interval increase in bone mineral density of the left forearm of 4.2% and a statistically significant interval decrease in bone mineral density of the left hip of 3.4%. Effective therapies are available in the form of bisphosphonates, selective estrogen receptor modulators, biologic agents, and hormone replacement therapy (for women). All patients should ensure an adequate intake of dietary calcium (1200 mg daily) and vitamin D (800 IU daily) unless  contraindicated. All treatment decisions require clinical judgment and consideration of individual patient factors, including patient preferences, co-morbidities, previous drug use, risk factors not captured in the FRAX model (e.g., frailty, falls, vitamin D deficiency, increased bone turnover, interval significant decline in bone density) and possible under- or over-estimation of fracture risk by FRAX. The National Osteoporosis Foundation recommends that FDA-approved medical therapies be considered in postmenopausal women and men age 42 or older with a: 1. Hip or vertebral (clinical or morphometric) fracture. 2. T-score of -2.5 or lower at the spine or hip. 3. Ten-year fracture probability by FRAX of 3% or greater for hip fracture or 20% or greater for major osteoporotic fracture. People with diagnosed cases of osteoporosis or at high risk for fracture should have regular bone mineral density tests. For patients eligible for Medicare, routine testing is allowed once every 2 years. The testing frequency can be increased to one year for patients who have rapidly progressing disease, those who are receiving or  discontinuing medical therapy to restore bone mass, or have additional risk factors. [employer] Pharmac[employer] [facility]) Criteria: Normal: T-scores from +1.0 to -1.0 Low Bone Mass (Osteopenia): T-scores between -1.0 and -2.5 Osteoporosis: T-scores -2.5 and below Comparison to Reference Population: T-score is the key measure used in the diagnosis of osteoporosis and relative risk determination for fracture. It provides a value for bone mass relative to the mean bone mass of a young adult reference population expressed in terms of standard deviation (SD). Z-score is the age-matched score showing the patient's values compared to a population matched for age, sex, and race. This is also expressed in terms of standard deviation. The patient may have values that compare favorably to the age-matched values and still be at  increased risk for fracture. Electronically Signed   By: Lovey Newcomer M.D.   On: 10/04/2014 18:28    Assessment & Plan:   There are no diagnoses linked to this encounter. I am having [name] [name] maintain her acetaminophen, multivitamin with minerals, diphenhydrAMINE, hydrocortisone, [facility]S B-12, ALIGN, acyclovir, exemestane, ibuprofen, valsartan-hydrochlorothiazide, potassium chloride SA, pravastatin, losartan, and amLODipine.  No orders of the defined types were placed in this encounter.    Follow-up: No Follow-up on file.  Walker Kehr, MD

## 2017-10-14 ENCOUNTER — Encounter: Payer: Self-pay | Admitting: Internal Medicine

## 2017-10-14 ENCOUNTER — Ambulatory Visit (INDEPENDENT_AMBULATORY_CARE_PROVIDER_SITE_OTHER): Payer: Medicare Other | Admitting: Internal Medicine

## 2017-10-14 DIAGNOSIS — K1120 Sialoadenitis, unspecified: Secondary | ICD-10-CM

## 2017-10-14 MED ORDER — CEPHALEXIN 500 MG PO CAPS: 1000.0000 mg | ORAL_CAPSULE | Freq: Two times a day (BID) | ORAL | 0 refills | Status: DC

## 2017-10-14 NOTE — Progress Notes (Signed)
Subjective:  Patient ID: [name] [name], female    DOB: October 17, 1932  Age: 84 y.o. MRN: [id number]  CC: No chief complaint on file.   HPI [name] [name] presents for swelling under the R jaw off and on, ST C/o dry lips  Outpatient Medications Prior to Visit  Medication Sig Dispense Refill  . acetaminophen (TYLENOL) 500 MG tablet Take 1,000 mg by mouth every 6 (six) hours as needed.     Marland Kitchen acyclovir (ZOVIRAX) 800 MG tablet Take 1 tablet (800 mg total) by mouth 4 (four) times daily. 35 tablet 1  . amLODipine (NORVASC) 10 MG tablet Take 1 tablet (10 mg total) by mouth daily. 90 tablet 2  . cephALEXin (KEFLEX) 500 MG capsule Take 2 capsules (1,000 mg total) by mouth 2 (two) times daily. 40 capsule 1  . [facility] B-12 500 MCG SUBL PLACE 2 TABLETS UNDER THE TONGUE DAILY  11  . diphenhydrAMINE (SOMINEX) 25 MG tablet Take 25 mg by mouth at bedtime as needed for sleep. Reported on 11/05/2015    . exemestane (AROMASIN) 25 MG tablet Take 25 mg by mouth daily after breakfast.    . hydrocortisone 2.5 % ointment Apply topically 2 (two) times daily. 30 g 3  . ibuprofen (ADVIL,MOTRIN) 200 MG tablet Take 1-2 tablets (200-400 mg total) by mouth 3 (three) times daily as needed. 60 tablet 0  . losartan (COZAAR) 100 MG tablet Take 1 tablet (100 mg total) by mouth daily. 90 tablet 2  . Multiple Vitamin (MULTIVITAMIN WITH MINERALS) TABS tablet Take 1 tablet by mouth daily.    . potassium chloride SA (KLOR-CON M20) 20 MEQ tablet Take 1 tablet (20 mEq total) by mouth 2 (two) times daily. 180 tablet 3  . pravastatin (PRAVACHOL) 20 MG tablet Take 1 tablet (20 mg total) by mouth daily. 90 tablet 2  . Probiotic Product (ALIGN) 4 MG CAPS Take 1 capsule (4 mg total) by mouth daily. 30 capsule 0  . valsartan-hydrochlorothiazide (DIOVAN-HCT) 320-12.5 MG tablet Take 1 tablet by mouth daily. 90 tablet 3   No facility-administered medications prior to visit.     ROS Review of Systems  Constitutional: Negative for activity  change, appetite change, chills, fatigue and unexpected weight change.  HENT: Positive for sore throat. Negative for congestion, mouth sores and sinus pressure.   Eyes: Negative for visual disturbance.  Respiratory: Negative for cough and chest tightness.   Gastrointestinal: Negative for abdominal pain and nausea.  Genitourinary: Negative for difficulty urinating, frequency and vaginal pain.  Musculoskeletal: Negative for back pain and gait problem.  Skin: Negative for pallor and rash.  Neurological: Negative for dizziness, tremors, weakness, numbness and headaches.  Psychiatric/Behavioral: Negative for confusion and sleep disturbance.    Objective:  BP 138/82 (BP Location: Right Arm, Patient Position: Sitting, Cuff Size: Large)   Pulse 60   Temp 98.2 F (36.8 C) (Oral)   Ht 5\' 6"  (1.676 m)   Wt 218 lb (98.9 kg)   SpO2 98%   BMI 35.19 kg/m   BP Readings from Last 3 Encounters:  10/14/17 138/82  09/25/17 132/84  07/13/17 136/74    Wt Readings from Last 3 Encounters:  10/14/17 218 lb (98.9 kg)  09/25/17 216 lb (98 kg)  07/13/17 218 lb (98.9 kg)    Physical Exam  Constitutional: She appears well-developed. No distress.  HENT:  Head: Normocephalic.  Right Ear: External ear normal.  Left Ear: External ear normal.  Nose: Nose normal.  Mouth/Throat: Oropharynx is clear  and moist.  Eyes: Conjunctivae are normal. Pupils are equal, round, and reactive to light. Right eye exhibits no discharge. Left eye exhibits no discharge.  Neck: Normal range of motion. Neck supple. No JVD present. No tracheal deviation present. No thyromegaly present.  Cardiovascular: Normal rate, regular rhythm and normal heart sounds.  Pulmonary/Chest: No stridor. No respiratory distress. She has no wheezes.  Abdominal: Soft. Bowel sounds are normal. She exhibits no distension and no mass. There is no tenderness. There is no rebound and no guarding.  Musculoskeletal: She exhibits no edema or tenderness.    Lymphadenopathy:    She has no cervical adenopathy.  Neurological: She displays normal reflexes. No cranial nerve deficit. She exhibits normal muscle tone. Coordination normal.  Skin: No rash noted. No erythema.  Psychiatric: She has a normal mood and affect. Her behavior is normal. Judgment and thought content normal.  R saliv gland is enlarged Obese  Lab Results  Component Value Date   WBC 4.4 12/22/2016   HGB 12.8 12/22/2016   HCT 38.3 12/22/2016   PLT 274.0 12/22/2016   GLUCOSE 91 12/22/2016   CHOL 282 (H) 07/17/2016   TRIG 102.0 07/17/2016   HDL 83.40 07/17/2016   LDLCALC 178 (H) 07/17/2016   ALT 9 07/17/2016   AST 15 07/17/2016   NA 140 12/22/2016   K 3.9 12/22/2016   CL 102 12/22/2016   CREATININE 0.94 12/22/2016   BUN 16 12/22/2016   CO2 30 12/22/2016   TSH 1.65 12/22/2016   INR 1.67 (H) 05/27/2014   HGBA1C 5.6 12/29/2014    Dg Bone Density  Result Date: 10/04/2014 CLINICAL DATA:  84 year old postmenopausal female. EXAM: DUAL X-RAY ABSORPTIOMETRY (DXA) FOR BONE MINERAL DENSITY FINDINGS: LEFT FEMUR NECK Bone Mineral Density (BMD):  0.941 g/cm2 Young Adult T-Score: 0.8 Z-Score:  1.7 LEFT FOREARM (1/3 RADIUS) Bone Mineral Density (BMD):  0.775 Young Adult T Score:  1.3 Z Score:  4.4 ASSESSMENT: Patient's diagnostic category is normal by WHO Criteria. FRACTURE RISK: Not increased COMPARISON: When compared to baseline evaluation 08/20/2007 there has been a statistically significant interval decrease in bone mineral density of the left hip of 3.4%. When compared to prior evaluation 01/06/2011 there has been a statistically significant interval increase in bone mineral density of the left forearm of 4.2% and a statistically significant interval decrease in bone mineral density of the left hip of 3.4%. Effective therapies are available in the form of bisphosphonates, selective estrogen receptor modulators, biologic agents, and hormone replacement therapy (for women). All patients  should ensure an adequate intake of dietary calcium (1200 mg daily) and vitamin D (800 IU daily) unless contraindicated. All treatment decisions require clinical judgment and consideration of individual patient factors, including patient preferences, co-morbidities, previous drug use, risk factors not captured in the FRAX model (e.g., frailty, falls, vitamin D deficiency, increased bone turnover, interval significant decline in bone density) and possible under- or over-estimation of fracture risk by FRAX. The National Osteoporosis Foundation recommends that FDA-approved medical therapies be considered in postmenopausal women and men age 28 or older with a: 1. Hip or vertebral (clinical or morphometric) fracture. 2. T-score of -2.5 or lower at the spine or hip. 3. Ten-year fracture probability by FRAX of 3% or greater for hip fracture or 20% or greater for major osteoporotic fracture. People with diagnosed cases of osteoporosis or at high risk for fracture should have regular bone mineral density tests. For patients eligible for Medicare, routine testing is allowed once every 2 years. The testing  frequency can be increased to one year for patients who have rapidly progressing disease, those who are receiving or discontinuing medical therapy to restore bone mass, or have additional risk factors. World Pharmacologist [facility]) Criteria: Normal: T-scores from +1.0 to -1.0 Low Bone Mass (Osteopenia): T-scores between -1.0 and -2.5 Osteoporosis: T-scores -2.5 and below Comparison to Reference Population: T-score is the key measure used in the diagnosis of osteoporosis and relative risk determination for fracture. It provides a value for bone mass relative to the mean bone mass of a young adult reference population expressed in terms of standard deviation (SD). Z-score is the age-matched score showing the patient's values compared to a population matched for age, sex, and race. This is also expressed in terms of standard  deviation. The patient may have values that compare favorably to the age-matched values and still be at increased risk for fracture. Electronically Signed   By: Lovey Newcomer M.D.   On: 10/04/2014 18:28    Assessment & Plan:   There are no diagnoses linked to this encounter. I am having [name] [name] maintain her acetaminophen, multivitamin with minerals, diphenhydrAMINE, hydrocortisone, CVS B-12, ALIGN, acyclovir, exemestane, ibuprofen, valsartan-hydrochlorothiazide, potassium chloride SA, pravastatin, losartan, amLODipine, and cephALEXin.  No orders of the defined types were placed in this encounter.    Follow-up: No Follow-up on file.  Walker Kehr, MD

## 2017-10-14 NOTE — Patient Instructions (Signed)
Cortaid cream to dry lips

## 2017-10-14 NOTE — Assessment & Plan Note (Signed)
Keflex if worse Recurrent sx's - ENT ref

## 2017-10-23 DIAGNOSIS — J029 Acute pharyngitis, unspecified: Secondary | ICD-10-CM | POA: Diagnosis not present

## 2017-10-23 DIAGNOSIS — R590 Localized enlarged lymph nodes: Secondary | ICD-10-CM | POA: Diagnosis not present

## 2017-12-09 DIAGNOSIS — L989 Disorder of the skin and subcutaneous tissue, unspecified: Secondary | ICD-10-CM | POA: Diagnosis not present

## 2017-12-09 DIAGNOSIS — R300 Dysuria: Secondary | ICD-10-CM | POA: Diagnosis not present

## 2017-12-31 NOTE — Progress Notes (Addendum)
Subjective:   [name] [name] is a 84 y.o. female who presents for Medicare Annual (Subsequent) preventive examination.  Review of Systems:  No ROS.  Medicare Wellness Visit. Additional risk factors are reflected in the social history.  Cardiac Risk Factors include: advanced age (>39men, >82 women);dyslipidemia;hypertension Sleep patterns:  gets up 2-3 times nightly to void and sleeps 5-6 hours nightly.  Patient reports insomnia issues, discussed recommended sleep tips.   Home Safety/Smoke Alarms: Feels safe in home. Smoke alarms in place.  Living environment; residence and Firearm Safety: 1-story house/ trailer, no firearms. Lives alone, no needs for DME, good support system Seat Belt Safety/Bike Helmet: Wears seat belt.     Objective:     Vitals: BP (!) 148/68   Pulse 61   Resp 18   Ht 5\' 6"  (1.676 m)   Wt 215 lb (97.5 kg)   SpO2 100%   BMI 34.70 kg/m   Body mass index is 34.7 kg/m.  Advanced Directives 01/01/2018 07/17/2014 05/24/2014 05/18/2014  Does Patient Have a Medical Advance Directive? Yes No No No  Type of Paramedic of [place];Living will - - -  Copy of [name] in Chart? No - copy requested - - -  Would patient like information on creating a medical advance directive? - Yes - Educational materials given No - patient declined information -    Tobacco Social History   Tobacco Use  Smoking Status Never Smoker  Smokeless Tobacco Never Used     Counseling given: Not Answered  Past Medical History:  Diagnosis Date  . Acute lymphadenitis of arm   . Anemia    iron deficiency  . Cancer (HCC)    breast, skin  . Depression 2009   grief  . Diverticulosis of colon 2004   Dr Deatra Ina  . GERD (gastroesophageal reflux disease)   . Heart murmur   . Hematuria    Dr Terance Hart  . Hyperlipidemia   . Hypertension   . Osteoarthritis, knee   . Vitamin B12 deficiency   . Vitamin D deficiency    Past Surgical History:    Procedure Laterality Date  . ABDOMINAL HYSTERECTOMY    . JOINT REPLACEMENT    . L groin skin cancer  2011  . MASTECTOMY  09-13-08   left and right  . TOTAL KNEE ARTHROPLASTY  06-04-98  . TOTAL KNEE ARTHROPLASTY Right 05/24/2014   Procedure: RIGHT TOTAL KNEE ARTHROPLASTY;  Surgeon: Newt Minion, MD;  Location: [facility];  Service: Orthopedics;  Laterality: Right;   Family History  Problem Relation Age of Onset  . Cancer Sister        breast  . Cancer Maternal Aunt        breast  . Coronary artery disease Other   . Cancer Other        aunt, niece   Social History   Socioeconomic History  . Marital status: Widowed    Spouse name: Not on file  . Number of children: 7  . Years of education: Not on file  . Highest education level: Not on file  Occupational History  . Not on file  Social Needs  . Financial resource strain: Not hard at all  . Food insecurity:    Worry: Never true    Inability: Never true  . Transportation needs:    Medical: No    Non-medical: No  Tobacco Use  . Smoking status: Never Smoker  . Smokeless tobacco: Never Used  Substance and Sexual Activity  . Alcohol use: No  . Drug use: No  . Sexual activity: Never  Lifestyle  . Physical activity:    Days per week: 3 days    Minutes per session: 50 min  . Stress: Not at all  Relationships  . Social connections:    Talks on phone: More than three times a week    Gets together: More than three times a week    Attends religious service: More than 4 times per year    Active member of club or organization: Yes    Attends meetings of clubs or organizations: More than 4 times per year    Relationship status: Widowed  Other Topics Concern  . Not on file  Social History Narrative  . Not on file    Outpatient Encounter Medications as of 01/01/2018  Medication Sig  . acetaminophen (TYLENOL) 500 MG tablet Take 1,000 mg by mouth every 6 (six) hours as needed.   Marland Kitchen amLODipine (NORVASC) 10 MG tablet Take 1 tablet  (10 mg total) by mouth daily.  . [employer] B-12 500 MCG SUBL PLACE 2 TABLETS UNDER THE TONGUE DAILY  . diphenhydrAMINE (SOMINEX) 25 MG tablet Take 25 mg by mouth at bedtime as needed for sleep. Reported on 11/05/2015  . exemestane (AROMASIN) 25 MG tablet Take 25 mg by mouth daily after breakfast.  . hydrocortisone 2.5 % ointment Apply topically 2 (two) times daily.  Marland Kitchen ibuprofen (ADVIL,MOTRIN) 200 MG tablet Take 1-2 tablets (200-400 mg total) by mouth 3 (three) times daily as needed.  Marland Kitchen losartan (COZAAR) 100 MG tablet Take 1 tablet (100 mg total) by mouth daily.  . Multiple Vitamin (MULTIVITAMIN WITH MINERALS) TABS tablet Take 1 tablet by mouth daily.  . potassium chloride SA (KLOR-CON M20) 20 MEQ tablet Take 1 tablet (20 mEq total) by mouth 2 (two) times daily.  . pravastatin (PRAVACHOL) 20 MG tablet Take 1 tablet (20 mg total) by mouth daily.  . Probiotic Product (ALIGN) 4 MG CAPS Take 1 capsule (4 mg total) by mouth daily.  . valsartan-hydrochlorothiazide (DIOVAN-HCT) 320-12.5 MG tablet Take 1 tablet by mouth daily.  . [DISCONTINUED] acyclovir (ZOVIRAX) 800 MG tablet Take 1 tablet (800 mg total) by mouth 4 (four) times daily. (Patient not taking: Reported on 01/01/2018)  . [DISCONTINUED] cephALEXin (KEFLEX) 500 MG capsule Take 2 capsules (1,000 mg total) by mouth 2 (two) times daily. (Patient not taking: Reported on 01/01/2018)   No facility-administered encounter medications on file as of 01/01/2018.     Activities of Daily Living In your present state of health, do you have any difficulty performing the following activities: 01/01/2018  Hearing? N  Vision? N  Difficulty concentrating or making decisions? N  Walking or climbing stairs? N  Dressing or bathing? N  Doing errands, shopping? N  Preparing Food and eating ? N  Using the Toilet? N  In the past six months, have you accidently leaked urine? Y  Do you have problems with loss of bowel control? N  Managing your Medications? N  Managing your  Finances? N  Housekeeping or managing your Housekeeping? N  Some recent data might be hidden    Patient Care Team: Plotnikov, Evie Lacks, MD as PCP - General (Internal Medicine)    Assessment:   This is a routine wellness examination for [name] [name]. Physical assessment deferred to PCP.   Exercise Activities and Dietary recommendations Current Exercise Habits: Home exercise routine, Type of exercise: walking, Time (Minutes): 30, Frequency (Times/Week):  4, Weekly Exercise (Minutes/Week): 120, Intensity: Mild, Exercise limited by: orthopedic condition(s)  Diet (meal preparation, eat out, water intake, caffeinated beverages, dairy products, fruits and vegetables): in general, a "healthy" diet  , well balanced , eats a variety of fruits and vegetables daily, limits salt, fat/cholesterol, sugar,carbohydrates,caffeine, drinks 4-5 glasses of water daily.  Reviewed heart healthy and diabetic diet, encouraged patient to increase daily water intake.  Goals    . Patient Stated     Stay as healthy and independent as possible by staying physically active, eating healthy, enjoying life and family.       Fall Risk Fall Risk  01/01/2018 12/19/2016 09/24/2015 04/23/2015  Falls in the past year? No No No No   Depression Screen PHQ 2/9 Scores 01/01/2018 12/19/2016 09/24/2015  PHQ - 2 Score 1 1 0  PHQ- 9 Score 3 - -     Cognitive Function MMSE - Mini Mental State Exam 01/01/2018  Orientation to time 5  Orientation to Place 5  Registration 3  Attention/ Calculation 3  Recall 2  Language- name 2 objects 2  Language- repeat 1  Language- follow 3 step command 3  Language- read & follow direction 1  Write a sentence 1  Copy design 1  Total score 27        Immunization History  Administered Date(s) Administered  . Influenza Whole 05/23/2008, 07/18/2009, 05/09/2010  . Influenza, High Dose Seasonal PF 06/17/2013, 07/17/2016, 07/01/2017  . Influenza,inj,Quad PF,6+ Mos 05/25/2014, 05/25/2015  .  Pneumococcal Conjugate-13 01/01/2018  . Pneumococcal Polysaccharide-23 10/24/2009  . Td 02/09/2013  . Zoster 12/29/2014   Screening Tests Health Maintenance  Topic Date Due  . PNA vac Low Risk Adult (2 of 2 - PCV13) 10/24/2010  . INFLUENZA VACCINE  04/01/2018  . TETANUS/TDAP  02/10/2023  . DEXA SCAN  Completed      Plan:    Continue doing brain stimulating activities (puzzles, reading, adult coloring books, staying active) to keep memory sharp.   Continue to eat heart healthy diet (full of fruits, vegetables, whole grains, lean protein, water--limit salt, fat, and sugar intake) and increase physical activity as tolerated.  I have personally reviewed and noted the following in the patient's chart:   . Medical and social history . Use of alcohol, tobacco or illicit drugs  . Current medications and supplements . Functional ability and status . Nutritional status . Physical activity . Advanced directives . List of other physicians . Vitals . Screenings to include cognitive, depression, and falls . Referrals and appointments  In addition, I have reviewed and discussed with patient certain preventive protocols, quality metrics, and best practice recommendations. A written personalized care plan for preventive services as well as general preventive health recommendations were provided to patient.     Michiel Cowboy, RN  01/01/2018  Medical screening examination/treatment/procedure(s) were performed by non-physician practitioner and as supervising physician I was immediately available for consultation/collaboration. I agree with above. Lew Dawes, MD

## 2018-01-01 ENCOUNTER — Ambulatory Visit (INDEPENDENT_AMBULATORY_CARE_PROVIDER_SITE_OTHER): Payer: Medicare Other | Admitting: *Deleted

## 2018-01-01 VITALS — BP 148/68 | HR 61 | Resp 18 | Ht 66.0 in | Wt 215.0 lb

## 2018-01-01 DIAGNOSIS — Z0000 Encounter for general adult medical examination without abnormal findings: Secondary | ICD-10-CM | POA: Diagnosis not present

## 2018-01-01 DIAGNOSIS — Z23 Encounter for immunization: Secondary | ICD-10-CM | POA: Diagnosis not present

## 2018-01-01 NOTE — Patient Instructions (Addendum)
Continue doing brain stimulating activities (puzzles, reading, adult coloring books, staying active) to keep memory sharp.   Continue to eat heart healthy diet (full of fruits, vegetables, whole grains, lean protein, water--limit salt, fat, and sugar intake) and increase physical activity as tolerated.   [name] [name] , Thank you for taking time to come for your Medicare Wellness Visit. I appreciate your ongoing commitment to your health goals. Please review the following plan we discussed and let me know if I can assist you in the future.   These are the goals we discussed: Goals    . Patient Stated     Stay as healthy and independent as possible by staying physically active, eating healthy, enjoying life and family.       This is a list of the screening recommended for you and due dates:  Health Maintenance  Topic Date Due  . Pneumonia vaccines (2 of 2 - PCV13) 10/24/2010  . Flu Shot  04/01/2018  . Tetanus Vaccine  02/10/2023  . DEXA scan (bone density measurement)  Completed   It is important to avoid accidents which may result in broken bones.  Here are a few ideas on how to make your home safer so you will be less likely to trip or fall.  1. Use nonskid mats or non slip strips in your shower or tub, on your bathroom floor and around sinks.  If you know that you have spilled water, wipe it up! 2. In the bathroom, it is important to have properly installed grab bars on the walls or on the edge of the tub.  Towel racks are NOT strong enough for you to hold onto or to pull on for support. 3. Stairs and hallways should have enough light.  Add lamps or night lights if you need ore light. 4. It is good to have handrails on both sides of the stairs if possible.  Always fix broken handrails right away. 5. It is important to see the edges of steps.  Paint the edges of outdoor steps white so you can see them better.  Put colored tape on the edge of inside steps. 6. Throw-rugs are dangerous  because they can slide.  Removing the rugs is the best idea, but if they must stay, add adhesive carpet tape to prevent slipping. 7. Do not keep things on stairs or in the halls.  Remove small furniture that blocks the halls as it may cause you to trip.  Keep telephone and electrical cords out of the way where you walk. 8. Always were sturdy, rubber-soled shoes for good support.  Never wear just socks, especially on the stairs.  Socks may cause you to slip or fall.  Do not wear full-length housecoats as you can easily trip on the bottom.  9. Place the things you use the most on the shelves that are the easiest to reach.  If you use a stepstool, make sure it is in good condition.  If you feel unsteady, DO NOT climb, ask for help. 10. If a health professional advises you to use a cane or walker, do not be ashamed.  These items can keep you from falling and breaking your bones.  Health Maintenance, Female Adopting a healthy lifestyle and getting preventive care can go a long way to promote health and wellness. Talk with your health care provider about what schedule of regular examinations is right for you. This is a good chance for you to check in with your provider about  disease prevention and staying healthy. In between checkups, there are plenty of things you can do on your own. Experts have done a lot of research about which lifestyle changes and preventive measures are most likely to keep you healthy. Ask your health care provider for more information. Weight and diet Eat a healthy diet  Be sure to include plenty of vegetables, fruits, low-fat dairy products, and lean protein.  Do not eat a lot of foods high in solid fats, added sugars, or salt.  Get regular exercise. This is one of the most important things you can do for your health. ? Most adults should exercise for at least 150 minutes each week. The exercise should increase your heart rate and make you sweat (moderate-intensity  exercise). ? Most adults should also do strengthening exercises at least twice a week. This is in addition to the moderate-intensity exercise.  Maintain a healthy weight  Body mass index (BMI) is a measurement that can be used to identify possible weight problems. It estimates body fat based on height and weight. Your health care provider can help determine your BMI and help you achieve or maintain a healthy weight.  For females 83 years of age and older: ? A BMI below 18.5 is considered underweight. ? A BMI of 18.5 to 24.9 is normal. ? A BMI of 25 to 29.9 is considered overweight. ? A BMI of 30 and above is considered obese.  Watch levels of cholesterol and blood lipids  You should start having your blood tested for lipids and cholesterol at 82 years of age, then have this test every 5 years.  You may need to have your cholesterol levels checked more often if: ? Your lipid or cholesterol levels are high. ? You are older than 82 years of age. ? You are at high risk for heart disease.  Cancer screening Lung Cancer  Lung cancer screening is recommended for adults 46-65 years old who are at high risk for lung cancer because of a history of smoking.  A yearly low-dose CT scan of the lungs is recommended for people who: ? Currently smoke. ? Have quit within the past 15 years. ? Have at least a 30-pack-year history of smoking. A pack year is smoking an average of one pack of cigarettes a day for 1 year.  Yearly screening should continue until it has been 15 years since you quit.  Yearly screening should stop if you develop a health problem that would prevent you from having lung cancer treatment.  Breast Cancer  Practice breast self-awareness. This means understanding how your breasts normally appear and feel.  It also means doing regular breast self-exams. Let your health care provider know about any changes, no matter how small.  If you are in your 20s or 30s, you should have a  clinical breast exam (CBE) by a health care provider every 1-3 years as part of a regular health exam.  If you are 49 or older, have a CBE every year. Also consider having a breast X-ray (mammogram) every year.  If you have a family history of breast cancer, talk to your health care provider about genetic screening.  If you are at high risk for breast cancer, talk to your health care provider about having an MRI and a mammogram every year.  Breast cancer gene (BRCA) assessment is recommended for women who have family members with BRCA-related cancers. BRCA-related cancers include: ? Breast. ? Ovarian. ? Tubal. ? Peritoneal cancers.  Results of  the assessment will determine the need for genetic counseling and BRCA1 and BRCA2 testing.  Cervical Cancer Your health care provider may recommend that you be screened regularly for cancer of the pelvic organs (ovaries, uterus, and vagina). This screening involves a pelvic examination, including checking for microscopic changes to the surface of your cervix (Pap test). You may be encouraged to have this screening done every 3 years, beginning at age 59.  For women ages 9-65, health care providers may recommend pelvic exams and Pap testing every 3 years, or they may recommend the Pap and pelvic exam, combined with testing for human papilloma virus (HPV), every 5 years. Some types of HPV increase your risk of cervical cancer. Testing for HPV may also be done on women of any age with unclear Pap test results.  Other health care providers may not recommend any screening for nonpregnant women who are considered low risk for pelvic cancer and who do not have symptoms. Ask your health care provider if a screening pelvic exam is right for you.  If you have had past treatment for cervical cancer or a condition that could lead to cancer, you need Pap tests and screening for cancer for at least 20 years after your treatment. If Pap tests have been discontinued,  your risk factors (such as having a new sexual partner) need to be reassessed to determine if screening should resume. Some women have medical problems that increase the chance of getting cervical cancer. In these cases, your health care provider may recommend more frequent screening and Pap tests.  Colorectal Cancer  This type of cancer can be detected and often prevented.  Routine colorectal cancer screening usually begins at 82 years of age and continues through 82 years of age.  Your health care provider may recommend screening at an earlier age if you have risk factors for colon cancer.  Your health care provider may also recommend using home test kits to check for hidden blood in the stool.  A small camera at the end of a tube can be used to examine your colon directly (sigmoidoscopy or colonoscopy). This is done to check for the earliest forms of colorectal cancer.  Routine screening usually begins at age 65.  Direct examination of the colon should be repeated every 5-10 years through 82 years of age. However, you may need to be screened more often if early forms of precancerous polyps or small growths are found.  Skin Cancer  Check your skin from head to toe regularly.  Tell your health care provider about any new moles or changes in moles, especially if there is a change in a mole's shape or color.  Also tell your health care provider if you have a mole that is larger than the size of a pencil eraser.  Always use sunscreen. Apply sunscreen liberally and repeatedly throughout the day.  Protect yourself by wearing long sleeves, pants, a wide-brimmed hat, and sunglasses whenever you are outside.  Heart disease, diabetes, and high blood pressure  High blood pressure causes heart disease and increases the risk of stroke. High blood pressure is more likely to develop in: ? People who have blood pressure in the high end of the normal range (130-139/85-89 mm Hg). ? People who are  overweight or obese. ? People who are African American.  If you are 13-50 years of age, have your blood pressure checked every 3-5 years. If you are 57 years of age or older, have your blood pressure checked every  year. You should have your blood pressure measured twice-once when you are at a hospital or clinic, and once when you are not at a hospital or clinic. Record the average of the two measurements. To check your blood pressure when you are not at a hospital or clinic, you can use: ? An automated blood pressure machine at a pharmacy. ? A home blood pressure monitor.  If you are between 74 years and 67 years old, ask your health care provider if you should take aspirin to prevent strokes.  Have regular diabetes screenings. This involves taking a blood sample to check your fasting blood sugar level. ? If you are at a normal weight and have a low risk for diabetes, have this test once every three years after 82 years of age. ? If you are overweight and have a high risk for diabetes, consider being tested at a younger age or more often. Preventing infection Hepatitis B  If you have a higher risk for hepatitis B, you should be screened for this virus. You are considered at high risk for hepatitis B if: ? You were born in a country where hepatitis B is common. Ask your health care provider which countries are considered high risk. ? Your parents were born in a high-risk country, and you have not been immunized against hepatitis B (hepatitis B vaccine). ? You have HIV or AIDS. ? You use needles to inject street drugs. ? You live with someone who has hepatitis B. ? You have had sex with someone who has hepatitis B. ? You get hemodialysis treatment. ? You take certain medicines for conditions, including cancer, organ transplantation, and autoimmune conditions.  Hepatitis C  Blood testing is recommended for: ? Everyone born from [birth year] through 1963. ? Anyone with known risk factors for  hepatitis C.  Sexually transmitted infections (STIs)  You should be screened for sexually transmitted infections (STIs) including gonorrhea and chlamydia if: ? You are sexually active and are younger than 82 years of age. ? You are older than 82 years of age and your health care provider tells you that you are at risk for this type of infection. ? Your sexual activity has changed since you were last screened and you are at an increased risk for chlamydia or gonorrhea. Ask your health care provider if you are at risk.  If you do not have HIV, but are at risk, it may be recommended that you take a prescription medicine daily to prevent HIV infection. This is called pre-exposure prophylaxis (PrEP). You are considered at risk if: ? You are sexually active and do not regularly use condoms or know the HIV status of your partner(s). ? You take drugs by injection. ? You are sexually active with a partner who has HIV.  Talk with your health care provider about whether you are at high risk of being infected with HIV. If you choose to begin PrEP, you should first be tested for HIV. You should then be tested every 3 months for as long as you are taking PrEP. Pregnancy  If you are premenopausal and you may become pregnant, ask your health care provider about preconception counseling.  If you may become pregnant, take 400 to 800 micrograms (mcg) of folic acid every day.  If you want to prevent pregnancy, talk to your health care provider about birth control (contraception). Osteoporosis and menopause  Osteoporosis is a disease in which the bones lose minerals and strength with aging. This can result in  serious bone fractures. Your risk for osteoporosis can be identified using a bone density scan.  If you are 82 years of age or older, or if you are at risk for osteoporosis and fractures, ask your health care provider if you should be screened.  Ask your health care provider whether you should take a  calcium or vitamin D supplement to lower your risk for osteoporosis.  Menopause may have certain physical symptoms and risks.  Hormone replacement therapy may reduce some of these symptoms and risks. Talk to your health care provider about whether hormone replacement therapy is right for you. Follow these instructions at home:  Schedule regular health, dental, and eye exams.  Stay current with your immunizations.  Do not use any tobacco products including cigarettes, chewing tobacco, or electronic cigarettes.  If you are pregnant, do not drink alcohol.  If you are breastfeeding, limit how much and how often you drink alcohol.  Limit alcohol intake to no more than 1 drink per day for nonpregnant women. One drink equals 12 ounces of beer, 5 ounces of Agnes Probert, or 1 ounces of hard liquor.  Do not use street drugs.  Do not share needles.  Ask your health care provider for help if you need support or information about quitting drugs.  Tell your health care provider if you often feel depressed.  Tell your health care provider if you have ever been abused or do not feel safe at home. This information is not intended to replace advice given to you by your health care provider. Make sure you discuss any questions you have with your health care provider. Document Released: 03/03/2011 Document Revised: 01/24/2016 Document Reviewed: 05/22/2015 Elsevier Interactive Patient Education  [employer].

## 2018-01-26 ENCOUNTER — Other Ambulatory Visit: Payer: Self-pay | Admitting: Internal Medicine

## 2018-02-17 ENCOUNTER — Encounter (INDEPENDENT_AMBULATORY_CARE_PROVIDER_SITE_OTHER): Payer: Self-pay | Admitting: Family

## 2018-02-17 ENCOUNTER — Ambulatory Visit (INDEPENDENT_AMBULATORY_CARE_PROVIDER_SITE_OTHER): Payer: Medicare Other

## 2018-02-17 ENCOUNTER — Ambulatory Visit (INDEPENDENT_AMBULATORY_CARE_PROVIDER_SITE_OTHER): Payer: Medicare Other | Admitting: Family

## 2018-02-17 VITALS — Ht 66.0 in | Wt 215.0 lb

## 2018-02-17 DIAGNOSIS — M5416 Radiculopathy, lumbar region: Secondary | ICD-10-CM

## 2018-02-17 DIAGNOSIS — M25552 Pain in left hip: Secondary | ICD-10-CM | POA: Diagnosis not present

## 2018-02-17 DIAGNOSIS — M25572 Pain in left ankle and joints of left foot: Secondary | ICD-10-CM | POA: Diagnosis not present

## 2018-02-17 MED ORDER — PREDNISONE 10 MG PO TABS: 10.0000 mg | ORAL_TABLET | Freq: Every day | ORAL | 0 refills | Status: DC

## 2018-02-17 MED ORDER — TRAMADOL HCL 50 MG PO TABS: 50.0000 mg | ORAL_TABLET | Freq: Four times a day (QID) | ORAL | 0 refills | Status: DC | PRN

## 2018-02-17 NOTE — Progress Notes (Signed)
Office Visit Note   Patient: [name] [name]           Date of Birth: April 14, 1933           MRN: [id number] Visit Date: 02/17/2018              Requested by: Cassandria Anger, MD [address], [place] [address] PCP: Cassandria Anger, MD  Chief Complaint  Patient presents with  . Left Leg - Pain      HPI: The patient is an 84 year old woman seen today for complaint of left hip, buttocks and lateral leg pain for last few weeks. No injury she can recall.  No weakness. No history of back trouble. Points to lateral ankle as most painful area. No fall or ankle twisting event. Describes pain as tingling and burning.   Assessment & Plan: Visit Diagnoses:  1. Pain in left hip   2. Pain in left ankle and joints of left foot     Plan: Tramadol for breakthrough pain. Will call in prednisone as well for lumbar radiculopathy. Follow up in office in 3-4 weeks if no better.   Follow-Up Instructions: No follow-ups on file.   Right Ankle Exam  Swelling: mild   Left Ankle Exam  Swelling: mild  Range of Motion  The patient has normal left ankle ROM.   Muscle Strength  The patient has normal left ankle strength.  Tests  Anterior drawer: negative  Other  Pulse: present   Back Exam   Muscle Strength  The patient has normal back strength.  Tests  Straight leg raise right: negative Straight leg raise left: positive      Patient is alert, oriented, no adenopathy, well-dressed, normal affect, normal respiratory effort.   Imaging: No results found. No images are attached to the encounter.  Labs: Lab Results  Component Value Date   HGBA1C 5.6 12/29/2014   ESRSEDRATE 22 10/04/2015   ESRSEDRATE 23 (H) 12/29/2014   ESRSEDRATE 24 (H) 04/16/2011   LABURIC 9.3 (H) 12/30/2006   LABORGA ESCHERICHIA COLI 09/24/2015     Lab Results  Component Value Date   ALBUMIN 4.1 07/17/2016   ALBUMIN 3.4 (L) 11/05/2015   ALBUMIN 4.1 10/04/2015   LABURIC 9.3 (H)  12/30/2006    Body mass index is 34.7 kg/m.  Orders:  Orders Placed This Encounter  Procedures  . XR Lumbar Spine 2-3 Views   No orders of the defined types were placed in this encounter.    Procedures: No procedures performed  Clinical Data: No additional findings.  ROS:  All other systems negative, except as noted in the HPI. Review of Systems  Constitutional: Negative for chills and fever.  Musculoskeletal: Positive for arthralgias, back pain and myalgias.  Neurological: Positive for numbness.    Objective: Vital Signs: Ht 5\' 6"  (1.676 m)   Wt 215 lb (97.5 kg)   BMI 34.70 kg/m   Specialty Comments:  No specialty comments available.  PMFS History: Patient Active Problem List   Diagnosis Date Noted  . Acute pharyngitis 07/13/2017  . Actinic keratosis 07/13/2017  . Wart viral 07/01/2017  . Edema 12/19/2016  . Right leg pain 11/03/2016  . Knee pain, left 07/17/2016  . Bell palsy 10/22/2015  . Taste sense altered 10/22/2015  . Mouth droop due to facial weakness 10/12/2015  . Sialoadenitis of submandibular gland 10/04/2015  . Fatigue 10/04/2015  . Groin pain 05/25/2015  . Cold intolerance 04/23/2015  . Headache 12/29/2014  .  Total knee replacement status 05/24/2014  . Lymphedema of upper extremity following lymphadenectomy 05/22/2014  . Preop exam for internal medicine 05/22/2014  . Breast cancer ([facility]) 03/17/2014  . Chronic cholecystitis 11/15/2013  . Abnormal gallbladder x-ray 11/08/2013  . Abdominal tenderness of left lower quadrant 11/02/2013  . Diverticulitis of colon without hemorrhage 11/02/2013  . Hematuria 11/02/2013  . Hypokalemia 11/02/2013  . Obstructive chronic bronchitis with exacerbation ([facility]) 09/05/2013  . Fever, unspecified 08/26/2013  . Dyslipidemia 04/26/2013  . Right elbow pain 02/12/2013  . Mass of right forearm 02/07/2013  . Well adult exam 02/04/2013  . Lipoma of forearm 02/04/2013  . Hematochezia 10/19/2012  . Mass of left  side of neck 01/06/2012  . Diverticulitis 10/10/2011  . Nausea & vomiting 04/16/2011  . Chills 04/16/2011  . LLQ abdominal pain 04/16/2011  . Abdominal pain, epigastric 08/14/2010  . GERD 06/27/2010  . SKIN CANCER, HX OF 01/31/2010  . SINUSITIS, ACUTE 09/03/2009  . CHILLS WITHOUT FEVER 05/04/2009  . Rash and nonspecific skin eruption 11/21/2008  . HERPES ZOSTER 10/04/2008  . PARESTHESIA 10/04/2008  . GRIEF REACTION 08/15/2008  . Depression 08/15/2008  . Osteoarthritis 08/15/2008  . HYPOKALEMIA 12/22/2007  . WEIGHT LOSS, ABNORMAL 10/21/2007  . Pain in Soft Tissues of Limb 06/21/2007  . B12 deficiency 03/27/2007  . Vitamin D deficiency 03/27/2007  . HYPERLIPIDEMIA 03/27/2007  . ANEMIA-IRON DEFICIENCY 03/27/2007  . INSOMNIA, PERSISTENT 03/27/2007  . Essential hypertension 03/27/2007  . BRONCHITIS, ACUTE 03/27/2007  . DIVERTICULOSIS, COLON 03/27/2007   Past Medical History:  Diagnosis Date  . Acute lymphadenitis of arm   . Anemia    iron deficiency  . Cancer (HCC)    breast, skin  . Depression 2009   grief  . Diverticulosis of colon 2004   Dr Deatra Ina  . GERD (gastroesophageal reflux disease)   . Heart murmur   . Hematuria    Dr Terance Hart  . Hyperlipidemia   . Hypertension   . Osteoarthritis, knee   . Vitamin B12 deficiency   . Vitamin D deficiency     Family History  Problem Relation Age of Onset  . Cancer Sister        breast  . Cancer Maternal Aunt        breast  . Coronary artery disease Other   . Cancer Other        aunt, niece    Past Surgical History:  Procedure Laterality Date  . ABDOMINAL HYSTERECTOMY    . JOINT REPLACEMENT    . L groin skin cancer  2011  . MASTECTOMY  09-13-08   left and right  . TOTAL KNEE ARTHROPLASTY  06-04-98  . TOTAL KNEE ARTHROPLASTY Right 05/24/2014   Procedure: RIGHT TOTAL KNEE ARTHROPLASTY;  Surgeon: Newt Minion, MD;  Location: [facility];  Service: Orthopedics;  Laterality: Right;   Social History   Occupational History    . Not on file  Tobacco Use  . Smoking status: Never Smoker  . Smokeless tobacco: Never Used  Substance and Sexual Activity  . Alcohol use: No  . Drug use: No  . Sexual activity: Never

## 2018-02-26 ENCOUNTER — Encounter: Payer: Self-pay | Admitting: Internal Medicine

## 2018-02-26 ENCOUNTER — Ambulatory Visit (INDEPENDENT_AMBULATORY_CARE_PROVIDER_SITE_OTHER): Payer: Medicare Other | Admitting: Internal Medicine

## 2018-02-26 ENCOUNTER — Other Ambulatory Visit (INDEPENDENT_AMBULATORY_CARE_PROVIDER_SITE_OTHER): Payer: Medicare Other

## 2018-02-26 DIAGNOSIS — M25552 Pain in left hip: Secondary | ICD-10-CM | POA: Diagnosis not present

## 2018-02-26 DIAGNOSIS — M5386 Other specified dorsopathies, lumbar region: Secondary | ICD-10-CM | POA: Insufficient documentation

## 2018-02-26 DIAGNOSIS — E559 Vitamin D deficiency, unspecified: Secondary | ICD-10-CM

## 2018-02-26 DIAGNOSIS — I10 Essential (primary) hypertension: Secondary | ICD-10-CM | POA: Diagnosis not present

## 2018-02-26 DIAGNOSIS — G8929 Other chronic pain: Secondary | ICD-10-CM | POA: Diagnosis not present

## 2018-02-26 DIAGNOSIS — E538 Deficiency of other specified B group vitamins: Secondary | ICD-10-CM | POA: Diagnosis not present

## 2018-02-26 LAB — URINALYSIS, ROUTINE W REFLEX MICROSCOPIC
BILIRUBIN URINE: NEGATIVE
Ketones, ur: NEGATIVE
LEUKOCYTES UA: NEGATIVE
NITRITE: NEGATIVE
Specific Gravity, Urine: 1.015 (ref 1.000–1.030)
Total Protein, Urine: NEGATIVE
URINE GLUCOSE: NEGATIVE
Urobilinogen, UA: 0.2 (ref 0.0–1.0)
WBC UA: NONE SEEN (ref 0–?)
pH: 6.5 (ref 5.0–8.0)

## 2018-02-26 MED ORDER — METHYLPREDNISOLONE ACETATE 80 MG/ML IJ SUSP: 80.0000 mg | Freq: Once | INTRAMUSCULAR | Status: DC

## 2018-02-26 MED ORDER — METHYLPREDNISOLONE ACETATE 80 MG/ML IJ SUSP
80.0000 mg | Freq: Once | INTRAMUSCULAR | Status: AC
  Administered 2018-02-26: 80 mg via INTRA_ARTICULAR

## 2018-02-26 MED ORDER — HYDROCODONE-ACETAMINOPHEN 5-325 MG PO TABS: 1.0000 | ORAL_TABLET | Freq: Four times a day (QID) | ORAL | 0 refills | Status: DC | PRN

## 2018-02-26 NOTE — Progress Notes (Signed)
Subjective:  Patient ID: [name] [name], female    DOB: October 03, 1932  Age: 84 y.o. MRN: [id number]  CC: No chief complaint on file.   HPI [name] [name] presents for LLE pain - saw Ms Coralyn Pear at Dr Jess Barters office - Dx'd w/sciatica; steroids/Tramadol did not help. Had an X ray.   Pain is 9/10 in the L lat hip area. C/o frequent urination x 1 week F/u HTN  Outpatient Medications Prior to Visit  Medication Sig Dispense Refill  . acetaminophen (TYLENOL) 500 MG tablet Take 1,000 mg by mouth every 6 (six) hours as needed.     Marland Kitchen amLODipine (NORVASC) 10 MG tablet TAKE 1 TABLET BY MOUTH EVERY DAY 90 tablet 2  . [facility] B-12 500 MCG SUBL PLACE 2 TABLETS UNDER THE TONGUE DAILY  11  . diphenhydrAMINE (SOMINEX) 25 MG tablet Take 25 mg by mouth at bedtime as needed for sleep. Reported on 11/05/2015    . exemestane (AROMASIN) 25 MG tablet Take 25 mg by mouth daily after breakfast.    . hydrocortisone 2.5 % ointment Apply topically 2 (two) times daily. 30 g 3  . ibuprofen (ADVIL,MOTRIN) 200 MG tablet Take 1-2 tablets (200-400 mg total) by mouth 3 (three) times daily as needed. 60 tablet 0  . losartan (COZAAR) 100 MG tablet Take 1 tablet (100 mg total) by mouth daily. 90 tablet 2  . Multiple Vitamin (MULTIVITAMIN WITH MINERALS) TABS tablet Take 1 tablet by mouth daily.    . potassium chloride SA (KLOR-CON M20) 20 MEQ tablet Take 1 tablet (20 mEq total) by mouth 2 (two) times daily. 180 tablet 3  . pravastatin (PRAVACHOL) 20 MG tablet Take 1 tablet (20 mg total) by mouth daily. 90 tablet 2  . predniSONE (DELTASONE) 10 MG tablet Take 1 tablet (10 mg total) by mouth daily with breakfast. 5 tablet 0  . Probiotic Product (ALIGN) 4 MG CAPS Take 1 capsule (4 mg total) by mouth daily. 30 capsule 0  . traMADol (ULTRAM) 50 MG tablet Take 1 tablet (50 mg total) by mouth every 6 (six) hours as needed. 30 tablet 0  . valsartan-hydrochlorothiazide (DIOVAN-HCT) 320-12.5 MG tablet Take 1 tablet by mouth daily. 90 tablet 3    No facility-administered medications prior to visit.     ROS: Review of Systems  Constitutional: Negative for activity change, appetite change, chills, fatigue and unexpected weight change.  HENT: Negative for congestion, mouth sores and sinus pressure.   Eyes: Negative for visual disturbance.  Respiratory: Negative for cough and chest tightness.   Gastrointestinal: Negative for abdominal pain and nausea.  Genitourinary: Positive for frequency. Negative for difficulty urinating, dysuria and vaginal pain.  Musculoskeletal: Positive for arthralgias, back pain and gait problem.  Skin: Negative for pallor and rash.  Neurological: Negative for dizziness, tremors, weakness, numbness and headaches.  Psychiatric/Behavioral: Negative for confusion and sleep disturbance.    Objective:  BP 134/82 (BP Location: Right Arm, Patient Position: Sitting, Cuff Size: Large)   Pulse 64   Temp 98.3 F (36.8 C) (Oral)   Ht 5\' 6"  (1.676 m)   Wt 212 lb (96.2 kg)   SpO2 98%   BMI 34.22 kg/m   BP Readings from Last 3 Encounters:  02/26/18 134/82  01/01/18 (!) 148/68  10/14/17 138/82    Wt Readings from Last 3 Encounters:  02/26/18 212 lb (96.2 kg)  02/17/18 215 lb (97.5 kg)  01/01/18 215 lb (97.5 kg)    Physical Exam  Constitutional: She appears well-developed.  No distress.  HENT:  Head: Normocephalic.  Right Ear: External ear normal.  Left Ear: External ear normal.  Nose: Nose normal.  Mouth/Throat: Oropharynx is clear and moist.  Eyes: Pupils are equal, round, and reactive to light. Conjunctivae are normal. Right eye exhibits no discharge. Left eye exhibits no discharge.  Neck: Normal range of motion. Neck supple. No JVD present. No tracheal deviation present. No thyromegaly present.  Cardiovascular: Normal rate, regular rhythm and normal heart sounds.  Pulmonary/Chest: No stridor. No respiratory distress. She has no wheezes.  Abdominal: Soft. Bowel sounds are normal. She exhibits no  distension and no mass. There is no tenderness. There is no rebound and no guarding.  Musculoskeletal: She exhibits tenderness. She exhibits no edema.  Lymphadenopathy:    She has no cervical adenopathy.  Neurological: She displays normal reflexes. No cranial nerve deficit. She exhibits normal muscle tone. Coordination abnormal.  Skin: No rash noted. No erythema.  Psychiatric: She has a normal mood and affect. Her behavior is normal. Judgment and thought content normal.  cane L lat troch area - painful   Procedure Note :     Procedure : Joint Injection,  L  hip   Indication:  Trochanteric bursitis with refractory  chronic pain.   Risks including unsuccessful procedure , bleeding, infection, bruising, skin atrophy, "steroid flare-up" and others were explained to the patient in detail as well as the benefits. Informed consent was obtained and signed.   Tthe patient was placed in a comfortable lateral decubitus position. The point of maximal tenderness was identified. Skin was prepped with Betadine and alcohol. Then, a 5 cc syringe with a 2 inch long 24-gauge needle was used for a bursa injection.. The needle was advanced  Into the bursa. I injected the bursa with 4 mL of 2% lidocaine and 80 mg of Depo-Medrol .  Band-Aid was applied.   Tolerated well. Complications: None. Good pain relief following the procedure.      Lab Results  Component Value Date   WBC 4.4 12/22/2016   HGB 12.8 12/22/2016   HCT 38.3 12/22/2016   PLT 274.0 12/22/2016   GLUCOSE 91 12/22/2016   CHOL 282 (H) 07/17/2016   TRIG 102.0 07/17/2016   HDL 83.40 07/17/2016   LDLCALC 178 (H) 07/17/2016   ALT 9 07/17/2016   AST 15 07/17/2016   NA 140 12/22/2016   K 3.9 12/22/2016   CL 102 12/22/2016   CREATININE 0.94 12/22/2016   BUN 16 12/22/2016   CO2 30 12/22/2016   TSH 1.65 12/22/2016   INR 1.67 (H) 05/27/2014   HGBA1C 5.6 12/29/2014    Dg Bone Density  Result Date: 10/04/2014 CLINICAL DATA:  84 year old  postmenopausal female. EXAM: DUAL X-RAY ABSORPTIOMETRY (DXA) FOR BONE MINERAL DENSITY FINDINGS: LEFT FEMUR NECK Bone Mineral Density (BMD):  0.941 g/cm2 Young Adult T-Score: 0.8 Z-Score:  1.7 LEFT FOREARM (1/3 RADIUS) Bone Mineral Density (BMD):  0.775 Young Adult T Score:  1.3 Z Score:  4.4 ASSESSMENT: Patient's diagnostic category is normal by WHO Criteria. FRACTURE RISK: Not increased COMPARISON: When compared to baseline evaluation 08/20/2007 there has been a statistically significant interval decrease in bone mineral density of the left hip of 3.4%. When compared to prior evaluation 01/06/2011 there has been a statistically significant interval increase in bone mineral density of the left forearm of 4.2% and a statistically significant interval decrease in bone mineral density of the left hip of 3.4%. Effective therapies are available in the form of bisphosphonates, selective estrogen  receptor modulators, biologic agents, and hormone replacement therapy (for women). All patients should ensure an adequate intake of dietary calcium (1200 mg daily) and vitamin D (800 IU daily) unless contraindicated. All treatment decisions require clinical judgment and consideration of individual patient factors, including patient preferences, co-morbidities, previous drug use, risk factors not captured in the FRAX model (e.g., frailty, falls, vitamin D deficiency, increased bone turnover, interval significant decline in bone density) and possible under- or over-estimation of fracture risk by FRAX. The National Osteoporosis Foundation recommends that FDA-approved medical therapies be considered in postmenopausal women and men age 13 or older with a: 1. Hip or vertebral (clinical or morphometric) fracture. 2. T-score of -2.5 or lower at the spine or hip. 3. Ten-year fracture probability by FRAX of 3% or greater for hip fracture or 20% or greater for major osteoporotic fracture. People with diagnosed cases of osteoporosis or at  high risk for fracture should have regular bone mineral density tests. For patients eligible for Medicare, routine testing is allowed once every 2 years. The testing frequency can be increased to one year for patients who have rapidly progressing disease, those who are receiving or discontinuing medical therapy to restore bone mass, or have additional risk factors. [facility]) Criteria: Normal: T-scores from +1.0 to -1.0 Low Bone Mass (Osteopenia): T-scores between -1.0 and -2.5 Osteoporosis: T-scores -2.5 and below Comparison to Reference Population: T-score is the key measure used in the diagnosis of osteoporosis and relative risk determination for fracture. It provides a value for bone mass relative to the mean bone mass of a young adult reference population expressed in terms of standard deviation (SD). Z-score is the age-matched score showing the patient's values compared to a population matched for age, sex, and race. This is also expressed in terms of standard deviation. The patient may have values that compare favorably to the age-matched values and still be at increased risk for fracture. Electronically Signed   By: Lovey Newcomer M.D.   On: 10/04/2014 18:28    Assessment & Plan:   There are no diagnoses linked to this encounter.   No orders of the defined types were placed in this encounter.    Follow-up: No follow-ups on file.  Walker Kehr, MD

## 2018-02-26 NOTE — Assessment & Plan Note (Signed)
LLE pain - - Pt saw Ms Coralyn Pear at Dr [facility] Barters office - Dx'd w/sciatica; steroids/Tramadol did not help.  Norco prn

## 2018-02-26 NOTE — Patient Instructions (Signed)
Postprocedure instructions :    A Band-Aid should be left on for 12 hours. Injection therapy is not a cure itself. It is used in conjunction with other modalities. You can use nonsteroidal anti-inflammatories like ibuprofen , hot and cold compresses. Rest is recommended in the next 24 hours. You need to report immediately  if fever, chills or any signs of infection develop. 

## 2018-02-26 NOTE — Addendum Note (Signed)
Addended by: Karren Cobble on: 02/26/2018 08:26 AM   Modules accepted: Orders

## 2018-02-26 NOTE — Assessment & Plan Note (Signed)
Vit D 

## 2018-02-26 NOTE — Assessment & Plan Note (Signed)
Vit B12 

## 2018-02-26 NOTE — Assessment & Plan Note (Signed)
trochanteric bursitis L Will inject Norco prn

## 2018-02-26 NOTE — Assessment & Plan Note (Signed)
Losartan, Amlodipine

## 2018-02-28 ENCOUNTER — Other Ambulatory Visit: Payer: Self-pay | Admitting: Internal Medicine

## 2018-02-28 DIAGNOSIS — R1013 Epigastric pain: Secondary | ICD-10-CM

## 2018-02-28 DIAGNOSIS — R1032 Left lower quadrant pain: Secondary | ICD-10-CM

## 2018-02-28 DIAGNOSIS — R3129 Other microscopic hematuria: Secondary | ICD-10-CM

## 2018-03-12 ENCOUNTER — Ambulatory Visit
Admission: RE | Admit: 2018-03-12 | Discharge: 2018-03-12 | Disposition: A | Discharge status: Home / Self Care | Payer: Medicare Other | Source: Ambulatory Visit | Attending: Internal Medicine | Admitting: Internal Medicine

## 2018-03-12 DIAGNOSIS — R1013 Epigastric pain: Secondary | ICD-10-CM

## 2018-03-12 DIAGNOSIS — R1032 Left lower quadrant pain: Secondary | ICD-10-CM

## 2018-03-12 DIAGNOSIS — K8020 Calculus of gallbladder without cholecystitis without obstruction: Secondary | ICD-10-CM | POA: Diagnosis not present

## 2018-03-12 DIAGNOSIS — R3129 Other microscopic hematuria: Secondary | ICD-10-CM

## 2018-03-23 ENCOUNTER — Ambulatory Visit (INDEPENDENT_AMBULATORY_CARE_PROVIDER_SITE_OTHER): Payer: Medicare Other | Admitting: Internal Medicine

## 2018-03-23 ENCOUNTER — Encounter: Payer: Self-pay | Admitting: Internal Medicine

## 2018-03-23 DIAGNOSIS — B029 Zoster without complications: Secondary | ICD-10-CM | POA: Diagnosis not present

## 2018-03-23 DIAGNOSIS — E538 Deficiency of other specified B group vitamins: Secondary | ICD-10-CM | POA: Diagnosis not present

## 2018-03-23 DIAGNOSIS — E559 Vitamin D deficiency, unspecified: Secondary | ICD-10-CM | POA: Diagnosis not present

## 2018-03-23 MED ORDER — TRIAMCINOLONE ACETONIDE 0.5 % EX CREA: 1.0000 "application " | TOPICAL_CREAM | Freq: Four times a day (QID) | CUTANEOUS | 1 refills | Status: DC

## 2018-03-23 MED ORDER — GABAPENTIN 100 MG PO CAPS: 100.0000 mg | ORAL_CAPSULE | Freq: Three times a day (TID) | ORAL | 0 refills | Status: DC

## 2018-03-23 MED ORDER — VALACYCLOVIR HCL 1 G PO TABS: 1000.0000 mg | ORAL_TABLET | Freq: Three times a day (TID) | ORAL | 0 refills | Status: DC

## 2018-03-23 NOTE — Assessment & Plan Note (Signed)
On B12 

## 2018-03-23 NOTE — Assessment & Plan Note (Signed)
Vit D 

## 2018-03-23 NOTE — Assessment & Plan Note (Signed)
Zoster-like rash on the L upper chest Valtrex Triamc cream Gabapenin prn

## 2018-03-23 NOTE — Progress Notes (Signed)
Subjective:  Patient ID: [name] [name], female    DOB: Sep 30, 1932  Age: 84 y.o. MRN: [id number]  CC: No chief complaint on file.   HPI SADEEL FIDDLER presents for rash on the L upper chest - itching and burning x 1 week - getting worse. Can't sleep due to burning and itching   Outpatient Medications Prior to Visit  Medication Sig Dispense Refill  . acetaminophen (TYLENOL) 500 MG tablet Take 1,000 mg by mouth every 6 (six) hours as needed.     Marland Kitchen amLODipine (NORVASC) 10 MG tablet TAKE 1 TABLET BY MOUTH EVERY DAY 90 tablet 2  . [facility] B-12 500 MCG SUBL PLACE 2 TABLETS UNDER THE TONGUE DAILY  11  . diphenhydrAMINE (SOMINEX) 25 MG tablet Take 25 mg by mouth at bedtime as needed for sleep. Reported on 11/05/2015    . exemestane (AROMASIN) 25 MG tablet Take 25 mg by mouth daily after breakfast.    . HYDROcodone-acetaminophen (NORCO/VICODIN) 5-325 MG tablet Take 1 tablet by mouth every 6 (six) hours as needed for severe pain. 20 tablet 0  . hydrocortisone 2.5 % ointment Apply topically 2 (two) times daily. 30 g 3  . ibuprofen (ADVIL,MOTRIN) 200 MG tablet Take 1-2 tablets (200-400 mg total) by mouth 3 (three) times daily as needed. 60 tablet 0  . losartan (COZAAR) 100 MG tablet Take 1 tablet (100 mg total) by mouth daily. 90 tablet 2  . Multiple Vitamin (MULTIVITAMIN WITH MINERALS) TABS tablet Take 1 tablet by mouth daily.    . potassium chloride SA (KLOR-CON M20) 20 MEQ tablet Take 1 tablet (20 mEq total) by mouth 2 (two) times daily. 180 tablet 3  . pravastatin (PRAVACHOL) 20 MG tablet Take 1 tablet (20 mg total) by mouth daily. 90 tablet 2  . predniSONE (DELTASONE) 10 MG tablet Take 1 tablet (10 mg total) by mouth daily with breakfast. 5 tablet 0  . Probiotic Product (ALIGN) 4 MG CAPS Take 1 capsule (4 mg total) by mouth daily. 30 capsule 0  . valsartan-hydrochlorothiazide (DIOVAN-HCT) 320-12.5 MG tablet Take 1 tablet by mouth daily. 90 tablet 3   No facility-administered medications prior to  visit.     ROS: Review of Systems  Constitutional: Negative for activity change, appetite change, chills, fatigue and unexpected weight change.  HENT: Negative for congestion, mouth sores and sinus pressure.   Eyes: Negative for visual disturbance.  Respiratory: Negative for cough and chest tightness.   Gastrointestinal: Negative for abdominal pain and nausea.  Genitourinary: Negative for difficulty urinating, frequency and vaginal pain.  Musculoskeletal: Negative for back pain and gait problem.  Skin: Positive for rash. Negative for pallor.  Neurological: Negative for dizziness, tremors, weakness, numbness and headaches.  Psychiatric/Behavioral: Negative for confusion and sleep disturbance.    Objective:  BP 128/78 (BP Location: Right Arm, Patient Position: Sitting, Cuff Size: Large)   Pulse 68   Temp 98.4 F (36.9 C) (Oral)   Ht 5\' 6"  (1.676 m)   Wt 212 lb (96.2 kg)   SpO2 97%   BMI 34.22 kg/m   BP Readings from Last 3 Encounters:  03/23/18 128/78  02/26/18 134/82  01/01/18 (!) 148/68    Wt Readings from Last 3 Encounters:  03/23/18 212 lb (96.2 kg)  02/26/18 212 lb (96.2 kg)  02/17/18 215 lb (97.5 kg)    Physical Exam  Constitutional: She appears well-developed. No distress.  HENT:  Head: Normocephalic.  Right Ear: External ear normal.  Left Ear: External ear normal.  Nose: Nose normal.  Mouth/Throat: Oropharynx is clear and moist.  Eyes: Pupils are equal, round, and reactive to light. Conjunctivae are normal. Right eye exhibits no discharge. Left eye exhibits no discharge.  Neck: Normal range of motion. Neck supple. No JVD present. No tracheal deviation present. No thyromegaly present.  Cardiovascular: Normal rate, regular rhythm and normal heart sounds.  Pulmonary/Chest: No stridor. No respiratory distress. She has no wheezes.  Abdominal: Soft. Bowel sounds are normal. She exhibits no distension and no mass. There is no tenderness. There is no rebound and no  guarding.  Musculoskeletal: She exhibits no edema or tenderness.  Lymphadenopathy:    She has no cervical adenopathy.  Neurological: She displays normal reflexes. No cranial nerve deficit. She exhibits normal muscle tone. Coordination normal.  Skin: No rash noted. No erythema.  Psychiatric: She has a normal mood and affect. Her behavior is normal. Judgment and thought content normal.  zoster-like rash on the L upper chest  Lab Results  Component Value Date   WBC 4.4 12/22/2016   HGB 12.8 12/22/2016   HCT 38.3 12/22/2016   PLT 274.0 12/22/2016   GLUCOSE 91 12/22/2016   CHOL 282 (H) 07/17/2016   TRIG 102.0 07/17/2016   HDL 83.40 07/17/2016   LDLCALC 178 (H) 07/17/2016   ALT 9 07/17/2016   AST 15 07/17/2016   NA 140 12/22/2016   K 3.9 12/22/2016   CL 102 12/22/2016   CREATININE 0.94 12/22/2016   BUN 16 12/22/2016   CO2 30 12/22/2016   TSH 1.65 12/22/2016   INR 1.67 (H) 05/27/2014   HGBA1C 5.6 12/29/2014    US Abdomen Complete  Result Date: 03/12/2018 CLINICAL DATA:  Abdominal pain and microscopic hematuria EXAM: ABDOMEN ULTRASOUND COMPLETE COMPARISON:  None. FINDINGS: Gallbladder: Within the gallbladder, there are multiple echogenic foci which move and shadow consistent with cholelithiasis. There is also sludge in the gallbladder. There is no gallbladder wall thickening or pericholecystic fluid. No sonographic Murphy sign noted by sonographer. Common bile duct: Diameter: 4 mm. No intrahepatic, common hepatic, or common bile duct dilatation. Liver: No focal lesion identified. Within normal limits in parenchymal echogenicity. Portal vein is patent on color Doppler imaging with normal direction of blood flow towards the liver. IVC: No abnormality visualized. Pancreas: No pancreatic mass or inflammatory focus. Spleen: Limited visualization. Visualized portions appear unremarkable. Right Kidney: Length: 9.7 cm. Echogenicity within normal limits. No mass or hydronephrosis visualized. Left  Kidney: Length: 9.9 cm. Echogenicity within normal limits. No mass visualized. There is slight fullness of the left renal collecting system. Abdominal aorta: No aneurysm visualized. There is aortic atherosclerosis. Other findings: No demonstrable ascites. IMPRESSION: 1. Cholelithiasis and sludge in gallbladder. No gallbladder wall thickening or pericholecystic fluid. 2. Limited visualization of spleen due to body habitus. Visualized portions of spleen appear normal. 3. Mild fullness of the left renal collecting system without focus of obstruction evident by ultrasound. 4.  Aortic atherosclerosis. Aortic Atherosclerosis (ICD10-I70.0). Electronically Signed   By: Lowella Grip III M.D.   On: 03/12/2018 12:41    Assessment & Plan:   There are no diagnoses linked to this encounter.   No orders of the defined types were placed in this encounter.    Follow-up: No follow-ups on file.  Walker Kehr, MD

## 2018-03-25 ENCOUNTER — Telehealth: Payer: Self-pay | Admitting: Internal Medicine

## 2018-03-25 MED ORDER — TRIAMCINOLONE ACETONIDE 0.5 % EX CREA: 1.0000 "application " | TOPICAL_CREAM | Freq: Four times a day (QID) | CUTANEOUS | 1 refills | Status: DC

## 2018-03-25 NOTE — Telephone Encounter (Signed)
Copied from [place] [id number]. Topic: Quick Communication - Rx Refill/Question >> Mar 25, 2018  8:13 AM Judyann Munson wrote: Medication:triamcinolone cream (KENALOG) 0.5 %   Has the patient contacted their pharmacy? Yes she is requesting to change pharmacy  Preferred Pharmacy (with phone number or street name): [facility], [place], [place] [address] (Ph) [phone]   Agent: Please be advised that RX refills may take up to 3 business days. We ask that you follow-up with your pharmacy.

## 2018-03-25 NOTE — Telephone Encounter (Signed)
Resent to [place].Marland KitchenJohny [name]

## 2018-04-14 ENCOUNTER — Other Ambulatory Visit: Payer: Self-pay | Admitting: Internal Medicine

## 2018-04-21 ENCOUNTER — Encounter: Payer: Self-pay | Admitting: Internal Medicine

## 2018-04-21 ENCOUNTER — Ambulatory Visit (INDEPENDENT_AMBULATORY_CARE_PROVIDER_SITE_OTHER): Payer: Medicare Other | Admitting: Internal Medicine

## 2018-04-21 ENCOUNTER — Other Ambulatory Visit (INDEPENDENT_AMBULATORY_CARE_PROVIDER_SITE_OTHER): Payer: Medicare Other

## 2018-04-21 VITALS — BP 132/84 | HR 59 | Temp 98.3°F | Ht 66.0 in | Wt 213.0 lb

## 2018-04-21 DIAGNOSIS — Z0000 Encounter for general adult medical examination without abnormal findings: Secondary | ICD-10-CM

## 2018-04-21 DIAGNOSIS — B029 Zoster without complications: Secondary | ICD-10-CM

## 2018-04-21 DIAGNOSIS — G4700 Insomnia, unspecified: Secondary | ICD-10-CM

## 2018-04-21 DIAGNOSIS — E538 Deficiency of other specified B group vitamins: Secondary | ICD-10-CM

## 2018-04-21 DIAGNOSIS — I10 Essential (primary) hypertension: Secondary | ICD-10-CM

## 2018-04-21 LAB — HEPATIC FUNCTION PANEL
ALK PHOS: 62 U/L (ref 39–117)
ALT: 9 U/L (ref 0–35)
AST: 13 U/L (ref 0–37)
Albumin: 4 g/dL (ref 3.5–5.2)
BILIRUBIN DIRECT: 0 mg/dL (ref 0.0–0.3)
TOTAL PROTEIN: 7.5 g/dL (ref 6.0–8.3)
Total Bilirubin: 0.4 mg/dL (ref 0.2–1.2)

## 2018-04-21 LAB — BASIC METABOLIC PANEL
BUN: 12 mg/dL (ref 6–23)
CALCIUM: 9.8 mg/dL (ref 8.4–10.5)
CHLORIDE: 102 meq/L (ref 96–112)
CO2: 30 mEq/L (ref 19–32)
CREATININE: 0.81 mg/dL (ref 0.40–1.20)
GFR: 86.79 mL/min (ref >60.00)
Glucose, Bld: 100 mg/dL — ABNORMAL HIGH (ref 70–99)
Potassium: 3.9 mEq/L (ref 3.5–5.1)
Sodium: 140 mEq/L (ref 135–145)

## 2018-04-21 LAB — CBC WITH DIFFERENTIAL/PLATELET
BASOS ABS: 0.1 10*3/uL (ref 0.0–0.1)
BASOS PCT: 1.1 % (ref 0.0–3.0)
EOS ABS: 0.2 10*3/uL (ref 0.0–0.7)
Eosinophils Relative: 3.2 % (ref 0.0–5.0)
HEMATOCRIT: 37.8 % (ref 36.0–46.0)
HEMOGLOBIN: 12.5 g/dL (ref 12.0–15.0)
LYMPHS PCT: 44.2 % (ref 12.0–46.0)
Lymphs Abs: 2.3 10*3/uL (ref 0.7–4.0)
MCHC: 33 g/dL (ref 30.0–36.0)
MCV: 87.3 fl (ref 78.0–100.0)
MONO ABS: 0.4 10*3/uL (ref 0.1–1.0)
Monocytes Relative: 8.3 % (ref 3.0–12.0)
Neutro Abs: 2.2 10*3/uL (ref 1.4–7.7)
Neutrophils Relative %: 43.2 % (ref 43.0–77.0)
Platelets: 295 10*3/uL (ref 150.0–400.0)
RBC: 4.34 Mil/uL (ref 3.87–5.11)
RDW: 15 % (ref 11.5–15.5)
WBC: 5.1 10*3/uL (ref 4.0–10.5)

## 2018-04-21 LAB — LIPID PANEL
CHOL/HDL RATIO: 3
CHOLESTEROL: 229 mg/dL — AB (ref 0–200)
HDL: 71.8 mg/dL (ref >39.00)
LDL CALC: 142 mg/dL — AB (ref 0–99)
NonHDL: 157.34
Triglycerides: 75 mg/dL (ref 0.0–149.0)
VLDL: 15 mg/dL (ref 0.0–40.0)

## 2018-04-21 LAB — URINALYSIS
Bilirubin Urine: NEGATIVE
Ketones, ur: NEGATIVE
LEUKOCYTES UA: NEGATIVE
NITRITE: NEGATIVE
Specific Gravity, Urine: 1.015 (ref 1.000–1.030)
Total Protein, Urine: NEGATIVE
URINE GLUCOSE: NEGATIVE
UROBILINOGEN UA: 0.2 (ref 0.0–1.0)
pH: 7.5 (ref 5.0–8.0)

## 2018-04-21 LAB — TSH: TSH: 1.78 u[IU]/mL (ref 0.35–4.50)

## 2018-04-21 MED ORDER — POTASSIUM CHLORIDE CRYS ER 20 MEQ PO TBCR: 20.0000 meq | EXTENDED_RELEASE_TABLET | Freq: Two times a day (BID) | ORAL | 3 refills | Status: DC

## 2018-04-21 MED ORDER — AMLODIPINE BESYLATE 10 MG PO TABS: 10.0000 mg | ORAL_TABLET | Freq: Every day | ORAL | 3 refills | Status: DC

## 2018-04-21 MED ORDER — LOSARTAN POTASSIUM 100 MG PO TABS: 100.0000 mg | ORAL_TABLET | Freq: Every day | ORAL | 3 refills | Status: DC

## 2018-04-21 MED ORDER — CVS B-12 500 MCG SL SUBL: 2.0000 | SUBLINGUAL_TABLET | Freq: Every day | SUBLINGUAL | 3 refills | Status: AC

## 2018-04-21 NOTE — Patient Instructions (Signed)
Take Gabapentin 300-400 mg at night - it may help to sleep better

## 2018-04-21 NOTE — Assessment & Plan Note (Signed)
Pain is resolving 

## 2018-04-21 NOTE — Progress Notes (Signed)
Subjective:  Patient ID: [name] [name], female    DOB: 16-Dec-1932  Age: 84 y.o. MRN: [id number]  CC: No chief complaint on file.   HPI [name] [name] presents for body shaking in am's F/u shingles - pain is better F/u HTN C/o insomnia  Outpatient Medications Prior to Visit  Medication Sig Dispense Refill  . acetaminophen (TYLENOL) 500 MG tablet Take 1,000 mg by mouth every 6 (six) hours as needed.     Marland Kitchen amLODipine (NORVASC) 10 MG tablet TAKE 1 TABLET BY MOUTH EVERY DAY 90 tablet 2  . [facility] B-12 500 MCG SUBL PLACE 2 TABLETS UNDER THE TONGUE DAILY  11  . diphenhydrAMINE (SOMINEX) 25 MG tablet Take 25 mg by mouth at bedtime as needed for sleep. Reported on 11/05/2015    . exemestane (AROMASIN) 25 MG tablet Take 25 mg by mouth daily after breakfast.    . gabapentin (NEURONTIN) 100 MG capsule TAKE 1 CAPSULE BY MOUTH THREE TIMES A DAY 270 capsule 1  . HYDROcodone-acetaminophen (NORCO/VICODIN) 5-325 MG tablet Take 1 tablet by mouth every 6 (six) hours as needed for severe pain. 20 tablet 0  . hydrocortisone 2.5 % ointment Apply topically 2 (two) times daily. 30 g 3  . ibuprofen (ADVIL,MOTRIN) 200 MG tablet Take 1-2 tablets (200-400 mg total) by mouth 3 (three) times daily as needed. 60 tablet 0  . losartan (COZAAR) 100 MG tablet Take 1 tablet (100 mg total) by mouth daily. 90 tablet 2  . Multiple Vitamin (MULTIVITAMIN WITH MINERALS) TABS tablet Take 1 tablet by mouth daily.    . potassium chloride SA (KLOR-CON M20) 20 MEQ tablet Take 1 tablet (20 mEq total) by mouth 2 (two) times daily. 180 tablet 3  . pravastatin (PRAVACHOL) 20 MG tablet Take 1 tablet (20 mg total) by mouth daily. 90 tablet 2  . predniSONE (DELTASONE) 10 MG tablet Take 1 tablet (10 mg total) by mouth daily with breakfast. 5 tablet 0  . Probiotic Product (ALIGN) 4 MG CAPS Take 1 capsule (4 mg total) by mouth daily. 30 capsule 0  . triamcinolone cream (KENALOG) 0.5 % Apply 1 application topically 4 (four) times daily. On rash  60 g 1  . valACYclovir (VALTREX) 1000 MG tablet Take 1 tablet (1,000 mg total) by mouth 3 (three) times daily. 21 tablet 0  . valsartan-hydrochlorothiazide (DIOVAN-HCT) 320-12.5 MG tablet Take 1 tablet by mouth daily. 90 tablet 3   No facility-administered medications prior to visit.     ROS: Review of Systems  Constitutional: Positive for fatigue. Negative for activity change, appetite change, chills and unexpected weight change.  HENT: Negative for congestion, mouth sores and sinus pressure.   Eyes: Negative for visual disturbance.  Respiratory: Negative for cough and chest tightness.   Gastrointestinal: Negative for abdominal pain and nausea.  Genitourinary: Negative for difficulty urinating, frequency and vaginal pain.  Musculoskeletal: Positive for arthralgias, back pain and gait problem.  Skin: Negative for pallor and rash.  Neurological: Positive for tremors. Negative for dizziness, weakness, numbness and headaches.  Psychiatric/Behavioral: Positive for sleep disturbance. Negative for confusion and suicidal ideas. The patient is not nervous/anxious.     Objective:  BP 132/84 (BP Location: Right Arm, Patient Position: Sitting, Cuff Size: Large)   Pulse (!) 59   Temp 98.3 F (36.8 C) (Oral)   Ht 5\' 6"  (1.676 m)   Wt 213 lb (96.6 kg)   SpO2 97%   BMI 34.38 kg/m   BP Readings from Last 3 Encounters:  04/21/18 132/84  03/23/18 128/78  02/26/18 134/82    Wt Readings from Last 3 Encounters:  04/21/18 213 lb (96.6 kg)  03/23/18 212 lb (96.2 kg)  02/26/18 212 lb (96.2 kg)    Physical Exam  Constitutional: She appears well-developed. No distress.  HENT:  Head: Normocephalic.  Right Ear: External ear normal.  Left Ear: External ear normal.  Nose: Nose normal.  Mouth/Throat: Oropharynx is clear and moist.  Eyes: Pupils are equal, round, and reactive to light. Conjunctivae are normal. Right eye exhibits no discharge. Left eye exhibits no discharge.  Neck: Normal range  of motion. Neck supple. No JVD present. No tracheal deviation present. No thyromegaly present.  Cardiovascular: Normal rate, regular rhythm and normal heart sounds.  Pulmonary/Chest: No stridor. No respiratory distress. She has no wheezes.  Abdominal: Soft. Bowel sounds are normal. She exhibits no distension and no mass. There is no tenderness. There is no rebound and no guarding.  Musculoskeletal: She exhibits tenderness. She exhibits no edema.  Lymphadenopathy:    She has no cervical adenopathy.  Neurological: She displays normal reflexes. No cranial nerve deficit. She exhibits normal muscle tone. Coordination abnormal.  Skin: No rash noted. No erythema.  Psychiatric: She has a normal mood and affect. Her behavior is normal. Judgment and thought content normal.  LS tender Cane  Lab Results  Component Value Date   WBC 4.4 12/22/2016   HGB 12.8 12/22/2016   HCT 38.3 12/22/2016   PLT 274.0 12/22/2016   GLUCOSE 91 12/22/2016   CHOL 282 (H) 07/17/2016   TRIG 102.0 07/17/2016   HDL 83.40 07/17/2016   LDLCALC 178 (H) 07/17/2016   ALT 9 07/17/2016   AST 15 07/17/2016   NA 140 12/22/2016   K 3.9 12/22/2016   CL 102 12/22/2016   CREATININE 0.94 12/22/2016   BUN 16 12/22/2016   CO2 30 12/22/2016   TSH 1.65 12/22/2016   INR 1.67 (H) 05/27/2014   HGBA1C 5.6 12/29/2014    US Abdomen Complete  Result Date: 03/12/2018 CLINICAL DATA:  Abdominal pain and microscopic hematuria EXAM: ABDOMEN ULTRASOUND COMPLETE COMPARISON:  None. FINDINGS: Gallbladder: Within the gallbladder, there are multiple echogenic foci which move and shadow consistent with cholelithiasis. There is also sludge in the gallbladder. There is no gallbladder wall thickening or pericholecystic fluid. No sonographic Murphy sign noted by sonographer. Common bile duct: Diameter: 4 mm. No intrahepatic, common hepatic, or common bile duct dilatation. Liver: No focal lesion identified. Within normal limits in parenchymal echogenicity.  Portal vein is patent on color Doppler imaging with normal direction of blood flow towards the liver. IVC: No abnormality visualized. Pancreas: No pancreatic mass or inflammatory focus. Spleen: Limited visualization. Visualized portions appear unremarkable. Right Kidney: Length: 9.7 cm. Echogenicity within normal limits. No mass or hydronephrosis visualized. Left Kidney: Length: 9.9 cm. Echogenicity within normal limits. No mass visualized. There is slight fullness of the left renal collecting system. Abdominal aorta: No aneurysm visualized. There is aortic atherosclerosis. Other findings: No demonstrable ascites. IMPRESSION: 1. Cholelithiasis and sludge in gallbladder. No gallbladder wall thickening or pericholecystic fluid. 2. Limited visualization of spleen due to body habitus. Visualized portions of spleen appear normal. 3. Mild fullness of the left renal collecting system without focus of obstruction evident by ultrasound. 4.  Aortic atherosclerosis. Aortic Atherosclerosis (ICD10-I70.0). Electronically Signed   By: Lowella Grip III M.D.   On: 03/12/2018 12:41    Assessment & Plan:   There are no diagnoses linked to this encounter.  No orders of the defined types were placed in this encounter.    Follow-up: No follow-ups on file.  Walker Kehr, MD

## 2018-04-21 NOTE — Assessment & Plan Note (Signed)
On B12 po 

## 2018-04-21 NOTE — Assessment & Plan Note (Signed)
Losartan, Amlodipine

## 2018-04-21 NOTE — Assessment & Plan Note (Signed)
Take Gabapentin 300-400 mg at night - it may help to sleep better

## 2018-04-23 ENCOUNTER — Telehealth: Payer: Self-pay | Admitting: Internal Medicine

## 2018-04-23 MED ORDER — PRAVASTATIN SODIUM 20 MG PO TABS: 20.0000 mg | ORAL_TABLET | Freq: Every day | ORAL | 3 refills | Status: DC

## 2018-04-23 NOTE — Telephone Encounter (Signed)
Copied from [place] [id number]. Topic: Quick Communication - Rx Refill/Question >> Apr 23, 2018  9:01 AM Burchel, Abbi R wrote: Medication: pravastatin (PRAVACHOL) 20 MG tablet, [facility] B-12 500 MCG SUBL  Preferred Pharmacy:  [facility] [address] [place] [place] [address] Phone: [phone] Fax: [phone]  Pt states these rxs were left off her last refill request.

## 2018-04-23 NOTE — Telephone Encounter (Signed)
Reviewed chart pt is up-to-date sent refills to [facility].../lmb  

## 2018-04-26 ENCOUNTER — Telehealth: Payer: Self-pay | Admitting: Internal Medicine

## 2018-04-26 NOTE — Telephone Encounter (Signed)
noted 

## 2018-04-26 NOTE — Telephone Encounter (Signed)
Pt returned call and lab message given to her with verbal understanding.  No lab result note found.

## 2018-05-28 ENCOUNTER — Ambulatory Visit: Payer: Medicare Other | Admitting: Podiatry

## 2018-07-11 DIAGNOSIS — R062 Wheezing: Secondary | ICD-10-CM | POA: Diagnosis not present

## 2018-07-11 DIAGNOSIS — R05 Cough: Secondary | ICD-10-CM | POA: Diagnosis not present

## 2018-07-19 ENCOUNTER — Encounter: Payer: Self-pay | Admitting: Internal Medicine

## 2018-07-19 ENCOUNTER — Ambulatory Visit (INDEPENDENT_AMBULATORY_CARE_PROVIDER_SITE_OTHER): Payer: Medicare Other | Admitting: Internal Medicine

## 2018-07-19 DIAGNOSIS — J441 Chronic obstructive pulmonary disease with (acute) exacerbation: Secondary | ICD-10-CM | POA: Diagnosis not present

## 2018-07-19 DIAGNOSIS — M1990 Unspecified osteoarthritis, unspecified site: Secondary | ICD-10-CM | POA: Diagnosis not present

## 2018-07-19 DIAGNOSIS — I10 Essential (primary) hypertension: Secondary | ICD-10-CM

## 2018-07-19 DIAGNOSIS — J209 Acute bronchitis, unspecified: Secondary | ICD-10-CM

## 2018-07-19 MED ORDER — METHYLPREDNISOLONE ACETATE 80 MG/ML IJ SUSP
80.0000 mg | Freq: Once | INTRAMUSCULAR | Status: AC
  Administered 2018-07-19: 80 mg via INTRAMUSCULAR

## 2018-07-19 MED ORDER — PROMETHAZINE-CODEINE 6.25-10 MG/5ML PO SYRP: 5.0000 mL | ORAL_SOLUTION | ORAL | 0 refills | Status: DC | PRN

## 2018-07-19 MED ORDER — AMLODIPINE BESYLATE 10 MG PO TABS: 10.0000 mg | ORAL_TABLET | Freq: Every day | ORAL | 3 refills | Status: DC

## 2018-07-19 MED ORDER — PRAVASTATIN SODIUM 20 MG PO TABS: 20.0000 mg | ORAL_TABLET | Freq: Every day | ORAL | 3 refills | Status: DC

## 2018-07-19 NOTE — Patient Instructions (Signed)
You can use over-the-counter  "cold" medicines  such as  "Afrin" nasal spray for nasal congestion as directed. Use " Delsym" or" Robitussin" cough syrup varietis for cough.  You can use plain "Tylenol" or "Advil" for fever, chills and achyness. Use Halls or Ricola cough drops.     Please, make an appointment if you are not better or if you're worse.  

## 2018-07-19 NOTE — Progress Notes (Signed)
Subjective:  Patient ID: [name] [name], female    DOB: 1933-05-22  Age: 84 y.o. MRN: [id number]  CC: No chief complaint on file.   HPI [name] [name] presents for URI/COPD - post UC visit on 07/11/18. The pt has been sick x 1 mo. Worse, lost voice >1 wk ago. On 2 abx. CXR was ok C/o nausea w/steroids C/o Tessalon - not helping  Outpatient Medications Prior to Visit  Medication Sig Dispense Refill  . acetaminophen (TYLENOL) 500 MG tablet Take 1,000 mg by mouth every 6 (six) hours as needed.     Marland Kitchen azithromycin (ZITHROMAX) 250 MG tablet TAKE 2 TABLETS BY MOUTH ON DAY 1 AND THEN TAKE 1 TABLET BY MOUTH ONCE A DAY ON DAY 2 THROUGH DAY 5. TAKE WITH FOOD  0  . benzonatate (TESSALON) 100 MG capsule TAKE 1 TO 2 CAPSULES BY MOUTH EVERY 8 TO 12 HOURS  0  . cefdinir (OMNICEF) 300 MG capsule Take 300 mg by mouth 2 (two) times daily.  0  . [facility] B-12 500 MCG SUBL Place 2 tablets (1,000 mcg total) under the tongue daily. 200 tablet 3  . diphenhydrAMINE (SOMINEX) 25 MG tablet Take 25 mg by mouth at bedtime as needed for sleep. Reported on 11/05/2015    . exemestane (AROMASIN) 25 MG tablet Take 25 mg by mouth daily after breakfast.    . gabapentin (NEURONTIN) 100 MG capsule TAKE 1 CAPSULE BY MOUTH THREE TIMES A DAY 270 capsule 1  . HYDROcodone-acetaminophen (NORCO/VICODIN) 5-325 MG tablet Take 1 tablet by mouth every 6 (six) hours as needed for severe pain. 20 tablet 0  . hydrocortisone 2.5 % ointment Apply topically 2 (two) times daily. 30 g 3  . ibuprofen (ADVIL,MOTRIN) 200 MG tablet Take 1-2 tablets (200-400 mg total) by mouth 3 (three) times daily as needed. 60 tablet 0  . losartan (COZAAR) 100 MG tablet Take 1 tablet (100 mg total) by mouth daily. 90 tablet 3  . Multiple Vitamin (MULTIVITAMIN WITH MINERALS) TABS tablet Take 1 tablet by mouth daily.    . potassium chloride SA (KLOR-CON M20) 20 MEQ tablet Take 1 tablet (20 mEq total) by mouth 2 (two) times daily. 180 tablet 3  . predniSONE (STERAPRED  UNI-PAK 48 TAB) 10 MG (48) TBPK tablet Take 1 tablet by mouth See admin instructions.  0  . PROAIR HFA 108 (90 Base) MCG/ACT inhaler INHALE 2 PUFFS INTO LUNGS EVERY 4 TO 6 HOURS  0  . Probiotic Product (ALIGN) 4 MG CAPS Take 1 capsule (4 mg total) by mouth daily. 30 capsule 0  . triamcinolone cream (KENALOG) 0.5 % Apply 1 application topically 4 (four) times daily. On rash 60 g 1  . valACYclovir (VALTREX) 1000 MG tablet Take 1 tablet (1,000 mg total) by mouth 3 (three) times daily. 21 tablet 0  . valsartan-hydrochlorothiazide (DIOVAN-HCT) 320-12.5 MG tablet Take 1 tablet by mouth daily. 90 tablet 3  . amLODipine (NORVASC) 10 MG tablet Take 1 tablet (10 mg total) by mouth daily. 90 tablet 3  . pravastatin (PRAVACHOL) 20 MG tablet Take 1 tablet (20 mg total) by mouth daily. 90 tablet 3   No facility-administered medications prior to visit.     ROS: Review of Systems  Constitutional: Positive for fatigue. Negative for activity change, appetite change, chills and unexpected weight change.  HENT: Positive for congestion and voice change. Negative for mouth sores and sinus pressure.   Eyes: Negative for visual disturbance.  Respiratory: Positive for cough, chest  tightness and wheezing.   Cardiovascular: Negative for leg swelling.  Gastrointestinal: Negative for abdominal pain and nausea.  Genitourinary: Negative for difficulty urinating, frequency and vaginal pain.  Musculoskeletal: Positive for arthralgias, back pain and gait problem.  Skin: Negative for pallor and rash.  Neurological: Positive for weakness. Negative for dizziness, tremors, numbness and headaches.  Psychiatric/Behavioral: Negative for confusion, sleep disturbance and suicidal ideas.    Objective:  BP (!) 142/74 (BP Location: Right Arm, Patient Position: Sitting, Cuff Size: Large)   Pulse (!) 56   Temp 97.8 F (36.6 C) (Oral)   Ht 5\' 6"  (1.676 m)   Wt 206 lb (93.4 kg)   SpO2 97%   BMI 33.25 kg/m   BP Readings from  Last 3 Encounters:  07/19/18 (!) 142/74  04/21/18 132/84  03/23/18 128/78    Wt Readings from Last 3 Encounters:  07/19/18 206 lb (93.4 kg)  04/21/18 213 lb (96.6 kg)  03/23/18 212 lb (96.2 kg)    Physical Exam  Constitutional: She appears well-developed. No distress.  HENT:  Head: Normocephalic.  Right Ear: External ear normal.  Left Ear: External ear normal.  Nose: Nose normal.  Mouth/Throat: Oropharynx is clear and moist.  Eyes: Pupils are equal, round, and reactive to light. Conjunctivae are normal. Right eye exhibits no discharge. Left eye exhibits no discharge.  Neck: Normal range of motion. Neck supple. No JVD present. No tracheal deviation present. No thyromegaly present.  Cardiovascular: Normal rate, regular rhythm and normal heart sounds.  Pulmonary/Chest: No stridor. No respiratory distress. She has no wheezes.  Abdominal: Soft. Bowel sounds are normal. She exhibits no distension and no mass. There is no tenderness. There is no rebound and no guarding.  Musculoskeletal: She exhibits no edema or tenderness.  Lymphadenopathy:    She has no cervical adenopathy.  Neurological: She displays normal reflexes. No cranial nerve deficit. She exhibits normal muscle tone. Coordination abnormal.  Skin: No rash noted. No erythema.  Psychiatric: She has a normal mood and affect. Her behavior is normal. Judgment and thought content normal.  eryth throat Hoarse Cane  Lab Results  Component Value Date   WBC 5.1 04/21/2018   HGB 12.5 04/21/2018   HCT 37.8 04/21/2018   PLT 295.0 04/21/2018   GLUCOSE 100 (H) 04/21/2018   CHOL 229 (H) 04/21/2018   TRIG 75.0 04/21/2018   HDL 71.80 04/21/2018   LDLCALC 142 (H) 04/21/2018   ALT 9 04/21/2018   AST 13 04/21/2018   NA 140 04/21/2018   K 3.9 04/21/2018   CL 102 04/21/2018   CREATININE 0.81 04/21/2018   BUN 12 04/21/2018   CO2 30 04/21/2018   TSH 1.78 04/21/2018   INR 1.67 (H) 05/27/2014   HGBA1C 5.6 12/29/2014    US Abdomen  Complete  Result Date: 03/12/2018 CLINICAL DATA:  Abdominal pain and microscopic hematuria EXAM: ABDOMEN ULTRASOUND COMPLETE COMPARISON:  None. FINDINGS: Gallbladder: Within the gallbladder, there are multiple echogenic foci which move and shadow consistent with cholelithiasis. There is also sludge in the gallbladder. There is no gallbladder wall thickening or pericholecystic fluid. No sonographic Murphy sign noted by sonographer. Common bile duct: Diameter: 4 mm. No intrahepatic, common hepatic, or common bile duct dilatation. Liver: No focal lesion identified. Within normal limits in parenchymal echogenicity. Portal vein is patent on color Doppler imaging with normal direction of blood flow towards the liver. IVC: No abnormality visualized. Pancreas: No pancreatic mass or inflammatory focus. Spleen: Limited visualization. Visualized portions appear unremarkable. Right Kidney: Length:  9.7 cm. Echogenicity within normal limits. No mass or hydronephrosis visualized. Left Kidney: Length: 9.9 cm. Echogenicity within normal limits. No mass visualized. There is slight fullness of the left renal collecting system. Abdominal aorta: No aneurysm visualized. There is aortic atherosclerosis. Other findings: No demonstrable ascites. IMPRESSION: 1. Cholelithiasis and sludge in gallbladder. No gallbladder wall thickening or pericholecystic fluid. 2. Limited visualization of spleen due to body habitus. Visualized portions of spleen appear normal. 3. Mild fullness of the left renal collecting system without focus of obstruction evident by ultrasound. 4.  Aortic atherosclerosis. Aortic Atherosclerosis (ICD10-I70.0). Electronically Signed   By: Lowella Grip III M.D.   On: 03/12/2018 12:41    Assessment & Plan:   Diagnoses and all orders for this visit:  Essential hypertension  Acute bronchitis, unspecified organism  Obstructive chronic bronchitis with exacerbation (HCC)  Osteoarthritis, unspecified osteoarthritis  type, unspecified site  Other orders -     amLODipine (NORVASC) 10 MG tablet; Take 1 tablet (10 mg total) by mouth daily. -     pravastatin (PRAVACHOL) 20 MG tablet; Take 1 tablet (20 mg total) by mouth daily.     Meds ordered this encounter  Medications  . amLODipine (NORVASC) 10 MG tablet    Sig: Take 1 tablet (10 mg total) by mouth daily.    Dispense:  90 tablet    Refill:  3  . pravastatin (PRAVACHOL) 20 MG tablet    Sig: Take 1 tablet (20 mg total) by mouth daily.    Dispense:  90 tablet    Refill:  3     Follow-up: No follow-ups on file.  Walker Kehr, MD

## 2018-07-19 NOTE — Assessment & Plan Note (Signed)
Dose-pack should help

## 2018-07-19 NOTE — Assessment & Plan Note (Signed)
Losartan, Amlodipine

## 2018-07-19 NOTE — Addendum Note (Signed)
Addended by: Karren Cobble on: 07/19/2018 10:29 AM   Modules accepted: Orders

## 2018-07-19 NOTE — Assessment & Plan Note (Signed)
Zpack, Omnicef Prom cod syr

## 2018-07-19 NOTE — Assessment & Plan Note (Signed)
Depo-MEDROL IM

## 2018-07-28 ENCOUNTER — Other Ambulatory Visit (INDEPENDENT_AMBULATORY_CARE_PROVIDER_SITE_OTHER): Payer: Medicare Other

## 2018-07-28 ENCOUNTER — Ambulatory Visit (INDEPENDENT_AMBULATORY_CARE_PROVIDER_SITE_OTHER): Payer: Medicare Other | Admitting: Internal Medicine

## 2018-07-28 ENCOUNTER — Encounter: Payer: Self-pay | Admitting: Internal Medicine

## 2018-07-28 ENCOUNTER — Ambulatory Visit: Payer: Self-pay | Admitting: *Deleted

## 2018-07-28 VITALS — BP 140/70 | HR 76 | Temp 99.4°F | Ht 66.0 in | Wt 208.5 lb

## 2018-07-28 DIAGNOSIS — R509 Fever, unspecified: Secondary | ICD-10-CM

## 2018-07-28 DIAGNOSIS — R011 Cardiac murmur, unspecified: Secondary | ICD-10-CM

## 2018-07-28 DIAGNOSIS — R531 Weakness: Secondary | ICD-10-CM | POA: Diagnosis not present

## 2018-07-28 DIAGNOSIS — I4891 Unspecified atrial fibrillation: Secondary | ICD-10-CM | POA: Diagnosis not present

## 2018-07-28 DIAGNOSIS — I4821 Permanent atrial fibrillation: Secondary | ICD-10-CM | POA: Insufficient documentation

## 2018-07-28 LAB — URINALYSIS, ROUTINE W REFLEX MICROSCOPIC
BILIRUBIN URINE: NEGATIVE
KETONES UR: NEGATIVE
NITRITE: NEGATIVE
Specific Gravity, Urine: 1.01 (ref 1.000–1.030)
Total Protein, Urine: NEGATIVE
URINE GLUCOSE: NEGATIVE
UROBILINOGEN UA: 1 (ref 0.0–1.0)
pH: 7 (ref 5.0–8.0)

## 2018-07-28 LAB — SEDIMENTATION RATE: Sed Rate: 7 mm/hr (ref 0–30)

## 2018-07-28 LAB — COMPREHENSIVE METABOLIC PANEL
ALK PHOS: 57 U/L (ref 39–117)
ALT: 18 U/L (ref 0–35)
AST: 11 U/L (ref 0–37)
Albumin: 3.8 g/dL (ref 3.5–5.2)
BUN: 15 mg/dL (ref 6–23)
CHLORIDE: 99 meq/L (ref 96–112)
CO2: 34 meq/L — AB (ref 19–32)
Calcium: 9.6 mg/dL (ref 8.4–10.5)
Creatinine, Ser: 0.98 mg/dL (ref 0.40–1.20)
GFR: 69.62 mL/min (ref >60.00)
GLUCOSE: 115 mg/dL — AB (ref 70–99)
Potassium: 3.9 mEq/L (ref 3.5–5.1)
SODIUM: 140 meq/L (ref 135–145)
TOTAL PROTEIN: 6.9 g/dL (ref 6.0–8.3)
Total Bilirubin: 0.4 mg/dL (ref 0.2–1.2)

## 2018-07-28 LAB — CBC WITH DIFFERENTIAL/PLATELET
Basophils Absolute: 0.3 10*3/uL — ABNORMAL HIGH (ref 0.0–0.1)
Basophils Relative: 3.5 % — ABNORMAL HIGH (ref 0.0–3.0)
EOS ABS: 0.2 10*3/uL (ref 0.0–0.7)
EOS PCT: 2 % (ref 0.0–5.0)
HCT: 39.5 % (ref 36.0–46.0)
Hemoglobin: 13.1 g/dL (ref 12.0–15.0)
LYMPHS ABS: 3.7 10*3/uL (ref 0.7–4.0)
Lymphocytes Relative: 41.7 % (ref 12.0–46.0)
MCHC: 33.2 g/dL (ref 30.0–36.0)
MCV: 86.5 fl (ref 78.0–100.0)
MONO ABS: 1 10*3/uL (ref 0.1–1.0)
Monocytes Relative: 11.3 % (ref 3.0–12.0)
NEUTROS PCT: 41.5 % — AB (ref 43.0–77.0)
Neutro Abs: 3.7 10*3/uL (ref 1.4–7.7)
Platelets: 257 10*3/uL (ref 150.0–400.0)
RBC: 4.56 Mil/uL (ref 3.87–5.11)
RDW: 14.7 % (ref 11.5–15.5)
WBC: 8.9 10*3/uL (ref 4.0–10.5)

## 2018-07-28 LAB — TSH: TSH: 1.74 u[IU]/mL (ref 0.35–4.50)

## 2018-07-28 LAB — C-REACTIVE PROTEIN: CRP: 1.4 mg/dL (ref 0.5–20.0)

## 2018-07-28 MED ORDER — RIVAROXABAN 20 MG PO TABS: 20.0000 mg | ORAL_TABLET | Freq: Every day | ORAL | 0 refills | Status: DC

## 2018-07-28 NOTE — Telephone Encounter (Signed)
Pt called with fatigue since the 10 th of Nov. She was diagnosed with bronchitis on that day and received treatment.  She has finished all her medications and just feel run down. Before the appointment she was having breathing problems for about a month and decided to go to a provider.  She denies fever, shortness of breath now, dizziness, cough or n/v. She did have diarrhea yesterday but none today. She is requesting an appointment for today for feeling fatigue. Appointment scheduled. Will notify [facility] at [facility]. Pt advised to call back for any increase in symptoms or concerns. Pt voiced understanding.   Reason for Disposition . [1] MODERATE weakness (i.e., interferes with work, school, normal activities) AND [2] cause unknown  (Exceptions: weakness with acute minor illness, or weakness from poor fluid intake)  Answer Assessment - Initial Assessment Questions 1. DESCRIPTION: "Describe how you are feeling."     Feeling fatigue 2. SEVERITY: "How bad is it?"  "Can you stand and walk?"   - MILD - Feels weak or tired, but does not interfere with work, school or normal activities   - [place] to stand and walk; weakness interferes with work, school, or normal activities   - SEVERE - Unable to stand or walk     moderate 3. ONSET:  "When did the weakness begin?"     On the 10 th of Nov 4. CAUSE: "What do you think is causing the weakness?"     From having bronchitis 5. MEDICINES: "Have you recently started a new medicine or had a change in the amount of a medicine?"     no 6. OTHER SYMPTOMS: "Do you have any other symptoms?" (e.g., chest pain, fever, cough, SOB, vomiting, diarrhea, bleeding, other areas of pain)     Diarrhea yesterday 7. PREGNANCY: "Is there any chance you are pregnant?" "When was your last menstrual period?"     n/a  Protocols used: WEAKNESS (GENERALIZED) AND FATIGUE-A-AH

## 2018-07-28 NOTE — Patient Instructions (Signed)
Atrial Fibrillation Atrial fibrillation is a type of irregular or rapid heartbeat (arrhythmia). In atrial fibrillation, the heart quivers continuously in a chaotic pattern. This occurs when parts of the heart receive disorganized signals that make the heart unable to pump blood normally. This can increase the risk for stroke, heart failure, and other heart-related conditions. There are different types of atrial fibrillation, including:  Paroxysmal atrial fibrillation. This type starts suddenly, and it usually stops on its own shortly after it starts.  Persistent atrial fibrillation. This type often lasts longer than a week. It may stop on its own or with treatment.  Long-lasting persistent atrial fibrillation. This type lasts longer than 12 months.  Permanent atrial fibrillation. This type does not go away.  Talk with your health care provider to learn about the type of atrial fibrillation that you have. What are the causes? This condition is caused by some heart-related conditions or procedures, including:  A heart attack.  Coronary artery disease.  Heart failure.  Heart valve conditions.  High blood pressure.  Inflammation of the sac that surrounds the heart (pericarditis).  Heart surgery.  Certain heart rhythm disorders, such as Wolf-Parkinson-White syndrome.  Other causes include:  Pneumonia.  Obstructive sleep apnea.  Blockage of an artery in the lungs (pulmonary embolism, or PE).  Lung cancer.  Chronic lung disease.  Thyroid problems, especially if the thyroid is overactive (hyperthyroidism).  Caffeine.  Excessive alcohol use or illegal drug use.  Use of some medicines, including certain decongestants and diet pills.  Sometimes, the cause cannot be found. What increases the risk? This condition is more likely to develop in:  People who are older in age.  People who smoke.  People who have diabetes mellitus.  People who are overweight  (obese).  Athletes who exercise vigorously.  What are the signs or symptoms? Symptoms of this condition include:  A feeling that your heart is beating rapidly or irregularly.  A feeling of discomfort or pain in your chest.  Shortness of breath.  Sudden light-headedness or weakness.  Getting tired easily during exercise.  In some cases, there are no symptoms. How is this diagnosed? Your health care provider may be able to detect atrial fibrillation when taking your pulse. If detected, this condition may be diagnosed with:  An electrocardiogram (ECG).  A Holter monitor test that records your heartbeat patterns over a 24-hour period.  Transthoracic echocardiogram (TTE) to evaluate how blood flows through your heart.  Transesophageal echocardiogram (TEE) to view more detailed images of your heart.  A stress test.  Imaging tests, such as a CT scan or chest X-ray.  Blood tests.  How is this treated? The main goals of treatment are to prevent blood clots from forming and to keep your heart beating at a normal rate and rhythm. The type of treatment that you receive depends on many factors, such as your underlying medical conditions and how you feel when you are experiencing atrial fibrillation. This condition may be treated with:  Medicine to slow down the heart rate, bring the heart's rhythm back to normal, or prevent clots from forming.  Electrical cardioversion. This is a procedure that resets your heart's rhythm by delivering a controlled, low-energy shock to the heart through your skin.  Different types of ablation, such as catheter ablation, catheter ablation with pacemaker, or surgical ablation. These procedures destroy the heart tissues that send abnormal signals. When the pacemaker is used, it is placed under your skin to help your heart beat in   a regular rhythm.  Follow these instructions at home:  Take over-the counter and prescription medicines only as told by your  health care provider.  If your health care provider prescribed a blood-thinning medicine (anticoagulant), take it exactly as told. Taking too much blood-thinning medicine can cause bleeding. If you do not take enough blood-thinning medicine, you will not have the protection that you need against stroke and other problems.  Do not use tobacco products, including cigarettes, chewing tobacco, and e-cigarettes. If you need help quitting, ask your health care provider.  If you have obstructive sleep apnea, manage your condition as told by your health care provider.  Do not drink alcohol.  Do not drink beverages that contain caffeine, such as coffee, soda, and tea.  Maintain a healthy weight. Do not use diet pills unless your health care provider approves. Diet pills may make heart problems worse.  Follow diet instructions as told by your health care provider.  Exercise regularly as told by your health care provider.  Keep all follow-up visits as told by your health care provider. This is important. How is this prevented?  Avoid drinking beverages that contain caffeine or alcohol.  Avoid certain medicines, especially medicines that are used for breathing problems.  Avoid certain herbs and herbal medicines, such as those that contain ephedra or ginseng.  Do not use illegal drugs, such as cocaine and amphetamines.  Do not smoke.  Manage your high blood pressure. Contact a health care provider if:  You notice a change in the rate, rhythm, or strength of your heartbeat.  You are taking an anticoagulant and you notice increased bruising.  You tire more easily when you exercise or exert yourself. Get help right away if:  You have chest pain, abdominal pain, sweating, or weakness.  You feel nauseous.  You notice blood in your vomit, bowel movement, or urine.  You have shortness of breath.  You suddenly have swollen feet and ankles.  You feel dizzy.  You have sudden weakness or  numbness of the face, arm, or leg, especially on one side of the body.  You have trouble speaking, trouble understanding, or both (aphasia).  Your face or your eyelid droops on one side. These symptoms may represent a serious problem that is an emergency. Do not wait to see if the symptoms will go away. Get medical help right away. Call your local emergency services (911 in the [place] Do not drive yourself to the hospital. This information is not intended to replace advice given to you by your health care provider. Make sure you discuss any questions you have with your health care provider. Document Released: 08/18/2005 Document Revised: 12/26/2015 Document Reviewed: 12/13/2014 Elsevier Interactive Patient Education  2018 [employer]  

## 2018-07-28 NOTE — Progress Notes (Signed)
Subjective:  Patient ID: [name] [name], female    DOB: 1932/09/30  Age: 84 y.o. MRN: [id number]  CC: Fever  NEW TO ME  HPI [name] [name] presents for f/up - It is difficult to get a history from her.  She indicates that she has been sick for a couple weeks.  She tells me that she was seen a few weeks ago at an urgent care center and was diagnosed with bronchitis.  She tells me an x-ray was normal but I do not have any results from that visit.  For a few weeks now she has had a strange sensation in her chest and she complains of weakness and urinary frequency.  She denies cough, shortness of breath, or palpitations but she has had a low-grade fever.  She tells me she has a history of a murmur.  Outpatient Medications Prior to Visit  Medication Sig Dispense Refill  . acetaminophen (TYLENOL) 500 MG tablet Take 1,000 mg by mouth every 6 (six) hours as needed.     Marland Kitchen amLODipine (NORVASC) 10 MG tablet Take 1 tablet (10 mg total) by mouth daily. 90 tablet 3  . [facility] B-12 500 MCG SUBL Place 2 tablets (1,000 mcg total) under the tongue daily. 200 tablet 3  . diphenhydrAMINE (SOMINEX) 25 MG tablet Take 25 mg by mouth at bedtime as needed for sleep. Reported on 11/05/2015    . exemestane (AROMASIN) 25 MG tablet Take 25 mg by mouth daily after breakfast.    . gabapentin (NEURONTIN) 100 MG capsule TAKE 1 CAPSULE BY MOUTH THREE TIMES A DAY 270 capsule 1  . hydrocortisone 2.5 % ointment Apply topically 2 (two) times daily. 30 g 3  . losartan (COZAAR) 100 MG tablet Take 1 tablet (100 mg total) by mouth daily. 90 tablet 3  . Multiple Vitamin (MULTIVITAMIN WITH MINERALS) TABS tablet Take 1 tablet by mouth daily.    . potassium chloride SA (KLOR-CON M20) 20 MEQ tablet Take 1 tablet (20 mEq total) by mouth 2 (two) times daily. 180 tablet 3  . pravastatin (PRAVACHOL) 20 MG tablet Take 1 tablet (20 mg total) by mouth daily. 90 tablet 3  . PROAIR HFA 108 (90 Base) MCG/ACT inhaler INHALE 2 PUFFS INTO LUNGS EVERY 4  TO 6 HOURS  0  . Probiotic Product (ALIGN) 4 MG CAPS Take 1 capsule (4 mg total) by mouth daily. 30 capsule 0  . promethazine-codeine (PHENERGAN WITH CODEINE) 6.25-10 MG/5ML syrup Take 5 mLs by mouth every 4 (four) hours as needed. 300 mL 0  . triamcinolone cream (KENALOG) 0.5 % Apply 1 application topically 4 (four) times daily. On rash 60 g 1  . valACYclovir (VALTREX) 1000 MG tablet Take 1 tablet (1,000 mg total) by mouth 3 (three) times daily. 21 tablet 0  . valsartan-hydrochlorothiazide (DIOVAN-HCT) 320-12.5 MG tablet Take 1 tablet by mouth daily. 90 tablet 3  . ibuprofen (ADVIL,MOTRIN) 200 MG tablet Take 1-2 tablets (200-400 mg total) by mouth 3 (three) times daily as needed. 60 tablet 0  . azithromycin (ZITHROMAX) 250 MG tablet TAKE 2 TABLETS BY MOUTH ON DAY 1 AND THEN TAKE 1 TABLET BY MOUTH ONCE A DAY ON DAY 2 THROUGH DAY 5. TAKE WITH FOOD  0  . benzonatate (TESSALON) 100 MG capsule TAKE 1 TO 2 CAPSULES BY MOUTH EVERY 8 TO 12 HOURS  0  . cefdinir (OMNICEF) 300 MG capsule Take 300 mg by mouth 2 (two) times daily.  0  . predniSONE (STERAPRED UNI-PAK 48  TAB) 10 MG (48) TBPK tablet Take 1 tablet by mouth See admin instructions.  0   No facility-administered medications prior to visit.     ROS Review of Systems  Constitutional: Positive for fever. Negative for chills, diaphoresis and fatigue.  HENT: Negative.   Eyes: Negative for visual disturbance.  Respiratory: Negative for cough, chest tightness, shortness of breath and wheezing.   Cardiovascular: Negative for chest pain, palpitations and leg swelling.  Gastrointestinal: Negative for abdominal pain, constipation, diarrhea, nausea and vomiting.  Endocrine: Negative.   Genitourinary: Negative.  Negative for difficulty urinating.  Musculoskeletal: Negative.  Negative for myalgias.  Skin: Negative.  Negative for color change and pallor.  Neurological: Positive for weakness. Negative for dizziness, syncope and light-headedness.    Hematological: Negative for adenopathy. Does not bruise/bleed easily.  Psychiatric/Behavioral: Negative.     Objective:  BP 140/70 (BP Location: Left Arm, Patient Position: Sitting, Cuff Size: Large)   Pulse 76   Temp 99.4 F (37.4 C) (Oral)   Ht 5\' 6"  (1.676 m)   Wt 208 lb 8 oz (94.6 kg)   SpO2 95%   BMI 33.65 kg/m   BP Readings from Last 3 Encounters:  07/28/18 140/70  07/19/18 (!) 142/74  04/21/18 132/84    Wt Readings from Last 3 Encounters:  07/28/18 208 lb 8 oz (94.6 kg)  07/19/18 206 lb (93.4 kg)  04/21/18 213 lb (96.6 kg)    Physical Exam  Constitutional: She is oriented to person, place, and time. No distress.  HENT:  Mouth/Throat: Oropharynx is clear and moist. No oropharyngeal exudate.  Eyes: Conjunctivae are normal. No scleral icterus.  Neck: Normal range of motion. Neck supple. No JVD present. No thyromegaly present.  Cardiovascular: Normal rate, S1 normal and S2 normal. An irregularly irregular rhythm present. Exam reveals no gallop, no S4 and no friction rub.  Murmur heard.  Systolic murmur is present with a grade of 2/6.  No diastolic murmur is present. EKG -  Atrial fibrillation  -Nonspecific QRS widening and anterior fascicular block.   -Nonspecific ST depression  -Nondiagnostic.   ABNORMAL -The A. fib is a new finding for her compared to an EKG from several years ago.  2/6 SEM heard best over the right upper sternal border but heard everywhere.  Pulmonary/Chest: Effort normal and breath sounds normal. She has no wheezes. She has no rales.  Abdominal: Soft. Bowel sounds are normal. She exhibits no mass. There is no tenderness.  Musculoskeletal: Normal range of motion. She exhibits no edema, tenderness or deformity.  Lymphadenopathy:    She has no cervical adenopathy.  Neurological: She is alert and oriented to person, place, and time.  Skin: Skin is warm and dry. She is not diaphoretic. No pallor.  Vitals reviewed.   Lab Results  Component  Value Date   WBC 8.9 07/28/2018   HGB 13.1 07/28/2018   HCT 39.5 07/28/2018   PLT 257.0 07/28/2018   GLUCOSE 115 (H) 07/28/2018   CHOL 229 (H) 04/21/2018   TRIG 75.0 04/21/2018   HDL 71.80 04/21/2018   LDLCALC 142 (H) 04/21/2018   ALT 18 07/28/2018   AST 11 07/28/2018   NA 140 07/28/2018   K 3.9 07/28/2018   CL 99 07/28/2018   CREATININE 0.98 07/28/2018   BUN 15 07/28/2018   CO2 34 (H) 07/28/2018   TSH 1.74 07/28/2018   INR 1.67 (H) 05/27/2014   HGBA1C 5.6 12/29/2014    US Abdomen Complete  Result Date: 03/12/2018 CLINICAL DATA:  Abdominal pain and microscopic hematuria EXAM: ABDOMEN ULTRASOUND COMPLETE COMPARISON:  None. FINDINGS: Gallbladder: Within the gallbladder, there are multiple echogenic foci which move and shadow consistent with cholelithiasis. There is also sludge in the gallbladder. There is no gallbladder wall thickening or pericholecystic fluid. No sonographic Murphy sign noted by sonographer. Common bile duct: Diameter: 4 mm. No intrahepatic, common hepatic, or common bile duct dilatation. Liver: No focal lesion identified. Within normal limits in parenchymal echogenicity. Portal vein is patent on color Doppler imaging with normal direction of blood flow towards the liver. IVC: No abnormality visualized. Pancreas: No pancreatic mass or inflammatory focus. Spleen: Limited visualization. Visualized portions appear unremarkable. Right Kidney: Length: 9.7 cm. Echogenicity within normal limits. No mass or hydronephrosis visualized. Left Kidney: Length: 9.9 cm. Echogenicity within normal limits. No mass visualized. There is slight fullness of the left renal collecting system. Abdominal aorta: No aneurysm visualized. There is aortic atherosclerosis. Other findings: No demonstrable ascites. IMPRESSION: 1. Cholelithiasis and sludge in gallbladder. No gallbladder wall thickening or pericholecystic fluid. 2. Limited visualization of spleen due to body habitus. Visualized portions of  spleen appear normal. 3. Mild fullness of the left renal collecting system without focus of obstruction evident by ultrasound. 4.  Aortic atherosclerosis. Aortic Atherosclerosis (ICD10-I70.0). Electronically Signed   By: Lowella Grip III M.D.   On: 03/12/2018 12:41    Assessment & Plan:   Merdith was seen today for fever.  Diagnoses and all orders for this visit:  Fever, unspecified fever cause- She has had a low-grade fever and weakness but no localizing symptoms.  Her CBC, CRP, and sed rate are normal so I do not think she has endocarditis.  Her urinalysis is positive for a few red blood cells.  I will check a chest x-ray and urine culture to see if there is a bacterial infection.  For now this appears to be a viral syndrome. -     CBC with Differential/Platelet; Future -     Urinalysis, Routine w reflex microscopic; Future -     C-reactive protein; Future -     Sedimentation rate; Future -     Cancel: CULTURE, URINE COMPREHENSIVE; Future -     DG Chest 2 View; Future -     CULTURE, URINE COMPREHENSIVE; Future  Murmur, cardiac- There is an old echocardiogram that shows aortic sclerosis and mild mitral regurg.  Testing today does not indicate any concerns for endocarditis. -     C-reactive protein; Future -     Sedimentation rate; Future -     EKG 12-Lead  Weakness generalized- I think the weakness is caused by the new onset atrial fibrillation. -     TSH; Future -     Comprehensive metabolic panel; Future -     CULTURE, URINE COMPREHENSIVE; Future  Atrial fibrillation, new onset ([place])- She has new onset A. fib which is rate controlled.  Will start anticoagulation with Xarelto.  I have asked her to see cardiology to see if there needs to be an attempt to convert her to sinus rhythm. -     Ambulatory referral to Cardiology -     rivaroxaban (XARELTO) 20 MG TABS tablet; Take 1 tablet (20 mg total) by mouth daily with supper.   I have discontinued [place]. Topor's ibuprofen,  azithromycin, benzonatate, cefdinir, and predniSONE. I am also having her start on rivaroxaban. Additionally, I am having her maintain her acetaminophen, multivitamin with minerals, diphenhydrAMINE, hydrocortisone, ALIGN, exemestane, valsartan-hydrochlorothiazide, valACYclovir, triamcinolone cream, gabapentin, [facility]  B-12, losartan, potassium chloride SA, PROAIR HFA, amLODipine, pravastatin, and promethazine-codeine.  Meds ordered this encounter  Medications  . rivaroxaban (XARELTO) 20 MG TABS tablet    Sig: Take 1 tablet (20 mg total) by mouth daily with supper.    Dispense:  90 tablet    Refill:  0     Follow-up: Return in about 3 weeks (around 08/18/2018).  Scarlette Calico, MD

## 2018-07-30 ENCOUNTER — Telehealth: Payer: Self-pay | Admitting: Internal Medicine

## 2018-07-30 ENCOUNTER — Other Ambulatory Visit: Payer: Medicare Other

## 2018-07-30 DIAGNOSIS — I4891 Unspecified atrial fibrillation: Secondary | ICD-10-CM

## 2018-07-30 DIAGNOSIS — R531 Weakness: Secondary | ICD-10-CM

## 2018-07-30 DIAGNOSIS — R509 Fever, unspecified: Secondary | ICD-10-CM

## 2018-07-30 NOTE — Telephone Encounter (Signed)
Copied from [place] [id number]. Topic: Quick Communication - Rx Refill/Question >> Jul 30, 2018  8:11 AM Rayann Heman wrote: Medication: pt called and stated that rivaroxaban (XARELTO) 20 MG TABS tablet [[id number]] is $135 and she can not afford this. Please advise on what she should do.

## 2018-08-02 ENCOUNTER — Telehealth: Payer: Self-pay | Admitting: Internal Medicine

## 2018-08-02 NOTE — Telephone Encounter (Signed)
Can you release the lab results?

## 2018-08-02 NOTE — Telephone Encounter (Signed)
Pt seen Jones on 07/28/18

## 2018-08-02 NOTE — Telephone Encounter (Signed)
Copied from [place] [id number]. Topic: Quick Communication - Lab Results (Clinic Use ONLY) >> Aug 02, 2018 12:22 PM Lennox Solders wrote: Pt would like blood work results.

## 2018-08-02 NOTE — Telephone Encounter (Signed)
Can [facility] help? Thx

## 2018-08-02 NOTE — Telephone Encounter (Signed)
Lab Results  Component Value Date   WBC 8.9 07/28/2018   HGB 13.1 07/28/2018   HCT 39.5 07/28/2018   PLT 257.0 07/28/2018   GLUCOSE 115 (H) 07/28/2018   CHOL 229 (H) 04/21/2018   TRIG 75.0 04/21/2018   HDL 71.80 04/21/2018   LDLCALC 142 (H) 04/21/2018   ALT 18 07/28/2018   AST 11 07/28/2018   NA 140 07/28/2018   K 3.9 07/28/2018   CL 99 07/28/2018   CREATININE 0.98 07/28/2018   BUN 15 07/28/2018   CO2 34 (H) 07/28/2018   TSH 1.74 07/28/2018   INR 1.67 (H) 05/27/2014   HGBA1C 5.6 12/29/2014    Her labs were all okay

## 2018-08-03 NOTE — Telephone Encounter (Signed)
Called [facility] had to leave msg w/coordinator they will RTC.Marland KitchenJohny Chess

## 2018-08-03 NOTE — Telephone Encounter (Signed)
Called pt and informed labs were normal. Pt stated that her symptoms have not improved but not any worse.

## 2018-08-04 ENCOUNTER — Ambulatory Visit: Payer: Medicare Other | Admitting: Cardiology

## 2018-08-04 ENCOUNTER — Encounter: Payer: Self-pay | Admitting: Cardiology

## 2018-08-04 VITALS — BP 118/70 | HR 90 | Ht 66.0 in | Wt 205.0 lb

## 2018-08-04 DIAGNOSIS — E785 Hyperlipidemia, unspecified: Secondary | ICD-10-CM

## 2018-08-04 DIAGNOSIS — I10 Essential (primary) hypertension: Secondary | ICD-10-CM | POA: Diagnosis not present

## 2018-08-04 DIAGNOSIS — Z01818 Encounter for other preprocedural examination: Secondary | ICD-10-CM | POA: Diagnosis not present

## 2018-08-04 DIAGNOSIS — R0789 Other chest pain: Secondary | ICD-10-CM | POA: Diagnosis not present

## 2018-08-04 DIAGNOSIS — R011 Cardiac murmur, unspecified: Secondary | ICD-10-CM | POA: Diagnosis not present

## 2018-08-04 DIAGNOSIS — I4891 Unspecified atrial fibrillation: Secondary | ICD-10-CM

## 2018-08-04 MED ORDER — RIVAROXABAN 20 MG PO TABS: 20.0000 mg | ORAL_TABLET | Freq: Every day | ORAL | 3 refills | Status: DC

## 2018-08-04 MED ORDER — DRONEDARONE HCL 400 MG PO TABS: 400.0000 mg | ORAL_TABLET | Freq: Two times a day (BID) | ORAL | 6 refills | Status: DC

## 2018-08-04 MED ORDER — METOPROLOL TARTRATE 50 MG PO TABS: 50.0000 mg | ORAL_TABLET | Freq: Once | ORAL | 0 refills | Status: DC

## 2018-08-04 NOTE — Progress Notes (Signed)
PCP: Plotnikov, Evie Lacks, MD  Clinic Note: Chief Complaint  Patient presents with  . New Patient (Initial Visit)  . Atrial Fibrillation    New diagnosis    HPI: [name] [name] is a 84 y.o. female with a PMH below who is being seen today for the evaluation of NEW DX AFIB & DOE at the request of Janith Lima, MD & Plotnikov, Evie Lacks, MD  [name] [name] was last seen on November 27 by Dr. Ronnald Ramp As a stated in for Dr. Alain Marion.  She was following up for her recently diagnosed bronchitis.  She has been noticing decreased sensation in her chest over the last weeks along with generalized weakness.  Only had a history of heart murmur.  Rhythm on EKG.  2 out of 6 systolic murmur on exam.  She is therefore cardiology based on A. Fib. -->  She was started on Xarelto for anticoagulation.  Prior echocardiogram revealed aortic sclerosis but no stenosis and mild mitral regurgitation.  There is possible concern for endocarditis.  Recent Hospitalizations:   None  Studies Personally Reviewed - (if available, images/films reviewed: From Epic Chart or Care Everywhere)  None  Interval History: [name] [name] presents here today really noticing the main symptoms that been going on for over the last month or so has been feeling in general tired and weakness ever since her bronchitis started.  She has been having episodic sweats there is a little bit different.  For the most part the coughing part of her bronchitis is improved, but she still feels weak.  Does not really have any fevers or chills anymore.  Has not had any PND orthopnea but has noticed exertional fatigue more so than dyspnea.  No resting discomfort but when she tries to exert herself and gets little short of breath she may feel her discomfort in her chest that she really cannot describe is any more than any abnormal feeling that is associated with feeling short of breath and tired.  Does not describe it as a sharp stabbing pain but more like a  heaviness sensation.   She really does not feel any abnormal heart rates just, that her heart rate has gone fast or faster than usual.  When is fast she feels it and notes that some is unusual.  She is felt a little lightheaded but no syncope or near syncope.  No TIA or amaurosis fugax.   She was just started on DOAC and has not noted any melena, hematochezia, hematuria, or epstaxis. No claudication.  ROS: A comprehensive was performed. Review of Systems  Constitutional: Positive for malaise/fatigue (Weak with low energy over the last month). Negative for fever and weight loss. Chills: Not really chills, just sweats.  HENT: Negative for nosebleeds.   Respiratory: Positive for cough, shortness of breath and wheezing. Negative for sputum production.        Resolving bronchitis/URI symptoms.  Gastrointestinal: Positive for abdominal pain and nausea (Intermittently with her URI symptoms.). Negative for constipation, heartburn and vomiting.  Genitourinary: Negative for dysuria and hematuria.  Musculoskeletal: Positive for myalgias (Not really myalgias, just muscle aches from her bronchitis.).  Neurological: Negative for dizziness, focal weakness and weakness.  Psychiatric/Behavioral: Negative for depression and memory loss. The patient is nervous/anxious.      I have reviewed and (if needed) personally updated the patient's problem list, medications, allergies, past medical and surgical history, social and family history.   Past Medical History:  Diagnosis Date  . Acute lymphadenitis  of arm   . Anemia    iron deficiency  . Cancer (HCC)    breast, skin  . Depression 2009   grief  . Diverticulosis of colon 2004   Dr Deatra Ina  . GERD (gastroesophageal reflux disease)   . Heart murmur   . Hematuria    Dr Terance Hart  . Hyperlipidemia   . Hypertension   . Osteoarthritis, knee   . Vitamin B12 deficiency   . Vitamin D deficiency     Past Surgical History:  Procedure Laterality Date  .  ABDOMINAL HYSTERECTOMY    . JOINT REPLACEMENT    . L groin skin cancer  2011  . MASTECTOMY  09-13-08   left and right  . TOTAL KNEE ARTHROPLASTY  06-04-98  . TOTAL KNEE ARTHROPLASTY Right 05/24/2014   Procedure: RIGHT TOTAL KNEE ARTHROPLASTY;  Surgeon: Newt Minion, MD;  Location: [facility];  Service: Orthopedics;  Laterality: Right;    Current Meds  Medication Sig  . acetaminophen (TYLENOL) 500 MG tablet Take 1,000 mg by mouth every 6 (six) hours as needed.   Marland Kitchen amLODipine (NORVASC) 10 MG tablet Take 1 tablet (10 mg total) by mouth daily.  . [employer] B-12 500 MCG SUBL Place 2 tablets (1,000 mcg total) under the tongue daily.  . diphenhydrAMINE (SOMINEX) 25 MG tablet Take 25 mg by mouth at bedtime as needed for sleep. Reported on 11/05/2015  . hydrocortisone 2.5 % ointment Apply topically 2 (two) times daily.  Marland Kitchen losartan (COZAAR) 100 MG tablet Take 1 tablet (100 mg total) by mouth daily.  . Multiple Vitamin (MULTIVITAMIN WITH MINERALS) TABS tablet Take 1 tablet by mouth daily.  . potassium chloride SA (KLOR-CON M20) 20 MEQ tablet Take 1 tablet (20 mEq total) by mouth 2 (two) times daily.  . pravastatin (PRAVACHOL) 20 MG tablet Take 1 tablet (20 mg total) by mouth daily.  Marland Kitchen PROAIR HFA 108 (90 Base) MCG/ACT inhaler INHALE 2 PUFFS INTO LUNGS EVERY 4 TO 6 HOURS  . rivaroxaban (XARELTO) 20 MG TABS tablet Take 1 tablet (20 mg total) by mouth daily with supper. WILL CALL WHEN NEEDED FOR REFILL  . triamcinolone cream (KENALOG) 0.5 % Apply 1 application topically 4 (four) times daily. On rash  . valACYclovir (VALTREX) 1000 MG tablet Take 1 tablet (1,000 mg total) by mouth 3 (three) times daily.  . [DISCONTINUED] rivaroxaban (XARELTO) 20 MG TABS tablet Take 1 tablet (20 mg total) by mouth daily with supper.    Allergies  Allergen Reactions  . Anastrozole     REACTION: arthralgias  . Aspirin Other (See Comments)    Gi upset   . Ciprofloxacin Itching  . Letrozole     REACTION: leg pain    Social History    Tobacco Use  . Smoking status: Never Smoker  . Smokeless tobacco: Never Used  Substance Use Topics  . Alcohol use: No  . Drug use: No   Social History   Social History Narrative  . Not on file    family history includes Atrial fibrillation in her other; Cancer in her maternal aunt, other, and sister; Coronary artery disease in her other.  Wt Readings from Last 3 Encounters:  08/04/18 205 lb (93 kg)  07/28/18 208 lb 8 oz (94.6 kg)  07/19/18 206 lb (93.4 kg)    PHYSICAL EXAM BP 118/70 (BP Location: Right Arm, Patient Position: Sitting, Cuff Size: Large)   Pulse 90   Ht 5\' 6"  (1.676 m)   Wt 205 lb (93  kg)   BMI 33.09 kg/m  Physical Exam  Constitutional: She is oriented to person, place, and time. She appears well-developed and well-nourished. No distress.  Healthy-appearing.  Appears younger than stated age.  Well-groomed.  HENT:  Head: Normocephalic and atraumatic.  Mouth/Throat: Oropharynx is clear and moist.  Eyes: Pupils are equal, round, and reactive to light. Conjunctivae and EOM are normal. No scleral icterus.  Neck: Normal range of motion. Neck supple. No hepatojugular reflux and no JVD (Just mild cannon A waves.) present. Carotid bruit is not present.  Cardiovascular: Normal rate, S1 normal, S2 normal and normal pulses. An irregularly irregular rhythm present. PMI is not displaced. Exam reveals no gallop.  Murmur (Soft, ill-defined systolic murmur heard in both the aortic and mitral position.) heard. Pulmonary/Chest: Effort normal and breath sounds normal. No respiratory distress. She has no wheezes. She has no rales.  Abdominal: Soft. Bowel sounds are normal. She exhibits no distension. There is no tenderness. There is no rebound.  Musculoskeletal: Normal range of motion. She exhibits no edema.  Neurological: She is alert and oriented to person, place, and time. No cranial nerve deficit.  Skin: Skin is warm and dry. No erythema.  Psychiatric: She has a normal  mood and affect. Her behavior is normal. Judgment and thought content normal.  Vitals reviewed.    Adult ECG Report  Rate: 90 ;  Rhythm: atrial fibrillation and Controlled ventricular rate.  Left anterior fascicular block.  Cannot exclude anterior MI, age undetermined.;   Narrative Interpretation: Borderline EKG   Other studies Reviewed: Additional studies/ records that were reviewed today include:  Recent Labs:   Lab Results  Component Value Date   CREATININE 0.98 07/28/2018   BUN 15 07/28/2018   NA 140 07/28/2018   K 3.9 07/28/2018   CL 99 07/28/2018   CO2 34 (H) 07/28/2018   Lab Results  Component Value Date   CHOL 229 (H) 04/21/2018   HDL 71.80 04/21/2018   LDLCALC 142 (H) 04/21/2018   TRIG 75.0 04/21/2018   CHOLHDL 3 04/21/2018    ASSESSMENT / PLAN: Problem List Items Addressed This Visit    Atrial fibrillation, new onset ([place]) - Primary (Chronic)    Not sure if this is new onset versus just new diagnosis of A. fib.  I think she may have been having some palpitation episodes in the past that she remembers, but this the first time is been diagnosed.   May very well be related to her bronchitis at this time. Symptom really is only fatigue and exertional dyspnea with intermittent chest discomfort.  Plan:  Continue Xarelto for anticoagulation  Initiate Multaq for rhythm/rate control.  Depending upon further evaluation may consider beta-blocker.  Ischemic evaluation with coronary CT angiogram with possible FFR  Structural evaluation with echocardiogram      Relevant Medications   dronedarone (MULTAQ) 400 MG tablet   rivaroxaban (XARELTO) 20 MG TABS tablet   Other Relevant Orders   EKG 12-Lead (Completed)   Basic metabolic panel   CT CORONARY MORPH W/CTA COR W/SCORE W/CA W/CM &/OR WO/CM   CT CORONARY FRACTIONAL FLOW RESERVE DATA PREP   CT CORONARY FRACTIONAL FLOW RESERVE FLUID ANALYSIS   ECHOCARDIOGRAM COMPLETE   ELECTRICAL CARDIOVERSION   CBC   Chest  discomfort    Chest discomfort in the setting of A. fib with dyspnea is concerning for potential atypical anginal equivalent. Ischemic evaluation with possible CT FFR      Relevant Orders   EKG 12-Lead (Completed)  Basic metabolic panel   CT CORONARY MORPH W/CTA COR W/SCORE W/CA W/CM &/OR WO/CM   CT CORONARY FRACTIONAL FLOW RESERVE DATA PREP   CT CORONARY FRACTIONAL FLOW RESERVE FLUID ANALYSIS   ECHOCARDIOGRAM COMPLETE   CBC   Essential hypertension (Chronic)   Relevant Medications   dronedarone (MULTAQ) 400 MG tablet   rivaroxaban (XARELTO) 20 MG TABS tablet   Hyperlipidemia with target LDL less than 100 (Chronic)    Currently not on medications.  Monitored by PCP.  LDL just this August was 142. Clearly, depending upon ischemic evaluation if there is any evidence of CAD will need to be more aggressive. (1 reason for checking coronary CTA versus Myoview is that it will show evidence of coronary artery disease even nonobstructive)      Relevant Medications   dronedarone (MULTAQ) 400 MG tablet   rivaroxaban (XARELTO) 20 MG TABS tablet   Murmur, cardiac    Systolic murmur heard on exam.  Checking 2D echocardiogram.      Relevant Orders   EKG 12-Lead (Completed)   ECHOCARDIOGRAM COMPLETE    Other Visit Diagnoses    Pre-op testing       Relevant Orders   EKG 12-Lead (Completed)   Basic metabolic panel   CBC       I spent a total of 65 minutes with the patient and chart review. >  50% of the time was spent in direct patient consultation.  We discussed the pathophysiology of atrial fibrillation and the concept of stroke prevention with her CHADS2VASC2 SCORE -we discussed anticoagulation and rate versus rhythm control.  Also discussed importance of ischemic evaluation.  This patients CHA2DS2-VASc Score and unadjusted Ischemic Stroke Rate (% per year) is equal to 4.8 % stroke rate/year from a score of 4  Above score calculated as 1 point each if present [CHF, HTN, DM,  Vascular=MI/PAD/Aortic Plaque, Age if 65-74, or Female] Above score calculated as 2 points each if present [Age > 75, or Stroke/TIA/TE]   Current medicines are reviewed at length with the patient today.  (+/- concerns) none The following changes have been made:  Start Multaq 40 mg twice daily.  Patient Instructions  Medication Instructions:   START MULTAQ 400 MG  ONE TABLET TWICE A DAY  CONTINUE ALL MEDICATIONS ---ESPECIALLY XARELTO   SEE INSTRUCTION FOR  CARDIOVERSION, AND CORONARY CTA If you need a refill on your cardiac medications before your next appointment, please call your pharmacy.   Lab work: SEE INSTRUCTION FOR  CARDIOVERSION, AND CORONARY CTA If you have labs (blood work) drawn today and your tests are completely normal, you will receive your results only by: Marland Kitchen MyChart Message (if you have MyChart) OR . A paper copy in the mail If you have any lab test that is abnormal or we need to change your treatment, we will call you to review the results.  Testing/Procedures: SCHEDULE AT [place] has requested that you have an echocardiogram. Echocardiography is a painless test that uses sound waves to create images of your heart. It provides your doctor with information about the size and shape of your heart and how well your heart's chambers and valves are working. This procedure takes approximately one hour. There are no restrictions for this procedure.   SCHEDULE AT [place] Your physician has requested that you have cardiac CTA. Cardiac computed tomography (CT) is a painless test that uses an x-ray machine to take clear, detailed pictures of your  heart. For further information please visit [email or web address]. Please follow instruction sheet as given.  AND  SCHEDULE AT [place] 2020 Your physician has recommended that you have a Cardioversion (DCCV). Electrical Cardioversion uses a jolt of  electricity to your heart either through paddles or wired patches attached to your chest. This is a controlled, usually prescheduled, procedure. Defibrillation is done under light anesthesia in the hospital, and you usually go home the day of the procedure. This is done to get your heart back into a normal rhythm. You are not awake for the procedure. Please see the instruction sheet given to you today.     Follow-Up: At [facility], you and your health needs are our priority.  As part of our continuing mission to provide you with exceptional heart care, we have created designated Provider Care Teams.  These Care Teams include your primary Cardiologist (physician) and Advanced Practice Providers (APPs -  Physician Assistants and Nurse Practitioners) who all work together to provide you with the care you need, when you need it. You will need a follow up appointment in 2 months JAN 2020.  Please call our office 2 months in advance to schedule this appointment.  You may see Glenetta Hew, MD or one of the following Advanced Practice Providers on your designated Care Team:   Rosaria Ferries, PA-C . Jory Sims, DNP, ANP  Any Other Special Instructions Will Be Listed Below (If Applicable).      INSTRUCTIONS FOR  CORONARY CTA    Please arrive at the [facility] main entrance of [facility] at (30-45 minutes prior to test start time)  [facility] [address], [place] [address] [phone]  Proceed to the [facility] Radiology Department (First Floor).  Please follow these instructions carefully (unless otherwise directed):  PLEASE HAVE LABS - BMP  AT LEAST ONE WEEK PRIOR TO TEST  On the Night Before the Test: . Drink plenty of water. . Do not consume any caffeinated/decaffeinated beverages or chocolate 12 hours prior to your test. . Do not take any antihistamines 12 hours prior to your test.    On the Day of the Test: . Drink plenty of water.  Do not drink any water within one hour of the test. . Do not eat any food 4 hours prior to the test. . You may take your regular medications prior to the test. .  - Take 50 mg of lopressor (metoprolol) TWO hour before the test.   After the Test: . Drink plenty of water. . After receiving IV contrast, you may experience a mild flushed feeling. This is normal. . On occasion, you may experience a mild rash up to 24 hours after the test. This is not dangerous. If this occurs, you can take Benadryl 25 mg and increase your fluid intake. . If you experience trouble breathing, this can be serious. If it is severe call 911 IMMEDIATELY. If it is mild, please call our office.      Electrical Cardioversion Electrical cardioversion is the delivery of a jolt of electricity to restore a normal rhythm to the heart. A rhythm that is too fast or is not regular keeps the heart from pumping well. In this procedure, sticky patches or metal paddles are placed on the chest to deliver electricity to the heart from a device. This procedure may be done in an emergency if:  There is low or no blood pressure as a result of the heart  rhythm.  Normal rhythm must be restored as fast as possible to protect the brain and heart from further damage.  It may save a life.  This procedure may also be done for irregular or fast heart rhythms that are not immediately life-threatening. Tell a health care provider about:  Any allergies you have.  All medicines you are taking, including vitamins, herbs, eye drops, creams, and over-the-counter medicines.  Any problems you or family members have had with anesthetic medicines.  Any blood disorders you have.  Any surgeries you have had.  Any medical conditions you have.  Whether you are pregnant or may be pregnant. What are the risks? Generally, this is a safe procedure. However, problems may occur, including:  Allergic reactions to medicines.  A blood clot that  breaks free and travels to other parts of your body.  The possible return of an abnormal heart rhythm within hours or days after the procedure.  Your heart stopping (cardiac arrest). This is rare.  What happens before the procedure? Medicines  Your health care provider may have you start taking: ? Blood-thinning medicines (anticoagulants) so your blood does not clot as easily. ? Medicines may be given to help stabilize your heart rate and rhythm.  Ask your health care provider about changing or stopping your regular medicines. This is especially important if you are taking diabetes medicines or blood thinners. General instructions  Plan to have someone take you home from the hospital or clinic.  If you will be going home right after the procedure, plan to have someone with you for 24 hours.  Follow instructions from your health care provider about eating or drinking restrictions. What happens during the procedure?  To lower your risk of infection: ? Your health care team will wash or sanitize their hands. ? Your skin will be washed with soap.  An IV tube will be inserted into one of your veins.  You will be given a medicine to help you relax (sedative).  Sticky patches (electrodes) or metal paddles may be placed on your chest.  An electrical shock will be delivered. The procedure may vary among health care providers and hospitals. What happens after the procedure?  Your blood pressure, heart rate, breathing rate, and blood oxygen level will be monitored until the medicines you were given have worn off.  Do not drive for 24 hours if you were given a sedative.  Your heart rhythm will be watched to make sure it does not change. This information is not intended to replace advice given to you by your health care provider. Make sure you discuss any questions you have with your health care provider. Document Released: 08/08/2002 Document Revised: 04/16/2016 Document Reviewed:  02/22/2016 Elsevier Interactive Patient Education  2017 [employer].         Studies Ordered:   Orders Placed This Encounter  Procedures  . ELECTRICAL CARDIOVERSION  . CT CORONARY MORPH W/CTA COR W/SCORE W/CA W/CM &/OR WO/CM  . CT CORONARY FRACTIONAL FLOW RESERVE DATA PREP  . CT CORONARY FRACTIONAL FLOW RESERVE FLUID ANALYSIS  . Basic metabolic panel  . CBC  . EKG 12-Lead  . ECHOCARDIOGRAM COMPLETE      Glenetta Hew, M.D., M.S. Interventional Cardiologist   Pager # [phone] Phone # [phone] [address]. [place], [place] [address]   Thank you for choosing Heart[facility] at [facility]!!

## 2018-08-04 NOTE — Patient Instructions (Addendum)
Medication Instructions:   START MULTAQ 400 MG  ONE TABLET TWICE A DAY  CONTINUE ALL MEDICATIONS ---ESPECIALLY XARELTO   SEE INSTRUCTION FOR  CARDIOVERSION, AND CORONARY CTA If you need a refill on your cardiac medications before your next appointment, please call your pharmacy.   Lab work: SEE INSTRUCTION FOR  CARDIOVERSION, AND CORONARY CTA If you have labs (blood work) drawn today and your tests are completely normal, you will receive your results only by: Marland Kitchen MyChart Message (if you have MyChart) OR . A paper copy in the mail If you have any lab test that is abnormal or we need to change your treatment, we will call you to review the results.  Testing/Procedures: SCHEDULE AT [place] has requested that you have an echocardiogram. Echocardiography is a painless test that uses sound waves to create images of your heart. It provides your doctor with information about the size and shape of your heart and how well your heart's chambers and valves are working. This procedure takes approximately one hour. There are no restrictions for this procedure.   SCHEDULE AT [place] Your physician has requested that you have cardiac CTA. Cardiac computed tomography (CT) is a painless test that uses an x-ray machine to take clear, detailed pictures of your heart. For further information please visit [email or web address]tn. Please follow instruction sheet as given.  AND  SCHEDULE AT [place] 2020 Your physician has recommended that you have a Cardioversion (DCCV). Electrical Cardioversion uses a jolt of electricity to your heart either through paddles or wired patches attached to your chest. This is a controlled, usually prescheduled, procedure. Defibrillation is done under light anesthesia in the hospital, and you usually go home the day of the procedure. This is done to get your heart back into a normal rhythm. You are not awake  for the procedure. Please see the instruction sheet given to you today.     Follow-Up: At [facility], you and your health needs are our priority.  As part of our continuing mission to provide you with exceptional heart care, we have created designated Provider Care Teams.  These Care Teams include your primary Cardiologist (physician) and Advanced Practice Providers (APPs -  Physician Assistants and Nurse Practitioners) who all work together to provide you with the care you need, when you need it. You will need a follow up appointment in 2 months JAN 2020.  Please call our office 2 months in advance to schedule this appointment.  You may see Glenetta Hew, MD or one of the following Advanced Practice Providers on your designated Care Team:   Rosaria Ferries, PA-C . Jory Sims, DNP, ANP  Any Other Special Instructions Will Be Listed Below (If Applicable).      INSTRUCTIONS FOR  CORONARY CTA    Please arrive at the [facility] main entrance of [facility] at (30-45 minutes prior to test start time)  [facility] [place], [place] [address] [phone]  Proceed to the [facility] Radiology Department (First Floor).  Please follow these instructions carefully (unless otherwise directed):  PLEASE HAVE LABS - BMP  AT LEAST ONE WEEK PRIOR TO TEST  On the Night Before the Test: . Drink plenty of water. . Do not consume any caffeinated/decaffeinated beverages or chocolate 12 hours prior to your test. . Do not take any antihistamines 12 hours prior to your test.    On the Day  of the Test: . Drink plenty of water. Do not drink any water within one hour of the test. . Do not eat any food 4 hours prior to the test. . You may take your regular medications prior to the test. .  - Take 50 mg of lopressor (metoprolol) TWO hour before the test.   After the Test: . Drink plenty of water. . After receiving IV contrast, you may experience a mild  flushed feeling. This is normal. . On occasion, you may experience a mild rash up to 24 hours after the test. This is not dangerous. If this occurs, you can take Benadryl 25 mg and increase your fluid intake. . If you experience trouble breathing, this can be serious. If it is severe call 911 IMMEDIATELY. If it is mild, please call our office.      Electrical Cardioversion Electrical cardioversion is the delivery of a jolt of electricity to restore a normal rhythm to the heart. A rhythm that is too fast or is not regular keeps the heart from pumping well. In this procedure, sticky patches or metal paddles are placed on the chest to deliver electricity to the heart from a device. This procedure may be done in an emergency if:  There is low or no blood pressure as a result of the heart rhythm.  Normal rhythm must be restored as fast as possible to protect the brain and heart from further damage.  It may save a life.  This procedure may also be done for irregular or fast heart rhythms that are not immediately life-threatening. Tell a health care provider about:  Any allergies you have.  All medicines you are taking, including vitamins, herbs, eye drops, creams, and over-the-counter medicines.  Any problems you or family members have had with anesthetic medicines.  Any blood disorders you have.  Any surgeries you have had.  Any medical conditions you have.  Whether you are pregnant or may be pregnant. What are the risks? Generally, this is a safe procedure. However, problems may occur, including:  Allergic reactions to medicines.  A blood clot that breaks free and travels to other parts of your body.  The possible return of an abnormal heart rhythm within hours or days after the procedure.  Your heart stopping (cardiac arrest). This is rare.  What happens before the procedure? Medicines  Your health care provider may have you start taking: ? Blood-thinning medicines  (anticoagulants) so your blood does not clot as easily. ? Medicines may be given to help stabilize your heart rate and rhythm.  Ask your health care provider about changing or stopping your regular medicines. This is especially important if you are taking diabetes medicines or blood thinners. General instructions  Plan to have someone take you home from the hospital or clinic.  If you will be going home right after the procedure, plan to have someone with you for 24 hours.  Follow instructions from your health care provider about eating or drinking restrictions. What happens during the procedure?  To lower your risk of infection: ? Your health care team will wash or sanitize their hands. ? Your skin will be washed with soap.  An IV tube will be inserted into one of your veins.  You will be given a medicine to help you relax (sedative).  Sticky patches (electrodes) or metal paddles may be placed on your chest.  An electrical shock will be delivered. The procedure may vary among health care providers and hospitals. What happens  after the procedure?  Your blood pressure, heart rate, breathing rate, and blood oxygen level will be monitored until the medicines you were given have worn off.  Do not drive for 24 hours if you were given a sedative.  Your heart rhythm will be watched to make sure it does not change. This information is not intended to replace advice given to you by your health care provider. Make sure you discuss any questions you have with your health care provider. Document Released: 08/08/2002 Document Revised: 04/16/2016 Document Reviewed: 02/22/2016 Elsevier Interactive Patient Education  2017 [employer].

## 2018-08-05 ENCOUNTER — Encounter: Payer: Self-pay | Admitting: Cardiology

## 2018-08-05 NOTE — Assessment & Plan Note (Signed)
Not sure if this is new onset versus just new diagnosis of A. fib.  I think she may have been having some palpitation episodes in the past that she remembers, but this the first time is been diagnosed.   May very well be related to her bronchitis at this time. Symptom really is only fatigue and exertional dyspnea with intermittent chest discomfort.  Plan:  Continue Xarelto for anticoagulation  Initiate Multaq for rhythm/rate control.  Depending upon further evaluation may consider beta-blocker.  Ischemic evaluation with coronary CT angiogram with possible FFR  Structural evaluation with echocardiogram

## 2018-08-05 NOTE — Assessment & Plan Note (Signed)
Currently not on medications.  Monitored by PCP.  LDL just this August was 142. Clearly, depending upon ischemic evaluation if there is any evidence of CAD will need to be more aggressive. (1 reason for checking coronary CTA versus Myoview is that it will show evidence of coronary artery disease even nonobstructive)

## 2018-08-05 NOTE — Assessment & Plan Note (Signed)
Chest discomfort in the setting of A. fib with dyspnea is concerning for potential atypical anginal equivalent. Ischemic evaluation with possible CT FFR

## 2018-08-05 NOTE — Assessment & Plan Note (Signed)
Systolic murmur heard on exam.  Checking 2D echocardiogram.

## 2018-08-06 ENCOUNTER — Other Ambulatory Visit: Payer: Self-pay | Admitting: Internal Medicine

## 2018-08-06 DIAGNOSIS — B952 Enterococcus as the cause of diseases classified elsewhere: Secondary | ICD-10-CM | POA: Insufficient documentation

## 2018-08-06 DIAGNOSIS — N390 Urinary tract infection, site not specified: Principal | ICD-10-CM

## 2018-08-06 LAB — CULTURE, URINE COMPREHENSIVE
MICRO NUMBER: 91439400
SPECIMEN QUALITY:: ADEQUATE

## 2018-08-06 MED ORDER — AMPICILLIN 500 MG PO CAPS: 500.0000 mg | ORAL_CAPSULE | Freq: Three times a day (TID) | ORAL | 0 refills | Status: AC

## 2018-08-06 NOTE — Telephone Encounter (Signed)
Will need to place an referral to [facility] for medication management.Marland KitchenJohny Chess

## 2018-08-06 NOTE — Telephone Encounter (Signed)
Ok - done. Pls give samples if we have Thx

## 2018-08-09 ENCOUNTER — Telehealth: Payer: Self-pay | Admitting: Cardiology

## 2018-08-09 MED ORDER — APIXABAN 5 MG PO TABS: 5.0000 mg | ORAL_TABLET | Freq: Two times a day (BID) | ORAL | 1 refills | Status: DC

## 2018-08-09 NOTE — Telephone Encounter (Signed)
Patient called in stating that for the past month she has continued to feel weak, and sluggish. She states that she believes it is coming from her Xarelto. She mentioned stopping it, and I advised patient not to do that until advised to do so. Patient states she is on new medication for UTI right now, but she was wondering if she could switch to another medication or if how she is feeling is coming from the [place].  Please advise.  Thank you!

## 2018-08-09 NOTE — Telephone Encounter (Signed)
Those symptoms are not usually what we would see from [place].  She can try Eliquis if she wants, but needs to remember that it's twice daily.  She would be on 5 mg bid.  Maybe give her a 30 day free card to try.

## 2018-08-09 NOTE — Telephone Encounter (Signed)
Called patient advised of note from PharmD, patient would like to try the new medication. It was sent into pharmacy, and card was placed up front. Patient notified to come by and pick up from office.  No questions or concerns.

## 2018-08-09 NOTE — Telephone Encounter (Signed)
New message   Pt c/o medication issue:  1. Name of Medication: rivaroxaban (XARELTO) 20 MG TABS tablet  2. How are you currently taking this medication (dosage and times per day)? 1 time daily  3. Are you having a reaction (difficulty breathing--STAT)? n/a  4. What is your medication issue? Patient states that this medication makes her feel weak and feel bad. Patient wants to know if she still needs to take this medication?

## 2018-08-10 ENCOUNTER — Other Ambulatory Visit: Payer: Self-pay | Admitting: *Deleted

## 2018-08-10 DIAGNOSIS — I4819 Other persistent atrial fibrillation: Secondary | ICD-10-CM

## 2018-08-10 NOTE — Addendum Note (Signed)
Addended by: Barrington Ellison on: 08/10/2018 02:38 PM   Modules accepted: Orders

## 2018-08-10 NOTE — Patient Outreach (Addendum)
[facility]) Care Management  08/10/2018  [name] [name] 1933-04-19 [id number]   TELEPHONE SCREENING Referral date: 08/09/18 Referral source:Primary Care Providers Office Referral reason:Financial assistance with medications Insurance:[employer] [place] -Medicare Complete  Successful initial call to [name] [name] to complete telephone screening.  See flow sheets for details.   Subjective: [name] [name] states she was recently diagnosed with atrial fibrillation and that she has a strong family history of the cardiac dysrhythmia. She is scheduled for cardioversion on 09/06/2018. She says she could not afford the Xarelto she was initially prescribed for anticoagulation so she called her cardiologist yesterday and they changed her to Eliquis.. She says she was provided with a coupon and her monthly copay for the Eliquis and Multaq is $45.00. She is asking if she qualifies for any additional assistance.  She says she is also currently being treated for a urinary tract infection with ampicillin. She denies the need for a health coach for atrial fibrillation or HTN self management assistance at this time.  Plan: Will make referral to [facility] pharmacist to evaluate for possible financial assistance with cost of Eliquis and Multaq. Will mail successful outreach letter to [name] [name]'s home address.  Barrington Ellison RN,CCM,CDE [place] [facility] Coordinator Office Phone [phone] Office Fax [phone]

## 2018-08-11 ENCOUNTER — Ambulatory Visit (HOSPITAL_COMMUNITY): Payer: Medicare Other | Attending: Cardiology

## 2018-08-11 ENCOUNTER — Other Ambulatory Visit: Payer: Self-pay

## 2018-08-11 DIAGNOSIS — R0789 Other chest pain: Secondary | ICD-10-CM | POA: Diagnosis not present

## 2018-08-11 DIAGNOSIS — R011 Cardiac murmur, unspecified: Secondary | ICD-10-CM

## 2018-08-11 DIAGNOSIS — I4891 Unspecified atrial fibrillation: Secondary | ICD-10-CM | POA: Diagnosis not present

## 2018-08-11 HISTORY — PX: TRANSTHORACIC ECHOCARDIOGRAM: SHX275

## 2018-08-12 ENCOUNTER — Other Ambulatory Visit: Payer: Self-pay | Admitting: Pharmacist

## 2018-08-12 ENCOUNTER — Ambulatory Visit: Payer: Self-pay | Admitting: Pharmacist

## 2018-08-12 NOTE — Patient Outreach (Addendum)
[facility]) Care Management  [place]   08/12/2018  [name] [name] February 04, 1933 [id number]  Reason for referral: medication assistance  Referral source: [facility] RN CM Referral medication(s): Multaq, Eliquis Current insurance: U[employer]  PMHx: Afib, HTN, GERD, vit B deficiency   HPI:  Successful outreach call to Ms. [name] [name] with HIPAA identifiers verified. Patient states she is doing well today and agreeable to review medications telephonically.  She reports 100% compliance with medications.  She states that she has recently been started on Eliquis and Multaq.  She reports that they are both $45/month.  All other medications are affordable at this time.  She tolerates medications with no reported side effects.  She reports she is taking these for "her heart" and this aligns with diagnosis of Afib per DX in EMR.  Objective: Allergies  Allergen Reactions  . Anastrozole     REACTION: arthralgias  . Aspirin Other (See Comments)    Gi upset   . Ciprofloxacin Itching  . Letrozole     REACTION: leg pain    Medications Reviewed Today    Reviewed by Lavera Guise, [facility] (Pharmacist) on 08/12/18 at [place] List Status: <None>  Medication Order Taking? Sig Documenting Provider Last Dose Status Informant  acetaminophen (TYLENOL) 500 MG tablet [id number] Yes Take 1,000 mg by mouth every 6 (six) hours as needed.  [provider] Taking Active Self  amLODipine (NORVASC) 10 MG tablet [id number]  Take 1 tablet (10 mg total) by mouth daily. Plotnikov, Evie Lacks, MD  Active   apixaban (ELIQUIS) 5 MG TABS tablet [id number] Yes Take 1 tablet (5 mg total) by mouth 2 (two) times daily. Leonie Man, MD Taking Active   [employer] B-12 500 MCG SUBL [id number] Yes Place 2 tablets (1,000 mcg total) under the tongue daily. Plotnikov, Evie Lacks, MD Taking Active   diphenhydrAMINE (SOMINEX) 25 MG tablet [id number] Yes Take 25 mg by mouth at bedtime as needed for sleep. Reported on 11/05/2015  [provider] Taking Active   dronedarone (MULTAQ) 400 MG tablet [id number] Yes Take 1 tablet (400 mg total) by mouth 2 (two) times daily with a meal. Leonie Man, MD Taking Active   hydrocortisone 2.5 % ointment [id number] Yes Apply topically 2 (two) times daily. Plotnikov, Evie Lacks, MD Taking Active   losartan (COZAAR) 100 MG tablet [id number] Yes Take 1 tablet (100 mg total) by mouth daily. Plotnikov, Evie Lacks, MD Taking Active   Multiple Vitamin (MULTIVITAMIN WITH MINERALS) TABS tablet [id number] Yes Take 1 tablet by mouth daily. [provider] Taking Active Self  potassium chloride SA (KLOR-CON M20) 20 MEQ tablet [id number] Yes Take 1 tablet (20 mEq total) by mouth 2 (two) times daily. Plotnikov, Evie Lacks, MD Taking Active   pravastatin (PRAVACHOL) 20 MG tablet [id number] Yes Take 1 tablet (20 mg total) by mouth daily. Plotnikov, Evie Lacks, MD Taking Active   PROAIR HFA 108 [phone] Base) MCG/ACT inhaler [id number] Yes INHALE 2 PUFFS INTO LUNGS EVERY 4 TO 6 HOURS [provider] Taking Active   triamcinolone cream (KENALOG) 0.5 % [id number] Yes Apply 1 application topically 4 (four) times daily. On rash Plotnikov, Evie Lacks, MD Taking Active         Discontinued 08/12/18 1607 (Completed Course)           Assessment:  Drugs sorted by system:  Cardiovascular: Eliquis, Multaq, pravastatin, amlodipine, losartan  Vitamins/Minerals/Supplements: vit B12   Medication Review Findings:  . Medications were reviewed by  cardiologist at last visit on 08/04/18. EF 55-60% on 08/11/18. Continue current management at this time.  Medication Assistance Findings:  Extra Help:   _0  Already receiving Full Extra Help  _1  Already receiving Partial Extra Help  _2  Eligible based on reported income and assets  _3  Not Eligible based on reported income and assets  Patient Assistance Programs: -Will not apply for financial assistance for Eliquis and Multaq at this time as program  deadlines have been reached.  Patient instructed to call [place] Pharmacist once she feels OOP (5% and 2% respectively) has been met.       Plan: -I will f/u with patient next year to assess  OOP amount for patient assistance programs (Eliquis, Multaq) if therapy is still appropriate. -Patient given contact information if questions arise -Will close this case   Regina Eck, PharmD, [place]  [phone]

## 2018-08-18 ENCOUNTER — Encounter: Payer: Self-pay | Admitting: Internal Medicine

## 2018-08-18 ENCOUNTER — Ambulatory Visit (INDEPENDENT_AMBULATORY_CARE_PROVIDER_SITE_OTHER): Payer: Medicare Other | Admitting: Internal Medicine

## 2018-08-18 DIAGNOSIS — I4891 Unspecified atrial fibrillation: Secondary | ICD-10-CM | POA: Diagnosis not present

## 2018-08-18 DIAGNOSIS — R430 Anosmia: Secondary | ICD-10-CM | POA: Diagnosis not present

## 2018-08-18 DIAGNOSIS — E538 Deficiency of other specified B group vitamins: Secondary | ICD-10-CM

## 2018-08-18 DIAGNOSIS — N951 Menopausal and female climacteric states: Secondary | ICD-10-CM

## 2018-08-18 DIAGNOSIS — R32 Unspecified urinary incontinence: Secondary | ICD-10-CM

## 2018-08-18 DIAGNOSIS — I10 Essential (primary) hypertension: Secondary | ICD-10-CM

## 2018-08-18 MED ORDER — AMOXICILLIN-POT CLAVULANATE 875-125 MG PO TABS: 1.0000 | ORAL_TABLET | Freq: Two times a day (BID) | ORAL | 0 refills | Status: DC

## 2018-08-18 MED ORDER — MIRABEGRON ER 50 MG PO TB24: 50.0000 mg | ORAL_TABLET | Freq: Every day | ORAL | 11 refills | Status: DC

## 2018-08-18 MED ORDER — RESTORA PO CAPS: ORAL_CAPSULE | ORAL | 1 refills | Status: DC

## 2018-08-18 NOTE — Assessment & Plan Note (Addendum)
?  due to sinusitis Labs ok CT sinuses PO abx - empiric Augmentin and Restora CT sinuses if not better

## 2018-08-18 NOTE — Assessment & Plan Note (Signed)
On B12 

## 2018-08-18 NOTE — Progress Notes (Signed)
Subjective:  Patient ID: [name] [name], female    DOB: April 01, 1933  Age: 84 y.o. MRN: [id number]  CC: No chief complaint on file.   HPI [name] [name] presents for A fib C/o fatigue, sweats, no sense of taste/smell x months - not better. Can only taste OJ, pickles and lemons. C/o urinary incontinence - bad, not new  Outpatient Medications Prior to Visit  Medication Sig Dispense Refill  . acetaminophen (TYLENOL) 500 MG tablet Take 1,000 mg by mouth every 6 (six) hours as needed.     Marland Kitchen amLODipine (NORVASC) 10 MG tablet Take 1 tablet (10 mg total) by mouth daily. 90 tablet 3  . apixaban (ELIQUIS) 5 MG TABS tablet Take 1 tablet (5 mg total) by mouth 2 (two) times daily. 60 tablet 1  . [facility] B-12 500 MCG SUBL Place 2 tablets (1,000 mcg total) under the tongue daily. 200 tablet 3  . diphenhydrAMINE (SOMINEX) 25 MG tablet Take 25 mg by mouth at bedtime as needed for sleep. Reported on 11/05/2015    . dronedarone (MULTAQ) 400 MG tablet Take 1 tablet (400 mg total) by mouth 2 (two) times daily with a meal. 60 tablet 6  . hydrocortisone 2.5 % ointment Apply topically 2 (two) times daily. 30 g 3  . losartan (COZAAR) 100 MG tablet Take 1 tablet (100 mg total) by mouth daily. 90 tablet 3  . Multiple Vitamin (MULTIVITAMIN WITH MINERALS) TABS tablet Take 1 tablet by mouth daily.    . potassium chloride SA (KLOR-CON M20) 20 MEQ tablet Take 1 tablet (20 mEq total) by mouth 2 (two) times daily. 180 tablet 3  . pravastatin (PRAVACHOL) 20 MG tablet Take 1 tablet (20 mg total) by mouth daily. 90 tablet 3  . PROAIR HFA 108 (90 Base) MCG/ACT inhaler INHALE 2 PUFFS INTO LUNGS EVERY 4 TO 6 HOURS  0  . triamcinolone cream (KENALOG) 0.5 % Apply 1 application topically 4 (four) times daily. On rash 60 g 1   No facility-administered medications prior to visit.     ROS: Review of Systems  Constitutional: Positive for diaphoresis and fatigue. Negative for activity change, appetite change, chills and unexpected  weight change.  HENT: Negative for congestion, mouth sores and sinus pressure.   Eyes: Negative for visual disturbance.  Respiratory: Negative for cough and chest tightness.   Cardiovascular: Negative for palpitations and leg swelling.  Gastrointestinal: Negative for abdominal pain and nausea.  Endocrine: Negative for polyuria.  Genitourinary: Positive for frequency and urgency. Negative for difficulty urinating and vaginal pain.  Musculoskeletal: Positive for arthralgias, back pain, gait problem and myalgias. Negative for joint swelling and neck stiffness.  Skin: Negative for pallor.  Neurological: Positive for weakness. Negative for dizziness, tremors, numbness and headaches.  Psychiatric/Behavioral: Negative for confusion, sleep disturbance and suicidal ideas. The patient is nervous/anxious.     Objective:  BP 124/82 (BP Location: Right Arm, Patient Position: Sitting, Cuff Size: Normal)   Pulse 60   Temp 98 F (36.7 C) (Oral)   Ht 5\' 6"  (1.676 m)   Wt 206 lb (93.4 kg)   SpO2 97%   BMI 33.25 kg/m   BP Readings from Last 3 Encounters:  08/18/18 124/82  08/04/18 118/70  07/28/18 140/70    Wt Readings from Last 3 Encounters:  08/18/18 206 lb (93.4 kg)  08/04/18 205 lb (93 kg)  07/28/18 208 lb 8 oz (94.6 kg)    Physical Exam Constitutional:      General: She is not  in acute distress.    Appearance: She is well-developed.  HENT:     Head: Normocephalic.     Right Ear: External ear normal.     Left Ear: External ear normal.     Nose: Nose normal.  Eyes:     General:        Right eye: No discharge.        Left eye: No discharge.     Conjunctiva/sclera: Conjunctivae normal.     Pupils: Pupils are equal, round, and reactive to light.  Neck:     Musculoskeletal: Normal range of motion and neck supple.     Thyroid: No thyromegaly.     Vascular: No JVD.     Trachea: No tracheal deviation.  Cardiovascular:     Rate and Rhythm: Normal rate and regular rhythm.     Heart  sounds: Normal heart sounds.  Pulmonary:     Effort: No respiratory distress.     Breath sounds: No stridor. No wheezing.  Abdominal:     General: Bowel sounds are normal. There is no distension.     Palpations: Abdomen is soft. There is no mass.     Tenderness: There is no abdominal tenderness. There is no guarding or rebound.  Musculoskeletal:        General: No tenderness.  Lymphadenopathy:     Cervical: No cervical adenopathy.  Skin:    Findings: No erythema or rash.  Neurological:     Cranial Nerves: No cranial nerve deficit.     Motor: No abnormal muscle tone.     Coordination: Coordination normal.     Deep Tendon Reflexes: Reflexes normal.  Psychiatric:        Behavior: Behavior normal.        Thought Content: Thought content normal.        Judgment: Judgment normal.   Cane Poor mobility  Lab Results  Component Value Date   WBC 8.9 07/28/2018   HGB 13.1 07/28/2018   HCT 39.5 07/28/2018   PLT 257.0 07/28/2018   GLUCOSE 115 (H) 07/28/2018   CHOL 229 (H) 04/21/2018   TRIG 75.0 04/21/2018   HDL 71.80 04/21/2018   LDLCALC 142 (H) 04/21/2018   ALT 18 07/28/2018   AST 11 07/28/2018   NA 140 07/28/2018   K 3.9 07/28/2018   CL 99 07/28/2018   CREATININE 0.98 07/28/2018   BUN 15 07/28/2018   CO2 34 (H) 07/28/2018   TSH 1.74 07/28/2018   INR 1.67 (H) 05/27/2014   HGBA1C 5.6 12/29/2014    US Abdomen Complete  Result Date: 03/12/2018 CLINICAL DATA:  Abdominal pain and microscopic hematuria EXAM: ABDOMEN ULTRASOUND COMPLETE COMPARISON:  None. FINDINGS: Gallbladder: Within the gallbladder, there are multiple echogenic foci which move and shadow consistent with cholelithiasis. There is also sludge in the gallbladder. There is no gallbladder wall thickening or pericholecystic fluid. No sonographic Murphy sign noted by sonographer. Common bile duct: Diameter: 4 mm. No intrahepatic, common hepatic, or common bile duct dilatation. Liver: No focal lesion identified. Within  normal limits in parenchymal echogenicity. Portal vein is patent on color Doppler imaging with normal direction of blood flow towards the liver. IVC: No abnormality visualized. Pancreas: No pancreatic mass or inflammatory focus. Spleen: Limited visualization. Visualized portions appear unremarkable. Right Kidney: Length: 9.7 cm. Echogenicity within normal limits. No mass or hydronephrosis visualized. Left Kidney: Length: 9.9 cm. Echogenicity within normal limits. No mass visualized. There is slight fullness of the left renal collecting system. Abdominal aorta:  No aneurysm visualized. There is aortic atherosclerosis. Other findings: No demonstrable ascites. IMPRESSION: 1. Cholelithiasis and sludge in gallbladder. No gallbladder wall thickening or pericholecystic fluid. 2. Limited visualization of spleen due to body habitus. Visualized portions of spleen appear normal. 3. Mild fullness of the left renal collecting system without focus of obstruction evident by ultrasound. 4.  Aortic atherosclerosis. Aortic Atherosclerosis (ICD10-I70.0). Electronically Signed   By: Lowella Grip III M.D.   On: 03/12/2018 12:41    Assessment & Plan:   There are no diagnoses linked to this encounter.   No orders of the defined types were placed in this encounter.    Follow-up: No follow-ups on file.  Walker Kehr, MD

## 2018-08-18 NOTE — Assessment & Plan Note (Signed)
2019 

## 2018-08-18 NOTE — Assessment & Plan Note (Addendum)
post-URI; no sense of taste/smell x months - not better. Can only taste OJ, pickles and lemons. PO abx - empiric Augmentin and Restora CT sinuses if not better

## 2018-08-18 NOTE — Assessment & Plan Note (Signed)
Dr Ellyn Hack 11/19 - new Eliquis, Multaq

## 2018-08-18 NOTE — Assessment & Plan Note (Signed)
  Losartan, Amlodipine

## 2018-08-28 NOTE — H&P (View-Only) (Signed)
Cardiology Office Note   Date:  08/30/2018   ID:  [name] [name], DOB 06-21-1933, MRN [id number]  PCP:  Cassandria Anger, MD  Cardiologist:  Nathalie  Chief Complaint  Patient presents with  . Atrial Fibrillation  . Follow-up     History of Present Illness: [name] [name] is a 84 y.o. female who presents for ongoing assessment and management of atrial fibrillation, with other history to include hyperlipidemia, hypertension, iron deficiency anemia, diverticulosis of the colon, GERD, and breast cancer.  The patient was seen originally by Dr. Ellyn Hack on 08/04/2018 on consultation for newly diagnosed atrial fibrillation.  The patient was continued on Xarelto for anticoagulation, Multitak was initiated for rhythm/rate control, a ischemic evaluation was planned with coronary CT and an echocardiogram was planned.   The patient called our office on 08/09/2018 complaining of feeling weak and sluggish.  The patient wanted to stop Xarelto.  She was advised against this.  The patient was also being treated for UTI by PCP.  Consideration for changing to Eliquis was discussed but not ordered at the time.  She is here for follow-up concerning her ongoing symptoms.  Echocardiogram completed on 08/11/2018 revealed mildly reduced ejection fraction of 45% to 50% with diffuse hypokinesis.  She was in atrial fib throughout the study.  Unable to calculate diastolic function.  The left atrium was moderately dilated, there was no significant valvular heart disease.  Cardiac CT is pending she is scheduled for 09/14/2018. She is scheduled for DCCV on 09/20/2018 at 9 am with Dr. Harrington Challenger.  Her only complaint is some dysphasia when swallowing pills and sometimes food. She denies bleeding. She is unaware of irregular HR. She denies chest pain,or DOE.  She has been compliant with medication regimen.   Past Medical History:  Diagnosis Date  . Acute lymphadenitis of arm   . Anemia    iron deficiency  . Cancer (HCC)      breast, skin  . Depression 2009   grief  . Diverticulosis of colon 2004   Dr Deatra Ina  . GERD (gastroesophageal reflux disease)   . Heart murmur   . Hematuria    Dr Terance Hart  . Hyperlipidemia   . Hypertension   . Osteoarthritis, knee   . Vitamin B12 deficiency   . Vitamin D deficiency     Past Surgical History:  Procedure Laterality Date  . ABDOMINAL HYSTERECTOMY    . JOINT REPLACEMENT    . L groin skin cancer  2011  . MASTECTOMY  09-13-08   left and right  . TOTAL KNEE ARTHROPLASTY  06-04-98  . TOTAL KNEE ARTHROPLASTY Right 05/24/2014   Procedure: RIGHT TOTAL KNEE ARTHROPLASTY;  Surgeon: Newt Minion, MD;  Location: [place];  Service: Orthopedics;  Laterality: Right;     Current Outpatient Medications  Medication Sig Dispense Refill  . acetaminophen (TYLENOL) 500 MG tablet Take 500 mg by mouth 2 (two) times daily as needed for moderate pain.     Marland Kitchen amLODipine (NORVASC) 10 MG tablet Take 1 tablet (10 mg total) by mouth daily. 90 tablet 3  . amoxicillin-clavulanate (AUGMENTIN) 875-125 MG tablet Take 1 tablet by mouth 2 (two) times daily. 28 tablet 0  . apixaban (ELIQUIS) 5 MG TABS tablet Take 1 tablet (5 mg total) by mouth 2 (two) times daily. 60 tablet 1  . [facility] B-12 500 MCG SUBL Place 2 tablets (1,000 mcg total) under the tongue daily. 200 tablet 3  . diphenhydrAMINE (SOMINEX) 25 MG tablet Take 25  mg by mouth at bedtime.     . dronedarone (MULTAQ) 400 MG tablet Take 1 tablet (400 mg total) by mouth 2 (two) times daily with a meal. 60 tablet 6  . hydrocortisone 2.5 % ointment Apply topically 2 (two) times daily. (Patient taking differently: Apply 1 application topically 2 (two) times daily as needed (rash). ) 30 g 3  . losartan (COZAAR) 100 MG tablet Take 1 tablet (100 mg total) by mouth daily. 90 tablet 3  . mirabegron ER (MYRBETRIQ) 50 MG TB24 tablet Take 1 tablet (50 mg total) by mouth daily. 30 tablet 11  . Multiple Vitamin (MULTIVITAMIN WITH MINERALS) TABS tablet Take 1  tablet by mouth daily.    . potassium chloride SA (KLOR-CON M20) 20 MEQ tablet Take 1 tablet (20 mEq total) by mouth 2 (two) times daily. 180 tablet 3  . pravastatin (PRAVACHOL) 20 MG tablet Take 1 tablet (20 mg total) by mouth daily. 90 tablet 3  . Probiotic Product (RESTORA) CAPS 1 po qd (Patient not taking: Reported on 08/26/2018) 30 capsule 1  . triamcinolone cream (KENALOG) 0.5 % Apply 1 application topically 4 (four) times daily. On rash (Patient taking differently: Apply 1 application topically 4 (four) times daily as needed (rash). ) 60 g 1   No current facility-administered medications for this visit.     Allergies:   Anastrozole; Aspirin; Ciprofloxacin; and Letrozole    Social History:  The patient  reports that she has never smoked. She has never used smokeless tobacco. She reports that she does not drink alcohol or use drugs.   Family History:  The patient's family history includes Atrial fibrillation in an other family member; Cancer in her maternal aunt, sister, and another family member; Coronary artery disease in an other family member.    ROS: All other systems are reviewed and negative. Unless otherwise mentioned in H&P    PHYSICAL EXAM: VS:  There were no vitals taken for this visit. , BMI There is no height or weight on file to calculate BMI. GEN: Well nourished, well developed, in no acute distress HEENT: normal Neck: no JVD, carotid bruits, or masses Cardiac: RRR, distant heart sounds; no murmurs, rubs, or gallops,no edema  Respiratory:  Clear to auscultation bilaterally, normal work of breathing GI: soft, nontender, nondistended, + BS MS: no deformity or atrophy Skin: warm and dry, no rash Neuro:  Strength and sensation are intact Psych: euthymic mood, full affect   EKG: Atrial fibrillation, rate of 92 bpm. LVH is noted.    Recent Labs: 07/28/2018: ALT 18; BUN 15; Creatinine, Ser 0.98; Hemoglobin 13.1; Platelets 257.0; Potassium 3.9; Sodium 140; TSH 1.74     Lipid Panel    Component Value Date/Time   CHOL 229 (H) 04/21/2018 0923   TRIG 75.0 04/21/2018 0923   HDL 71.80 04/21/2018 0923   CHOLHDL 3 04/21/2018 0923   VLDL 15.0 04/21/2018 0923   LDLCALC 142 (H) 04/21/2018 0923      Wt Readings from Last 3 Encounters:  08/18/18 206 lb (93.4 kg)  08/04/18 205 lb (93 kg)  07/28/18 208 lb 8 oz (94.6 kg)      Other studies Reviewed Echocardiogram 12/111/2019 Left ventricle: The cavity size was normal. Systolic function was   mildly reduced. The estimated ejection fraction was in the range   of 45% to 50%. Diffuse hypokinesis. Although no diagnostic   regional wall motion abnormality was identified, this possibility   cannot be completely excluded on the basis of this study.  Longitudinal strain, 2D: -11.4 %. - Aortic valve: Sclerosis without stenosis. - Mitral valve: There was mild regurgitation. - Left atrium: The atrium was moderately dilated. - Right atrium: The atrium was moderately to severely dilated. - Atrial septum: No defect or patent foramen ovale was identified.  Impressions:  - Afib throughout study. Mildly reduced LV EF and decreased global   longitudinal strain. Diffuse mild hypokinesis. Abnormal tissue   doppler, but cannot grade diastolic dysfunction due to afib.  ASSESSMENT AND PLAN:  1. Atrial fib: She is currently tolerating Eliquis and Multaq. No issues with bleeding or dizziness. She is unaware of her irregular HR. She is planned for DCCV on 09/06/2018 with Dr. Harrington Challenger.   2. CAD: She is scheduled for cardiac CTA on 09/14/2018. She is scheduled to take metoprolol day of cardiac CT. She has Rx filled already. Will discuss results on follow up with Dr. Ellyn Hack.  3. Hypertension: She is well controlled today. No changes in her regimen  4. Dysphagia: She has trouble swallowing pills and sometimes foods. She is to see PCO for possible referral to GI for EDG.     Current medicines are reviewed at length with the  patient today.    Labs/ tests ordered today include: None   Phill Myron. West Pugh, ANP, [facility]   08/30/2018 8:38 AM    [place] [address] [place] Office [phone] Fax [phone]

## 2018-08-28 NOTE — Progress Notes (Signed)
Cardiology Office Note   Date:  08/30/2018   ID:  [name] [name], DOB [birth year], MRN [id number]  PCP:  Cassandria Anger, MD  Cardiologist:  Sun[place]  Chief Complaint  Patient presents with  . Atrial Fibrillation  . Follow-up     History of Present Illness: [name] [name] is a 84 y.o. female who presents for ongoing assessment and management of atrial fibrillation, with other history to include hyperlipidemia, hypertension, iron deficiency anemia, diverticulosis of the colon, GERD, and breast cancer.  The patient was seen originally by Dr. Ellyn Hack on 08/04/2018 on consultation for newly diagnosed atrial fibrillation.  The patient was continued on Xarelto for anticoagulation, Multitak was initiated for rhythm/rate control, a ischemic evaluation was planned with coronary CT and an echocardiogram was planned.   The patient called our office on 08/09/2018 complaining of feeling weak and sluggish.  The patient wanted to stop Xarelto.  She was advised against this.  The patient was also being treated for UTI by PCP.  Consideration for changing to Eliquis was discussed but not ordered at the time.  She is here for follow-up concerning her ongoing symptoms.  Echocardiogram completed on 08/11/2018 revealed mildly reduced ejection fraction of 45% to 50% with diffuse hypokinesis.  She was in atrial fib throughout the study.  Unable to calculate diastolic function.  The left atrium was moderately dilated, there was no significant valvular heart disease.  Cardiac CT is pending she is scheduled for 09/14/2018. She is scheduled for DCCV on 09/20/2018 at 9 am with Dr. Harrington Challenger.  Her only complaint is some dysphasia when swallowing pills and sometimes food. She denies bleeding. She is unaware of irregular HR. She denies chest pain,or DOE.  She has been compliant with medication regimen.   Past Medical History:  Diagnosis Date  . Acute lymphadenitis of arm   . Anemia    iron deficiency  . Cancer (HCC)      breast, skin  . Depression 2009   grief  . Diverticulosis of colon 2004   Dr Deatra Ina  . GERD (gastroesophageal reflux disease)   . Heart murmur   . Hematuria    Dr Terance Hart  . Hyperlipidemia   . Hypertension   . Osteoarthritis, knee   . Vitamin B12 deficiency   . Vitamin D deficiency     Past Surgical History:  Procedure Laterality Date  . ABDOMINAL HYSTERECTOMY    . JOINT REPLACEMENT    . L groin skin cancer  2011  . MASTECTOMY  09-13-08   left and right  . TOTAL KNEE ARTHROPLASTY  06-04-98  . TOTAL KNEE ARTHROPLASTY Right 05/24/2014   Procedure: RIGHT TOTAL KNEE ARTHROPLASTY;  Surgeon: Newt Minion, MD;  Location: [place];  Service: Orthopedics;  Laterality: Right;     Current Outpatient Medications  Medication Sig Dispense Refill  . acetaminophen (TYLENOL) 500 MG tablet Take 500 mg by mouth 2 (two) times daily as needed for moderate pain.     Marland Kitchen amLODipine (NORVASC) 10 MG tablet Take 1 tablet (10 mg total) by mouth daily. 90 tablet 3  . amoxicillin-clavulanate (AUGMENTIN) 875-125 MG tablet Take 1 tablet by mouth 2 (two) times daily. 28 tablet 0  . apixaban (ELIQUIS) 5 MG TABS tablet Take 1 tablet (5 mg total) by mouth 2 (two) times daily. 60 tablet 1  . CVS B-12 500 MCG SUBL Place 2 tablets (1,000 mcg total) under the tongue daily. 200 tablet 3  . diphenhydrAMINE (SOMINEX) 25 MG tablet Take 25  mg by mouth at bedtime.     . dronedarone (MULTAQ) 400 MG tablet Take 1 tablet (400 mg total) by mouth 2 (two) times daily with a meal. 60 tablet 6  . hydrocortisone 2.5 % ointment Apply topically 2 (two) times daily. (Patient taking differently: Apply 1 application topically 2 (two) times daily as needed (rash). ) 30 g 3  . losartan (COZAAR) 100 MG tablet Take 1 tablet (100 mg total) by mouth daily. 90 tablet 3  . mirabegron ER (MYRBETRIQ) 50 MG TB24 tablet Take 1 tablet (50 mg total) by mouth daily. 30 tablet 11  . Multiple Vitamin (MULTIVITAMIN WITH MINERALS) TABS tablet Take 1  tablet by mouth daily.    . potassium chloride SA (KLOR-CON M20) 20 MEQ tablet Take 1 tablet (20 mEq total) by mouth 2 (two) times daily. 180 tablet 3  . pravastatin (PRAVACHOL) 20 MG tablet Take 1 tablet (20 mg total) by mouth daily. 90 tablet 3  . Probiotic Product (RESTORA) CAPS 1 po qd (Patient not taking: Reported on 08/26/2018) 30 capsule 1  . triamcinolone cream (KENALOG) 0.5 % Apply 1 application topically 4 (four) times daily. On rash (Patient taking differently: Apply 1 application topically 4 (four) times daily as needed (rash). ) 60 g 1   No current facility-administered medications for this visit.     Allergies:   Anastrozole; Aspirin; Ciprofloxacin; and Letrozole    Social History:  The patient  reports that she has never smoked. She has never used smokeless tobacco. She reports that she does not drink alcohol or use drugs.   Family History:  The patient's family history includes Atrial fibrillation in an other family member; Cancer in her maternal aunt, sister, and another family member; Coronary artery disease in an other family member.    ROS: All other systems are reviewed and negative. Unless otherwise mentioned in H&P    PHYSICAL EXAM: VS:  There were no vitals taken for this visit. , BMI There is no height or weight on file to calculate BMI. GEN: Well nourished, well developed, in no acute distress HEENT: normal Neck: no JVD, carotid bruits, or masses Cardiac: RRR, distant heart sounds; no murmurs, rubs, or gallops,no edema  Respiratory:  Clear to auscultation bilaterally, normal work of breathing GI: soft, nontender, nondistended, + BS MS: no deformity or atrophy Skin: warm and dry, no rash Neuro:  Strength and sensation are intact Psych: euthymic mood, full affect   EKG: Atrial fibrillation, rate of 92 bpm. LVH is noted.    Recent Labs: 07/28/2018: ALT 18; BUN 15; Creatinine, Ser 0.98; Hemoglobin 13.1; Platelets 257.0; Potassium 3.9; Sodium 140; TSH 1.74     Lipid Panel    Component Value Date/Time   CHOL 229 (H) 04/21/2018 0923   TRIG 75.0 04/21/2018 0923   HDL 71.80 04/21/2018 0923   CHOLHDL 3 04/21/2018 0923   VLDL 15.0 04/21/2018 0923   LDLCALC 142 (H) 04/21/2018 0923      Wt Readings from Last 3 Encounters:  08/18/18 206 lb (93.4 kg)  08/04/18 205 lb (93 kg)  07/28/18 208 lb 8 oz (94.6 kg)      Other studies Reviewed Echocardiogram 12/111/2019 Left ventricle: The cavity size was normal. Systolic function was   mildly reduced. The estimated ejection fraction was in the range   of 45% to 50%. Diffuse hypokinesis. Although no diagnostic   regional wall motion abnormality was identified, this possibility   cannot be completely excluded on the basis of this study.  Longitudinal strain, 2D: -11.4 %. - Aortic valve: Sclerosis without stenosis. - Mitral valve: There was mild regurgitation. - Left atrium: The atrium was moderately dilated. - Right atrium: The atrium was moderately to severely dilated. - Atrial septum: No defect or patent foramen ovale was identified.  Impressions:  - Afib throughout study. Mildly reduced LV EF and decreased global   longitudinal strain. Diffuse mild hypokinesis. Abnormal tissue   doppler, but cannot grade diastolic dysfunction due to afib.  ASSESSMENT AND PLAN:  1. Atrial fib: She is currently tolerating Eliquis and Multaq. No issues with bleeding or dizziness. She is unaware of her irregular HR. She is planned for DCCV on 09/06/2018 with Dr. Harrington Challenger.   2. CAD: She is scheduled for cardiac CTA on 09/14/2018. She is scheduled to take metoprolol day of cardiac CT. She has Rx filled already. Will discuss results on follow up with Dr. Ellyn Hack.  3. Hypertension: She is well controlled today. No changes in her regimen  4. Dysphagia: She has trouble swallowing pills and sometimes foods. She is to see PCO for possible referral to GI for EDG.     Current medicines are reviewed at length with the  patient today.    Labs/ tests ordered today include: None   Phill Myron. West Pugh, ANP, [facility]   08/30/2018 8:38 AM    [place] [address] Office [phone] Fax [phone]

## 2018-08-30 ENCOUNTER — Encounter: Payer: Self-pay | Admitting: Adult Health

## 2018-08-30 ENCOUNTER — Ambulatory Visit (INDEPENDENT_AMBULATORY_CARE_PROVIDER_SITE_OTHER): Payer: Medicare Other | Admitting: Adult Health

## 2018-08-30 VITALS — BP 136/60 | HR 92 | Ht 62.0 in | Wt 204.2 lb

## 2018-08-30 DIAGNOSIS — I10 Essential (primary) hypertension: Secondary | ICD-10-CM

## 2018-08-30 DIAGNOSIS — R1310 Dysphagia, unspecified: Secondary | ICD-10-CM | POA: Diagnosis not present

## 2018-08-30 DIAGNOSIS — Z01818 Encounter for other preprocedural examination: Secondary | ICD-10-CM | POA: Diagnosis not present

## 2018-08-30 DIAGNOSIS — I2510 Atherosclerotic heart disease of native coronary artery without angina pectoris: Secondary | ICD-10-CM

## 2018-08-30 DIAGNOSIS — I4891 Unspecified atrial fibrillation: Secondary | ICD-10-CM

## 2018-08-30 DIAGNOSIS — R0789 Other chest pain: Secondary | ICD-10-CM | POA: Diagnosis not present

## 2018-08-30 LAB — BASIC METABOLIC PANEL
BUN/Creatinine Ratio: 11 — ABNORMAL LOW (ref 12–28)
BUN: 12 mg/dL (ref 8–27)
CHLORIDE: 101 mmol/L (ref 96–106)
CO2: 26 mmol/L (ref 20–29)
Calcium: 9.9 mg/dL (ref 8.7–10.3)
Creatinine, Ser: 1.06 mg/dL — ABNORMAL HIGH (ref 0.57–1.00)
GFR calc Af Amer: 56 mL/min/{1.73_m2} — ABNORMAL LOW (ref >59)
GFR calc non Af Amer: 49 mL/min/{1.73_m2} — ABNORMAL LOW (ref >59)
Glucose: 90 mg/dL (ref 65–99)
Potassium: 4 mmol/L (ref 3.5–5.2)
Sodium: 143 mmol/L (ref 134–144)

## 2018-08-30 LAB — CBC
HEMATOCRIT: 38.3 % (ref 34.0–46.6)
Hemoglobin: 12.5 g/dL (ref 11.1–15.9)
MCH: 28.5 pg (ref 26.6–33.0)
MCHC: 32.6 g/dL (ref 31.5–35.7)
MCV: 87 fL (ref 79–97)
Platelets: 261 10*3/uL (ref 150–450)
RBC: 4.39 x10E6/uL (ref 3.77–5.28)
RDW: 14.8 % (ref 12.3–15.4)
WBC: 5 10*3/uL (ref 3.4–10.8)

## 2018-08-30 NOTE — Patient Instructions (Signed)
Medication Instructions:  MAKE SURE TO TAKE YOUR METOPROLOL THE MORNING OF THE CARDIOVERSION. If you need a refill on your cardiac medications before your next appointment, please call your pharmacy.  Labwork: If you have labs (blood work) drawn today and your tests are completely normal, you will receive your results only by [place] (if you have MyChart) -OR- A paper copy in the mail.  If you have any lab test that is abnormal or we need to change your treatment, we will call you to review these results.  Special Instructions: CARDIOVERSION SCHEDULED 09-06-2018   Follow-Up: You will need a follow up appointment [place].   You may see Glenetta Hew, MD or one of the following Advanced Practice Providers on your designated Care Team:  Jory Sims, DNP   Rosaria Ferries, PA-C    At [facility], you and your health needs are our priority.  As part of our continuing mission to provide you with exceptional heart care, we have created designated Provider Care Teams.  These Care Teams include your primary Cardiologist (physician) and Advanced Practice Providers (APPs -  Physician Assistants and Nurse Practitioners) who all work together to provide you with the care you need, when you need it.

## 2018-08-31 ENCOUNTER — Other Ambulatory Visit: Payer: Self-pay | Admitting: *Deleted

## 2018-08-31 DIAGNOSIS — Z01818 Encounter for other preprocedural examination: Secondary | ICD-10-CM

## 2018-08-31 DIAGNOSIS — I4891 Unspecified atrial fibrillation: Secondary | ICD-10-CM

## 2018-09-02 ENCOUNTER — Telehealth: Payer: Self-pay | Admitting: Cardiology

## 2018-09-02 ENCOUNTER — Ambulatory Visit: Payer: Self-pay | Admitting: Pharmacist

## 2018-09-02 NOTE — Addendum Note (Signed)
Addended by: Lottie Dawson D on: 09/02/2018 01:39 PM   Modules accepted: Orders

## 2018-09-02 NOTE — Telephone Encounter (Signed)
New Message   Pt c/o medication issue:  1. Name of Medication: Amlodipine and Losartan   2. How are you currently taking this medication (dosage and times per day)?   3. Are you having a reaction (difficulty breathing--STAT)? No   4. What is your medication issue? Pt is scheduled to have her heart shocked on Monday. She is wondering if she needs to take her medication the day of her procedure. Please call

## 2018-09-02 NOTE — Telephone Encounter (Signed)
Pt calling to inquire if she needed to hold her amlodipine and losartan the morning prior to cardioversion. Pt informed that she is ok to take all of her medication as prescribed. Pt states she will also take the metoprolol an hr before procedure. Informed pt that she is to only take the one dose of metoprolol 1 hour prior to the Cardiac CTA. Pt verbalized understanding.

## 2018-09-05 NOTE — Anesthesia Preprocedure Evaluation (Addendum)
Anesthesia Evaluation  Patient identified by MRN, date of birth, ID band Patient awake    Reviewed: Allergy & Precautions, NPO status , Patient's Chart, lab work & pertinent test results  History of Anesthesia Complications Negative for: history of anesthetic complications  Airway Mallampati: II  TM Distance: >3 FB Neck ROM: Full    Dental  (+) Teeth Intact   Pulmonary COPD,    Pulmonary exam normal breath sounds clear to auscultation       Cardiovascular hypertension, Pt. on medications Normal cardiovascular exam+ dysrhythmias Atrial Fibrillation  Rhythm:Irregular Rate:Normal  TTE 12/19: EF 45-50%, diffuse hypokinesis,  Aortic sclerosis without stenosis, mild MR, moderate LAE, moderate/severe RAE    Neuro/Psych  Headaches, Depression Sciatica    GI/Hepatic Neg liver ROS, GERD  Controlled,  Endo/Other  negative endocrine ROS  Renal/GU negative Renal ROS     Musculoskeletal  (+) Arthritis ,   Abdominal   Peds  Hematology negative hematology ROS (+)   Anesthesia Other Findings Day of surgery medications reviewed with the patient.  Reproductive/Obstetrics                            Anesthesia Physical Anesthesia Plan  ASA: III  Anesthesia Plan: General   Post-op Pain Management:    Induction: Intravenous  PONV Risk Score and Plan: 3 and Treatment may vary due to age or medical condition and Propofol infusion  Airway Management Planned: Mask  Additional Equipment:   Intra-op Plan:   Post-operative Plan:   Informed Consent: I have reviewed the patients History and Physical, chart, labs and discussed the procedure including the risks, benefits and alternatives for the proposed anesthesia with the patient or authorized representative who has indicated his/her understanding and acceptance.     Plan Discussed with: CRNA  Anesthesia Plan Comments:        Anesthesia Quick  Evaluation

## 2018-09-06 ENCOUNTER — Ambulatory Visit (HOSPITAL_COMMUNITY)
Admission: RE | Admit: 2018-09-06 | Discharge: 2018-09-06 | Disposition: A | Discharge status: Home / Self Care | Payer: Medicare Other | Attending: Internal Medicine | Admitting: Internal Medicine

## 2018-09-06 ENCOUNTER — Encounter (HOSPITAL_COMMUNITY)
Admission: RE | Disposition: A | Discharge status: Home / Self Care | Payer: Self-pay | Source: Home / Self Care | Attending: Internal Medicine

## 2018-09-06 ENCOUNTER — Ambulatory Visit (HOSPITAL_COMMUNITY): Payer: Medicare Other | Admitting: Anesthesiology

## 2018-09-06 ENCOUNTER — Encounter (HOSPITAL_COMMUNITY): Payer: Self-pay

## 2018-09-06 ENCOUNTER — Other Ambulatory Visit: Payer: Self-pay

## 2018-09-06 DIAGNOSIS — I4891 Unspecified atrial fibrillation: Secondary | ICD-10-CM | POA: Diagnosis not present

## 2018-09-06 DIAGNOSIS — Z886 Allergy status to analgesic agent status: Secondary | ICD-10-CM | POA: Insufficient documentation

## 2018-09-06 DIAGNOSIS — R1310 Dysphagia, unspecified: Secondary | ICD-10-CM | POA: Diagnosis not present

## 2018-09-06 DIAGNOSIS — E785 Hyperlipidemia, unspecified: Secondary | ICD-10-CM | POA: Diagnosis not present

## 2018-09-06 DIAGNOSIS — Z8249 Family history of ischemic heart disease and other diseases of the circulatory system: Secondary | ICD-10-CM | POA: Insufficient documentation

## 2018-09-06 DIAGNOSIS — I454 Nonspecific intraventricular block: Secondary | ICD-10-CM | POA: Insufficient documentation

## 2018-09-06 DIAGNOSIS — J449 Chronic obstructive pulmonary disease, unspecified: Secondary | ICD-10-CM | POA: Insufficient documentation

## 2018-09-06 DIAGNOSIS — Z79899 Other long term (current) drug therapy: Secondary | ICD-10-CM | POA: Diagnosis not present

## 2018-09-06 DIAGNOSIS — Z881 Allergy status to other antibiotic agents status: Secondary | ICD-10-CM | POA: Diagnosis not present

## 2018-09-06 DIAGNOSIS — Z90710 Acquired absence of both cervix and uterus: Secondary | ICD-10-CM | POA: Insufficient documentation

## 2018-09-06 DIAGNOSIS — Z01818 Encounter for other preprocedural examination: Secondary | ICD-10-CM

## 2018-09-06 DIAGNOSIS — M179 Osteoarthritis of knee, unspecified: Secondary | ICD-10-CM | POA: Insufficient documentation

## 2018-09-06 DIAGNOSIS — K219 Gastro-esophageal reflux disease without esophagitis: Secondary | ICD-10-CM | POA: Insufficient documentation

## 2018-09-06 DIAGNOSIS — I2510 Atherosclerotic heart disease of native coronary artery without angina pectoris: Secondary | ICD-10-CM | POA: Diagnosis not present

## 2018-09-06 DIAGNOSIS — Z7901 Long term (current) use of anticoagulants: Secondary | ICD-10-CM | POA: Insufficient documentation

## 2018-09-06 DIAGNOSIS — D509 Iron deficiency anemia, unspecified: Secondary | ICD-10-CM | POA: Insufficient documentation

## 2018-09-06 DIAGNOSIS — I10 Essential (primary) hypertension: Secondary | ICD-10-CM | POA: Insufficient documentation

## 2018-09-06 DIAGNOSIS — E559 Vitamin D deficiency, unspecified: Secondary | ICD-10-CM | POA: Diagnosis not present

## 2018-09-06 HISTORY — PX: CARDIOVERSION: SHX1299

## 2018-09-06 SURGERY — CARDIOVERSION
Anesthesia: General

## 2018-09-06 MED ORDER — SODIUM CHLORIDE 0.9 % IV SOLN
INTRAVENOUS | Status: DC
  Administered 2018-09-06: 08:00:00 via INTRAVENOUS

## 2018-09-06 MED ORDER — LIDOCAINE 2% (20 MG/ML) 5 ML SYRINGE
INTRAMUSCULAR | Status: DC | PRN
  Administered 2018-09-06: 40 mg via INTRAVENOUS

## 2018-09-06 MED ORDER — PROPOFOL 10 MG/ML IV BOLUS
INTRAVENOUS | Status: DC | PRN
  Administered 2018-09-06: 100 mg via INTRAVENOUS

## 2018-09-06 NOTE — Discharge Instructions (Signed)
Electrical Cardioversion, Care After °This sheet gives you information about how to care for yourself after your procedure. Your health care provider may also give you more specific instructions. If you have problems or questions, contact your health care provider. °What can I expect after the procedure? °After the procedure, it is common to have: °· Some redness on the skin where the shocks were given. °Follow these instructions at home: ° °· Do not drive for 24 hours if you were given a medicine to help you relax (sedative). °· Take over-the-counter and prescription medicines only as told by your health care provider. °· Ask your health care provider how to check your pulse. Check it often. °· Rest for 48 hours after the procedure or as told by your health care provider. °· Avoid or limit your caffeine use as told by your health care provider. °Contact a health care provider if: °· You feel like your heart is beating too quickly or your pulse is not regular. °· You have a serious muscle cramp that does not go away. °Get help right away if: ° °· You have discomfort in your chest. °· You are dizzy or you feel faint. °· You have trouble breathing or you are short of breath. °· Your speech is slurred. °· You have trouble moving an arm or leg on one side of your body. °· Your fingers or toes turn cold or blue. °This information is not intended to replace advice given to you by your health care provider. Make sure you discuss any questions you have with your health care provider. °Document Released: 06/08/2013 Document Revised: 03/21/2016 Document Reviewed: 02/22/2016 °Elsevier Interactive Patient Education © 2019 [employer] ° °

## 2018-09-06 NOTE — Interval H&P Note (Signed)
History and Physical Interval Note:  09/06/2018 8:21 AM  [name] [name]  has presented today for surgery, with the diagnosis of atrial fibrillation  The various methods of treatment have been discussed with the patient and family. After consideration of risks, benefits and other options for treatment, the patient has consented to  Procedure(s): CARDIOVERSION (N/A) as a surgical intervention .  The patient's history has been reviewed, patient examined, no change in status, stable for surgery.  I have reviewed the patient's chart and labs.  Questions were answered to the patient's satisfaction.     Dorris Carnes

## 2018-09-06 NOTE — Op Note (Signed)
Cardioversion  Pt seen and examined   Appropriate for cardioversion  PT sedated by anesthesia with Propofol IV  WIth pads in AP position, cardioverrsion attempted with 200 J synchronized biphasic energy   There were initial atrial ectopy and then sinus beats seen   Frequent ectopy and then SR intermittent    Rhythm became more irregular     12 lead EKG pending  Procedure without complication.

## 2018-09-06 NOTE — Transfer of Care (Signed)
Immediate Anesthesia Transfer of Care Note  Patient: [name] [name]  Procedure(s) Performed: CARDIOVERSION (N/A )  Patient Location: Endoscopy Unit  Anesthesia Type:General  Level of Consciousness: awake, alert  and oriented  Airway & Oxygen Therapy: Patient Spontanous Breathing  Post-op Assessment: Report given to RN  Post vital signs: Reviewed and stable  Last Vitals:  Vitals Value Taken Time  BP    Temp    Pulse    Resp    SpO2      Last Pain:  Vitals:   09/06/18 0750  TempSrc: Oral  PainSc: 0-No pain         Complications: No apparent anesthesia complications

## 2018-09-06 NOTE — Anesthesia Postprocedure Evaluation (Signed)
Anesthesia Post Note  Patient: [name] [name]  Procedure(s) Performed: CARDIOVERSION (N/A )     Patient location during evaluation: PACU Anesthesia Type: General Level of consciousness: awake and alert Pain management: pain level controlled Vital Signs Assessment: post-procedure vital signs reviewed and stable Respiratory status: spontaneous breathing, nonlabored ventilation and respiratory function stable Cardiovascular status: blood pressure returned to baseline and stable Postop Assessment: no apparent nausea or vomiting Anesthetic complications: no    Last Vitals:  Vitals:   09/06/18 0750 09/06/18 0842  BP: (!) 157/73 139/65  Pulse: 73 90  Resp: 18 (!) 23  Temp: 36.7 C 36.7 C  SpO2: 99% 99%    Last Pain:  Vitals:   09/06/18 0842  TempSrc: Oral  PainSc: 0-No pain                 Brennan Bailey

## 2018-09-08 ENCOUNTER — Encounter (HOSPITAL_COMMUNITY): Payer: Self-pay | Admitting: Internal Medicine

## 2018-09-13 ENCOUNTER — Telehealth (HOSPITAL_COMMUNITY): Payer: Self-pay | Admitting: Emergency Medicine

## 2018-09-13 ENCOUNTER — Telehealth: Payer: Self-pay | Admitting: Cardiology

## 2018-09-13 NOTE — Telephone Encounter (Signed)
New Message   Pt c/o medication issue:  1. Name of Medication: apixaban (ELIQUIS) 5 MG TABS tablet  2. How are you currently taking this medication (dosage and times per day)?   3. Are you having a reaction (difficulty breathing--STAT)?   4. What is your medication issue? Patient states that the cost of Eliquis is too expensive its $135. Please advise.

## 2018-09-13 NOTE — Telephone Encounter (Signed)
Reaching out to patient to offer assistance regarding upcoming cardiac imaging study; pt verbalizes understanding of appt date/time, parking situation and where to check in, pre-test NPO status and medications ordered, and verified current allergies; name and call back number provided for further questions should they arise Valerya Maxton RN Navigator Cardiac Imaging [phone] 

## 2018-09-13 NOTE — Telephone Encounter (Signed)
Spoke with patient and she has 1 tablet left. Follow up post cardioversion on 09/15/18 with Dr Ellyn Hack. Eliquis 5 mg 1 box lot # [id number] exp 01/2021 at front desk for pick, patient aware. She will discuss further at follow up

## 2018-09-14 ENCOUNTER — Ambulatory Visit (HOSPITAL_COMMUNITY)
Admission: RE | Admit: 2018-09-14 | Discharge: 2018-09-14 | Disposition: A | Discharge status: Home / Self Care | Payer: Medicare Other | Source: Ambulatory Visit | Attending: Cardiology | Admitting: Cardiology

## 2018-09-14 ENCOUNTER — Ambulatory Visit (HOSPITAL_COMMUNITY): Admission: RE | Admit: 2018-09-14 | Payer: Medicare Other | Source: Ambulatory Visit

## 2018-09-14 DIAGNOSIS — R0789 Other chest pain: Secondary | ICD-10-CM | POA: Diagnosis not present

## 2018-09-14 DIAGNOSIS — I4891 Unspecified atrial fibrillation: Secondary | ICD-10-CM | POA: Diagnosis not present

## 2018-09-14 DIAGNOSIS — R079 Chest pain, unspecified: Secondary | ICD-10-CM | POA: Diagnosis not present

## 2018-09-14 HISTORY — PX: OTHER SURGICAL HISTORY: SHX169

## 2018-09-14 MED ORDER — IOPAMIDOL (ISOVUE-370) INJECTION 76%
100.0000 mL | Freq: Once | INTRAVENOUS | Status: AC | PRN
  Administered 2018-09-14: 100 mL via INTRAVENOUS

## 2018-09-14 MED ORDER — NITROGLYCERIN 0.4 MG SL SUBL
SUBLINGUAL_TABLET | SUBLINGUAL | Status: AC
  Filled 2018-09-14: qty 2

## 2018-09-14 MED ORDER — NITROGLYCERIN 0.4 MG SL SUBL
0.8000 mg | SUBLINGUAL_TABLET | Freq: Once | SUBLINGUAL | Status: AC
  Administered 2018-09-14: 0.8 mg via SUBLINGUAL
  Filled 2018-09-14: qty 25

## 2018-09-14 NOTE — Progress Notes (Signed)
Patient denies any complaints. Patient taken to valet parking by wheelchair by this nurse.

## 2018-09-14 NOTE — Progress Notes (Signed)
CT complete. Patient denies any complaints. Patient given snack and beverage.

## 2018-09-15 ENCOUNTER — Encounter: Payer: Self-pay | Admitting: Cardiology

## 2018-09-15 ENCOUNTER — Ambulatory Visit (INDEPENDENT_AMBULATORY_CARE_PROVIDER_SITE_OTHER): Payer: Medicare Other | Admitting: Cardiology

## 2018-09-15 ENCOUNTER — Telehealth: Payer: Self-pay | Admitting: Cardiology

## 2018-09-15 VITALS — BP 122/68 | HR 67 | Ht 64.0 in | Wt 206.0 lb

## 2018-09-15 DIAGNOSIS — E785 Hyperlipidemia, unspecified: Secondary | ICD-10-CM

## 2018-09-15 DIAGNOSIS — I10 Essential (primary) hypertension: Secondary | ICD-10-CM

## 2018-09-15 DIAGNOSIS — I4821 Permanent atrial fibrillation: Secondary | ICD-10-CM | POA: Diagnosis not present

## 2018-09-15 NOTE — Telephone Encounter (Signed)
Patient called states that she called her insurance today and they stated that she had to spend at least $95 now and then her Eliquis would go down back to her $45. She states she will go ahead and pay the $145 now, and will get her medications for the rest of the year $45. She wanted me to let you know.  Thanks!

## 2018-09-15 NOTE — Progress Notes (Signed)
PCP: Plotnikov, Evie Lacks, MD  Clinic Note: Chief Complaint  Patient presents with  . Hospitalization Follow-up    Post cardioversion; converted back to A. fib after initial sinus rhythm  . Atrial Fibrillation  . Coronary Artery Disease    Mild-moderate nonobstructive disease on coronary CTA.    HPI: [name] [name] is a 85 y.o. female with a PMH of recently Dx'd Afib who presents today for post DCCV f/u.  [name] [name] was initially seen on August 04, 2018 for new diagnosis of A. fib and exertional dyspnea following a visit with Dr. Judeen Hammans partner Dr. Scarlette Calico.  New diagnosis of A. fib and systolic murmur heard on EKG. -->  At that time, she noted to me that she felt weak with reduced energy over the last month or so with no chest pain or pressure.  Maybe little of exertional dyspnea.No PND, orthopnea, or edema. --> I started her on Multitak and Xarelto.  We then planned for reassessment and scheduled cardioversion attempt.  She had a coronary CTA and an echocardiogram done. --> She was then seen by Jory Sims, NP on December 30 with complaints of feeling weak and sluggish and wanted to stop Xarelto.  Was eventually converted to Eliquis.  Recent Hospitalizations:   DCCV attempt September 06, 2018  Studies Personally Reviewed - (if available, images/films reviewed: From Epic Chart or Care Everywhere)  TTE 08/11/2018: Mildly reduced EF of 45 to 50% but diffuse HK.  If A. fib the entire time.  Moderate LA and moderate to severe RA dilation (suggesting longstanding A. fib, and potentially explaining difficulty cardioversion).  No significant valve disease.  Aortic sclerosis no stenosis.  Coronary CTA September 14, 2018: Coronary Calcium Score 276.  Mild mixed plaque in the left main with minimal stenosis.  Mixed plaque in the ostial LAD, proximal LCx and moderate size OM1, as well as proximal RCA (<50% stenosis throughout.).  Nonobstructive CAD.  Intermediate risk  DCCV  attempt September 06, 2018 (Dr. Harrington Challenger): Initial success with cardioversion and then reversion back to atrial fibrillation.  Multaq discontinued.  Plan for rate control.  Interval History: [name] [name] returns here today for follow-up after her cardioversion attempt somewhat resolved to the fact that she is going to be in A. fib.  She has subsequently stopped her Multaq and is been switched from Xarelto to apixaban.  Her heart rate seems stable and she has not noticed any rapid heart rates.  She still has a little of exertional dyspnea, but no PND orthopnea.  No chest pain or pressure with rest or exertion.  She tells me that really few things back her energy level has been down going on for almost a year now.  It just came to ahead at the end of the year where she was started to get short of breath a little bit with it.  It would make sense that probably she began having A. fib earlier than expected (especially looking at her echo).  She does have some mild edema.  Despite being in A. fib, she really denies any sensation of irregular heartbeats.  Certainly no rapid heartbeats.  No syncope/near syncope or TIA/amaurosis fugax.  No melena, hematochezia, hematuria, or epstaxis. No claudication.  ROS: A comprehensive was performed. Review of Systems  Constitutional: Positive for malaise/fatigue (Overall reduced energy compared to last year, but not appreciable drop of a significant amount at 1 time.). Negative for chills and fever.  HENT: Negative for congestion.   Respiratory: Negative  for cough, shortness of breath (Only if she overexerts) and wheezing.   Cardiovascular:       See HPI  Gastrointestinal: Negative for abdominal pain and heartburn.  Musculoskeletal: Negative for falls, joint pain and myalgias.  Neurological: Positive for dizziness (If she stands up quickly). Negative for focal weakness, seizures and weakness.  Endo/Heme/Allergies: Does not bruise/bleed easily.  Psychiatric/Behavioral: Negative  for depression and memory loss. The patient is not nervous/anxious and does not have insomnia.   All other systems reviewed and are negative.  I have reviewed and (if needed) personally updated the patient's problem list, medications, allergies, past medical and surgical history, social and family history.   Past Medical History:  Diagnosis Date  . Acute lymphadenitis of arm   . Anemia    iron deficiency  . Cancer (HCC)    breast, skin  . Depression 2009   grief  . Diverticulosis of colon 2004   Dr Deatra Ina  . GERD (gastroesophageal reflux disease)   . Heart murmur   . Hematuria    Dr Terance Hart  . Hyperlipidemia   . Hypertension   . Osteoarthritis, knee   . Vitamin B12 deficiency   . Vitamin D deficiency     Past Surgical History:  Procedure Laterality Date  . ABDOMINAL HYSTERECTOMY    . CARDIOVERSION N/A 09/06/2018   Procedure: CARDIOVERSION;  Surgeon: Fay Records, MD;  Location: [facility] ENDOSCOPY;  Service: Cardiovascular;  Laterality: N/A; --> after initial success with sinus rhythm, the patient went back into A. fib within 2 minutes.  . CORONARY CT ANGIOGRAM  09/14/2018   Coronary Calcium Score 276.  Mild mixed plaque in the left main with minimal stenosis.  Mixed plaque in the ostial LAD, proximal LCx and moderate size OM1, as well as proximal RCA (<50% stenosis throughout.).  Nonobstructive CAD.  Intermediate risk  . JOINT REPLACEMENT    . L groin skin cancer  2011  . MASTECTOMY  09-13-08   left and right  . TOTAL KNEE ARTHROPLASTY  06-04-98  . TOTAL KNEE ARTHROPLASTY Right 05/24/2014   Procedure: RIGHT TOTAL KNEE ARTHROPLASTY;  Surgeon: Newt Minion, MD;  Location: [place];  Service: Orthopedics;  Laterality: Right;  . TRANSTHORACIC ECHOCARDIOGRAM  08/11/2018   Persistent A. fib: Mildly reduced EF of 45 to 50% but diffuse HK.  If A. fib the entire time.  Moderate LA and moderate to severe RA dilation (suggesting longstanding A. fib, and potentially explaining difficulty  cardioversion).  No significant valve disease.  Aortic sclerosis no stenosis.  Unable to assess diastolic function with A. fib    Current Meds  Medication Sig  . acetaminophen (TYLENOL) 500 MG tablet Take 500 mg by mouth 2 (two) times daily as needed for moderate pain.   Marland Kitchen amLODipine (NORVASC) 10 MG tablet Take 1 tablet (10 mg total) by mouth daily.  . [employer] B-12 500 MCG SUBL Place 2 tablets (1,000 mcg total) under the tongue daily.  . diphenhydrAMINE (SOMINEX) 25 MG tablet Take 25 mg by mouth at bedtime.   . hydrocortisone 2.5 % ointment Apply topically 2 (two) times daily. (Patient taking differently: Apply 1 application topically 2 (two) times daily as needed (rash). )  . losartan (COZAAR) 100 MG tablet Take 1 tablet (100 mg total) by mouth daily.  . Multiple Vitamin (MULTIVITAMIN WITH MINERALS) TABS tablet Take 1 tablet by mouth daily.  . potassium chloride SA (KLOR-CON M20) 20 MEQ tablet Take 1 tablet (20 mEq total) by mouth 2 (two)  times daily.  . pravastatin (PRAVACHOL) 20 MG tablet Take 1 tablet (20 mg total) by mouth daily.  Marland Kitchen triamcinolone cream (KENALOG) 0.5 % Apply 1 application topically 4 (four) times daily. On rash    Allergies  Allergen Reactions  . Anastrozole     arthralgias  . Aspirin Other (See Comments)    Gi upset   . Ciprofloxacin Itching  . Letrozole     leg pain    Social History   Tobacco Use  . Smoking status: Never Smoker  . Smokeless tobacco: Never Used  Substance Use Topics  . Alcohol use: No  . Drug use: No   Social History   Social History Narrative  . Not on file    family history includes Atrial fibrillation in an other family member; Cancer in her maternal aunt, sister, and another family member; Coronary artery disease in an other family member.  Wt Readings from Last 3 Encounters:  09/15/18 206 lb (93.4 kg)  09/06/18 204 lb 3.2 oz (92.6 kg)  08/30/18 204 lb 3.2 oz (92.6 kg)    PHYSICAL EXAM BP 122/68   Pulse 67   Ht 5\' 4"  (1.626  m)   Wt 206 lb (93.4 kg)   BMI 35.36 kg/m  Physical Exam  Constitutional: She is oriented to person, place, and time. She appears well-developed and well-nourished. No distress.  Healthy-appearing.  Well-groomed.  Mild to moderately obese.  Looks younger than the stated age.  HENT:  Head: Normocephalic and atraumatic.  Neck: Normal range of motion. Neck supple. No JVD (Mild "cannon A waves ".  But no dilated JVD) present. No thyromegaly present.  Cardiovascular: Normal rate, S1 normal, S2 normal and normal pulses. An irregularly irregular rhythm present.  No extrasystoles are present. PMI is not displaced (Unable to palpate). Exam reveals distant heart sounds. Exam reveals no friction rub and no decreased pulses.  Murmur heard.  Medium-pitched harsh crescendo-decrescendo early systolic murmur is present with a grade of 1/6 at the upper right sternal border. Pulmonary/Chest: Effort normal and breath sounds normal. No respiratory distress. She has no wheezes.  Abdominal: Soft. Bowel sounds are normal. She exhibits no distension. There is no abdominal tenderness. There is no rebound.  Musculoskeletal: Normal range of motion.        General: No edema (Trivial puffy edema bilaterally.).  Neurological: She is alert and oriented to person, place, and time. No cranial nerve deficit.  Psychiatric: She has a normal mood and affect. Her behavior is normal. Judgment and thought content normal.  Vitals reviewed.    Adult ECG Report  Rate: 67 ;  Rhythm: atrial fibrillation, premature ventricular contractions (PVC) and Controlled rate.  LBBB pattern.  Otherwise normal intervals durations.;   Narrative Interpretation: Stable EKG.   Other studies Reviewed: Additional studies/ records that were reviewed today include:  Recent Labs:   Lab Results  Component Value Date   CREATININE 1.06 (H) 08/30/2018   BUN 12 08/30/2018   NA 143 08/30/2018   K 4.0 08/30/2018   CL 101 08/30/2018   CO2 26 08/30/2018     CBC Latest Ref Rng & Units 08/30/2018 07/28/2018 04/21/2018  WBC 3.4 - 10.8 x10E3/uL 5.0 8.9 5.1  Hemoglobin 11.1 - 15.9 g/dL 12.5 13.1 12.5  Hematocrit 34.0 - 46.6 % 38.3 39.5 37.8  Platelets 150 - 450 x10E3/uL 261 257.0 295.0   Lab Results  Component Value Date   CHOL 229 (H) 04/21/2018   HDL 71.80 04/21/2018   LDLCALC 142 (  H) 04/21/2018   TRIG 75.0 04/21/2018   CHOLHDL 3 04/21/2018    ASSESSMENT / PLAN: Problem List Items Addressed This Visit    Essential hypertension (Chronic)    Stable on losartan and amlodipine.  No change      Hyperlipidemia with target LDL less than 100 (Chronic)    She does have coronary artery calcification and moderate coronary disease on coronary CT scan.  Is on Pravachol with labs monitored by PCP.  Would try to target LDL less than 100 if possible.      Permanent atrial fibrillation - Primary (Chronic)    Pretty much appears to be a chronic condition with dilated atria bilaterally.  Very unlikely they will be a maintain sinus rhythm.  Unsuccessful attempted cardioversion to maintain sinus rhythm with Multaq. I agree with stopping Multaq. We talked about referral to the A. fib clinic to discuss other antiarrhythmic such as amiodarone, Tikosyn or flecainide etc.  She indicated that she not overly symptomatic with the A. fib and would prefer not to be on more medications. She is pretty much rate controlled and therefore we do not need anything as far as AV nodal agents such as beta-blocker or calcium channel blockers.  Doing okay on Eliquis, but needs financial counseling assistance.      Relevant Orders   EKG 12-Lead (Completed)      I spent a total of 15-31minutes with the patient and chart review. >  50% of the time was spent in direct patient consultation.   Current medicines are reviewed at length with the patient today.  (+/- concerns)  -financial concerns about Eliquis.(will provide information about financial assistance as warfarin is a  less than favorable option). The following changes have been made:  No changes  Patient Instructions  Medication Instructions:  Your physician recommends that you continue on your current medications as directed. Please refer to the Current Medication list given to you today.  If you need a refill on your cardiac medications before your next appointment, please call your pharmacy.   Lab work: NONE  If you have labs (blood work) drawn today and your tests are completely normal, you will receive your results only by: Marland Kitchen MyChart Message (if you have MyChart) OR . A paper copy in the mail If you have any lab test that is abnormal or we need to change your treatment, we will call you to review the results.  Testing/Procedures: NONE  Follow-Up: At [facility], you and your health needs are our priority.  As part of our continuing mission to provide you with exceptional heart care, we have created designated Provider Care Teams.  These Care Teams include your primary Cardiologist (physician) and Advanced Practice Providers (APPs -  Physician Assistants and Nurse Practitioners) who all work together to provide you with the care you need, when you need it. You will need a follow up appointment in 6 months(July 2020).  Please call our office 2 months (Mid April 2020) in advance to schedule this appointment.  You may see Glenetta Hew, MD or one of the following Advanced Practice Providers on your designated Care Team:   Rosaria Ferries, PA-C . Jory Sims, DNP, ANP  Any Other Special Instructions Will Be Listed Below (If Applicable).       Studies Ordered:   Orders Placed This Encounter  Procedures  . EKG 12-Lead      Glenetta Hew, M.D., M.S. Interventional Cardiologist   Pager # [phone] Phone # [phone] [address]  [place], [place] [address]   Thank you for choosing Heartcare at [facility]!!

## 2018-09-15 NOTE — Patient Instructions (Addendum)
Medication Instructions:  Your physician recommends that you continue on your current medications as directed. Please refer to the Current Medication list given to you today.  If you need a refill on your cardiac medications before your next appointment, please call your pharmacy.   Lab work: NONE  If you have labs (blood work) drawn today and your tests are completely normal, you will receive your results only by: Marland Kitchen MyChart Message (if you have MyChart) OR . A paper copy in the mail If you have any lab test that is abnormal or we need to change your treatment, we will call you to review the results.  Testing/Procedures: NONE  Follow-Up: At [facility], you and your health needs are our priority.  As part of our continuing mission to provide you with exceptional heart care, we have created designated Provider Care Teams.  These Care Teams include your primary Cardiologist (physician) and Advanced Practice Providers (APPs -  Physician Assistants and Nurse Practitioners) who all work together to provide you with the care you need, when you need it. You will need a follow up appointment in 6 months(July 2020).  Please call our office 2 months (Mid April 2020) in advance to schedule this appointment.  You may see Glenetta Hew, MD or one of the following Advanced Practice Providers on your designated Care Team:   Rosaria Ferries, PA-C . Jory Sims, DNP, ANP  Any Other Special Instructions Will Be Listed Below (If Applicable).

## 2018-09-15 NOTE — Telephone Encounter (Signed)
New Message:    Please call, says she needs to talk to you about her Eliquis.

## 2018-09-16 NOTE — Telephone Encounter (Signed)
THANKS , PATIENT AT PRESENT TIME WILL NOT NEED PATIENT ASSISTANCE WITH MEDICATION

## 2018-09-18 ENCOUNTER — Encounter: Payer: Self-pay | Admitting: Cardiology

## 2018-09-18 NOTE — Assessment & Plan Note (Signed)
Stable on losartan and amlodipine.  No change

## 2018-09-18 NOTE — Assessment & Plan Note (Signed)
Pretty much appears to be a chronic condition with dilated atria bilaterally.  Very unlikely they will be a maintain sinus rhythm.  Unsuccessful attempted cardioversion to maintain sinus rhythm with Multaq. I agree with stopping Multaq. We talked about referral to the A. fib clinic to discuss other antiarrhythmic such as amiodarone, Tikosyn or flecainide etc.  She indicated that she not overly symptomatic with the A. fib and would prefer not to be on more medications. She is pretty much rate controlled and therefore we do not need anything as far as AV nodal agents such as beta-blocker or calcium channel blockers.  Doing okay on Eliquis, but needs financial counseling assistance.

## 2018-09-18 NOTE — Assessment & Plan Note (Signed)
She does have coronary artery calcification and moderate coronary disease on coronary CT scan.  Is on Pravachol with labs monitored by PCP.  Would try to target LDL less than 100 if possible.

## 2018-09-28 ENCOUNTER — Ambulatory Visit (INDEPENDENT_AMBULATORY_CARE_PROVIDER_SITE_OTHER): Payer: Medicare Other | Admitting: Internal Medicine

## 2018-09-28 ENCOUNTER — Encounter: Payer: Self-pay | Admitting: Internal Medicine

## 2018-09-28 DIAGNOSIS — E538 Deficiency of other specified B group vitamins: Secondary | ICD-10-CM | POA: Diagnosis not present

## 2018-09-28 DIAGNOSIS — R1310 Dysphagia, unspecified: Secondary | ICD-10-CM

## 2018-09-28 DIAGNOSIS — R634 Abnormal weight loss: Secondary | ICD-10-CM | POA: Diagnosis not present

## 2018-09-28 MED ORDER — PANTOPRAZOLE SODIUM 40 MG PO TBEC: 40.0000 mg | DELAYED_RELEASE_TABLET | Freq: Two times a day (BID) | ORAL | 5 refills | Status: AC

## 2018-09-28 MED ORDER — CYANOCOBALAMIN 1000 MCG/ML IJ SOLN
1000.0000 ug | Freq: Once | INTRAMUSCULAR | Status: AC
  Administered 2018-09-28: 1000 ug via INTRAMUSCULAR

## 2018-09-28 MED ORDER — POTASSIUM CHLORIDE CRYS ER 20 MEQ PO TBCR: 20.0000 meq | EXTENDED_RELEASE_TABLET | Freq: Two times a day (BID) | ORAL | 3 refills | Status: DC

## 2018-09-28 NOTE — Progress Notes (Signed)
Subjective:  Patient ID: [name] [name], female    DOB: [birth year]  Age: 85 y.o. MRN: [id number]  CC: No chief complaint on file.   HPI [name] [name] presents for worsening GERD sx's and trouble swallowing x 2 weeks. No ST, no CP F/u A fib C/o wt loss from 230 lbs Last EGD 2012  Outpatient Medications Prior to Visit  Medication Sig Dispense Refill  . acetaminophen (TYLENOL) 500 MG tablet Take 500 mg by mouth 2 (two) times daily as needed for moderate pain.     Marland Kitchen amLODipine (NORVASC) 10 MG tablet Take 1 tablet (10 mg total) by mouth daily. 90 tablet 3  . apixaban (ELIQUIS) 5 MG TABS tablet Take 1 tablet (5 mg total) by mouth 2 (two) times daily. 60 tablet 1  . [facility] B-12 500 MCG SUBL Place 2 tablets (1,000 mcg total) under the tongue daily. 200 tablet 3  . diphenhydrAMINE (SOMINEX) 25 MG tablet Take 25 mg by mouth at bedtime.     . hydrocortisone 2.5 % ointment Apply topically 2 (two) times daily. (Patient taking differently: Apply 1 application topically 2 (two) times daily as needed (rash). ) 30 g 3  . losartan (COZAAR) 100 MG tablet Take 1 tablet (100 mg total) by mouth daily. 90 tablet 3  . Multiple Vitamin (MULTIVITAMIN WITH MINERALS) TABS tablet Take 1 tablet by mouth daily.    . potassium chloride SA (KLOR-CON M20) 20 MEQ tablet Take 1 tablet (20 mEq total) by mouth 2 (two) times daily. 180 tablet 3  . pravastatin (PRAVACHOL) 20 MG tablet Take 1 tablet (20 mg total) by mouth daily. 90 tablet 3  . triamcinolone cream (KENALOG) 0.5 % Apply 1 application topically 4 (four) times daily. On rash 60 g 1   No facility-administered medications prior to visit.     ROS: Review of Systems  Constitutional: Positive for fatigue and unexpected weight change. Negative for activity change, appetite change and chills.  HENT: Negative for congestion, mouth sores and sinus pressure.   Eyes: Negative for visual disturbance.  Respiratory: Negative for cough and chest tightness.     Cardiovascular: Positive for palpitations. Negative for chest pain.  Gastrointestinal: Negative for abdominal distention, abdominal pain, anal bleeding, constipation, diarrhea and nausea.  Genitourinary: Negative for difficulty urinating, frequency and vaginal pain.  Musculoskeletal: Negative for back pain and gait problem.  Skin: Negative for pallor and rash.  Neurological: Negative for dizziness, tremors, weakness, numbness and headaches.  Psychiatric/Behavioral: Negative for confusion and sleep disturbance.    Objective:  BP 132/82 (BP Location: Right Arm, Patient Position: Sitting, Cuff Size: Normal)   Pulse 76   Temp 98.5 F (36.9 C) (Oral)   Ht 5\' 4"  (1.626 m)   Wt 203 lb (92.1 kg)   SpO2 96%   BMI 34.84 kg/m   BP Readings from Last 3 Encounters:  09/28/18 132/82  09/15/18 122/68  09/14/18 123/76    Wt Readings from Last 3 Encounters:  09/28/18 203 lb (92.1 kg)  09/15/18 206 lb (93.4 kg)  09/06/18 204 lb 3.2 oz (92.6 kg)    Physical Exam Constitutional:      General: She is not in acute distress.    Appearance: She is well-developed.  HENT:     Head: Normocephalic.     Right Ear: External ear normal.     Left Ear: External ear normal.     Nose: Nose normal.  Eyes:     General:  Right eye: No discharge.        Left eye: No discharge.     Conjunctiva/sclera: Conjunctivae normal.     Pupils: Pupils are equal, round, and reactive to light.  Neck:     Musculoskeletal: Normal range of motion and neck supple.     Thyroid: No thyromegaly.     Vascular: No JVD.     Trachea: No tracheal deviation.  Cardiovascular:     Rate and Rhythm: Normal rate and regular rhythm.     Heart sounds: Normal heart sounds.  Pulmonary:     Effort: No respiratory distress.     Breath sounds: No stridor. No wheezing.  Abdominal:     General: Bowel sounds are normal. There is no distension.     Palpations: Abdomen is soft. There is no mass.     Tenderness: There is no  abdominal tenderness. There is no guarding or rebound.  Musculoskeletal:        General: No tenderness.  Lymphadenopathy:     Cervical: No cervical adenopathy.  Skin:    Findings: No erythema or rash.  Neurological:     Cranial Nerves: No cranial nerve deficit.     Motor: No abnormal muscle tone.     Coordination: Coordination normal.     Deep Tendon Reflexes: Reflexes normal.  Psychiatric:        Behavior: Behavior normal.        Thought Content: Thought content normal.        Judgment: Judgment normal.   cane  Lab Results  Component Value Date   WBC 5.0 08/30/2018   HGB 12.5 08/30/2018   HCT 38.3 08/30/2018   PLT 261 08/30/2018   GLUCOSE 90 08/30/2018   CHOL 229 (H) 04/21/2018   TRIG 75.0 04/21/2018   HDL 71.80 04/21/2018   LDLCALC 142 (H) 04/21/2018   ALT 18 07/28/2018   AST 11 07/28/2018   NA 143 08/30/2018   K 4.0 08/30/2018   CL 101 08/30/2018   CREATININE 1.06 (H) 08/30/2018   BUN 12 08/30/2018   CO2 26 08/30/2018   TSH 1.74 07/28/2018   INR 1.67 (H) 05/27/2014   HGBA1C 5.6 12/29/2014    Ct Coronary Morph W/cta Cor W/score W/ca W/cm &/or Wo/cm  Addendum Date: 09/14/2018   ADDENDUM REPORT: 09/14/2018 17:05 CLINICAL DATA:  Chest pain EXAM: Cardiac CTA MEDICATIONS: Sub lingual nitro. 4mg  x 2 TECHNIQUE: The patient was scanned on a Siemens 284 slice scanner. Gantry rotation speed was 250 msecs. Collimation was 0.6 mm. A 100 kV prospective scan was triggered in the ascending thoracic aorta at 35-75% of the R-R interval. Average HR during the scan was 60 bpm. The 3D data set was interpreted on a dedicated work station using MPR, MIP and VRT modes. A total of 80cc of contrast was used. FINDINGS: Non-cardiac: See separate report from [facility]. Pulmonary veins drain normally to the left atrium. Calcium Score: 276 Agatston units. Coronary Arteries: Right dominant with no anomalies LM: Mixed plaque, minimal stenosis distal LM. LAD system: Mixed plaque ostial LAD  with mild (<50%) stenosis. Circumflex system: Moderate OM1. There is mixed plaque with mild (<50%) stenosis in the proximal LCx. There is mixed plaque with mild (<50%) stenosis in the proximal OM1. RCA system: Calcified plaque in the proximal RCA with mild (<50%) stenosis. IMPRESSION: 1. Coronary artery calcium score 276 Agatston units, placing the patient in the 63rd percentile for age and gender, suggesting intermediate risk for future cardiac events. 2.  Nonobstructive coronary disease. Dalton Mclean Electronically Signed   By: Loralie Champagne M.D.   On: 09/14/2018 17:05   Result Date: 09/14/2018 EXAM: OVER-READ INTERPRETATION  CT CHEST The following report is an over-read performed by radiologist Dr. Rolm Baptise of [facility], [place] on 09/14/2018. This over-read does not include interpretation of cardiac or coronary anatomy or pathology. The coronary CTA interpretation by the cardiologist is attached. COMPARISON:  None. FINDINGS: Vascular: Heart is borderline in size. Irregular plaque in the descending thoracic aorta. No aneurysm. Mediastinum/Nodes: No adenopathy in the lower mediastinum or hila. Lungs/Pleura: No confluent opacities or effusions. Upper Abdomen: Imaging into the upper abdomen shows no acute findings. Musculoskeletal: Prior bilateral mastectomies. No acute findings. No acute bony abnormality. IMPRESSION: Borderline heart size. Irregular calcified and noncalcified plaque in the descending thoracic aorta. No acute findings. Electronically Signed: By: Rolm Baptise M.D. On: 09/14/2018 12:13    Assessment & Plan:   There are no diagnoses linked to this encounter.   No orders of the defined types were placed in this encounter.    Follow-up: No follow-ups on file.  Walker Kehr, MD

## 2018-09-28 NOTE — Assessment & Plan Note (Signed)
Discussed: pt used to be 230 lbs

## 2018-09-28 NOTE — Assessment & Plan Note (Signed)
B12 inj 

## 2018-09-28 NOTE — Assessment & Plan Note (Signed)
1/20 new - ?stricture CTA coronary CT - no cancer in the chest  Start Protonix GI ref

## 2018-09-28 NOTE — Addendum Note (Signed)
Addended by: Karren Cobble on: 09/28/2018 04:34 PM   Modules accepted: Orders

## 2018-10-12 ENCOUNTER — Ambulatory Visit (INDEPENDENT_AMBULATORY_CARE_PROVIDER_SITE_OTHER): Payer: Medicare Other | Admitting: Gastroenterology

## 2018-10-12 ENCOUNTER — Encounter: Payer: Self-pay | Admitting: Gastroenterology

## 2018-10-12 ENCOUNTER — Telehealth: Payer: Self-pay

## 2018-10-12 VITALS — BP 112/80 | HR 64 | Ht 64.0 in | Wt 204.1 lb

## 2018-10-12 DIAGNOSIS — R634 Abnormal weight loss: Secondary | ICD-10-CM

## 2018-10-12 DIAGNOSIS — R1310 Dysphagia, unspecified: Secondary | ICD-10-CM

## 2018-10-12 DIAGNOSIS — Z7901 Long term (current) use of anticoagulants: Secondary | ICD-10-CM | POA: Diagnosis not present

## 2018-10-12 NOTE — Telephone Encounter (Signed)
[facility] Pre-operative Risk Assessment     Request for surgical clearance:     Endoscopy Procedure  What type of surgery is being performed?     EGD  When is this surgery scheduled?     11/03/18  What type of clearance is required ?   Pharmacy  Are there any medications that need to be held prior to surgery and how long? Elqiuis x 2 days  Practice name and name of physician performing surgery?      [facility]  What is your office phone and fax number?      Phone- [phone]  Fax248 [phone]  Anesthesia type (None, local, MAC, general) ?       MAC

## 2018-10-12 NOTE — Progress Notes (Signed)
History of Present Illness: This is an 85 year old female referred by Plotnikov, [name] Lacks, MD for the evaluation of dysphagia.  She relates the onset of solid food dysphagia and dysphagia with all pills following treatment for bronchitis in November.  Her bronchitis resolved completely however dysphagia has persisted.  She has rare episodes of heartburn.  She was placed on pantoprazole with no significant change in symptoms.  She relates a mild gradual weight loss over the past year. Denies abdominal pain, constipation, diarrhea, change in stool caliber, melena, hematochezia, nausea, vomiting, chest pain and pain with swallowing.  Abd [place] 03/2018 IMPRESSION: 1. Cholelithiasis and sludge in gallbladder. No gallbladder wall thickening or pericholecystic fluid.  2. Limited visualization of spleen due to body habitus. Visualized portions of spleen appear normal.  3. Mild fullness of the left renal collecting system without focus of obstruction evident by ultrasound.  4.  Aortic atherosclerosis  EGD 09/2010 for abdominal pain. Gastritis, sessile gastric polyp. Path H pylori gastritis.   Colonoscopy 08/2003: Normal     Allergies  Allergen Reactions  . Anastrozole     arthralgias  . Aspirin Other (See Comments)    Gi upset   . Ciprofloxacin Itching  . Letrozole     leg pain   Outpatient Medications Prior to Visit  Medication Sig Dispense Refill  . acetaminophen (TYLENOL) 500 MG tablet Take 500 mg by mouth 2 (two) times daily as needed for moderate pain.     Marland Kitchen amLODipine (NORVASC) 10 MG tablet Take 1 tablet (10 mg total) by mouth daily. 90 tablet 3  . apixaban (ELIQUIS) 5 MG TABS tablet Take 1 tablet (5 mg total) by mouth 2 (two) times daily. 60 tablet 1  . [facility] B-12 500 MCG SUBL Place 2 tablets (1,000 mcg total) under the tongue daily. 200 tablet 3  . diphenhydrAMINE (SOMINEX) 25 MG tablet Take 25 mg by mouth at bedtime.     . hydrocortisone 2.5 % ointment Apply topically 2 (two)  times daily. (Patient taking differently: Apply 1 application topically 2 (two) times daily as needed (rash). ) 30 g 3  . losartan (COZAAR) 100 MG tablet Take 1 tablet (100 mg total) by mouth daily. 90 tablet 3  . Multiple Vitamin (MULTIVITAMIN WITH MINERALS) TABS tablet Take 1 tablet by mouth daily.    . pantoprazole (PROTONIX) 40 MG tablet Take 1 tablet (40 mg total) by mouth 2 (two) times daily. 60 tablet 5  . potassium chloride SA (KLOR-CON M20) 20 MEQ tablet Take 1 tablet (20 mEq total) by mouth 2 (two) times daily. 180 tablet 3  . pravastatin (PRAVACHOL) 20 MG tablet Take 1 tablet (20 mg total) by mouth daily. 90 tablet 3  . triamcinolone cream (KENALOG) 0.5 % Apply 1 application topically 4 (four) times daily. On rash 60 g 1   No facility-administered medications prior to visit.    Past Medical History:  Diagnosis Date  . Acute lymphadenitis of arm   . Anemia    iron deficiency  . Atrial fibrillation ([place])   . Breast cancer ([place])   . Depression 2009   grief  . Diverticulosis of colon 2004   Dr Deatra Ina  . GERD (gastroesophageal reflux disease)   . Heart murmur   . Helicobacter pylori gastritis   . Hematuria    Dr Terance Hart  . Hyperlipidemia   . Hypertension   . Osteoarthritis, knee   . Skin cancer   . Vitamin B12 deficiency   .  Vitamin D deficiency    Past Surgical History:  Procedure Laterality Date  . ABDOMINAL HYSTERECTOMY    . CARDIOVERSION N/A 09/06/2018   Procedure: CARDIOVERSION;  Surgeon: Fay Records, MD;  Location[facility] ENDOSCOPY;  Service: Cardiovascular;  Laterality: N/A; --> after initial success with sinus rhythm, the patient went back into A. fib within 2 minutes.  . CORONARY CT ANGIOGRAM  09/14/2018   Coronary Calcium Score 276.  Mild mixed plaque in the left main with minimal stenosis.  Mixed plaque in the ostial LAD, proximal LCx and moderate size OM1, as well as proximal RCA (<50% stenosis throughout.).  Nonobstructive CAD.  Intermediate risk  . JOINT  REPLACEMENT    . L groin skin cancer  2011  . MASTECTOMY  09-13-08   left and right  . TOTAL KNEE ARTHROPLASTY  06-04-98  . TOTAL KNEE ARTHROPLASTY Right 05/24/2014   Procedure: RIGHT TOTAL KNEE ARTHROPLASTY;  Surgeon: Newt Minion, MD;  Location: [place];  Service: Orthopedics;  Laterality: Right;  . TRANSTHORACIC ECHOCARDIOGRAM  08/11/2018   Persistent A. fib: Mildly reduced EF of 45 to 50% but diffuse HK.  If A. fib the entire time.  Moderate LA and moderate to severe RA dilation (suggesting longstanding A. fib, and potentially explaining difficulty cardioversion).  No significant valve disease.  Aortic sclerosis no stenosis.  Unable to assess diastolic function with A. fib   Social History   Socioeconomic History  . Marital status: Widowed    Spouse name: Not on file  . Number of children: 7  . Years of education: Not on file  . Highest education level: Not on file  Occupational History  . Not on file  Social Needs  . Financial resource strain: Not hard at all  . Food insecurity:    Worry: Never true    Inability: Never true  . Transportation needs:    Medical: No    Non-medical: No  Tobacco Use  . Smoking status: Never Smoker  . Smokeless tobacco: Never Used  Substance and Sexual Activity  . Alcohol use: No  . Drug use: No  . Sexual activity: Never  Lifestyle  . Physical activity:    Days per week: 3 days    Minutes per session: 50 min  . Stress: Not at all  Relationships  . Social connections:    Talks on phone: More than three times a week    Gets together: More than three times a week    Attends religious service: More than 4 times per year    Active member of club or organization: Yes    Attends meetings of clubs or organizations: More than 4 times per year    Relationship status: Widowed  Other Topics Concern  . Not on file  Social History Narrative  . Not on file   Family History  Problem Relation Age of Onset  . Cancer Sister        breast  . Cancer  Maternal Aunt        breast  . Coronary artery disease Other   . Cancer Other        aunt, niece  . Atrial fibrillation Other       Review of Systems: Pertinent positive and negative review of systems were noted in the above HPI section. All other review of systems were otherwise negative.    Physical Exam: General: Well developed, well nourished, no acute distress Head: Normocephalic and atraumatic Eyes:  sclerae anicteric, EOMI Ears:  Normal auditory acuity Mouth: No deformity or lesions Neck: Supple, no masses or thyromegaly Lungs: Clear throughout to auscultation Heart: Regular rate and rhythm; no murmurs, rubs or bruits Abdomen: Soft, non tender and non distended. No masses, hepatosplenomegaly or hernias noted. Normal Bowel sounds Rectal: Not done Musculoskeletal: Symmetrical with no gross deformities  Skin: No lesions on visible extremities Pulses:  Normal pulses noted Extremities: No clubbing, cyanosis, edema or deformities noted Neurological: Alert oriented x 4, grossly nonfocal Cervical Nodes:  No significant cervical adenopathy Inguinal Nodes: No significant inguinal adenopathy Psychological:  Alert and cooperative. Normal mood and affect   Assessment and Recommendations:  1. Dysphagia.  Rule out esophageal stricture, esophagitis, less likely neoplasm.  Continue pantoprazole 40 mg daily.  Schedule barium esophagram.  Schedule EGD with possible dilation. The risks (including bleeding, perforation, infection, missed lesions, medication reactions and possible hospitalization or surgery if complications occur), benefits, and alternatives to endoscopy with possible biopsy and possible dilation were discussed with the patient and they consent to proceed.   2. Afib on Eliquis. Hold Eliquis 2 days before procedure - will instruct when and how to resume after procedure. Low but real risk of cardiovascular event such as heart attack, stroke, embolism, thrombosis or  ischemia/infarct of other organs off Eliquis explained and need to seek urgent help if this occurs. The patient consents to proceed. Will communicate by phone or EMR with patient's prescribing provider to confirm that holding Eliquis is reasonable in this case.   3. Gradual weight loss over the past year. EGD as above.   4. Cholelithiasis, asymptomatic.   5.  History of H. pylori gastritis in 2012.   cc: Plotnikov, [name] Lacks, MD [address] [place], [place] [address]

## 2018-10-12 NOTE — Patient Instructions (Addendum)
You have been scheduled for a Barium Esophogram at [facility] (1st floor of the hospital) on 10/19/18 at 9:30am. Please arrive 15 minutes prior to your appointment for registration. Make certain not to have anything to eat or drink 3 hours prior to your test. If you need to reschedule for any reason, please contact radiology at [phone] to do so. __________________________________________________________________ A barium swallow is an examination that concentrates on views of the esophagus. This tends to be a double contrast exam (barium and two liquids which, when combined, create a gas to distend the wall of the oesophagus) or single contrast (non-ionic iodine based). The study is usually tailored to your symptoms so a good history is essential. Attention is paid during the study to the form, structure and configuration of the esophagus, looking for functional disorders (such as aspiration, dysphagia, achalasia, motility and reflux) EXAMINATION You may be asked to change into a gown, depending on the type of swallow being performed. A radiologist and radiographer will perform the procedure. The radiologist will advise you of the type of contrast selected for your procedure and direct you during the exam. You will be asked to stand, sit or lie in several different positions and to hold a small amount of fluid in your mouth before being asked to swallow while the imaging is performed .In some instances you may be asked to swallow barium coated marshmallows to assess the motility of a solid food bolus. The exam can be recorded as a digital or video fluoroscopy procedure. POST PROCEDURE It will take 1-2 days for the barium to pass through your system. To facilitate this, it is important, unless otherwise directed, to increase your fluids for the next 24-48hrs and to resume your normal diet.  This test typically takes about 30 minutes to  perform. __________________________________________________________________  [name] [name] have been scheduled for an endoscopy. Please follow written instructions given to you at your visit today. If you use inhalers (even only as needed), please bring them with you on the day of your procedure. Your physician has requested that you go to www.startemmi.com and enter the access code given to you at your visit today. This web site gives a general overview about your procedure. However, you should still follow specific instructions given to you by our office regarding your preparation for the procedure.  Thank you for choosing me and [facility].  Pricilla Riffle. Dagoberto Ligas., MD., [facility] Regal

## 2018-10-13 NOTE — Telephone Encounter (Signed)
Patient informed to hold Eliquis 2 days prior to her procedure per Cardiology. Patient verbalized understanding.

## 2018-10-13 NOTE — Telephone Encounter (Signed)
Patient with diagnosis of afib on Eliquis for anticoagulation.    Procedure: EGD Date of procedure: 11/03/2018  CHADS2-VASc score of  5 (CHF, HTN, AGE, DM2, stroke/tia x 2, CAD, AGE, female)  [place] for patient to hold Eliquis for 2 days prior to procedure.

## 2018-10-19 ENCOUNTER — Ambulatory Visit (HOSPITAL_COMMUNITY)
Admission: RE | Admit: 2018-10-19 | Discharge: 2018-10-19 | Disposition: A | Discharge status: Home / Self Care | Payer: Medicare Other | Source: Ambulatory Visit | Attending: Gastroenterology | Admitting: Gastroenterology

## 2018-10-19 DIAGNOSIS — R1310 Dysphagia, unspecified: Secondary | ICD-10-CM | POA: Insufficient documentation

## 2018-10-20 ENCOUNTER — Other Ambulatory Visit: Payer: Self-pay

## 2018-10-20 DIAGNOSIS — R1314 Dysphagia, pharyngoesophageal phase: Secondary | ICD-10-CM

## 2018-10-20 NOTE — Progress Notes (Signed)
slp speech 

## 2018-10-22 ENCOUNTER — Other Ambulatory Visit (HOSPITAL_COMMUNITY): Payer: Self-pay

## 2018-10-22 DIAGNOSIS — R1310 Dysphagia, unspecified: Secondary | ICD-10-CM

## 2018-10-26 ENCOUNTER — Telehealth: Payer: Self-pay | Admitting: Cardiology

## 2018-10-26 ENCOUNTER — Other Ambulatory Visit: Payer: Self-pay | Admitting: Cardiology

## 2018-10-26 MED ORDER — APIXABAN 5 MG PO TABS: 5.0000 mg | ORAL_TABLET | Freq: Two times a day (BID) | ORAL | 3 refills | Status: DC

## 2018-10-26 NOTE — Telephone Encounter (Signed)
RX sent in for pts Eliquis per her request.

## 2018-10-26 NOTE — Telephone Encounter (Signed)
°*  STAT* If patient is at the pharmacy, call can be transferred to refill team.   1. Which medications need to be refilled? (please list name of each medication and dose if known) Eliquis 5 mg  2. Which pharmacy/location (including street and city if local pharmacy) is medication to be sent to? [facility]  3. Do they need a 30 day or 90 day supply? [place]

## 2018-11-01 ENCOUNTER — Ambulatory Visit: Payer: Self-pay | Admitting: Pharmacist

## 2018-11-03 ENCOUNTER — Ambulatory Visit (AMBULATORY_SURGERY_CENTER): Payer: Medicare Other | Admitting: Gastroenterology

## 2018-11-03 ENCOUNTER — Other Ambulatory Visit: Payer: Self-pay

## 2018-11-03 ENCOUNTER — Encounter: Payer: Self-pay | Admitting: Gastroenterology

## 2018-11-03 VITALS — BP 148/88 | HR 81 | Temp 98.2°F | Resp 21

## 2018-11-03 DIAGNOSIS — R1310 Dysphagia, unspecified: Secondary | ICD-10-CM | POA: Diagnosis not present

## 2018-11-03 DIAGNOSIS — K319 Disease of stomach and duodenum, unspecified: Secondary | ICD-10-CM

## 2018-11-03 DIAGNOSIS — K449 Diaphragmatic hernia without obstruction or gangrene: Secondary | ICD-10-CM | POA: Diagnosis not present

## 2018-11-03 DIAGNOSIS — B9681 Helicobacter pylori [H. pylori] as the cause of diseases classified elsewhere: Secondary | ICD-10-CM | POA: Diagnosis not present

## 2018-11-03 DIAGNOSIS — K3189 Other diseases of stomach and duodenum: Secondary | ICD-10-CM | POA: Diagnosis not present

## 2018-11-03 DIAGNOSIS — K2950 Unspecified chronic gastritis without bleeding: Secondary | ICD-10-CM | POA: Diagnosis not present

## 2018-11-03 DIAGNOSIS — R933 Abnormal findings on diagnostic imaging of other parts of digestive tract: Secondary | ICD-10-CM

## 2018-11-03 MED ORDER — SODIUM CHLORIDE 0.9 % IV SOLN: 500.0000 mL | Freq: Once | INTRAVENOUS | Status: DC

## 2018-11-03 NOTE — Op Note (Signed)
[place] Patient Name: [name] [name] Procedure Date: 11/03/2018 1:58 PM MRN: [id number] Endoscopist: Ladene Artist , MD Age: 85 Referring MD:  Date of Birth: Jan 18, 1933 Gender: Female Account #: [id number] Procedure:                Upper GI endoscopy Indications:              Dysphagia, Abnormal esophagram Medicines:                Monitored Anesthesia Care Procedure:                Pre-Anesthesia Assessment:                           - Prior to the procedure, a History and Physical                            was performed, and patient medications and                            allergies were reviewed. The patient's tolerance of                            previous anesthesia was also reviewed. The risks                            and benefits of the procedure and the sedation                            options and risks were discussed with the patient.                            All questions were answered, and informed consent                            was obtained. Prior Anticoagulants: The patient has                            taken Eliquis (apixaban), last dose was 2 days                            prior to procedure. ASA Grade Assessment: III - A                            patient with severe systemic disease. After                            reviewing the risks and benefits, the patient was                            deemed in satisfactory condition to undergo the                            procedure.  After obtaining informed consent, the endoscope was                            passed under direct vision. Throughout the                            procedure, the patient's blood pressure, pulse, and                            oxygen saturations were monitored continuously. The                            Endoscope was introduced through the mouth, and                            advanced to the second part of duodenum. The upper                   GI endoscopy was accomplished without difficulty.                            The patient tolerated the procedure well. Scope In: Scope Out: Findings:                 The examined esophagus was normal.                           Patchy mildly erythematous mucosa without bleeding                            was found in the gastric fundus and in the gastric                            body. Biopsies were taken with a cold forceps for                            histology.                           A small hiatal hernia was present.                           The exam of the stomach was otherwise normal.                           The duodenal bulb and second portion of the                            duodenum were normal. Complications:            No immediate complications. Estimated Blood Loss:     Estimated blood loss was minimal. Impression:               - Normal esophagus.                           - Erythematous mucosa in the gastric  fundus and                            gastric body. Biopsied.                           - Small hiatal hernia.                           - Normal duodenal bulb and second portion of the                            duodenum. Recommendation:           - Patient has a contact number available for                            emergencies. The signs and symptoms of potential                            delayed complications were discussed with the                            patient. Return to normal activities tomorrow.                            Written discharge instructions were provided to the                            patient.                           - Resume previous diet.                           - Follow antireflux measures.                           - Continue present medications.                           - Await pathology results.                           - Resume Eliquis (apixaban) at prior dose tomorrow.                             Refer to managing physician for further adjustment                            of therapy. Ladene Artist, MD 11/03/2018 2:20:40 PM This report has been signed electronically.

## 2018-11-03 NOTE — Progress Notes (Signed)
Pt's states no medical or surgical changes since previsit or office visit. 

## 2018-11-03 NOTE — Progress Notes (Signed)
Called to room to assist during endoscopic procedure.  Patient ID and intended procedure confirmed with present staff. Received instructions for my participation in the procedure from the performing physician.  

## 2018-11-03 NOTE — Patient Instructions (Signed)
  Thank you for allowing [place] to care for you today!  Await pathology results by mail, approximately 2 weeks.  Follow anti-reflux measures.  Handout provided for GERD>  Resume EIliquis tomorrow at prior dose.  Resume other medications today, return to your normal activities tomorrow.    YOU HAD AN ENDOSCOPIC PROCEDURE TODAY AT [facility]:   Refer to the procedure report that was given to you for any specific questions about what was found during the examination.  If the procedure report does not answer your questions, please call your gastroenterologist to clarify.  If you requested that your care partner not be given the details of your procedure findings, then the procedure report has been included in a sealed envelope for you to review at your convenience later.  YOU SHOULD EXPECT: Some feelings of bloating in the abdomen. Passage of more gas than usual.  Walking can help get rid of the air that was put into your GI tract during the procedure and reduce the bloating. If you had a lower endoscopy (such as a colonoscopy or flexible sigmoidoscopy) you may notice spotting of blood in your stool or on the toilet paper. If you underwent a bowel prep for your procedure, you may not have a normal bowel movement for a few days.  Please Note:  You might notice some irritation and congestion in your nose or some drainage.  This is from the oxygen used during your procedure.  There is no need for concern and it should clear up in a day or so.  SYMPTOMS TO REPORT IMMEDIATELY:    Following upper endoscopy (EGD)  Vomiting of blood or coffee ground material  New chest pain or pain under the shoulder blades  Painful or persistently difficult swallowing  New shortness of breath  Fever of 100F or higher  Black, tarry-looking stools  For urgent or emergent issues, a gastroenterologist can be reached at any hour by calling [phone].   DIET:  We do recommend a small meal at  first, but then you may proceed to your regular diet.  Drink plenty of fluids but you should avoid alcoholic beverages for 24 hours.  ACTIVITY:  You should plan to take it easy for the rest of today and you should NOT DRIVE or use heavy machinery until tomorrow (because of the sedation medicines used during the test).    FOLLOW UP: Our staff will call the number listed on your records the next business day following your procedure to check on you and address any questions or concerns that you may have regarding the information given to you following your procedure. If we do not reach you, we will leave a message.  However, if you are feeling well and you are not experiencing any problems, there is no need to return our call.  We will assume that you have returned to your regular daily activities without incident.  If any biopsies were taken you will be contacted by phone or by letter within the next 1-3 weeks.  Please call us at [phone] if you have not heard about the biopsies in 3 weeks.    SIGNATURES/CONFIDENTIALITY: You and/or your care partner have signed paperwork which will be entered into your electronic medical record.  These signatures attest to the fact that that the information above on your After Visit Summary has been reviewed and is understood.  Full responsibility of the confidentiality of this discharge information lies with you and/or your care-partner.

## 2018-11-03 NOTE — Progress Notes (Signed)
PT taken to PACU. Monitors in place. VSS. Report given to RN. 

## 2018-11-04 ENCOUNTER — Telehealth: Payer: Self-pay | Admitting: *Deleted

## 2018-11-04 NOTE — Telephone Encounter (Signed)
  Follow up Call-  Call back number 11/03/2018  Post procedure Call Back phone  # [phone]  Permission to leave phone message Yes  Some recent data might be hidden     Patient questions:  Do you have a fever, pain , or abdominal swelling? No. Pain Score  0 *  Have you tolerated food without any problems? Yes.    Have you been able to return to your normal activities? Yes.    Do you have any questions about your discharge instructions: Diet   No. Medications  No. Follow up visit  No.  Do you have questions or concerns about your Care? No.  Actions: * If pain score is 4 or above: No action needed, pain <4.

## 2018-11-05 ENCOUNTER — Ambulatory Visit (HOSPITAL_COMMUNITY)
Admission: RE | Admit: 2018-11-05 | Discharge: 2018-11-05 | Disposition: A | Discharge status: Home / Self Care | Payer: Medicare Other | Source: Ambulatory Visit | Attending: Gastroenterology | Admitting: Gastroenterology

## 2018-11-05 DIAGNOSIS — R1310 Dysphagia, unspecified: Secondary | ICD-10-CM | POA: Diagnosis not present

## 2018-11-05 DIAGNOSIS — T17320A Food in larynx causing asphyxiation, initial encounter: Secondary | ICD-10-CM | POA: Diagnosis not present

## 2018-11-05 DIAGNOSIS — R1314 Dysphagia, pharyngoesophageal phase: Secondary | ICD-10-CM

## 2018-11-05 DIAGNOSIS — T17320D Food in larynx causing asphyxiation, subsequent encounter: Secondary | ICD-10-CM | POA: Diagnosis not present

## 2018-11-05 NOTE — Progress Notes (Signed)
Modified Barium Swallow Progress Note  Patient Details  Name: [name] [name] MRN: [id number] Date of Birth: [birth year]  Today's Date: 11/05/2018  Modified Barium Swallow completed.  Full report located under Chart Review in the Imaging Section.  Brief recommendations include the following:  Clinical Impression  Pt was seen in radiology suite for modified barium swallow study. She was seated upright in swallow chair for the study which was conducted in lateral view. Trials of puree solids, mechanical soft solids, regular texture solids, thin liquids (via straw), and a 48mm barium tablet with thin liquids were administered. Her oropharyngeal swallow mechanism was within normal limits for her age despite a single instance of transient penetration with the initial swallow of thin liquids. No significant findings were noted duing esophageal screening. It is recommended that a regular texture diet with thin liquids be continued and further skilled SLP services are not clinically indicated at this time.     Swallow Evaluation Recommendations       SLP Diet Recommendations: Regular solids;Thin liquid   Liquid Administration via: Cup;Straw   Medication Administration: Whole meds with liquid       Compensations: Slow rate;Small sips/bites   Postural Changes: Seated upright at 90 degrees   Oral Care Recommendations: Oral care BID       Rhylan Kagel I. Hardin Negus, [place], [place] Office number [phone] Pager [phone]  Horton Marshall 11/05/2018,11:51 AM

## 2018-11-08 ENCOUNTER — Ambulatory Visit: Payer: Self-pay | Admitting: Pharmacist

## 2018-11-11 ENCOUNTER — Other Ambulatory Visit (INDEPENDENT_AMBULATORY_CARE_PROVIDER_SITE_OTHER): Payer: Medicare Other

## 2018-11-11 ENCOUNTER — Encounter: Payer: Self-pay | Admitting: Internal Medicine

## 2018-11-11 ENCOUNTER — Other Ambulatory Visit: Payer: Self-pay

## 2018-11-11 ENCOUNTER — Ambulatory Visit (INDEPENDENT_AMBULATORY_CARE_PROVIDER_SITE_OTHER): Payer: Medicare Other | Admitting: Internal Medicine

## 2018-11-11 VITALS — BP 114/66 | HR 73 | Temp 98.2°F | Ht 64.0 in | Wt 201.0 lb

## 2018-11-11 DIAGNOSIS — K219 Gastro-esophageal reflux disease without esophagitis: Secondary | ICD-10-CM | POA: Diagnosis not present

## 2018-11-11 DIAGNOSIS — I10 Essential (primary) hypertension: Secondary | ICD-10-CM | POA: Diagnosis not present

## 2018-11-11 DIAGNOSIS — R1310 Dysphagia, unspecified: Secondary | ICD-10-CM

## 2018-11-11 DIAGNOSIS — E538 Deficiency of other specified B group vitamins: Secondary | ICD-10-CM

## 2018-11-11 LAB — BASIC METABOLIC PANEL
BUN: 15 mg/dL (ref 6–23)
CO2: 31 mEq/L (ref 19–32)
Calcium: 10 mg/dL (ref 8.4–10.5)
Chloride: 100 mEq/L (ref 96–112)
Creatinine, Ser: 1.01 mg/dL (ref 0.40–1.20)
GFR: 63.22 mL/min (ref >60.00)
Glucose, Bld: 93 mg/dL (ref 70–99)
Potassium: 3.7 mEq/L (ref 3.5–5.1)
SODIUM: 139 meq/L (ref 135–145)

## 2018-11-13 ENCOUNTER — Encounter: Payer: Self-pay | Admitting: Internal Medicine

## 2018-11-13 NOTE — Assessment & Plan Note (Signed)
On vitamin B12 

## 2018-11-13 NOTE — Progress Notes (Signed)
Subjective:  Patient ID: [name] [name], female    DOB: 1932/11/09  Age: 85 y.o. MRN: [id number]  CC: No chief complaint on file.   HPI [name] [name] presents for dysphagia, atrial fibrillation, hypertension follow-up  Outpatient Medications Prior to Visit  Medication Sig Dispense Refill  . acetaminophen (TYLENOL) 500 MG tablet Take 500 mg by mouth 2 (two) times daily as needed for moderate pain.     Marland Kitchen amLODipine (NORVASC) 10 MG tablet Take 1 tablet (10 mg total) by mouth daily. 90 tablet 3  . apixaban (ELIQUIS) 5 MG TABS tablet Take 1 tablet (5 mg total) by mouth 2 (two) times daily. 60 tablet 3  . [facility] B-12 500 MCG SUBL Place 2 tablets (1,000 mcg total) under the tongue daily. 200 tablet 3  . diphenhydrAMINE (SOMINEX) 25 MG tablet Take 25 mg by mouth at bedtime.     Marland Kitchen ELIQUIS 5 MG TABS tablet TAKE 1 TABLET BY MOUTH TWICE DAILY 180 tablet 1  . hydrocortisone 2.5 % ointment Apply topically 2 (two) times daily. 30 g 3  . losartan (COZAAR) 100 MG tablet Take 1 tablet (100 mg total) by mouth daily. 90 tablet 3  . Multiple Vitamin (MULTIVITAMIN WITH MINERALS) TABS tablet Take 1 tablet by mouth daily.    . pantoprazole (PROTONIX) 40 MG tablet Take 1 tablet (40 mg total) by mouth 2 (two) times daily. 60 tablet 5  . potassium chloride SA (KLOR-CON M20) 20 MEQ tablet Take 1 tablet (20 mEq total) by mouth 2 (two) times daily. 180 tablet 3  . pravastatin (PRAVACHOL) 20 MG tablet Take 1 tablet (20 mg total) by mouth daily. 90 tablet 3  . triamcinolone cream (KENALOG) 0.5 % Apply 1 application topically 4 (four) times daily. On rash 60 g 1   No facility-administered medications prior to visit.     ROS: Review of Systems  Constitutional: Positive for fatigue. Negative for activity change, appetite change, chills and unexpected weight change.  HENT: Negative for congestion, mouth sores and sinus pressure.   Eyes: Negative for visual disturbance.  Respiratory: Negative for cough and chest  tightness.   Gastrointestinal: Negative for abdominal pain and nausea.  Genitourinary: Negative for difficulty urinating, frequency and vaginal pain.  Musculoskeletal: Positive for arthralgias and gait problem. Negative for back pain.  Skin: Negative for pallor and rash.  Neurological: Negative for dizziness, tremors, weakness, numbness and headaches.  Psychiatric/Behavioral: Negative for confusion and sleep disturbance.    Objective:  BP 114/66   Pulse 73   Temp 98.2 F (36.8 C) (Oral)   Ht 5\' 4"  (1.626 m)   Wt 201 lb (91.2 kg)   SpO2 95%   BMI 34.50 kg/m   BP Readings from Last 3 Encounters:  11/11/18 114/66  11/03/18 (!) 148/88  10/12/18 112/80    Wt Readings from Last 3 Encounters:  11/11/18 201 lb (91.2 kg)  10/12/18 204 lb 2 oz (92.6 kg)  09/28/18 203 lb (92.1 kg)    Physical Exam Constitutional:      General: She is not in acute distress.    Appearance: She is well-developed.  HENT:     Head: Normocephalic.     Right Ear: External ear normal.     Left Ear: External ear normal.     Nose: Nose normal.  Eyes:     General:        Right eye: No discharge.        Left eye: No discharge.  Conjunctiva/sclera: Conjunctivae normal.     Pupils: Pupils are equal, round, and reactive to light.  Neck:     Musculoskeletal: Normal range of motion and neck supple.     Thyroid: No thyromegaly.     Vascular: No JVD.     Trachea: No tracheal deviation.  Cardiovascular:     Rate and Rhythm: Normal rate and regular rhythm.     Heart sounds: Normal heart sounds.  Pulmonary:     Effort: No respiratory distress.     Breath sounds: No stridor. No wheezing.  Abdominal:     General: Bowel sounds are normal. There is no distension.     Palpations: Abdomen is soft. There is no mass.     Tenderness: There is no abdominal tenderness. There is no guarding or rebound.  Musculoskeletal:        General: Tenderness present.  Lymphadenopathy:     Cervical: No cervical adenopathy.   Skin:    Findings: No erythema or rash.  Neurological:     Cranial Nerves: No cranial nerve deficit.     Motor: No abnormal muscle tone.     Coordination: Coordination abnormal.     Deep Tendon Reflexes: Reflexes normal.  Psychiatric:        Behavior: Behavior normal.        Thought Content: Thought content normal.        Judgment: Judgment normal.   Stiff lumbar spine  arthritic gait  Lab Results  Component Value Date   WBC 5.0 08/30/2018   HGB 12.5 08/30/2018   HCT 38.3 08/30/2018   PLT 261 08/30/2018   GLUCOSE 93 11/11/2018   CHOL 229 (H) 04/21/2018   TRIG 75.0 04/21/2018   HDL 71.80 04/21/2018   LDLCALC 142 (H) 04/21/2018   ALT 18 07/28/2018   AST 11 07/28/2018   NA 139 11/11/2018   K 3.7 11/11/2018   CL 100 11/11/2018   CREATININE 1.01 11/11/2018   BUN 15 11/11/2018   CO2 31 11/11/2018   TSH 1.74 07/28/2018   INR 1.67 (H) 05/27/2014   HGBA1C 5.6 12/29/2014    Dg Swallow Func Op [employer] Speech Path  Result Date: 11/05/2018 Objective Swallowing Evaluation: Type of Study: MBS-Modified Barium Swallow Study  Patient Details Name: [name] [name] MRN: [id number] Date of Birth: 05/06/1933 Today's Date: 11/05/2018 Time: SLP Start Time (ACUTE ONLY): 1056 -SLP Stop Time (ACUTE ONLY): 1110 SLP Time Calculation (min) (ACUTE ONLY): 14 min Past Medical History: Past Medical History: Diagnosis Date . Acute lymphadenitis of arm  . Anemia   iron deficiency . Atrial fibrillation ([place])  . Breast cancer ([place])  . Depression 2009  grief . Diverticulosis of colon 2004  Dr Deatra Ina . GERD (gastroesophageal reflux disease)  . Heart murmur  . Helicobacter pylori gastritis  . Hematuria   Dr Terance Hart . Hyperlipidemia  . Hypertension  . Osteoarthritis, knee  . Skin cancer  . Vitamin B12 deficiency  . Vitamin D deficiency  Past Surgical History: Past Surgical History: Procedure Laterality Date . ABDOMINAL HYSTERECTOMY   . CARDIOVERSION N/A 09/06/2018  Procedure: CARDIOVERSION;  Surgeon: Fay Records, MD;   Location: [facility];  Service: Cardiovascular;  Laterality: N/A; --> after initial success with sinus rhythm, the patient went back into A. fib within 2 minutes. . CORONARY CT ANGIOGRAM  09/14/2018  Coronary Calcium Score 276.  Mild mixed plaque in the left main with minimal stenosis.  Mixed plaque in the ostial LAD, proximal LCx and moderate size OM1, as  well as proximal RCA (<50% stenosis throughout.).  Nonobstructive CAD.  Intermediate risk . JOINT REPLACEMENT   . L groin skin cancer  2011 . MASTECTOMY  09-13-08  left and right . TOTAL KNEE ARTHROPLASTY  06-04-98 . TOTAL KNEE ARTHROPLASTY Right 05/24/2014  Procedure: RIGHT TOTAL KNEE ARTHROPLASTY;  Surgeon: Newt Minion, MD;  Location: [facility];  Service: Orthopedics;  Laterality: Right; . TRANSTHORACIC ECHOCARDIOGRAM  08/11/2018  Persistent A. fib: Mildly reduced EF of 45 to 50% but diffuse HK.  If A. fib the entire time.  Moderate LA and moderate to severe RA dilation (suggesting longstanding A. fib, and potentially explaining difficulty cardioversion).  No significant valve disease.  Aortic sclerosis no stenosis.  Unable to assess diastolic function with A. fib HPI: Pt is an 85 year old female with medical history significant for GERD, She participated in an esophagram on 10/19/18 which revealed the following: Nonspecific esophageal dysmotility disorder, with disruption of primary peristaltic waves on 3/4 swallows in the mid esophagus. Distal esophageal fold thickening which may be a manifestation of mild to moderate esophagitis. No ulceration is identified. No esophageal stricture noted. Mild delay in epiglottic inversion during the pharyngeal phase of swallowing, without laryngeal penetration. An upper GI was conducted on 11/03/18 and showed erythematous mucosa in the gastric fundus and gastric body (bipsied) and a small hiatal hernia. Pt reported that GERD medications were recommended at that time but she has not started them as yet. Pt presents today with  complaints of globus sensation at the level of the sternal notch which is alleviated with a liquid wash. She reported that she has been symptomatic since she had bronchitis and laryngitis in November of 2019 but denied any worsening of symptoms. Per the pt she consumes a regular texture diet with thin liquids.  No data recorded Assessment / Plan / Recommendation CHL IP CLINICAL IMPRESSIONS 11/05/2018 Clinical Impression Pt was seen in radiology suite for modified barium swallow study. She was seated upright in swallow chair for the study which was conducted in lateral view. Trials of puree solids, mechanical soft solids, regular texture solids, thin liquids (via straw), and a 62mm barium tablet with thin liquids were administered. Her oropharyngeal swallow mechanism was within normal limits for her age despite a single instance of transient penetration (PAS 2) with the initial swallow of thin liquids. No significant findings were noted duing esophageal screening. It is recommended that a regular texture diet with thin liquids be continued and further skilled SLP services are not clinically indicated at this time.   SLP Visit Diagnosis Dysphagia, unspecified (R13.10) Attention and concentration deficit following -- Frontal lobe and executive function deficit following -- Impact on safety and function No limitations   No flowsheet data found.  No flowsheet data found. CHL IP DIET RECOMMENDATION 11/05/2018 SLP Diet Recommendations Regular solids;Thin liquid Liquid Administration via Cup;Straw Medication Administration Whole meds with liquid Compensations Slow rate;Small sips/bites Postural Changes Seated upright at 90 degrees   CHL IP OTHER RECOMMENDATIONS 11/05/2018 Recommended Consults -- Oral Care Recommendations Oral care BID Other Recommendations --   CHL IP FOLLOW UP RECOMMENDATIONS 11/05/2018 Follow up Recommendations None   No flowsheet data found.     CHL IP ORAL PHASE 11/05/2018 Oral Phase WFL Oral - Pudding Teaspoon  -- Oral - Pudding Cup -- Oral - Honey Teaspoon -- Oral - Honey Cup -- Oral - Nectar Teaspoon -- Oral - Nectar Cup -- Oral - Nectar Straw -- Oral - Thin Teaspoon -- Oral - Thin  Cup -- Oral - Thin Straw -- Oral - Puree -- Oral - Mech Soft -- Oral - Regular -- Oral - Multi-Consistency -- Oral - Pill -- Oral Phase - Comment --  CHL IP PHARYNGEAL PHASE 11/05/2018 Pharyngeal Phase WFL Pharyngeal- Pudding Teaspoon -- Pharyngeal -- Pharyngeal- Pudding Cup -- Pharyngeal -- Pharyngeal- Honey Teaspoon -- Pharyngeal -- Pharyngeal- Honey Cup -- Pharyngeal -- Pharyngeal- Nectar Teaspoon -- Pharyngeal -- Pharyngeal- Nectar Cup -- Pharyngeal -- Pharyngeal- Nectar Straw -- Pharyngeal -- Pharyngeal- Thin Teaspoon -- Pharyngeal -- Pharyngeal- Thin Cup -- Pharyngeal -- Pharyngeal- Thin Straw -- Pharyngeal -- Pharyngeal- Puree -- Pharyngeal -- Pharyngeal- Mechanical Soft -- Pharyngeal -- Pharyngeal- Regular -- Pharyngeal -- Pharyngeal- Multi-consistency -- Pharyngeal -- Pharyngeal- Pill -- Pharyngeal -- Pharyngeal Comment --  CHL IP CERVICAL ESOPHAGEAL PHASE 11/05/2018 Cervical Esophageal Phase WFL Pudding Teaspoon -- Pudding Cup -- Honey Teaspoon -- Honey Cup -- Nectar Teaspoon -- Nectar Cup -- Nectar Straw -- Thin Teaspoon -- Thin Cup -- Thin Straw -- Puree -- Mechanical Soft -- Regular -- Multi-consistency -- Pill -- Cervical Esophageal Comment -- [name] [name], [place], [place] Office number [phone] Pager [place] 11/05/2018, 11:58 AM            CLINICAL DATA:  Difficulty swallowing. EXAM: MODIFIED BARIUM SWALLOW TECHNIQUE: Different consistencies of barium were administered orally to the patient by the Speech Pathologist. Imaging of the pharynx was performed in the lateral projection. The radiologist was present in the fluoroscopy room for this study, providing personal supervision. FLUOROSCOPY TIME:  Fluoroscopy Time:  45 seconds COMPARISON:  None. FINDINGS: The hypopharynx  demonstrated no filling defects and was normal in contour. The hypopharynx was smooth in. There was no narrowing or stricture. There was no delay in the passage of a barium tablet into the stomach. Mild transient laryngeal penetration with thin fluids was noted. IMPRESSION: Mild transient laryngeal penetration with thin fluids. Please refer to the Speech Pathologists report for complete details and recommendations. Electronically Signed   By: Fidela Salisbury M.D.   On: 11/05/2018 11:30    Assessment & Plan:   Diagnoses and all orders for this visit:  Essential hypertension -     Basic metabolic panel; Future     No orders of the defined types were placed in this encounter.    Follow-up: No follow-ups on file.  Walker Kehr, MD

## 2018-11-13 NOTE — Assessment & Plan Note (Signed)
Fairly normal EGD-March 2020 Dr. Fuller Plan Protonix

## 2018-11-13 NOTE — Assessment & Plan Note (Signed)
Better on Protonix.  GI follow-up is pending -Dr. Fuller Plan

## 2018-11-15 ENCOUNTER — Other Ambulatory Visit: Payer: Self-pay

## 2018-11-15 MED ORDER — BIS SUBCIT-METRONID-TETRACYC 140-125-125 MG PO CAPS: 3.0000 | ORAL_CAPSULE | Freq: Three times a day (TID) | ORAL | 0 refills | Status: DC

## 2018-11-16 ENCOUNTER — Telehealth: Payer: Self-pay | Admitting: Gastroenterology

## 2018-11-16 MED ORDER — BISMUTH SUBSALICYLATE 262 MG PO CHEW: 524.0000 mg | CHEWABLE_TABLET | Freq: Four times a day (QID) | ORAL | 0 refills | Status: AC

## 2018-11-16 MED ORDER — DOXYCYCLINE HYCLATE 100 MG PO CAPS: 100.0000 mg | ORAL_CAPSULE | Freq: Two times a day (BID) | ORAL | 0 refills | Status: AC

## 2018-11-16 MED ORDER — METRONIDAZOLE 250 MG PO TABS: 250.0000 mg | ORAL_TABLET | Freq: Four times a day (QID) | ORAL | 0 refills | Status: AC

## 2018-11-16 NOTE — Telephone Encounter (Signed)
Pt called and stated that the medication was 995.00 and she can not afford and need for something else to be called back in that is covered by ins.

## 2018-11-16 NOTE — Telephone Encounter (Signed)
Informed patient that I will send down a break down if this medication that will cheaper for her. Informed patient to pick up flagyl, doxycycline as prescriptions from the pharmacy but to take pepto bismol otc 2 tablets by mouth four times a day x 14 days and continue her pantoprazole twice daily. Patient verbalized understanding.

## 2018-11-29 ENCOUNTER — Telehealth: Payer: Self-pay | Admitting: Gastroenterology

## 2018-11-29 NOTE — Telephone Encounter (Signed)
Left message for patient to call back  

## 2018-11-29 NOTE — Telephone Encounter (Signed)
PT has one pill left of Doxycycline. she said that she is still not having no taste to food and would like to know why if she is almost done with her medication.

## 2018-11-30 NOTE — Telephone Encounter (Signed)
Patient notified that she may have some side effects from metronidazole.  She will call back for any additional questions or concerns.

## 2018-12-14 ENCOUNTER — Telehealth: Payer: Self-pay

## 2018-12-14 NOTE — Telephone Encounter (Signed)
Please call pt to schedule. Telephone or in person visit.

## 2018-12-14 NOTE — Telephone Encounter (Signed)
Please advise, telephone encounter or regular OV?  Copied from [place] [id number]. Topic: General - Other >> Dec 14, 2018 11:01 AM Carolyn Stare wrote:  Pt lvm on [facility] appt line and is requesting an appt said she is still having trouble with taste   S/w Sam at the office >> Dec 14, 2018 11:17 AM Morey Hummingbird wrote: Patient is unable to do virtual, please advise

## 2018-12-14 NOTE — Telephone Encounter (Signed)
Either way ok w/me Thx

## 2018-12-16 NOTE — Telephone Encounter (Signed)
In office appt scheduled

## 2018-12-20 ENCOUNTER — Ambulatory Visit (INDEPENDENT_AMBULATORY_CARE_PROVIDER_SITE_OTHER): Payer: Medicare Other | Admitting: Internal Medicine

## 2018-12-20 ENCOUNTER — Other Ambulatory Visit: Payer: Self-pay

## 2018-12-20 ENCOUNTER — Other Ambulatory Visit: Payer: Self-pay | Admitting: Internal Medicine

## 2018-12-20 ENCOUNTER — Encounter: Payer: Self-pay | Admitting: Internal Medicine

## 2018-12-20 VITALS — BP 110/68 | HR 65 | Temp 98.2°F | Ht 64.0 in | Wt 199.0 lb

## 2018-12-20 DIAGNOSIS — R29818 Other symptoms and signs involving the nervous system: Secondary | ICD-10-CM

## 2018-12-20 DIAGNOSIS — E538 Deficiency of other specified B group vitamins: Secondary | ICD-10-CM

## 2018-12-20 DIAGNOSIS — I10 Essential (primary) hypertension: Secondary | ICD-10-CM | POA: Diagnosis not present

## 2018-12-20 DIAGNOSIS — I4821 Permanent atrial fibrillation: Secondary | ICD-10-CM

## 2018-12-20 DIAGNOSIS — R432 Parageusia: Secondary | ICD-10-CM

## 2018-12-20 DIAGNOSIS — R600 Localized edema: Secondary | ICD-10-CM

## 2018-12-20 MED ORDER — LORATADINE 10 MG PO TABS: 10.0000 mg | ORAL_TABLET | Freq: Every day | ORAL | 11 refills | Status: DC

## 2018-12-20 MED ORDER — AMLODIPINE BESYLATE 10 MG PO TABS: 5.0000 mg | ORAL_TABLET | Freq: Every day | ORAL | 3 refills | Status: DC

## 2018-12-20 NOTE — Assessment & Plan Note (Addendum)
Worse Chronic since 2017 CT head Claritin

## 2018-12-20 NOTE — Assessment & Plan Note (Signed)
Eliquis 

## 2018-12-20 NOTE — Assessment & Plan Note (Signed)
B 12

## 2018-12-20 NOTE — Patient Instructions (Signed)
Hold Pravastatin x 2 weeks

## 2018-12-20 NOTE — Progress Notes (Signed)
Subjective:  Patient ID: [name] [name], female    DOB: 1933-04-12  Age: 85 y.o. MRN: [id number]  CC: No chief complaint on file.   HPI [name] [name] presents for not being able to taste food since about 11/19. Can taste pickles Able to smell... C/o wt loss 30 lbs C/o B feet edema F/u HTN  Outpatient Medications Prior to Visit  Medication Sig Dispense Refill  . acetaminophen (TYLENOL) 500 MG tablet Take 500 mg by mouth 2 (two) times daily as needed for moderate pain.     Marland Kitchen amLODipine (NORVASC) 10 MG tablet Take 1 tablet (10 mg total) by mouth daily. 90 tablet 3  . apixaban (ELIQUIS) 5 MG TABS tablet Take 1 tablet (5 mg total) by mouth 2 (two) times daily. 60 tablet 3  . [facility] B-12 500 MCG SUBL Place 2 tablets (1,000 mcg total) under the tongue daily. 200 tablet 3  . diphenhydrAMINE (SOMINEX) 25 MG tablet Take 25 mg by mouth at bedtime.     Marland Kitchen ELIQUIS 5 MG TABS tablet TAKE 1 TABLET BY MOUTH TWICE DAILY 180 tablet 1  . hydrocortisone 2.5 % ointment Apply topically 2 (two) times daily. 30 g 3  . losartan (COZAAR) 100 MG tablet Take 1 tablet (100 mg total) by mouth daily. 90 tablet 3  . Multiple Vitamin (MULTIVITAMIN WITH MINERALS) TABS tablet Take 1 tablet by mouth daily.    . pantoprazole (PROTONIX) 40 MG tablet Take 1 tablet (40 mg total) by mouth 2 (two) times daily. 60 tablet 5  . potassium chloride SA (KLOR-CON M20) 20 MEQ tablet Take 1 tablet (20 mEq total) by mouth 2 (two) times daily. 180 tablet 3  . pravastatin (PRAVACHOL) 20 MG tablet Take 1 tablet (20 mg total) by mouth daily. 90 tablet 3  . triamcinolone cream (KENALOG) 0.5 % Apply 1 application topically 4 (four) times daily. On rash 60 g 1   No facility-administered medications prior to visit.     ROS: Review of Systems  Constitutional: Negative for activity change, appetite change, chills, fatigue and unexpected weight change.  HENT: Positive for congestion. Negative for facial swelling, mouth sores, nosebleeds,  postnasal drip, rhinorrhea, sinus pressure, sinus pain and sore throat.   Eyes: Negative for visual disturbance.  Respiratory: Negative for cough and chest tightness.   Gastrointestinal: Negative for abdominal pain and nausea.  Genitourinary: Negative for difficulty urinating, frequency and vaginal pain.  Musculoskeletal: Positive for back pain and gait problem.  Skin: Negative for pallor and rash.  Allergic/Immunologic: Negative for environmental allergies.  Neurological: Negative for dizziness, tremors, weakness, numbness and headaches.  Psychiatric/Behavioral: Negative for confusion, sleep disturbance and suicidal ideas.    Objective:  BP 110/68 (BP Location: Right Arm, Patient Position: Sitting, Cuff Size: Large)   Pulse 65   Temp 98.2 F (36.8 C) (Oral)   Ht 5\' 4"  (1.626 m)   Wt 199 lb (90.3 kg)   SpO2 95%   BMI 34.16 kg/m   BP Readings from Last 3 Encounters:  12/20/18 110/68  11/11/18 114/66  11/03/18 (!) 148/88    Wt Readings from Last 3 Encounters:  12/20/18 199 lb (90.3 kg)  11/11/18 201 lb (91.2 kg)  10/12/18 204 lb 2 oz (92.6 kg)    Physical Exam Constitutional:      General: She is not in acute distress.    Appearance: She is well-developed.  HENT:     Head: Normocephalic.     Right Ear: External ear normal.  Left Ear: External ear normal.     Nose: Nose normal.  Eyes:     General:        Right eye: No discharge.        Left eye: No discharge.     Conjunctiva/sclera: Conjunctivae normal.     Pupils: Pupils are equal, round, and reactive to light.  Neck:     Musculoskeletal: Normal range of motion and neck supple.     Thyroid: No thyromegaly.     Vascular: No JVD.     Trachea: No tracheal deviation.  Cardiovascular:     Rate and Rhythm: Normal rate and regular rhythm.     Heart sounds: Normal heart sounds.  Pulmonary:     Effort: No respiratory distress.     Breath sounds: No stridor. No wheezing.  Abdominal:     General: Bowel sounds are  normal. There is no distension.     Palpations: Abdomen is soft. There is no mass.     Tenderness: There is no abdominal tenderness. There is no guarding or rebound.  Musculoskeletal:        General: No tenderness.  Lymphadenopathy:     Cervical: No cervical adenopathy.  Skin:    Findings: No erythema or rash.  Neurological:     Mental Status: She is oriented to person, place, and time.     Cranial Nerves: No cranial nerve deficit.     Motor: No abnormal muscle tone.     Coordination: Coordination normal.     Gait: Gait abnormal.     Deep Tendon Reflexes: Reflexes normal.  Psychiatric:        Behavior: Behavior normal.        Thought Content: Thought content normal.        Judgment: Judgment normal.   cane L lat ankle is a little swollen  Lab Results  Component Value Date   WBC 5.0 08/30/2018   HGB 12.5 08/30/2018   HCT 38.3 08/30/2018   PLT 261 08/30/2018   GLUCOSE 93 11/11/2018   CHOL 229 (H) 04/21/2018   TRIG 75.0 04/21/2018   HDL 71.80 04/21/2018   LDLCALC 142 (H) 04/21/2018   ALT 18 07/28/2018   AST 11 07/28/2018   NA 139 11/11/2018   K 3.7 11/11/2018   CL 100 11/11/2018   CREATININE 1.01 11/11/2018   BUN 15 11/11/2018   CO2 31 11/11/2018   TSH 1.74 07/28/2018   INR 1.67 (H) 05/27/2014   HGBA1C 5.6 12/29/2014    Dg Swallow Func Op Medicare Speech Path  Result Date: 11/05/2018 Objective Swallowing Evaluation: Type of Study: MBS-Modified Barium Swallow Study  Patient Details Name: [name] [name] MRN: [id number] Date of Birth: 01/08/1933 Today's Date: 11/05/2018 Time: SLP Start Time (ACUTE ONLY): 1056 -SLP Stop Time (ACUTE ONLY): 1110 SLP Time Calculation (min) (ACUTE ONLY): 14 min Past Medical History: Past Medical History: Diagnosis Date . Acute lymphadenitis of arm  . Anemia   iron deficiency . Atrial fibrillation ([place])  . Breast cancer ([place])  . Depression 2009  grief . Diverticulosis of colon 2004  Dr Deatra Ina . GERD (gastroesophageal reflux disease)  . Heart murmur   . Helicobacter pylori gastritis  . Hematuria   Dr Terance Hart . Hyperlipidemia  . Hypertension  . Osteoarthritis, knee  . Skin cancer  . Vitamin B12 deficiency  . Vitamin D deficiency  Past Surgical History: Past Surgical History: Procedure Laterality Date . ABDOMINAL HYSTERECTOMY   . CARDIOVERSION N/A 09/06/2018  Procedure: CARDIOVERSION;  Surgeon: Fay Records, MD;  Location: [facility] ENDOSCOPY;  Service: Cardiovascular;  Laterality: N/A; --> after initial success with sinus rhythm, the patient went back into A. fib within 2 minutes. . CORONARY CT ANGIOGRAM  09/14/2018  Coronary Calcium Score 276.  Mild mixed plaque in the left main with minimal stenosis.  Mixed plaque in the ostial LAD, proximal LCx and moderate size OM1, as well as proximal RCA (<50% stenosis throughout.).  Nonobstructive CAD.  Intermediate risk . JOINT REPLACEMENT   . L groin skin cancer  2011 . MASTECTOMY  09-13-08  left and right . TOTAL KNEE ARTHROPLASTY  06-04-98 . TOTAL KNEE ARTHROPLASTY Right 05/24/2014  Procedure: RIGHT TOTAL KNEE ARTHROPLASTY;  Surgeon: Newt Minion, MD;  Location: [place];  Service: Orthopedics;  Laterality: Right; . TRANSTHORACIC ECHOCARDIOGRAM  08/11/2018  Persistent A. fib: Mildly reduced EF of 45 to 50% but diffuse HK.  If A. fib the entire time.  Moderate LA and moderate to severe RA dilation (suggesting longstanding A. fib, and potentially explaining difficulty cardioversion).  No significant valve disease.  Aortic sclerosis no stenosis.  Unable to assess diastolic function with A. fib HPI: Pt is an 85 year old female with medical history significant for GERD, She participated in an esophagram on 10/19/18 which revealed the following: Nonspecific esophageal dysmotility disorder, with disruption of primary peristaltic waves on 3/4 swallows in the mid esophagus. Distal esophageal fold thickening which may be a manifestation of mild to moderate esophagitis. No ulceration is identified. No esophageal stricture noted. Mild delay in  epiglottic inversion during the pharyngeal phase of swallowing, without laryngeal penetration. An upper GI was conducted on 11/03/18 and showed erythematous mucosa in the gastric fundus and gastric body (bipsied) and a small hiatal hernia. Pt reported that GERD medications were recommended at that time but she has not started them as yet. Pt presents today with complaints of globus sensation at the level of the sternal notch which is alleviated with a liquid wash. She reported that she has been symptomatic since she had bronchitis and laryngitis in November of 2019 but denied any worsening of symptoms. Per the pt she consumes a regular texture diet with thin liquids.  No data recorded Assessment / Plan / Recommendation CHL IP CLINICAL IMPRESSIONS 11/05/2018 Clinical Impression Pt was seen in radiology suite for modified barium swallow study. She was seated upright in swallow chair for the study which was conducted in lateral view. Trials of puree solids, mechanical soft solids, regular texture solids, thin liquids (via straw), and a 13mm barium tablet with thin liquids were administered. Her oropharyngeal swallow mechanism was within normal limits for her age despite a single instance of transient penetration (PAS 2) with the initial swallow of thin liquids. No significant findings were noted duing esophageal screening. It is recommended that a regular texture diet with thin liquids be continued and further skilled SLP services are not clinically indicated at this time.   SLP Visit Diagnosis Dysphagia, unspecified (R13.10) Attention and concentration deficit following -- Frontal lobe and executive function deficit following -- Impact on safety and function No limitations   No flowsheet data found.  No flowsheet data found. CHL IP DIET RECOMMENDATION 11/05/2018 SLP Diet Recommendations Regular solids;Thin liquid Liquid Administration via Cup;Straw Medication Administration Whole meds with liquid Compensations Slow  rate;Small sips/bites Postural Changes Seated upright at 90 degrees   CHL IP OTHER RECOMMENDATIONS 11/05/2018 Recommended Consults -- Oral Care Recommendations Oral care BID Other Recommendations --   CHL IP FOLLOW  UP RECOMMENDATIONS 11/05/2018 Follow up Recommendations None   No flowsheet data found.     CHL IP ORAL PHASE 11/05/2018 Oral Phase WFL Oral - Pudding Teaspoon -- Oral - Pudding Cup -- Oral - Honey Teaspoon -- Oral - Honey Cup -- Oral - Nectar Teaspoon -- Oral - Nectar Cup -- Oral - Nectar Straw -- Oral - Thin Teaspoon -- Oral - Thin Cup -- Oral - Thin Straw -- Oral - Puree -- Oral - Mech Soft -- Oral - Regular -- Oral - Multi-Consistency -- Oral - Pill -- Oral Phase - Comment --  CHL IP PHARYNGEAL PHASE 11/05/2018 Pharyngeal Phase WFL Pharyngeal- Pudding Teaspoon -- Pharyngeal -- Pharyngeal- Pudding Cup -- Pharyngeal -- Pharyngeal- Honey Teaspoon -- Pharyngeal -- Pharyngeal- Honey Cup -- Pharyngeal -- Pharyngeal- Nectar Teaspoon -- Pharyngeal -- Pharyngeal- Nectar Cup -- Pharyngeal -- Pharyngeal- Nectar Straw -- Pharyngeal -- Pharyngeal- Thin Teaspoon -- Pharyngeal -- Pharyngeal- Thin Cup -- Pharyngeal -- Pharyngeal- Thin Straw -- Pharyngeal -- Pharyngeal- Puree -- Pharyngeal -- Pharyngeal- Mechanical Soft -- Pharyngeal -- Pharyngeal- Regular -- Pharyngeal -- Pharyngeal- Multi-consistency -- Pharyngeal -- Pharyngeal- Pill -- Pharyngeal -- Pharyngeal Comment --  CHL IP CERVICAL ESOPHAGEAL PHASE 11/05/2018 Cervical Esophageal Phase WFL Pudding Teaspoon -- Pudding Cup -- Honey Teaspoon -- Honey Cup -- Nectar Teaspoon -- Nectar Cup -- Nectar Straw -- Thin Teaspoon -- Thin Cup -- Thin Straw -- Puree -- Mechanical Soft -- Regular -- Multi-consistency -- Pill -- Cervical Esophageal Comment -- Shanika I. Hardin Negus, [place], [place] Office number [phone] Pager [place] 11/05/2018, 11:58 AM            CLINICAL DATA:  Difficulty swallowing. EXAM: MODIFIED BARIUM SWALLOW  TECHNIQUE: Different consistencies of barium were administered orally to the patient by the Speech Pathologist. Imaging of the pharynx was performed in the lateral projection. The radiologist was present in the fluoroscopy room for this study, providing personal supervision. FLUOROSCOPY TIME:  Fluoroscopy Time:  45 seconds COMPARISON:  None. FINDINGS: The hypopharynx demonstrated no filling defects and was normal in contour. The hypopharynx was smooth in. There was no narrowing or stricture. There was no delay in the passage of a barium tablet into the stomach. Mild transient laryngeal penetration with thin fluids was noted. IMPRESSION: Mild transient laryngeal penetration with thin fluids. Please refer to the Speech Pathologists report for complete details and recommendations. Electronically Signed   By: Fidela Salisbury M.D.   On: 11/05/2018 11:30    Assessment & Plan:   There are no diagnoses linked to this encounter.   No orders of the defined types were placed in this encounter.    Follow-up: No follow-ups on file.  Walker Kehr, MD

## 2018-12-20 NOTE — Assessment & Plan Note (Signed)
Reduced amlodipine to 5 mg/d

## 2018-12-20 NOTE — Assessment & Plan Note (Signed)
Losartan,  Amlodipine - reduce dose

## 2018-12-21 ENCOUNTER — Telehealth: Payer: Self-pay | Admitting: *Deleted

## 2018-12-21 NOTE — Telephone Encounter (Signed)
Covid-19 travel screening questions  Have you traveled in the last 14 days? If yes where? No Do you now or have you had a fever in the last 14 days? No Do you have any respiratory symptoms of shortness of breath or cough now or in the last 14 days? No Do you have any family members or close contacts with diagnosed or suspected Covid-19? No      

## 2018-12-22 ENCOUNTER — Other Ambulatory Visit: Payer: Self-pay

## 2018-12-22 ENCOUNTER — Ambulatory Visit (INDEPENDENT_AMBULATORY_CARE_PROVIDER_SITE_OTHER)
Admission: RE | Admit: 2018-12-22 | Discharge: 2018-12-22 | Disposition: A | Discharge status: Home / Self Care | Payer: Medicare Other | Source: Ambulatory Visit | Attending: Internal Medicine | Admitting: Internal Medicine

## 2018-12-22 DIAGNOSIS — R29818 Other symptoms and signs involving the nervous system: Secondary | ICD-10-CM

## 2018-12-22 DIAGNOSIS — I679 Cerebrovascular disease, unspecified: Secondary | ICD-10-CM | POA: Diagnosis not present

## 2018-12-27 ENCOUNTER — Telehealth: Payer: Self-pay | Admitting: Internal Medicine

## 2018-12-27 NOTE — Telephone Encounter (Signed)
Spoke with patient about CT results, she informed me that she still doesn't have any taste to certain foods.    The only things that she can taste is Coffe, Orange Juice, and lemon.  Patient wants to know when will her taste come back?   Please advise

## 2018-12-27 NOTE — Telephone Encounter (Signed)
I don't know  - it could be weeks. Would she like to see a specialist? Thx

## 2018-12-27 NOTE — Telephone Encounter (Signed)
Please advise 

## 2018-12-30 NOTE — Telephone Encounter (Signed)
Pt notified and does not want to see a specialist at this time.

## 2019-01-05 ENCOUNTER — Ambulatory Visit: Payer: Medicare Other

## 2019-01-10 ENCOUNTER — Other Ambulatory Visit: Payer: Self-pay | Admitting: Pharmacist

## 2019-01-10 ENCOUNTER — Ambulatory Visit: Payer: Self-pay | Admitting: Pharmacist

## 2019-01-10 NOTE — Patient Outreach (Signed)
[facility] [place]  01/10/2019  [name] [name] [birth year] [id number]  Reason for referral: medication assistance  [facility] pharmacy case is being closed due to the following reasons:  -Call placed to patient to reach out to see if she would qualify for medication assistance at this time of year (previously had not met out of pocket spend).  Patient does not wish to apply again at this time.  She states she has tried twice this year with denials x2.  Offered to assist patient, but she states she is doing alright.  Patient has been provided [facility] CM contact information if assistance needed in the future.    Thank you for allowing [facility] pharmacy to be involved in this patient's care.    Regina Eck, PharmD, [place]  [phone]

## 2019-02-02 ENCOUNTER — Other Ambulatory Visit (INDEPENDENT_AMBULATORY_CARE_PROVIDER_SITE_OTHER): Payer: Medicare Other

## 2019-02-02 ENCOUNTER — Encounter: Payer: Self-pay | Admitting: Internal Medicine

## 2019-02-02 ENCOUNTER — Other Ambulatory Visit: Payer: Self-pay

## 2019-02-02 ENCOUNTER — Ambulatory Visit (INDEPENDENT_AMBULATORY_CARE_PROVIDER_SITE_OTHER): Payer: Medicare Other | Admitting: Internal Medicine

## 2019-02-02 VITALS — BP 116/72 | HR 60 | Temp 98.3°F | Ht 64.0 in | Wt 194.0 lb

## 2019-02-02 DIAGNOSIS — G4700 Insomnia, unspecified: Secondary | ICD-10-CM | POA: Diagnosis not present

## 2019-02-02 DIAGNOSIS — R29818 Other symptoms and signs involving the nervous system: Secondary | ICD-10-CM

## 2019-02-02 DIAGNOSIS — K1120 Sialoadenitis, unspecified: Secondary | ICD-10-CM | POA: Diagnosis not present

## 2019-02-02 DIAGNOSIS — R430 Anosmia: Secondary | ICD-10-CM

## 2019-02-02 DIAGNOSIS — I10 Essential (primary) hypertension: Secondary | ICD-10-CM

## 2019-02-02 DIAGNOSIS — E538 Deficiency of other specified B group vitamins: Secondary | ICD-10-CM | POA: Diagnosis not present

## 2019-02-02 DIAGNOSIS — R432 Parageusia: Secondary | ICD-10-CM | POA: Diagnosis not present

## 2019-02-02 LAB — HEPATIC FUNCTION PANEL
ALT: 32 U/L (ref 0–35)
AST: 24 U/L (ref 0–37)
Albumin: 4.1 g/dL (ref 3.5–5.2)
Alkaline Phosphatase: 62 U/L (ref 39–117)
Bilirubin, Direct: 0.2 mg/dL (ref 0.0–0.3)
Total Bilirubin: 0.7 mg/dL (ref 0.2–1.2)
Total Protein: 7.5 g/dL (ref 6.0–8.3)

## 2019-02-02 LAB — CBC WITH DIFFERENTIAL/PLATELET
Basophils Absolute: 0 10*3/uL (ref 0.0–0.1)
Basophils Relative: 0.4 % (ref 0.0–3.0)
Eosinophils Absolute: 0.1 10*3/uL (ref 0.0–0.7)
Eosinophils Relative: 2.9 % (ref 0.0–5.0)
HCT: 40.2 % (ref 36.0–46.0)
Hemoglobin: 13.5 g/dL (ref 12.0–15.0)
Lymphocytes Relative: 53.1 % — ABNORMAL HIGH (ref 12.0–46.0)
Lymphs Abs: 2.4 10*3/uL (ref 0.7–4.0)
MCHC: 33.5 g/dL (ref 30.0–36.0)
MCV: 85.6 fl (ref 78.0–100.0)
Monocytes Absolute: 0.6 10*3/uL (ref 0.1–1.0)
Monocytes Relative: 12.8 % — ABNORMAL HIGH (ref 3.0–12.0)
Neutro Abs: 1.4 10*3/uL (ref 1.4–7.7)
Neutrophils Relative %: 30.8 % — ABNORMAL LOW (ref 43.0–77.0)
Platelets: 229 10*3/uL (ref 150.0–400.0)
RBC: 4.7 Mil/uL (ref 3.87–5.11)
RDW: 15.2 % (ref 11.5–15.5)
WBC: 4.5 10*3/uL (ref 4.0–10.5)

## 2019-02-02 LAB — BASIC METABOLIC PANEL
BUN: 10 mg/dL (ref 6–23)
CO2: 29 mEq/L (ref 19–32)
Calcium: 9.9 mg/dL (ref 8.4–10.5)
Chloride: 101 mEq/L (ref 96–112)
Creatinine, Ser: 1.03 mg/dL (ref 0.40–1.20)
GFR: 61.77 mL/min (ref >60.00)
Glucose, Bld: 98 mg/dL (ref 70–99)
Potassium: 4 mEq/L (ref 3.5–5.1)
Sodium: 139 mEq/L (ref 135–145)

## 2019-02-02 LAB — VITAMIN B12: Vitamin B-12: 598 pg/mL (ref 211–911)

## 2019-02-02 LAB — TSH: TSH: 1.62 u[IU]/mL (ref 0.35–4.50)

## 2019-02-02 MED ORDER — CEPHALEXIN 500 MG PO CAPS: 500.0000 mg | ORAL_CAPSULE | Freq: Four times a day (QID) | ORAL | 0 refills | Status: AC

## 2019-02-02 NOTE — Assessment & Plan Note (Signed)
Benadryl low dose at HS - working well

## 2019-02-02 NOTE — Assessment & Plan Note (Signed)
ENT ref 

## 2019-02-02 NOTE — Assessment & Plan Note (Signed)
Losartan, Amlodipine

## 2019-02-02 NOTE — Assessment & Plan Note (Signed)
Worse ENT ref Keflex if worse

## 2019-02-02 NOTE — Progress Notes (Addendum)
Subjective:  Patient ID: [name] [name], female    DOB: 07/24/1933  Age: 85 y.o. MRN: [id number]  CC: No chief complaint on file.   HPI [name] [name] presents for R gland swelling - worse w/eating x weeks; anosmia x 12 mo, HTN f/u  Outpatient Medications Prior to Visit  Medication Sig Dispense Refill  . acetaminophen (TYLENOL) 500 MG tablet Take 500 mg by mouth 2 (two) times daily as needed for moderate pain.     Marland Kitchen amLODipine (NORVASC) 10 MG tablet Take 0.5 tablets (5 mg total) by mouth daily. 90 tablet 3  . CVS B-12 500 MCG SUBL Place 2 tablets (1,000 mcg total) under the tongue daily. 200 tablet 3  . diphenhydrAMINE (SOMINEX) 25 MG tablet Take 25 mg by mouth at bedtime.     Marland Kitchen ELIQUIS 5 MG TABS tablet TAKE 1 TABLET BY MOUTH TWICE DAILY 180 tablet 1  . hydrocortisone 2.5 % ointment Apply topically 2 (two) times daily. 30 g 3  . loratadine (CLARITIN) 10 MG tablet Take 1 tablet (10 mg total) by mouth daily. 100 tablet 11  . losartan (COZAAR) 100 MG tablet Take 1 tablet (100 mg total) by mouth daily. 90 tablet 3  . Multiple Vitamin (MULTIVITAMIN WITH MINERALS) TABS tablet Take 1 tablet by mouth daily.    . pantoprazole (PROTONIX) 40 MG tablet Take 1 tablet (40 mg total) by mouth 2 (two) times daily. 60 tablet 5  . potassium chloride SA (KLOR-CON M20) 20 MEQ tablet Take 1 tablet (20 mEq total) by mouth 2 (two) times daily. 180 tablet 3  . pravastatin (PRAVACHOL) 20 MG tablet Take 1 tablet (20 mg total) by mouth daily. 90 tablet 3  . triamcinolone cream (KENALOG) 0.5 % Apply 1 application topically 4 (four) times daily. On rash 60 g 1   No facility-administered medications prior to visit.     ROS: Review of Systems  Constitutional: Negative for activity change, appetite change, chills, fatigue, fever and unexpected weight change.  HENT: Negative for congestion, mouth sores and sinus pressure.   Eyes: Negative for visual disturbance.  Respiratory: Negative for cough and chest  tightness.   Gastrointestinal: Negative for abdominal pain and nausea.  Genitourinary: Negative for difficulty urinating, frequency and vaginal pain.  Musculoskeletal: Positive for back pain and gait problem.  Skin: Negative for pallor and rash.  Neurological: Negative for dizziness, tremors, weakness, numbness and headaches.  Psychiatric/Behavioral: Positive for sleep disturbance. Negative for confusion and suicidal ideas.    Objective:  BP 116/72 (BP Location: Right Arm, Patient Position: Sitting, Cuff Size: Normal)   Pulse 60   Temp 98.3 F (36.8 C) (Oral)   Ht 5\' 4"  (1.626 m)   Wt 194 lb (88 kg)   SpO2 98%   BMI 33.30 kg/m   BP Readings from Last 3 Encounters:  02/02/19 116/72  12/20/18 110/68  11/11/18 114/66    Wt Readings from Last 3 Encounters:  02/02/19 194 lb (88 kg)  12/20/18 199 lb (90.3 kg)  11/11/18 201 lb (91.2 kg)    Physical Exam Constitutional:      General: She is not in acute distress.    Appearance: She is well-developed.  HENT:     Head: Normocephalic.     Right Ear: External ear normal.     Left Ear: External ear normal.     Nose: Nose normal.  Eyes:     General:        Right eye: No discharge.  Left eye: No discharge.     Conjunctiva/sclera: Conjunctivae normal.     Pupils: Pupils are equal, round, and reactive to light.  Neck:     Musculoskeletal: Normal range of motion and neck supple.     Thyroid: No thyromegaly.     Vascular: No JVD.     Trachea: No tracheal deviation.  Cardiovascular:     Rate and Rhythm: Normal rate and regular rhythm.     Heart sounds: Normal heart sounds.  Pulmonary:     Effort: No respiratory distress.     Breath sounds: No stridor. No wheezing.  Abdominal:     General: Bowel sounds are normal. There is no distension.     Palpations: Abdomen is soft. There is no mass.     Tenderness: There is no abdominal tenderness. There is no guarding or rebound.  Musculoskeletal:        General: No tenderness.   Lymphadenopathy:     Cervical: No cervical adenopathy.  Skin:    Findings: No erythema or rash.  Neurological:     Cranial Nerves: No cranial nerve deficit.     Motor: No abnormal muscle tone.     Coordination: Coordination normal.     Deep Tendon Reflexes: Reflexes normal.  Psychiatric:        Behavior: Behavior normal.        Thought Content: Thought content normal.        Judgment: Judgment normal.   swollen gland under R jaw Cane obese  Lab Results  Component Value Date   WBC 5.0 08/30/2018   HGB 12.5 08/30/2018   HCT 38.3 08/30/2018   PLT 261 08/30/2018   GLUCOSE 93 11/11/2018   CHOL 229 (H) 04/21/2018   TRIG 75.0 04/21/2018   HDL 71.80 04/21/2018   LDLCALC 142 (H) 04/21/2018   ALT 18 07/28/2018   AST 11 07/28/2018   NA 139 11/11/2018   K 3.7 11/11/2018   CL 100 11/11/2018   CREATININE 1.01 11/11/2018   BUN 15 11/11/2018   CO2 31 11/11/2018   TSH 1.74 07/28/2018   INR 1.67 (H) 05/27/2014   HGBA1C 5.6 12/29/2014    Ct Head Wo Contrast  Result Date: 12/22/2018 CLINICAL DATA:  History of breast carcinoma.  Loss of sense of taste EXAM: CT HEAD WITHOUT CONTRAST TECHNIQUE: Contiguous axial images were obtained from the base of the skull through the vertex without intravenous contrast. COMPARISON:  None. FINDINGS: Brain: There is age related volume loss. There is no intracranial mass, hemorrhage, extra-axial fluid collection, or midline shift. There is mild patchy small vessel disease in the centra semiovale bilaterally. No acute infarct is demonstrable on this study. Vascular: There is no appreciable hyperdense vessel. There is calcification in each carotid siphon region. Skull: The bony calvarium appears intact. Sinuses/Orbits: Visualized paranasal sinuses are clear. Visualized orbits appear symmetric bilaterally. Other: Visualized mastoid air cells are clear. IMPRESSION: Age related volume loss with mild patchy periventricular small vessel disease. No acute infarct. No  mass or hemorrhage. There are foci of arterial vascular calcification. Electronically Signed   By: Lowella Grip III M.D.   On: 12/22/2018 14:21    Assessment & Plan:   There are no diagnoses linked to this encounter.   No orders of the defined types were placed in this encounter.    Follow-up: No follow-ups on file.  Walker Kehr, MD

## 2019-02-09 ENCOUNTER — Ambulatory Visit: Payer: Medicare Other | Admitting: Internal Medicine

## 2019-02-17 ENCOUNTER — Telehealth: Payer: Self-pay | Admitting: Internal Medicine

## 2019-02-17 NOTE — Telephone Encounter (Signed)
Patient states she has scheduled an appt for tomorrow 6/19 at Dr. Rosina Lowenstein office b/c she never heard from [facility].  Patient is requesting the referral to be sent today to that office.

## 2019-02-17 NOTE — Telephone Encounter (Signed)
Referral sent to Dr. Pollie Friar office and pt is aware

## 2019-02-18 DIAGNOSIS — K1121 Acute sialoadenitis: Secondary | ICD-10-CM | POA: Diagnosis not present

## 2019-02-24 ENCOUNTER — Telehealth: Payer: Self-pay | Admitting: Cardiology

## 2019-02-24 MED ORDER — FUROSEMIDE 40 MG PO TABS: ORAL_TABLET | ORAL | 1 refills | Status: DC

## 2019-02-24 NOTE — Telephone Encounter (Signed)
Called patient, she states that for the past three nights her SOB has been worse- it is only worse at night when she tries to lay down to go to sleep, she begins to wheeze. She is not SOB when up moving around, only at night. She does states she has some chest pain, but it is nothing new for her and she has had it for a long time. She does have swelling in her feet since last month her PCP told her to only take 1/2 of her Amlodipine to see if it helped with swelling but it has not. Patient has not been checking BP at home, and denies any diet changes. She does not wear compression stockings at this time. But states she has lost weight.  She would like recommendations from [place] on how to handle sleeping at night with her breathing issue. Will route to MD and nurse.

## 2019-02-24 NOTE — Telephone Encounter (Signed)
  Pt c/o Shortness Of Breath: STAT if SOB developed within the last 24 hours or pt is noticeably SOB on the phone  1. Are you currently SOB (can you hear that pt is SOB on the phone)? no  2. How long have you been experiencing SOB? About 3 days  3. Are you SOB when sitting or when up moving around? Mostly when laying down  4. Are you currently experiencing any other symptoms? When she lays down she feels like she is having wheezing. Patient denies any other symptoms

## 2019-02-24 NOTE — Telephone Encounter (Signed)
  Spoke with patient and she was very upset that no on e had called her since she spoke with Maudie Mercury at 8:55. Patient denies speaking with Almyra Free however she told me same thing Almyra Free documented. Reviewed Dr Allison Quarry recommendations with patient and scheduled visit for Monday. Patient verbalized understanding.   Patient answered no to the below questions except for the shortness of breath at night as discussed in note.   COVID-19 Pre-Screening Questions:  . In the past 7 to 10 days have you had a cough,  shortness of breath, headache, congestion, fever (100 or greater) body aches, chills, sore throat, or sudden loss of taste or sense of smell? . Have you been around anyone with known Covid 19. . Have you been around anyone who is awaiting Covid 19 test results in the past 7 to 10 days? . Have you been around anyone who has been exposed to Covid 19, or has mentioned symptoms of Covid 19 within the past 7 to 10 days?  If you have any concerns/questions about symptoms patients report during screening (either on the phone or at threshold). Contact the provider seeing the patient or DOD for further guidance.  If neither are available contact a member of the leadership team.

## 2019-02-24 NOTE — Telephone Encounter (Signed)
Hard to tell what is going on.  Would probably be best for her to be evaluated where we can check an EKG.  For now, let us give her prescription of furosemide 40 mg tablets 1 tablet a day for 3 days and then as needed for worsening swelling or shortness of breath. Try to get her in to be seen early next week if possible.  If symptoms worsen, and not able to sleep because of dyspnea, would recommend ER visit if not able to be seen as a work in visit in the clinic.   Glenetta Hew, MD

## 2019-02-28 ENCOUNTER — Other Ambulatory Visit: Payer: Self-pay

## 2019-02-28 ENCOUNTER — Ambulatory Visit (INDEPENDENT_AMBULATORY_CARE_PROVIDER_SITE_OTHER): Payer: Medicare Other | Admitting: Cardiology

## 2019-02-28 ENCOUNTER — Encounter: Payer: Self-pay | Admitting: Cardiology

## 2019-02-28 VITALS — BP 126/77 | HR 79 | Ht 62.0 in | Wt 192.2 lb

## 2019-02-28 DIAGNOSIS — I4821 Permanent atrial fibrillation: Secondary | ICD-10-CM

## 2019-02-28 DIAGNOSIS — R600 Localized edema: Secondary | ICD-10-CM | POA: Diagnosis not present

## 2019-02-28 DIAGNOSIS — I10 Essential (primary) hypertension: Secondary | ICD-10-CM | POA: Diagnosis not present

## 2019-02-28 DIAGNOSIS — I5032 Chronic diastolic (congestive) heart failure: Secondary | ICD-10-CM

## 2019-02-28 DIAGNOSIS — R0601 Orthopnea: Secondary | ICD-10-CM | POA: Diagnosis not present

## 2019-02-28 DIAGNOSIS — R0602 Shortness of breath: Secondary | ICD-10-CM | POA: Insufficient documentation

## 2019-02-28 MED ORDER — METOPROLOL TARTRATE 25 MG PO TABS: 25.0000 mg | ORAL_TABLET | ORAL | 6 refills | Status: DC | PRN

## 2019-02-28 NOTE — Patient Outreach (Signed)
[facility]) Care Management  02/28/2019  [name] [name] 1933/08/27 [id number]   Medication Adherence call to [name] [name] Compliant Voice message left with a call back number. [name] [name] is showing past due on Pravastatin 20 mg and Losartan 100 mg under [name] [name].   [place] Management Direct Dial [phone]  Fax [phone] [name]

## 2019-02-28 NOTE — Patient Instructions (Addendum)
Medication Instructions:   CONTINUE TO USE LASIX ( FUROSEMIDE) AS NEEDED FOR SHORTNESS OF BREATHE.  CAN USE METOPROLOL TARTRATE 25 MG( 1 TABLET)  AS NEED FOR PALPITATION DAILY   If you need a refill on your cardiac medications before your next appointment, please call your pharmacy.   Lab work:    Testing/Procedures: NOT NEEDED  Follow-Up: At [facility], you and your health needs are our priority.  As part of our continuing mission to provide you with exceptional heart care, we have created designated Provider Care Teams.  These Care Teams include your primary Cardiologist (physician) and Advanced Practice Providers (APPs -  Physician Assistants and Nurse Practitioners) who all work together to provide you with the care you need, when you need it. . You will need  TO KEEP  follow up appointment  IN SEPT 2020  Any Other Special Instructions Will Be Listed Below (If Applicable).

## 2019-02-28 NOTE — Progress Notes (Signed)
PCP: Plotnikov, Evie Lacks, MD  Clinic Note: Chief Complaint  Patient presents with  . Follow-up    after starting Lasix for PND/Orthopnea, edema & dyspena  . Congestive Heart Failure    HFpEF  . Atrial Fibrillation    HPI: [name] [name] is a 85 y.o. female with a PMH notable for relatively new Dx Persistent/Permanent Afib who presents today for 4-5 month f/u.  JNAE THOMASTON was last seen in Jan 2020 s/p unsuccessful DCCV after initial viist 08/04/2018.  - had been changed to Eliquis.  - stopped Multaq - did not want Antiarrhyrhmic   Phone call 6/25: past three nights her SOB has been worse- it is only worse at night when she tries to lay down to go to sleep, she begins to wheeze. She is not SOB when up moving around, only at night. She does states she has some chest pain, but it is nothing new for her and she has had it for a long time. She does have swelling in her feet since last month her PCP told her to only take 1/2 of her Amlodipine to see if it helped with swelling but it has not  Recent Hospitalizations: none  Studies Personally Reviewed - (if available, images/films reviewed: From Epic Chart or Care Everywhere)  none  Interval History: [name] [name] is being seen today in follow-up after her issues with shortness of breath, edema and PND/orthopnea.  Pretty much once we started her on Lasix, she had 2 more nights of waking up short of breath, but then has been doing well ever since that.  Leg swelling is notably improved.  Exertional dyspnea is also improved. She is currently taking the Lasix as needed now, and ever since the 3 days of taking 40 mg, she she said that no more PND orthopnea. No sensation of rapid irregular heartbeats or palpitation.  No indication that she knows if she is or is not in A. fib.  No bleeding issues.  No chest pain with rest or exertion.  No PND, orthopnea or edema.  No palpitations, lightheadedness, dizziness, weakness or syncope/near  syncope. No TIA/amaurosis fugax symptoms. No claudication.  ROS: A comprehensive was performed. Review of Systems  Constitutional: Positive for weight loss (With diuresis). Negative for malaise/fatigue.  HENT: Negative for congestion and nosebleeds.   Respiratory: Negative for cough, shortness of breath and wheezing.        Per HPI-improved dyspnea  Cardiovascular: Positive for leg swelling (Controlled). Negative for chest pain and claudication.  Gastrointestinal: Negative for abdominal pain, blood in stool, constipation, heartburn, melena, nausea and vomiting.  Genitourinary: Negative for hematuria.  Musculoskeletal: Negative for falls and joint pain.  Skin: Negative.   Neurological: Negative for dizziness and headaches.  Endo/Heme/Allergies: Does not bruise/bleed easily.  Psychiatric/Behavioral: Negative.   All other systems reviewed and are negative.   I have reviewed and (if needed) personally updated the patient's problem list, medications, allergies, past medical and surgical history, social and family history.   Past Medical History:  Diagnosis Date  . Acute lymphadenitis of arm   . Anemia    iron deficiency  . Atrial fibrillation ([place])   . Breast cancer ([place])   . Depression 2009   grief  . Diverticulosis of colon 2004   Dr Deatra Ina  . GERD (gastroesophageal reflux disease)   . Heart murmur   . Helicobacter pylori gastritis   . Hematuria    Dr Terance Hart  . Hyperlipidemia   . Hypertension   .  Osteoarthritis, knee   . Skin cancer   . Vitamin B12 deficiency   . Vitamin D deficiency     Past Surgical History:  Procedure Laterality Date  . ABDOMINAL HYSTERECTOMY    . CARDIOVERSION N/A 09/06/2018   Procedure: CARDIOVERSION;  Surgeon: Fay Records, MD;  Location: [facility];  Service: Cardiovascular;  Laterality: N/A; --> after initial success with sinus rhythm, the patient went back into A. fib within 2 minutes.  . CORONARY CT ANGIOGRAM  09/14/2018   Coronary  Calcium Score 276.  Mild mixed plaque in the left main with minimal stenosis.  Mixed plaque in the ostial LAD, proximal LCx and moderate size OM1, as well as proximal RCA (<50% stenosis throughout.).  Nonobstructive CAD.  Intermediate risk  . JOINT REPLACEMENT    . L groin skin cancer  2011  . MASTECTOMY  09-13-08   left and right  . TOTAL KNEE ARTHROPLASTY  06-04-98  . TOTAL KNEE ARTHROPLASTY Right 05/24/2014   Procedure: RIGHT TOTAL KNEE ARTHROPLASTY;  Surgeon: Newt Minion, MD;  Location: [place];  Service: Orthopedics;  Laterality: Right;  . TRANSTHORACIC ECHOCARDIOGRAM  08/11/2018   Persistent A. fib: Mildly reduced EF of 45 to 50% but diffuse HK.  If A. fib the entire time.  Moderate LA and moderate to severe RA dilation (suggesting longstanding A. fib, and potentially explaining difficulty cardioversion).  No significant valve disease.  Aortic sclerosis no stenosis.  Unable to assess diastolic function with A. fib     DCCV attempt September 06, 2018 (Dr. Harrington Challenger): Initial success with cardioversion and then reversion back to atrial fibrillation.  Multaq discontinued.  Plan for rate control.  Current Meds  Medication Sig  . acetaminophen (TYLENOL) 500 MG tablet Take 500 mg by mouth 2 (two) times daily as needed for moderate pain.   Marland Kitchen amLODipine (NORVASC) 10 MG tablet Take 0.5 tablets (5 mg total) by mouth daily.  . [employer] B-12 500 MCG SUBL Place 2 tablets (1,000 mcg total) under the tongue daily.  . diphenhydrAMINE (SOMINEX) 25 MG tablet Take 25 mg by mouth at bedtime.   Marland Kitchen ELIQUIS 5 MG TABS tablet TAKE 1 TABLET BY MOUTH TWICE DAILY  . furosemide (LASIX) 40 MG tablet One tablet by mouth daily for 3 days and then as needed for swelling or shortness of breath.  . hydrocortisone 2.5 % ointment Apply topically 2 (two) times daily.  Marland Kitchen loratadine (CLARITIN) 10 MG tablet Take 1 tablet (10 mg total) by mouth daily.  Marland Kitchen losartan (COZAAR) 100 MG tablet Take 1 tablet (100 mg total) by mouth daily.  . Multiple  Vitamin (MULTIVITAMIN WITH MINERALS) TABS tablet Take 1 tablet by mouth daily.  . pantoprazole (PROTONIX) 40 MG tablet Take 1 tablet (40 mg total) by mouth 2 (two) times daily.  . potassium chloride SA (KLOR-CON M20) 20 MEQ tablet Take 1 tablet (20 mEq total) by mouth 2 (two) times daily.  . pravastatin (PRAVACHOL) 20 MG tablet Take 1 tablet (20 mg total) by mouth daily.  Marland Kitchen triamcinolone cream (KENALOG) 0.5 % Apply 1 application topically 4 (four) times daily. On rash    Allergies  Allergen Reactions  . Anastrozole     arthralgias  . Aspirin Other (See Comments)    Gi upset   . Ciprofloxacin Itching  . Letrozole     leg pain    Social History   Tobacco Use  . Smoking status: Never Smoker  . Smokeless tobacco: Never Used  Substance Use Topics  .  Alcohol use: No  . Drug use: No   Social History   Social History Narrative  . Not on file    family history includes Atrial fibrillation in an other family member; Cancer in her maternal aunt, sister, and another family member; Coronary artery disease in an other family member.  Wt Readings from Last 3 Encounters:  02/28/19 192 lb 3.2 oz (87.2 kg)  02/02/19 194 lb (88 kg)  12/20/18 199 lb (90.3 kg)    PHYSICAL EXAM BP 126/77   Pulse 79   Ht 5\' 2"  (1.575 m)   Wt 192 lb 3.2 oz (87.2 kg)   BMI 35.15 kg/m  Physical Exam  Constitutional: She is oriented to person, place, and time. She appears well-developed and well-nourished.  Healthy-appearing elderly woman in no acute distress.  Well-groomed.    HENT:  Head: Normocephalic and atraumatic.  Neck: Normal range of motion. Neck supple. No JVD present. Carotid bruit is not present.  Cardiovascular: Normal rate, regular rhythm, normal heart sounds and intact distal pulses.  No extrasystoles are present. PMI is not displaced. Exam reveals no gallop and no friction rub.  No murmur heard. Pulmonary/Chest: Effort normal and breath sounds normal. No respiratory distress. She has no  wheezes. She has no rales.  Abdominal: Soft. Bowel sounds are normal. She exhibits no distension. There is no abdominal tenderness. There is no rebound.  Musculoskeletal: Normal range of motion.        General: No edema.  Neurological: She is alert and oriented to person, place, and time.  Psychiatric: She has a normal mood and affect. Her behavior is normal. Judgment and thought content normal.  Vitals reviewed.    Adult ECG Report  Rate: 79 ;  Rhythm: atrial fibrillation and Right axis deviation, IVCD with T wave inversions, PVCs;   Narrative Interpretation: Relatively stable EKG   Other studies Reviewed: Additional studies/ records that were reviewed today include:  Recent Labs:   Lab Results  Component Value Date   CREATININE 1.03 02/02/2019   BUN 10 02/02/2019   NA 139 02/02/2019   K 4.0 02/02/2019   CL 101 02/02/2019   CO2 29 02/02/2019   CBC Latest Ref Rng & Units 02/02/2019 08/30/2018 07/28/2018  WBC 4.0 - 10.5 K/uL 4.5 5.0 8.9  Hemoglobin 12.0 - 15.0 g/dL 13.5 12.5 13.1  Hematocrit 36.0 - 46.0 % 40.2 38.3 39.5  Platelets 150.0 - 400.0 K/uL 229.0 261 257.0     ASSESSMENT / PLAN: Problem List Items Addressed This Visit    Permanent atrial fibrillation:  CHA2DS2-VASc Score (Eliquis) - Primary (Chronic)    Baseline rate controlled without any AV nodal agent.  Did not tolerate Multitak, unable to maintain sinus rhythm.  We will basically consider this permanent A. fib and can plan for rate control only. We will use PRN metoprolol for rapid heart rate, Continue Eliquis for anticoagulation  This patients CHA2DS2-VASc Score and unadjusted Ischemic Stroke Rate (% per year) is equal to 9.7 % stroke rate/year from a score of 6  Above score calculated as 1 point each if present [CHF, HTN, DM, Vascular=MI/PAD/Aortic Plaque, Age if 65-74, or Female] Above score calculated as 2 points each if present [Age > 75, or Stroke/TIA/TE]        Relevant Medications   metoprolol  tartrate (LOPRESSOR) 25 MG tablet   Other Relevant Orders   EKG 12-Lead (Completed)   Orthopnea    SSx consistent with HFpEF complicated by Afib -- (Hypertensive Heart Disease)  Discussed pathophysiology of diastolic heart failure with symptoms of orthopnea and edema.      Relevant Orders   EKG 12-Lead (Completed)   Essential hypertension (Chronic)    Current blood pressures pretty stable on amlodipine and ARB. Has not really needed rate control, for PRN metoprolol 25 mg - for SBP >160 mmHg or HR > 100 bpm      Relevant Medications   metoprolol tartrate (LOPRESSOR) 25 MG tablet   Other Relevant Orders   EKG 12-Lead (Completed)   Edema of both lower extremities    PRN Lasix - if frequently needed, will convert to standing dose with PRN.      Relevant Orders   EKG 12-Lead (Completed)   Chronic diastolic heart failure (HCC) (Chronic)    Recent ischemic evaluation and echo showed normal EF with normal coronaries.  Her presentation via telephone with symptoms that are really concerning for diastolic heart failure and volume overload is probably related to hypertension and A. fib with diastolic dysfunction. Plan: Currently on only as needed Lasix which will have her continue to use based on daily weights (weight gain greater than 3 pounds) or for worsening edema.   If she needs to use PRN Lasix more frequently, would probably simply have a standing dose of 20 mg with additional 20 mg as needed.  She is on good dose of losartan for afterload reduction along with amlodipine.  PRN metoprolol 25 mg for high blood pressure (>160 mmHg and/or heart rates greater than 100 bpm)      Relevant Medications   metoprolol tartrate (LOPRESSOR) 25 MG tablet      I spent a total of 22 minutes with the patient and chart review. >  50% of the time was spent in direct patient consultation.   Current medicines are reviewed at length with the patient today.  (+/- concerns) how to take Lasix The  following changes have been made:  see below   Patient Instructions  Medication Instructions:   CONTINUE TO USE LASIX ( FUROSEMIDE) AS NEEDED FOR SHORTNESS OF BREATHE.  CAN USE METOPROLOL TARTRATE 25 MG( 1 TABLET)  AS NEED FOR PALPITATION DAILY   If you need a refill on your cardiac medications before your next appointment, please call your pharmacy.   Lab work:    Testing/Procedures: NOT NEEDED  Follow-Up: At [facility], you and your health needs are our priority.  As part of our continuing mission to provide you with exceptional heart care, we have created designated Provider Care Teams.  These Care Teams include your primary Cardiologist (physician) and Advanced Practice Providers (APPs -  Physician Assistants and Nurse Practitioners) who all work together to provide you with the care you need, when you need it. . You will need  TO KEEP  follow up appointment  IN SEPT 2020  Any Other Special Instructions Will Be Listed Below (If Applicable).    Studies Ordered:   Orders Placed This Encounter  Procedures  . EKG 12-Lead      Glenetta Hew, M.D., M.S. Interventional Cardiologist   Pager # [phone] Phone # [phone] [address] [place], [place] [address]   Thank you for choosing Heartcare at [facility]!!

## 2019-03-08 ENCOUNTER — Encounter: Payer: Self-pay | Admitting: Cardiology

## 2019-03-08 DIAGNOSIS — I5042 Chronic combined systolic (congestive) and diastolic (congestive) heart failure: Secondary | ICD-10-CM | POA: Insufficient documentation

## 2019-03-08 NOTE — Assessment & Plan Note (Signed)
Baseline rate controlled without any AV nodal agent.  Did not tolerate Multitak, unable to maintain sinus rhythm.  We will basically consider this permanent A. fib and can plan for rate control only. We will use PRN metoprolol for rapid heart rate, Continue Eliquis for anticoagulation  This patients CHA2DS2-VASc Score and unadjusted Ischemic Stroke Rate (% per year) is equal to 9.7 % stroke rate/year from a score of 6  Above score calculated as 1 point each if present [CHF, HTN, DM, Vascular=MI/PAD/Aortic Plaque, Age if 65-74, or Female] Above score calculated as 2 points each if present [Age > 75, or Stroke/TIA/TE]

## 2019-03-08 NOTE — Telephone Encounter (Signed)
Patient told to call Dr. Jennell Corner office

## 2019-03-08 NOTE — Assessment & Plan Note (Signed)
Recent ischemic evaluation and echo showed normal EF with normal coronaries.  Her presentation via telephone with symptoms that are really concerning for diastolic heart failure and volume overload is probably related to hypertension and A. fib with diastolic dysfunction. Plan: Currently on only as needed Lasix which will have her continue to use based on daily weights (weight gain greater than 3 pounds) or for worsening edema.   If she needs to use PRN Lasix more frequently, would probably simply have a standing dose of 20 mg with additional 20 mg as needed.  She is on good dose of losartan for afterload reduction along with amlodipine.  PRN metoprolol 25 mg for high blood pressure (>160 mmHg and/or heart rates greater than 100 bpm)

## 2019-03-08 NOTE — Telephone Encounter (Signed)
Patient would like a call back from [place] because she went to [facility] for her appointment and never heard back from them.

## 2019-03-08 NOTE — Assessment & Plan Note (Signed)
PRN Lasix - if frequently needed, will convert to standing dose with PRN.

## 2019-03-08 NOTE — Assessment & Plan Note (Signed)
Current blood pressures pretty stable on amlodipine and ARB. Has not really needed rate control, for PRN metoprolol 25 mg - for SBP >160 mmHg or HR > 100 bpm

## 2019-03-08 NOTE — Assessment & Plan Note (Signed)
SSx consistent with HFpEF complicated by Afib -- (Hypertensive Heart Disease)   Discussed pathophysiology of diastolic heart failure with symptoms of orthopnea and edema.

## 2019-03-09 DIAGNOSIS — R1313 Dysphagia, pharyngeal phase: Secondary | ICD-10-CM | POA: Diagnosis not present

## 2019-03-09 DIAGNOSIS — K1121 Acute sialoadenitis: Secondary | ICD-10-CM | POA: Diagnosis not present

## 2019-03-25 ENCOUNTER — Other Ambulatory Visit: Payer: Self-pay

## 2019-03-25 NOTE — Patient Outreach (Signed)
[facility]) Care Management  03/25/2019  KRISTENA WILHELMI 03-04-1933 [id number]   Medication Adherence call to Mrs. Burden Compliant Voice message left with a call back number. Mrs. Dilday is showing past due on Pravastatin 20 mg under [place].   [place] Management Direct Dial [phone]  Fax [phone] [email or web address]

## 2019-03-31 ENCOUNTER — Encounter: Payer: Self-pay | Admitting: Internal Medicine

## 2019-03-31 ENCOUNTER — Other Ambulatory Visit: Payer: Self-pay

## 2019-03-31 ENCOUNTER — Ambulatory Visit (INDEPENDENT_AMBULATORY_CARE_PROVIDER_SITE_OTHER): Payer: Medicare Other | Admitting: Internal Medicine

## 2019-03-31 DIAGNOSIS — F5101 Primary insomnia: Secondary | ICD-10-CM

## 2019-03-31 DIAGNOSIS — R0601 Orthopnea: Secondary | ICD-10-CM

## 2019-03-31 DIAGNOSIS — I5032 Chronic diastolic (congestive) heart failure: Secondary | ICD-10-CM

## 2019-03-31 DIAGNOSIS — R600 Localized edema: Secondary | ICD-10-CM | POA: Diagnosis not present

## 2019-03-31 DIAGNOSIS — E538 Deficiency of other specified B group vitamins: Secondary | ICD-10-CM

## 2019-03-31 MED ORDER — ZOLPIDEM TARTRATE 5 MG PO TABS: 2.5000 mg | ORAL_TABLET | Freq: Every evening | ORAL | 2 refills | Status: DC | PRN

## 2019-03-31 MED ORDER — CYANOCOBALAMIN 1000 MCG/ML IJ SOLN
1000.0000 ug | Freq: Once | INTRAMUSCULAR | Status: AC
  Administered 2019-03-31: 11:00:00 1000 ug via INTRAMUSCULAR

## 2019-03-31 MED ORDER — FUROSEMIDE 40 MG PO TABS: 20.0000 mg | ORAL_TABLET | Freq: Every day | ORAL | 3 refills | Status: DC | PRN

## 2019-03-31 NOTE — Assessment & Plan Note (Signed)
Furosemide prn 

## 2019-03-31 NOTE — Addendum Note (Signed)
Addended by: Cresenciano Lick on: 03/31/2019 10:57 AM   Modules accepted: Orders

## 2019-03-31 NOTE — Assessment & Plan Note (Signed)
No relapse 

## 2019-03-31 NOTE — Progress Notes (Signed)
Subjective:  Patient ID: [name] [name], female    DOB: [birth year]  Age: 85 y.o. MRN: [id number]  CC: No chief complaint on file.   HPI LEATHER ESTIS presents for severe insomnia and anxiety x 1 week C/o panic attacks type of sx's F/u B12 def C/o SOB in bed  Outpatient Medications Prior to Visit  Medication Sig Dispense Refill  . acetaminophen (TYLENOL) 500 MG tablet Take 500 mg by mouth 2 (two) times daily as needed for moderate pain.     Marland Kitchen amLODipine (NORVASC) 10 MG tablet Take 0.5 tablets (5 mg total) by mouth daily. 90 tablet 3  . [facility] B-12 500 MCG SUBL Place 2 tablets (1,000 mcg total) under the tongue daily. 200 tablet 3  . diphenhydrAMINE (SOMINEX) 25 MG tablet Take 25 mg by mouth at bedtime.     Marland Kitchen ELIQUIS 5 MG TABS tablet TAKE 1 TABLET BY MOUTH TWICE DAILY 180 tablet 1  . furosemide (LASIX) 40 MG tablet One tablet by mouth daily for 3 days and then as needed for swelling or shortness of breath. 30 tablet 1  . hydrocortisone 2.5 % ointment Apply topically 2 (two) times daily. 30 g 3  . loratadine (CLARITIN) 10 MG tablet Take 1 tablet (10 mg total) by mouth daily. 100 tablet 11  . losartan (COZAAR) 100 MG tablet Take 1 tablet (100 mg total) by mouth daily. 90 tablet 3  . metoprolol tartrate (LOPRESSOR) 25 MG tablet Take 1 tablet (25 mg total) by mouth as needed. For palpation 30 tablet 6  . Multiple Vitamin (MULTIVITAMIN WITH MINERALS) TABS tablet Take 1 tablet by mouth daily.    . pantoprazole (PROTONIX) 40 MG tablet Take 1 tablet (40 mg total) by mouth 2 (two) times daily. 60 tablet 5  . potassium chloride SA (KLOR-CON M20) 20 MEQ tablet Take 1 tablet (20 mEq total) by mouth 2 (two) times daily. 180 tablet 3  . pravastatin (PRAVACHOL) 20 MG tablet Take 1 tablet (20 mg total) by mouth daily. 90 tablet 3   No facility-administered medications prior to visit.     ROS: Review of Systems  Constitutional: Negative for activity change, appetite change, chills, fatigue and  unexpected weight change.  HENT: Negative for congestion, mouth sores and sinus pressure.   Eyes: Negative for visual disturbance.  Respiratory: Positive for shortness of breath. Negative for cough and chest tightness.   Gastrointestinal: Negative for abdominal pain and nausea.  Genitourinary: Negative for difficulty urinating, frequency and vaginal pain.  Musculoskeletal: Negative for back pain and gait problem.  Skin: Negative for pallor and rash.  Neurological: Negative for dizziness, tremors, weakness, numbness and headaches.  Psychiatric/Behavioral: Positive for sleep disturbance. Negative for confusion. The patient is nervous/anxious.     Objective:  BP 140/80 (BP Location: Left Arm, Patient Position: Sitting, Cuff Size: Normal)   Pulse (!) 58   Temp 98.5 F (36.9 C) (Oral)   Ht 5\' 2"  (1.575 m)   Wt 191 lb (86.6 kg)   SpO2 95%   BMI 34.93 kg/m   BP Readings from Last 3 Encounters:  03/31/19 140/80  02/28/19 126/77  02/02/19 116/72    Wt Readings from Last 3 Encounters:  03/31/19 191 lb (86.6 kg)  02/28/19 192 lb 3.2 oz (87.2 kg)  02/02/19 194 lb (88 kg)    Physical Exam Constitutional:      General: She is not in acute distress.    Appearance: She is well-developed.  HENT:  Head: Normocephalic.     Right Ear: External ear normal.     Left Ear: External ear normal.     Nose: Nose normal.  Eyes:     General:        Right eye: No discharge.        Left eye: No discharge.     Conjunctiva/sclera: Conjunctivae normal.     Pupils: Pupils are equal, round, and reactive to light.  Neck:     Musculoskeletal: Normal range of motion and neck supple.     Thyroid: No thyromegaly.     Vascular: No JVD.     Trachea: No tracheal deviation.  Cardiovascular:     Rate and Rhythm: Rhythm irregular.     Heart sounds: Normal heart sounds.  Pulmonary:     Effort: No respiratory distress.     Breath sounds: No stridor. No wheezing, rhonchi or rales.  Abdominal:      General: Bowel sounds are normal. There is no distension.     Palpations: Abdomen is soft. There is no mass.     Tenderness: There is no abdominal tenderness. There is no guarding or rebound.  Musculoskeletal:        General: No swelling or tenderness.     Right lower leg: No edema.     Left lower leg: No edema.  Lymphadenopathy:     Cervical: No cervical adenopathy.  Skin:    Findings: No erythema or rash.  Neurological:     Cranial Nerves: No cranial nerve deficit.     Motor: No abnormal muscle tone.     Coordination: Coordination normal.     Gait: Gait abnormal.     Deep Tendon Reflexes: Reflexes normal.  Psychiatric:        Behavior: Behavior normal.        Thought Content: Thought content normal.        Judgment: Judgment normal.   No JVD cane Lab Results  Component Value Date   WBC 4.5 02/02/2019   HGB 13.5 02/02/2019   HCT 40.2 02/02/2019   PLT 229.0 02/02/2019   GLUCOSE 98 02/02/2019   CHOL 229 (H) 04/21/2018   TRIG 75.0 04/21/2018   HDL 71.80 04/21/2018   LDLCALC 142 (H) 04/21/2018   ALT 32 02/02/2019   AST 24 02/02/2019   NA 139 02/02/2019   K 4.0 02/02/2019   CL 101 02/02/2019   CREATININE 1.03 02/02/2019   BUN 10 02/02/2019   CO2 29 02/02/2019   TSH 1.62 02/02/2019   INR 1.67 (H) 05/27/2014   HGBA1C 5.6 12/29/2014    Ct Head Wo Contrast  Result Date: 12/22/2018 CLINICAL DATA:  History of breast carcinoma.  Loss of sense of taste EXAM: CT HEAD WITHOUT CONTRAST TECHNIQUE: Contiguous axial images were obtained from the base of the skull through the vertex without intravenous contrast. COMPARISON:  None. FINDINGS: Brain: There is age related volume loss. There is no intracranial mass, hemorrhage, extra-axial fluid collection, or midline shift. There is mild patchy small vessel disease in the centra semiovale bilaterally. No acute infarct is demonstrable on this study. Vascular: There is no appreciable hyperdense vessel. There is calcification in each carotid  siphon region. Skull: The bony calvarium appears intact. Sinuses/Orbits: Visualized paranasal sinuses are clear. Visualized orbits appear symmetric bilaterally. Other: Visualized mastoid air cells are clear. IMPRESSION: Age related volume loss with mild patchy periventricular small vessel disease. No acute infarct. No mass or hemorrhage. There are foci of arterial vascular calcification. Electronically  Signed   By: Lowella Grip III M.D.   On: 12/22/2018 14:21    Assessment & Plan:   There are no diagnoses linked to this encounter.   No orders of the defined types were placed in this encounter.    Follow-up: No follow-ups on file.  Walker Kehr, MD

## 2019-03-31 NOTE — Assessment & Plan Note (Signed)
Low dose Zolpidem prn  Potential benefits of a long term benzodiazepines  use as well as potential risks  and complications were explained to the patient and were aknowledged. Treat ?CHF

## 2019-03-31 NOTE — Assessment & Plan Note (Signed)
On B12 

## 2019-03-31 NOTE — Assessment & Plan Note (Addendum)
Start Furosemide prn

## 2019-04-02 DIAGNOSIS — I5042 Chronic combined systolic (congestive) and diastolic (congestive) heart failure: Secondary | ICD-10-CM

## 2019-04-02 DIAGNOSIS — I420 Dilated cardiomyopathy: Secondary | ICD-10-CM

## 2019-04-02 HISTORY — DX: Dilated cardiomyopathy: I42.0

## 2019-04-02 HISTORY — DX: Chronic combined systolic (congestive) and diastolic (congestive) heart failure: I50.42

## 2019-04-09 ENCOUNTER — Other Ambulatory Visit: Payer: Self-pay

## 2019-04-09 ENCOUNTER — Inpatient Hospital Stay (HOSPITAL_COMMUNITY)
Admission: EM | Admit: 2019-04-09 | Discharge: 2019-04-15 | DRG: 291 | Disposition: A | Discharge status: Home / Self Care | Payer: Medicare Other | Attending: Internal Medicine | Admitting: Internal Medicine

## 2019-04-09 ENCOUNTER — Emergency Department (HOSPITAL_COMMUNITY): Payer: Medicare Other

## 2019-04-09 DIAGNOSIS — I248 Other forms of acute ischemic heart disease: Secondary | ICD-10-CM | POA: Diagnosis present

## 2019-04-09 DIAGNOSIS — Z881 Allergy status to other antibiotic agents status: Secondary | ICD-10-CM

## 2019-04-09 DIAGNOSIS — E785 Hyperlipidemia, unspecified: Secondary | ICD-10-CM | POA: Diagnosis not present

## 2019-04-09 DIAGNOSIS — Z20828 Contact with and (suspected) exposure to other viral communicable diseases: Secondary | ICD-10-CM | POA: Diagnosis present

## 2019-04-09 DIAGNOSIS — I130 Hypertensive heart and chronic kidney disease with heart failure and stage 1 through stage 4 chronic kidney disease, or unspecified chronic kidney disease: Principal | ICD-10-CM | POA: Diagnosis present

## 2019-04-09 DIAGNOSIS — E559 Vitamin D deficiency, unspecified: Secondary | ICD-10-CM | POA: Diagnosis present

## 2019-04-09 DIAGNOSIS — Z79899 Other long term (current) drug therapy: Secondary | ICD-10-CM | POA: Diagnosis not present

## 2019-04-09 DIAGNOSIS — I083 Combined rheumatic disorders of mitral, aortic and tricuspid valves: Secondary | ICD-10-CM | POA: Diagnosis present

## 2019-04-09 DIAGNOSIS — E538 Deficiency of other specified B group vitamins: Secondary | ICD-10-CM | POA: Diagnosis present

## 2019-04-09 DIAGNOSIS — I4821 Permanent atrial fibrillation: Secondary | ICD-10-CM | POA: Diagnosis not present

## 2019-04-09 DIAGNOSIS — R634 Abnormal weight loss: Secondary | ICD-10-CM | POA: Diagnosis present

## 2019-04-09 DIAGNOSIS — R0689 Other abnormalities of breathing: Secondary | ICD-10-CM | POA: Diagnosis not present

## 2019-04-09 DIAGNOSIS — I2510 Atherosclerotic heart disease of native coronary artery without angina pectoris: Secondary | ICD-10-CM | POA: Diagnosis present

## 2019-04-09 DIAGNOSIS — E876 Hypokalemia: Secondary | ICD-10-CM | POA: Diagnosis not present

## 2019-04-09 DIAGNOSIS — K219 Gastro-esophageal reflux disease without esophagitis: Secondary | ICD-10-CM | POA: Diagnosis not present

## 2019-04-09 DIAGNOSIS — Z90710 Acquired absence of both cervix and uterus: Secondary | ICD-10-CM

## 2019-04-09 DIAGNOSIS — I10 Essential (primary) hypertension: Secondary | ICD-10-CM | POA: Diagnosis not present

## 2019-04-09 DIAGNOSIS — R7989 Other specified abnormal findings of blood chemistry: Secondary | ICD-10-CM | POA: Diagnosis not present

## 2019-04-09 DIAGNOSIS — I5023 Acute on chronic systolic (congestive) heart failure: Secondary | ICD-10-CM | POA: Diagnosis not present

## 2019-04-09 DIAGNOSIS — R040 Epistaxis: Secondary | ICD-10-CM | POA: Diagnosis not present

## 2019-04-09 DIAGNOSIS — Z853 Personal history of malignant neoplasm of breast: Secondary | ICD-10-CM | POA: Diagnosis not present

## 2019-04-09 DIAGNOSIS — R778 Other specified abnormalities of plasma proteins: Secondary | ICD-10-CM | POA: Diagnosis present

## 2019-04-09 DIAGNOSIS — Z85828 Personal history of other malignant neoplasm of skin: Secondary | ICD-10-CM | POA: Diagnosis not present

## 2019-04-09 DIAGNOSIS — I5042 Chronic combined systolic (congestive) and diastolic (congestive) heart failure: Secondary | ICD-10-CM | POA: Diagnosis present

## 2019-04-09 DIAGNOSIS — N183 Chronic kidney disease, stage 3 unspecified: Secondary | ICD-10-CM | POA: Diagnosis present

## 2019-04-09 DIAGNOSIS — R000 Tachycardia, unspecified: Secondary | ICD-10-CM | POA: Diagnosis not present

## 2019-04-09 DIAGNOSIS — I447 Left bundle-branch block, unspecified: Secondary | ICD-10-CM | POA: Diagnosis not present

## 2019-04-09 DIAGNOSIS — I361 Nonrheumatic tricuspid (valve) insufficiency: Secondary | ICD-10-CM | POA: Diagnosis not present

## 2019-04-09 DIAGNOSIS — Z8249 Family history of ischemic heart disease and other diseases of the circulatory system: Secondary | ICD-10-CM

## 2019-04-09 DIAGNOSIS — D151 Benign neoplasm of heart: Secondary | ICD-10-CM | POA: Diagnosis not present

## 2019-04-09 DIAGNOSIS — I340 Nonrheumatic mitral (valve) insufficiency: Secondary | ICD-10-CM | POA: Diagnosis not present

## 2019-04-09 DIAGNOSIS — R931 Abnormal findings on diagnostic imaging of heart and coronary circulation: Secondary | ICD-10-CM

## 2019-04-09 DIAGNOSIS — Z886 Allergy status to analgesic agent status: Secondary | ICD-10-CM

## 2019-04-09 DIAGNOSIS — R079 Chest pain, unspecified: Secondary | ICD-10-CM | POA: Diagnosis present

## 2019-04-09 DIAGNOSIS — Z888 Allergy status to other drugs, medicaments and biological substances status: Secondary | ICD-10-CM

## 2019-04-09 DIAGNOSIS — Z9013 Acquired absence of bilateral breasts and nipples: Secondary | ICD-10-CM

## 2019-04-09 DIAGNOSIS — Z7901 Long term (current) use of anticoagulants: Secondary | ICD-10-CM

## 2019-04-09 DIAGNOSIS — R0789 Other chest pain: Secondary | ICD-10-CM | POA: Diagnosis not present

## 2019-04-09 DIAGNOSIS — R600 Localized edema: Secondary | ICD-10-CM | POA: Diagnosis present

## 2019-04-09 DIAGNOSIS — Z809 Family history of malignant neoplasm, unspecified: Secondary | ICD-10-CM

## 2019-04-09 DIAGNOSIS — I5189 Other ill-defined heart diseases: Secondary | ICD-10-CM | POA: Diagnosis present

## 2019-04-09 DIAGNOSIS — R0602 Shortness of breath: Secondary | ICD-10-CM | POA: Diagnosis not present

## 2019-04-09 DIAGNOSIS — T502X5A Adverse effect of carbonic-anhydrase inhibitors, benzothiadiazides and other diuretics, initial encounter: Secondary | ICD-10-CM | POA: Diagnosis not present

## 2019-04-09 DIAGNOSIS — I959 Hypotension, unspecified: Secondary | ICD-10-CM | POA: Diagnosis not present

## 2019-04-09 DIAGNOSIS — Z96651 Presence of right artificial knee joint: Secondary | ICD-10-CM | POA: Diagnosis present

## 2019-04-09 LAB — CBC
HCT: 39.2 % (ref 36.0–46.0)
Hemoglobin: 12.8 g/dL (ref 12.0–15.0)
MCH: 28.1 pg (ref 26.0–34.0)
MCHC: 32.7 g/dL (ref 30.0–36.0)
MCV: 86 fL (ref 80.0–100.0)
Platelets: 211 10*3/uL (ref 150–400)
RBC: 4.56 MIL/uL (ref 3.87–5.11)
RDW: 15.8 % — ABNORMAL HIGH (ref 11.5–15.5)
WBC: 5.4 10*3/uL (ref 4.0–10.5)
nRBC: 0 % (ref 0.0–0.2)

## 2019-04-09 LAB — BASIC METABOLIC PANEL
Anion gap: 12 (ref 5–15)
BUN: 10 mg/dL (ref 8–23)
CO2: 26 mmol/L (ref 22–32)
Calcium: 9.9 mg/dL (ref 8.9–10.3)
Chloride: 102 mmol/L (ref 98–111)
Creatinine, Ser: 1.01 mg/dL — ABNORMAL HIGH (ref 0.44–1.00)
GFR calc Af Amer: 59 mL/min — ABNORMAL LOW (ref >60)
GFR calc non Af Amer: 51 mL/min — ABNORMAL LOW (ref >60)
Glucose, Bld: 106 mg/dL — ABNORMAL HIGH (ref 70–99)
Potassium: 3.7 mmol/L (ref 3.5–5.1)
Sodium: 140 mmol/L (ref 135–145)

## 2019-04-09 LAB — URINALYSIS, ROUTINE W REFLEX MICROSCOPIC
Bacteria, UA: NONE SEEN
Bilirubin Urine: NEGATIVE
Glucose, UA: NEGATIVE mg/dL
Ketones, ur: NEGATIVE mg/dL
Leukocytes,Ua: NEGATIVE
Nitrite: NEGATIVE
Protein, ur: NEGATIVE mg/dL
Specific Gravity, Urine: 1.006 (ref 1.005–1.030)
pH: 5 (ref 5.0–8.0)

## 2019-04-09 LAB — HEPATIC FUNCTION PANEL
ALT: 28 U/L (ref 0–44)
AST: 33 U/L (ref 15–41)
Albumin: 3.7 g/dL (ref 3.5–5.0)
Alkaline Phosphatase: 61 U/L (ref 38–126)
Bilirubin, Direct: 0.1 mg/dL (ref 0.0–0.2)
Indirect Bilirubin: 0.5 mg/dL (ref 0.3–0.9)
Total Bilirubin: 0.6 mg/dL (ref 0.3–1.2)
Total Protein: 7 g/dL (ref 6.5–8.1)

## 2019-04-09 LAB — BRAIN NATRIURETIC PEPTIDE: B Natriuretic Peptide: 1385.5 pg/mL — ABNORMAL HIGH (ref 0.0–100.0)

## 2019-04-09 LAB — D-DIMER, QUANTITATIVE: D-Dimer, Quant: 1.29 ug/mL-FEU — ABNORMAL HIGH (ref 0.00–0.50)

## 2019-04-09 LAB — TROPONIN I (HIGH SENSITIVITY)
Troponin I (High Sensitivity): 26 ng/L — ABNORMAL HIGH (ref <18)
Troponin I (High Sensitivity): 26 ng/L — ABNORMAL HIGH (ref <18)

## 2019-04-09 LAB — TSH: TSH: 2.691 u[IU]/mL (ref 0.350–4.500)

## 2019-04-09 LAB — SARS CORONAVIRUS 2 BY RT PCR (HOSPITAL ORDER, PERFORMED IN ~~LOC~~ HOSPITAL LAB): SARS Coronavirus 2: NEGATIVE

## 2019-04-09 MED ORDER — SODIUM CHLORIDE 0.9% FLUSH: 3.0000 mL | Freq: Once | INTRAVENOUS | Status: DC

## 2019-04-09 MED ORDER — AMLODIPINE BESYLATE 5 MG PO TABS: 5.0000 mg | ORAL_TABLET | Freq: Every day | ORAL | Status: DC

## 2019-04-09 MED ORDER — NITROGLYCERIN 0.4 MG SL SUBL
0.4000 mg | SUBLINGUAL_TABLET | SUBLINGUAL | Status: DC | PRN
  Administered 2019-04-09: 19:00:00 0.4 mg via SUBLINGUAL
  Filled 2019-04-09: qty 1

## 2019-04-09 MED ORDER — PANTOPRAZOLE SODIUM 40 MG PO TBEC
40.0000 mg | DELAYED_RELEASE_TABLET | Freq: Two times a day (BID) | ORAL | Status: DC
  Administered 2019-04-10 – 2019-04-15 (×12): 40 mg via ORAL
  Filled 2019-04-09 (×14): qty 1

## 2019-04-09 MED ORDER — FUROSEMIDE 10 MG/ML IJ SOLN
40.0000 mg | Freq: Two times a day (BID) | INTRAMUSCULAR | Status: DC
  Administered 2019-04-10 – 2019-04-13 (×7): 40 mg via INTRAVENOUS
  Filled 2019-04-09 (×8): qty 4

## 2019-04-09 MED ORDER — LEVALBUTEROL TARTRATE 45 MCG/ACT IN AERO
2.0000 | INHALATION_SPRAY | Freq: Four times a day (QID) | RESPIRATORY_TRACT | Status: DC | PRN
  Filled 2019-04-09: qty 15

## 2019-04-09 MED ORDER — APIXABAN 5 MG PO TABS
5.0000 mg | ORAL_TABLET | Freq: Two times a day (BID) | ORAL | Status: DC
  Administered 2019-04-10 (×2): 5 mg via ORAL
  Filled 2019-04-09 (×2): qty 1

## 2019-04-09 MED ORDER — DIPHENHYDRAMINE HCL 25 MG PO CAPS
25.0000 mg | ORAL_CAPSULE | Freq: Every day | ORAL | Status: DC
  Administered 2019-04-10 – 2019-04-14 (×6): 25 mg via ORAL
  Filled 2019-04-09 (×6): qty 1

## 2019-04-09 MED ORDER — ADULT MULTIVITAMIN W/MINERALS CH
1.0000 | ORAL_TABLET | Freq: Every day | ORAL | Status: DC
  Administered 2019-04-10 – 2019-04-15 (×6): 1 via ORAL
  Filled 2019-04-09 (×6): qty 1

## 2019-04-09 MED ORDER — DM-GUAIFENESIN ER 30-600 MG PO TB12: 1.0000 | ORAL_TABLET | Freq: Two times a day (BID) | ORAL | Status: DC | PRN

## 2019-04-09 MED ORDER — ZOLPIDEM TARTRATE 5 MG PO TABS
2.5000 mg | ORAL_TABLET | Freq: Every evening | ORAL | Status: DC | PRN
  Administered 2019-04-10 – 2019-04-14 (×5): 2.5 mg via ORAL
  Filled 2019-04-09 (×5): qty 1

## 2019-04-09 MED ORDER — HYDRALAZINE HCL 20 MG/ML IJ SOLN: 5.0000 mg | INTRAMUSCULAR | Status: DC | PRN

## 2019-04-09 MED ORDER — ACETAMINOPHEN 325 MG PO TABS
650.0000 mg | ORAL_TABLET | ORAL | Status: DC | PRN
  Administered 2019-04-10: 650 mg via ORAL
  Filled 2019-04-09: qty 2

## 2019-04-09 MED ORDER — MORPHINE SULFATE (PF) 2 MG/ML IV SOLN: 0.5000 mg | INTRAVENOUS | Status: DC | PRN

## 2019-04-09 MED ORDER — ONDANSETRON HCL 4 MG/2ML IJ SOLN: 4.0000 mg | Freq: Four times a day (QID) | INTRAMUSCULAR | Status: DC | PRN

## 2019-04-09 MED ORDER — LOSARTAN POTASSIUM 50 MG PO TABS
100.0000 mg | ORAL_TABLET | Freq: Every day | ORAL | Status: DC
  Administered 2019-04-10 – 2019-04-11 (×2): 100 mg via ORAL
  Filled 2019-04-09 (×2): qty 2

## 2019-04-09 NOTE — ED Triage Notes (Signed)
Per [facility], Pt from home. Pt reports generalized weakness and SHOB x 2 days. Pt reports cough today. Denies fever. Pt reported CP during EMS transport, has hx of A-fib. Pt received 1 nitro en route.

## 2019-04-09 NOTE — ED Provider Notes (Signed)
[facility] EMERGENCY DEPARTMENT Provider Note   CSN: [id number] Arrival date & time: 04/09/19  1719    History   Chief Complaint Chief Complaint  Patient presents with  . Weakness    HPI [name] [name] is a 85 y.o. female history of breast cancer, skin cancer, atrial fibrillation on Eliquis, HTN, and HLD presents to the ED complaining of generalized weakness, shortness of breath, and cough over the past 2 days.  Patient reports associated white sputum production today and left-sided chest pain.  Patient describes her chest pain as tightness that is intermittent and worse with movement and deep breaths.  Patient reports her shortness of breath is worse at night when she lies flat.  She is also complaining of chills but denies any fever.  Patient reports she lives at home alone, has had no visitors, and has worn a mask when out in public.  She denies any known COVID-19 positive contacts.  She is complaining of increased leg swelling recently for which she recently had her Lasix dose increased.  She reports that since November, her health has been steadily declining and she has lost over 70 pounds unintentionally.  She reports she is also lost her sensation of taste over this time.  She denies any recent abdominal pain, nausea, vomiting, diarrhea, dysuria, or other complaints.    The history is provided by the patient.    Past Medical History:  Diagnosis Date  . Acute lymphadenitis of arm   . Anemia    iron deficiency  . Atrial fibrillation ([place])   . Breast cancer ([place])   . Depression 2009   grief  . Diverticulosis of colon 2004   Dr Deatra Ina  . GERD (gastroesophageal reflux disease)   . Heart murmur   . Helicobacter pylori gastritis   . Hematuria    Dr Terance Hart  . Hyperlipidemia   . Hypertension   . Osteoarthritis, knee   . Skin cancer   . Vitamin B12 deficiency   . Vitamin D deficiency     Patient Active Problem List   Diagnosis Date Noted  . Chest  pain 04/09/2019  . Elevated troponin 04/09/2019  . CKD (chronic kidney disease), stage III ([place]) 04/09/2019  . Acute on chronic systolic CHF (congestive heart failure) ([place]) 03/08/2019  . Orthopnea 02/28/2019  . Edema of both lower extremities 02/28/2019  . Dysphagia 09/28/2018  . Anosmia 08/18/2018  . Sweats, menopausal 08/18/2018  . Urinary incontinence 08/18/2018  . UTI (urinary tract infection) due to Enterococcus 08/06/2018  . Chest discomfort 08/04/2018  . Murmur, cardiac 07/28/2018  . Permanent atrial fibrillation:  CHA2DS2-VASc Score ([place]) 07/28/2018  . Sciatica associated with disorder of lumbar spine 02/26/2018  . Hip pain, chronic, left 02/26/2018  . Acute pharyngitis 07/13/2017  . Actinic keratosis 07/13/2017  . Wart viral 07/01/2017  . Edema 12/19/2016  . Right leg pain 11/03/2016  . Knee pain, left 07/17/2016  . Bell palsy 10/22/2015  . Taste sense altered 10/22/2015  . Mouth droop due to facial weakness 10/12/2015  . Sialoadenitis of submandibular gland 10/04/2015  . Fatigue 10/04/2015  . Cold intolerance 04/23/2015  . Headache 12/29/2014  . Total knee replacement status 05/24/2014  . Lymphedema of upper extremity following lymphadenectomy 05/22/2014  . Preop exam for internal medicine 05/22/2014  . Breast cancer ([place]) 03/17/2014  . Chronic cholecystitis 11/15/2013  . Abnormal gallbladder x-ray 11/08/2013  . Abdominal tenderness of left lower quadrant 11/02/2013  . Diverticulitis of colon without hemorrhage 11/02/2013  .  Hematuria 11/02/2013  . Hypokalemia 11/02/2013  . Obstructive chronic bronchitis with exacerbation ([place]) 09/05/2013  . Mass of right forearm 02/07/2013  . Well adult exam 02/04/2013  . Lipoma of forearm 02/04/2013  . Mass of left side of neck 01/06/2012  . Diverticulitis 10/10/2011  . Nausea & vomiting 04/16/2011  . Chills 04/16/2011  . LLQ abdominal pain 04/16/2011  . Abdominal pain, epigastric 08/14/2010  . GERD 06/27/2010  .  SKIN CANCER, HX OF 01/31/2010  . CHILLS WITHOUT FEVER 05/04/2009  . Rash and nonspecific skin eruption 11/21/2008  . Herpes zoster 10/04/2008  . PARESTHESIA 10/04/2008  . GRIEF REACTION 08/15/2008  . Depression 08/15/2008  . Osteoarthritis 08/15/2008  . HYPOKALEMIA 12/22/2007  . Weight loss 10/21/2007  . Pain in Soft Tissues of Limb 06/21/2007  . B12 deficiency 03/27/2007  . Vitamin D deficiency 03/27/2007  . Hyperlipidemia with target LDL less than 100 03/27/2007  . ANEMIA-IRON DEFICIENCY 03/27/2007  . Insomnia 03/27/2007  . Essential hypertension 03/27/2007  . BRONCHITIS, ACUTE 03/27/2007  . DIVERTICULOSIS, COLON 03/27/2007    Past Surgical History:  Procedure Laterality Date  . ABDOMINAL HYSTERECTOMY    . CARDIOVERSION N/A 09/06/2018   Procedure: CARDIOVERSION;  Surgeon: Fay Records, MD;  Location: [facility] ENDOSCOPY;  Service: Cardiovascular;  Laterality: N/A; --> after initial success with sinus rhythm, the patient went back into A. fib within 2 minutes.  . CORONARY CT ANGIOGRAM  09/14/2018   Coronary Calcium Score 276.  Mild mixed plaque in the left main with minimal stenosis.  Mixed plaque in the ostial LAD, proximal LCx and moderate size OM1, as well as proximal RCA (<50% stenosis throughout.).  Nonobstructive CAD.  Intermediate risk  . JOINT REPLACEMENT    . L groin skin cancer  2011  . MASTECTOMY  09-13-08   left and right  . TOTAL KNEE ARTHROPLASTY  06-04-98  . TOTAL KNEE ARTHROPLASTY Right 05/24/2014   Procedure: RIGHT TOTAL KNEE ARTHROPLASTY;  Surgeon: Newt Minion, MD;  Location: [facility];  Service: Orthopedics;  Laterality: Right;  . TRANSTHORACIC ECHOCARDIOGRAM  08/11/2018   Persistent A. fib: Mildly reduced EF of 45 to 50% but diffuse HK.  If A. fib the entire time.  Moderate LA and moderate to severe RA dilation (suggesting longstanding A. fib, and potentially explaining difficulty cardioversion).  No significant valve disease.  Aortic sclerosis no stenosis.  Unable to  assess diastolic function with A. fib     OB History   No obstetric history on file.      Home Medications    Prior to Admission medications   Medication Sig Start Date End Date Taking? Authorizing Provider  acetaminophen (TYLENOL) 500 MG tablet Take 500 mg by mouth 2 (two) times daily as needed for moderate pain.    Yes [provider]  amLODipine (NORVASC) 10 MG tablet Take 0.5 tablets (5 mg total) by mouth daily. 12/20/18  Yes Plotnikov, Evie Lacks, MD  [facility] B-12 500 MCG SUBL Place 2 tablets (1,000 mcg total) under the tongue daily. 04/21/18  Yes Plotnikov, Evie Lacks, MD  diphenhydrAMINE (SOMINEX) 25 MG tablet Take 25 mg by mouth at bedtime.    Yes [provider]  ELIQUIS 5 MG TABS tablet TAKE 1 TABLET BY MOUTH TWICE DAILY Patient taking differently: Take 5 mg by mouth 2 (two) times daily.  10/26/18  Yes Leonie Man, MD  furosemide (LASIX) 40 MG tablet Take 0.5-1 tablets (20-40 mg total) by mouth daily as needed for fluid or edema (  shortness of breath). One tablet by mouth daily for 3 days and then as needed for swelling or shortness of breath. 03/31/19  Yes Plotnikov, Evie Lacks, MD  losartan (COZAAR) 100 MG tablet Take 1 tablet (100 mg total) by mouth daily. 04/21/18  Yes Plotnikov, Evie Lacks, MD  metoprolol tartrate (LOPRESSOR) 25 MG tablet Take 1 tablet (25 mg total) by mouth as needed. For palpation 02/28/19 05/29/19 Yes Leonie Man, MD  Multiple Vitamin (MULTIVITAMIN WITH MINERALS) TABS tablet Take 1 tablet by mouth daily.   Yes [provider]  pantoprazole (PROTONIX) 40 MG tablet Take 1 tablet (40 mg total) by mouth 2 (two) times daily. 09/28/18  Yes Plotnikov, Evie Lacks, MD  potassium chloride SA (KLOR-CON M20) 20 MEQ tablet Take 1 tablet (20 mEq total) by mouth 2 (two) times daily. 09/28/18  Yes Plotnikov, Evie Lacks, MD  zolpidem (AMBIEN) 5 MG tablet Take 0.5-1 tablets (2.5-5 mg total) by mouth at bedtime as needed for sleep (insomnia). 03/31/19  Yes  Plotnikov, Evie Lacks, MD  loratadine (CLARITIN) 10 MG tablet Take 1 tablet (10 mg total) by mouth daily. Patient not taking: Reported on 04/09/2019 12/20/18   Plotnikov, Evie Lacks, MD  pravastatin (PRAVACHOL) 20 MG tablet Take 1 tablet (20 mg total) by mouth daily. Patient not taking: Reported on 04/09/2019 07/19/18 07/19/19  Plotnikov, Evie Lacks, MD    Family History Family History  Problem Relation Age of Onset  . Cancer Sister        breast  . Cancer Maternal Aunt        breast  . Coronary artery disease Other   . Cancer Other        aunt, niece  . Atrial fibrillation Other     Social History Social History   Tobacco Use  . Smoking status: Never Smoker  . Smokeless tobacco: Never Used  Substance Use Topics  . Alcohol use: No  . Drug use: No     Allergies   Anastrozole, Aspirin, Ciprofloxacin, and Letrozole   Review of Systems Review of Systems  Constitutional: Positive for chills and unexpected weight change. Negative for fever.  HENT: Negative for ear pain and sore throat.   Eyes: Negative for pain and visual disturbance.  Respiratory: Positive for cough and shortness of breath.   Cardiovascular: Positive for chest pain. Negative for palpitations.  Gastrointestinal: Negative for abdominal pain and vomiting.  Genitourinary: Negative for dysuria and hematuria.  Musculoskeletal: Negative for arthralgias and back pain.  Skin: Negative for color change and rash.  Neurological: Negative for seizures and syncope.  Psychiatric/Behavioral: The patient is nervous/anxious.   All other systems reviewed and are negative.    Physical Exam Updated Vital Signs BP (!) 133/91 (BP Location: Left Arm)   Pulse (!) 101   Temp 98.9 F (37.2 C) (Oral)   Resp 20   Ht 5\' 6"  (1.676 m)   Wt 83.7 kg   SpO2 97%   BMI 29.78 kg/m   Physical Exam Vitals signs and nursing note reviewed.  Constitutional:      General: She is not in acute distress.    Appearance: Normal appearance.  She is not ill-appearing, toxic-appearing or diaphoretic.  HENT:     Head: Normocephalic and atraumatic.     Nose: Nose normal. No congestion or rhinorrhea.     Mouth/Throat:     Mouth: Mucous membranes are moist.     Pharynx: Oropharynx is clear. No oropharyngeal exudate or posterior oropharyngeal erythema.  Eyes:  Extraocular Movements: Extraocular movements intact.     Conjunctiva/sclera: Conjunctivae normal.     Pupils: Pupils are equal, round, and reactive to light.  Neck:     Musculoskeletal: Normal range of motion and neck supple. No neck rigidity or muscular tenderness.  Cardiovascular:     Rate and Rhythm: Tachycardia present. Rhythm irregular.     Pulses: Normal pulses.     Heart sounds: Normal heart sounds. No murmur. No friction rub. No gallop.   Pulmonary:     Effort: Pulmonary effort is normal. No respiratory distress.     Breath sounds: Normal breath sounds. No stridor. No wheezing, rhonchi or rales.  Chest:     Chest wall: No tenderness.  Abdominal:     General: Abdomen is flat. There is no distension.     Palpations: Abdomen is soft.     Tenderness: There is no abdominal tenderness. There is no guarding or rebound.  Musculoskeletal: Normal range of motion.        General: No swelling, tenderness, deformity or signs of injury.  Skin:    General: Skin is warm and dry.  Neurological:     General: No focal deficit present.     Mental Status: She is alert and oriented to person, place, and time. Mental status is at baseline.     Cranial Nerves: No cranial nerve deficit.     Sensory: No sensory deficit.     Motor: No weakness.  Psychiatric:        Mood and Affect: Mood normal.        Behavior: Behavior normal.      ED Treatments / Results  Labs (all labs ordered are listed, but only abnormal results are displayed) Labs Reviewed  BASIC METABOLIC PANEL - Abnormal; Notable for the following components:      Result Value   Glucose, Bld 106 (*)     Creatinine, Ser 1.01 (*)    GFR calc non Af Amer 51 (*)    GFR calc Af Amer 59 (*)    All other components within normal limits  CBC - Abnormal; Notable for the following components:   RDW 15.8 (*)    All other components within normal limits  URINALYSIS, ROUTINE W REFLEX MICROSCOPIC - Abnormal; Notable for the following components:   Hgb urine dipstick MODERATE (*)    All other components within normal limits  BRAIN NATRIURETIC PEPTIDE - Abnormal; Notable for the following components:   B Natriuretic Peptide 1,385.5 (*)    All other components within normal limits  D-DIMER, QUANTITATIVE (NOT AT [facility]) - Abnormal; Notable for the following components:   D-Dimer, Quant 1.29 (*)    All other components within normal limits  TROPONIN I (HIGH SENSITIVITY) - Abnormal; Notable for the following components:   Troponin I (High Sensitivity) 26 (*)    All other components within normal limits  TROPONIN I (HIGH SENSITIVITY) - Abnormal; Notable for the following components:   Troponin I (High Sensitivity) 26 (*)    All other components within normal limits  SARS CORONAVIRUS 2 (HOSPITAL ORDER, [facility])  TSH  HEPATIC FUNCTION PANEL  RAPID URINE DRUG SCREEN, HOSP PERFORMED  HEMOGLOBIN A1C  LIPID PANEL  CBG MONITORING, ED    EKG EKG Interpretation  Date/Time:  Saturday April 09 2019 18:29:54 EDT Ventricular Rate:  104 PR Interval:    QRS Duration: 136 QT Interval:  399 QTC Calculation: 505 R Axis:   -83 Text Interpretation:  Atrial fibrillation Ventricular premature complex IVCD, consider atypical RBBB Anterior infarct, old Abnormal T, consider ischemia, lateral leads Confirmed by Davonna Belling [phone]) on 04/09/2019 7:09:53 PM   Radiology Dg Chest Portable 1 View  Result Date: 04/09/2019 CLINICAL DATA:  Generalized weakness.  Shortness of breath. EXAM: PORTABLE CHEST 1 VIEW COMPARISON:  May 23, 2014 FINDINGS: Stable cardiomegaly. The hila and  mediastinum are normal. No pneumothorax. No pulmonary nodules or masses. No focal infiltrates. No overt edema. Mild pulmonary venous congestion not excluded. IMPRESSION: Cardiomegaly. Possible mild pulmonary venous congestion. No other abnormalities. Electronically Signed   By: Dorise Bullion III M.D   On: 04/09/2019 18:25    Procedures Procedures (including critical care time)  Medications Ordered in ED Medications  sodium chloride flush (NS) 0.9 % injection 3 mL (has no administration in time range)  nitroGLYCERIN (NITROSTAT) SL tablet 0.4 mg (0.4 mg Sublingual Given 04/09/19 1844)  morphine 2 MG/ML injection 0.5 mg (has no administration in time range)  levalbuterol (XOPENEX HFA) inhaler 2 puff (has no administration in time range)  dextromethorphan-guaiFENesin (MUCINEX DM) 30-600 MG per 12 hr tablet 1 tablet (has no administration in time range)  hydrALAZINE (APRESOLINE) injection 5 mg (has no administration in time range)  acetaminophen (TYLENOL) tablet 650 mg (650 mg Oral Given 04/10/19 0002)  ondansetron (ZOFRAN) injection 4 mg (has no administration in time range)  amLODipine (NORVASC) tablet 5 mg (has no administration in time range)  losartan (COZAAR) tablet 100 mg (has no administration in time range)  diphenhydrAMINE (BENADRYL) capsule 25 mg (25 mg Oral Given 04/10/19 0002)  zolpidem (AMBIEN) tablet 2.5 mg (2.5 mg Oral Given 04/10/19 0002)  pantoprazole (PROTONIX) EC tablet 40 mg (has no administration in time range)  apixaban (ELIQUIS) tablet 5 mg (has no administration in time range)  multivitamin with minerals tablet 1 tablet (has no administration in time range)  furosemide (LASIX) injection 40 mg (40 mg Intravenous Given 04/10/19 0002)     Initial Impression / Assessment and Plan / ED Course  I have reviewed the triage vital signs and the nursing notes.  Pertinent labs & imaging results that were available during my care of the patient were reviewed by me and considered in my  medical decision making (see chart for details).     HEAR Score: 4  KAYLIAH TINDOL is a 85 y.o. female history of breast cancer, skin cancer, atrial fibrillation on Eliquis, HTN, and HLD presents to the ED complaining of generalized weakness, shortness of breath, and cough over the past 2 days with left sided chest pain that started today.  On exam, patient is in atrial fibrillation with rates ranging from the 70s-120s.  She has some left sided chest wall tenderness and has no increased work of breathing.  She is satting in the upper 90s on room air.  Chest x-ray was obtained which showed cardiomegaly and possible mild pulmonary venous congestion.  Patient had multiple EKGs while in the ED which showed atrial fibrillation with rates ranging from 84-111 without acute ischemic changes.  CMP and CBC were unremarkable.  Urinalysis no evidence of infection but did show blood on dipstick without significant RBCs, which appears to be chronic.  Patient had recurrence of her chest pain while in the ED for which she was given nitroglycerin with some improvement.  Patient was placed on oxygen by nursing staff and reported improvement of her chest pain and shortness of breath.  She reports that whenever the oxygen is removed her chest  pain worsens and she has difficulty breathing, despite adequate oxygen saturations on the monitor.  HEAR score calculated to be 4.  High sensitivity troponin elevated at 26.  Patient would benefit from observation admission and further cardiac testing.  PE was considered as a cause of patient symptoms given her shortness of breath, tachypnea, and chest pain in the setting of her cancer history.  Patient is already anticoagulated with Eliquis making PE slightly less likely.  D-dimer was ordered to further risk stratify for PE.  The hospitalist will follow up on D-dimer value and will consider CTA chest if it is elevated.  The hospitalist was contacted for admission for  patient's high risk HEAR score with elevated troponin, weight loss, and dyspnea.  Patient will be admitted to the hospitalist service.   Final Clinical Impressions(s) / ED Diagnoses   Final diagnoses:  Shortness of breath  Chest pain, unspecified type    ED Discharge Orders    None       Candie Chroman, MD 04/10/19 [facility] [place]    Davonna Belling, MD 04/10/19 1246

## 2019-04-09 NOTE — H&P (Addendum)
History and Physical    [name] [name] HRC:[id number] DOB: 12-17-1932 DOA: 04/09/2019  Referring MD/NP/PA:   PCP: Cassandria Anger, MD   Patient coming from:  The patient is coming from home.  At baseline, pt is independent for most of ADL.        Chief Complaint: chest pain and SOB  HPI: [name] [name] is a 85 y.o. female with medical history significant of hypertension, hyperlipidemia, GERD, depression, atrial fibrillation on Eliquis, breast cancer (s/p of bilateral mastectomy), sCHF with EF of 45%, CKD stage III, who presents with chest pain and shortness of breath.  Patient states that she has been having shortness of breath for more than 2 days.  She has a dry cough, but no fever or chills.  Today she developed chest pain, which is located in left side of chest, initially 10 out of 10 severity, currently 4 out of 10 in severity, pleuritic, aggravated by deep breaths.  She states that she has a generalized weakness, but no unilateral tingling or numbness in extremities.  No facial droop or slurred speech.  Denies nausea, vomiting, diarrhea, abdominal pain, symptoms of UTI.  She states that she reports that since November, her health has been steadily declining and she has lost over 70 pounds unintentionally.    ED Course: pt was found to have troponin XX 6, WBC 5.4, BNP 1385, negative COVID-19 test, positive D-dimer 1.29, stable renal function, temperature normal, blood pressure 113/84, heart rate 50-1 10, oxygen saturation 94 to 96% on room air.  X-ray showed cardiomegaly and mild vascular congestion.  Patient is placed on telemetry bed for observation.  Review of Systems:   General: no fevers, chills, has body weight gain, has fatigue HEENT: no blurry vision, hearing changes or sore throat Respiratory: has dyspnea, coughing, no wheezing CV: has chest pain, no palpitations GI: no nausea, vomiting, abdominal pain, diarrhea, constipation GU: no dysuria, burning on urination,  increased urinary frequency, hematuria  Ext: has leg edema Neuro: no unilateral weakness, numbness, or tingling, no vision change or hearing loss Skin: no rash, no skin tear. MSK: No muscle spasm, no deformity, no limitation of range of movement in spin Heme: No easy bruising.  Travel history: No recent long distant travel.  Allergy:  Allergies  Allergen Reactions   Anastrozole Other (See Comments)    arthralgias   Aspirin Other (See Comments)    Gi upset    Ciprofloxacin Itching   Letrozole Other (See Comments)    leg pain    Past Medical History:  Diagnosis Date   Acute lymphadenitis of arm    Anemia    iron deficiency   Atrial fibrillation (HCC)    Breast cancer ([place])    Depression 2009   grief   Diverticulosis of colon 2004   Dr Deatra Ina   GERD (gastroesophageal reflux disease)    Heart murmur    Helicobacter pylori gastritis    Hematuria    Dr Terance Hart   Hyperlipidemia    Hypertension    Osteoarthritis, knee    Skin cancer    Vitamin B12 deficiency    Vitamin D deficiency     Past Surgical History:  Procedure Laterality Date   ABDOMINAL HYSTERECTOMY     CARDIOVERSION N/A 09/06/2018   Procedure: CARDIOVERSION;  Surgeon: Fay Records, MD;  Location: [facility] ENDOSCOPY;  Service: Cardiovascular;  Laterality: N/A; --> after initial success with sinus rhythm, the patient went back into A. fib within 2 minutes.  CORONARY CT ANGIOGRAM  09/14/2018   Coronary Calcium Score 276.  Mild mixed plaque in the left main with minimal stenosis.  Mixed plaque in the ostial LAD, proximal LCx and moderate size OM1, as well as proximal RCA (<50% stenosis throughout.).  Nonobstructive CAD.  Intermediate risk   JOINT REPLACEMENT     L groin skin cancer  2011   MASTECTOMY  09-13-08   left and right   TOTAL KNEE ARTHROPLASTY  06-04-98   TOTAL KNEE ARTHROPLASTY Right 05/24/2014   Procedure: RIGHT TOTAL KNEE ARTHROPLASTY;  Surgeon: Newt Minion, MD;  Location: [facility];  Service: Orthopedics;  Laterality: Right;   TRANSTHORACIC ECHOCARDIOGRAM  08/11/2018   Persistent A. fib: Mildly reduced EF of 45 to 50% but diffuse HK.  If A. fib the entire time.  Moderate LA and moderate to severe RA dilation (suggesting longstanding A. fib, and potentially explaining difficulty cardioversion).  No significant valve disease.  Aortic sclerosis no stenosis.  Unable to assess diastolic function with A. fib    Social History:  reports that she has never smoked. She has never used smokeless tobacco. She reports that she does not drink alcohol or use drugs.  Family History:  Family History  Problem Relation Age of Onset   Cancer Sister        breast   Cancer Maternal Aunt        breast   Coronary artery disease Other    Cancer Other        aunt, niece   Atrial fibrillation Other      Prior to Admission medications   Medication Sig Start Date End Date Taking? Authorizing Provider  acetaminophen (TYLENOL) 500 MG tablet Take 500 mg by mouth 2 (two) times daily as needed for moderate pain.     [provider]  amLODipine (NORVASC) 10 MG tablet Take 0.5 tablets (5 mg total) by mouth daily. 12/20/18   Plotnikov, Evie Lacks, MD  [facility] B-12 500 MCG SUBL Place 2 tablets (1,000 mcg total) under the tongue daily. 04/21/18   Plotnikov, Evie Lacks, MD  diphenhydrAMINE (SOMINEX) 25 MG tablet Take 25 mg by mouth at bedtime.     [provider]  ELIQUIS 5 MG TABS tablet TAKE 1 TABLET BY MOUTH TWICE DAILY 10/26/18   Leonie Man, MD  furosemide (LASIX) 40 MG tablet Take 0.5-1 tablets (20-40 mg total) by mouth daily as needed for fluid or edema (shortness of breath). One tablet by mouth daily for 3 days and then as needed for swelling or shortness of breath. 03/31/19   Plotnikov, Evie Lacks, MD  hydrocortisone 2.5 % ointment Apply topically 2 (two) times daily. 04/23/15   Plotnikov, Evie Lacks, MD  loratadine (CLARITIN) 10 MG tablet Take 1 tablet (10 mg total) by mouth  daily. 12/20/18   Plotnikov, Evie Lacks, MD  losartan (COZAAR) 100 MG tablet Take 1 tablet (100 mg total) by mouth daily. 04/21/18   Plotnikov, Evie Lacks, MD  metoprolol tartrate (LOPRESSOR) 25 MG tablet Take 1 tablet (25 mg total) by mouth as needed. For palpation 02/28/19 05/29/19  Leonie Man, MD  Multiple Vitamin (MULTIVITAMIN WITH MINERALS) TABS tablet Take 1 tablet by mouth daily.    [provider]  pantoprazole (PROTONIX) 40 MG tablet Take 1 tablet (40 mg total) by mouth 2 (two) times daily. 09/28/18   Plotnikov, Evie Lacks, MD  potassium chloride SA (KLOR-CON M20) 20 MEQ tablet Take 1 tablet (20 mEq total) by mouth 2 (  two) times daily. 09/28/18   Plotnikov, Evie Lacks, MD  pravastatin (PRAVACHOL) 20 MG tablet Take 1 tablet (20 mg total) by mouth daily. 07/19/18 07/19/19  Plotnikov, Evie Lacks, MD  zolpidem (AMBIEN) 5 MG tablet Take 0.5-1 tablets (2.5-5 mg total) by mouth at bedtime as needed for sleep (insomnia). 03/31/19   Plotnikov, Evie Lacks, MD    Physical Exam: Vitals:   04/09/19 2100 04/09/19 2115 04/09/19 2145 04/09/19 2324  BP: 122/75 (!) 126/95 130/66 (!) 133/91  Pulse: (!) 34 92 97 (!) 101  Resp: (!) 21 (!) 29 18 20   Temp:    98.9 F (37.2 C)  TempSrc:    Oral  SpO2: 97% 97% 96% 97%  Weight:    83.7 kg  Height:    5\' 6"  (1.676 m)   General: Not in acute distress HEENT:       Eyes: PERRL, EOMI, no scleral icterus.       ENT: No discharge from the ears and nose, no pharynx injection, no tonsillar enlargement.        Neck: positive JVD, no bruit, no mass felt. Heme: No neck lymph node enlargement. Cardiac: S1/S2, RRR, No murmurs, No gallops or rubs. Respiratory: No rales, wheezing, rhonchi or rubs. GI: Soft, nondistended, nontender, no rebound pain, no organomegaly, BS present. GU: No hematuria Ext: has pitting leg edema bilaterally. 2+DP/PT pulse bilaterally. Musculoskeletal: No joint deformities, No joint redness or warmth, no limitation of ROM in spin. Skin:  No rashes.  Neuro: Alert, oriented X3, cranial nerves II-XII grossly intact, moves all extremities normally.  Psych: Patient is not psychotic, no suicidal or hemocidal ideation.  Labs on Admission: I have personally reviewed following labs and imaging studies  CBC: Recent Labs  Lab 04/09/19 1738  WBC 5.4  HGB 12.8  HCT 39.2  MCV 86.0  PLT 026   Basic Metabolic Panel: Recent Labs  Lab 04/09/19 1738  NA 140  K 3.7  CL 102  CO2 26  GLUCOSE 106*  BUN 10  CREATININE 1.01*  CALCIUM 9.9   GFR: Estimated Creatinine Clearance: 45.2 mL/min (A) (by C-G formula based on SCr of 1.01 mg/dL (H)). Liver Function Tests: Recent Labs  Lab 04/09/19 1738  AST 33  ALT 28  ALKPHOS 61  BILITOT 0.6  PROT 7.0  ALBUMIN 3.7   No results for input(s): LIPASE, AMYLASE in the last 168 hours. No results for input(s): AMMONIA in the last 168 hours. Coagulation Profile: No results for input(s): INR, PROTIME in the last 168 hours. Cardiac Enzymes: No results for input(s): CKTOTAL, CKMB, CKMBINDEX, TROPONINI in the last 168 hours. BNP (last 3 results) No results for input(s): PROBNP in the last 8760 hours. HbA1C: No results for input(s): HGBA1C in the last 72 hours. CBG: No results for input(s): GLUCAP in the last 168 hours. Lipid Profile: No results for input(s): CHOL, HDL, LDLCALC, TRIG, CHOLHDL, LDLDIRECT in the last 72 hours. Thyroid Function Tests: Recent Labs    04/09/19 1847  TSH 2.691   Anemia Panel: No results for input(s): VITAMINB12, FOLATE, FERRITIN, TIBC, IRON, RETICCTPCT in the last 72 hours. Urine analysis:    Component Value Date/Time   COLORURINE YELLOW 04/09/2019 1852   APPEARANCEUR CLEAR 04/09/2019 1852   LABSPEC 1.006 04/09/2019 1852   PHURINE 5.0 04/09/2019 1852   GLUCOSEU NEGATIVE 04/09/2019 1852   GLUCOSEU NEGATIVE 07/28/2018 1620   HGBUR MODERATE (A) 04/09/2019 [place] NEGATIVE 04/09/2019 1852   BILIRUBINUR neg 09/24/2015 1055   KETONESUR  NEGATIVE 04/09/2019 1852   PROTEINUR NEGATIVE 04/09/2019 1852   UROBILINOGEN 1.0 07/28/2018 1620   NITRITE NEGATIVE 04/09/2019 1852   LEUKOCYTESUR NEGATIVE 04/09/2019 1852   Sepsis Labs: @LABRCNTIP (procalcitonin:4,lacticidven:4) ) Recent Results (from the past 240 hour(s))  SARS Coronavirus 2 [facility] order, Performed in [facility] hospital lab) Nasopharyngeal Nasopharyngeal Swab     Status: None   Collection Time: 04/09/19  9:35 PM   Specimen: Nasopharyngeal Swab  Result Value Ref Range Status   SARS Coronavirus 2 NEGATIVE NEGATIVE Final    Comment: (NOTE) If result is NEGATIVE SARS-CoV-2 target nucleic acids are NOT DETECTED. The SARS-CoV-2 RNA is generally detectable in upper and lower  respiratory specimens during the acute phase of infection. The lowest  concentration of SARS-CoV-2 viral copies this assay can detect is 250  copies / mL. A negative result does not preclude SARS-CoV-2 infection  and should not be used as the sole basis for treatment or other  patient management decisions.  A negative result may occur with  improper specimen collection / handling, submission of specimen other  than nasopharyngeal swab, presence of viral mutation(s) within the  areas targeted by this assay, and inadequate number of viral copies  (<250 copies / mL). A negative result must be combined with clinical  observations, patient history, and epidemiological information. If result is POSITIVE SARS-CoV-2 target nucleic acids are DETECTED. The SARS-CoV-2 RNA is generally detectable in upper and lower  respiratory specimens dur ing the acute phase of infection.  Positive  results are indicative of active infection with SARS-CoV-2.  Clinical  correlation with patient history and other diagnostic information is  necessary to determine patient infection status.  Positive results do  not rule out bacterial infection or co-infection with other viruses. If result is PRESUMPTIVE  POSTIVE SARS-CoV-2 nucleic acids MAY BE PRESENT.   A presumptive positive result was obtained on the submitted specimen  and confirmed on repeat testing.  While 2019 novel coronavirus  (SARS-CoV-2) nucleic acids may be present in the submitted sample  additional confirmatory testing may be necessary for epidemiological  and / or clinical management purposes  to differentiate between  SARS-CoV-2 and other Sarbecovirus currently known to infect humans.  If clinically indicated additional testing with an alternate test  methodology [phone]) is advised. The SARS-CoV-2 RNA is generally  detectable in upper and lower respiratory sp ecimens during the acute  phase of infection. The expected result is Negative. Fact Sheet for Patients:  StrictlyIdeas.no Fact Sheet for Healthcare Providers: BankingDealers.co.za This test is not yet approved or cleared by the [place] FDA and has been authorized for detection and/or diagnosis of SARS-CoV-2 by FDA under an Emergency Use Authorization (EUA).  This EUA will remain in effect (meaning this test can be used) for the duration of the COVID-19 declaration under Section 564(b)(1) of the Act, 21 U.S.C. section 360bbb-3(b)(1), unless the authorization is terminated or revoked sooner. Performed at [facility], [place] [address], [place], [place] [address]      Radiological Exams on Admission: Ct Angio Chest Pe W Or Wo Contrast  Result Date: 04/10/2019 CLINICAL DATA:  85 year old female with shortness of breath and chest pain. Generalized weakness. Concern for pulmonary embolism. EXAM: CT ANGIOGRAPHY CHEST CT ABDOMEN AND PELVIS WITH CONTRAST TECHNIQUE: Multidetector CT imaging of the chest was performed using the standard protocol during bolus administration of intravenous contrast. Multiplanar CT image reconstructions and MIPs were obtained to evaluate the vascular anatomy. Multidetector CT imaging of  the abdomen  and pelvis was performed using the standard protocol during bolus administration of intravenous contrast. CONTRAST:  154mL OMNIPAQUE IOHEXOL 350 MG/ML SOLN COMPARISON:  CT of the abdomen pelvis dated 11/07/2013 and chest radiograph dated 04/09/2019. FINDINGS: CTA CHEST FINDINGS Cardiovascular: There is moderate cardiomegaly. No pericardial effusion. There is retrograde flow of contrast from the right atrium into the IVC consistent with right heart dysfunction. There is moderate atherosclerotic calcification of the thoracic aorta. Evaluation of the aorta is limited due to non opacification and timing of the contrast. There is coronary vascular calcification involving the LAD, RCA, and left circumflex artery. There is no CT evidence of pulmonary embolism. Mediastinum/Nodes: No hilar or mediastinal adenopathy. Esophagus is grossly unremarkable. No mediastinal fluid collection. Lungs/Pleura: There is diffuse interstitial and interlobular septal prominence consistent with edema. Confluent hazy airspace density in the region of the right hilum in the right lower lobe may represent edema or developing infiltrate. Clinical correlation and follow-up recommended. There is a small right pleural effusion. No pneumothorax. There is thickening of the bronchial wall which may represent bronchitis. Musculoskeletal: Degenerative changes of the spine. No acute osseous pathology. Surgical clips noted in the region of the axilla bilaterally. Bilateral mastectomy. Partially visualized lipoma superficial to the right infraspinatus muscle measuring up to 4.5 cm. Review of the MIP images confirms the above findings. CT ABDOMEN and PELVIS FINDINGS There is no intra-abdominal free air or free fluid. Hepatobiliary: Apparent fatty infiltration of the liver. No intrahepatic biliary ductal dilatation. No calcified gallstone or pericholecystic fluid. Pancreas: Unremarkable. No pancreatic ductal dilatation or surrounding inflammatory  changes. Spleen: Normal in size without focal abnormality. Adrenals/Urinary Tract: Bilateral adrenal nodules, measuring up to 2.1 cm on the left, similar to prior CT. This nodules are not characterized but relative stability over the course of 5 years indicate a benign etiology, likely adenoma. There is no hydronephrosis on either side. There is symmetric enhancement and excretion of contrast by both kidneys. The visualized ureters and urinary bladder appear unremarkable. Stomach/Bowel: There is colonic diverticulosis without active inflammatory changes. There is no bowel obstruction. The appendix is not visualized with certainty. No inflammatory changes identified in the right lower quadrant. Vascular/Lymphatic: Advanced aortoiliac atherosclerotic disease. No portal venous gas. There is no adenopathy. Reproductive: Hysterectomy. The ovaries are grossly unremarkable. No adnexal masses. Other: Midline vertical anterior abdominal wall incisional scar. Musculoskeletal: Degenerative changes of the spine. No acute osseous pathology. Review of the MIP images confirms the above findings. IMPRESSION: 1. No CT evidence of pulmonary embolism or aortic dissection. 2. Cardiomegaly with evidence of right heart dysfunction. 3. Small right pleural effusion with findings of CHF. Confluent airspace density in the right lower lobe may represent edema or developing infiltrate. Clinical correlation and follow-up recommended. 4. Colonic diverticulosis. No bowel obstruction or active inflammation. 5. Aortic Atherosclerosis (ICD10-I70.0). Electronically Signed   By: Anner Crete M.D.   On: 04/10/2019 02:38   Ct Abdomen Pelvis W Contrast  Result Date: 04/10/2019 CLINICAL DATA:  85 year old female with shortness of breath and chest pain. Generalized weakness. Concern for pulmonary embolism. EXAM: CT ANGIOGRAPHY CHEST CT ABDOMEN AND PELVIS WITH CONTRAST TECHNIQUE: Multidetector CT imaging of the chest was performed using the standard  protocol during bolus administration of intravenous contrast. Multiplanar CT image reconstructions and MIPs were obtained to evaluate the vascular anatomy. Multidetector CT imaging of the abdomen and pelvis was performed using the standard protocol during bolus administration of intravenous contrast. CONTRAST:  114mL OMNIPAQUE IOHEXOL 350 MG/ML SOLN COMPARISON:  CT of  the abdomen pelvis dated 11/07/2013 and chest radiograph dated 04/09/2019. FINDINGS: CTA CHEST FINDINGS Cardiovascular: There is moderate cardiomegaly. No pericardial effusion. There is retrograde flow of contrast from the right atrium into the IVC consistent with right heart dysfunction. There is moderate atherosclerotic calcification of the thoracic aorta. Evaluation of the aorta is limited due to non opacification and timing of the contrast. There is coronary vascular calcification involving the LAD, RCA, and left circumflex artery. There is no CT evidence of pulmonary embolism. Mediastinum/Nodes: No hilar or mediastinal adenopathy. Esophagus is grossly unremarkable. No mediastinal fluid collection. Lungs/Pleura: There is diffuse interstitial and interlobular septal prominence consistent with edema. Confluent hazy airspace density in the region of the right hilum in the right lower lobe may represent edema or developing infiltrate. Clinical correlation and follow-up recommended. There is a small right pleural effusion. No pneumothorax. There is thickening of the bronchial wall which may represent bronchitis. Musculoskeletal: Degenerative changes of the spine. No acute osseous pathology. Surgical clips noted in the region of the axilla bilaterally. Bilateral mastectomy. Partially visualized lipoma superficial to the right infraspinatus muscle measuring up to 4.5 cm. Review of the MIP images confirms the above findings. CT ABDOMEN and PELVIS FINDINGS There is no intra-abdominal free air or free fluid. Hepatobiliary: Apparent fatty infiltration of the  liver. No intrahepatic biliary ductal dilatation. No calcified gallstone or pericholecystic fluid. Pancreas: Unremarkable. No pancreatic ductal dilatation or surrounding inflammatory changes. Spleen: Normal in size without focal abnormality. Adrenals/Urinary Tract: Bilateral adrenal nodules, measuring up to 2.1 cm on the left, similar to prior CT. This nodules are not characterized but relative stability over the course of 5 years indicate a benign etiology, likely adenoma. There is no hydronephrosis on either side. There is symmetric enhancement and excretion of contrast by both kidneys. The visualized ureters and urinary bladder appear unremarkable. Stomach/Bowel: There is colonic diverticulosis without active inflammatory changes. There is no bowel obstruction. The appendix is not visualized with certainty. No inflammatory changes identified in the right lower quadrant. Vascular/Lymphatic: Advanced aortoiliac atherosclerotic disease. No portal venous gas. There is no adenopathy. Reproductive: Hysterectomy. The ovaries are grossly unremarkable. No adnexal masses. Other: Midline vertical anterior abdominal wall incisional scar. Musculoskeletal: Degenerative changes of the spine. No acute osseous pathology. Review of the MIP images confirms the above findings. IMPRESSION: 1. No CT evidence of pulmonary embolism or aortic dissection. 2. Cardiomegaly with evidence of right heart dysfunction. 3. Small right pleural effusion with findings of CHF. Confluent airspace density in the right lower lobe may represent edema or developing infiltrate. Clinical correlation and follow-up recommended. 4. Colonic diverticulosis. No bowel obstruction or active inflammation. 5. Aortic Atherosclerosis (ICD10-I70.0). Electronically Signed   By: Anner Crete M.D.   On: 04/10/2019 02:38   Dg Chest Portable 1 View  Result Date: 04/09/2019 CLINICAL DATA:  Generalized weakness.  Shortness of breath. EXAM: PORTABLE CHEST 1 VIEW  COMPARISON:  May 23, 2014 FINDINGS: Stable cardiomegaly. The hila and mediastinum are normal. No pneumothorax. No pulmonary nodules or masses. No focal infiltrates. No overt edema. Mild pulmonary venous congestion not excluded. IMPRESSION: Cardiomegaly. Possible mild pulmonary venous congestion. No other abnormalities. Electronically Signed   By: Dorise Bullion III M.D   On: 04/09/2019 18:25     EKG: Independently reviewed.  Atrial fibrillation, QTc 464, LAD, poor R wave progression, anteroseptal infarction pattern, T wave inversion in V5-V6 which was presece in previous EKG  Assessment/Plan Principal Problem:   Chest pain Active Problems:   Hyperlipidemia with target  LDL less than 100   Essential hypertension   GERD   Permanent atrial fibrillation:  CHA2DS2-VASc Score (Eliquis)   Acute on chronic systolic CHF (congestive heart failure) (HCC)   Elevated troponin   CKD (chronic kidney disease), stage III (HCC)   Unintentional weight loss   Chest pain and elevated trop: Patient has pleuritic chest pain.  He has positive d-dimer 1.29, will need to rule out PE.  Troponin elevated at 26 -->26. BNP 1385. Pt has CHF exacerbation. Has possible demand ischemia, but demand ischemia should not have pleuritic chest pain.  - will place on Tele bed for obs - Trend Trop - Repeat EKG in the am  - prn Nitroglycerin, Morphine - allergic to ASA - Risk factor stratification: will check FLP and A1C  - check UDS - LE doppler to r/o DVT - CTA to r/o PE-->result is negative for PE.  Acute on chronic systolic CHF (congestive heart failure): 2D echo on 01/09/2018 showed EF 45-50%.  Patient has trace leg edema, but has positive JVD.  CXR showed cardiomegaly and mild vascular congestion, indicating CHF exacerbation. -Lasix 40 mg bid by IV -2d echo -Daily weights -strict I/O's -Low salt diet -Fluid restriction  Hyperlipidemia with target LDL less than 100: pt was on pravastatin in the past, but  currently not taking these medications. -f/u FLP  HTN:  -Continue home medications: Amlodipine, cozarr -IV hydralazine prn  GERD: -Protonix  Permanent atrial fibrillation:  CHA2DS2-VASc Score is 5, needs oral anticoagulation. Patient is on Eliquis at home. INR is  on admission. Heart rate is well controlled. -continue Eliquis - she was on metoprolol in the past, but currently not taking these medications.  CKD (chronic kidney disease), stage III ([place]): stable. Cre 1.01 and BUN 10. -f/u by BMP  Unintentional weight loss: has 70 pounds of unintentional weight loss.  Etiology is not clear.  Patient has history of breast cancer, will need to rule out cancer or metastasized to disease. -Will get CT-abdomen/pelvis -f/u CTA of chest (for r/o PE)    DVT ppx: on Eliquis Code Status: Full code Family Communication: Yes, patient's son   at bed side Disposition Plan:  Anticipate discharge back to previous home environment Consults called:  none Admission status: Obs / tele     Date of Service 04/10/2019    [facility]   If 7PM-7AM, please contact night-coverage [email or web address] Password [facility] 04/10/2019, 3:43 AM

## 2019-04-10 ENCOUNTER — Encounter (HOSPITAL_COMMUNITY): Payer: Self-pay | Admitting: Radiology

## 2019-04-10 ENCOUNTER — Observation Stay (HOSPITAL_COMMUNITY): Payer: Medicare Other

## 2019-04-10 DIAGNOSIS — Z90710 Acquired absence of both cervix and uterus: Secondary | ICD-10-CM | POA: Diagnosis not present

## 2019-04-10 DIAGNOSIS — I083 Combined rheumatic disorders of mitral, aortic and tricuspid valves: Secondary | ICD-10-CM | POA: Diagnosis present

## 2019-04-10 DIAGNOSIS — Z853 Personal history of malignant neoplasm of breast: Secondary | ICD-10-CM | POA: Diagnosis not present

## 2019-04-10 DIAGNOSIS — R634 Abnormal weight loss: Secondary | ICD-10-CM

## 2019-04-10 DIAGNOSIS — R0602 Shortness of breath: Secondary | ICD-10-CM | POA: Diagnosis not present

## 2019-04-10 DIAGNOSIS — D151 Benign neoplasm of heart: Secondary | ICD-10-CM | POA: Diagnosis not present

## 2019-04-10 DIAGNOSIS — I4821 Permanent atrial fibrillation: Secondary | ICD-10-CM | POA: Diagnosis not present

## 2019-04-10 DIAGNOSIS — I5189 Other ill-defined heart diseases: Secondary | ICD-10-CM | POA: Diagnosis present

## 2019-04-10 DIAGNOSIS — R040 Epistaxis: Secondary | ICD-10-CM | POA: Diagnosis not present

## 2019-04-10 DIAGNOSIS — E876 Hypokalemia: Secondary | ICD-10-CM | POA: Diagnosis present

## 2019-04-10 DIAGNOSIS — R7989 Other specified abnormal findings of blood chemistry: Secondary | ICD-10-CM

## 2019-04-10 DIAGNOSIS — Z7901 Long term (current) use of anticoagulants: Secondary | ICD-10-CM | POA: Diagnosis not present

## 2019-04-10 DIAGNOSIS — Z20828 Contact with and (suspected) exposure to other viral communicable diseases: Secondary | ICD-10-CM | POA: Diagnosis present

## 2019-04-10 DIAGNOSIS — Z96651 Presence of right artificial knee joint: Secondary | ICD-10-CM | POA: Diagnosis present

## 2019-04-10 DIAGNOSIS — I361 Nonrheumatic tricuspid (valve) insufficiency: Secondary | ICD-10-CM

## 2019-04-10 DIAGNOSIS — R931 Abnormal findings on diagnostic imaging of heart and coronary circulation: Secondary | ICD-10-CM | POA: Diagnosis not present

## 2019-04-10 DIAGNOSIS — Z9013 Acquired absence of bilateral breasts and nipples: Secondary | ICD-10-CM | POA: Diagnosis not present

## 2019-04-10 DIAGNOSIS — K219 Gastro-esophageal reflux disease without esophagitis: Secondary | ICD-10-CM | POA: Diagnosis present

## 2019-04-10 DIAGNOSIS — K5730 Diverticulosis of large intestine without perforation or abscess without bleeding: Secondary | ICD-10-CM | POA: Diagnosis not present

## 2019-04-10 DIAGNOSIS — E538 Deficiency of other specified B group vitamins: Secondary | ICD-10-CM | POA: Diagnosis present

## 2019-04-10 DIAGNOSIS — I2510 Atherosclerotic heart disease of native coronary artery without angina pectoris: Secondary | ICD-10-CM | POA: Diagnosis present

## 2019-04-10 DIAGNOSIS — R600 Localized edema: Secondary | ICD-10-CM | POA: Diagnosis not present

## 2019-04-10 DIAGNOSIS — I340 Nonrheumatic mitral (valve) insufficiency: Secondary | ICD-10-CM

## 2019-04-10 DIAGNOSIS — I130 Hypertensive heart and chronic kidney disease with heart failure and stage 1 through stage 4 chronic kidney disease, or unspecified chronic kidney disease: Secondary | ICD-10-CM | POA: Diagnosis not present

## 2019-04-10 DIAGNOSIS — I959 Hypotension, unspecified: Secondary | ICD-10-CM | POA: Diagnosis not present

## 2019-04-10 DIAGNOSIS — E785 Hyperlipidemia, unspecified: Secondary | ICD-10-CM | POA: Diagnosis not present

## 2019-04-10 DIAGNOSIS — Z85828 Personal history of other malignant neoplasm of skin: Secondary | ICD-10-CM | POA: Diagnosis not present

## 2019-04-10 DIAGNOSIS — I5023 Acute on chronic systolic (congestive) heart failure: Secondary | ICD-10-CM

## 2019-04-10 DIAGNOSIS — E559 Vitamin D deficiency, unspecified: Secondary | ICD-10-CM | POA: Diagnosis present

## 2019-04-10 DIAGNOSIS — Z79899 Other long term (current) drug therapy: Secondary | ICD-10-CM | POA: Diagnosis not present

## 2019-04-10 DIAGNOSIS — N183 Chronic kidney disease, stage 3 (moderate): Secondary | ICD-10-CM | POA: Diagnosis not present

## 2019-04-10 DIAGNOSIS — I248 Other forms of acute ischemic heart disease: Secondary | ICD-10-CM | POA: Diagnosis not present

## 2019-04-10 DIAGNOSIS — R079 Chest pain, unspecified: Secondary | ICD-10-CM | POA: Diagnosis not present

## 2019-04-10 HISTORY — PX: TRANSTHORACIC ECHOCARDIOGRAM: SHX275

## 2019-04-10 LAB — BASIC METABOLIC PANEL
Anion gap: 10 (ref 5–15)
BUN: 11 mg/dL (ref 8–23)
CO2: 29 mmol/L (ref 22–32)
Calcium: 9.2 mg/dL (ref 8.9–10.3)
Chloride: 101 mmol/L (ref 98–111)
Creatinine, Ser: 0.93 mg/dL (ref 0.44–1.00)
GFR calc Af Amer: >60 mL/min (ref >60)
GFR calc non Af Amer: 56 mL/min — ABNORMAL LOW (ref >60)
Glucose, Bld: 124 mg/dL — ABNORMAL HIGH (ref 70–99)
Potassium: 3.3 mmol/L — ABNORMAL LOW (ref 3.5–5.1)
Sodium: 140 mmol/L (ref 135–145)

## 2019-04-10 LAB — RAPID URINE DRUG SCREEN, HOSP PERFORMED
Amphetamines: NOT DETECTED
Barbiturates: NOT DETECTED
Benzodiazepines: NOT DETECTED
Cocaine: NOT DETECTED
Opiates: NOT DETECTED
Tetrahydrocannabinol: NOT DETECTED

## 2019-04-10 LAB — ECHOCARDIOGRAM COMPLETE
Height: 66 in
Weight: 2828.8 oz

## 2019-04-10 LAB — LIPID PANEL
Cholesterol: 210 mg/dL — ABNORMAL HIGH (ref 0–200)
HDL: 62 mg/dL (ref >40)
LDL Cholesterol: 138 mg/dL — ABNORMAL HIGH (ref 0–99)
Total CHOL/HDL Ratio: 3.4 RATIO
Triglycerides: 51 mg/dL (ref <150)
VLDL: 10 mg/dL (ref 0–40)

## 2019-04-10 LAB — HEMOGLOBIN A1C
Hgb A1c MFr Bld: 5.6 % (ref 4.8–5.6)
Mean Plasma Glucose: 114.02 mg/dL

## 2019-04-10 MED ORDER — POTASSIUM CHLORIDE CRYS ER 20 MEQ PO TBCR
40.0000 meq | EXTENDED_RELEASE_TABLET | Freq: Once | ORAL | Status: AC
  Administered 2019-04-10: 40 meq via ORAL
  Filled 2019-04-10: qty 2

## 2019-04-10 MED ORDER — APIXABAN 5 MG PO TABS
5.0000 mg | ORAL_TABLET | Freq: Two times a day (BID) | ORAL | Status: DC
  Administered 2019-04-10 – 2019-04-15 (×10): 5 mg via ORAL
  Filled 2019-04-10 (×10): qty 1

## 2019-04-10 MED ORDER — PRAVASTATIN SODIUM 10 MG PO TABS
20.0000 mg | ORAL_TABLET | Freq: Every day | ORAL | Status: DC
  Administered 2019-04-10 – 2019-04-11 (×2): 20 mg via ORAL
  Filled 2019-04-10 (×2): qty 2

## 2019-04-10 MED ORDER — IOHEXOL 350 MG/ML SOLN
100.0000 mL | Freq: Once | INTRAVENOUS | Status: AC | PRN
  Administered 2019-04-10: 100 mL via INTRAVENOUS

## 2019-04-10 NOTE — Progress Notes (Addendum)
ANTICOAGULATION CONSULT NOTE - Initial Consult  Pharmacy Consult:  Eliquis >> IV heparin Indication: atrial fibrillation  Allergies  Allergen Reactions  . Anastrozole Other (See Comments)    arthralgias  . Aspirin Other (See Comments)    Gi upset   . Ciprofloxacin Itching  . Letrozole Other (See Comments)    leg pain    Patient Measurements: Height: 5\' 6"  (167.6 cm) Weight: 176 lb 12.8 oz (80.2 kg) IBW/kg (Calculated) : 59.3 Heparin Dosing Weight: 76 kg  Vital Signs: Temp: 98.3 F (36.8 C) (08/09 1301) Temp Source: Oral (08/09 1301) BP: 107/59 (08/09 1301) Pulse Rate: 59 (08/09 1301)  Labs: Recent Labs    04/09/19 1738 04/09/19 1957 04/09/19 2201 04/10/19 1119  HGB 12.8  --   --   --   HCT 39.2  --   --   --   PLT 211  --   --   --   CREATININE 1.01*  --   --  0.93  TROPONINIHS  --  26* 26*  --     Estimated Creatinine Clearance: 48.1 mL/min (by C-G formula based on SCr of 0.93 mg/dL).   Medical History: Past Medical History:  Diagnosis Date  . Acute lymphadenitis of arm   . Anemia    iron deficiency  . Atrial fibrillation ([place])   . Breast cancer ([place])   . Depression 2009   grief  . Diverticulosis of colon 2004   Dr Deatra Ina  . GERD (gastroesophageal reflux disease)   . Heart murmur   . Helicobacter pylori gastritis   . Hematuria    Dr Terance Hart  . Hyperlipidemia   . Hypertension   . Osteoarthritis, knee   . Skin cancer   . Vitamin B12 deficiency   . Vitamin D deficiency      Assessment: 49 YOF presented with chest pain and SOB.  Patient was continued on Eliquis from PTA for history of Afib, last dose given today at 1237.  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  Baseline labs reviewed; no bleeding reported.  Goal of Therapy:  Heparin level 0.3-0.7 units/ml aPTT 66-102 seconds Monitor platelets by anticoagulation protocol: Yes   Plan:  D/C Eliquis Check baseline aPTT/heparin level at  2100 If baseline aPTT acceptable - at 0100 on 8/10 start IV heparin at 1100 units/hr Check aPTT 6 hrs after heparin is started Daily heparin level, aPTT and CBC  Clio Gerhart D. Mina Marble, PharmD, BCPS, [place] 04/10/2019, 4:58 PM

## 2019-04-10 NOTE — Plan of Care (Signed)

## 2019-04-10 NOTE — Progress Notes (Signed)
Patient's son Deidre Ala notified of current plan of care, questions answered.

## 2019-04-10 NOTE — Progress Notes (Signed)
VASCULAR LAB PRELIMINARY  PRELIMINARY  PRELIMINARY  PRELIMINARY  Bilateral lower extremity venous duplex completed.    Preliminary report:  See CV proc for preliminary results.   Shakai Dolley, RVT 04/10/2019, 9:41 AM

## 2019-04-10 NOTE — Consult Note (Addendum)
Cardiology Consultation:   Patient ID: [name] [name] MRN: [id number]; DOB: June 25, 1933  Admit date: 04/09/2019 Date of Consult: 04/10/2019  Primary Care Provider: Cassandria Anger, MD Primary Cardiologist: Glenetta Hew, MD  Primary Electrophysiologist:  None    Patient Profile:   [name] [name] is a 85 y.o. female with a hx of hypertension, hyperlipidemia, GERD, depression, atrial fibrillation, breast cancer, systolic heart failure, CKD stage III who is being seen today for the evaluation of heart failure exacerbation, left atrial thrombus at the request of Irine Seal.  History of Present Illness:   [name] [name] presented to the hospital with shortness of breath of 2 days.  She presented yesterday.  She also complained of a dry cough.  She had chest pain which is 10 out of 10 in severity at the time.  Chest pain was pleuritic activated by deep breathing.  She was also quite short of breath.  She has not slept in the last 2 days prior to admission.  She has not been able to lay flat in bed.  She also had generalized weakness.  In the emergency room, she was found to have a BNP of 1385.  Echocardiogram showed an ejection fraction of 25% with a left atrial mass.  Her prior ejection fraction was 40 to 45%.  She is currently being diuresed and is net -1.4 L.    Heart Pathway Score:  HEAR Score: 4  Past Medical History:  Diagnosis Date   Acute lymphadenitis of arm    Anemia    iron deficiency   Atrial fibrillation (HCC)    Breast cancer ([place])    Depression 2009   grief   Diverticulosis of colon 2004   Dr Deatra Ina   GERD (gastroesophageal reflux disease)    Heart murmur    Helicobacter pylori gastritis    Hematuria    Dr Terance Hart   Hyperlipidemia    Hypertension    Osteoarthritis, knee    Skin cancer    Vitamin B12 deficiency    Vitamin D deficiency     Past Surgical History:  Procedure Laterality Date   ABDOMINAL HYSTERECTOMY     CARDIOVERSION N/A  09/06/2018   Procedure: CARDIOVERSION;  Surgeon: Fay Records, MD;  Location: [facility] ENDOSCOPY;  Service: Cardiovascular;  Laterality: N/A; --> after initial success with sinus rhythm, the patient went back into A. fib within 2 minutes.   CORONARY CT ANGIOGRAM  09/14/2018   Coronary Calcium Score 276.  Mild mixed plaque in the left main with minimal stenosis.  Mixed plaque in the ostial LAD, proximal LCx and moderate size OM1, as well as proximal RCA (<50% stenosis throughout.).  Nonobstructive CAD.  Intermediate risk   JOINT REPLACEMENT     L groin skin cancer  2011   MASTECTOMY  09-13-08   left and right   TOTAL KNEE ARTHROPLASTY  06-04-98   TOTAL KNEE ARTHROPLASTY Right 05/24/2014   Procedure: RIGHT TOTAL KNEE ARTHROPLASTY;  Surgeon: Newt Minion, MD;  Location: [facility];  Service: Orthopedics;  Laterality: Right;   TRANSTHORACIC ECHOCARDIOGRAM  08/11/2018   Persistent A. fib: Mildly reduced EF of 45 to 50% but diffuse HK.  If A. fib the entire time.  Moderate LA and moderate to severe RA dilation (suggesting longstanding A. fib, and potentially explaining difficulty cardioversion).  No significant valve disease.  Aortic sclerosis no stenosis.  Unable to assess diastolic function with A. fib     Home Medications:  Prior to Admission medications  Medication Sig Start Date End Date Taking? Authorizing Provider  acetaminophen (TYLENOL) 500 MG tablet Take 500 mg by mouth 2 (two) times daily as needed for moderate pain.    Yes [provider]  amLODipine (NORVASC) 10 MG tablet Take 0.5 tablets (5 mg total) by mouth daily. 12/20/18  Yes Plotnikov, Evie Lacks, MD  [facility] B-12 500 MCG SUBL Place 2 tablets (1,000 mcg total) under the tongue daily. 04/21/18  Yes Plotnikov, Evie Lacks, MD  diphenhydrAMINE (SOMINEX) 25 MG tablet Take 25 mg by mouth at bedtime.    Yes [provider]  ELIQUIS 5 MG TABS tablet TAKE 1 TABLET BY MOUTH TWICE DAILY Patient taking differently: Take 5 mg by mouth 2  (two) times daily.  10/26/18  Yes Leonie Man, MD  furosemide (LASIX) 40 MG tablet Take 0.5-1 tablets (20-40 mg total) by mouth daily as needed for fluid or edema (shortness of breath). One tablet by mouth daily for 3 days and then as needed for swelling or shortness of breath. 03/31/19  Yes Plotnikov, Evie Lacks, MD  losartan (COZAAR) 100 MG tablet Take 1 tablet (100 mg total) by mouth daily. 04/21/18  Yes Plotnikov, Evie Lacks, MD  metoprolol tartrate (LOPRESSOR) 25 MG tablet Take 1 tablet (25 mg total) by mouth as needed. For palpation 02/28/19 05/29/19 Yes Leonie Man, MD  Multiple Vitamin (MULTIVITAMIN WITH MINERALS) TABS tablet Take 1 tablet by mouth daily.   Yes [provider]  pantoprazole (PROTONIX) 40 MG tablet Take 1 tablet (40 mg total) by mouth 2 (two) times daily. 09/28/18  Yes Plotnikov, Evie Lacks, MD  potassium chloride SA (KLOR-CON M20) 20 MEQ tablet Take 1 tablet (20 mEq total) by mouth 2 (two) times daily. 09/28/18  Yes Plotnikov, Evie Lacks, MD  zolpidem (AMBIEN) 5 MG tablet Take 0.5-1 tablets (2.5-5 mg total) by mouth at bedtime as needed for sleep (insomnia). 03/31/19  Yes Plotnikov, Evie Lacks, MD  loratadine (CLARITIN) 10 MG tablet Take 1 tablet (10 mg total) by mouth daily. Patient not taking: Reported on 04/09/2019 12/20/18   Plotnikov, Evie Lacks, MD  pravastatin (PRAVACHOL) 20 MG tablet Take 1 tablet (20 mg total) by mouth daily. Patient not taking: Reported on 04/09/2019 07/19/18 07/19/19  Plotnikov, Evie Lacks, MD    Inpatient Medications: Scheduled Meds:  apixaban  5 mg Oral BID   diphenhydrAMINE  25 mg Oral QHS   furosemide  40 mg Intravenous Q12H   losartan  100 mg Oral Daily   multivitamin with minerals  1 tablet Oral Daily   pantoprazole  40 mg Oral BID   potassium chloride  40 mEq Oral Once   pravastatin  20 mg Oral q1800   sodium chloride flush  3 mL Intravenous Once   Continuous Infusions:  PRN Meds: acetaminophen,  dextromethorphan-guaiFENesin, hydrALAZINE, levalbuterol, morphine injection, nitroGLYCERIN, ondansetron (ZOFRAN) IV, zolpidem  Allergies:    Allergies  Allergen Reactions   Anastrozole Other (See Comments)    arthralgias   Aspirin Other (See Comments)    Gi upset    Ciprofloxacin Itching   Letrozole Other (See Comments)    leg pain    Social History:   Social History   Socioeconomic History   Marital status: Widowed    Spouse name: Not on file   Number of children: 7   Years of education: Not on file   Highest education level: Not on file  Occupational History   Not on file  Social Needs   Financial resource  strain: Not hard at all   Food insecurity    Worry: Never true    Inability: Never true   Transportation needs    Medical: No    Non-medical: No  Tobacco Use   Smoking status: Never Smoker   Smokeless tobacco: Never Used  Substance and Sexual Activity   Alcohol use: No   Drug use: No   Sexual activity: Never  Lifestyle   Physical activity    Days per week: 3 days    Minutes per session: 50 min   Stress: Not at all  Relationships   Social connections    Talks on phone: More than three times a week    Gets together: More than three times a week    Attends religious service: More than 4 times per year    Active member of club or organization: Yes    Attends meetings of clubs or organizations: More than 4 times per year    Relationship status: Widowed   Intimate partner violence    Fear of current or ex partner: Not on file    Emotionally abused: Not on file    Physically abused: Not on file    Forced sexual activity: Not on file  Other Topics Concern   Not on file  Social History Narrative   Not on file    Family History:    Family History  Problem Relation Age of Onset   Cancer Sister        breast   Cancer Maternal Aunt        breast   Coronary artery disease Other    Cancer Other        aunt, niece   Atrial  fibrillation Other      ROS:  Please see the history of present illness.   All other ROS reviewed and negative.     Physical Exam/Data:   Vitals:   04/09/19 2145 04/09/19 2324 04/10/19 0558 04/10/19 1301  BP: 130/66 (!) 133/91 (!) 109/59 (!) 107/59  Pulse: 97 (!) 101 85 (!) 59  Resp: 18 20 18 18   Temp:  98.9 F (37.2 C) (!) 97.4 F (36.3 C) 98.3 F (36.8 C)  TempSrc:  Oral Oral Oral  SpO2: 96% 97% 94% 99%  Weight:  83.7 kg 80.2 kg   Height:  5\' 6"  (1.676 m)      Intake/Output Summary (Last 24 hours) at 04/10/2019 1631 Last data filed at 04/10/2019 0850 Gross per 24 hour  Intake 720 ml  Output 2150 ml  Net -1430 ml   Last 3 Weights 04/10/2019 04/09/2019 03/31/2019  Weight (lbs) 176 lb 12.8 oz 184 lb 8 oz 191 lb  Weight (kg) 80.196 kg 83.689 kg 86.637 kg     Body mass index is 28.54 kg/m.  General:  Well nourished, well developed, in no acute distress HEENT: normal Lymph: no adenopathy Neck: no JVD Endocrine:  No thryomegaly Vascular: No carotid bruits; FA pulses 2+ bilaterally without bruits  Cardiac:  normal S1, S2; RRR; no murmur  Lungs:  clear to auscultation bilaterally, no wheezing, rhonchi or rales  Abd: soft, nontender, no hepatomegaly  Ext: no edema Musculoskeletal:  No deformities, BUE and BLE strength normal and equal Skin: warm and dry  Neuro:  CNs 2-12 intact, no focal abnormalities noted Psych:  Normal affect   EKG:  The EKG was personally reviewed and demonstrates: Atrial fibrillation Telemetry:  Telemetry was personally reviewed and demonstrates: Atrial fibrillation  Relevant CV Studies: TTE  1. The left ventricle has severely reduced systolic function, with an ejection fraction of 20-25%. The cavity size was mildly dilated. Left ventricular diastolic function could not be evaluated secondary to atrial fibrillation. Elevated left atrial and  left ventricular end-diastolic pressures Left ventricular diffuse hypokinesis.  2. There is akinesis of the  basal and mid infero and anteroseptum, apical septum, apical anterior and apical inferolaterl akinesis.  3. The right ventricle has moderately reduced systolic function. The cavity was severely enlarged. There is no increase in right ventricular wall thickness. Right ventricular systolic pressure is moderately elevated at 26mmhg.  4. Left atrial size was severely dilated.  5. There is a 2.3 x 1.43cm mass in the posterior LA of uncler significance. Cannot rule out thrombus. Consider TEE if clinically indicated.  6. Right atrial size was severely dilated.  7. Mild thickening of the mitral valve leaflet. Mitral valve regurgitation is mild to moderate by color flow Doppler.  8. The aortic valve is tricuspid. Moderate sclerosis of the aortic valve. Aortic valve regurgitation was not assessed by color flow Doppler. Moderate aortic annular calcification noted.  9. Tricuspid valve regurgitation is moderate. 10. The aorta is normal in size and structure. 11. The inferior vena cava was dilated in size with <50% respiratory variability.  Laboratory Data:  High Sensitivity Troponin:   Recent Labs  Lab 04/09/19 1957 04/09/19 2201  TROPONINIHS 26* 26*     Cardiac EnzymesNo results for input(s): TROPONINI in the last 168 hours. No results for input(s): TROPIPOC in the last 168 hours.  Chemistry Recent Labs  Lab 04/09/19 1738 04/10/19 1119  NA 140 140  K 3.7 3.3*  CL 102 101  CO2 26 29  GLUCOSE 106* 124*  BUN 10 11  CREATININE 1.01* 0.93  CALCIUM 9.9 9.2  GFRNONAA 51* 56*  GFRAA 59* >60  ANIONGAP 12 10    Recent Labs  Lab 04/09/19 1738  PROT 7.0  ALBUMIN 3.7  AST 33  ALT 28  ALKPHOS 61  BILITOT 0.6   Hematology Recent Labs  Lab 04/09/19 1738  WBC 5.4  RBC 4.56  HGB 12.8  HCT 39.2  MCV 86.0  MCH 28.1  MCHC 32.7  RDW 15.8*  PLT 211   BNP Recent Labs  Lab 04/09/19 2201  BNP 1,385.5*    DDimer  Recent Labs  Lab 04/09/19 2224  DDIMER 1.29*     Radiology/Studies:   Ct Angio Chest Pe W Or Wo Contrast  Result Date: 04/10/2019 CLINICAL DATA:  85 year old female with shortness of breath and chest pain. Generalized weakness. Concern for pulmonary embolism. EXAM: CT ANGIOGRAPHY CHEST CT ABDOMEN AND PELVIS WITH CONTRAST TECHNIQUE: Multidetector CT imaging of the chest was performed using the standard protocol during bolus administration of intravenous contrast. Multiplanar CT image reconstructions and MIPs were obtained to evaluate the vascular anatomy. Multidetector CT imaging of the abdomen and pelvis was performed using the standard protocol during bolus administration of intravenous contrast. CONTRAST:  142mL OMNIPAQUE IOHEXOL 350 MG/ML SOLN COMPARISON:  CT of the abdomen pelvis dated 11/07/2013 and chest radiograph dated 04/09/2019. FINDINGS: CTA CHEST FINDINGS Cardiovascular: There is moderate cardiomegaly. No pericardial effusion. There is retrograde flow of contrast from the right atrium into the IVC consistent with right heart dysfunction. There is moderate atherosclerotic calcification of the thoracic aorta. Evaluation of the aorta is limited due to non opacification and timing of the contrast. There is coronary vascular calcification involving the LAD, RCA, and left circumflex artery. There is no CT  evidence of pulmonary embolism. Mediastinum/Nodes: No hilar or mediastinal adenopathy. Esophagus is grossly unremarkable. No mediastinal fluid collection. Lungs/Pleura: There is diffuse interstitial and interlobular septal prominence consistent with edema. Confluent hazy airspace density in the region of the right hilum in the right lower lobe may represent edema or developing infiltrate. Clinical correlation and follow-up recommended. There is a small right pleural effusion. No pneumothorax. There is thickening of the bronchial wall which may represent bronchitis. Musculoskeletal: Degenerative changes of the spine. No acute osseous pathology. Surgical clips noted in the  region of the axilla bilaterally. Bilateral mastectomy. Partially visualized lipoma superficial to the right infraspinatus muscle measuring up to 4.5 cm. Review of the MIP images confirms the above findings. CT ABDOMEN and PELVIS FINDINGS There is no intra-abdominal free air or free fluid. Hepatobiliary: Apparent fatty infiltration of the liver. No intrahepatic biliary ductal dilatation. No calcified gallstone or pericholecystic fluid. Pancreas: Unremarkable. No pancreatic ductal dilatation or surrounding inflammatory changes. Spleen: Normal in size without focal abnormality. Adrenals/Urinary Tract: Bilateral adrenal nodules, measuring up to 2.1 cm on the left, similar to prior CT. This nodules are not characterized but relative stability over the course of 5 years indicate a benign etiology, likely adenoma. There is no hydronephrosis on either side. There is symmetric enhancement and excretion of contrast by both kidneys. The visualized ureters and urinary bladder appear unremarkable. Stomach/Bowel: There is colonic diverticulosis without active inflammatory changes. There is no bowel obstruction. The appendix is not visualized with certainty. No inflammatory changes identified in the right lower quadrant. Vascular/Lymphatic: Advanced aortoiliac atherosclerotic disease. No portal venous gas. There is no adenopathy. Reproductive: Hysterectomy. The ovaries are grossly unremarkable. No adnexal masses. Other: Midline vertical anterior abdominal wall incisional scar. Musculoskeletal: Degenerative changes of the spine. No acute osseous pathology. Review of the MIP images confirms the above findings. IMPRESSION: 1. No CT evidence of pulmonary embolism or aortic dissection. 2. Cardiomegaly with evidence of right heart dysfunction. 3. Small right pleural effusion with findings of CHF. Confluent airspace density in the right lower lobe may represent edema or developing infiltrate. Clinical correlation and follow-up  recommended. 4. Colonic diverticulosis. No bowel obstruction or active inflammation. 5. Aortic Atherosclerosis (ICD10-I70.0). Electronically Signed   By: Anner Crete M.D.   On: 04/10/2019 02:38   Ct Abdomen Pelvis W Contrast  Result Date: 04/10/2019 CLINICAL DATA:  85 year old female with shortness of breath and chest pain. Generalized weakness. Concern for pulmonary embolism. EXAM: CT ANGIOGRAPHY CHEST CT ABDOMEN AND PELVIS WITH CONTRAST TECHNIQUE: Multidetector CT imaging of the chest was performed using the standard protocol during bolus administration of intravenous contrast. Multiplanar CT image reconstructions and MIPs were obtained to evaluate the vascular anatomy. Multidetector CT imaging of the abdomen and pelvis was performed using the standard protocol during bolus administration of intravenous contrast. CONTRAST:  137mL OMNIPAQUE IOHEXOL 350 MG/ML SOLN COMPARISON:  CT of the abdomen pelvis dated 11/07/2013 and chest radiograph dated 04/09/2019. FINDINGS: CTA CHEST FINDINGS Cardiovascular: There is moderate cardiomegaly. No pericardial effusion. There is retrograde flow of contrast from the right atrium into the IVC consistent with right heart dysfunction. There is moderate atherosclerotic calcification of the thoracic aorta. Evaluation of the aorta is limited due to non opacification and timing of the contrast. There is coronary vascular calcification involving the LAD, RCA, and left circumflex artery. There is no CT evidence of pulmonary embolism. Mediastinum/Nodes: No hilar or mediastinal adenopathy. Esophagus is grossly unremarkable. No mediastinal fluid collection. Lungs/Pleura: There is diffuse interstitial and interlobular septal  prominence consistent with edema. Confluent hazy airspace density in the region of the right hilum in the right lower lobe may represent edema or developing infiltrate. Clinical correlation and follow-up recommended. There is a small right pleural effusion. No  pneumothorax. There is thickening of the bronchial wall which may represent bronchitis. Musculoskeletal: Degenerative changes of the spine. No acute osseous pathology. Surgical clips noted in the region of the axilla bilaterally. Bilateral mastectomy. Partially visualized lipoma superficial to the right infraspinatus muscle measuring up to 4.5 cm. Review of the MIP images confirms the above findings. CT ABDOMEN and PELVIS FINDINGS There is no intra-abdominal free air or free fluid. Hepatobiliary: Apparent fatty infiltration of the liver. No intrahepatic biliary ductal dilatation. No calcified gallstone or pericholecystic fluid. Pancreas: Unremarkable. No pancreatic ductal dilatation or surrounding inflammatory changes. Spleen: Normal in size without focal abnormality. Adrenals/Urinary Tract: Bilateral adrenal nodules, measuring up to 2.1 cm on the left, similar to prior CT. This nodules are not characterized but relative stability over the course of 5 years indicate a benign etiology, likely adenoma. There is no hydronephrosis on either side. There is symmetric enhancement and excretion of contrast by both kidneys. The visualized ureters and urinary bladder appear unremarkable. Stomach/Bowel: There is colonic diverticulosis without active inflammatory changes. There is no bowel obstruction. The appendix is not visualized with certainty. No inflammatory changes identified in the right lower quadrant. Vascular/Lymphatic: Advanced aortoiliac atherosclerotic disease. No portal venous gas. There is no adenopathy. Reproductive: Hysterectomy. The ovaries are grossly unremarkable. No adnexal masses. Other: Midline vertical anterior abdominal wall incisional scar. Musculoskeletal: Degenerative changes of the spine. No acute osseous pathology. Review of the MIP images confirms the above findings. IMPRESSION: 1. No CT evidence of pulmonary embolism or aortic dissection. 2. Cardiomegaly with evidence of right heart dysfunction.  3. Small right pleural effusion with findings of CHF. Confluent airspace density in the right lower lobe may represent edema or developing infiltrate. Clinical correlation and follow-up recommended. 4. Colonic diverticulosis. No bowel obstruction or active inflammation. 5. Aortic Atherosclerosis (ICD10-I70.0). Electronically Signed   By: Anner Crete M.D.   On: 04/10/2019 02:38   Dg Chest Portable 1 View  Result Date: 04/09/2019 CLINICAL DATA:  Generalized weakness.  Shortness of breath. EXAM: PORTABLE CHEST 1 VIEW COMPARISON:  May 23, 2014 FINDINGS: Stable cardiomegaly. The hila and mediastinum are normal. No pneumothorax. No pulmonary nodules or masses. No focal infiltrates. No overt edema. Mild pulmonary venous congestion not excluded. IMPRESSION: Cardiomegaly. Possible mild pulmonary venous congestion. No other abnormalities. Electronically Signed   By: Dorise Bullion III M.D   On: 04/09/2019 18:25   Vas [place] Lower Extremity Venous (dvt)  Result Date: 04/10/2019  Lower Venous Study Indications: Elevated D-Dimer.  Comparison Study: No prior study available for comparison. Performing Technologist: Sharion Dove RVS  Examination Guidelines: A complete evaluation includes B-mode imaging, spectral Doppler, color Doppler, and power Doppler as needed of all accessible portions of each vessel. Bilateral testing is considered an integral part of a complete examination. Limited examinations for reoccurring indications may be performed as noted.  +---------+---------------+---------+-----------+----------+-------+  RIGHT     Compressibility Phasicity Spontaneity Properties Summary  +---------+---------------+---------+-----------+----------+-------+  CFV       Full            Yes       Yes                             +---------+---------------+---------+-----------+----------+-------+  SFJ  Full                                                       +---------+---------------+---------+-----------+----------+-------+  FV Prox   Full                                                      +---------+---------------+---------+-----------+----------+-------+  FV Mid    Full                                                      +---------+---------------+---------+-----------+----------+-------+  FV Distal Full                                                      +---------+---------------+---------+-----------+----------+-------+  PFV       Full                                                      +---------+---------------+---------+-----------+----------+-------+  POP       Full            Yes       Yes                             +---------+---------------+---------+-----------+----------+-------+  PTV       Full                                                      +---------+---------------+---------+-----------+----------+-------+  PERO      Full                                                      +---------+---------------+---------+-----------+----------+-------+   +---------+---------------+---------+-----------+----------+-------+  LEFT      Compressibility Phasicity Spontaneity Properties Summary  +---------+---------------+---------+-----------+----------+-------+  CFV       Full            Yes       Yes                             +---------+---------------+---------+-----------+----------+-------+  SFJ       Full                                                      +---------+---------------+---------+-----------+----------+-------+  FV Prox   Full                                                      +---------+---------------+---------+-----------+----------+-------+  FV Mid    Full                                                      +---------+---------------+---------+-----------+----------+-------+  FV Distal Full                                                      +---------+---------------+---------+-----------+----------+-------+  PFV       Full                                                       +---------+---------------+---------+-----------+----------+-------+  POP       Full            Yes       Yes                             +---------+---------------+---------+-----------+----------+-------+  PTV       Full                                                      +---------+---------------+---------+-----------+----------+-------+  PERO      Full                                                      +---------+---------------+---------+-----------+----------+-------+     Summary: Right: There is no evidence of deep vein thrombosis in the lower extremity. Left: There is no evidence of deep vein thrombosis in the lower extremity.  *See table(s) above for measurements and observations. Electronically signed by Harold Barban MD on 04/10/2019 at 12:36:25 PM.    Final     Assessment and Plan:   1. Permanent atrial fibrillation: Currently on Eliquis.  Would continue at current dose  This patients CHA2DS2-VASc Score and unadjusted Ischemic Stroke Rate (% per year) is equal to 7.2 % stroke rate/year from a score of 5  Above score calculated as 1 point each if present [CHF, HTN, DM, Vascular=MI/PAD/Aortic Plaque, Age if 65-74, or Female] Above score calculated as 2 points each if present [Age > 75, or Stroke/TIA/TE]  2. Left atrial mass: Found on transthoracic echo.  I have reviewed this with Dr. Johnsie Cancel, and it appears that the mass could be due to artifact, though a mass cannot be completely ruled out.  The best way to  rule this out would likely be a TEE, though with her symptoms of acute heart failure, would hold off on this for now.  She has been appropriately anticoagulated with 5 mg of Eliquis.  Would continue this.  If she does have a neurologic event, such as a TIA, would plan for further evaluation with a TEE.   3. Acute on chronic systolic heart failure: Ejection fraction is decreased significantly from prior.  She is currently diuresing.   Would continue with active diuresis for now with IV Lasix.  She has been diuresing well, but remains on oxygen.       For questions or updates, please contact [place] Please consult [email or web address] for contact info under     Signed, Morayma Godown Meredith Leeds, MD  04/10/2019 4:31 PM

## 2019-04-10 NOTE — Progress Notes (Signed)
PROGRESS NOTE    [name] [name]  TFT:[id number] DOB: [birth year] DOA: 04/09/2019 PCP: Cassandria Anger, MD    Brief Narrative:  HPI per Dr. Letta [name] is a 85 y.o. female with medical history significant of hypertension, hyperlipidemia, GERD, depression, atrial fibrillation on Eliquis, breast cancer (s/p of bilateral mastectomy), sCHF with EF of 45%, CKD stage III, who presents with chest pain and shortness of breath.  Patient states that she has been having shortness of breath for more than 2 days.  She has a dry cough, but no fever or chills.  Today she developed chest pain, which is located in left side of chest, initially 10 out of 10 severity, currently 4 out of 10 in severity, pleuritic, aggravated by deep breaths.  She states that she has a generalized weakness, but no unilateral tingling or numbness in extremities.  No facial droop or slurred speech.  Denies nausea, vomiting, diarrhea, abdominal pain, symptoms of UTI.  She states that she reports that since November, her health has been steadily declining and she has lost over 70 pounds unintentionally.   ED Course: pt was found to have troponin XX 6, WBC 5.4, BNP 1385, negative COVID-19 test, positive D-dimer 1.29, stable renal function, temperature normal, blood pressure 113/84, heart rate 50-1 10, oxygen saturation 94 to 96% on room air.  X-ray showed cardiomegaly and mild vascular congestion.  Patient is placed on telemetry bed for observation.  Assessment & Plan:   Principal Problem:   Acute on chronic systolic CHF (congestive heart failure) (HCC) Active Problems:   Chest pain   Hyperlipidemia with target LDL less than 100   Essential hypertension   GERD   Permanent atrial fibrillation:  CHA2DS2-VASc Score (Eliquis)   Edema of both lower extremities   Elevated troponin   CKD (chronic kidney disease), stage III (HCC)   Unintentional weight loss  1 acute on chronic systolic heart failure Questionable etiology.   Patient denies any dietary indiscretions.  Patient states she is compliant with her medications.  Patient presented with chest pain and shortness of breath.  2D echo done 01/09/2018 with a EF of 45 to 50%.  Patient noted to be volume overloaded on examination.  Chest x-ray consistent with pulmonary edema.  Troponins were elevated however flattened.  BNP of one 385.5.  Patient placed on IV Lasix with clinical improvement however not at baseline.  Patient with a urine output of 2.150 L over the past 24 hours.  Patient is -1.430 L during this hospitalization.  Current weight of 176.8 pounds from 184.5 pounds on admission.  Will discontinue Norvasc to allow for better diuresis as blood pressure somewhat borderline.  Continue Lasix 40 mg IV every 12 hours, Cozaar.  2D echo pending.  If 2D echo is abnormal will consult with cardiology.  Follow.  2.  Chest pain Likely demand ischemia secondary to problem #1.  Troponins were mildly elevated however flattened.  2D echo is pending.  D-dimer elevated.  CT angiogram chest was negative for PE.  Patient improving clinically with diuresis.  Continue IV Lasix.  Continue ARB.  If 2D echo is abnormal will consult with cardiology.  Follow for now.  3.  Chronic kidney disease stage III Stable.  Monitor with diuresis.  4.  Permanent atrial fibrillation CHA2DS2Vasc score is 5 Currently rate controlled.  Patient was on metoprolol in the past but currently not on metoprolol.  Continue Eliquis for anticoagulation.  5.  Unintentional weight loss Patient noted to have  a 70 pound unintentional weight loss.  Etiology unclear.  Patient with prior history of breast cancer.  CT abdomen and pelvis was done as well as CT angiogram chest done which was negative for metastatic disease.  Outpatient follow-up with PCP.  6.  Gastroesophageal reflux disease PPI.  7.  Hyperlipidemia Patient noted to be on pravastatin in the past.  Patient on any statin.  Fasting lipid panel with LDL 138.   Resume statin.  Outpatient follow-up.  8.  Hypokalemia Likely secondary to diuresis.  Replete.  9.  Hypertension Blood pressure borderline.  Discontinue Norvasc to allow room for diuresis.  Patient on IV Lasix.  Continue Cozaar.  Follow.   DVT prophylaxis: Eliquis Code Status: Full Family Communication: Updated patient.  No family at bedside. Disposition Plan: Likely home once euvolemic with clinical improvement.   Consultants:   None  Procedures:   2D echo pending 04/10/2019  CT angiogram chest 04/10/2019  CT abdomen and pelvis 04/10/2019  Chest x-ray 04/09/2019  Antimicrobials:   None   Subjective: Patient laying in bed.  Patient states shortness of breath improving since admission however not at baseline.  States chest pain has improved.  Objective: Vitals:   04/09/19 2115 04/09/19 2145 04/09/19 2324 04/10/19 0558  BP: (!) 126/95 130/66 (!) 133/91 (!) 109/59  Pulse: 92 97 (!) 101 85  Resp: (!) 29 18 20 18   Temp:   98.9 F (37.2 C) (!) 97.4 F (36.3 C)  TempSrc:   Oral Oral  SpO2: 97% 96% 97% 94%  Weight:   83.7 kg 80.2 kg  Height:   5\' 6"  (1.676 m)     Intake/Output Summary (Last 24 hours) at 04/10/2019 1039 Last data filed at 04/10/2019 0850 Gross per 24 hour  Intake 720 ml  Output 2150 ml  Net -1430 ml   Filed Weights   04/09/19 2324 04/10/19 0558  Weight: 83.7 kg 80.2 kg    Examination:  General exam: Appears calm and comfortable  Respiratory system: Scattered diffuse crackles.  No wheezing.  Speaking in full sentences.  Normal respiratory effort.   Cardiovascular system: S1 & S2 heard, RRR.  Positive JVD, murmurs, rubs, gallops or clicks.  1+ bilateral lower extremity edema.  Gastrointestinal system: Abdomen is nondistended, soft and nontender. No organomegaly or masses felt. Normal bowel sounds heard. Central nervous system: Alert and oriented. No focal neurological deficits. Extremities: 1+ bilateral lower extremity edema.  Skin: No rashes, lesions  or ulcers Psychiatry: Judgement and insight appear normal. Mood & affect appropriate.     Data Reviewed: I have personally reviewed following labs and imaging studies  CBC: Recent Labs  Lab 04/09/19 1738  WBC 5.4  HGB 12.8  HCT 39.2  MCV 86.0  PLT 947   Basic Metabolic Panel: Recent Labs  Lab 04/09/19 1738  NA 140  K 3.7  CL 102  CO2 26  GLUCOSE 106*  BUN 10  CREATININE 1.01*  CALCIUM 9.9   GFR: Estimated Creatinine Clearance: 44.3 mL/min (A) (by C-G formula based on SCr of 1.01 mg/dL (H)). Liver Function Tests: Recent Labs  Lab 04/09/19 1738  AST 33  ALT 28  ALKPHOS 61  BILITOT 0.6  PROT 7.0  ALBUMIN 3.7   No results for input(s): LIPASE, AMYLASE in the last 168 hours. No results for input(s): AMMONIA in the last 168 hours. Coagulation Profile: No results for input(s): INR, PROTIME in the last 168 hours. Cardiac Enzymes: No results for input(s): CKTOTAL, CKMB, CKMBINDEX, TROPONINI in the  last 168 hours. BNP (last 3 results) No results for input(s): PROBNP in the last 8760 hours. HbA1C: Recent Labs    04/10/19 0730  HGBA1C 5.6   CBG: No results for input(s): GLUCAP in the last 168 hours. Lipid Profile: Recent Labs    04/10/19 0730  CHOL 210*  HDL 62  LDLCALC 138*  TRIG 51  CHOLHDL 3.4   Thyroid Function Tests: Recent Labs    04/09/19 1847  TSH 2.691   Anemia Panel: No results for input(s): VITAMINB12, FOLATE, FERRITIN, TIBC, IRON, RETICCTPCT in the last 72 hours. Sepsis Labs: No results for input(s): PROCALCITON, LATICACIDVEN in the last 168 hours.  Recent Results (from the past 240 hour(s))  SARS Coronavirus 2 [facility] order, Performed in [facility] hospital lab) Nasopharyngeal Nasopharyngeal Swab     Status: None   Collection Time: 04/09/19  9:35 PM   Specimen: Nasopharyngeal Swab  Result Value Ref Range Status   SARS Coronavirus 2 NEGATIVE NEGATIVE Final    Comment: (NOTE) If result is NEGATIVE SARS-CoV-2 target nucleic  acids are NOT DETECTED. The SARS-CoV-2 RNA is generally detectable in upper and lower  respiratory specimens during the acute phase of infection. The lowest  concentration of SARS-CoV-2 viral copies this assay can detect is 250  copies / mL. A negative result does not preclude SARS-CoV-2 infection  and should not be used as the sole basis for treatment or other  patient management decisions.  A negative result may occur with  improper specimen collection / handling, submission of specimen other  than nasopharyngeal swab, presence of viral mutation(s) within the  areas targeted by this assay, and inadequate number of viral copies  (<250 copies / mL). A negative result must be combined with clinical  observations, patient history, and epidemiological information. If result is POSITIVE SARS-CoV-2 target nucleic acids are DETECTED. The SARS-CoV-2 RNA is generally detectable in upper and lower  respiratory specimens dur ing the acute phase of infection.  Positive  results are indicative of active infection with SARS-CoV-2.  Clinical  correlation with patient history and other diagnostic information is  necessary to determine patient infection status.  Positive results do  not rule out bacterial infection or co-infection with other viruses. If result is PRESUMPTIVE POSTIVE SARS-CoV-2 nucleic acids MAY BE PRESENT.   A presumptive positive result was obtained on the submitted specimen  and confirmed on repeat testing.  While 2019 novel coronavirus  (SARS-CoV-2) nucleic acids may be present in the submitted sample  additional confirmatory testing may be necessary for epidemiological  and / or clinical management purposes  to differentiate between  SARS-CoV-2 and other Sarbecovirus currently known to infect humans.  If clinically indicated additional testing with an alternate test  methodology [phone]) is advised. The SARS-CoV-2 RNA is generally  detectable in upper and lower respiratory  sp ecimens during the acute  phase of infection. The expected result is Negative. Fact Sheet for Patients:  StrictlyIdeas.no Fact Sheet for Healthcare Providers: BankingDealers.co.za This test is not yet approved or cleared by the [place] FDA and has been authorized for detection and/or diagnosis of SARS-CoV-2 by FDA under an Emergency Use Authorization (EUA).  This EUA will remain in effect (meaning this test can be used) for the duration of the COVID-19 declaration under Section 564(b)(1) of the Act, 21 U.S.C. section 360bbb-3(b)(1), unless the authorization is terminated or revoked sooner. Performed at [facility], [place] [address], [place], [place] [address]  Radiology Studies: Ct Angio Chest Pe W Or Wo Contrast  Result Date: 04/10/2019 CLINICAL DATA:  85 year old female with shortness of breath and chest pain. Generalized weakness. Concern for pulmonary embolism. EXAM: CT ANGIOGRAPHY CHEST CT ABDOMEN AND PELVIS WITH CONTRAST TECHNIQUE: Multidetector CT imaging of the chest was performed using the standard protocol during bolus administration of intravenous contrast. Multiplanar CT image reconstructions and MIPs were obtained to evaluate the vascular anatomy. Multidetector CT imaging of the abdomen and pelvis was performed using the standard protocol during bolus administration of intravenous contrast. CONTRAST:  166mL OMNIPAQUE IOHEXOL 350 MG/ML SOLN COMPARISON:  CT of the abdomen pelvis dated 11/07/2013 and chest radiograph dated 04/09/2019. FINDINGS: CTA CHEST FINDINGS Cardiovascular: There is moderate cardiomegaly. No pericardial effusion. There is retrograde flow of contrast from the right atrium into the IVC consistent with right heart dysfunction. There is moderate atherosclerotic calcification of the thoracic aorta. Evaluation of the aorta is limited due to non opacification and timing of the contrast. There is  coronary vascular calcification involving the LAD, RCA, and left circumflex artery. There is no CT evidence of pulmonary embolism. Mediastinum/Nodes: No hilar or mediastinal adenopathy. Esophagus is grossly unremarkable. No mediastinal fluid collection. Lungs/Pleura: There is diffuse interstitial and interlobular septal prominence consistent with edema. Confluent hazy airspace density in the region of the right hilum in the right lower lobe may represent edema or developing infiltrate. Clinical correlation and follow-up recommended. There is a small right pleural effusion. No pneumothorax. There is thickening of the bronchial wall which may represent bronchitis. Musculoskeletal: Degenerative changes of the spine. No acute osseous pathology. Surgical clips noted in the region of the axilla bilaterally. Bilateral mastectomy. Partially visualized lipoma superficial to the right infraspinatus muscle measuring up to 4.5 cm. Review of the MIP images confirms the above findings. CT ABDOMEN and PELVIS FINDINGS There is no intra-abdominal free air or free fluid. Hepatobiliary: Apparent fatty infiltration of the liver. No intrahepatic biliary ductal dilatation. No calcified gallstone or pericholecystic fluid. Pancreas: Unremarkable. No pancreatic ductal dilatation or surrounding inflammatory changes. Spleen: Normal in size without focal abnormality. Adrenals/Urinary Tract: Bilateral adrenal nodules, measuring up to 2.1 cm on the left, similar to prior CT. This nodules are not characterized but relative stability over the course of 5 years indicate a benign etiology, likely adenoma. There is no hydronephrosis on either side. There is symmetric enhancement and excretion of contrast by both kidneys. The visualized ureters and urinary bladder appear unremarkable. Stomach/Bowel: There is colonic diverticulosis without active inflammatory changes. There is no bowel obstruction. The appendix is not visualized with certainty. No  inflammatory changes identified in the right lower quadrant. Vascular/Lymphatic: Advanced aortoiliac atherosclerotic disease. No portal venous gas. There is no adenopathy. Reproductive: Hysterectomy. The ovaries are grossly unremarkable. No adnexal masses. Other: Midline vertical anterior abdominal wall incisional scar. Musculoskeletal: Degenerative changes of the spine. No acute osseous pathology. Review of the MIP images confirms the above findings. IMPRESSION: 1. No CT evidence of pulmonary embolism or aortic dissection. 2. Cardiomegaly with evidence of right heart dysfunction. 3. Small right pleural effusion with findings of CHF. Confluent airspace density in the right lower lobe may represent edema or developing infiltrate. Clinical correlation and follow-up recommended. 4. Colonic diverticulosis. No bowel obstruction or active inflammation. 5. Aortic Atherosclerosis (ICD10-I70.0). Electronically Signed   By: Anner Crete M.D.   On: 04/10/2019 02:38   Ct Abdomen Pelvis W Contrast  Result Date: 04/10/2019 CLINICAL DATA:  85 year old female with shortness of breath and chest pain.  Generalized weakness. Concern for pulmonary embolism. EXAM: CT ANGIOGRAPHY CHEST CT ABDOMEN AND PELVIS WITH CONTRAST TECHNIQUE: Multidetector CT imaging of the chest was performed using the standard protocol during bolus administration of intravenous contrast. Multiplanar CT image reconstructions and MIPs were obtained to evaluate the vascular anatomy. Multidetector CT imaging of the abdomen and pelvis was performed using the standard protocol during bolus administration of intravenous contrast. CONTRAST:  163mL OMNIPAQUE IOHEXOL 350 MG/ML SOLN COMPARISON:  CT of the abdomen pelvis dated 11/07/2013 and chest radiograph dated 04/09/2019. FINDINGS: CTA CHEST FINDINGS Cardiovascular: There is moderate cardiomegaly. No pericardial effusion. There is retrograde flow of contrast from the right atrium into the IVC consistent with right  heart dysfunction. There is moderate atherosclerotic calcification of the thoracic aorta. Evaluation of the aorta is limited due to non opacification and timing of the contrast. There is coronary vascular calcification involving the LAD, RCA, and left circumflex artery. There is no CT evidence of pulmonary embolism. Mediastinum/Nodes: No hilar or mediastinal adenopathy. Esophagus is grossly unremarkable. No mediastinal fluid collection. Lungs/Pleura: There is diffuse interstitial and interlobular septal prominence consistent with edema. Confluent hazy airspace density in the region of the right hilum in the right lower lobe may represent edema or developing infiltrate. Clinical correlation and follow-up recommended. There is a small right pleural effusion. No pneumothorax. There is thickening of the bronchial wall which may represent bronchitis. Musculoskeletal: Degenerative changes of the spine. No acute osseous pathology. Surgical clips noted in the region of the axilla bilaterally. Bilateral mastectomy. Partially visualized lipoma superficial to the right infraspinatus muscle measuring up to 4.5 cm. Review of the MIP images confirms the above findings. CT ABDOMEN and PELVIS FINDINGS There is no intra-abdominal free air or free fluid. Hepatobiliary: Apparent fatty infiltration of the liver. No intrahepatic biliary ductal dilatation. No calcified gallstone or pericholecystic fluid. Pancreas: Unremarkable. No pancreatic ductal dilatation or surrounding inflammatory changes. Spleen: Normal in size without focal abnormality. Adrenals/Urinary Tract: Bilateral adrenal nodules, measuring up to 2.1 cm on the left, similar to prior CT. This nodules are not characterized but relative stability over the course of 5 years indicate a benign etiology, likely adenoma. There is no hydronephrosis on either side. There is symmetric enhancement and excretion of contrast by both kidneys. The visualized ureters and urinary bladder  appear unremarkable. Stomach/Bowel: There is colonic diverticulosis without active inflammatory changes. There is no bowel obstruction. The appendix is not visualized with certainty. No inflammatory changes identified in the right lower quadrant. Vascular/Lymphatic: Advanced aortoiliac atherosclerotic disease. No portal venous gas. There is no adenopathy. Reproductive: Hysterectomy. The ovaries are grossly unremarkable. No adnexal masses. Other: Midline vertical anterior abdominal wall incisional scar. Musculoskeletal: Degenerative changes of the spine. No acute osseous pathology. Review of the MIP images confirms the above findings. IMPRESSION: 1. No CT evidence of pulmonary embolism or aortic dissection. 2. Cardiomegaly with evidence of right heart dysfunction. 3. Small right pleural effusion with findings of CHF. Confluent airspace density in the right lower lobe may represent edema or developing infiltrate. Clinical correlation and follow-up recommended. 4. Colonic diverticulosis. No bowel obstruction or active inflammation. 5. Aortic Atherosclerosis (ICD10-I70.0). Electronically Signed   By: Anner Crete M.D.   On: 04/10/2019 02:38   Dg Chest Portable 1 View  Result Date: 04/09/2019 CLINICAL DATA:  Generalized weakness.  Shortness of breath. EXAM: PORTABLE CHEST 1 VIEW COMPARISON:  May 23, 2014 FINDINGS: Stable cardiomegaly. The hila and mediastinum are normal. No pneumothorax. No pulmonary nodules or masses. No focal infiltrates.  No overt edema. Mild pulmonary venous congestion not excluded. IMPRESSION: Cardiomegaly. Possible mild pulmonary venous congestion. No other abnormalities. Electronically Signed   By: Dorise Bullion III M.D   On: 04/09/2019 18:25   Vas [place] Lower Extremity Venous (dvt)  Result Date: 04/10/2019  Lower Venous Study Indications: Elevated D-Dimer.  Comparison Study: No prior study available for comparison. Performing Technologist: Sharion Dove RVS  Examination Guidelines:  A complete evaluation includes B-mode imaging, spectral Doppler, color Doppler, and power Doppler as needed of all accessible portions of each vessel. Bilateral testing is considered an integral part of a complete examination. Limited examinations for reoccurring indications may be performed as noted.  +---------+---------------+---------+-----------+----------+-------+  RIGHT     Compressibility Phasicity Spontaneity Properties Summary  +---------+---------------+---------+-----------+----------+-------+  CFV       Full            Yes       Yes                             +---------+---------------+---------+-----------+----------+-------+  SFJ       Full                                                      +---------+---------------+---------+-----------+----------+-------+  FV Prox   Full                                                      +---------+---------------+---------+-----------+----------+-------+  FV Mid    Full                                                      +---------+---------------+---------+-----------+----------+-------+  FV Distal Full                                                      +---------+---------------+---------+-----------+----------+-------+  PFV       Full                                                      +---------+---------------+---------+-----------+----------+-------+  POP       Full            Yes       Yes                             +---------+---------------+---------+-----------+----------+-------+  PTV       Full                                                      +---------+---------------+---------+-----------+----------+-------+  PERO      Full                                                      +---------+---------------+---------+-----------+----------+-------+   +---------+---------------+---------+-----------+----------+-------+  LEFT      Compressibility Phasicity Spontaneity Properties Summary   +---------+---------------+---------+-----------+----------+-------+  CFV       Full            Yes       Yes                             +---------+---------------+---------+-----------+----------+-------+  SFJ       Full                                                      +---------+---------------+---------+-----------+----------+-------+  FV Prox   Full                                                      +---------+---------------+---------+-----------+----------+-------+  FV Mid    Full                                                      +---------+---------------+---------+-----------+----------+-------+  FV Distal Full                                                      +---------+---------------+---------+-----------+----------+-------+  PFV       Full                                                      +---------+---------------+---------+-----------+----------+-------+  POP       Full            Yes       Yes                             +---------+---------------+---------+-----------+----------+-------+  PTV       Full                                                      +---------+---------------+---------+-----------+----------+-------+  PERO      Full                                                      +---------+---------------+---------+-----------+----------+-------+  Summary: Right: There is no evidence of deep vein thrombosis in the lower extremity. Left: There is no evidence of deep vein thrombosis in the lower extremity.  *See table(s) above for measurements and observations.    Preliminary         Scheduled Meds:  apixaban  5 mg Oral BID   diphenhydrAMINE  25 mg Oral QHS   furosemide  40 mg Intravenous Q12H   losartan  100 mg Oral Daily   multivitamin with minerals  1 tablet Oral Daily   pantoprazole  40 mg Oral BID   sodium chloride flush  3 mL Intravenous Once   Continuous Infusions:   LOS: 0 days    Time spent: 35 minutes    Irine Seal, MD [facility]  If 7PM-7AM, please contact night-coverage [email or web address] 04/10/2019, 10:39 AM

## 2019-04-11 DIAGNOSIS — R0602 Shortness of breath: Secondary | ICD-10-CM

## 2019-04-11 DIAGNOSIS — R931 Abnormal findings on diagnostic imaging of heart and coronary circulation: Secondary | ICD-10-CM

## 2019-04-11 LAB — CBC
HCT: 36.7 % (ref 36.0–46.0)
Hemoglobin: 11.9 g/dL — ABNORMAL LOW (ref 12.0–15.0)
MCH: 27.7 pg (ref 26.0–34.0)
MCHC: 32.4 g/dL (ref 30.0–36.0)
MCV: 85.3 fL (ref 80.0–100.0)
Platelets: 185 10*3/uL (ref 150–400)
RBC: 4.3 MIL/uL (ref 3.87–5.11)
RDW: 15.7 % — ABNORMAL HIGH (ref 11.5–15.5)
WBC: 4.7 10*3/uL (ref 4.0–10.5)
nRBC: 0 % (ref 0.0–0.2)

## 2019-04-11 LAB — MAGNESIUM: Magnesium: 1.6 mg/dL — ABNORMAL LOW (ref 1.7–2.4)

## 2019-04-11 LAB — BASIC METABOLIC PANEL
Anion gap: 11 (ref 5–15)
BUN: 17 mg/dL (ref 8–23)
CO2: 32 mmol/L (ref 22–32)
Calcium: 9.2 mg/dL (ref 8.9–10.3)
Chloride: 98 mmol/L (ref 98–111)
Creatinine, Ser: 1.28 mg/dL — ABNORMAL HIGH (ref 0.44–1.00)
GFR calc Af Amer: 44 mL/min — ABNORMAL LOW (ref >60)
GFR calc non Af Amer: 38 mL/min — ABNORMAL LOW (ref >60)
Glucose, Bld: 91 mg/dL (ref 70–99)
Potassium: 3.4 mmol/L — ABNORMAL LOW (ref 3.5–5.1)
Sodium: 141 mmol/L (ref 135–145)

## 2019-04-11 MED ORDER — POTASSIUM CHLORIDE CRYS ER 20 MEQ PO TBCR
40.0000 meq | EXTENDED_RELEASE_TABLET | Freq: Once | ORAL | Status: AC
  Administered 2019-04-11: 09:00:00 40 meq via ORAL
  Filled 2019-04-11: qty 2

## 2019-04-11 MED ORDER — SACUBITRIL-VALSARTAN 49-51 MG PO TABS
1.0000 | ORAL_TABLET | Freq: Two times a day (BID) | ORAL | Status: DC
  Administered 2019-04-12 – 2019-04-13 (×2): 1 via ORAL
  Filled 2019-04-11 (×4): qty 1

## 2019-04-11 MED ORDER — MAGNESIUM SULFATE 4 GM/100ML IV SOLN
4.0000 g | Freq: Once | INTRAVENOUS | Status: AC
  Administered 2019-04-11: 4 g via INTRAVENOUS
  Filled 2019-04-11: qty 100

## 2019-04-11 MED ORDER — MAGNESIUM HYDROXIDE 400 MG/5ML PO SUSP
30.0000 mL | Freq: Every day | ORAL | Status: DC | PRN
  Administered 2019-04-11: 30 mL via ORAL
  Filled 2019-04-11: qty 30

## 2019-04-11 MED ORDER — MAGNESIUM HYDROXIDE NICU ORAL SYRINGE 400 MG/5 ML: 30.0000 mL | Freq: Every day | ORAL | Status: DC | PRN

## 2019-04-11 NOTE — Discharge Instructions (Signed)

## 2019-04-11 NOTE — Progress Notes (Signed)
[facility] PROGRESS NOTE  [name] [name] XHB:[id number] DOB: 19-Jun-1933 DOA: 04/09/2019 PCP: Cassandria Anger, MD  Assessment/Plan: 1 acute on chronic systolic heart failure Questionable etiology.  Patient denies any dietary indiscretions.  Patient states she is compliant with her medications.  Patient presented with chest pain and shortness of breath.  2D echo done 04/10/19 with a EF of 20-25%, left ventricular hypokinesis, akinesis of basal and mid anteroseptum as well as 2.3x1.4cm mass in posterior LA of unclear significance.  Patient noted to be volume overloaded on examination.  Chest x-ray consistent with pulmonary edema.  Troponins were elevated however flattened.  BNP of one 385.5.  Patient placed on IV Lasix with clinical improvement however not at baseline.  Patient with a urine output of 1.7L overnight. Current weight of 178.5 pounds from 184.5 pounds on admission. Norvasc discontinued  to allow for better diuresis as blood pressure somewhat borderline. Evaluated by cardiology who recommend continuing IV lasix and repeating imaging for ?mass in 1-3 months as well as continuing anticoagulation - Continue Lasix 40 mg IV every 12 hours -discontinue Cozaar and Entresto started per cardiology.  -continue eliquis -repeat imaging (TEE?) in 1-3 months to evaluate mass vs thrombus in the LA as well as LV function    2.  Chest pain Likely demand ischemia secondary to problem #1. Pain free this am. Troponins were mildly elevated however flattened.  Echo noted above.  D-dimer elevated.  CT angiogram chest was negative for PE.  - Continue IV Lasix.  - Continue ARB.  -mobilize  3.  Chronic kidney disease stage III creatinine 1.28 this am trending up from 1.01. likely related to IV lasix. -continuing with IV lasix as noted above -hold other nephrotoxins as able -monitor urine output - Monitor with diuresis.  4.  Permanent atrial fibrillation CHA2DS2Vasc score is 5 Currently rate  controlled.  Patient was on metoprolol in the past but currently not on metoprolol. - Continue Eliquis for anticoagulation.  5.  Unintentional weight loss Patient noted to have a 70 pound unintentional weight loss.  Etiology unclear.  Patient with prior history of breast cancer.  CT abdomen and pelvis was done as well as CT angiogram chest done which was negative for metastatic disease.  Outpatient follow-up with PCP.  6.  Gastroesophageal reflux disease PPI.  7.  Hyperlipidemia Patient noted to be on pravastatin in the past.  Patient on any statin.  Fasting lipid panel with LDL 138.  Resume statin.  Outpatient follow-up.  8.  Hypokalemia Likely secondary to diuresis.  - Replete and recheck  9.  Hypertension Blood pressure low end of normal. Home norvasc was discontinued to allow room for diuresis. - Patient on IV Lasix.  - Continue Cozaar.   -monitor    Code Status: full Family Communication: patient updated Disposition Plan: home hopefully tomorrow   Consultants:  [facility]  Procedures:  Echo  CT angiogram chest  CT abdomen and pelvis  Chest xray  Antibiotics:    HPI/Subjective: Sitting up in bed watching tv. Reports having slept well last night and feeling "much better".  Denies chest pain  Objective: Vitals:   04/11/19 0448 04/11/19 0825  BP: 109/64 102/65  Pulse: 79 73  Resp: 20 18  Temp: 98.2 F (36.8 C) 98.1 F (36.7 C)  SpO2: 98% 98%    Intake/Output Summary (Last 24 hours) at 04/11/2019 1037 Last data filed at 04/11/2019 0755 Gross per 24 hour  Intake 800 ml  Output 900 ml  Net -  100 ml   Filed Weights   04/10/19 0558 04/11/19 0448 04/11/19 0500  Weight: 80.2 kg 81 kg 81 kg    Exam:   General:  Awake alert no acute distress  Cardiovascular: irregularly irregular no mgr trace LE edema  Respiratory: normal effort BS distant but clear no wheeze  Abdomen: obese soft +BS no guarding or rebounding  Musculoskeletal:  joints without swelling/erythema   Data Reviewed: Basic Metabolic Panel: Recent Labs  Lab 04/09/19 1738 04/10/19 1119 04/11/19 0620  NA 140 140 141  K 3.7 3.3* 3.4*  CL 102 101 98  CO2 26 29 32  GLUCOSE 106* 124* 91  BUN 10 11 17   CREATININE 1.01* 0.93 1.28*  CALCIUM 9.9 9.2 9.2  MG  --   --  1.6*   Liver Function Tests: Recent Labs  Lab 04/09/19 1738  AST 33  ALT 28  ALKPHOS 61  BILITOT 0.6  PROT 7.0  ALBUMIN 3.7   No results for input(s): LIPASE, AMYLASE in the last 168 hours. No results for input(s): AMMONIA in the last 168 hours. CBC: Recent Labs  Lab 04/09/19 1738 04/11/19 0620  WBC 5.4 4.7  HGB 12.8 11.9*  HCT 39.2 36.7  MCV 86.0 85.3  PLT 211 185   Cardiac Enzymes: No results for input(s): CKTOTAL, CKMB, CKMBINDEX, TROPONINI in the last 168 hours. BNP (last 3 results) Recent Labs    04/09/19 2201  BNP 1,385.5*    ProBNP (last 3 results) No results for input(s): PROBNP in the last 8760 hours.  CBG: No results for input(s): GLUCAP in the last 168 hours.  Recent Results (from the past 240 hour(s))  SARS Coronavirus 2 [facility] order, Performed in [facility] hospital lab) Nasopharyngeal Nasopharyngeal Swab     Status: None   Collection Time: 04/09/19  9:35 PM   Specimen: Nasopharyngeal Swab  Result Value Ref Range Status   SARS Coronavirus 2 NEGATIVE NEGATIVE Final    Comment: (NOTE) If result is NEGATIVE SARS-CoV-2 target nucleic acids are NOT DETECTED. The SARS-CoV-2 RNA is generally detectable in upper and lower  respiratory specimens during the acute phase of infection. The lowest  concentration of SARS-CoV-2 viral copies this assay can detect is 250  copies / mL. A negative result does not preclude SARS-CoV-2 infection  and should not be used as the sole basis for treatment or other  patient management decisions.  A negative result may occur with  improper specimen collection / handling, submission of specimen other  than  nasopharyngeal swab, presence of viral mutation(s) within the  areas targeted by this assay, and inadequate number of viral copies  (<250 copies / mL). A negative result must be combined with clinical  observations, patient history, and epidemiological information. If result is POSITIVE SARS-CoV-2 target nucleic acids are DETECTED. The SARS-CoV-2 RNA is generally detectable in upper and lower  respiratory specimens dur ing the acute phase of infection.  Positive  results are indicative of active infection with SARS-CoV-2.  Clinical  correlation with patient history and other diagnostic information is  necessary to determine patient infection status.  Positive results do  not rule out bacterial infection or co-infection with other viruses. If result is PRESUMPTIVE POSTIVE SARS-CoV-2 nucleic acids MAY BE PRESENT.   A presumptive positive result was obtained on the submitted specimen  and confirmed on repeat testing.  While 2019 novel coronavirus  (SARS-CoV-2) nucleic acids may be present in the submitted sample  additional confirmatory testing may be necessary for epidemiological  and / or clinical management purposes  to differentiate between  SARS-CoV-2 and other Sarbecovirus currently known to infect humans.  If clinically indicated additional testing with an alternate test  methodology [id number]) is advised. The SARS-CoV-2 RNA is generally  detectable in upper and lower respiratory sp ecimens during the acute  phase of infection. The expected result is Negative. Fact Sheet for Patients:  StrictlyIdeas.no Fact Sheet for Healthcare Providers: BankingDealers.co.za This test is not yet approved or cleared by the [place] FDA and has been authorized for detection and/or diagnosis of SARS-CoV-2 by FDA under an Emergency Use Authorization (EUA).  This EUA will remain in effect (meaning this test can be used) for the duration of  the COVID-19 declaration under Section 564(b)(1) of the Act, 21 U.S.C. section 360bbb-3(b)(1), unless the authorization is terminated or revoked sooner. Performed at [facility], [place] [address] [place], [place] [address]      Studies: Ct Angio Chest Pe W Or Wo Contrast  Result Date: 04/10/2019 CLINICAL DATA:  85 year old female with shortness of breath and chest pain. Generalized weakness. Concern for pulmonary embolism. EXAM: CT ANGIOGRAPHY CHEST CT ABDOMEN AND PELVIS WITH CONTRAST TECHNIQUE: Multidetector CT imaging of the chest was performed using the standard protocol during bolus administration of intravenous contrast. Multiplanar CT image reconstructions and MIPs were obtained to evaluate the vascular anatomy. Multidetector CT imaging of the abdomen and pelvis was performed using the standard protocol during bolus administration of intravenous contrast. CONTRAST:  174mL OMNIPAQUE IOHEXOL 350 MG/ML SOLN COMPARISON:  CT of the abdomen pelvis dated 11/07/2013 and chest radiograph dated 04/09/2019. FINDINGS: CTA CHEST FINDINGS Cardiovascular: There is moderate cardiomegaly. No pericardial effusion. There is retrograde flow of contrast from the right atrium into the IVC consistent with right heart dysfunction. There is moderate atherosclerotic calcification of the thoracic aorta. Evaluation of the aorta is limited due to non opacification and timing of the contrast. There is coronary vascular calcification involving the LAD, RCA, and left circumflex artery. There is no CT evidence of pulmonary embolism. Mediastinum/Nodes: No hilar or mediastinal adenopathy. Esophagus is grossly unremarkable. No mediastinal fluid collection. Lungs/Pleura: There is diffuse interstitial and interlobular septal prominence consistent with edema. Confluent hazy airspace density in the region of the right hilum in the right lower lobe may represent edema or developing infiltrate. Clinical correlation and follow-up  recommended. There is a small right pleural effusion. No pneumothorax. There is thickening of the bronchial wall which may represent bronchitis. Musculoskeletal: Degenerative changes of the spine. No acute osseous pathology. Surgical clips noted in the region of the axilla bilaterally. Bilateral mastectomy. Partially visualized lipoma superficial to the right infraspinatus muscle measuring up to 4.5 cm. Review of the MIP images confirms the above findings. CT ABDOMEN and PELVIS FINDINGS There is no intra-abdominal free air or free fluid. Hepatobiliary: Apparent fatty infiltration of the liver. No intrahepatic biliary ductal dilatation. No calcified gallstone or pericholecystic fluid. Pancreas: Unremarkable. No pancreatic ductal dilatation or surrounding inflammatory changes. Spleen: Normal in size without focal abnormality. Adrenals/Urinary Tract: Bilateral adrenal nodules, measuring up to 2.1 cm on the left, similar to prior CT. This nodules are not characterized but relative stability over the course of 5 years indicate a benign etiology, likely adenoma. There is no hydronephrosis on either side. There is symmetric enhancement and excretion of contrast by both kidneys. The visualized ureters and urinary bladder appear unremarkable. Stomach/Bowel: There is colonic diverticulosis without active inflammatory changes. There is no bowel obstruction. The appendix is not  visualized with certainty. No inflammatory changes identified in the right lower quadrant. Vascular/Lymphatic: Advanced aortoiliac atherosclerotic disease. No portal venous gas. There is no adenopathy. Reproductive: Hysterectomy. The ovaries are grossly unremarkable. No adnexal masses. Other: Midline vertical anterior abdominal wall incisional scar. Musculoskeletal: Degenerative changes of the spine. No acute osseous pathology. Review of the MIP images confirms the above findings. IMPRESSION: 1. No CT evidence of pulmonary embolism or aortic dissection.  2. Cardiomegaly with evidence of right heart dysfunction. 3. Small right pleural effusion with findings of CHF. Confluent airspace density in the right lower lobe may represent edema or developing infiltrate. Clinical correlation and follow-up recommended. 4. Colonic diverticulosis. No bowel obstruction or active inflammation. 5. Aortic Atherosclerosis (ICD10-I70.0). Electronically Signed   By: Anner Crete M.D.   On: 04/10/2019 02:38   Ct Abdomen Pelvis W Contrast  Result Date: 04/10/2019 CLINICAL DATA:  85 year old female with shortness of breath and chest pain. Generalized weakness. Concern for pulmonary embolism. EXAM: CT ANGIOGRAPHY CHEST CT ABDOMEN AND PELVIS WITH CONTRAST TECHNIQUE: Multidetector CT imaging of the chest was performed using the standard protocol during bolus administration of intravenous contrast. Multiplanar CT image reconstructions and MIPs were obtained to evaluate the vascular anatomy. Multidetector CT imaging of the abdomen and pelvis was performed using the standard protocol during bolus administration of intravenous contrast. CONTRAST:  133mL OMNIPAQUE IOHEXOL 350 MG/ML SOLN COMPARISON:  CT of the abdomen pelvis dated 11/07/2013 and chest radiograph dated 04/09/2019. FINDINGS: CTA CHEST FINDINGS Cardiovascular: There is moderate cardiomegaly. No pericardial effusion. There is retrograde flow of contrast from the right atrium into the IVC consistent with right heart dysfunction. There is moderate atherosclerotic calcification of the thoracic aorta. Evaluation of the aorta is limited due to non opacification and timing of the contrast. There is coronary vascular calcification involving the LAD, RCA, and left circumflex artery. There is no CT evidence of pulmonary embolism. Mediastinum/Nodes: No hilar or mediastinal adenopathy. Esophagus is grossly unremarkable. No mediastinal fluid collection. Lungs/Pleura: There is diffuse interstitial and interlobular septal prominence consistent  with edema. Confluent hazy airspace density in the region of the right hilum in the right lower lobe may represent edema or developing infiltrate. Clinical correlation and follow-up recommended. There is a small right pleural effusion. No pneumothorax. There is thickening of the bronchial wall which may represent bronchitis. Musculoskeletal: Degenerative changes of the spine. No acute osseous pathology. Surgical clips noted in the region of the axilla bilaterally. Bilateral mastectomy. Partially visualized lipoma superficial to the right infraspinatus muscle measuring up to 4.5 cm. Review of the MIP images confirms the above findings. CT ABDOMEN and PELVIS FINDINGS There is no intra-abdominal free air or free fluid. Hepatobiliary: Apparent fatty infiltration of the liver. No intrahepatic biliary ductal dilatation. No calcified gallstone or pericholecystic fluid. Pancreas: Unremarkable. No pancreatic ductal dilatation or surrounding inflammatory changes. Spleen: Normal in size without focal abnormality. Adrenals/Urinary Tract: Bilateral adrenal nodules, measuring up to 2.1 cm on the left, similar to prior CT. This nodules are not characterized but relative stability over the course of 5 years indicate a benign etiology, likely adenoma. There is no hydronephrosis on either side. There is symmetric enhancement and excretion of contrast by both kidneys. The visualized ureters and urinary bladder appear unremarkable. Stomach/Bowel: There is colonic diverticulosis without active inflammatory changes. There is no bowel obstruction. The appendix is not visualized with certainty. No inflammatory changes identified in the right lower quadrant. Vascular/Lymphatic: Advanced aortoiliac atherosclerotic disease. No portal venous gas. There is no adenopathy. Reproductive:  Hysterectomy. The ovaries are grossly unremarkable. No adnexal masses. Other: Midline vertical anterior abdominal wall incisional scar. Musculoskeletal:  Degenerative changes of the spine. No acute osseous pathology. Review of the MIP images confirms the above findings. IMPRESSION: 1. No CT evidence of pulmonary embolism or aortic dissection. 2. Cardiomegaly with evidence of right heart dysfunction. 3. Small right pleural effusion with findings of CHF. Confluent airspace density in the right lower lobe may represent edema or developing infiltrate. Clinical correlation and follow-up recommended. 4. Colonic diverticulosis. No bowel obstruction or active inflammation. 5. Aortic Atherosclerosis (ICD10-I70.0). Electronically Signed   By: Anner Crete M.D.   On: 04/10/2019 02:38   Dg Chest Portable 1 View  Result Date: 04/09/2019 CLINICAL DATA:  Generalized weakness.  Shortness of breath. EXAM: PORTABLE CHEST 1 VIEW COMPARISON:  May 23, 2014 FINDINGS: Stable cardiomegaly. The hila and mediastinum are normal. No pneumothorax. No pulmonary nodules or masses. No focal infiltrates. No overt edema. Mild pulmonary venous congestion not excluded. IMPRESSION: Cardiomegaly. Possible mild pulmonary venous congestion. No other abnormalities. Electronically Signed   By: Dorise Bullion III M.D   On: 04/09/2019 18:25   Vas [place] Lower Extremity Venous (dvt)  Result Date: 04/10/2019  Lower Venous Study Indications: Elevated D-Dimer.  Comparison Study: No prior study available for comparison. Performing Technologist: Sharion Dove RVS  Examination Guidelines: A complete evaluation includes B-mode imaging, spectral Doppler, color Doppler, and power Doppler as needed of all accessible portions of each vessel. Bilateral testing is considered an integral part of a complete examination. Limited examinations for reoccurring indications may be performed as noted.  +---------+---------------+---------+-----------+----------+-------+ RIGHT    CompressibilityPhasicitySpontaneityPropertiesSummary +---------+---------------+---------+-----------+----------+-------+ CFV       Full           Yes      Yes                          +---------+---------------+---------+-----------+----------+-------+ SFJ      Full                                                 +---------+---------------+---------+-----------+----------+-------+ FV Prox  Full                                                 +---------+---------------+---------+-----------+----------+-------+ FV Mid   Full                                                 +---------+---------------+---------+-----------+----------+-------+ FV DistalFull                                                 +---------+---------------+---------+-----------+----------+-------+ PFV      Full                                                 +---------+---------------+---------+-----------+----------+-------+  POP      Full           Yes      Yes                          +---------+---------------+---------+-----------+----------+-------+ PTV      Full                                                 +---------+---------------+---------+-----------+----------+-------+ PERO     Full                                                 +---------+---------------+---------+-----------+----------+-------+   +---------+---------------+---------+-----------+----------+-------+ LEFT     CompressibilityPhasicitySpontaneityPropertiesSummary +---------+---------------+---------+-----------+----------+-------+ CFV      Full           Yes      Yes                          +---------+---------------+---------+-----------+----------+-------+ SFJ      Full                                                 +---------+---------------+---------+-----------+----------+-------+ FV Prox  Full                                                 +---------+---------------+---------+-----------+----------+-------+ FV Mid   Full                                                  +---------+---------------+---------+-----------+----------+-------+ FV DistalFull                                                 +---------+---------------+---------+-----------+----------+-------+ PFV      Full                                                 +---------+---------------+---------+-----------+----------+-------+ POP      Full           Yes      Yes                          +---------+---------------+---------+-----------+----------+-------+ PTV      Full                                                 +---------+---------------+---------+-----------+----------+-------+ PERO  Full                                                 +---------+---------------+---------+-----------+----------+-------+     Summary: Right: There is no evidence of deep vein thrombosis in the lower extremity. Left: There is no evidence of deep vein thrombosis in the lower extremity.  *See table(s) above for measurements and observations. Electronically signed by Harold Barban MD on 04/10/2019 at 12:36:25 PM.    Final     Scheduled Meds: . apixaban  5 mg Oral BID  . diphenhydrAMINE  25 mg Oral QHS  . furosemide  40 mg Intravenous Q12H  . multivitamin with minerals  1 tablet Oral Daily  . pantoprazole  40 mg Oral BID  . pravastatin  20 mg Oral q1800  . [START ON 04/12/2019] sacubitril-valsartan  1 tablet Oral BID  . sodium chloride flush  3 mL Intravenous Once   Continuous Infusions: . magnesium sulfate bolus IVPB 4 g (04/11/19 0840)    Principal Problem:   Acute on chronic systolic CHF (congestive heart failure) (HCC) Active Problems:   Hyperlipidemia with target LDL less than 100   Essential hypertension   GERD   Permanent atrial fibrillation:  CHA2DS2-VASc Score (Eliquis)   Edema of both lower extremities   Chest pain   Elevated troponin   CKD (chronic kidney disease), stage III ([place])   Unintentional weight loss    Time spent: 40 minutes    [facility] M  NP [facility]  If 7PM-7AM, please contact night-coverage at [email or web address], password [facility] 04/11/2019, 10:37 AM  LOS: 1 day

## 2019-04-11 NOTE — Progress Notes (Signed)
DAILY PROGRESS NOTE   Patient Name: [name] [name] Date of Encounter: 04/11/2019 Cardiologist: Glenetta Hew, MD  Chief Complaint   Breathing has improved  Patient Profile   85 yo female with acute on chronic systolic CHF, permanent afib and CKD 3 with newly reduced LVEF to 25% and possible LA mass on echo  Subjective   Diuresed 1.7L negative overnight. Breathing is better today. EKG shows afib with wide QRS, but not LBBB.  Objective   Vitals:   04/11/19 0052 04/11/19 0448 04/11/19 0500 04/11/19 0825  BP: (!) 91/59 109/64  102/65  Pulse: 63 79  73  Resp: 18 20  18   Temp:  98.2 F (36.8 C)  98.1 F (36.7 C)  TempSrc:  Oral  Oral  SpO2: 96% 98%  98%  Weight:  81 kg 81 kg   Height:        Intake/Output Summary (Last 24 hours) at 04/11/2019 0911 Last data filed at 04/11/2019 0755 Gross per 24 hour  Intake 800 ml  Output 900 ml  Net -100 ml   Filed Weights   04/10/19 0558 04/11/19 0448 04/11/19 0500  Weight: 80.2 kg 81 kg 81 kg    Physical Exam   General appearance: alert, no distress and mildly obese Neck: JVD - 3 cm above sternal notch and no carotid bruit Lungs: diminished breath sounds bibasilar Heart: irregularly irregular rhythm Abdomen: soft, non-tender; bowel sounds normal; no masses,  no organomegaly Extremities: edema trace pretibial Pulses: 2+ and symmetric Skin: Skin color, texture, turgor normal. No rashes or lesions Neurologic: Mental status: Alert, oriented, thought content appropriate Psych: Pleasant  Inpatient Medications    Scheduled Meds:  apixaban  5 mg Oral BID   diphenhydrAMINE  25 mg Oral QHS   furosemide  40 mg Intravenous Q12H   losartan  100 mg Oral Daily   multivitamin with minerals  1 tablet Oral Daily   pantoprazole  40 mg Oral BID   pravastatin  20 mg Oral q1800   sodium chloride flush  3 mL Intravenous Once    Continuous Infusions:  magnesium sulfate bolus IVPB 4 g (04/11/19 0840)    PRN  Meds: acetaminophen, dextromethorphan-guaiFENesin, hydrALAZINE, levalbuterol, morphine injection, nitroGLYCERIN, ondansetron (ZOFRAN) IV, zolpidem   Labs   Results for orders placed or performed during the hospital encounter of 04/09/19 (from the past 48 hour(s))  Basic metabolic panel     Status: Abnormal   Collection Time: 04/09/19  5:38 PM  Result Value Ref Range   Sodium 140 135 - 145 mmol/L   Potassium 3.7 3.5 - 5.1 mmol/L   Chloride 102 98 - 111 mmol/L   CO2 26 22 - 32 mmol/L   Glucose, Bld 106 (H) 70 - 99 mg/dL   BUN 10 8 - 23 mg/dL   Creatinine, Ser 1.01 (H) 0.44 - 1.00 mg/dL   Calcium 9.9 8.9 - 10.3 mg/dL   GFR calc non Af Amer 51 (L) >60 mL/min   GFR calc Af Amer 59 (L) >60 mL/min   Anion gap 12 5 - 15    Comment: Performed at [facility], [address] [place] [address]  CBC     Status: Abnormal   Collection Time: 04/09/19  5:38 PM  Result Value Ref Range   WBC 5.4 4.0 - 10.5 K/uL   RBC 4.56 3.87 - 5.11 MIL/uL   Hemoglobin 12.8 12.0 - 15.0 g/dL   HCT 39.2 36.0 - 46.0 %   MCV  86.0 80.0 - 100.0 fL   MCH 28.1 26.0 - 34.0 pg   MCHC 32.7 30.0 - 36.0 g/dL   RDW 15.8 (H) 11.5 - 15.5 %   Platelets 211 150 - 400 K/uL   nRBC 0.0 0.0 - 0.2 %    Comment: Performed at [facility] [address], [place], [place] [address]  Hepatic function panel     Status: None   Collection Time: 04/09/19  5:38 PM  Result Value Ref Range   Total Protein 7.0 6.5 - 8.1 g/dL   Albumin 3.7 3.5 - 5.0 g/dL   AST 33 15 - 41 U/L   ALT 28 0 - 44 U/L   Alkaline Phosphatase 61 38 - 126 U/L   Total Bilirubin 0.6 0.3 - 1.2 mg/dL   Bilirubin, Direct 0.1 0.0 - 0.2 mg/dL   Indirect Bilirubin 0.5 0.3 - 0.9 mg/dL    Comment: Performed at [facility] [address] [place], [place] [address]  TSH     Status: None   Collection Time: 04/09/19  6:47 PM  Result Value Ref Range   TSH 2.691 0.350 - 4.500 uIU/mL    Comment: Performed by a 3rd Generation assay with a  functional sensitivity of <=0.01 uIU/mL. Performed at [facility], [place] [address] [place], [place] [address]   Urinalysis, Routine w reflex microscopic     Status: Abnormal   Collection Time: 04/09/19  6:52 PM  Result Value Ref Range   Color, Urine YELLOW YELLOW   APPearance CLEAR CLEAR   Specific Gravity, Urine 1.006 1.005 - 1.030   pH 5.0 5.0 - 8.0   Glucose, UA NEGATIVE NEGATIVE mg/dL   Hgb urine dipstick MODERATE (A) NEGATIVE   Bilirubin Urine NEGATIVE NEGATIVE   Ketones, ur NEGATIVE NEGATIVE mg/dL   Protein, ur NEGATIVE NEGATIVE mg/dL   Nitrite NEGATIVE NEGATIVE   Leukocytes,Ua NEGATIVE NEGATIVE   RBC / HPF 0-5 0 - 5 RBC/hpf   WBC, UA 0-5 0 - 5 WBC/hpf   Bacteria, UA NONE SEEN NONE SEEN   Squamous Epithelial / LPF 0-5 0 - 5   Mucus PRESENT    Hyaline Casts, UA PRESENT     Comment: Performed at [facility] [address] [place], [place] [address]  Troponin I (High Sensitivity)     Status: Abnormal   Collection Time: 04/09/19  7:57 PM  Result Value Ref Range   Troponin I (High Sensitivity) 26 (H) <18 ng/L    Comment: (NOTE) Elevated high sensitivity troponin I (hsTnI) values and significant  changes across serial measurements may suggest ACS but many other  chronic and acute conditions are known to elevate hsTnI results.  Refer to the "Links" section for chest pain algorithms and additional  guidance. Performed at [facility], [place] [address] [place], [place] [address]   SARS Coronavirus 2 [facility] order, Performed in [facility] hospital lab) Nasopharyngeal Nasopharyngeal Swab     Status: None   Collection Time: 04/09/19  9:35 PM   Specimen: Nasopharyngeal Swab  Result Value Ref Range   SARS Coronavirus 2 NEGATIVE NEGATIVE    Comment: (NOTE) If result is NEGATIVE SARS-CoV-2 target nucleic acids are NOT DETECTED. The SARS-CoV-2 RNA is generally detectable in upper and lower  respiratory specimens during the acute phase of infection. The  lowest  concentration of SARS-CoV-2 viral copies this assay can detect is 250  copies / mL. A negative result does not preclude  SARS-CoV-2 infection  and should not be used as the sole basis for treatment or other  patient management decisions.  A negative result may occur with  improper specimen collection / handling, submission of specimen other  than nasopharyngeal swab, presence of viral mutation(s) within the  areas targeted by this assay, and inadequate number of viral copies  (<250 copies / mL). A negative result must be combined with clinical  observations, patient history, and epidemiological information. If result is POSITIVE SARS-CoV-2 target nucleic acids are DETECTED. The SARS-CoV-2 RNA is generally detectable in upper and lower  respiratory specimens dur ing the acute phase of infection.  Positive  results are indicative of active infection with SARS-CoV-2.  Clinical  correlation with patient history and other diagnostic information is  necessary to determine patient infection status.  Positive results do  not rule out bacterial infection or co-infection with other viruses. If result is PRESUMPTIVE POSTIVE SARS-CoV-2 nucleic acids MAY BE PRESENT.   A presumptive positive result was obtained on the submitted specimen  and confirmed on repeat testing.  While 2019 novel coronavirus  (SARS-CoV-2) nucleic acids may be present in the submitted sample  additional confirmatory testing may be necessary for epidemiological  and / or clinical management purposes  to differentiate between  SARS-CoV-2 and other Sarbecovirus currently known to infect humans.  If clinically indicated additional testing with an alternate test  methodology [phone]) is advised. The SARS-CoV-2 RNA is generally  detectable in upper and lower respiratory sp ecimens during the acute  phase of infection. The expected result is Negative. Fact Sheet for Patients:   StrictlyIdeas.no Fact Sheet for Healthcare Providers: BankingDealers.co.za This test is not yet approved or cleared by the [place] FDA and has been authorized for detection and/or diagnosis of SARS-CoV-2 by FDA under an Emergency Use Authorization (EUA).  This EUA will remain in effect (meaning this test can be used) for the duration of the COVID-19 declaration under Section 564(b)(1) of the Act, 21 U.S.C. section 360bbb-3(b)(1), unless the authorization is terminated or revoked sooner. Performed at [facility], [place] [address], [place], [place] [address]   Brain natriuretic peptide     Status: Abnormal   Collection Time: 04/09/19 10:01 PM  Result Value Ref Range   B Natriuretic Peptide 1,385.5 (H) 0.0 - 100.0 pg/mL    Comment: Performed at [facility] [address], [place], [place] [address]  Troponin I (High Sensitivity)     Status: Abnormal   Collection Time: 04/09/19 10:01 PM  Result Value Ref Range   Troponin I (High Sensitivity) 26 (H) <18 ng/L    Comment: (NOTE) Elevated high sensitivity troponin I (hsTnI) values and significant  changes across serial measurements may suggest ACS but many other  chronic and acute conditions are known to elevate hsTnI results.  Refer to the "Links" section for chest pain algorithms and additional  guidance. Performed at [facility], [place] [address], [place],  [address]   D-dimer, quantitative (not at [facility])     Status: Abnormal   Collection Time: 04/09/19 10:24 PM  Result Value Ref Range   D-Dimer, Quant 1.29 (H) 0.00 - 0.50 ug/mL-FEU    Comment: (NOTE) At the manufacturer cut-off of 0.50 ug/mL FEU, this assay has been documented to exclude PE with a sensitivity and negative predictive value of 97 to 99%.  At this time, this assay has not been approved by the FDA to exclude DVT/VTE. Results should be correlated with  clinical presentation. Performed at [facility], [place] [address], [place], [place] [address]   Rapid urine drug screen (hospital performed)     Status: None   Collection Time: 04/10/19 12:24 AM  Result Value Ref Range   Opiates NONE DETECTED NONE DETECTED   Cocaine NONE DETECTED NONE DETECTED   Benzodiazepines NONE DETECTED NONE DETECTED   Amphetamines NONE DETECTED NONE DETECTED   Tetrahydrocannabinol NONE DETECTED NONE DETECTED   Barbiturates NONE DETECTED NONE DETECTED    Comment: (NOTE) DRUG SCREEN FOR MEDICAL PURPOSES ONLY.  IF CONFIRMATION IS NEEDED FOR ANY PURPOSE, NOTIFY LAB WITHIN 5 DAYS. LOWEST DETECTABLE LIMITS FOR URINE DRUG SCREEN Drug Class                     Cutoff (ng/mL) Amphetamine and metabolites    1000 Barbiturate and metabolites    200 Benzodiazepine                 517 Tricyclics and metabolites     300 Opiates and metabolites        300 Cocaine and metabolites        300 THC                            50 Performed at [facility], [place] [address], [place], [place] [address]   Hemoglobin A1c     Status: None   Collection Time: 04/10/19  7:30 AM  Result Value Ref Range   Hgb A1c MFr Bld 5.6 4.8 - 5.6 %    Comment: (NOTE) Pre diabetes:          5.7%-6.4% Diabetes:              >6.4% Glycemic control for   <7.0% adults with diabetes    Mean Plasma Glucose 114.02 mg/dL    Comment: Performed at [facility] [address], [place], [place] [address]  Lipid panel     Status: Abnormal   Collection Time: 04/10/19  7:30 AM  Result Value Ref Range   Cholesterol 210 (H) 0 - 200 mg/dL   Triglycerides 51 <150 mg/dL   HDL 62 >40 mg/dL   Total CHOL/HDL Ratio 3.4 RATIO   VLDL 10 0 - 40 mg/dL   LDL Cholesterol 138 (H) 0 - 99 mg/dL    Comment:        Total Cholesterol/HDL:CHD Risk Coronary Heart Disease Risk Table                     Men   Women  1/2 Average Risk   3.4   3.3  Average Risk       5.0   4.4  2 X Average Risk   9.6   7.1  3 X Average Risk  23.4   11.0        Use  the calculated Patient Ratio above and the CHD Risk Table to determine the patient's CHD Risk.        ATP III CLASSIFICATION (LDL):  <100     mg/dL   Optimal  100-129  mg/dL   Near or Above                    Optimal  130-159  mg/dL   Borderline  160-189  mg/dL   High  >190     mg/dL   Very High Performed at  [facility], [place] [address], [place], [place] [address]   Basic metabolic panel     Status: Abnormal   Collection Time: 04/10/19 11:19 AM  Result Value Ref Range   Sodium 140 135 - 145 mmol/L   Potassium 3.3 (L) 3.5 - 5.1 mmol/L   Chloride 101 98 - 111 mmol/L   CO2 29 22 - 32 mmol/L   Glucose, Bld 124 (H) 70 - 99 mg/dL   BUN 11 8 - 23 mg/dL   Creatinine, Ser 0.93 0.44 - 1.00 mg/dL   Calcium 9.2 8.9 - 10.3 mg/dL   GFR calc non Af Amer 56 (L) >60 mL/min   GFR calc Af Amer >60 >60 mL/min   Anion gap 10 5 - 15    Comment: Performed at [facility] [address], [place], [place] [address]  CBC     Status: Abnormal   Collection Time: 04/11/19  6:20 AM  Result Value Ref Range   WBC 4.7 4.0 - 10.5 K/uL   RBC 4.30 3.87 - 5.11 MIL/uL   Hemoglobin 11.9 (L) 12.0 - 15.0 g/dL   HCT 36.7 36.0 - 46.0 %   MCV 85.3 80.0 - 100.0 fL   MCH 27.7 26.0 - 34.0 pg   MCHC 32.4 30.0 - 36.0 g/dL   RDW 15.7 (H) 11.5 - 15.5 %   Platelets 185 150 - 400 K/uL   nRBC 0.0 0.0 - 0.2 %    Comment: Performed at [facility], [place] [address] [place], [place] [address]  Basic metabolic panel     Status: Abnormal   Collection Time: 04/11/19  6:20 AM  Result Value Ref Range   Sodium 141 135 - 145 mmol/L   Potassium 3.4 (L) 3.5 - 5.1 mmol/L   Chloride 98 98 - 111 mmol/L   CO2 32 22 - 32 mmol/L   Glucose, Bld 91 70 - 99 mg/dL   BUN 17 8 - 23 mg/dL   Creatinine, Ser 1.28 (H) 0.44 - 1.00 mg/dL   Calcium 9.2 8.9 - 10.3 mg/dL   GFR calc non Af Amer 38 (L) >60 mL/min   GFR calc Af Amer 44 (L) >60 mL/min   Anion gap 11 5 - 15    Comment: Performed at [facility], [address], [place], [place] [address]  Magnesium     Status: Abnormal   Collection Time: 04/11/19  6:20 AM  Result Value Ref Range   Magnesium 1.6 (L) 1.7 - 2.4 mg/dL    Comment: Performed at [facility] [address], [place], [place] [address]    ECG   N/A  Telemetry   Afib with wide QRS, rate-controlled - Personally Reviewed  Radiology    Ct Angio Chest Pe W Or Wo Contrast  Result Date: 04/10/2019 CLINICAL DATA:  85 year old female with shortness of breath and chest pain. Generalized weakness. Concern for pulmonary embolism. EXAM: CT ANGIOGRAPHY CHEST CT ABDOMEN AND PELVIS WITH CONTRAST TECHNIQUE: Multidetector CT imaging of the chest was performed using the standard protocol during bolus administration of intravenous contrast. Multiplanar CT image reconstructions and MIPs were obtained to evaluate the vascular anatomy. Multidetector CT imaging of the abdomen and pelvis was performed using the standard protocol during bolus administration of intravenous contrast. CONTRAST:  19mL OMNIPAQUE IOHEXOL 350 MG/ML SOLN COMPARISON:  CT of the abdomen pelvis dated 11/07/2013 and chest radiograph dated 04/09/2019. FINDINGS: CTA CHEST FINDINGS Cardiovascular: There is moderate cardiomegaly. No  pericardial effusion. There is retrograde flow of contrast from the right atrium into the IVC consistent with right heart dysfunction. There is moderate atherosclerotic calcification of the thoracic aorta. Evaluation of the aorta is limited due to non opacification and timing of the contrast. There is coronary vascular calcification involving the LAD, RCA, and left circumflex artery. There is no CT evidence of pulmonary embolism. Mediastinum/Nodes: No hilar or mediastinal adenopathy. Esophagus is grossly unremarkable. No mediastinal fluid collection. Lungs/Pleura: There is diffuse interstitial and interlobular septal prominence consistent with edema. Confluent hazy airspace density in the region of the right hilum  in the right lower lobe may represent edema or developing infiltrate. Clinical correlation and follow-up recommended. There is a small right pleural effusion. No pneumothorax. There is thickening of the bronchial wall which may represent bronchitis. Musculoskeletal: Degenerative changes of the spine. No acute osseous pathology. Surgical clips noted in the region of the axilla bilaterally. Bilateral mastectomy. Partially visualized lipoma superficial to the right infraspinatus muscle measuring up to 4.5 cm. Review of the MIP images confirms the above findings. CT ABDOMEN and PELVIS FINDINGS There is no intra-abdominal free air or free fluid. Hepatobiliary: Apparent fatty infiltration of the liver. No intrahepatic biliary ductal dilatation. No calcified gallstone or pericholecystic fluid. Pancreas: Unremarkable. No pancreatic ductal dilatation or surrounding inflammatory changes. Spleen: Normal in size without focal abnormality. Adrenals/Urinary Tract: Bilateral adrenal nodules, measuring up to 2.1 cm on the left, similar to prior CT. This nodules are not characterized but relative stability over the course of 5 years indicate a benign etiology, likely adenoma. There is no hydronephrosis on either side. There is symmetric enhancement and excretion of contrast by both kidneys. The visualized ureters and urinary bladder appear unremarkable. Stomach/Bowel: There is colonic diverticulosis without active inflammatory changes. There is no bowel obstruction. The appendix is not visualized with certainty. No inflammatory changes identified in the right lower quadrant. Vascular/Lymphatic: Advanced aortoiliac atherosclerotic disease. No portal venous gas. There is no adenopathy. Reproductive: Hysterectomy. The ovaries are grossly unremarkable. No adnexal masses. Other: Midline vertical anterior abdominal wall incisional scar. Musculoskeletal: Degenerative changes of the spine. No acute osseous pathology. Review of the MIP  images confirms the above findings. IMPRESSION: 1. No CT evidence of pulmonary embolism or aortic dissection. 2. Cardiomegaly with evidence of right heart dysfunction. 3. Small right pleural effusion with findings of CHF. Confluent airspace density in the right lower lobe may represent edema or developing infiltrate. Clinical correlation and follow-up recommended. 4. Colonic diverticulosis. No bowel obstruction or active inflammation. 5. Aortic Atherosclerosis (ICD10-I70.0). Electronically Signed   By: Anner Crete M.D.   On: 04/10/2019 02:38   Ct Abdomen Pelvis W Contrast  Result Date: 04/10/2019 CLINICAL DATA:  85 year old female with shortness of breath and chest pain. Generalized weakness. Concern for pulmonary embolism. EXAM: CT ANGIOGRAPHY CHEST CT ABDOMEN AND PELVIS WITH CONTRAST TECHNIQUE: Multidetector CT imaging of the chest was performed using the standard protocol during bolus administration of intravenous contrast. Multiplanar CT image reconstructions and MIPs were obtained to evaluate the vascular anatomy. Multidetector CT imaging of the abdomen and pelvis was performed using the standard protocol during bolus administration of intravenous contrast. CONTRAST:  16mL OMNIPAQUE IOHEXOL 350 MG/ML SOLN COMPARISON:  CT of the abdomen pelvis dated 11/07/2013 and chest radiograph dated 04/09/2019. FINDINGS: CTA CHEST FINDINGS Cardiovascular: There is moderate cardiomegaly. No pericardial effusion. There is retrograde flow of contrast from the right atrium into the IVC consistent with right heart dysfunction. There is moderate atherosclerotic calcification of  the thoracic aorta. Evaluation of the aorta is limited due to non opacification and timing of the contrast. There is coronary vascular calcification involving the LAD, RCA, and left circumflex artery. There is no CT evidence of pulmonary embolism. Mediastinum/Nodes: No hilar or mediastinal adenopathy. Esophagus is grossly unremarkable. No  mediastinal fluid collection. Lungs/Pleura: There is diffuse interstitial and interlobular septal prominence consistent with edema. Confluent hazy airspace density in the region of the right hilum in the right lower lobe may represent edema or developing infiltrate. Clinical correlation and follow-up recommended. There is a small right pleural effusion. No pneumothorax. There is thickening of the bronchial wall which may represent bronchitis. Musculoskeletal: Degenerative changes of the spine. No acute osseous pathology. Surgical clips noted in the region of the axilla bilaterally. Bilateral mastectomy. Partially visualized lipoma superficial to the right infraspinatus muscle measuring up to 4.5 cm. Review of the MIP images confirms the above findings. CT ABDOMEN and PELVIS FINDINGS There is no intra-abdominal free air or free fluid. Hepatobiliary: Apparent fatty infiltration of the liver. No intrahepatic biliary ductal dilatation. No calcified gallstone or pericholecystic fluid. Pancreas: Unremarkable. No pancreatic ductal dilatation or surrounding inflammatory changes. Spleen: Normal in size without focal abnormality. Adrenals/Urinary Tract: Bilateral adrenal nodules, measuring up to 2.1 cm on the left, similar to prior CT. This nodules are not characterized but relative stability over the course of 5 years indicate a benign etiology, likely adenoma. There is no hydronephrosis on either side. There is symmetric enhancement and excretion of contrast by both kidneys. The visualized ureters and urinary bladder appear unremarkable. Stomach/Bowel: There is colonic diverticulosis without active inflammatory changes. There is no bowel obstruction. The appendix is not visualized with certainty. No inflammatory changes identified in the right lower quadrant. Vascular/Lymphatic: Advanced aortoiliac atherosclerotic disease. No portal venous gas. There is no adenopathy. Reproductive: Hysterectomy. The ovaries are grossly  unremarkable. No adnexal masses. Other: Midline vertical anterior abdominal wall incisional scar. Musculoskeletal: Degenerative changes of the spine. No acute osseous pathology. Review of the MIP images confirms the above findings. IMPRESSION: 1. No CT evidence of pulmonary embolism or aortic dissection. 2. Cardiomegaly with evidence of right heart dysfunction. 3. Small right pleural effusion with findings of CHF. Confluent airspace density in the right lower lobe may represent edema or developing infiltrate. Clinical correlation and follow-up recommended. 4. Colonic diverticulosis. No bowel obstruction or active inflammation. 5. Aortic Atherosclerosis (ICD10-I70.0). Electronically Signed   By: Anner Crete M.D.   On: 04/10/2019 02:38   Dg Chest Portable 1 View  Result Date: 04/09/2019 CLINICAL DATA:  Generalized weakness.  Shortness of breath. EXAM: PORTABLE CHEST 1 VIEW COMPARISON:  May 23, 2014 FINDINGS: Stable cardiomegaly. The hila and mediastinum are normal. No pneumothorax. No pulmonary nodules or masses. No focal infiltrates. No overt edema. Mild pulmonary venous congestion not excluded. IMPRESSION: Cardiomegaly. Possible mild pulmonary venous congestion. No other abnormalities. Electronically Signed   By: Dorise Bullion III M.D   On: 04/09/2019 18:25   Vas [place] Lower Extremity Venous (dvt)  Result Date: 04/10/2019  Lower Venous Study Indications: Elevated D-Dimer.  Comparison Study: No prior study available for comparison. Performing Technologist: Sharion Dove RVS  Examination Guidelines: A complete evaluation includes B-mode imaging, spectral Doppler, color Doppler, and power Doppler as needed of all accessible portions of each vessel. Bilateral testing is considered an integral part of a complete examination. Limited examinations for reoccurring indications may be performed as noted.  +---------+---------------+---------+-----------+----------+-------+  RIGHT      Compressibility Phasicity Spontaneity Properties  Summary  +---------+---------------+---------+-----------+----------+-------+  CFV       Full            Yes       Yes                             +---------+---------------+---------+-----------+----------+-------+  SFJ       Full                                                      +---------+---------------+---------+-----------+----------+-------+  FV Prox   Full                                                      +---------+---------------+---------+-----------+----------+-------+  FV Mid    Full                                                      +---------+---------------+---------+-----------+----------+-------+  FV Distal Full                                                      +---------+---------------+---------+-----------+----------+-------+  PFV       Full                                                      +---------+---------------+---------+-----------+----------+-------+  POP       Full            Yes       Yes                             +---------+---------------+---------+-----------+----------+-------+  PTV       Full                                                      +---------+---------------+---------+-----------+----------+-------+  PERO      Full                                                      +---------+---------------+---------+-----------+----------+-------+   +---------+---------------+---------+-----------+----------+-------+  LEFT      Compressibility Phasicity Spontaneity Properties Summary  +---------+---------------+---------+-----------+----------+-------+  CFV       Full            Yes       Yes                             +---------+---------------+---------+-----------+----------+-------+  SFJ       Full                                                      +---------+---------------+---------+-----------+----------+-------+  FV Prox   Full                                                       +---------+---------------+---------+-----------+----------+-------+  FV Mid    Full                                                      +---------+---------------+---------+-----------+----------+-------+  FV Distal Full                                                      +---------+---------------+---------+-----------+----------+-------+  PFV       Full                                                      +---------+---------------+---------+-----------+----------+-------+  POP       Full            Yes       Yes                             +---------+---------------+---------+-----------+----------+-------+  PTV       Full                                                      +---------+---------------+---------+-----------+----------+-------+  PERO      Full                                                      +---------+---------------+---------+-----------+----------+-------+     Summary: Right: There is no evidence of deep vein thrombosis in the lower extremity. Left: There is no evidence of deep vein thrombosis in the lower extremity.  *See table(s) above for measurements and observations. Electronically signed by Harold Barban MD on 04/10/2019 at 12:36:25 PM.    Final     Cardiac Studies   N/a  Assessment   1. Principal Problem: 2.   Acute on chronic systolic CHF (congestive heart failure) ([place]) 3. Active Problems: 4.   Hyperlipidemia with target LDL less than 100 5.   Essential hypertension 6.   GERD 7.   Permanent atrial fibrillation:  CHA2DS2-VASc Score (  Eliquis) 8.   Edema of both lower extremities 9.   Chest pain 10.   Elevated troponin 11.   CKD (chronic kidney disease), stage III ([place]) 12.   Unintentional weight loss 13.   Plan   1. Good diuresis overnight - would continue with IV lasix today. I don't think there is a need for additional imaging to determine if there is true thrombus or mass in the LA at this time. Would continue anticoagulation and consider repeat imaging  for the mass in 1-3 months and to also determine if LV function has improved. BP is low normal - she is on losartan 100 mg daily. Will switch to Entresto 49/51 mg BID tomorrow.  Time Spent Directly with Patient:  I have spent a total of 35 minutes with the patient reviewing hospital notes, telemetry, EKGs, labs and examining the patient as well as establishing an assessment and plan that was discussed personally with the patient.  > 50% of time was spent in direct patient care.  Length of Stay:  LOS: 1 day   Pixie Casino, MD, [facility], [place] Director of the [facility] Lipid Disorders &  Cardiovascular [facility] Diplomate of the American Board of Clinical Lipidology Attending Cardiologist  Direct Dial: [phone]   Fax: [phone]  Website:  [email or web address] [email or web address] Lemon Sternberg 04/11/2019, 9:11 AM

## 2019-04-11 NOTE — Plan of Care (Signed)

## 2019-04-12 DIAGNOSIS — R600 Localized edema: Secondary | ICD-10-CM

## 2019-04-12 DIAGNOSIS — N183 Chronic kidney disease, stage 3 (moderate): Secondary | ICD-10-CM

## 2019-04-12 LAB — BASIC METABOLIC PANEL
Anion gap: 12 (ref 5–15)
BUN: 18 mg/dL (ref 8–23)
CO2: 31 mmol/L (ref 22–32)
Calcium: 9.1 mg/dL (ref 8.9–10.3)
Chloride: 95 mmol/L — ABNORMAL LOW (ref 98–111)
Creatinine, Ser: 1.2 mg/dL — ABNORMAL HIGH (ref 0.44–1.00)
GFR calc Af Amer: 48 mL/min — ABNORMAL LOW (ref >60)
GFR calc non Af Amer: 41 mL/min — ABNORMAL LOW (ref >60)
Glucose, Bld: 86 mg/dL (ref 70–99)
Potassium: 3.5 mmol/L (ref 3.5–5.1)
Sodium: 138 mmol/L (ref 135–145)

## 2019-04-12 LAB — MAGNESIUM: Magnesium: 2.1 mg/dL (ref 1.7–2.4)

## 2019-04-12 MED ORDER — PRAVASTATIN SODIUM 40 MG PO TABS
40.0000 mg | ORAL_TABLET | Freq: Every day | ORAL | Status: DC
  Administered 2019-04-12 – 2019-04-14 (×3): 40 mg via ORAL
  Filled 2019-04-12 (×3): qty 1

## 2019-04-12 MED ORDER — POTASSIUM CHLORIDE CRYS ER 20 MEQ PO TBCR: 40.0000 meq | EXTENDED_RELEASE_TABLET | Freq: Every day | ORAL | Status: DC

## 2019-04-12 MED ORDER — POTASSIUM CHLORIDE CRYS ER 20 MEQ PO TBCR
40.0000 meq | EXTENDED_RELEASE_TABLET | Freq: Once | ORAL | Status: AC
  Administered 2019-04-12: 40 meq via ORAL
  Filled 2019-04-12: qty 2

## 2019-04-12 NOTE — Evaluation (Signed)
Occupational Therapy Evaluation Patient Details Name: [name] [name] MRN: [id number] DOB: 03-May-1933 Today's Date: 04/12/2019    History of Present Illness 85 yo admitted with acute on chronic CHF with demand ischemia. PMhx: CHF, CKD, Afib, HTN, HLD   Clinical Impression   Pt lives alone independently and walks with a cane. Pt presents with generalized weakness, decreased activity tolerance and impaired standing balance. Pt requires up to supervision for ADL. Likely to progress to not needing any post acute OT. Will follow acutely.    Follow Up Recommendations  No OT follow up    Equipment Recommendations  Other (comment)(to be determined)    Recommendations for Other Services       Precautions / Restrictions Precautions Precautions: Fall Restrictions Weight Bearing Restrictions: No      Mobility Bed Mobility Overal bed mobility: Modified Independent             General bed mobility comments: HOB flat  Transfers Overall transfer level: Modified independent Equipment used: Straight cane                  Balance Overall balance assessment: Needs assistance   Sitting balance-Leahy Scale: Good       Standing balance-Leahy Scale: Fair Standing balance comment: pt able to static stand without support but reliant on cane for gait                           ADL either performed or assessed with clinical judgement   ADL Overall ADL's : Needs assistance/impaired Eating/Feeding: Independent;Sitting   Grooming: Wash/dry hands;Standing;Supervision/safety   Upper Body Bathing: Set up;Sitting   Lower Body Bathing: Sit to/from stand;Set up   Upper Body Dressing : Set up;Sitting   Lower Body Dressing: Sit to/from stand;Set up   Toilet Transfer: Modified Independent;Stand-pivot;BSC   Toileting- Clothing Manipulation and Hygiene: Modified independent;Sit to/from stand       Functional mobility during ADLs: Supervision/safety;Cane        Vision Patient Visual Report: No change from baseline       Perception     Praxis      Pertinent Vitals/Pain Pain Assessment: No/denies pain     Hand Dominance Right   Extremity/Trunk Assessment Upper Extremity Assessment Upper Extremity Assessment: Overall WFL for tasks assessed   Lower Extremity Assessment Lower Extremity Assessment: Defer to PT evaluation   Cervical / Trunk Assessment Cervical / Trunk Assessment: Normal   Communication Communication Communication: No difficulties   Cognition Arousal/Alertness: Awake/alert Behavior During Therapy: WFL for tasks assessed/performed Overall Cognitive Status: Within Functional Limits for tasks assessed                                     General Comments       Exercises     Shoulder Instructions      Home Living Family/patient expects to be discharged to:: Private residence Living Arrangements: Alone Available Help at Discharge: Family;Available PRN/intermittently Type of Home: House Home Access: Level entry     Home Layout: One level     Bathroom Shower/Tub: Teacher, early years/pre: Standard     Home Equipment: Cane - single point;Bedside commode          Prior Functioning/Environment Level of Independence: Independent with assistive device(s)        Comments: walks with cane at all times  OT Problem List: Impaired balance (sitting and/or standing);Decreased activity tolerance;Decreased knowledge of use of DME or AE      OT Treatment/Interventions: Self-care/ADL training;DME and/or AE instruction;Energy conservation    OT Goals(Current goals can be found in the care plan section) Acute Rehab OT Goals Patient Stated Goal: to warm up OT Goal Formulation: With patient Time For Goal Achievement: 04/26/19 Potential to Achieve Goals: Good ADL Goals Pt Will Perform Tub/Shower Transfer: Tub transfer;with modified independence;ambulating;shower  seat Additional ADL Goal #1: Pt will state at least 3 energy conservation strategies as instructed.  OT Frequency: Min 2X/week   Barriers to D/C:            Co-evaluation              AM-PAC OT "6 Clicks" Daily Activity     Outcome Measure Help from another person eating meals?: None Help from another person taking care of personal grooming?: A Little Help from another person toileting, which includes using toliet, bedpan, or urinal?: None Help from another person bathing (including washing, rinsing, drying)?: None Help from another person to put on and taking off regular upper body clothing?: None Help from another person to put on and taking off regular lower body clothing?: None 6 Click Score: 23   End of Session Equipment Utilized During Treatment: Gait belt Nurse Communication: Other (comment)(pt had BM)  Activity Tolerance: Patient tolerated treatment well Patient left: in bed;with call bell/phone within reach  OT Visit Diagnosis: Other abnormalities of gait and mobility (R26.89)                Time: 9163-8466 OT Time Calculation (min): 21 min Charges:  OT General Charges $OT Visit: 1 Visit OT Evaluation $OT Eval Moderate Complexity: 1 Mod  Nestor Lewandowsky, OTR/L Acute Rehabilitation Services Pager: [phone] Office: [phone]  [name] [name] 04/12/2019, 1:43 PM

## 2019-04-12 NOTE — Evaluation (Signed)
Physical Therapy Evaluation Patient Details Name: [name] [name] MRN: [id number] DOB: 28-Jul-1933 Today's Date: 04/12/2019   History of Present Illness  85 yo admitted with acute on chronic CHF with demand ischemia. PMhx: CHF, CKD, Afib, HTN, HLD  Clinical Impression  Pt very pleasant standing with cane on arrival and able to walk in hall. Pt with very slow gait grossly 0.36ft/sec making pt a high fall risk with pt reporting fear of falling as her reasoning for walking so slowly. Pt will benefit from acute therapy to access benefit of RW vs use of gait belt and reassurance to increase gait speed and distance to decrease fall risk. Pt reports sister plans to move in with her and that she is generally able to care for herself without difficulty without history of recent fall. Pt with decreased activity tolerance and gait who will benefit from acute therapy to maximize independence and safety.   Hr 71 during gait with SpO2 100% on RA     Follow Up Recommendations No PT follow up    Equipment Recommendations  Rolling walker with 5" wheels    Recommendations for Other Services       Precautions / Restrictions Precautions Precautions: Fall      Mobility  Bed Mobility               General bed mobility comments: standing on arrival  Transfers Overall transfer level: Modified independent                  Ambulation/Gait Ambulation/Gait assistance: Supervision Gait Distance (Feet): 180 Feet Assistive device: Straight cane Gait Pattern/deviations: Step-through pattern;Decreased stride length   Gait velocity interpretation: <1.8 ft/sec, indicate of risk for recurrent falls General Gait Details: pt with very slow cautious gait grossly 6 min to complete 180' with pt reporting fear of falling as reason for delayed speed and unable to accelerate. Limited by fatigue  Stairs            Wheelchair Mobility    Modified Rankin (Stroke Patients Only)       Balance  Overall balance assessment: Needs assistance   Sitting balance-Leahy Scale: Good       Standing balance-Leahy Scale: Fair Standing balance comment: pt able to static stand without support but reliant on cane for gait                             Pertinent Vitals/Pain Pain Assessment: No/denies pain    Home Living Family/patient expects to be discharged to:: Private residence Living Arrangements: Alone Available Help at Discharge: Family;Available PRN/intermittently Type of Home: House Home Access: Level entry     Home Layout: One level Home Equipment: Cane - single point;Bedside commode      Prior Function Level of Independence: Independent with assistive device(s)         Comments: walks with cane at all times     Hand Dominance        Extremity/Trunk Assessment   Upper Extremity Assessment Upper Extremity Assessment: Generalized weakness    Lower Extremity Assessment Lower Extremity Assessment: Generalized weakness    Cervical / Trunk Assessment Cervical / Trunk Assessment: Normal  Communication   Communication: No difficulties  Cognition Arousal/Alertness: Awake/alert Behavior During Therapy: WFL for tasks assessed/performed Overall Cognitive Status: Within Functional Limits for tasks assessed  General Comments      Exercises     Assessment/Plan    PT Assessment Patient needs continued PT services  PT Problem List Decreased mobility;Decreased activity tolerance;Decreased balance;Decreased knowledge of use of DME       PT Treatment Interventions Gait training;Therapeutic exercise;Patient/family education;Balance training;Functional mobility training;DME instruction;Therapeutic activities    PT Goals (Current goals can be found in the Care Plan section)  Acute Rehab PT Goals Patient Stated Goal: return home and have my sister move in PT Goal Formulation: With patient Time For  Goal Achievement: 04/26/19 Potential to Achieve Goals: Good    Frequency Min 3X/week   Barriers to discharge Decreased caregiver support      Co-evaluation               AM-PAC PT "6 Clicks" Mobility  Outcome Measure Help needed turning from your back to your side while in a flat bed without using bedrails?: None Help needed moving from lying on your back to sitting on the side of a flat bed without using bedrails?: None Help needed moving to and from a bed to a chair (including a wheelchair)?: None Help needed standing up from a chair using your arms (e.g., wheelchair or bedside chair)?: None Help needed to walk in hospital room?: A Little Help needed climbing 3-5 steps with a railing? : A Little 6 Click Score: 22    End of Session   Activity Tolerance: Patient tolerated treatment well Patient left: in chair;with call bell/phone within reach Nurse Communication: Mobility status PT Visit Diagnosis: Other abnormalities of gait and mobility (R26.89);Muscle weakness (generalized) (M62.81)    Time: 6503-5465 PT Time Calculation (min) (ACUTE ONLY): 13 min   Charges:   PT Evaluation $PT Eval Moderate Complexity: 1 Mod          [place], PT Acute Rehabilitation Services Pager: [phone] Office: [phone]   Sandy Salaam Adia Crammer 04/12/2019, 12:28 PM

## 2019-04-12 NOTE — Progress Notes (Addendum)
Patient with BP of 91/58.  Entresto and IV lasix scheduled.  RN paged [facility] to confirm holding medications tonight.    2123: Sch[place], NP confirmed to hold lasix and entresto.

## 2019-04-12 NOTE — Plan of Care (Signed)

## 2019-04-12 NOTE — Progress Notes (Addendum)
Patient called out with onset of shortness of breath.  O2sat 99% on room air.  Elevated head of bed and gave 2L [place] for comfort.  Patient felt slight improvement but still tachypneic at 24/min.  Mild fine crackles heard in LLL. No complaints of CP. RN paged NP for suggestions.     0020: Received order for 20mg  IV lasix.

## 2019-04-12 NOTE — Progress Notes (Signed)
[facility] PROGRESS NOTE  TEPHANIE [name] QIW:[id number] DOB: December 11, 1932 DOA: 04/09/2019 PCP: Cassandria Anger, MD  Assessment/Plan:  1 acute on chronic systolic heart failure Questionable etiology. Patient denies any dietary indiscretions. Patient states she is compliant with her medications. Patient presented with chest pain and shortness of breath. 2D echo done 04/10/19 with a EF of 20-25%, left ventricular hypokinesis, akinesis of basal and mid anteroseptum as well as 2.3x1.4cm mass in posterior LA of unclear significance. Patient noted to be volume overloaded on examination. Chest x-ray consistent with pulmonary edema. Troponins were elevated however flattened. BNP of one 385.5. Patient placed on IV Lasix with clinical improvement however not at baseline. Patient with a urine output of 3L overnight. Current weight of 177.5 pounds from 184.5 pounds on admission. Norvasc discontinued  to allow for better diuresis as blood pressure somewhat borderline. Oxygen saturation level 100% on room air at rest Evaluated by cardiology who recommend continuing IV lasix and repeating imaging for ?mass in 1-3 months as well as continuing anticoagulation -Continue Lasix 40 mg IV every 12 hours -discontinue Cozaar and Entresto started per cardiology.  -continue eliquis -repeat imaging (TEE?) in 1-3 months to evaluate mass vs thrombus in the LA as well as LV function   2. Chest pain Likely demand ischemia secondary to problem #1. Remains pain free this am. Troponins were mildly elevated however flattened. Echo noted above. D-dimer elevated. CT angiogram chest was negative for PE.  -Continue IV Lasix.  -Continue entresto.  -mobilize with PT  3. Chronic kidney disease stage III creatinine 1.20 this am trending up from 1.01. likely related to IV lasix. -continuing with IV lasix as noted above -hold other nephrotoxins as able -monitor urine output -Monitor with diuresis.  4.  Permanent atrial fibrillation CHA2DS2Vascscore is 5 Currently rate controlled. Patient was on metoprolol in the past but currently not on metoprolol. -Continue Eliquis for anticoagulation.  5. Unintentional weight loss Patient noted to have a 70 pound unintentional weight loss. Etiology unclear. Patient with prior history of breast cancer. CT abdomen and pelvis was done as well as CT angiogram chest done which was negative for metastatic disease. Outpatient follow-up with PCP.  6. Gastroesophageal reflux disease eating well PPI.  7. Hyperlipidemia Patient noted to be on pravastatin in the past. Patient on any statin. Fasting lipid panel with LDL 138. Resume statin. Outpatient follow-up.  8. Hypokalemia Likely secondary to diuresis.  -Replete and recheck  9. Hypertension Blood pressure low end of normal. Home norvasc was discontinued to allow room for diuresis. Too low to allow for addition of BB for now -continuing IV Lasix.  -Continue entresto.  -monitor  Code Status: full Family Communication: ralph son on the phone Disposition Plan: home tomorrow hopefully   Consultants:  [facility]  Procedures:  echo  Antibiotics:    HPI/Subjective: Sitting on side of bed eating breakfast. Reports breathing better  Objective: Vitals:   04/12/19 0824 04/12/19 0913  BP: 100/65   Pulse: 62 71  Resp: 20   Temp: 97.6 F (36.4 C)   SpO2: 100% 100%    Intake/Output Summary (Last 24 hours) at 04/12/2019 1044 Last data filed at 04/12/2019 0426 Gross per 24 hour  Intake 940 ml  Output 3350 ml  Net -2410 ml   Filed Weights   04/11/19 0448 04/11/19 0500 04/12/19 0508  Weight: 81 kg 81 kg 80.6 kg    Exam:   General:  Awake alert no acute distress  Cardiovascular: irregularly irregular no MGR  trace LE edema  Respiratory: normal effort BS clear bilaterally no wheeze  Abdomen: soft non-distended non tender +BS  Musculoskeletal: joints  without swelling/erythema   Data Reviewed: Basic Metabolic Panel: Recent Labs  Lab 04/09/19 1738 04/10/19 1119 04/11/19 0620 04/12/19 0413  NA 140 140 141 138  K 3.7 3.3* 3.4* 3.5  CL 102 101 98 95*  CO2 26 29 32 31  GLUCOSE 106* 124* 91 86  BUN 10 11 17 18   CREATININE 1.01* 0.93 1.28* 1.20*  CALCIUM 9.9 9.2 9.2 9.1  MG  --   --  1.6* 2.1   Liver Function Tests: Recent Labs  Lab 04/09/19 1738  AST 33  ALT 28  ALKPHOS 61  BILITOT 0.6  PROT 7.0  ALBUMIN 3.7   No results for input(s): LIPASE, AMYLASE in the last 168 hours. No results for input(s): AMMONIA in the last 168 hours. CBC: Recent Labs  Lab 04/09/19 1738 04/11/19 0620  WBC 5.4 4.7  HGB 12.8 11.9*  HCT 39.2 36.7  MCV 86.0 85.3  PLT 211 185   Cardiac Enzymes: No results for input(s): CKTOTAL, CKMB, CKMBINDEX, TROPONINI in the last 168 hours. BNP (last 3 results) Recent Labs    04/09/19 2201  BNP 1,385.5*    ProBNP (last 3 results) No results for input(s): PROBNP in the last 8760 hours.  CBG: No results for input(s): GLUCAP in the last 168 hours.  Recent Results (from the past 240 hour(s))  SARS Coronavirus 2 [facility] order, Performed in [facility] hospital lab) Nasopharyngeal Nasopharyngeal Swab     Status: None   Collection Time: 04/09/19  9:35 PM   Specimen: Nasopharyngeal Swab  Result Value Ref Range Status   SARS Coronavirus 2 NEGATIVE NEGATIVE Final    Comment: (NOTE) If result is NEGATIVE SARS-CoV-2 target nucleic acids are NOT DETECTED. The SARS-CoV-2 RNA is generally detectable in upper and lower  respiratory specimens during the acute phase of infection. The lowest  concentration of SARS-CoV-2 viral copies this assay can detect is 250  copies / mL. A negative result does not preclude SARS-CoV-2 infection  and should not be used as the sole basis for treatment or other  patient management decisions.  A negative result may occur with  improper specimen collection / handling,  submission of specimen other  than nasopharyngeal swab, presence of viral mutation(s) within the  areas targeted by this assay, and inadequate number of viral copies  (<250 copies / mL). A negative result must be combined with clinical  observations, patient history, and epidemiological information. If result is POSITIVE SARS-CoV-2 target nucleic acids are DETECTED. The SARS-CoV-2 RNA is generally detectable in upper and lower  respiratory specimens dur ing the acute phase of infection.  Positive  results are indicative of active infection with SARS-CoV-2.  Clinical  correlation with patient history and other diagnostic information is  necessary to determine patient infection status.  Positive results do  not rule out bacterial infection or co-infection with other viruses. If result is PRESUMPTIVE POSTIVE SARS-CoV-2 nucleic acids MAY BE PRESENT.   A presumptive positive result was obtained on the submitted specimen  and confirmed on repeat testing.  While 2019 novel coronavirus  (SARS-CoV-2) nucleic acids may be present in the submitted sample  additional confirmatory testing may be necessary for epidemiological  and / or clinical management purposes  to differentiate between  SARS-CoV-2 and other Sarbecovirus currently known to infect humans.  If clinically indicated additional testing with an alternate test  methodology (  [id number]) is advised. The SARS-CoV-2 RNA is generally  detectable in upper and lower respiratory sp ecimens during the acute  phase of infection. The expected result is Negative. Fact Sheet for Patients:  StrictlyIdeas.no Fact Sheet for Healthcare Providers: BankingDealers.co.za This test is not yet approved or cleared by the [place] FDA and has been authorized for detection and/or diagnosis of SARS-CoV-2 by FDA under an Emergency Use Authorization (EUA).  This EUA will remain in effect (meaning this test can be  used) for the duration of the COVID-19 declaration under Section 564(b)(1) of the Act, 21 U.S.C. section 360bbb-3(b)(1), unless the authorization is terminated or revoked sooner. Performed at [facility], [place] [address] [place], [place] [address]      Studies: No results found.  Scheduled Meds: . apixaban  5 mg Oral BID  . diphenhydrAMINE  25 mg Oral QHS  . furosemide  40 mg Intravenous Q12H  . multivitamin with minerals  1 tablet Oral Daily  . pantoprazole  40 mg Oral BID  . pravastatin  40 mg Oral q1800  . sacubitril-valsartan  1 tablet Oral BID  . sodium chloride flush  3 mL Intravenous Once   Continuous Infusions:  Principal Problem:   Acute on chronic systolic CHF (congestive heart failure) ([facility]) Active Problems:   Hyperlipidemia with target LDL less than 100   Essential hypertension   GERD   Permanent atrial fibrillation:  CHA2DS2-VASc Score (Eliquis)   Shortness of breath   Edema of both lower extremities   Chest pain   Elevated troponin   CKD (chronic kidney disease), stage III ([place])   Unintentional weight loss   Abnormal echocardiogram    Time spent: 45 minutes    [place] NP  [facility]  If 7PM-7AM, please contact night-coverage at [email or web address], password [facility] 04/12/2019, 10:44 AM  LOS: 2 days

## 2019-04-12 NOTE — Progress Notes (Signed)
Progress Note  Patient Name: [name] [name] Date of Encounter: 04/12/2019  Primary Cardiologist: Glenetta Hew, MD   Subjective   No issues overnight - plan to transition to [facility] today- BP soft. Diuresed another 2.1L negative yesterday. Creatinine stable. LDL 138 - goal <70.  Inpatient Medications    Scheduled Meds: . apixaban  5 mg Oral BID  . diphenhydrAMINE  25 mg Oral QHS  . furosemide  40 mg Intravenous Q12H  . multivitamin with minerals  1 tablet Oral Daily  . pantoprazole  40 mg Oral BID  . pravastatin  20 mg Oral q1800  . sacubitril-valsartan  1 tablet Oral BID  . sodium chloride flush  3 mL Intravenous Once   Continuous Infusions:  PRN Meds: acetaminophen, dextromethorphan-guaiFENesin, hydrALAZINE, levalbuterol, magnesium hydroxide, morphine injection, nitroGLYCERIN, ondansetron (ZOFRAN) IV, zolpidem   Vital Signs    Vitals:   04/11/19 1208 04/11/19 1954 04/12/19 0508 04/12/19 0824  BP: (!) 122/56 96/67 95/60  100/65  Pulse: 82 (!) 58 80 62  Resp: 18 16 15 20   Temp: 98.1 F (36.7 C) 98.4 F (36.9 C) 98.6 F (37 C) 97.6 F (36.4 C)  TempSrc: Oral Oral Oral Oral  SpO2: 92%  95% 100%  Weight:   80.6 kg   Height:        Intake/Output Summary (Last 24 hours) at 04/12/2019 0845 Last data filed at 04/12/2019 0426 Gross per 24 hour  Intake 940 ml  Output 3350 ml  Net -2410 ml   Filed Weights   04/11/19 0448 04/11/19 0500 04/12/19 0508  Weight: 81 kg 81 kg 80.6 kg    Telemetry    Sinus rhythm - Personally Reviewed  ECG    N/A  Physical Exam   GEN: No acute distress.   Neck: No JVD, no carotid bruits Cardiac: RRR, no murmurs, rubs, or gallops.  Respiratory: Clear to auscultation bilaterally, no wheezes/ rales/ rhonchi GI: NABS, Soft, nontender, non-distended  MS: trace edema Neuro:  Nonfocal, moving all extremities spontaneously Psych: Normal affect   Labs    Chemistry Recent Labs  Lab 04/09/19 1738 04/10/19 1119 04/11/19 0620  04/12/19 0413  NA 140 140 141 138  K 3.7 3.3* 3.4* 3.5  CL 102 101 98 95*  CO2 26 29 32 31  GLUCOSE 106* 124* 91 86  BUN 10 11 17 18   CREATININE 1.01* 0.93 1.28* 1.20*  CALCIUM 9.9 9.2 9.2 9.1  PROT 7.0  --   --   --   ALBUMIN 3.7  --   --   --   AST 33  --   --   --   ALT 28  --   --   --   ALKPHOS 61  --   --   --   BILITOT 0.6  --   --   --   GFRNONAA 51* 56* 38* 41*  GFRAA 59* >60 44* 48*  ANIONGAP 12 10 11 12      Hematology Recent Labs  Lab 04/09/19 1738 04/11/19 0620  WBC 5.4 4.7  RBC 4.56 4.30  HGB 12.8 11.9*  HCT 39.2 36.7  MCV 86.0 85.3  MCH 28.1 27.7  MCHC 32.7 32.4  RDW 15.8* 15.7*  PLT 211 185    Cardiac EnzymesNo results for input(s): TROPONINI in the last 168 hours. No results for input(s): TROPIPOC in the last 168 hours.   BNP Recent Labs  Lab 04/09/19 2201  BNP 1,385.5*     DDimer  Recent Labs  Lab 04/09/19 2224  DDIMER 1.29*     Radiology    Vas [place] Lower Extremity Venous (dvt)  Result Date: 04/10/2019  Lower Venous Study Indications: Elevated D-Dimer.  Comparison Study: No prior study available for comparison. Performing Technologist: Sharion Dove RVS  Examination Guidelines: A complete evaluation includes B-mode imaging, spectral Doppler, color Doppler, and power Doppler as needed of all accessible portions of each vessel. Bilateral testing is considered an integral part of a complete examination. Limited examinations for reoccurring indications may be performed as noted.  +---------+---------------+---------+-----------+----------+-------+ RIGHT    CompressibilityPhasicitySpontaneityPropertiesSummary +---------+---------------+---------+-----------+----------+-------+ CFV      Full           Yes      Yes                          +---------+---------------+---------+-----------+----------+-------+ SFJ      Full                                                  +---------+---------------+---------+-----------+----------+-------+ FV Prox  Full                                                 +---------+---------------+---------+-----------+----------+-------+ FV Mid   Full                                                 +---------+---------------+---------+-----------+----------+-------+ FV DistalFull                                                 +---------+---------------+---------+-----------+----------+-------+ PFV      Full                                                 +---------+---------------+---------+-----------+----------+-------+ POP      Full           Yes      Yes                          +---------+---------------+---------+-----------+----------+-------+ PTV      Full                                                 +---------+---------------+---------+-----------+----------+-------+ PERO     Full                                                 +---------+---------------+---------+-----------+----------+-------+   +---------+---------------+---------+-----------+----------+-------+ LEFT     CompressibilityPhasicitySpontaneityPropertiesSummary +---------+---------------+---------+-----------+----------+-------+ CFV      Full  Yes      Yes                          +---------+---------------+---------+-----------+----------+-------+ SFJ      Full                                                 +---------+---------------+---------+-----------+----------+-------+ FV Prox  Full                                                 +---------+---------------+---------+-----------+----------+-------+ FV Mid   Full                                                 +---------+---------------+---------+-----------+----------+-------+ FV DistalFull                                                 +---------+---------------+---------+-----------+----------+-------+ PFV      Full                                                  +---------+---------------+---------+-----------+----------+-------+ POP      Full           Yes      Yes                          +---------+---------------+---------+-----------+----------+-------+ PTV      Full                                                 +---------+---------------+---------+-----------+----------+-------+ PERO     Full                                                 +---------+---------------+---------+-----------+----------+-------+     Summary: Right: There is no evidence of deep vein thrombosis in the lower extremity. Left: There is no evidence of deep vein thrombosis in the lower extremity.  *See table(s) above for measurements and observations. Electronically signed by Harold Barban MD on 04/10/2019 at 12:36:25 PM.    Final     Cardiac Studies   Echocardiogram 04/10/2019:  IMPRESSIONS    1. The left ventricle has severely reduced systolic function, with an ejection fraction of 20-25%. The cavity size was mildly dilated. Left ventricular diastolic function could not be evaluated secondary to atrial fibrillation. Elevated left atrial and  left ventricular end-diastolic pressures Left ventricular diffuse hypokinesis.  2. There is akinesis of the basal and mid infero and anteroseptum, apical septum, apical anterior and apical inferolaterl  akinesis.  3. The right ventricle has moderately reduced systolic function. The cavity was severely enlarged. There is no increase in right ventricular wall thickness. Right ventricular systolic pressure is moderately elevated at 65mmhg.  4. Left atrial size was severely dilated.  5. There is a 2.3 x 1.43cm mass in the posterior LA of uncler significance. Cannot rule out thrombus. Consider TEE if clinically indicated.  6. Right atrial size was severely dilated.  7. Mild thickening of the mitral valve leaflet. Mitral valve regurgitation is mild to moderate by color flow  Doppler.  8. The aortic valve is tricuspid. Moderate sclerosis of the aortic valve. Aortic valve regurgitation was not assessed by color flow Doppler. Moderate aortic annular calcification noted.  9. Tricuspid valve regurgitation is moderate. 10. The aorta is normal in size and structure. 11. The inferior vena cava was dilated in size with <50% respiratory variability  Patient Profile     85 yo female with acute on chronic systolic CHF, non-obstructive mild CAD on coronary CTA 09/2018, permanent afib and CKD 3 with newly reduced LVEF to 25% and possible LA mass on echo for which cardiology is following.  Assessment & Plan    1. Acute on chronic combined CHF: patient presented with chest pain and SOB. Trop mildly elevated to 26 x2 (suspect demand ischemia). EKG non-ischemic. BNP elevated to 1300. Echo with EF 20-25% (previously 45-50% 12/2017) She was started on IV lasix with UOP net -2.1L in the past 24 hours and -3.9L this admission. Weight 184lbs on admission > 177lbs today. Transitioned from losartan to entresto (starting today). Cr stable at 1.2 - Continue entresto  - Continue IV lasix - Soft BP limits addition of BBlocker though could consider adding low dose metoprolol in the future if BP allows.  - Plan for repeat echo in 3 months to monitor for improvement in LV function   2. LA mass: suspected thrombus vs mass vs artifact on echo 04/10/2019 - Continue apixaban 5mg  BID  - Plan to repeat echo in 1-3 months to evaluate for resolution. If mass persists at that time, may need additional imaging studies to further characterize findings.   3. Permanent atrial fibrillation: rates well controlled this admission; not on any AV nodal blocking agents - Continue apixaban for stroke ppx  4. Non-obstructive CAD: noted on coronary CTA 09/2018. Not on aspirin given need for anticoagulation - Continue statin for risk factor modifications  5. HLD: LDL 138 on labs this admission - Will increase  pravastatin to 40mg  daily   For questions or updates, please contact [place] Please consult [email or web address] for contact info under Cardiology/STEMI.   Pixie Casino, MD, [facility], [place] Director of the [facility] Lipid Disorders &  Cardiovascular [facility] Diplomate of the American Board of Clinical Lipidology Attending Cardiologist  Direct Dial: [phone]  Fax: [phone]  Website:  [email or web address]

## 2019-04-13 ENCOUNTER — Telehealth: Payer: Self-pay | Admitting: Medical

## 2019-04-13 LAB — BASIC METABOLIC PANEL
Anion gap: 10 (ref 5–15)
BUN: 23 mg/dL (ref 8–23)
CO2: 30 mmol/L (ref 22–32)
Calcium: 9 mg/dL (ref 8.9–10.3)
Chloride: 97 mmol/L — ABNORMAL LOW (ref 98–111)
Creatinine, Ser: 1.41 mg/dL — ABNORMAL HIGH (ref 0.44–1.00)
GFR calc Af Amer: 40 mL/min — ABNORMAL LOW (ref >60)
GFR calc non Af Amer: 34 mL/min — ABNORMAL LOW (ref >60)
Glucose, Bld: 100 mg/dL — ABNORMAL HIGH (ref 70–99)
Potassium: 3.7 mmol/L (ref 3.5–5.1)
Sodium: 137 mmol/L (ref 135–145)

## 2019-04-13 MED ORDER — METOPROLOL TARTRATE 12.5 MG HALF TABLET
12.5000 mg | ORAL_TABLET | Freq: Two times a day (BID) | ORAL | Status: DC
  Administered 2019-04-13 – 2019-04-15 (×2): 12.5 mg via ORAL
  Filled 2019-04-13 (×4): qty 1

## 2019-04-13 MED ORDER — FUROSEMIDE 10 MG/ML IJ SOLN
20.0000 mg | Freq: Once | INTRAMUSCULAR | Status: AC
  Administered 2019-04-13: 20 mg via INTRAVENOUS
  Filled 2019-04-13: qty 2

## 2019-04-13 MED ORDER — FUROSEMIDE 40 MG PO TABS
40.0000 mg | ORAL_TABLET | Freq: Every day | ORAL | Status: DC
  Administered 2019-04-14: 40 mg via ORAL
  Filled 2019-04-13: qty 1

## 2019-04-13 NOTE — Telephone Encounter (Signed)
Patient still in hospital.

## 2019-04-13 NOTE — Progress Notes (Signed)
Progress Note  Patient Name: [name] [name] Date of Encounter: 04/13/2019  Primary Cardiologist: Glenetta Hew, MD   Subjective   Reported some SOB overnight for which she was given IV lasix with improvement in symptoms. No complaints of chest pain or palpitations. She denies any racing heart beat sensations.   Inpatient Medications    Scheduled Meds: . apixaban  5 mg Oral BID  . diphenhydrAMINE  25 mg Oral QHS  . furosemide  40 mg Intravenous Q12H  . multivitamin with minerals  1 tablet Oral Daily  . pantoprazole  40 mg Oral BID  . pravastatin  40 mg Oral q1800  . sacubitril-valsartan  1 tablet Oral BID  . sodium chloride flush  3 mL Intravenous Once   Continuous Infusions:  PRN Meds: acetaminophen, dextromethorphan-guaiFENesin, hydrALAZINE, levalbuterol, magnesium hydroxide, morphine injection, nitroGLYCERIN, ondansetron (ZOFRAN) IV, zolpidem   Vital Signs    Vitals:   04/12/19 2333 04/12/19 2335 04/13/19 0348 04/13/19 0739  BP:  104/75 101/65 (!) 133/100  Pulse:  79 62 81  Resp:  (!) 24 18 20   Temp:  98.2 F (36.8 C) 98 F (36.7 C) 98.2 F (36.8 C)  TempSrc:  Oral  Oral  SpO2: 99% 99% 99% 96%  Weight:   79.8 kg   Height:        Intake/Output Summary (Last 24 hours) at 04/13/2019 1004 Last data filed at 04/13/2019 0200 Gross per 24 hour  Intake 342 ml  Output 2400 ml  Net -2058 ml   Filed Weights   04/11/19 0500 04/12/19 0508 04/13/19 0348  Weight: 81 kg 80.6 kg 79.8 kg    Telemetry    Atrial fibrillation with rates up to 140s this morning - Personally Reviewed   Physical Exam   GEN: Sitting upright in bed in no acute distress.   Neck: No JVD, no carotid bruits Cardiac: IRIR, no murmurs, rubs, or gallops.  Respiratory: Clear to auscultation bilaterally, no wheezes/ rales/ rhonchi GI: NABS, Soft, nontender, non-distended  MS: No edema; No deformity. Neuro:  Nonfocal, moving all extremities spontaneously Psych: Normal affect   Labs     Chemistry Recent Labs  Lab 04/09/19 1738  04/11/19 0620 04/12/19 0413 04/13/19 0459  NA 140   < > 141 138 137  K 3.7   < > 3.4* 3.5 3.7  CL 102   < > 98 95* 97*  CO2 26   < > 32 31 30  GLUCOSE 106*   < > 91 86 100*  BUN 10   < > 17 18 23   CREATININE 1.01*   < > 1.28* 1.20* 1.41*  CALCIUM 9.9   < > 9.2 9.1 9.0  PROT 7.0  --   --   --   --   ALBUMIN 3.7  --   --   --   --   AST 33  --   --   --   --   ALT 28  --   --   --   --   ALKPHOS 61  --   --   --   --   BILITOT 0.6  --   --   --   --   GFRNONAA 51*   < > 38* 41* 34*  GFRAA 59*   < > 44* 48* 40*  ANIONGAP 12   < > 11 12 10    < > = values in this interval not displayed.     Hematology Recent Labs  Lab 04/09/19 1738 04/11/19 0620  WBC 5.4 4.7  RBC 4.56 4.30  HGB 12.8 11.9*  HCT 39.2 36.7  MCV 86.0 85.3  MCH 28.1 27.7  MCHC 32.7 32.4  RDW 15.8* 15.7*  PLT 211 185    Cardiac EnzymesNo results for input(s): TROPONINI in the last 168 hours. No results for input(s): TROPIPOC in the last 168 hours.   BNP Recent Labs  Lab 04/09/19 2201  BNP 1,385.5*     DDimer  Recent Labs  Lab 04/09/19 2224  DDIMER 1.29*     Radiology    No results found.  Cardiac Studies   Echocardiogram 04/10/2019:  IMPRESSIONS   1. The left ventricle has severely reduced systolic function, with an ejection fraction of 20-25%. The cavity size was mildly dilated. Left ventricular diastolic function could not be evaluated secondary to atrial fibrillation. Elevated left atrial and  left ventricular end-diastolic pressures Left ventricular diffuse hypokinesis. 2. There is akinesis of the basal and mid infero and anteroseptum, apical septum, apical anterior and apical inferolaterl akinesis. 3. The right ventricle has moderately reduced systolic function. The cavity was severely enlarged. There is no increase in right ventricular wall thickness. Right ventricular systolic pressure is moderately elevated at 36mmhg. 4. Left atrial  size was severely dilated. 5. There is a 2.3 x 1.43cm mass in the posterior LA of uncler significance. Cannot rule out thrombus. Consider TEE if clinically indicated. 6. Right atrial size was severely dilated. 7. Mild thickening of the mitral valve leaflet. Mitral valve regurgitation is mild to moderate by color flow Doppler. 8. The aortic valve is tricuspid. Moderate sclerosis of the aortic valve. Aortic valve regurgitation was not assessed by color flow Doppler. Moderate aortic annular calcification noted. 9. Tricuspid valve regurgitation is moderate. 10. The aorta is normal in size and structure. 11. The inferior vena cava was dilated in size with <50% respiratory variability  Patient Profile     85 yo female with acute on chronic systolic CHF, non-obstructive mild CAD on coronary CTA 09/2018, permanent afib and CKD 3 with newly reduced LVEF to 25% and possible LA mass on echo for which cardiology is following.  Assessment & Plan    1. Acute on chronic combined CHF: patient presented with chest pain and SOB. Trop mildly elevated to 26 x2 (suspect demand ischemia). EKG non-ischemic. BNP elevated to 1300. Echo with EF 20-25% (previously 45-50% 12/2017) She was started on IV lasix with UOP net -2.0L in the past 24 hours and -5.9L this admission. Weight 184lbs on admission > 175lbs today. Transitioned from losartan to entresto. Cr up to 1.4 today (baseline 1.0) - Continue entresto  - Anticipate transition to po lasix today - Will start low does metoprolol today given stable BP's and elevated HR - Plan for repeat echo in 3 months to monitor for improvement in LV function   2. LA mass: suspected thrombus vs mass vs artifact on echo 04/10/2019 - Continue apixaban 5mg  BID  - Plan to repeat echo in 1-3 months to evaluate for resolution. If mass persists at that time, may need additional imaging studies to further characterize findings.   3. Permanent atrial fibrillation: rates elevated this  morning; not on any AV nodal blocking agents - Will start metoprolol 12.5mg  BID today for rate control and HF management.  - Continue apixaban for stroke ppx  4. Non-obstructive CAD: noted on coronary CTA 09/2018. Not on aspirin given need for anticoagulation - Continue statin for risk factor modifications  5. HLD:  LDL 138 on labs this admission; goal <70. Pravastatin increased to 40mg  daily  - Continue pravastatin  For questions or updates, please contact [place] Please consult [email or web address] for contact info under Cardiology/STEMI.      Signed, Abigail Butts, PA-C  04/13/2019, 10:04 AM   [phone]

## 2019-04-13 NOTE — Progress Notes (Signed)
Patient Entresto and metoprolol held tonight due to low BP.  Confirmed with Schorr, NP. Patient BP 82/59 at start of shift. Patient asymptomatic.  BP rechecked multiple times with highest reading of 97/58.

## 2019-04-13 NOTE — Progress Notes (Signed)
PROGRESS NOTE  [name] [name] XJO:[id number] DOB: [birth year] DOA: 04/09/2019 PCP: Cassandria Anger, MD  Brief History   I have seen and assessed patient and agree with Dyanne Carrel, NP's assessment and plan. Patient is a pleasant 85 year old female with a history of hypertension, hyperlipidemia, GERD, depression, atrial fibrillation on chronic anticoagulation with Eliquis, breast cancer status post bilateral mastectomy, history of systolic heart failure EF of 45%, chronic kidney disease stage III presented to the ED with chest pain and shortness of breath. Patient admitted and being treated for an acute CHF exacerbation.  The patient has been admitted to a telemetry bed. Cardiology has been consulted. He is being diuresed. She has been ruled out for MI by EKG and enzyme criteria.  Consultants  . Cardiology  Procedures  . None  Antibiotics   Anti-infectives (From admission, onward)   None    .  Subjective  The patient is resting comfortably. No new complaints.  Objective   Vitals:  Vitals:   04/13/19 0348 04/13/19 0739  BP: 101/65 (!) 133/100  Pulse: 62 81  Resp: 18 20  Temp: 98 F (36.7 C) 98.2 F (36.8 C)  SpO2: 99% 96%    Exam:  Constitutional:  . The patient is awake, alert, and oriented x 3. No acute distress. Marland Kitchen  Respiratory:  . No increased work of breathing.  . No wheezes or rhonchi. . Scattered rales. Cardiovascular:  . Regular rate and rhythm. . No murmurs, ectopy, or gallups. . No lateral PMI. No thrills. Abdomen:  . Abdomen is soft, non-tender, non-distended . No hernias, masses, or organomegaly . Normoactive bowel sounds.  Musculoskeletal:  . No cyanosis or clubbing. +2 pitting edema bilaterally. Skin:  . No rashes, lesions, ulcers . palpation of skin: no induration or nodules Neurologic:  . CN 2-12 intact . Sensation all 4 extremities intact Psychiatric:  . Mental status o Mood, affect appropriate o Orientation to person, place, time   . judgment and insight appear intact    I have personally reviewed the following:   Today's Data  . BMP, Vitals, I's and O's.  Scheduled Meds: . apixaban  5 mg Oral BID  . diphenhydrAMINE  25 mg Oral QHS  . [START ON 04/14/2019] furosemide  40 mg Oral Daily  . metoprolol tartrate  12.5 mg Oral BID  . multivitamin with minerals  1 tablet Oral Daily  . pantoprazole  40 mg Oral BID  . pravastatin  40 mg Oral q1800  . sacubitril-valsartan  1 tablet Oral BID  . sodium chloride flush  3 mL Intravenous Once   Continuous Infusions:  Principal Problem:   Acute on chronic systolic CHF (congestive heart failure) (HCC) Active Problems:   Hyperlipidemia with target LDL less than 100   Essential hypertension   GERD   Permanent atrial fibrillation:  CHA2DS2-VASc Score (Eliquis)   Shortness of breath   Edema of both lower extremities   Chest pain   Elevated troponin   CKD (chronic kidney disease), stage III (HCC)   Unintentional weight loss   Abnormal echocardiogram   LOS: 3 days   A & P  Acute on chronic systolic heart failure: Questionable etiology. Patient denies any dietary indiscretions. Patient states she is compliant with her medications. Patient presented with chest pain and shortness of breath. 2D echo done8/9/20with a EF of 20-25%, left ventricular hypokinesis, akinesis of basal and mid anteroseptum as well as 2.3x1.4cm mass in posterior LA of unclear significance.Patient noted to be volume overloaded on  examination. Chest x-ray consistent with pulmonary edema. Troponins were elevated however flattened. BNP of one 385.5. Patient placed on IV Lasix with clinical improvement however not at baseline. Patient with a urine output of5.9L as inpatient. Still 10 lbs away from baseline weight per cardiology. Oxygen saturation level 100% on room air at rest. Continue Lasix 40 mg q12. Cozaar, Norvasc, and entresto are held to allow for more robust diuresis per cardiology.   Left  atrial mass: To be treated with apixaban. Pt will need to be followed by repeate echocardiogram in 3 months.  Evaluated by cardiology who recommend continuing IV lasix and repeating TEE for mass in 1-3 months as well as continuing anticoagulation.   Chest pain: Patient has been ruled out for MI by EKG and enzyme criteria. Likely demand ischemia secondary to acute on chronic systolic heart failure. CTA negative for PE. Remains pain free this am.No further pain. Mobilize with PT.  Chronic kidney disease stage IIIcreatinine 1.41 this am trending up from 1.01 on admission. Due to diuresis. Avoid nephrotoxic substances and hypotension. Monitor creatinine and electrolytes as well as volume status.  Permanent atrial fibrillation CHA2DS2Vascscore is 5 Currently rate controlled. Patient was on metoprolol in the past but currently not on metoprolol. Continue Eliquis for anticoagulation.  Unintentional weight loss: Patient noted to have a 70 pound unintentional weight loss. Etiology unclear. Patient with prior history of breast cancer. CT abdomen and pelvis was done as well as CT angiogram chest done which was negative for metastatic disease. Outpatient follow-up with PCP.  Gastroesophageal reflux disease eating well and continue PPI.  Hyperlipidemia: Patient noted to be on pravastatin in the past. Patient on any statin. Fasting lipid panel with LDL 138. Resume statin. Outpatient follow-up.  Hypokalemia: Likely secondary to diuresis.Supplemented. Monitor.  Hypertension Blood pressurelow end of normal. Home norvasc was discontinuedto allow room for diuresis. Too low to allow for addition of BB for now. Continue lasix for diuresis. Entresti held to allow more blood pressure for diuresis.  I have seen and examined this patient myself. I have spent 35 minutes in his evaluation and care.  Code Status: full Family Communication: ralph son on the phone Disposition Plan: home tomorrow  hopefully DVT Prophylaxis: Eliquis.  Lanissa Cashen, DO [facility] Direct contact: see [email or web address]  7PM-7AM contact night coverage as above 04/13/2019, 2:29 PM  LOS: 3 days

## 2019-04-13 NOTE — Progress Notes (Signed)
Patient refused standing weight this AM.

## 2019-04-13 NOTE — Telephone Encounter (Signed)
New Message    Patient has a TOC on 8/25 with Roby Lofts

## 2019-04-13 NOTE — Plan of Care (Signed)

## 2019-04-13 NOTE — Care Management Important Message (Signed)
Important Message  Patient Details  Name: [name] [name] MRN: [id number] Date of Birth: [birth year]   Medicare Important Message Given:  Yes     Shelda Altes 04/13/2019, 2:32 PM

## 2019-04-14 LAB — BASIC METABOLIC PANEL
Anion gap: 12 (ref 5–15)
BUN: 23 mg/dL (ref 8–23)
CO2: 31 mmol/L (ref 22–32)
Calcium: 9.1 mg/dL (ref 8.9–10.3)
Chloride: 95 mmol/L — ABNORMAL LOW (ref 98–111)
Creatinine, Ser: 1.48 mg/dL — ABNORMAL HIGH (ref 0.44–1.00)
GFR calc Af Amer: 37 mL/min — ABNORMAL LOW (ref >60)
GFR calc non Af Amer: 32 mL/min — ABNORMAL LOW (ref >60)
Glucose, Bld: 93 mg/dL (ref 70–99)
Potassium: 3.6 mmol/L (ref 3.5–5.1)
Sodium: 138 mmol/L (ref 135–145)

## 2019-04-14 MED ORDER — LORAZEPAM 1 MG PO TABS
1.0000 mg | ORAL_TABLET | Freq: Once | ORAL | Status: AC
  Administered 2019-04-14: 1 mg via ORAL
  Filled 2019-04-14: qty 1

## 2019-04-14 MED ORDER — SACUBITRIL-VALSARTAN 24-26 MG PO TABS
1.0000 | ORAL_TABLET | Freq: Two times a day (BID) | ORAL | Status: DC
  Filled 2019-04-14: qty 1

## 2019-04-14 MED ORDER — SACUBITRIL-VALSARTAN 24-26 MG PO TABS: 1.0000 | ORAL_TABLET | Freq: Two times a day (BID) | ORAL | Status: DC

## 2019-04-14 MED ORDER — FUROSEMIDE 20 MG PO TABS
20.0000 mg | ORAL_TABLET | Freq: Every day | ORAL | Status: DC
  Filled 2019-04-14: qty 1

## 2019-04-14 NOTE — Progress Notes (Signed)
BP conts be soft NP informed last pm reported would alert rounding MD to continue to hold entresto and lopressor till am MD can evaluate and make changes as appropriate.

## 2019-04-14 NOTE — Progress Notes (Signed)
PROGRESS NOTE  [name] [name] VEL:[id number] DOB: [birth year] DOA: 04/09/2019 PCP: Cassandria Anger, MD  Brief History   I have seen and assessed patient and agree with Dyanne Carrel, NP's assessment and plan. Patient is a pleasant 85 year old female with a history of hypertension, hyperlipidemia, GERD, depression, atrial fibrillation on chronic anticoagulation with Eliquis, breast cancer status post bilateral mastectomy, history of systolic heart failure EF of 45%, chronic kidney disease stage III presented to the ED with chest pain and shortness of breath. Patient admitted and being treated for an acute CHF exacerbation.  The patient has been admitted to a telemetry bed. Cardiology has been consulted. He is being diuresed. She has been ruled out for MI by EKG and enzyme criteria.  Blood pressures have been low. Losartan and norvasc held, metoprolol and lasix dose reduced.   Consultants  . Cardiology  Procedures  . None  Antibiotics   Anti-infectives (From admission, onward)   None     Subjective  The patient is resting comfortably. No new complaints.  Objective   Vitals:  Vitals:   04/14/19 0815 04/14/19 1241  BP: 98/66 100/64  Pulse: 81 (!) 58  Resp: 18 20  Temp: 97.6 F (36.4 C) (!) 97.4 F (36.3 C)  SpO2: 97% 98%    Exam:  Constitutional:  . The patient is awake, alert, and oriented x 3. No acute distress. Marland Kitchen  Respiratory:  . No increased work of breathing.  . No wheezes or rhonchi. . Scattered rales. Cardiovascular:  . Regular rate and rhythm. . No murmurs, ectopy, or gallups. . No lateral PMI. No thrills. Abdomen:  . Abdomen is soft, non-tender, non-distended . No hernias, masses, or organomegaly . Normoactive bowel sounds.  Musculoskeletal:  . No cyanosis or clubbing. +2 pitting edema bilaterally. Skin:  . No rashes, lesions, ulcers . palpation of skin: no induration or nodules Neurologic:  . CN 2-12 intact . Sensation all 4 extremities intact  Psychiatric:  . Mental status o Mood, affect appropriate o Orientation to person, place, time  . judgment and insight appear intact    I have personally reviewed the following:   Today's Data  . BMP, Vitals, I's and O's.  Scheduled Meds: . apixaban  5 mg Oral BID  . diphenhydrAMINE  25 mg Oral QHS  . [START ON 04/15/2019] furosemide  20 mg Oral Daily  . metoprolol tartrate  12.5 mg Oral BID  . multivitamin with minerals  1 tablet Oral Daily  . pantoprazole  40 mg Oral BID  . pravastatin  40 mg Oral q1800  . [START ON 04/15/2019] sacubitril-valsartan  1 tablet Oral BID  . sodium chloride flush  3 mL Intravenous Once   Continuous Infusions:  Principal Problem:   Acute on chronic systolic CHF (congestive heart failure) (HCC) Active Problems:   Hyperlipidemia with target LDL less than 100   Essential hypertension   GERD   Permanent atrial fibrillation:  CHA2DS2-VASc Score (Eliquis)   Shortness of breath   Edema of both lower extremities   Chest pain   Elevated troponin   CKD (chronic kidney disease), stage III (HCC)   Unintentional weight loss   Abnormal echocardiogram   LOS: 4 days   A & P  Acute on chronic systolic heart failure: Questionable etiology. Patient denies any dietary indiscretions. Patient states she is compliant with her medications. Patient presented with chest pain and shortness of breath. 2D echo done8/9/20with a EF of 20-25%, left ventricular hypokinesis, akinesis of basal and  mid anteroseptum as well as 2.3x1.4cm mass in posterior LA of unclear significance.Patient noted to be volume overloaded on examination. Chest x-ray consistent with pulmonary edema. Troponins were elevated however flattened. BNP of one 385.5. Patient placed on IV Lasix with clinical improvement however not at baseline. Patient with a urine output of5.9L as inpatient. Still 10 lbs away from baseline weight per cardiology. Oxygen saturation level 100% on room air at rest.  Continue Lasix 420mg  q12. Cozaar, Norvasc, and entresto are held to allow for more robust diuresis per cardiology.   Left atrial mass: To be treated with apixaban. Pt will need to be followed by repeate echocardiogram in 3 months.  Evaluated by cardiology who recommend continuing IV lasix and repeating TEE for mass in 1-3 months as well as continuing anticoagulation.   Chest pain: Patient has been ruled out for MI by EKG and enzyme criteria. Likely demand ischemia secondary to acute on chronic systolic heart failure. CTA negative for PE. Remains pain free this am.No further pain. Mobilize with PT.  Chronic kidney disease stage IIIcreatinine 1.48 this am trending up from 1.01 on admission. Due to diuresis. Avoid nephrotoxic substances and hypotension. Monitor creatinine and electrolytes as well as volume status.  Permanent atrial fibrillation CHA2DS2Vascscore is 5. Currently rate controlled. Patient was on metoprolol in the past but currently not on metoprolol. Continue Eliquis for anticoagulation.  Unintentional weight loss: Patient noted to have a 70 pound unintentional weight loss. Etiology unclear. Patient with prior history of breast cancer. CT abdomen and pelvis was done as well as CT angiogram chest done which was negative for metastatic disease. Outpatient follow-up with PCP.  Gastroesophageal reflux disease eating well and continue PPI.  Hyperlipidemia: Patient noted to be on pravastatin in the past. Patient on any statin. Fasting lipid panel with LDL 138. Resume statin. Outpatient follow-up.  Hypokalemia: Likely secondary to diuresis.Supplemented. Monitor.  Hypertension: Blood pressures have beenlow. Home norvasc, entresto and losartan have been held to allow room for diuresis. Low dose BB started with parameters. Continue lasix for diuresis.  I have seen and examined this patient myself. I have spent 32 minutes in his evaluation and care.  Code Status: full Family  Communication: ralph son on the phone Disposition Plan: home tomorrow hopefully DVT Prophylaxis: Eliquis.  Ac Colan, DO [facility] Direct contact: see [email or web address]  7PM-7AM contact night coverage as above 04/14/2019, 3:06 PM  LOS: 3 days

## 2019-04-14 NOTE — Progress Notes (Signed)
Occupational Therapy Treatment Patient Details Name: [name] [name] MRN: [id number] DOB: 08/30/1933 Today's Date: 04/14/2019    History of present illness 85 yo admitted with acute on chronic CHF with demand ischemia. PMhx: CHF, CKD, Afib, HTN, HLD   OT comments  Pt progressing towards acute OT goals. Focus of session was practicing tub transfer and educating on energy conservation strategies during ADLs/IADLs. D/c plan remains appropriate.   Follow Up Recommendations  No OT follow up    Equipment Recommendations  None recommended by OT    Recommendations for Other Services      Precautions / Restrictions Precautions Precautions: Fall Restrictions Weight Bearing Restrictions: No       Mobility Bed Mobility Overal bed mobility: Modified Independent             General bed mobility comments: HOB flat  Transfers Overall transfer level: Modified independent Equipment used: Rolling walker (2 wheeled)                  Balance Overall balance assessment: Needs assistance Sitting-balance support: No upper extremity supported Sitting balance-Leahy Scale: Good       Standing balance-Leahy Scale: Fair Standing balance comment: pt able to static stand without support but reliant on UE support for gait                           ADL either performed or assessed with clinical judgement   ADL Overall ADL's : Needs assistance/impaired                         Toilet Transfer: Supervision/safety;Ambulation;RW       Tub/ Shower Transfer: Tub transfer;Min guard;Ambulation;3 in 1;Rolling walker Tub/Shower Transfer Details (indicate cue type and reason): simulated tub transfer in room Functional mobility during ADLs: Supervision/safety;Rolling walker General ADL Comments: Simulated tub transfer and energy conservation education     Vision       Perception     Praxis      Cognition Arousal/Alertness: Awake/alert Behavior During  Therapy: WFL for tasks assessed/performed Overall Cognitive Status: Within Functional Limits for tasks assessed                                          Exercises     Shoulder Instructions       General Comments Pt reported feeling fatigue at start of session.    Pertinent Vitals/ Pain       Pain Assessment: No/denies pain  Home Living                                          Prior Functioning/Environment              Frequency  Min 2X/week        Progress Toward Goals  OT Goals(current goals can now be found in the care plan section)  Progress towards OT goals: Progressing toward goals  Acute Rehab OT Goals Patient Stated Goal: sleep better OT Goal Formulation: With patient Time For Goal Achievement: 04/26/19 Potential to Achieve Goals: Good ADL Goals Pt Will Perform Tub/Shower Transfer: Tub transfer;with modified independence;ambulating;shower seat Additional ADL Goal #1: Pt will state at least 3 energy conservation strategies as instructed.  Plan Discharge plan remains appropriate    Co-evaluation                 AM-PAC OT "6 Clicks" Daily Activity     Outcome Measure   Help from another person eating meals?: None Help from another person taking care of personal grooming?: A Little Help from another person toileting, which includes using toliet, bedpan, or urinal?: None Help from another person bathing (including washing, rinsing, drying)?: None Help from another person to put on and taking off regular upper body clothing?: None Help from another person to put on and taking off regular lower body clothing?: None 6 Click Score: 23    End of Session Equipment Utilized During Treatment: Gait belt;Rolling walker  OT Visit Diagnosis: Other abnormalities of gait and mobility (R26.89)   Activity Tolerance Patient tolerated treatment well   Patient Left in bed;with call bell/phone within reach   Nurse  Communication          Time: 6468-0321 OT Time Calculation (min): 13 min  Charges: OT General Charges $OT Visit: 1 Visit OT Treatments $Self Care/Home Management : 8-22 mins  [name] [name], OT Acute Rehabilitation Services Pager: [phone] Office: [phone]    [name] [name] 04/14/2019, 1:14 PM

## 2019-04-14 NOTE — Progress Notes (Signed)
Physical Therapy Treatment Patient Details Name: [name] [name] MRN: [id number] DOB: 1932-12-01 Today's Date: 04/14/2019    History of Present Illness 85 yo admitted with acute on chronic CHF with demand ischemia. PMhx: CHF, CKD, Afib, HTN, HLD    PT Comments    Pt pleasant and eager to be able to get OOB with BP remaining low but MAP >70 throughout. Pt with improved gait speed, distance and balance with use of RW this session. Pt reports she does have RW readily accessible at home and will agree to transitioning to RW for increased balance and activity tolerance. Pt able to perform stand pivot bed to chair without assist and encouraged to continue mobility and hall ambulation with RW acutely.   Orthostatic BPs  Supine 94/74 (82), HR 73  Sitting 82/65 (71), HR 69     Standing 91/75 (82), HR75  Sitting after walk 102/81 (89), HR 106      Follow Up Recommendations  No PT follow up     Equipment Recommendations  None recommended by PT    Recommendations for Other Services       Precautions / Restrictions Precautions Precautions: Fall Restrictions Weight Bearing Restrictions: No    Mobility  Bed Mobility Overal bed mobility: Modified Independent             General bed mobility comments: HOB flat  Transfers Overall transfer level: Modified independent                  Ambulation/Gait Ambulation/Gait assistance: Supervision Gait Distance (Feet): 350 Feet Assistive device: Rolling walker (2 wheeled) Gait Pattern/deviations: Step-through pattern;Decreased stride length Gait velocity: .97 ft/sec Gait velocity interpretation: <1.8 ft/sec, indicate of risk for recurrent falls General Gait Details: cues for posture and position in RW. Pt agreeable to attempt RW use with pt reporting increased comfort and stability with RW with pt able to increased gait speed with RW   Stairs             Wheelchair Mobility    Modified Rankin (Stroke Patients  Only)       Balance Overall balance assessment: Needs assistance   Sitting balance-Leahy Scale: Good       Standing balance-Leahy Scale: Fair Standing balance comment: pt able to static stand without support but reliant on UE support for gait                            Cognition Arousal/Alertness: Awake/alert Behavior During Therapy: WFL for tasks assessed/performed Overall Cognitive Status: Within Functional Limits for tasks assessed                                        Exercises      General Comments        Pertinent Vitals/Pain Pain Assessment: No/denies pain    Home Living                      Prior Function            PT Goals (current goals can now be found in the care plan section) Progress towards PT goals: Progressing toward goals    Frequency           PT Plan Current plan remains appropriate    Co-evaluation  AM-PAC PT "6 Clicks" Mobility   Outcome Measure  Help needed turning from your back to your side while in a flat bed without using bedrails?: None Help needed moving from lying on your back to sitting on the side of a flat bed without using bedrails?: None Help needed moving to and from a bed to a chair (including a wheelchair)?: None Help needed standing up from a chair using your arms (e.g., wheelchair or bedside chair)?: None Help needed to walk in hospital room?: A Little Help needed climbing 3-5 steps with a railing? : A Little 6 Click Score: 22    End of Session Equipment Utilized During Treatment: Gait belt Activity Tolerance: Patient tolerated treatment well Patient left: in chair;with call bell/phone within reach Nurse Communication: Mobility status PT Visit Diagnosis: Other abnormalities of gait and mobility (R26.89);Muscle weakness (generalized) (M62.81)     Time: 3013-1438 PT Time Calculation (min) (ACUTE ONLY): 24 min  Charges:  $Gait Training: 8-22  mins $Therapeutic Activity: 8-22 mins                     Ennio Houp Pam Drown, PT Acute Rehabilitation Services Pager: [phone] Office: [place] 04/14/2019, 12:31 PM

## 2019-04-14 NOTE — TOC Initial Note (Signed)
Transition of Care [facility]) - Initial/Assessment Note    Patient Details  Name: [name] [name] MRN: [id number] Date of Birth: July 05, 1933  Transition of Care [facility]) CM/SW Contact:    [name] Collet, RN Phone Number: 04/14/2019, 3:26 PM  Clinical Narrative:                [name] [name] w patient at bedside. Provided with [facility] card and explained use. Patient verbalized understanding. She states that she has RW and does not want rollator. No HH needs at this time. No other CM needs identified at this time.    Expected Discharge Plan: Home/Self Care Barriers to Discharge: Continued Medical Work up   Patient Goals and CMS Choice        Expected Discharge Plan and Services Expected Discharge Plan: Home/Self Care                         DME Arranged: N/A                    Prior Living Arrangements/Services                       Activities of Daily Living Home Assistive Devices/Equipment: Cane (specify quad or straight), Eyeglasses ADL Screening (condition at time of admission) Patient's cognitive ability adequate to safely complete daily activities?: Yes Is the patient deaf or have difficulty hearing?: No Does the patient have difficulty seeing, even when wearing glasses/contacts?: No Does the patient have difficulty concentrating, remembering, or making decisions?: No Patient able to express need for assistance with ADLs?: Yes Does the patient have difficulty dressing or bathing?: No Independently performs ADLs?: Yes (appropriate for developmental age) Does the patient have difficulty walking or climbing stairs?: Yes Weakness of Legs: Both Weakness of Arms/Hands: None  Permission Sought/Granted                  Emotional Assessment              Admission diagnosis:  Shortness of breath [R06.02] Chest pain, unspecified type [R07.9] Patient Active Problem List   Diagnosis Date Noted  . Abnormal echocardiogram   . Unintentional weight loss 04/10/2019   . Chest pain 04/09/2019  . Elevated troponin 04/09/2019  . CKD (chronic kidney disease), stage III ([place]) 04/09/2019  . Acute on chronic systolic CHF (congestive heart failure) ([place]) 03/08/2019  . Shortness of breath 02/28/2019  . Edema of both lower extremities 02/28/2019  . Dysphagia 09/28/2018  . Anosmia 08/18/2018  . Sweats, menopausal 08/18/2018  . Urinary incontinence 08/18/2018  . UTI (urinary tract infection) due to Enterococcus 08/06/2018  . Chest discomfort 08/04/2018  . Murmur, cardiac 07/28/2018  . Permanent atrial fibrillation:  CHA2DS2-VASc Score (Eliquis) 07/28/2018  . Sciatica associated with disorder of lumbar spine 02/26/2018  . Hip pain, chronic, left 02/26/2018  . Acute pharyngitis 07/13/2017  . Actinic keratosis 07/13/2017  . Wart viral 07/01/2017  . Edema 12/19/2016  . Right leg pain 11/03/2016  . Knee pain, left 07/17/2016  . Bell palsy 10/22/2015  . Taste sense altered 10/22/2015  . Mouth droop due to facial weakness 10/12/2015  . Sialoadenitis of submandibular gland 10/04/2015  . Fatigue 10/04/2015  . Cold intolerance 04/23/2015  . Headache 12/29/2014  . Total knee replacement status 05/24/2014  . Lymphedema of upper extremity following lymphadenectomy 05/22/2014  . Preop exam for internal medicine 05/22/2014  . Breast cancer ([place]) 03/17/2014  . Chronic cholecystitis 11/15/2013  .  Abnormal gallbladder x-ray 11/08/2013  . Abdominal tenderness of left lower quadrant 11/02/2013  . Diverticulitis of colon without hemorrhage 11/02/2013  . Hematuria 11/02/2013  . Hypokalemia 11/02/2013  . Obstructive chronic bronchitis with exacerbation ([place]) 09/05/2013  . Mass of right forearm 02/07/2013  . Well adult exam 02/04/2013  . Lipoma of forearm 02/04/2013  . Mass of left side of neck 01/06/2012  . Diverticulitis 10/10/2011  . Nausea & vomiting 04/16/2011  . Chills 04/16/2011  . LLQ abdominal pain 04/16/2011  . Abdominal pain, epigastric 08/14/2010  .  GERD 06/27/2010  . SKIN CANCER, HX OF 01/31/2010  . CHILLS WITHOUT FEVER 05/04/2009  . Rash and nonspecific skin eruption 11/21/2008  . Herpes zoster 10/04/2008  . PARESTHESIA 10/04/2008  . GRIEF REACTION 08/15/2008  . Depression 08/15/2008  . Osteoarthritis 08/15/2008  . HYPOKALEMIA 12/22/2007  . Weight loss 10/21/2007  . Pain in Soft Tissues of Limb 06/21/2007  . B12 deficiency 03/27/2007  . Vitamin D deficiency 03/27/2007  . Hyperlipidemia with target LDL less than 100 03/27/2007  . ANEMIA-IRON DEFICIENCY 03/27/2007  . Insomnia 03/27/2007  . Essential hypertension 03/27/2007  . BRONCHITIS, ACUTE 03/27/2007  . DIVERTICULOSIS, COLON 03/27/2007   PCP:  Cassandria Anger, MD Pharmacy:   [facility] [place] [address] Phone: [phone] Fax: [phone]     Social Determinants of Health (SDOH) Interventions    Readmission Risk Interventions No flowsheet data found.

## 2019-04-14 NOTE — Consult Note (Signed)
   [facility] Inpatient Consult   04/14/2019  JUNO ALERS 24-Jul-1933 [id number]   Patient was assessed for [place] for community services. Patient was previously active with [facility] pharmacy for medication assistance and adherence follow up.    Chart review and patient's notes that she is having blood pressure issues.   Reviewed patient's notes from and not limited to her brief history note today as seen:  Patient is a pleasant 85 year old female with a history of hypertension, hyperlipidemia, GERD, depression, atrial fibrillation on chronic anticoagulation with Eliquis, breast cancer status post bilateral mastectomy, history of systolic heart failure EF of 45%, chronic kidney disease stage III presented to the ED with chest pain and shortness of breath. Patient admitted and being treated for an acute CHF exacerbation.  Spoke with the patient via hospital phone, HIPAA verified.  Patient states she is feeling a little weak today.  She states her concern is for Eliquis cost and medicine for her blood pressure.  She desires [facility] Pharmacist to follow up for medication review.  She states said she will ask for a nurse if the pharmacist thinks she will need it. States, "I get a call from my [place] for now."  Plan: Follow up with inpatient TOC RNCM that [facility] can follow.  Will follow for disposition and assign.    Of note, [facility] Care Management services does not replace or interfere with any services that are arranged by inpatient [facility] care management team.   For additional questions or referrals please contact:  Natividad Brood, RN BSN [facility] Liaison  [phone] business mobile phone Toll free office [phone]  Fax number: [phone] [email or web address]

## 2019-04-14 NOTE — Telephone Encounter (Signed)
Patient still currently admitted.  

## 2019-04-14 NOTE — TOC Benefit Eligibility Note (Signed)
Transition of Care [facility]) Benefit Eligibility Note    Patient Details  Name: [name] [name] MRN: [id number] Date of Birth: 1932/09/06   Medication/Dose: Delene Loll  24-26- MG BID  Covered?: Yes  Tier: 3 Drug  Prescription Coverage Preferred Pharmacy: [facility]  AND [facility]  , 90 DAY SUPPLY FOR M/O $120.00  Spoke with Person/Company/Phone Number:: JANE  @ [employer] # [phone]  Co-Pay: $ 47.00   Q/L TWO PILL PERT DAY  Prior Approval: No  Deductible: Unmet       Memory Argue Phone Number: 04/14/2019, 2:12 PM

## 2019-04-14 NOTE — Progress Notes (Signed)
Progress Note  Patient Name: [name] [name] Date of Encounter: 04/14/2019  Primary Cardiologist: Glenetta Hew, MD   Subjective   Hypotension noted overnight. Nursing holding entresto and BB, but lasix given today. Creatinine has been rising, suspect she is now at diuresis endpoint.  Inpatient Medications    Scheduled Meds: . apixaban  5 mg Oral BID  . diphenhydrAMINE  25 mg Oral QHS  . furosemide  40 mg Oral Daily  . metoprolol tartrate  12.5 mg Oral BID  . multivitamin with minerals  1 tablet Oral Daily  . pantoprazole  40 mg Oral BID  . pravastatin  40 mg Oral q1800  . sacubitril-valsartan  1 tablet Oral BID  . sodium chloride flush  3 mL Intravenous Once   Continuous Infusions:  PRN Meds: acetaminophen, dextromethorphan-guaiFENesin, hydrALAZINE, levalbuterol, magnesium hydroxide, morphine injection, nitroGLYCERIN, ondansetron (ZOFRAN) IV, zolpidem   Vital Signs    Vitals:   04/13/19 2037 04/14/19 0038 04/14/19 0531 04/14/19 0815  BP: 94/69  100/72 98/66  Pulse:   81 81  Resp:   18 18  Temp:   (!) 97.2 F (36.2 C) 97.6 F (36.4 C)  TempSrc:    Oral  SpO2:   100% 97%  Weight:  80.9 kg    Height:        Intake/Output Summary (Last 24 hours) at 04/14/2019 1029 Last data filed at 04/14/2019 0800 Gross per 24 hour  Intake 1080 ml  Output 2100 ml  Net -1020 ml   Filed Weights   04/12/19 0508 04/13/19 0348 04/14/19 0038  Weight: 80.6 kg 79.8 kg 80.9 kg    Telemetry    Atrial fibrillation with rates up to 140s this morning - Personally Reviewed  Physical Exam   General appearance: alert and no distress Neck: no carotid bruit, no JVD and thyroid not enlarged, symmetric, no tenderness/mass/nodules Lungs: clear to auscultation bilaterally Heart: irregularly irregular rhythm Abdomen: soft, non-tender; bowel sounds normal; no masses,  no organomegaly Extremities: extremities normal, atraumatic, no cyanosis or edema Pulses: 2+ and symmetric Skin: Skin  color, texture, turgor normal. No rashes or lesions Neurologic: Grossly normal Psych: Pleasant  Labs    Chemistry Recent Labs  Lab 04/09/19 1738  04/12/19 0413 04/13/19 0459 04/14/19 0620  NA 140   < > 138 137 138  K 3.7   < > 3.5 3.7 3.6  CL 102   < > 95* 97* 95*  CO2 26   < > 31 30 31   GLUCOSE 106*   < > 86 100* 93  BUN 10   < > 18 23 23   CREATININE 1.01*   < > 1.20* 1.41* 1.48*  CALCIUM 9.9   < > 9.1 9.0 9.1  PROT 7.0  --   --   --   --   ALBUMIN 3.7  --   --   --   --   AST 33  --   --   --   --   ALT 28  --   --   --   --   ALKPHOS 61  --   --   --   --   BILITOT 0.6  --   --   --   --   GFRNONAA 51*   < > 41* 34* 32*  GFRAA 59*   < > 48* 40* 37*  ANIONGAP 12   < > 12 10 12    < > = values in this interval not displayed.  Hematology Recent Labs  Lab 04/09/19 1738 04/11/19 0620  WBC 5.4 4.7  RBC 4.56 4.30  HGB 12.8 11.9*  HCT 39.2 36.7  MCV 86.0 85.3  MCH 28.1 27.7  MCHC 32.7 32.4  RDW 15.8* 15.7*  PLT 211 185    Cardiac EnzymesNo results for input(s): TROPONINI in the last 168 hours. No results for input(s): TROPIPOC in the last 168 hours.   BNP Recent Labs  Lab 04/09/19 2201  BNP 1,385.5*     DDimer  Recent Labs  Lab 04/09/19 2224  DDIMER 1.29*     Radiology    No results found.  Cardiac Studies   Echocardiogram 04/10/2019:  IMPRESSIONS   1. The left ventricle has severely reduced systolic function, with an ejection fraction of 20-25%. The cavity size was mildly dilated. Left ventricular diastolic function could not be evaluated secondary to atrial fibrillation. Elevated left atrial and  left ventricular end-diastolic pressures Left ventricular diffuse hypokinesis. 2. There is akinesis of the basal and mid infero and anteroseptum, apical septum, apical anterior and apical inferolaterl akinesis. 3. The right ventricle has moderately reduced systolic function. The cavity was severely enlarged. There is no increase in right  ventricular wall thickness. Right ventricular systolic pressure is moderately elevated at 21mmhg. 4. Left atrial size was severely dilated. 5. There is a 2.3 x 1.43cm mass in the posterior LA of uncler significance. Cannot rule out thrombus. Consider TEE if clinically indicated. 6. Right atrial size was severely dilated. 7. Mild thickening of the mitral valve leaflet. Mitral valve regurgitation is mild to moderate by color flow Doppler. 8. The aortic valve is tricuspid. Moderate sclerosis of the aortic valve. Aortic valve regurgitation was not assessed by color flow Doppler. Moderate aortic annular calcification noted. 9. Tricuspid valve regurgitation is moderate. 10. The aorta is normal in size and structure. 11. The inferior vena cava was dilated in size with <50% respiratory variability  Patient Profile     85 yo female with acute on chronic systolic CHF, non-obstructive mild CAD on coronary CTA 09/2018, permanent afib and CKD 3 with newly reduced LVEF to 25% and possible LA mass on echo for which cardiology is following.  Assessment & Plan    1. Acute on chronic combined CHF: patient presented with chest pain and SOB. Trop mildly elevated to 26 x2 (suspect demand ischemia). EKG non-ischemic. BNP elevated to 1300. Echo with EF 20-25% (previously 45-50% 12/2017) She was started on IV lasix with UOP net -2.0L in the past 24 hours and -5.9L this admission. Weight 184lbs on admission > 175lbs today. Transitioned from losartan to entresto. Cr up to 1.4 today (baseline 1.0) - Hypotension overnight- entresto and BB held - Probably at diuretic endpoint- hold lasix today. Re-start reduced dose lasix 20 mg daily tomorrow. - Plan for repeat echo in 3 months to monitor for improvement in LV function   2. LA mass: suspected thrombus vs mass vs artifact on echo 04/10/2019 - Continue apixaban 5mg  BID  - Plan to repeat echo in 1-3 months to evaluate for resolution. If mass persists at that time, may  need additional imaging studies to further characterize findings.   3. Permanent atrial fibrillation: rates elevated this morning; not on any AV nodal blocking agents - Rates controlled on BB - Continue apixaban for stroke ppx  4. Non-obstructive CAD: noted on coronary CTA 09/2018. Not on aspirin given need for anticoagulation - Continue statin for risk factor modifications  5. HLD: LDL 138 on labs this admission;  goal <70. Pravastatin increased to 40mg  daily  - Continue pravastatin  Probably ok for d/c home later today if BP improves. Start lower dose 24/26 mg BID Entresto tomorrow and resume lasix tomorrow at 20 mg daily. We have arranged for follow-up in 2 weeks.  For questions or updates, please contact [place] Please consult [email or web address] for contact info under Cardiology/STEMI.   Pixie Casino, MD, [facility], [place] Director of the Advanced Lipid Disorders &  Cardiovascular Risk Reduction Clinic Diplomate of the American Board of Clinical Lipidology Attending Cardiologist  Direct Dial: [phone]  Fax: [phone]  Website:  [email or web address]  Pixie Casino, MD  04/14/2019, 10:29 AM

## 2019-04-14 NOTE — Plan of Care (Signed)

## 2019-04-15 LAB — BASIC METABOLIC PANEL
Anion gap: 10 (ref 5–15)
BUN: 23 mg/dL (ref 8–23)
CO2: 29 mmol/L (ref 22–32)
Calcium: 9.2 mg/dL (ref 8.9–10.3)
Chloride: 100 mmol/L (ref 98–111)
Creatinine, Ser: 1.29 mg/dL — ABNORMAL HIGH (ref 0.44–1.00)
GFR calc Af Amer: 44 mL/min — ABNORMAL LOW (ref >60)
GFR calc non Af Amer: 38 mL/min — ABNORMAL LOW (ref >60)
Glucose, Bld: 95 mg/dL (ref 70–99)
Potassium: 3.5 mmol/L (ref 3.5–5.1)
Sodium: 139 mmol/L (ref 135–145)

## 2019-04-15 MED ORDER — FUROSEMIDE 20 MG PO TABS: 20.0000 mg | ORAL_TABLET | Freq: Every day | ORAL | Status: DC

## 2019-04-15 MED ORDER — SACUBITRIL-VALSARTAN 24-26 MG PO TABS: 1.0000 | ORAL_TABLET | Freq: Two times a day (BID) | ORAL | 1 refills | Status: DC

## 2019-04-15 MED ORDER — METOPROLOL TARTRATE 25 MG PO TABS: 12.5000 mg | ORAL_TABLET | Freq: Two times a day (BID) | ORAL | 1 refills | Status: DC

## 2019-04-15 MED ORDER — SACUBITRIL-VALSARTAN 24-26 MG PO TABS: 1.0000 | ORAL_TABLET | Freq: Two times a day (BID) | ORAL | Status: DC

## 2019-04-15 MED ORDER — LORAZEPAM 1 MG PO TABS
1.0000 mg | ORAL_TABLET | Freq: Once | ORAL | Status: AC
  Administered 2019-04-15: 1 mg via ORAL
  Filled 2019-04-15: qty 1

## 2019-04-15 NOTE — Progress Notes (Signed)
Progress Note  Patient Name: [name] [name] Date of Encounter: 04/15/2019  Primary Cardiologist: Glenetta Hew, MD   Subjective   Some RVR with afib overnight- BB being held apparently due to high hold parameters. Has had borderline low BP. Suspect she is somewhat dry.  Inpatient Medications    Scheduled Meds: . apixaban  5 mg Oral BID  . diphenhydrAMINE  25 mg Oral QHS  . furosemide  20 mg Oral Daily  . metoprolol tartrate  12.5 mg Oral BID  . multivitamin with minerals  1 tablet Oral Daily  . pantoprazole  40 mg Oral BID  . pravastatin  40 mg Oral q1800  . sacubitril-valsartan  1 tablet Oral BID  . sodium chloride flush  3 mL Intravenous Once   Continuous Infusions:  PRN Meds: acetaminophen, dextromethorphan-guaiFENesin, hydrALAZINE, levalbuterol, magnesium hydroxide, morphine injection, nitroGLYCERIN, ondansetron (ZOFRAN) IV, zolpidem   Vital Signs    Vitals:   04/14/19 2101 04/15/19 0242 04/15/19 0623 04/15/19 0845  BP: 100/73  95/75 98/62  Pulse: 78  84 72  Resp:   16   Temp:   97.7 F (36.5 C)   TempSrc:   Oral   SpO2:   95%   Weight:  80.7 kg    Height:        Intake/Output Summary (Last 24 hours) at 04/15/2019 0853 Last data filed at 04/15/2019 0810 Gross per 24 hour  Intake 1020 ml  Output 1800 ml  Net -780 ml   Filed Weights   04/13/19 0348 04/14/19 0038 04/15/19 0242  Weight: 79.8 kg 80.9 kg 80.7 kg    Telemetry    Atrial fibrillation with rates up to 140s this morning - Personally Reviewed  Physical Exam   General appearance: alert and no distress Neck: no carotid bruit, no JVD and thyroid not enlarged, symmetric, no tenderness/mass/nodules Lungs: clear to auscultation bilaterally Heart: irregularly irregular rhythm Abdomen: soft, non-tender; bowel sounds normal; no masses,  no organomegaly Extremities: extremities normal, atraumatic, no cyanosis or edema Pulses: 2+ and symmetric Skin: Skin color, texture, turgor normal. No rashes or  lesions Neurologic: Grossly normal Psych: Pleasant  Labs    Chemistry Recent Labs  Lab 04/09/19 1738  04/13/19 0459 04/14/19 0620 04/15/19 0543  NA 140   < > 137 138 139  K 3.7   < > 3.7 3.6 3.5  CL 102   < > 97* 95* 100  CO2 26   < > 30 31 29   GLUCOSE 106*   < > 100* 93 95  BUN 10   < > 23 23 23   CREATININE 1.01*   < > 1.41* 1.48* 1.29*  CALCIUM 9.9   < > 9.0 9.1 9.2  PROT 7.0  --   --   --   --   ALBUMIN 3.7  --   --   --   --   AST 33  --   --   --   --   ALT 28  --   --   --   --   ALKPHOS 61  --   --   --   --   BILITOT 0.6  --   --   --   --   GFRNONAA 51*   < > 34* 32* 38*  GFRAA 59*   < > 40* 37* 44*  ANIONGAP 12   < > 10 12 10    < > = values in this interval not displayed.  Hematology Recent Labs  Lab 04/09/19 1738 04/11/19 0620  WBC 5.4 4.7  RBC 4.56 4.30  HGB 12.8 11.9*  HCT 39.2 36.7  MCV 86.0 85.3  MCH 28.1 27.7  MCHC 32.7 32.4  RDW 15.8* 15.7*  PLT 211 185    Cardiac EnzymesNo results for input(s): TROPONINI in the last 168 hours. No results for input(s): TROPIPOC in the last 168 hours.   BNP Recent Labs  Lab 04/09/19 2201  BNP 1,385.5*     DDimer  Recent Labs  Lab 04/09/19 2224  DDIMER 1.29*     Radiology    No results found.  Cardiac Studies   Echocardiogram 04/10/2019:  IMPRESSIONS   1. The left ventricle has severely reduced systolic function, with an ejection fraction of 20-25%. The cavity size was mildly dilated. Left ventricular diastolic function could not be evaluated secondary to atrial fibrillation. Elevated left atrial and  left ventricular end-diastolic pressures Left ventricular diffuse hypokinesis. 2. There is akinesis of the basal and mid infero and anteroseptum, apical septum, apical anterior and apical inferolaterl akinesis. 3. The right ventricle has moderately reduced systolic function. The cavity was severely enlarged. There is no increase in right ventricular wall thickness. Right ventricular  systolic pressure is moderately elevated at 13mmhg. 4. Left atrial size was severely dilated. 5. There is a 2.3 x 1.43cm mass in the posterior LA of uncler significance. Cannot rule out thrombus. Consider TEE if clinically indicated. 6. Right atrial size was severely dilated. 7. Mild thickening of the mitral valve leaflet. Mitral valve regurgitation is mild to moderate by color flow Doppler. 8. The aortic valve is tricuspid. Moderate sclerosis of the aortic valve. Aortic valve regurgitation was not assessed by color flow Doppler. Moderate aortic annular calcification noted. 9. Tricuspid valve regurgitation is moderate. 10. The aorta is normal in size and structure. 11. The inferior vena cava was dilated in size with <50% respiratory variability  Patient Profile     85 yo female with acute on chronic systolic CHF, non-obstructive mild CAD on coronary CTA 09/2018, permanent afib and CKD 3 with newly reduced LVEF to 25% and possible LA mass on echo for which cardiology is following.  Assessment & Plan    1. Acute on chronic combined CHF: patient presented with chest pain and SOB. Trop mildly elevated to 26 x2 (suspect demand ischemia). EKG non-ischemic. BNP elevated to 1300. Echo with EF 20-25% (previously 45-50% 12/2017) She was started on IV lasix with UOP net -2.0L in the past 24 hours and -5.9L this admission. Weight 184lbs on admission > 175lbs today. Transitioned from losartan to entresto. Cr up to 1.4 today (baseline 1.0) - Hypotension overnight- entresto and BB held - Will discontinue lasix, restart BB today with hold for SBP<90, P<50 - Plan for repeat echo in 3 months to monitor for improvement in LV function  - Restart low dose Entresto 24/26 mg BID tomorrow (without lasix)  2. LA mass: suspected thrombus vs mass vs artifact on echo 04/10/2019 - Continue apixaban 5mg  BID  - Plan to repeat echo in 1-3 months to evaluate for resolution. If mass persists at that time, may need  additional imaging studies to further characterize findings.   3. Permanent atrial fibrillation: rates elevated this morning; not on any AV nodal blocking agents - Rates controlled on BB - Continue apixaban for stroke ppx  4. Non-obstructive CAD: noted on coronary CTA 09/2018. Not on aspirin given need for anticoagulation - Continue statin for risk factor modifications  5.  HLD: LDL 138 on labs this admission; goal <70. Pravastatin increased to 40mg  daily  - Continue pravastatin  6. Epistaxis: scant amount of blood with sinus drainage. Suspect dry nasal mucosa. Recommend ocean nasal spray.  Probably ok for d/c home later today.  For questions or updates, please contact [place] Please consult [email or web address] for contact info under Cardiology/STEMI.   Pixie Casino, MD, [facility], [place] Director of the Advanced Lipid Disorders &  Cardiovascular Risk Reduction Clinic Diplomate of the American Board of Clinical Lipidology Attending Cardiologist  Direct Dial: [phone]  Fax: [phone]  Website:  [email or web address]  Pixie Casino, MD  04/15/2019, 8:53 AM

## 2019-04-15 NOTE — Telephone Encounter (Signed)
Patient still currently admitted.  

## 2019-04-15 NOTE — Progress Notes (Signed)
Patient changed her mind about the rollator, NCM notified Zack and he brought the rollator to patient's room.

## 2019-04-15 NOTE — Discharge Summary (Signed)
Physician Discharge Summary  [name] [name] RKY:[id number] DOB: 08/03/1933 DOA: 04/09/2019  PCP: Cassandria Anger, MD  Admit date: 04/09/2019 Discharge date: 04/15/2019  Admitted From: Home Disposition:  Home   Recommendations for Outpatient Follow-up:  1. Follow up with PCP as scheduled 2. Outpatient cardiology follow up 3. Repeat BMP at next PCP follow up  Home Health: none  Equipment/Devices: none   Discharge Condition: stable CODE STATUS: Full code Diet recommendation: low sodium, heart healthy  HPI: Per admitting MD, [name] [name] is a 85 y.o. female with medical history significant of hypertension, hyperlipidemia, GERD, depression, atrial fibrillation on Eliquis, breast cancer (s/p of bilateral mastectomy), sCHF with EF of 45%, CKD stage III, who presents with chest pain and shortness of breath.Patient states that she has been having shortness of breath for more than 2 days. She has a dry cough, but no fever or chills.  Today she developed chest pain, which is located in left side of chest, initially 10 out of 10 severity, currently 4 out of 10 in severity, pleuritic, aggravated by deep breaths.  She states that she has a generalized weakness, but no unilateral tingling or numbness in extremities.  No facial droop or slurred speech.  Denies nausea, vomiting, diarrhea, abdominal pain, symptoms of UTI.  She states that she reports that since November, her health has been steadily declining and she has lost over 70 pounds unintentionally. ED Course: pt was found to have troponin XX 6, WBC 5.4, BNP 1385, negative COVID-19 test, positive D-dimer 1.29, stable renal function, temperature normal, blood pressure 113/84, heart rate 50-1 10, oxygen saturation 94 to 96% on room air.  X-ray showed cardiomegaly and mild vascular congestion.  Patient is placed on telemetry bed for observation.  Hospital Course: Acute on chronic systolic CHF -patient was admitted to the hospital with fluid  overload, cardiology was consulted and followed patient while hospitalized.  A 2D echo done on 04/10/2019 showed an EF of 20-25%, left ventricular hypokinesis, akinesis of the basal and mid anteroseptum as well as a 2.3 x 1.4 cm mass in the posterior LA of unclear significance.  She received IV Lasix and she is net -8 L and currently appears on the dry side.  Her blood pressure has been soft following diuresis and her medication regimen has been changed so that she was switched to [facility] as well as metoprolol for rate control and per cardiology recommendations for Lasix is now on hold until her outpatient visit.  I am holding Norvasc along with losartan as well.  She has an appointment with PCP on 8/19 as well as cardiology on 8/25 Left atrial mass-to be treated with apixaban, cardiology recommends a repeat echo in about 3 months.  Possibly thrombus versus mass Chest pain-she has been ruled out for MI by EKG and enzyme criteria, this is likely demand ischemia secondary to fluid overload.  CT angiogram was negative for PE. Chronic kidney disease stage III-Baseline creatinine around 1.0-1.2, currently at baseline Permanent atrial fibrillation-rate controlled on metoprolol, continue Eliquis Unintentional weight loss-imaging with CT abdomen and pelvis and CT angiogram of the chest was negative for malignancy or any other concerning features.  Outpatient follow-up with PCP   Discharge Diagnoses:  Principal Problem:   Acute on chronic systolic CHF (congestive heart failure) (HCC) Active Problems:   Hyperlipidemia with target LDL less than 100   Essential hypertension   GERD   Permanent atrial fibrillation:  CHA2DS2-VASc Score (Eliquis)   Shortness of breath  Edema of both lower extremities   Chest pain   Elevated troponin   CKD (chronic kidney disease), stage III (HCC)   Unintentional weight loss   Abnormal echocardiogram     Discharge Instructions   Allergies as of 04/15/2019      Reactions    Anastrozole Other (See Comments)   arthralgias   Aspirin Other (See Comments)   Gi upset    Ciprofloxacin Itching   Letrozole Other (See Comments)   leg pain      Medication List    STOP taking these medications   amLODipine 10 MG tablet Commonly known as: NORVASC   furosemide 40 MG tablet Commonly known as: LASIX   losartan 100 MG tablet Commonly known as: COZAAR     TAKE these medications   acetaminophen 500 MG tablet Commonly known as: TYLENOL Take 500 mg by mouth 2 (two) times daily as needed for moderate pain.   [employer] B-12 500 MCG Subl Generic drug: Cyanocobalamin Place 2 tablets (1,000 mcg total) under the tongue daily.   diphenhydrAMINE 25 MG tablet Commonly known as: SOMINEX Take 25 mg by mouth at bedtime.   Eliquis 5 MG Tabs tablet Generic drug: apixaban TAKE 1 TABLET BY MOUTH TWICE DAILY What changed: how much to take   loratadine 10 MG tablet Commonly known as: CLARITIN Take 1 tablet (10 mg total) by mouth daily.   metoprolol tartrate 25 MG tablet Commonly known as: LOPRESSOR Take 0.5 tablets (12.5 mg total) by mouth 2 (two) times daily. What changed:   how much to take  when to take this  reasons to take this  additional instructions   multivitamin with minerals Tabs tablet Take 1 tablet by mouth daily.   pantoprazole 40 MG tablet Commonly known as: PROTONIX Take 1 tablet (40 mg total) by mouth 2 (two) times daily.   potassium chloride SA 20 MEQ tablet Commonly known as: Klor-Con M20 Take 1 tablet (20 mEq total) by mouth 2 (two) times daily.   pravastatin 20 MG tablet Commonly known as: Pravachol Take 1 tablet (20 mg total) by mouth daily.   sacubitril-valsartan 24-26 MG Commonly known as: ENTRESTO Take 1 tablet by mouth 2 (two) times daily. Start taking on: April 16, 2019   zolpidem 5 MG tablet Commonly known as: AMBIEN Take 0.5-1 tablets (2.5-5 mg total) by mouth at bedtime as needed for sleep (insomnia).             Durable Medical Equipment  (From admission, onward)         Start     Ordered   04/12/19 1613  For home use only DME 4 wheeled rolling walker with seat  Once    Question Answer Comment  Patient needs a walker to treat with the following condition Age-related physical debility   Patient needs a walker to treat with the following condition Debility      04/12/19 1612         Follow-up Information    Roby Lofts M., PA-C Follow up on 04/26/2019.   Specialty: Physician Assistant Why: Please arrive 15 minutes early for your 11:00am post-hospital cardiology appointment Contact information: [address] [place] [address] [phone]        Cassandria Anger, MD On 04/20/2019.   Specialty: Internal Medicine Why: Wednesday 04/20/2019 @ 2:40 pm the office will call the day before to do a Covid screening and go over the new check in process. Contact information: [address]  [place] [address] [phone]        [facility] Follow up.   Why: rollator          Consultations:  Cardiology   Procedures/Studies:  2D echo  IMPRESSIONS    1. The left ventricle has severely reduced systolic function, with an ejection fraction of 20-25%. The cavity size was mildly dilated. Left ventricular diastolic function could not be evaluated secondary to atrial fibrillation. Elevated left atrial and  left ventricular end-diastolic pressures Left ventricular diffuse hypokinesis.  2. There is akinesis of the basal and mid infero and anteroseptum, apical septum, apical anterior and apical inferolaterl akinesis.  3. The right ventricle has moderately reduced systolic function. The cavity was severely enlarged. There is no increase in right ventricular wall thickness. Right ventricular systolic pressure is moderately elevated at 13mmhg.  4. Left atrial size was severely dilated.  5. There is a 2.3 x 1.43cm mass in the posterior LA of uncler significance.  Cannot rule out thrombus. Consider TEE if clinically indicated.  6. Right atrial size was severely dilated.  7. Mild thickening of the mitral valve leaflet. Mitral valve regurgitation is mild to moderate by color flow Doppler.  8. The aortic valve is tricuspid. Moderate sclerosis of the aortic valve. Aortic valve regurgitation was not assessed by color flow Doppler. Moderate aortic annular calcification noted.  9. Tricuspid valve regurgitation is moderate. 10. The aorta is normal in size and structure. 11. The inferior vena cava was dilated in size with <50% respiratory variability.   Ct Angio Chest Pe W Or Wo Contrast  Result Date: 04/10/2019 CLINICAL DATA:  85 year old female with shortness of breath and chest pain. Generalized weakness. Concern for pulmonary embolism. EXAM: CT ANGIOGRAPHY CHEST CT ABDOMEN AND PELVIS WITH CONTRAST TECHNIQUE: Multidetector CT imaging of the chest was performed using the standard protocol during bolus administration of intravenous contrast. Multiplanar CT image reconstructions and MIPs were obtained to evaluate the vascular anatomy. Multidetector CT imaging of the abdomen and pelvis was performed using the standard protocol during bolus administration of intravenous contrast. CONTRAST:  153mL OMNIPAQUE IOHEXOL 350 MG/ML SOLN COMPARISON:  CT of the abdomen pelvis dated 11/07/2013 and chest radiograph dated 04/09/2019. FINDINGS: CTA CHEST FINDINGS Cardiovascular: There is moderate cardiomegaly. No pericardial effusion. There is retrograde flow of contrast from the right atrium into the IVC consistent with right heart dysfunction. There is moderate atherosclerotic calcification of the thoracic aorta. Evaluation of the aorta is limited due to non opacification and timing of the contrast. There is coronary vascular calcification involving the LAD, RCA, and left circumflex artery. There is no CT evidence of pulmonary embolism. Mediastinum/Nodes: No hilar or mediastinal  adenopathy. Esophagus is grossly unremarkable. No mediastinal fluid collection. Lungs/Pleura: There is diffuse interstitial and interlobular septal prominence consistent with edema. Confluent hazy airspace density in the region of the right hilum in the right lower lobe may represent edema or developing infiltrate. Clinical correlation and follow-up recommended. There is a small right pleural effusion. No pneumothorax. There is thickening of the bronchial wall which may represent bronchitis. Musculoskeletal: Degenerative changes of the spine. No acute osseous pathology. Surgical clips noted in the region of the axilla bilaterally. Bilateral mastectomy. Partially visualized lipoma superficial to the right infraspinatus muscle measuring up to 4.5 cm. Review of the MIP images confirms the above findings. CT ABDOMEN and PELVIS FINDINGS There is no intra-abdominal free air or free fluid. Hepatobiliary: Apparent fatty infiltration of the liver. No intrahepatic biliary ductal dilatation. No calcified gallstone  or pericholecystic fluid. Pancreas: Unremarkable. No pancreatic ductal dilatation or surrounding inflammatory changes. Spleen: Normal in size without focal abnormality. Adrenals/Urinary Tract: Bilateral adrenal nodules, measuring up to 2.1 cm on the left, similar to prior CT. This nodules are not characterized but relative stability over the course of 5 years indicate a benign etiology, likely adenoma. There is no hydronephrosis on either side. There is symmetric enhancement and excretion of contrast by both kidneys. The visualized ureters and urinary bladder appear unremarkable. Stomach/Bowel: There is colonic diverticulosis without active inflammatory changes. There is no bowel obstruction. The appendix is not visualized with certainty. No inflammatory changes identified in the right lower quadrant. Vascular/Lymphatic: Advanced aortoiliac atherosclerotic disease. No portal venous gas. There is no adenopathy.  Reproductive: Hysterectomy. The ovaries are grossly unremarkable. No adnexal masses. Other: Midline vertical anterior abdominal wall incisional scar. Musculoskeletal: Degenerative changes of the spine. No acute osseous pathology. Review of the MIP images confirms the above findings. IMPRESSION: 1. No CT evidence of pulmonary embolism or aortic dissection. 2. Cardiomegaly with evidence of right heart dysfunction. 3. Small right pleural effusion with findings of CHF. Confluent airspace density in the right lower lobe may represent edema or developing infiltrate. Clinical correlation and follow-up recommended. 4. Colonic diverticulosis. No bowel obstruction or active inflammation. 5. Aortic Atherosclerosis (ICD10-I70.0). Electronically Signed   By: Anner Crete M.D.   On: 04/10/2019 02:38   Ct Abdomen Pelvis W Contrast  Result Date: 04/10/2019 CLINICAL DATA:  85 year old female with shortness of breath and chest pain. Generalized weakness. Concern for pulmonary embolism. EXAM: CT ANGIOGRAPHY CHEST CT ABDOMEN AND PELVIS WITH CONTRAST TECHNIQUE: Multidetector CT imaging of the chest was performed using the standard protocol during bolus administration of intravenous contrast. Multiplanar CT image reconstructions and MIPs were obtained to evaluate the vascular anatomy. Multidetector CT imaging of the abdomen and pelvis was performed using the standard protocol during bolus administration of intravenous contrast. CONTRAST:  155mL OMNIPAQUE IOHEXOL 350 MG/ML SOLN COMPARISON:  CT of the abdomen pelvis dated 11/07/2013 and chest radiograph dated 04/09/2019. FINDINGS: CTA CHEST FINDINGS Cardiovascular: There is moderate cardiomegaly. No pericardial effusion. There is retrograde flow of contrast from the right atrium into the IVC consistent with right heart dysfunction. There is moderate atherosclerotic calcification of the thoracic aorta. Evaluation of the aorta is limited due to non opacification and timing of the  contrast. There is coronary vascular calcification involving the LAD, RCA, and left circumflex artery. There is no CT evidence of pulmonary embolism. Mediastinum/Nodes: No hilar or mediastinal adenopathy. Esophagus is grossly unremarkable. No mediastinal fluid collection. Lungs/Pleura: There is diffuse interstitial and interlobular septal prominence consistent with edema. Confluent hazy airspace density in the region of the right hilum in the right lower lobe may represent edema or developing infiltrate. Clinical correlation and follow-up recommended. There is a small right pleural effusion. No pneumothorax. There is thickening of the bronchial wall which may represent bronchitis. Musculoskeletal: Degenerative changes of the spine. No acute osseous pathology. Surgical clips noted in the region of the axilla bilaterally. Bilateral mastectomy. Partially visualized lipoma superficial to the right infraspinatus muscle measuring up to 4.5 cm. Review of the MIP images confirms the above findings. CT ABDOMEN and PELVIS FINDINGS There is no intra-abdominal free air or free fluid. Hepatobiliary: Apparent fatty infiltration of the liver. No intrahepatic biliary ductal dilatation. No calcified gallstone or pericholecystic fluid. Pancreas: Unremarkable. No pancreatic ductal dilatation or surrounding inflammatory changes. Spleen: Normal in size without focal abnormality. Adrenals/Urinary Tract: Bilateral adrenal nodules, measuring  up to 2.1 cm on the left, similar to prior CT. This nodules are not characterized but relative stability over the course of 5 years indicate a benign etiology, likely adenoma. There is no hydronephrosis on either side. There is symmetric enhancement and excretion of contrast by both kidneys. The visualized ureters and urinary bladder appear unremarkable. Stomach/Bowel: There is colonic diverticulosis without active inflammatory changes. There is no bowel obstruction. The appendix is not visualized with  certainty. No inflammatory changes identified in the right lower quadrant. Vascular/Lymphatic: Advanced aortoiliac atherosclerotic disease. No portal venous gas. There is no adenopathy. Reproductive: Hysterectomy. The ovaries are grossly unremarkable. No adnexal masses. Other: Midline vertical anterior abdominal wall incisional scar. Musculoskeletal: Degenerative changes of the spine. No acute osseous pathology. Review of the MIP images confirms the above findings. IMPRESSION: 1. No CT evidence of pulmonary embolism or aortic dissection. 2. Cardiomegaly with evidence of right heart dysfunction. 3. Small right pleural effusion with findings of CHF. Confluent airspace density in the right lower lobe may represent edema or developing infiltrate. Clinical correlation and follow-up recommended. 4. Colonic diverticulosis. No bowel obstruction or active inflammation. 5. Aortic Atherosclerosis (ICD10-I70.0). Electronically Signed   By: Anner Crete M.D.   On: 04/10/2019 02:38   Dg Chest Portable 1 View  Result Date: 04/09/2019 CLINICAL DATA:  Generalized weakness.  Shortness of breath. EXAM: PORTABLE CHEST 1 VIEW COMPARISON:  May 23, 2014 FINDINGS: Stable cardiomegaly. The hila and mediastinum are normal. No pneumothorax. No pulmonary nodules or masses. No focal infiltrates. No overt edema. Mild pulmonary venous congestion not excluded. IMPRESSION: Cardiomegaly. Possible mild pulmonary venous congestion. No other abnormalities. Electronically Signed   By: Dorise Bullion III M.D   On: 04/09/2019 18:25   Vas [place] Lower Extremity Venous (dvt)  Result Date: 04/10/2019  Lower Venous Study Indications: Elevated D-Dimer.  Comparison Study: No prior study available for comparison. Performing Technologist: Sharion Dove RVS  Examination Guidelines: A complete evaluation includes B-mode imaging, spectral Doppler, color Doppler, and power Doppler as needed of all accessible portions of each vessel. Bilateral testing is  considered an integral part of a complete examination. Limited examinations for reoccurring indications may be performed as noted.  +---------+---------------+---------+-----------+----------+-------+  RIGHT     Compressibility Phasicity Spontaneity Properties Summary  +---------+---------------+---------+-----------+----------+-------+  CFV       Full            Yes       Yes                             +---------+---------------+---------+-----------+----------+-------+  SFJ       Full                                                      +---------+---------------+---------+-----------+----------+-------+  FV Prox   Full                                                      +---------+---------------+---------+-----------+----------+-------+  FV Mid    Full                                                      +---------+---------------+---------+-----------+----------+-------+  FV Distal Full                                                      +---------+---------------+---------+-----------+----------+-------+  PFV       Full                                                      +---------+---------------+---------+-----------+----------+-------+  POP       Full            Yes       Yes                             +---------+---------------+---------+-----------+----------+-------+  PTV       Full                                                      +---------+---------------+---------+-----------+----------+-------+  PERO      Full                                                      +---------+---------------+---------+-----------+----------+-------+   +---------+---------------+---------+-----------+----------+-------+  LEFT      Compressibility Phasicity Spontaneity Properties Summary  +---------+---------------+---------+-----------+----------+-------+  CFV       Full            Yes       Yes                             +---------+---------------+---------+-----------+----------+-------+  SFJ       Full                                                       +---------+---------------+---------+-----------+----------+-------+  FV Prox   Full                                                      +---------+---------------+---------+-----------+----------+-------+  FV Mid    Full                                                      +---------+---------------+---------+-----------+----------+-------+  FV Distal Full                                                      +---------+---------------+---------+-----------+----------+-------+  PFV       Full                                                      +---------+---------------+---------+-----------+----------+-------+  POP       Full            Yes       Yes                             +---------+---------------+---------+-----------+----------+-------+  PTV       Full                                                      +---------+---------------+---------+-----------+----------+-------+  PERO      Full                                                      +---------+---------------+---------+-----------+----------+-------+     Summary: Right: There is no evidence of deep vein thrombosis in the lower extremity. Left: There is no evidence of deep vein thrombosis in the lower extremity.  *See table(s) above for measurements and observations. Electronically signed by Harold Barban MD on 04/10/2019 at 12:36:25 PM.    Final       Subjective: - no chest pain, shortness of breath, no abdominal pain, nausea or vomiting.   Discharge Exam: BP 99/67 (BP Location: Right Arm)    Pulse 72    Temp 97.6 F (36.4 C) (Oral)    Resp 15    Ht 5\' 6"  (1.676 m)    Wt 80.7 kg    SpO2 100%    BMI 28.71 kg/m   General: Pt is alert, awake, not in acute distress Cardiovascular: irregular Respiratory: CTA bilaterally, no wheezing, no rhonchi Abdominal: Soft, NT, ND, bowel sounds + Extremities: no edema, no cyanosis    The results of significant diagnostics from this hospitalization  (including imaging, microbiology, ancillary and laboratory) are listed below for reference.     Microbiology: Recent Results (from the past 240 hour(s))  SARS Coronavirus 2 [facility] order, Performed in [facility] hospital lab) Nasopharyngeal Nasopharyngeal Swab     Status: None   Collection Time: 04/09/19  9:35 PM   Specimen: Nasopharyngeal Swab  Result Value Ref Range Status   SARS Coronavirus 2 NEGATIVE NEGATIVE Final    Comment: (NOTE) If result is NEGATIVE SARS-CoV-2 target nucleic acids are NOT DETECTED. The SARS-CoV-2 RNA is generally detectable in upper and lower  respiratory specimens during the acute phase of infection. The lowest  concentration of SARS-CoV-2 viral copies this assay can detect is 250  copies / mL. A negative result does not preclude SARS-CoV-2 infection  and should not be used as the sole basis for treatment or other  patient management decisions.  A negative result may occur with  improper specimen collection / handling, submission of specimen other  than nasopharyngeal swab, presence of viral mutation(s) within the  areas targeted by this assay, and  inadequate number of viral copies  (<250 copies / mL). A negative result must be combined with clinical  observations, patient history, and epidemiological information. If result is POSITIVE SARS-CoV-2 target nucleic acids are DETECTED. The SARS-CoV-2 RNA is generally detectable in upper and lower  respiratory specimens dur ing the acute phase of infection.  Positive  results are indicative of active infection with SARS-CoV-2.  Clinical  correlation with patient history and other diagnostic information is  necessary to determine patient infection status.  Positive results do  not rule out bacterial infection or co-infection with other viruses. If result is PRESUMPTIVE POSTIVE SARS-CoV-2 nucleic acids MAY BE PRESENT.   A presumptive positive result was obtained on the submitted specimen  and confirmed on  repeat testing.  While 2019 novel coronavirus  (SARS-CoV-2) nucleic acids may be present in the submitted sample  additional confirmatory testing may be necessary for epidemiological  and / or clinical management purposes  to differentiate between  SARS-CoV-2 and other Sarbecovirus currently known to infect humans.  If clinically indicated additional testing with an alternate test  methodology [phone]) is advised. The SARS-CoV-2 RNA is generally  detectable in upper and lower respiratory sp ecimens during the acute  phase of infection. The expected result is Negative. Fact Sheet for Patients:  StrictlyIdeas.no Fact Sheet for Healthcare Providers: BankingDealers.co.za This test is not yet approved or cleared by the [place] FDA and has been authorized for detection and/or diagnosis of SARS-CoV-2 by FDA under an Emergency Use Authorization (EUA).  This EUA will remain in effect (meaning this test can be used) for the duration of the COVID-19 declaration under Section 564(b)(1) of the Act, 21 U.S.C. section 360bbb-3(b)(1), unless the authorization is terminated or revoked sooner. Performed at [facility], [place] [address], [place], [place] [address]      Labs: BNP (last 3 results) Recent Labs    04/09/19 2201  BNP 3,295.1*   Basic Metabolic Panel: Recent Labs  Lab 04/11/19 0620 04/12/19 0413 04/13/19 0459 04/14/19 0620 04/15/19 0543  NA 141 138 137 138 139  K 3.4* 3.5 3.7 3.6 3.5  CL 98 95* 97* 95* 100  CO2 32 31 30 31 29   GLUCOSE 91 86 100* 93 95  BUN 17 18 23 23 23   CREATININE 1.28* 1.20* 1.41* 1.48* 1.29*  CALCIUM 9.2 9.1 9.0 9.1 9.2  MG 1.6* 2.1  --   --   --    Liver Function Tests: Recent Labs  Lab 04/09/19 1738  AST 33  ALT 28  ALKPHOS 61  BILITOT 0.6  PROT 7.0  ALBUMIN 3.7   No results for input(s): LIPASE, AMYLASE in the last 168 hours. No results for input(s): AMMONIA in the last 168  hours. CBC: Recent Labs  Lab 04/09/19 1738 04/11/19 0620  WBC 5.4 4.7  HGB 12.8 11.9*  HCT 39.2 36.7  MCV 86.0 85.3  PLT 211 185   Cardiac Enzymes: No results for input(s): CKTOTAL, CKMB, CKMBINDEX, TROPONINI in the last 168 hours. BNP: Invalid input(s): POCBNP CBG: No results for input(s): GLUCAP in the last 168 hours. D-Dimer No results for input(s): DDIMER in the last 72 hours. Hgb A1c No results for input(s): HGBA1C in the last 72 hours. Lipid Profile No results for input(s): CHOL, HDL, LDLCALC, TRIG, CHOLHDL, LDLDIRECT in the last 72 hours. Thyroid function studies No results for input(s): TSH, T4TOTAL, T3FREE, THYROIDAB in the last 72 hours.  Invalid input(s): FREET3 Anemia work up No results for input(s):  VITAMINB12, FOLATE, FERRITIN, TIBC, IRON, RETICCTPCT in the last 72 hours. Urinalysis    Component Value Date/Time   COLORURINE YELLOW 04/09/2019 1852   APPEARANCEUR CLEAR 04/09/2019 1852   LABSPEC 1.006 04/09/2019 1852   PHURINE 5.0 04/09/2019 1852   GLUCOSEU NEGATIVE 04/09/2019 1852   GLUCOSEU NEGATIVE 07/28/2018 1620   HGBUR MODERATE (A) 04/09/2019 1852   BILIRUBINUR NEGATIVE 04/09/2019 1852   BILIRUBINUR neg 09/24/2015 1055   KETONESUR NEGATIVE 04/09/2019 1852   PROTEINUR NEGATIVE 04/09/2019 1852   UROBILINOGEN 1.0 07/28/2018 1620   NITRITE NEGATIVE 04/09/2019 1852   LEUKOCYTESUR NEGATIVE 04/09/2019 1852   Sepsis Labs Invalid input(s): PROCALCITONIN,  WBC,  LACTICIDVEN  FURTHER DISCHARGE INSTRUCTIONS:   Get Medicines reviewed and adjusted: Please take all your medications with you for your next visit with your Primary MD   Laboratory/radiological data: Please request your Primary MD to go over all hospital tests and procedure/radiological results at the follow up, please ask your Primary MD to get all Hospital records sent to his/her office.   In some cases, they will be blood work, cultures and biopsy results pending at the time of your  discharge. Please request that your primary care M.D. goes through all the records of your hospital data and follows up on these results.   Also Note the following: If you experience worsening of your admission symptoms, develop shortness of breath, life threatening emergency, suicidal or homicidal thoughts you must seek medical attention immediately by calling 911 or calling your MD immediately  if symptoms less severe.   You must read complete instructions/literature along with all the possible adverse reactions/side effects for all the Medicines you take and that have been prescribed to you. Take any new Medicines after you have completely understood and accpet all the possible adverse reactions/side effects.    Do not drive when taking Pain medications or sleeping medications (Benzodaizepines)   Do not take more than prescribed Pain, Sleep and Anxiety Medications. It is not advisable to combine anxiety,sleep and pain medications without talking with your primary care practitioner   Special Instructions: If you have smoked or chewed Tobacco  in the last 2 yrs please stop smoking, stop any regular Alcohol  and or any Recreational drug use.   Wear Seat belts while driving.   Please note: You were cared for by a hospitalist during your hospital stay. Once you are discharged, your primary care physician will handle any further medical issues. Please note that NO REFILLS for any discharge medications will be authorized once you are discharged, as it is imperative that you return to your primary care physician (or establish a relationship with a primary care physician if you do not have one) for your post hospital discharge needs so that they can reassess your need for medications and monitor your lab values.  Time coordinating discharge: 35 minutes  SIGNED:  Marzetta Board, MD, PhD 04/15/2019, 2:51 PM

## 2019-04-15 NOTE — Progress Notes (Signed)
Discharge instructions given to patient and her son, all questions answered and concerns addressed. Patient is waiting for her ride to get back so he can take her home.

## 2019-04-18 ENCOUNTER — Emergency Department (HOSPITAL_COMMUNITY): Payer: Medicare Other

## 2019-04-18 ENCOUNTER — Telehealth: Payer: Self-pay | Admitting: *Deleted

## 2019-04-18 ENCOUNTER — Inpatient Hospital Stay (HOSPITAL_COMMUNITY)
Admission: EM | Admit: 2019-04-18 | Discharge: 2019-04-21 | DRG: 291 | Disposition: A | Discharge status: Home Health | Payer: Medicare Other | Attending: Internal Medicine | Admitting: Internal Medicine

## 2019-04-18 ENCOUNTER — Other Ambulatory Visit: Payer: Self-pay

## 2019-04-18 ENCOUNTER — Encounter (HOSPITAL_COMMUNITY): Payer: Self-pay

## 2019-04-18 DIAGNOSIS — Z85828 Personal history of other malignant neoplasm of skin: Secondary | ICD-10-CM

## 2019-04-18 DIAGNOSIS — I5023 Acute on chronic systolic (congestive) heart failure: Secondary | ICD-10-CM | POA: Diagnosis not present

## 2019-04-18 DIAGNOSIS — G8929 Other chronic pain: Secondary | ICD-10-CM | POA: Diagnosis not present

## 2019-04-18 DIAGNOSIS — I491 Atrial premature depolarization: Secondary | ICD-10-CM | POA: Diagnosis not present

## 2019-04-18 DIAGNOSIS — N183 Chronic kidney disease, stage 3 (moderate): Secondary | ICD-10-CM | POA: Diagnosis not present

## 2019-04-18 DIAGNOSIS — I2510 Atherosclerotic heart disease of native coronary artery without angina pectoris: Secondary | ICD-10-CM | POA: Diagnosis not present

## 2019-04-18 DIAGNOSIS — N179 Acute kidney failure, unspecified: Secondary | ICD-10-CM | POA: Diagnosis present

## 2019-04-18 DIAGNOSIS — Z8249 Family history of ischemic heart disease and other diseases of the circulatory system: Secondary | ICD-10-CM

## 2019-04-18 DIAGNOSIS — Z886 Allergy status to analgesic agent status: Secondary | ICD-10-CM | POA: Diagnosis not present

## 2019-04-18 DIAGNOSIS — Z881 Allergy status to other antibiotic agents status: Secondary | ICD-10-CM | POA: Diagnosis not present

## 2019-04-18 DIAGNOSIS — Z809 Family history of malignant neoplasm, unspecified: Secondary | ICD-10-CM | POA: Diagnosis not present

## 2019-04-18 DIAGNOSIS — I130 Hypertensive heart and chronic kidney disease with heart failure and stage 1 through stage 4 chronic kidney disease, or unspecified chronic kidney disease: Principal | ICD-10-CM | POA: Diagnosis present

## 2019-04-18 DIAGNOSIS — I10 Essential (primary) hypertension: Secondary | ICD-10-CM | POA: Diagnosis present

## 2019-04-18 DIAGNOSIS — Z20828 Contact with and (suspected) exposure to other viral communicable diseases: Secondary | ICD-10-CM | POA: Diagnosis present

## 2019-04-18 DIAGNOSIS — Z9013 Acquired absence of bilateral breasts and nipples: Secondary | ICD-10-CM

## 2019-04-18 DIAGNOSIS — I4821 Permanent atrial fibrillation: Secondary | ICD-10-CM | POA: Diagnosis present

## 2019-04-18 DIAGNOSIS — Z7901 Long term (current) use of anticoagulants: Secondary | ICD-10-CM | POA: Diagnosis not present

## 2019-04-18 DIAGNOSIS — R079 Chest pain, unspecified: Secondary | ICD-10-CM | POA: Diagnosis present

## 2019-04-18 DIAGNOSIS — E785 Hyperlipidemia, unspecified: Secondary | ICD-10-CM | POA: Diagnosis not present

## 2019-04-18 DIAGNOSIS — R0602 Shortness of breath: Secondary | ICD-10-CM | POA: Diagnosis not present

## 2019-04-18 DIAGNOSIS — K5730 Diverticulosis of large intestine without perforation or abscess without bleeding: Secondary | ICD-10-CM | POA: Diagnosis present

## 2019-04-18 DIAGNOSIS — K219 Gastro-esophageal reflux disease without esophagitis: Secondary | ICD-10-CM | POA: Diagnosis not present

## 2019-04-18 DIAGNOSIS — Z853 Personal history of malignant neoplasm of breast: Secondary | ICD-10-CM

## 2019-04-18 DIAGNOSIS — Z90710 Acquired absence of both cervix and uterus: Secondary | ICD-10-CM

## 2019-04-18 DIAGNOSIS — Z888 Allergy status to other drugs, medicaments and biological substances status: Secondary | ICD-10-CM

## 2019-04-18 DIAGNOSIS — Z96651 Presence of right artificial knee joint: Secondary | ICD-10-CM | POA: Diagnosis present

## 2019-04-18 DIAGNOSIS — I499 Cardiac arrhythmia, unspecified: Secondary | ICD-10-CM | POA: Diagnosis not present

## 2019-04-18 DIAGNOSIS — I4891 Unspecified atrial fibrillation: Secondary | ICD-10-CM | POA: Diagnosis not present

## 2019-04-18 DIAGNOSIS — I509 Heart failure, unspecified: Secondary | ICD-10-CM

## 2019-04-18 DIAGNOSIS — N189 Chronic kidney disease, unspecified: Secondary | ICD-10-CM | POA: Diagnosis not present

## 2019-04-18 DIAGNOSIS — I5189 Other ill-defined heart diseases: Secondary | ICD-10-CM | POA: Diagnosis not present

## 2019-04-18 DIAGNOSIS — Z79899 Other long term (current) drug therapy: Secondary | ICD-10-CM | POA: Diagnosis not present

## 2019-04-18 DIAGNOSIS — I5043 Acute on chronic combined systolic (congestive) and diastolic (congestive) heart failure: Secondary | ICD-10-CM | POA: Diagnosis not present

## 2019-04-18 LAB — CBC
HCT: 43.6 % (ref 36.0–46.0)
Hemoglobin: 14 g/dL (ref 12.0–15.0)
MCH: 27.4 pg (ref 26.0–34.0)
MCHC: 32.1 g/dL (ref 30.0–36.0)
MCV: 85.3 fL (ref 80.0–100.0)
Platelets: 226 10*3/uL (ref 150–400)
RBC: 5.11 MIL/uL (ref 3.87–5.11)
RDW: 15.1 % (ref 11.5–15.5)
WBC: 4.7 10*3/uL (ref 4.0–10.5)
nRBC: 0 % (ref 0.0–0.2)

## 2019-04-18 LAB — SARS CORONAVIRUS 2 BY RT PCR (HOSPITAL ORDER, PERFORMED IN ~~LOC~~ HOSPITAL LAB): SARS Coronavirus 2: NEGATIVE

## 2019-04-18 LAB — BASIC METABOLIC PANEL
Anion gap: 11 (ref 5–15)
BUN: 19 mg/dL (ref 8–23)
CO2: 26 mmol/L (ref 22–32)
Calcium: 9.5 mg/dL (ref 8.9–10.3)
Chloride: 100 mmol/L (ref 98–111)
Creatinine, Ser: 1.56 mg/dL — ABNORMAL HIGH (ref 0.44–1.00)
GFR calc Af Amer: 35 mL/min — ABNORMAL LOW (ref >60)
GFR calc non Af Amer: 30 mL/min — ABNORMAL LOW (ref >60)
Glucose, Bld: 109 mg/dL — ABNORMAL HIGH (ref 70–99)
Potassium: 3.7 mmol/L (ref 3.5–5.1)
Sodium: 137 mmol/L (ref 135–145)

## 2019-04-18 LAB — BRAIN NATRIURETIC PEPTIDE: B Natriuretic Peptide: 1606.2 pg/mL — ABNORMAL HIGH (ref 0.0–100.0)

## 2019-04-18 LAB — TROPONIN I (HIGH SENSITIVITY)
Troponin I (High Sensitivity): 16 ng/L (ref <18)
Troponin I (High Sensitivity): 18 ng/L — ABNORMAL HIGH (ref <18)

## 2019-04-18 LAB — MAGNESIUM: Magnesium: 1.9 mg/dL (ref 1.7–2.4)

## 2019-04-18 MED ORDER — DIPHENHYDRAMINE HCL 25 MG PO CAPS
25.0000 mg | ORAL_CAPSULE | Freq: Every day | ORAL | Status: DC
  Administered 2019-04-18 – 2019-04-20 (×3): 25 mg via ORAL
  Filled 2019-04-18 (×3): qty 1

## 2019-04-18 MED ORDER — APIXABAN 5 MG PO TABS
5.0000 mg | ORAL_TABLET | Freq: Two times a day (BID) | ORAL | Status: DC
  Administered 2019-04-18 – 2019-04-19 (×3): 5 mg via ORAL
  Filled 2019-04-18 (×4): qty 1

## 2019-04-18 MED ORDER — DIPHENHYDRAMINE HCL (SLEEP) 25 MG PO TABS: 25.0000 mg | ORAL_TABLET | Freq: Every day | ORAL | Status: DC

## 2019-04-18 MED ORDER — SODIUM CHLORIDE 0.9 % IV SOLN: 250.0000 mL | INTRAVENOUS | Status: DC | PRN

## 2019-04-18 MED ORDER — ZOLPIDEM TARTRATE 5 MG PO TABS
2.5000 mg | ORAL_TABLET | Freq: Every evening | ORAL | Status: DC | PRN
  Administered 2019-04-19 – 2019-04-21 (×2): 5 mg via ORAL
  Filled 2019-04-18 (×2): qty 1

## 2019-04-18 MED ORDER — PANTOPRAZOLE SODIUM 40 MG PO TBEC
40.0000 mg | DELAYED_RELEASE_TABLET | Freq: Two times a day (BID) | ORAL | Status: DC
  Administered 2019-04-18 – 2019-04-21 (×7): 40 mg via ORAL
  Filled 2019-04-18 (×7): qty 1

## 2019-04-18 MED ORDER — SODIUM CHLORIDE 0.9% FLUSH
3.0000 mL | Freq: Two times a day (BID) | INTRAVENOUS | Status: DC
  Administered 2019-04-18 – 2019-04-21 (×6): 3 mL via INTRAVENOUS

## 2019-04-18 MED ORDER — ALBUTEROL SULFATE (2.5 MG/3ML) 0.083% IN NEBU: 2.5000 mg | INHALATION_SOLUTION | Freq: Four times a day (QID) | RESPIRATORY_TRACT | Status: DC | PRN

## 2019-04-18 MED ORDER — ONDANSETRON HCL 4 MG/2ML IJ SOLN: 4.0000 mg | Freq: Four times a day (QID) | INTRAMUSCULAR | Status: DC | PRN

## 2019-04-18 MED ORDER — SODIUM CHLORIDE 0.9% FLUSH
3.0000 mL | Freq: Once | INTRAVENOUS | Status: AC
  Administered 2019-04-18: 3 mL via INTRAVENOUS

## 2019-04-18 MED ORDER — SACUBITRIL-VALSARTAN 24-26 MG PO TABS
1.0000 | ORAL_TABLET | Freq: Two times a day (BID) | ORAL | Status: DC
  Filled 2019-04-18 (×2): qty 1

## 2019-04-18 MED ORDER — SODIUM CHLORIDE 0.9% FLUSH: 3.0000 mL | INTRAVENOUS | Status: DC | PRN

## 2019-04-18 MED ORDER — METOPROLOL TARTRATE 12.5 MG HALF TABLET
12.5000 mg | ORAL_TABLET | Freq: Two times a day (BID) | ORAL | Status: DC
  Administered 2019-04-18 – 2019-04-20 (×4): 12.5 mg via ORAL
  Filled 2019-04-18 (×4): qty 1

## 2019-04-18 MED ORDER — FUROSEMIDE 10 MG/ML IJ SOLN
40.0000 mg | Freq: Once | INTRAMUSCULAR | Status: AC
  Administered 2019-04-18: 08:00:00 40 mg via INTRAVENOUS
  Filled 2019-04-18: qty 4

## 2019-04-18 MED ORDER — FUROSEMIDE 10 MG/ML IJ SOLN
40.0000 mg | Freq: Two times a day (BID) | INTRAMUSCULAR | Status: DC
  Administered 2019-04-18 – 2019-04-19 (×2): 40 mg via INTRAVENOUS
  Filled 2019-04-18 (×2): qty 4

## 2019-04-18 MED ORDER — PRAVASTATIN SODIUM 10 MG PO TABS
20.0000 mg | ORAL_TABLET | Freq: Every day | ORAL | Status: DC
  Administered 2019-04-18 – 2019-04-20 (×3): 20 mg via ORAL
  Filled 2019-04-18 (×3): qty 2

## 2019-04-18 MED ORDER — POTASSIUM CHLORIDE CRYS ER 20 MEQ PO TBCR
20.0000 meq | EXTENDED_RELEASE_TABLET | Freq: Two times a day (BID) | ORAL | Status: DC
  Administered 2019-04-18 – 2019-04-21 (×7): 20 meq via ORAL
  Filled 2019-04-18 (×7): qty 1

## 2019-04-18 MED ORDER — ACETAMINOPHEN 325 MG PO TABS
650.0000 mg | ORAL_TABLET | ORAL | Status: DC | PRN
  Administered 2019-04-18 – 2019-04-20 (×4): 650 mg via ORAL
  Filled 2019-04-18 (×4): qty 2

## 2019-04-18 NOTE — Telephone Encounter (Signed)
Patient is currently admitted

## 2019-04-18 NOTE — ED Notes (Signed)
Lunch tray ordered 

## 2019-04-18 NOTE — ED Provider Notes (Signed)
Patient is awake, alert, aware of all findings thus far, including elevated BNP beyond that on discharge.  Given concern for worsening heart failure she has received IV Lasix, and will be admitted for further monitoring, management.   Carmin Muskrat, MD 04/18/19 [phone]

## 2019-04-18 NOTE — ED Provider Notes (Signed)
[facility] EMERGENCY DEPARTMENT Provider Note   CSN: [id number] Arrival date & time: 04/18/19  0550     History   Chief Complaint Chief Complaint  Patient presents with  . Chest Pain    HPI [name] [name] is a 85 y.o. female.      Chest Pain Pain location:  Substernal area Pain quality: pressure   Pain radiates to:  Does not radiate Pain severity:  Mild Onset quality:  Gradual Duration:  1 day Timing:  Constant Chronicity:  Recurrent Context: breathing   Relieved by:  None tried Worsened by:  Nothing Ineffective treatments:  None tried   Past Medical History:  Diagnosis Date  . Acute lymphadenitis of arm   . Anemia    iron deficiency  . Atrial fibrillation ([place])   . Breast cancer ([place])   . Depression 2009   grief  . Diverticulosis of colon 2004   Dr Deatra Ina  . GERD (gastroesophageal reflux disease)   . Heart murmur   . Helicobacter pylori gastritis   . Hematuria    Dr Terance Hart  . Hyperlipidemia   . Hypertension   . Osteoarthritis, knee   . Skin cancer   . Vitamin B12 deficiency   . Vitamin D deficiency     Patient Active Problem List   Diagnosis Date Noted  . Abnormal echocardiogram   . Unintentional weight loss 04/10/2019  . Chest pain 04/09/2019  . Elevated troponin 04/09/2019  . CKD (chronic kidney disease), stage III ([place]) 04/09/2019  . Acute on chronic systolic CHF (congestive heart failure) ([place]) 03/08/2019  . Shortness of breath 02/28/2019  . Edema of both lower extremities 02/28/2019  . Dysphagia 09/28/2018  . Anosmia 08/18/2018  . Sweats, menopausal 08/18/2018  . Urinary incontinence 08/18/2018  . UTI (urinary tract infection) due to Enterococcus 08/06/2018  . Chest discomfort 08/04/2018  . Murmur, cardiac 07/28/2018  . Permanent atrial fibrillation:  CHA2DS2-VASc Score ([place]) 07/28/2018  . Sciatica associated with disorder of lumbar spine 02/26/2018  . Hip pain, chronic, left 02/26/2018  . Acute  pharyngitis 07/13/2017  . Actinic keratosis 07/13/2017  . Wart viral 07/01/2017  . Edema 12/19/2016  . Right leg pain 11/03/2016  . Knee pain, left 07/17/2016  . Bell palsy 10/22/2015  . Taste sense altered 10/22/2015  . Mouth droop due to facial weakness 10/12/2015  . Sialoadenitis of submandibular gland 10/04/2015  . Fatigue 10/04/2015  . Cold intolerance 04/23/2015  . Headache 12/29/2014  . Total knee replacement status 05/24/2014  . Lymphedema of upper extremity following lymphadenectomy 05/22/2014  . Preop exam for internal medicine 05/22/2014  . Breast cancer ([place]) 03/17/2014  . Chronic cholecystitis 11/15/2013  . Abnormal gallbladder x-ray 11/08/2013  . Abdominal tenderness of left lower quadrant 11/02/2013  . Diverticulitis of colon without hemorrhage 11/02/2013  . Hematuria 11/02/2013  . Hypokalemia 11/02/2013  . Obstructive chronic bronchitis with exacerbation ([place]) 09/05/2013  . Mass of right forearm 02/07/2013  . Well adult exam 02/04/2013  . Lipoma of forearm 02/04/2013  . Mass of left side of neck 01/06/2012  . Diverticulitis 10/10/2011  . Nausea & vomiting 04/16/2011  . Chills 04/16/2011  . LLQ abdominal pain 04/16/2011  . Abdominal pain, epigastric 08/14/2010  . GERD 06/27/2010  . SKIN CANCER, HX OF 01/31/2010  . CHILLS WITHOUT FEVER 05/04/2009  . Rash and nonspecific skin eruption 11/21/2008  . Herpes zoster 10/04/2008  . PARESTHESIA 10/04/2008  . GRIEF REACTION 08/15/2008  . Depression 08/15/2008  . Osteoarthritis 08/15/2008  .  HYPOKALEMIA 12/22/2007  . Weight loss 10/21/2007  . Pain in Soft Tissues of Limb 06/21/2007  . B12 deficiency 03/27/2007  . Vitamin D deficiency 03/27/2007  . Hyperlipidemia with target LDL less than 100 03/27/2007  . ANEMIA-IRON DEFICIENCY 03/27/2007  . Insomnia 03/27/2007  . Essential hypertension 03/27/2007  . BRONCHITIS, ACUTE 03/27/2007  . DIVERTICULOSIS, COLON 03/27/2007    Past Surgical History:  Procedure  Laterality Date  . ABDOMINAL HYSTERECTOMY    . CARDIOVERSION N/A 09/06/2018   Procedure: CARDIOVERSION;  Surgeon: Fay Records, MD;  [facility] ENDOSCOPY;  Service: Cardiovascular;  Laterality: N/A; --> after initial success with sinus rhythm, the patient went back into A. fib within 2 minutes.  . CORONARY CT ANGIOGRAM  09/14/2018   Coronary Calcium Score 276.  Mild mixed plaque in the left main with minimal stenosis.  Mixed plaque in the ostial LAD, proximal LCx and moderate size OM1, as well as proximal RCA (<50% stenosis throughout.).  Nonobstructive CAD.  Intermediate risk  . JOINT REPLACEMENT    . L groin skin cancer  2011  . MASTECTOMY  09-13-08   left and right  . TOTAL KNEE ARTHROPLASTY  06-04-98  . TOTAL KNEE ARTHROPLASTY Right 05/24/2014   Procedure: RIGHT TOTAL KNEE ARTHROPLASTY;  Surgeon: Newt Minion, MD;  Location: [facility];  Service: Orthopedics;  Laterality: Right;  . TRANSTHORACIC ECHOCARDIOGRAM  08/11/2018   Persistent A. fib: Mildly reduced EF of 45 to 50% but diffuse HK.  If A. fib the entire time.  Moderate LA and moderate to severe RA dilation (suggesting longstanding A. fib, and potentially explaining difficulty cardioversion).  No significant valve disease.  Aortic sclerosis no stenosis.  Unable to assess diastolic function with A. fib     OB History   No obstetric history on file.      Home Medications    Prior to Admission medications   Medication Sig Start Date End Date Taking? Authorizing Provider  acetaminophen (TYLENOL) 500 MG tablet Take 500 mg by mouth 2 (two) times daily as needed for moderate pain.     [provider]  [employer] B-12 500 MCG SUBL Place 2 tablets (1,000 mcg total) under the tongue daily. 04/21/18   Plotnikov, Evie Lacks, MD  diphenhydrAMINE (SOMINEX) 25 MG tablet Take 25 mg by mouth at bedtime.     [provider]  ELIQUIS 5 MG TABS tablet TAKE 1 TABLET BY MOUTH TWICE DAILY Patient taking differently: Take 5 mg by mouth 2 (two)  times daily.  10/26/18   Leonie Man, MD  loratadine (CLARITIN) 10 MG tablet Take 1 tablet (10 mg total) by mouth daily. Patient not taking: Reported on 04/09/2019 12/20/18   Plotnikov, Evie Lacks, MD  metoprolol tartrate (LOPRESSOR) 25 MG tablet Take 0.5 tablets (12.5 mg total) by mouth 2 (two) times daily. 04/15/19   Caren Griffins, MD  Multiple Vitamin (MULTIVITAMIN WITH MINERALS) TABS tablet Take 1 tablet by mouth daily.    [provider]  pantoprazole (PROTONIX) 40 MG tablet Take 1 tablet (40 mg total) by mouth 2 (two) times daily. 09/28/18   Plotnikov, Evie Lacks, MD  potassium chloride SA (KLOR-CON M20) 20 MEQ tablet Take 1 tablet (20 mEq total) by mouth 2 (two) times daily. 09/28/18   Plotnikov, Evie Lacks, MD  pravastatin (PRAVACHOL) 20 MG tablet Take 1 tablet (20 mg total) by mouth daily. Patient not taking: Reported on 04/09/2019 07/19/18 07/19/19  Plotnikov, Evie Lacks, MD  sacubitril-valsartan (ENTRESTO) 24-26 MG Take  1 tablet by mouth 2 (two) times daily. 04/16/19   Caren Griffins, MD  zolpidem (AMBIEN) 5 MG tablet Take 0.5-1 tablets (2.5-5 mg total) by mouth at bedtime as needed for sleep (insomnia). 03/31/19   Plotnikov, Evie Lacks, MD    Family History Family History  Problem Relation Age of Onset  . Cancer Sister        breast  . Cancer Maternal Aunt        breast  . Coronary artery disease Other   . Cancer Other        aunt, niece  . Atrial fibrillation Other     Social History Social History   Tobacco Use  . Smoking status: Never Smoker  . Smokeless tobacco: Never Used  Substance Use Topics  . Alcohol use: No  . Drug use: No     Allergies   Anastrozole, Aspirin, Ciprofloxacin, and Letrozole   Review of Systems Review of Systems  Cardiovascular: Positive for chest pain.  All other systems reviewed and are negative.    Physical Exam Updated Vital Signs BP 124/80   Pulse 69   Temp 97.9 F (36.6 C) (Oral)   Resp (!) 29   SpO2 95%    Physical Exam Vitals signs and nursing note reviewed.  Constitutional:      Appearance: She is well-developed.  HENT:     Head: Normocephalic and atraumatic.  Neck:     Musculoskeletal: Normal range of motion.  Cardiovascular:     Rate and Rhythm: Normal rate and regular rhythm.  Pulmonary:     Effort: Tachypnea present. No respiratory distress.     Breath sounds: No stridor. Decreased breath sounds (L>R) present.  Abdominal:     General: There is no distension.  Musculoskeletal: Normal range of motion.     Right lower leg: She exhibits no tenderness. No edema.     Left lower leg: She exhibits no tenderness. No edema.  Skin:    General: Skin is warm and dry.  Neurological:     General: No focal deficit present.     Mental Status: She is alert.  Psychiatric:        Mood and Affect: Mood normal.        Behavior: Behavior normal.      ED Treatments / Results  Labs (all labs ordered are listed, but only abnormal results are displayed) Labs Reviewed  CBC  BASIC METABOLIC PANEL  BRAIN NATRIURETIC PEPTIDE  TROPONIN I (HIGH SENSITIVITY)    EKG EKG Interpretation  Date/Time:  Monday April 18 2019 06:01:01 EDT Ventricular Rate:  48 PR Interval:    QRS Duration: 143 QT Interval:  456 QTC Calculation: 408 R Axis:   -69 Text Interpretation:  Atrial fibrillation Left bundle branch block No significant change since last tracing Confirmed by Merrily Pew [id number]) on 04/18/2019 6:03:47 AM   Radiology Dg Chest 2 View  Result Date: 04/18/2019 CLINICAL DATA:  Chest pain EXAM: CHEST - 2 VIEW COMPARISON:  CT from 8 days ago FINDINGS: Cardiomegaly without edema or effusion. No air bronchogram or pneumothorax. No acute osseous finding.  Postoperative left axilla. IMPRESSION: Cardiomegaly without failure. Electronically Signed   By: Monte Fantasia M.D.   On: 04/18/2019 06:34    Procedures Procedures (including critical care time)  Medications Ordered in ED Medications  sodium  chloride flush (NS) 0.9 % injection 3 mL (3 mLs Intravenous Given 04/18/19 0602)     Initial Impression / Assessment and Plan / ED  Course  I have reviewed the triage vital signs and the nursing notes.  Pertinent labs & imaging results that were available during my care of the patient were reviewed by me and considered in my medical decision making (see chart for details).       Suspect mild fluid overload, will eval for same.   Workup c/w same. Likely related to CP, care transferred pending reeval after diuresis for disposition.    Final Clinical Impressions(s) / ED Diagnoses   Final diagnoses:  None    ED Discharge Orders    None       Iyanla Eilers, Corene Cornea, MD 04/20/19 0030

## 2019-04-18 NOTE — Progress Notes (Signed)
Charge nurse called to advise CCMD called for 8 bt VT, pt had just eaten dinner and was sitting in bed, pt denies cp sob or palps, vss except bp a little soft, paged Dr Tamala Julian to advise.

## 2019-04-18 NOTE — Telephone Encounter (Signed)
Called patient and LVM to f/u after her d/c from the hospital. Patient does currently have Hospital f/u visit scheduled with PCP on 04/20/19. Nurse stated that she would try to reach out again to patient to inquire if she had any questions or concerns after her d/c and did leave her callback number for patient to call her back.

## 2019-04-18 NOTE — H&P (Addendum)
History and Physical    [name] UKG:[id number] DOB: 06/24/1933 DOA: 04/18/2019  Referring MD/NP/PA: Carmin Muskrat, MD PCP: Cassandria Anger, MD  Patient coming from: Home via EMS  Chief Complaint: Could not breathe  I have personally briefly reviewed patient's old medical records in [place]   HPI: [name] [name] is a 85 y.o. female with medical history significant of permanent atrial fibrillation, systolic congestive heart failure, hypertension, hyperlipidemia, chronic kidney disease stage III, and GERD; who presented with complaints of difficulty breathing over the last 2 days.  Recently hospitalized from [birth year]/6[birth year]2/[birth year] for systolic congestive heart failure exacerbation.  Patient was diuresed 8 L and echocardiogram revealed EF of 20-25% with signs of a left atrial mass/thrombus.  Discharged home on medications of metoprolol and Entresto, had not been continued on Lasix.  Plan was for her to follow-up with her PCP on the 19th and cardiology on the 25th of this month.  Since being home she reports being unable to rest at night due to her shortness of breath. Tried utilizing a wedge and pillow without relief of symptoms.  He has been compliant with all of her medications.  Associated symptoms included feelings of leg swelling, dryness of the mouth, and left-sided chest pain.  She reports chest pain is similar to previous.  She had not checked her weight recently.  Denies having any significant fever, chills, cough, nausea, vomiting, diarrhea, or dysuria symptoms.  ED Course: Upon admission into the emergency department patient was noted to be afebrile, currently in atrial fibrillation with pulse 50-67, respirations 9-29, and all other vital signs maintained.  Labs revealed creatinine 1.56(previously 1.29 on 8/14), BNP 1606.2 (previously 1385.5 on 8/8), and high-sensitivity troponin 16.  Chest x-ray showed cardiomegaly without overt failure.  Patient was given 40 mg of Lasix IV.  TRH  called to admit.  Review of Systems  Constitutional: Negative for chills and fever.  HENT: Negative for ear discharge and nosebleeds.   Eyes: Negative for double vision and photophobia.  Respiratory: Positive for shortness of breath. Negative for hemoptysis and wheezing.   Cardiovascular: Positive for chest pain, orthopnea and leg swelling.  Gastrointestinal: Negative for abdominal pain, diarrhea, nausea and vomiting.  Genitourinary: Negative for dysuria and hematuria.  Musculoskeletal: Negative for falls and neck pain.  Skin: Negative for rash.  Neurological: Negative for focal weakness and loss of consciousness.  Psychiatric/Behavioral: Negative for memory loss and substance abuse. The patient has insomnia.     Past Medical History:  Diagnosis Date  . Acute lymphadenitis of arm   . Anemia    iron deficiency  . Atrial fibrillation ([place])   . Breast cancer ([place])   . Depression 2009   grief  . Diverticulosis of colon 2004   Dr Deatra Ina  . GERD (gastroesophageal reflux disease)   . Heart murmur   . Helicobacter pylori gastritis   . Hematuria    Dr Terance Hart  . Hyperlipidemia   . Hypertension   . Osteoarthritis, knee   . Skin cancer   . Vitamin B12 deficiency   . Vitamin D deficiency     Past Surgical History:  Procedure Laterality Date  . ABDOMINAL HYSTERECTOMY    . CARDIOVERSION N/A 09/06/2018   Procedure: CARDIOVERSION;  Surgeon: Fay Records, MD;  Location: [facility] ENDOSCOPY;  Service: Cardiovascular;  Laterality: N/A; --> after initial success with sinus rhythm, the patient went back into A. fib within 2 minutes.  . CORONARY CT ANGIOGRAM  09/14/2018   Coronary  Calcium Score 276.  Mild mixed plaque in the left main with minimal stenosis.  Mixed plaque in the ostial LAD, proximal LCx and moderate size OM1, as well as proximal RCA (<50% stenosis throughout.).  Nonobstructive CAD.  Intermediate risk  . JOINT REPLACEMENT    . L groin skin cancer  2011  . MASTECTOMY  09-13-08    left and right  . TOTAL KNEE ARTHROPLASTY  06-04-98  . TOTAL KNEE ARTHROPLASTY Right 05/24/2014   Procedure: RIGHT TOTAL KNEE ARTHROPLASTY;  Surgeon: Newt Minion, MD;  Location: [facility];  Service: Orthopedics;  Laterality: Right;  . TRANSTHORACIC ECHOCARDIOGRAM  08/11/2018   Persistent A. fib: Mildly reduced EF of 45 to 50% but diffuse HK.  If A. fib the entire time.  Moderate LA and moderate to severe RA dilation (suggesting longstanding A. fib, and potentially explaining difficulty cardioversion).  No significant valve disease.  Aortic sclerosis no stenosis.  Unable to assess diastolic function with A. fib     reports that she has never smoked. She has never used smokeless tobacco. She reports that she does not drink alcohol or use drugs.  Allergies  Allergen Reactions  . Anastrozole Other (See Comments)    arthralgias  . Aspirin Other (See Comments)    Gi upset   . Ciprofloxacin Itching  . Letrozole Other (See Comments)    leg pain    Family History  Problem Relation Age of Onset  . Cancer Sister        breast  . Cancer Maternal Aunt        breast  . Coronary artery disease Other   . Cancer Other        aunt, niece  . Atrial fibrillation Other     Prior to Admission medications   Medication Sig Start Date End Date Taking? Authorizing Provider  acetaminophen (TYLENOL) 500 MG tablet Take 500 mg by mouth 2 (two) times daily as needed for moderate pain.    Yes [provider]  [facility] B-12 500 MCG SUBL Place 2 tablets (1,000 mcg total) under the tongue daily. 04/21/18  Yes Plotnikov, Evie Lacks, MD  diphenhydrAMINE (SOMINEX) 25 MG tablet Take 25 mg by mouth at bedtime.    Yes [provider]  ELIQUIS 5 MG TABS tablet TAKE 1 TABLET BY MOUTH TWICE DAILY Patient taking differently: Take 5 mg by mouth 2 (two) times daily.  10/26/18  Yes Leonie Man, MD  metoprolol tartrate (LOPRESSOR) 25 MG tablet Take 0.5 tablets (12.5 mg total) by mouth 2 (two) times daily. 04/15/19   Yes Caren Griffins, MD  Multiple Vitamin (MULTIVITAMIN WITH MINERALS) TABS tablet Take 1 tablet by mouth daily.   Yes [provider]  pantoprazole (PROTONIX) 40 MG tablet Take 1 tablet (40 mg total) by mouth 2 (two) times daily. 09/28/18  Yes Plotnikov, Evie Lacks, MD  potassium chloride SA (KLOR-CON M20) 20 MEQ tablet Take 1 tablet (20 mEq total) by mouth 2 (two) times daily. 09/28/18  Yes Plotnikov, Evie Lacks, MD  sacubitril-valsartan (ENTRESTO) 24-26 MG Take 1 tablet by mouth 2 (two) times daily. 04/16/19  Yes Gherghe, Vella Redhead, MD  zolpidem (AMBIEN) 5 MG tablet Take 0.5-1 tablets (2.5-5 mg total) by mouth at bedtime as needed for sleep (insomnia). 03/31/19  Yes Plotnikov, Evie Lacks, MD  loratadine (CLARITIN) 10 MG tablet Take 1 tablet (10 mg total) by mouth daily. Patient not taking: Reported on 04/09/2019 12/20/18   Plotnikov, Evie Lacks, MD  pravastatin (PRAVACHOL)  20 MG tablet Take 1 tablet (20 mg total) by mouth daily. Patient not taking: Reported on 04/09/2019 07/19/18 07/19/19  Plotnikov, Evie Lacks, MD    Physical Exam:  Constitutional: Elderly female in NAD, calm, comfortable Vitals:   04/18/19 0645 04/18/19 0700 04/18/19 0715 04/18/19 0724  BP: 117/67 112/66 116/78   Pulse: (!) 28 (!) 50 67   Resp: 14 16 16    Temp:      TempSrc:      SpO2: 97% 97% 95%   Weight:    80.7 kg  Height:    5\' 6"  (1.676 m)   Eyes: PERRL, lids and conjunctivae normal ENMT: Mucous membranes are moist. Posterior pharynx clear of any exudate or lesions.  Neck: normal, supple,  no thyromegaly.  Small less than 1 cm nodule on the left side of the neck present.  No JVD appreciated. Respiratory: clear to auscultation bilaterally, no wheezing, no crackles. Normal respiratory effort. No accessory muscle use.  Cardiovascular: Irregular irregular, no murmurs / rubs / gallops. No extremity edema. 2+ pedal pulses. No carotid bruits.  Abdomen: no tenderness, no masses palpated. No hepatosplenomegaly. Bowel  sounds positive.  Musculoskeletal: no clubbing / cyanosis. No joint deformity upper and lower extremities. Good ROM, no contractures. Normal muscle tone.  Skin: no rashes, lesions, ulcers. No induration Neurologic: CN 2-12 grossly intact. Sensation intact, DTR normal. Strength 5/5 in all 4.  Psychiatric: Normal judgment and insight. Alert and oriented x 3. Normal mood.     Labs on Admission: I have personally reviewed following labs and imaging studies  CBC: Recent Labs  Lab 04/18/19 0605  WBC 4.7  HGB 14.0  HCT 43.6  MCV 85.3  PLT 678   Basic Metabolic Panel: Recent Labs  Lab 04/12/19 0413 04/13/19 0459 04/14/19 0620 04/15/19 0543 04/18/19 0605  NA 138 137 138 139 137  K 3.5 3.7 3.6 3.5 3.7  CL 95* 97* 95* 100 100  CO2 31 30 31 29 26   GLUCOSE 86 100* 93 95 109*  BUN 18 23 23 23 19   CREATININE 1.20* 1.41* 1.48* 1.29* 1.56*  CALCIUM 9.1 9.0 9.1 9.2 9.5  MG 2.1  --   --   --   --    GFR: Estimated Creatinine Clearance: 28.8 mL/min (A) (by C-G formula based on SCr of 1.56 mg/dL (H)). Liver Function Tests: No results for input(s): AST, ALT, ALKPHOS, BILITOT, PROT, ALBUMIN in the last 168 hours. No results for input(s): LIPASE, AMYLASE in the last 168 hours. No results for input(s): AMMONIA in the last 168 hours. Coagulation Profile: No results for input(s): INR, PROTIME in the last 168 hours. Cardiac Enzymes: No results for input(s): CKTOTAL, CKMB, CKMBINDEX, TROPONINI in the last 168 hours. BNP (last 3 results) No results for input(s): PROBNP in the last 8760 hours. HbA1C: No results for input(s): HGBA1C in the last 72 hours. CBG: No results for input(s): GLUCAP in the last 168 hours. Lipid Profile: No results for input(s): CHOL, HDL, LDLCALC, TRIG, CHOLHDL, LDLDIRECT in the last 72 hours. Thyroid Function Tests: No results for input(s): TSH, T4TOTAL, FREET4, T3FREE, THYROIDAB in the last 72 hours. Anemia Panel: No results for input(s): VITAMINB12, FOLATE,  FERRITIN, TIBC, IRON, RETICCTPCT in the last 72 hours. Urine analysis:    Component Value Date/Time   COLORURINE YELLOW 04/09/2019 1852   APPEARANCEUR CLEAR 04/09/2019 1852   LABSPEC 1.006 04/09/2019 1852   PHURINE 5.0 04/09/2019 1852   GLUCOSEU NEGATIVE 04/09/2019 [place] NEGATIVE 07/28/2018 1620  HGBUR MODERATE (A) 04/09/2019 1852   BILIRUBINUR NEGATIVE 04/09/2019 1852   BILIRUBINUR neg 09/24/2015 1055   KETONESUR NEGATIVE 04/09/2019 1852   PROTEINUR NEGATIVE 04/09/2019 1852   UROBILINOGEN 1.0 07/28/2018 1620   NITRITE NEGATIVE 04/09/2019 1852   LEUKOCYTESUR NEGATIVE 04/09/2019 1852   Sepsis Labs: Recent Results (from the past 240 hour(s))  SARS Coronavirus 2 [facility] order, Performed in [facility] hospital lab) Nasopharyngeal Nasopharyngeal Swab     Status: None   Collection Time: 04/09/19  9:35 PM   Specimen: Nasopharyngeal Swab  Result Value Ref Range Status   SARS Coronavirus 2 NEGATIVE NEGATIVE Final    Comment: (NOTE) If result is NEGATIVE SARS-CoV-2 target nucleic acids are NOT DETECTED. The SARS-CoV-2 RNA is generally detectable in upper and lower  respiratory specimens during the acute phase of infection. The lowest  concentration of SARS-CoV-2 viral copies this assay can detect is 250  copies / mL. A negative result does not preclude SARS-CoV-2 infection  and should not be used as the sole basis for treatment or other  patient management decisions.  A negative result may occur with  improper specimen collection / handling, submission of specimen other  than nasopharyngeal swab, presence of viral mutation(s) within the  areas targeted by this assay, and inadequate number of viral copies  (<250 copies / mL). A negative result must be combined with clinical  observations, patient history, and epidemiological information. If result is POSITIVE SARS-CoV-2 target nucleic acids are DETECTED. The SARS-CoV-2 RNA is generally detectable in upper and lower   respiratory specimens dur ing the acute phase of infection.  Positive  results are indicative of active infection with SARS-CoV-2.  Clinical  correlation with patient history and other diagnostic information is  necessary to determine patient infection status.  Positive results do  not rule out bacterial infection or co-infection with other viruses. If result is PRESUMPTIVE POSTIVE SARS-CoV-2 nucleic acids MAY BE PRESENT.   A presumptive positive result was obtained on the submitted specimen  and confirmed on repeat testing.  While 2019 novel coronavirus  (SARS-CoV-2) nucleic acids may be present in the submitted sample  additional confirmatory testing may be necessary for epidemiological  and / or clinical management purposes  to differentiate between  SARS-CoV-2 and other Sarbecovirus currently known to infect humans.  If clinically indicated additional testing with an alternate test  methodology [phone]) is advised. The SARS-CoV-2 RNA is generally  detectable in upper and lower respiratory sp ecimens during the acute  phase of infection. The expected result is Negative. Fact Sheet for Patients:  StrictlyIdeas.no Fact Sheet for Healthcare Providers: BankingDealers.co.za This test is not yet approved or cleared by the [place] FDA and has been authorized for detection and/or diagnosis of SARS-CoV-2 by FDA under an Emergency Use Authorization (EUA).  This EUA will remain in effect (meaning this test can be used) for the duration of the COVID-19 declaration under Section 564(b)(1) of the Act, 21 U.S.C. section 360bbb-3(b)(1), unless the authorization is terminated or revoked sooner. Performed at [facility], [place] [address], [place], [place] [address]      Radiological Exams on Admission: Dg Chest 2 View  Result Date: 04/18/2019 CLINICAL DATA:  Chest pain EXAM: CHEST - 2 VIEW COMPARISON:  CT from 8 days ago FINDINGS:  Cardiomegaly without edema or effusion. No air bronchogram or pneumothorax. No acute osseous finding.  Postoperative left axilla. IMPRESSION: Cardiomegaly without failure. Electronically Signed   By: Monte Fantasia M.D.   On:  04/18/2019 06:34    EKG: Independently reviewed.  Atrial fibrillation at 48 bpm  Assessment/Plan Systolic congestive heart failure exacerbation: Acute on chronic.  Patient presents with complaints of worsening shortness of breath and chest discomfort.  Chest x-ray showing cardiomegaly without cute failure.  BNP elevated at 1606.2 which is higher than previous admission.  At discharge just 3 days ago patient was not continued on a diuretic.  Patient was given 40 mg of Lasix IV here in the emergency department.  She normally follows with Dr. Selena Batten in the outpatient setting and her follow-up appointment was scheduled for the 25th of this month. - Admit to a telemetry bed - Heart failure orders set  initiated  - Continuous pulse oximetry with nasal cannula oxygen as needed to keep O2 saturations >92% - Strict I&Os and daily weights - Elevate lower extremities - Lasix 40 mg IV Bid - Reassess in a.m. and adjust diuresis as needed.  Chest pain: Acute on chronic.  On admission troponin negative.  EKG showing atrial fibrillation.  Pulmonary embolus previously evaluated during last admission and less likely as she is on Eliquis.  Suspect likely secondary to demand. -Follow-up telemetry  Acute kidney injury superimposed on chronic kidney disease stage III: Patient presents with creatinine elevated up to 1.56.  Baseline creatinine previously noted to be around 1 prior to her last admission. -Daily monitoring of creatinine while diuresing  Permanent atrial fibrillation: Chronic.  Patient on Eliquis. CHA2DS2-VASc score =5. -Continue Eliquis   Essential hypertension: Blood pressures well controlled.  Previously on amlodipine and Cozaar, but was discontinued during last hospitalization  to start patient on Entresto and metoprolol. -Continue metoprolol  -Holding Entresto per cardiology  Left atrial mass: To be treated with apixaban and cardiology had previously recommended repeat echocardiogram in approximately 3 months.  Question thrombus versus mass. -Outpatient follow-up for repeat echocardiogram with cardiology  Hyperlipidemia -Continue pravastatin  GERD -Continue Protonix  DVT prophylaxis: Eliquis  Code Status: Full Family Communication: Son was updated Disposition Plan: Possible discharge home in 1 to 2 days Consults called: cardiology  Admission status: observation  Norval Morton MD [facility] Pager [phone]   If 7PM-7AM, please contact night-coverage [email or web address] Password [email or web address]  04/18/2019, 8:02 AM

## 2019-04-18 NOTE — Progress Notes (Signed)
Nursing staff notified me that patient had 8 beat run of V. tach and was was asymptomatic at 5:40 pm.  Goal potassium 4 and magnesium 2.  Will check magnesium level and replace as needed.

## 2019-04-18 NOTE — ED Notes (Signed)
Report given to [facility]. Pt transported to [place] by [facility] NT

## 2019-04-18 NOTE — Consult Note (Addendum)
Cardiology Consultation:   Patient ID: [name] [name]; [id number]; 11/25/1932   Admit date: 04/18/2019 Date of Consult: 04/18/2019  Primary Care Provider: Cassandria Anger, MD Primary Cardiologist: Glenetta Hew, MD Primary Electrophysiologist:  None  Patient Profile:   [name] [name] is a 85 y.o. female with a PMH of chronic combined CHF, permanent atrial fibrillation, non-obstructive CAD noted on coronary CTA 09/2018, LA mass c/f thrombus on echo 8/20202, HTN, HLD, CKD stage 3, and remote breast cancer who is being seen today for the evaluation of CHF at the request of Dr. Tamala Julian.  History of Present Illness:   Ms. Dunton was recently admitted to the hospital from 04/09/2019-04/15/2019 for management of acute combined CHF where echocardiogram revealed an EF 20-25% and possible LA mass/thrombus. She was diuresed with IV lasix and felt to be dry on discharge, therefore lasix was not continued at discharge. Unfortunately she began experiencing SOB and orthopnea since discharge which progressively worsened over the weekend prompting her to present to the ED for further evaluation.  At the time of this evaluation she reports improvement in her SOB. She has been sleeping poorly due to orthopnea and occasional PND. She reports intermittent chest pain which is chronic and unchanged from previous - she states this has been an issue since her mastectomy. She has also been experiencing bilateral LE pain over the weekend. No complaints of dizziness, lightheadedness, or syncope.    Hospital course: intermittently tachypneic and bradycardic to the 50s, otherwise VSS. Labs notable to electrolyes wnl, Cr 1.56 (baseline ~1.0), CBC wnl, BNP 1606 (up from 1385 04/09/2019), Trop 16>18. COVID 19 negative. CXR with stable cardiomegaly without edema or effusions.  EKG with atrial fibrillation, rate 48, LBBB (appears to be incomplete on EKG 04/09/2019), no STE/D, isolated TWI in V6; no significant change from previous.  She was given IV lasix 40mg  in th ED. Cardiology asked to evaluate for CHF.   Past Medical History:  Diagnosis Date   Acute lymphadenitis of arm    Anemia    iron deficiency   Atrial fibrillation ([place])    Breast cancer ([place])    Depression 2009   grief   Diverticulosis of colon 2004   Dr Deatra Ina   GERD (gastroesophageal reflux disease)    Heart murmur    Helicobacter pylori gastritis    Hematuria    Dr Terance Hart   Hyperlipidemia    Hypertension    Osteoarthritis, knee    Skin cancer    Vitamin B12 deficiency    Vitamin D deficiency     Past Surgical History:  Procedure Laterality Date   ABDOMINAL HYSTERECTOMY     CARDIOVERSION N/A 09/06/2018   Procedure: CARDIOVERSION;  Surgeon: Fay Records, MD;  Location: [facility] ENDOSCOPY;  Service: Cardiovascular;  Laterality: N/A; --> after initial success with sinus rhythm, the patient went back into A. fib within 2 minutes.   CORONARY CT ANGIOGRAM  09/14/2018   Coronary Calcium Score 276.  Mild mixed plaque in the left main with minimal stenosis.  Mixed plaque in the ostial LAD, proximal LCx and moderate size OM1, as well as proximal RCA (<50% stenosis throughout.).  Nonobstructive CAD.  Intermediate risk   JOINT REPLACEMENT     L groin skin cancer  2011   MASTECTOMY  09-13-08   left and right   TOTAL KNEE ARTHROPLASTY  06-04-98   TOTAL KNEE ARTHROPLASTY Right 05/24/2014   Procedure: RIGHT TOTAL KNEE ARTHROPLASTY;  Surgeon: Newt Minion, MD;  Location: [facility]  OR;  Service: Orthopedics;  Laterality: Right;   TRANSTHORACIC ECHOCARDIOGRAM  08/11/2018   Persistent A. fib: Mildly reduced EF of 45 to 50% but diffuse HK.  If A. fib the entire time.  Moderate LA and moderate to severe RA dilation (suggesting longstanding A. fib, and potentially explaining difficulty cardioversion).  No significant valve disease.  Aortic sclerosis no stenosis.  Unable to assess diastolic function with A. fib     Home Medications:  Prior to  Admission medications   Medication Sig Start Date End Date Taking? Authorizing Provider  acetaminophen (TYLENOL) 500 MG tablet Take 500 mg by mouth 2 (two) times daily as needed for moderate pain.    Yes [provider]  [employer] B-12 500 MCG SUBL Place 2 tablets (1,000 mcg total) under the tongue daily. 04/21/18  Yes Plotnikov, Evie Lacks, MD  diphenhydrAMINE (SOMINEX) 25 MG tablet Take 25 mg by mouth at bedtime.    Yes [provider]  ELIQUIS 5 MG TABS tablet TAKE 1 TABLET BY MOUTH TWICE DAILY Patient taking differently: Take 5 mg by mouth 2 (two) times daily.  10/26/18  Yes Leonie Man, MD  metoprolol tartrate (LOPRESSOR) 25 MG tablet Take 0.5 tablets (12.5 mg total) by mouth 2 (two) times daily. 04/15/19  Yes Caren Griffins, MD  Multiple Vitamin (MULTIVITAMIN WITH MINERALS) TABS tablet Take 1 tablet by mouth daily.   Yes [provider]  pantoprazole (PROTONIX) 40 MG tablet Take 1 tablet (40 mg total) by mouth 2 (two) times daily. 09/28/18  Yes Plotnikov, Evie Lacks, MD  potassium chloride SA (KLOR-CON M20) 20 MEQ tablet Take 1 tablet (20 mEq total) by mouth 2 (two) times daily. 09/28/18  Yes Plotnikov, Evie Lacks, MD  sacubitril-valsartan (ENTRESTO) 24-26 MG Take 1 tablet by mouth 2 (two) times daily. 04/16/19  Yes Gherghe, Vella Redhead, MD  zolpidem (AMBIEN) 5 MG tablet Take 0.5-1 tablets (2.5-5 mg total) by mouth at bedtime as needed for sleep (insomnia). 03/31/19  Yes Plotnikov, Evie Lacks, MD  loratadine (CLARITIN) 10 MG tablet Take 1 tablet (10 mg total) by mouth daily. Patient not taking: Reported on 04/09/2019 12/20/18   Plotnikov, Evie Lacks, MD  pravastatin (PRAVACHOL) 20 MG tablet Take 1 tablet (20 mg total) by mouth daily. Patient not taking: Reported on 04/09/2019 07/19/18 07/19/19  Plotnikov, Evie Lacks, MD    Inpatient Medications: Scheduled Meds:  apixaban  5 mg Oral BID   diphenhydrAMINE  25 mg Oral QHS   furosemide  40 mg Intravenous BID   metoprolol  tartrate  12.5 mg Oral BID   pantoprazole  40 mg Oral BID   potassium chloride SA  20 mEq Oral BID   pravastatin  20 mg Oral q1800   sacubitril-valsartan  1 tablet Oral BID   sodium chloride flush  3 mL Intravenous Q12H   Continuous Infusions:  sodium chloride     PRN Meds: sodium chloride, acetaminophen, albuterol, ondansetron (ZOFRAN) IV, sodium chloride flush, zolpidem  Allergies:    Allergies  Allergen Reactions   Anastrozole Other (See Comments)    arthralgias   Aspirin Other (See Comments)    Gi upset    Ciprofloxacin Itching   Letrozole Other (See Comments)    leg pain    Social History:   Social History   Socioeconomic History   Marital status: Widowed    Spouse name: Not on file   Number of children: 7   Years of education: Not on file   Highest education  level: Not on file  Occupational History   Not on file  Social Needs   Financial resource strain: Not hard at all   Food insecurity    Worry: Never true    Inability: Never true   Transportation needs    Medical: No    Non-medical: No  Tobacco Use   Smoking status: Never Smoker   Smokeless tobacco: Never Used  Substance and Sexual Activity   Alcohol use: No   Drug use: No   Sexual activity: Never  Lifestyle   Physical activity    Days per week: 3 days    Minutes per session: 50 min   Stress: Not at all  Relationships   Social connections    Talks on phone: More than three times a week    Gets together: More than three times a week    Attends religious service: More than 4 times per year    Active member of club or organization: Yes    Attends meetings of clubs or organizations: More than 4 times per year    Relationship status: Widowed   Intimate partner violence    Fear of current or ex partner: Not on file    Emotionally abused: Not on file    Physically abused: Not on file    Forced sexual activity: Not on file  Other Topics Concern   Not on file  Social  History Narrative   Not on file    Family History:    Family History  Problem Relation Age of Onset   Cancer Sister        breast   Cancer Maternal Aunt        breast   Coronary artery disease Other    Cancer Other        aunt, niece   Atrial fibrillation Other      ROS:  Please see the history of present illness.   All other ROS reviewed and negative.     Physical Exam/Data:   Vitals:   04/18/19 0830 04/18/19 0845 04/18/19 0915 04/18/19 0930  BP: 109/71 116/73 113/77 (!) 103/56  Pulse: 68 (!) 55 68 69  Resp: (!) 29 16 15 20   Temp:      TempSrc:      SpO2: 98% 98% 99% 98%  Weight:      Height:       No intake or output data in the 24 hours ending 04/18/19 1155 Filed Weights   04/18/19 0724  Weight: 80.7 kg   Body mass index is 28.71 kg/m.  General:  Well nourished, well developed, laying in bed in no acute distress HEENT: sclera anicteric  Neck: JVD ~3cm above clavicle  Vascular: No carotid bruits; distal pulses 2+ bilaterally Cardiac:  normal S1, S2; RRR; no murmurs, rubs, or gallops Lungs:  clear to auscultation bilaterally, no wheezing, rhonchi or rales  Abd: NABS, soft, nontender, no hepatomegaly Ext: no edema Musculoskeletal:  No deformities, BUE and BLE strength normal and equal Skin: warm and dry  Neuro:  CNs 2-12 intact, no focal abnormalities noted Psych:  Normal affect   EKG:  The EKG was personally reviewed and demonstrates:  atrial fibrillation, rate 48, LBBB (appears to be incomplete on EKG 04/09/2019), no STE/D, isolated TWI in V6; no significant change from previous. Telemetry:  Telemetry was personally reviewed and demonstrates:  Atrial fibrillation with CVR  Relevant CV Studies: Echocardiogram 04/10/2019: IMPRESSIONS    1. The left ventricle has severely reduced systolic function,  with an ejection fraction of 20-25%. The cavity size was mildly dilated. Left ventricular diastolic function could not be evaluated secondary to atrial  fibrillation. Elevated left atrial and  left ventricular end-diastolic pressures Left ventricular diffuse hypokinesis.  2. There is akinesis of the basal and mid infero and anteroseptum, apical septum, apical anterior and apical inferolaterl akinesis.  3. The right ventricle has moderately reduced systolic function. The cavity was severely enlarged. There is no increase in right ventricular wall thickness. Right ventricular systolic pressure is moderately elevated at 40mmhg.  4. Left atrial size was severely dilated.  5. There is a 2.3 x 1.43cm mass in the posterior LA of uncler significance. Cannot rule out thrombus. Consider TEE if clinically indicated.  6. Right atrial size was severely dilated.  7. Mild thickening of the mitral valve leaflet. Mitral valve regurgitation is mild to moderate by color flow Doppler.  8. The aortic valve is tricuspid. Moderate sclerosis of the aortic valve. Aortic valve regurgitation was not assessed by color flow Doppler. Moderate aortic annular calcification noted.  9. Tricuspid valve regurgitation is moderate. 10. The aorta is normal in size and structure. 11. The inferior vena cava was dilated in size with <50% respiratory variability.  Laboratory Data:  Chemistry Recent Labs  Lab 04/14/19 0620 04/15/19 0543 04/18/19 0605  NA 138 139 137  K 3.6 3.5 3.7  CL 95* 100 100  CO2 31 29 26   GLUCOSE 93 95 109*  BUN 23 23 19   CREATININE 1.48* 1.29* 1.56*  CALCIUM 9.1 9.2 9.5  GFRNONAA 32* 38* 30*  GFRAA 37* 44* 35*  ANIONGAP 12 10 11     No results for input(s): PROT, ALBUMIN, AST, ALT, ALKPHOS, BILITOT in the last 168 hours. Hematology Recent Labs  Lab 04/18/19 0605  WBC 4.7  RBC 5.11  HGB 14.0  HCT 43.6  MCV 85.3  MCH 27.4  MCHC 32.1  RDW 15.1  PLT 226   Cardiac EnzymesNo results for input(s): TROPONINI in the last 168 hours. No results for input(s): TROPIPOC in the last 168 hours.  BNP Recent Labs  Lab 04/18/19 0605  BNP 1,606.2*     DDimer No results for input(s): DDIMER in the last 168 hours.  Radiology/Studies:  Dg Chest 2 View  Result Date: 04/18/2019 CLINICAL DATA:  Chest pain EXAM: CHEST - 2 VIEW COMPARISON:  CT from 8 days ago FINDINGS: Cardiomegaly without edema or effusion. No air bronchogram or pneumothorax. No acute osseous finding.  Postoperative left axilla. IMPRESSION: Cardiomegaly without failure. Electronically Signed   By: Monte Fantasia M.D.   On: 04/18/2019 06:34    Assessment and Plan:   1. Acute on chronic combined CHF: patient presented with recurrent SOB and orthopnea. Recently admitted 8/8-8/14/2020 for acute combined CHF. Echo with EF 20-25%. She was diuresed during recent admission but lasix held at discharge as she was felt to be dry. She was started on entresto and metoprolol succinate a well. BNP up to 1600s from 1300s last week. CXR without edema. She reports improvement in her breathing with IV lasix 40mg  in the ED. Cr up to 1.56 from 1.29 at discharge (baseline 1.0) - Will hold entresto to allow room in Cr to diuresis - Continue IV lasix 40mg  BID - Continue metoprolol succinate  - Monitor strict I&Os and daily weights - Continue to monitor electrolytes and replete as needed to maintain K >4, Mg >2.   2. Chest pain in patient with non-obstructive CAD: she reports recent chest pain which  is not uncommon for her. Coronary CTA 09/2018 with non-obstructive disease. EKG wihtout ischemiaTrops 16>18. Trend not consistent with ACS.  - No further cardiac work-up at this time.   3. Permanent atrial fibrillation: rate well controlled on metoprolol.  - Continue metoprolol - Continue eliquis for stroke ppx given CHA2DS2-VASc Score of 5 (CHF, Vasc, Female, Age >75)  4. LA mass: suspected thrombus vs mass vs artifact on echo 04/10/2019.  - Continue apixaban 5mg  BID - Could consider cardiac MRI to further evaluate LA findings  5. HLD: LDL 138 04/10/2019. Pravastatin increased during recent admission.  Could be contributing to her LE pain.  - Continue pravastatin for now   For questions or updates, please contact [place] Please consult [email or web address] for contact info under Cardiology/STEMI.   Signed, Abigail Butts, PA-C  04/18/2019 11:55 AM [phone]  Patient seen and examined. She is an 85 y.o. female with a PMH of chronic combined CHF, permanent atrial fibrillation, non-obstructive CAD noted on coronary CTA 09/2018, LA mass c/f thrombus on echo 8/20202, HTN, HLD, CKD stage 3, and remote breast cancer who presented with dyspnea.  Discharged from [facility] on 04/15/19, after being admitted for decompensated heart failure.  She was not taking lasix on discharge and states that she began having dyspnea on 8/15, which progressively worsened leading to her presentation to ED today.  On exam, she has JVD ~3cm above clavicle, no LE edema; heart rhythm slow and irregular; lungs clear to auscultation.  EKG personally reviewed and shows LBBB, Afib with rate 48 bpm.  Labs show elevated Cr (1.29 8/14 to 1.56 today) and BNP 1606 (up from 1385 on last presentation for decompensated heart failure).  She was given IV lasix in the ED with -850cc urine output and reports improvement in symptoms.     Presentation consistent with decompensated heart failure, likely due to not taking lasix last few days.  Agree with IV lasix 40 mg BID.  Would hold entresto given AKI.    For LA mass, can plan for further evaluation with cardiac MRI once renal function improved and able to lie flat for exam.  Donato Heinz, MD

## 2019-04-18 NOTE — Progress Notes (Signed)
Pt  Received to 3e28, pt alert and oriented, vss, pt assisted to bed, call light in reach, CCMD called and af on tele

## 2019-04-18 NOTE — ED Triage Notes (Signed)
Pt comes from home via [facility], just discharged on Friday, tonight began having CP and SOB, denies n/v

## 2019-04-19 ENCOUNTER — Inpatient Hospital Stay (HOSPITAL_COMMUNITY): Payer: Medicare Other

## 2019-04-19 DIAGNOSIS — Z7901 Long term (current) use of anticoagulants: Secondary | ICD-10-CM | POA: Diagnosis not present

## 2019-04-19 DIAGNOSIS — Z9013 Acquired absence of bilateral breasts and nipples: Secondary | ICD-10-CM | POA: Diagnosis not present

## 2019-04-19 DIAGNOSIS — R079 Chest pain, unspecified: Secondary | ICD-10-CM | POA: Diagnosis present

## 2019-04-19 DIAGNOSIS — G8929 Other chronic pain: Secondary | ICD-10-CM | POA: Diagnosis present

## 2019-04-19 DIAGNOSIS — Z85828 Personal history of other malignant neoplasm of skin: Secondary | ICD-10-CM | POA: Diagnosis not present

## 2019-04-19 DIAGNOSIS — I10 Essential (primary) hypertension: Secondary | ICD-10-CM | POA: Diagnosis not present

## 2019-04-19 DIAGNOSIS — I5189 Other ill-defined heart diseases: Secondary | ICD-10-CM | POA: Diagnosis not present

## 2019-04-19 DIAGNOSIS — Z96651 Presence of right artificial knee joint: Secondary | ICD-10-CM | POA: Diagnosis present

## 2019-04-19 DIAGNOSIS — I5043 Acute on chronic combined systolic (congestive) and diastolic (congestive) heart failure: Secondary | ICD-10-CM | POA: Diagnosis present

## 2019-04-19 DIAGNOSIS — Z881 Allergy status to other antibiotic agents status: Secondary | ICD-10-CM | POA: Diagnosis not present

## 2019-04-19 DIAGNOSIS — N179 Acute kidney failure, unspecified: Secondary | ICD-10-CM | POA: Diagnosis not present

## 2019-04-19 DIAGNOSIS — I4821 Permanent atrial fibrillation: Secondary | ICD-10-CM | POA: Diagnosis not present

## 2019-04-19 DIAGNOSIS — Z888 Allergy status to other drugs, medicaments and biological substances status: Secondary | ICD-10-CM | POA: Diagnosis not present

## 2019-04-19 DIAGNOSIS — I2510 Atherosclerotic heart disease of native coronary artery without angina pectoris: Secondary | ICD-10-CM | POA: Diagnosis present

## 2019-04-19 DIAGNOSIS — R0602 Shortness of breath: Secondary | ICD-10-CM | POA: Diagnosis not present

## 2019-04-19 DIAGNOSIS — Z886 Allergy status to analgesic agent status: Secondary | ICD-10-CM | POA: Diagnosis not present

## 2019-04-19 DIAGNOSIS — K219 Gastro-esophageal reflux disease without esophagitis: Secondary | ICD-10-CM | POA: Diagnosis present

## 2019-04-19 DIAGNOSIS — I5023 Acute on chronic systolic (congestive) heart failure: Secondary | ICD-10-CM | POA: Diagnosis not present

## 2019-04-19 DIAGNOSIS — E785 Hyperlipidemia, unspecified: Secondary | ICD-10-CM | POA: Diagnosis present

## 2019-04-19 DIAGNOSIS — I509 Heart failure, unspecified: Secondary | ICD-10-CM

## 2019-04-19 DIAGNOSIS — Z853 Personal history of malignant neoplasm of breast: Secondary | ICD-10-CM | POA: Diagnosis not present

## 2019-04-19 DIAGNOSIS — N183 Chronic kidney disease, stage 3 (moderate): Secondary | ICD-10-CM | POA: Diagnosis present

## 2019-04-19 DIAGNOSIS — Z90710 Acquired absence of both cervix and uterus: Secondary | ICD-10-CM | POA: Diagnosis not present

## 2019-04-19 DIAGNOSIS — N189 Chronic kidney disease, unspecified: Secondary | ICD-10-CM | POA: Diagnosis not present

## 2019-04-19 DIAGNOSIS — I130 Hypertensive heart and chronic kidney disease with heart failure and stage 1 through stage 4 chronic kidney disease, or unspecified chronic kidney disease: Secondary | ICD-10-CM | POA: Diagnosis present

## 2019-04-19 DIAGNOSIS — K5730 Diverticulosis of large intestine without perforation or abscess without bleeding: Secondary | ICD-10-CM | POA: Diagnosis present

## 2019-04-19 DIAGNOSIS — Z809 Family history of malignant neoplasm, unspecified: Secondary | ICD-10-CM | POA: Diagnosis not present

## 2019-04-19 DIAGNOSIS — Z20828 Contact with and (suspected) exposure to other viral communicable diseases: Secondary | ICD-10-CM | POA: Diagnosis present

## 2019-04-19 DIAGNOSIS — Z79899 Other long term (current) drug therapy: Secondary | ICD-10-CM | POA: Diagnosis not present

## 2019-04-19 DIAGNOSIS — Z8249 Family history of ischemic heart disease and other diseases of the circulatory system: Secondary | ICD-10-CM | POA: Diagnosis not present

## 2019-04-19 HISTORY — PX: OTHER SURGICAL HISTORY: SHX169

## 2019-04-19 LAB — BASIC METABOLIC PANEL
Anion gap: 10 (ref 5–15)
BUN: 26 mg/dL — ABNORMAL HIGH (ref 8–23)
CO2: 28 mmol/L (ref 22–32)
Calcium: 9.1 mg/dL (ref 8.9–10.3)
Chloride: 99 mmol/L (ref 98–111)
Creatinine, Ser: 1.57 mg/dL — ABNORMAL HIGH (ref 0.44–1.00)
GFR calc Af Amer: 35 mL/min — ABNORMAL LOW (ref >60)
GFR calc non Af Amer: 30 mL/min — ABNORMAL LOW (ref >60)
Glucose, Bld: 98 mg/dL (ref 70–99)
Potassium: 3.8 mmol/L (ref 3.5–5.1)
Sodium: 137 mmol/L (ref 135–145)

## 2019-04-19 LAB — CBC WITH DIFFERENTIAL/PLATELET
Abs Immature Granulocytes: 0 10*3/uL (ref 0.00–0.07)
Basophils Absolute: 0 10*3/uL (ref 0.0–0.1)
Basophils Relative: 0 %
Eosinophils Absolute: 0.3 10*3/uL (ref 0.0–0.5)
Eosinophils Relative: 7 %
HCT: 39.2 % (ref 36.0–46.0)
Hemoglobin: 12.9 g/dL (ref 12.0–15.0)
Lymphocytes Relative: 69 %
Lymphs Abs: 3.2 10*3/uL (ref 0.7–4.0)
MCH: 27.6 pg (ref 26.0–34.0)
MCHC: 32.9 g/dL (ref 30.0–36.0)
MCV: 83.8 fL (ref 80.0–100.0)
Monocytes Absolute: 0.5 10*3/uL (ref 0.1–1.0)
Monocytes Relative: 10 %
Neutro Abs: 0.7 10*3/uL — ABNORMAL LOW (ref 1.7–7.7)
Neutrophils Relative %: 14 %
Platelets: 234 10*3/uL (ref 150–400)
RBC: 4.68 MIL/uL (ref 3.87–5.11)
RDW: 15.1 % (ref 11.5–15.5)
WBC: 4.7 10*3/uL (ref 4.0–10.5)
nRBC: 0 % (ref 0.0–0.2)
nRBC: 0 /100 WBC

## 2019-04-19 LAB — MAGNESIUM: Magnesium: 1.8 mg/dL (ref 1.7–2.4)

## 2019-04-19 MED ORDER — MAGNESIUM OXIDE 400 (241.3 MG) MG PO TABS
400.0000 mg | ORAL_TABLET | Freq: Every day | ORAL | Status: AC
  Administered 2019-04-19 – 2019-04-20 (×2): 400 mg via ORAL
  Filled 2019-04-19 (×2): qty 1

## 2019-04-19 MED ORDER — FUROSEMIDE 10 MG/ML IJ SOLN: 40.0000 mg | Freq: Every day | INTRAMUSCULAR | Status: DC

## 2019-04-19 MED ORDER — GADOBUTROL 1 MMOL/ML IV SOLN
7.0000 mL | Freq: Once | INTRAVENOUS | Status: AC | PRN
  Administered 2019-04-19: 7 mL via INTRAVENOUS

## 2019-04-19 MED ORDER — APIXABAN 2.5 MG PO TABS
2.5000 mg | ORAL_TABLET | Freq: Two times a day (BID) | ORAL | Status: DC
  Administered 2019-04-19 – 2019-04-21 (×4): 2.5 mg via ORAL
  Filled 2019-04-19 (×4): qty 1

## 2019-04-19 NOTE — Progress Notes (Addendum)
Progress Note  Patient Name: [name] [name] Date of Encounter: 04/19/2019  Primary Cardiologist: Glenetta Hew, MD  Subjective   Pt reports symptom improvement since admission. Denies recurrent SOB or anginal symptoms.   Inpatient Medications    Scheduled Meds:  apixaban  5 mg Oral BID   diphenhydrAMINE  25 mg Oral QHS   furosemide  40 mg Intravenous BID   magnesium oxide  400 mg Oral Daily   metoprolol tartrate  12.5 mg Oral BID   pantoprazole  40 mg Oral BID   potassium chloride SA  20 mEq Oral BID   pravastatin  20 mg Oral q1800   sodium chloride flush  3 mL Intravenous Q12H   Continuous Infusions:  sodium chloride     PRN Meds: sodium chloride, acetaminophen, albuterol, ondansetron (ZOFRAN) IV, sodium chloride flush, zolpidem   Vital Signs    Vitals:   04/18/19 1731 04/18/19 2023 04/19/19 0043 04/19/19 0605  BP: 97/78 101/60 101/68 104/70  Pulse: 71 95 62 66  Resp:  18 18 20   Temp:  98.1 F (36.7 C) 97.9 F (36.6 C) 97.8 F (36.6 C)  TempSrc:  Oral Oral Oral  SpO2: 98% 99% 96% 94%  Weight:    83.9 kg  Height:        Intake/Output Summary (Last 24 hours) at 04/19/2019 0817 Last data filed at 04/19/2019 0500 Gross per 24 hour  Intake --  Output 1850 ml  Net -1850 ml   Filed Weights   04/18/19 0724 04/18/19 1631 04/19/19 0605  Weight: 80.7 kg 80.5 kg 83.9 kg    Physical Exam   General: Elderly, NAD Neck: Negative for carotid bruits. No JVD Lungs:Clear to ausculation bilaterally. No wheezes, rales, or rhonchi. Breathing is unlabored. Cardiovascular: Irregularly irregular with S1 S2. No murmurs, rubs, gallops, or LV heave appreciated. Abdomen: Soft, non-tender, non-distended. No obvious abdominal masses. Extremities: No edema. No clubbing or cyanosis. DP/PT pulses 2+ bilaterally Neuro: Alert and oriented. No focal deficits. No facial asymmetry. MAE spontaneously. Psych: Responds to questions appropriately with normal affect.    Labs     Chemistry Recent Labs  Lab 04/15/19 0543 04/18/19 0605 04/19/19 0421  NA 139 137 137  K 3.5 3.7 3.8  CL 100 100 99  CO2 29 26 28   GLUCOSE 95 109* 98  BUN 23 19 26*  CREATININE 1.29* 1.56* 1.57*  CALCIUM 9.2 9.5 9.1  GFRNONAA 38* 30* 30*  GFRAA 44* 35* 35*  ANIONGAP 10 11 10      Hematology Recent Labs  Lab 04/18/19 0605 04/19/19 0421  WBC 4.7 4.7  RBC 5.11 4.68  HGB 14.0 12.9  HCT 43.6 39.2  MCV 85.3 83.8  MCH 27.4 27.6  MCHC 32.1 32.9  RDW 15.1 15.1  PLT 226 234    Cardiac EnzymesNo results for input(s): TROPONINI in the last 168 hours. No results for input(s): TROPIPOC in the last 168 hours.   BNP Recent Labs  Lab 04/18/19 0605  BNP 1,606.2*     DDimer No results for input(s): DDIMER in the last 168 hours.   Radiology    Dg Chest 2 View  Result Date: 04/18/2019 CLINICAL DATA:  Chest pain EXAM: CHEST - 2 VIEW COMPARISON:  CT from 8 days ago FINDINGS: Cardiomegaly without edema or effusion. No air bronchogram or pneumothorax. No acute osseous finding.  Postoperative left axilla. IMPRESSION: Cardiomegaly without failure. Electronically Signed   By: Monte Fantasia M.D.   On: 04/18/2019 06:34  Telemetry    HR slow, AF 04/19/2019- Personally Reviewed  ECG    04/18/2019 AF with LBBB and HR 48bpm- Personally Reviewed  Cardiac Studies   Echocardiogram 04/10/2019:  1. The left ventricle has severely reduced systolic function, with an ejection fraction of 20-25%. The cavity size was mildly dilated. Left ventricular diastolic function could not be evaluated secondary to atrial fibrillation. Elevated left atrial and  left ventricular end-diastolic pressures Left ventricular diffuse hypokinesis. 2. There is akinesis of the basal and mid infero and anteroseptum, apical septum, apical anterior and apical inferolaterl akinesis. 3. The right ventricle has moderately reduced systolic function. The cavity was severely enlarged. There is no increase in right  ventricular wall thickness. Right ventricular systolic pressure is moderately elevated at 8mmhg. 4. Left atrial size was severely dilated. 5. There is a 2.3 x 1.43cm mass in the posterior LA of uncler significance. Cannot rule out thrombus. Consider TEE if clinically indicated. 6. Right atrial size was severely dilated. 7. Mild thickening of the mitral valve leaflet. Mitral valve regurgitation is mild to moderate by color flow Doppler. 8. The aortic valve is tricuspid. Moderate sclerosis of the aortic valve. Aortic valve regurgitation was not assessed by color flow Doppler. Moderate aortic annular calcification noted. 9. Tricuspid valve regurgitation is moderate. 10. The aorta is normal in size and structure. 11. The inferior vena cava was dilated in size with <50% respiratory variability.  Patient Profile     85 y.o. female with a PMH of chronic combined CHF, permanent atrial fibrillation, non-obstructive CAD noted on coronary CTA 09/2018, LA mass c/f thrombus on echo 8/20202, HTN, HLD, CKD stage 3, and remote breast cancer who is being seen today for the evaluation of CHF at the request of Dr. Tamala Julian.  Assessment & Plan    1. Acute on chronic combined CHF: -Admitted with recurrent shortness of breath and orthopnea after recent admitted 8/8-8/14/2020 for acute combined CHF.  -Echocardiogram at that time with LVEF of 20 to 25%, she was diuresed however Lasix was held at discharge as she was felt to be dry.   -Presented with recurrent symptoms with BNP in the 1600s from 1300s last week. She was given IV Lasix 40 mg x 1 in the ED with improvement in her symptoms -Entresto held on presentation secondary to AKI -Continue IV Lasix 40 mg daily for one additional day and continue to monitor rneal function -Continue metoprolol succinate -Weight,185lb today>>up ftom 177lb?? Will have nursing staff re-weigh  -I&O, net negative 1.8L since admission   2. Chest pain in patient with non-obstructive  CAD: -Had a recent chest pain, noted to be chronic>>denies symptoms today  -Coronary CTA 09/2018 with nonobstructive disease -EKG on presentation without ischemia and HsT not consistent with ACS -No further cardiac work-up at this time.   3. Permanent atrial fibrillation:  -Rate controlled on beta-blocker, HR 60's  -Continue metoprolol -Continue Eliquis for stroke ppx given CHA2DS2-VASc Score of 5 (CHF, Vasc, Female, Age >75)  4. LA mass:  -Suspected thrombus vs mass vs artifact on echo 04/10/2019.  -Continue apixaban 5mg  BID -Could consider cardiac MRI to further evaluate LA findings  5. HLD:  -LDL 138 04/10/2019.  -Pravastatin increased during recent admission -Continue pravastatin for now  6.  AKI: -Baseline creatinine approximately 1.0 -Presenting creatinine, 1.57 today>>1.56 04/18/2019 -Continue to monitor closely   Signed, Kathyrn Drown NP-C [place] Pager: [phone] 04/19/2019, 8:17 AM     For questions or updates, please contact   Please consult [email or web address] for  contact info under Cardiology/STEMI.  Patient seen and examined.  Agree with above documentation.  Patient reports significant improvement in dyspnea after diuresis yesterdat,  She was net negative 1.9L after IV lasix 40 mg BID.  On exam, lungs are CTAB, no LE edema or JVD; cardiac exam is irregular, normal rate, no murmurs.   Labs notable for stable Scr (1.56->1.57), which remains above baseline.  Telemetry personally reviewed, remains in slow AF, HR 60-70s.  For today, received IV lasix 40 mg x1 this morning.  Would hold off on evening dose for now, can titrate as needed for goal 1-2L daily.  May be able to switch to PO lasix tomorrow.  For left atrial mass, will evaluate with cardiac MRI.  She is able to lie flat now and do breath-holds.  There may be some gating challenges given her AF, but rate is well-controlled.  GFR is 35, so can receive gadolinium.  Donato Heinz, MD

## 2019-04-19 NOTE — Progress Notes (Signed)
[facility] PROGRESS NOTE  [name] [name] HEN:[id number] DOB: [birth year] DOA: 04/18/2019 PCP: Cassandria Anger, MD  Assessment/Plan:  Systolic congestive heart failure exacerbation: Acute on chronic.  Patient presented with complaints of worsening shortness of breath and chest discomfort.  Chest x-ray showing cardiomegaly without cute failure.  BNP elevated at 1606.2 which is higher than previous admission.  At discharge just 3 days prior patient was not continued on a diuretic.  Patient was given 40 mg of Lasix IV  in the emergency department and this am. Volume status -1.9L.   evaluated by cardiology who recommended holding this evening dose of IV lasix and transitioning to po tomorrow. Recommend titrating meds as needed for goal of 1-2L per day. In addition recommended cardiac MRI to evaluate left atrial mass.  - Strict I&Os and daily weights - Elevate lower extremities - Reassess in a.m. and adjust diuresis as needed. -appreciate cards assistance  Chest pain: Acute on chronic. Hx of non-obstructive CAD.  On admission troponin negative.  EKG showing atrial fibrillation.  Pulmonary embolus previously evaluated during last admission and less likely as she is on Eliquis. Pain free this am. Evaluated by cards who recommends no further cardiac work up -Follow-up telemetry  Acute kidney injury superimposed on chronic kidney disease stage III: Patient presented with creatinine elevated up to 1.56.  Baseline creatinine previously noted to be around 1 prior to her last admission. Remains at 1.56 this am -Daily monitoring of creatinine while diuresing  Permanent atrial fibrillation: Chronic.  Patient on Eliquis. CHA2DS2-VASc score =5. -Continue Eliquis   Essential hypertension: Blood pressures somewhat soft. Previously on amlodipine and Cozaar, but was discontinued during last hospitalization to start patient on Entresto and metoprolol. -Continue metoprolol  -Holding Entresto per  cardiology  Left atrial mass: To be treated with apixaban and cardiology had previously recommended repeat echocardiogram in approximately 3 months. Evaluated by cards who recommend cardiac MRI. Question thrombus versus mass. -Outpatient follow-up for repeat echocardiogram with cardiology  Hyperlipidemia -Continue pravastatin  GERD -Continue Protonix  Code Status: full Family Communication: patient Disposition Plan: home hopefully tomorrow   Consultants:  [facility] cardiology  Procedures:  Cardiac MRI  Antibiotics:    HPI/Subjective: Awake alert. Denies cp and reports "breathing much better"   Objective: Vitals:   04/19/19 0847 04/19/19 1234  BP: 110/66 98/67  Pulse: 71 82  Resp:  18  Temp: 97.9 F (36.6 C) (!) 97.5 F (36.4 C)  SpO2: 98% 97%    Intake/Output Summary (Last 24 hours) at 04/19/2019 1329 Last data filed at 04/19/2019 1030 Gross per 24 hour  Intake 720 ml  Output 2200 ml  Net -1480 ml   Filed Weights   04/18/19 0724 04/18/19 1631 04/19/19 0605  Weight: 80.7 kg 80.5 kg 83.9 kg    Exam:   General:  Awake alert no acute distress  Cardiovascular: irregularly irregular no mgr no LE edema  Respiratory: normal effort BS clear bilaterally no crackles no wheezes  Abdomen: non-distended non-tender +BS no guarding or rebounding  Musculoskeletal: joints without swelling/erythema   Data Reviewed: Basic Metabolic Panel: Recent Labs  Lab 04/13/19 0459 04/14/19 0620 04/15/19 0543 04/18/19 0605 04/18/19 1818 04/19/19 0421  NA 137 138 139 137  --  137  K 3.7 3.6 3.5 3.7  --  3.8  CL 97* 95* 100 100  --  99  CO2 30 31 29 26   --  28  GLUCOSE 100* 93 95 109*  --  98  BUN 23 23  23 19  --  26*  CREATININE 1.41* 1.48* 1.29* 1.56*  --  1.57*  CALCIUM 9.0 9.1 9.2 9.5  --  9.1  MG  --   --   --   --  1.9 1.8   Liver Function Tests: No results for input(s): AST, ALT, ALKPHOS, BILITOT, PROT, ALBUMIN in the last 168 hours. No results for  input(s): LIPASE, AMYLASE in the last 168 hours. No results for input(s): AMMONIA in the last 168 hours. CBC: Recent Labs  Lab 04/18/19 0605 04/19/19 0421  WBC 4.7 4.7  NEUTROABS  --  0.7*  HGB 14.0 12.9  HCT 43.6 39.2  MCV 85.3 83.8  PLT 226 234   Cardiac Enzymes: No results for input(s): CKTOTAL, CKMB, CKMBINDEX, TROPONINI in the last 168 hours. BNP (last 3 results) Recent Labs    04/09/19 2201 04/18/19 0605  BNP 1,385.5* 1,606.2*    ProBNP (last 3 results) No results for input(s): PROBNP in the last 8760 hours.  CBG: No results for input(s): GLUCAP in the last 168 hours.  Recent Results (from the past 240 hour(s))  SARS Coronavirus 2 [facility] order, Performed in [facility] hospital lab) Nasopharyngeal Nasopharyngeal Swab     Status: None   Collection Time: 04/09/19  9:35 PM   Specimen: Nasopharyngeal Swab  Result Value Ref Range Status   SARS Coronavirus 2 NEGATIVE NEGATIVE Final    Comment: (NOTE) If result is NEGATIVE SARS-CoV-2 target nucleic acids are NOT DETECTED. The SARS-CoV-2 RNA is generally detectable in upper and lower  respiratory specimens during the acute phase of infection. The lowest  concentration of SARS-CoV-2 viral copies this assay can detect is 250  copies / mL. A negative result does not preclude SARS-CoV-2 infection  and should not be used as the sole basis for treatment or other  patient management decisions.  A negative result may occur with  improper specimen collection / handling, submission of specimen other  than nasopharyngeal swab, presence of viral mutation(s) within the  areas targeted by this assay, and inadequate number of viral copies  (<250 copies / mL). A negative result must be combined with clinical  observations, patient history, and epidemiological information. If result is POSITIVE SARS-CoV-2 target nucleic acids are DETECTED. The SARS-CoV-2 RNA is generally detectable in upper and lower  respiratory specimens  dur ing the acute phase of infection.  Positive  results are indicative of active infection with SARS-CoV-2.  Clinical  correlation with patient history and other diagnostic information is  necessary to determine patient infection status.  Positive results do  not rule out bacterial infection or co-infection with other viruses. If result is PRESUMPTIVE POSTIVE SARS-CoV-2 nucleic acids MAY BE PRESENT.   A presumptive positive result was obtained on the submitted specimen  and confirmed on repeat testing.  While 2019 novel coronavirus  (SARS-CoV-2) nucleic acids may be present in the submitted sample  additional confirmatory testing may be necessary for epidemiological  and / or clinical management purposes  to differentiate between  SARS-CoV-2 and other Sarbecovirus currently known to infect humans.  If clinically indicated additional testing with an alternate test  methodology [phone]) is advised. The SARS-CoV-2 RNA is generally  detectable in upper and lower respiratory sp ecimens during the acute  phase of infection. The expected result is Negative. Fact Sheet for Patients:  StrictlyIdeas.no Fact Sheet for Healthcare Providers: BankingDealers.co.za This test is not yet approved or cleared by the [place] FDA and has been authorized for detection and/or  diagnosis of SARS-CoV-2 by FDA under an Emergency Use Authorization (EUA).  This EUA will remain in effect (meaning this test can be used) for the duration of the COVID-19 declaration under Section 564(b)(1) of the Act, 21 U.S.C. section 360bbb-3(b)(1), unless the authorization is terminated or revoked sooner. Performed at [facility], [place] [address], [place], [place] [address]   SARS Coronavirus 2 [facility] order, Performed in [facility] hospital lab) Nasopharyngeal Nasopharyngeal Swab     Status: None   Collection Time: 04/18/19  8:27 AM   Specimen: Nasopharyngeal Swab   Result Value Ref Range Status   SARS Coronavirus 2 NEGATIVE NEGATIVE Final    Comment: (NOTE) If result is NEGATIVE SARS-CoV-2 target nucleic acids are NOT DETECTED. The SARS-CoV-2 RNA is generally detectable in upper and lower  respiratory specimens during the acute phase of infection. The lowest  concentration of SARS-CoV-2 viral copies this assay can detect is 250  copies / mL. A negative result does not preclude SARS-CoV-2 infection  and should not be used as the sole basis for treatment or other  patient management decisions.  A negative result may occur with  improper specimen collection / handling, submission of specimen other  than nasopharyngeal swab, presence of viral mutation(s) within the  areas targeted by this assay, and inadequate number of viral copies  (<250 copies / mL). A negative result must be combined with clinical  observations, patient history, and epidemiological information. If result is POSITIVE SARS-CoV-2 target nucleic acids are DETECTED. The SARS-CoV-2 RNA is generally detectable in upper and lower  respiratory specimens dur ing the acute phase of infection.  Positive  results are indicative of active infection with SARS-CoV-2.  Clinical  correlation with patient history and other diagnostic information is  necessary to determine patient infection status.  Positive results do  not rule out bacterial infection or co-infection with other viruses. If result is PRESUMPTIVE POSTIVE SARS-CoV-2 nucleic acids MAY BE PRESENT.   A presumptive positive result was obtained on the submitted specimen  and confirmed on repeat testing.  While 2019 novel coronavirus  (SARS-CoV-2) nucleic acids may be present in the submitted sample  additional confirmatory testing may be necessary for epidemiological  and / or clinical management purposes  to differentiate between  SARS-CoV-2 and other Sarbecovirus currently known to infect humans.  If clinically indicated additional  testing with an alternate test  methodology [phone]) is advised. The SARS-CoV-2 RNA is generally  detectable in upper and lower respiratory sp ecimens during the acute  phase of infection. The expected result is Negative. Fact Sheet for Patients:  StrictlyIdeas.no Fact Sheet for Healthcare Providers: BankingDealers.co.za This test is not yet approved or cleared by the [place] FDA and has been authorized for detection and/or diagnosis of SARS-CoV-2 by FDA under an Emergency Use Authorization (EUA).  This EUA will remain in effect (meaning this test can be used) for the duration of the COVID-19 declaration under Section 564(b)(1) of the Act, 21 U.S.C. section 360bbb-3(b)(1), unless the authorization is terminated or revoked sooner. Performed at [facility], [place] [address] [place],  [address]      Studies: Dg Chest 2 View  Result Date: 04/18/2019 CLINICAL DATA:  Chest pain EXAM: CHEST - 2 VIEW COMPARISON:  CT from 8 days ago FINDINGS: Cardiomegaly without edema or effusion. No air bronchogram or pneumothorax. No acute osseous finding.  Postoperative left axilla. IMPRESSION: Cardiomegaly without failure. Electronically Signed   By: Neva Seat.D.  On: 04/18/2019 06:34    Scheduled Meds: . apixaban  2.5 mg Oral BID  . diphenhydrAMINE  25 mg Oral QHS  . [START ON 04/20/2019] furosemide  40 mg Intravenous Daily  . magnesium oxide  400 mg Oral Daily  . metoprolol tartrate  12.5 mg Oral BID  . pantoprazole  40 mg Oral BID  . potassium chloride SA  20 mEq Oral BID  . pravastatin  20 mg Oral q1800  . sodium chloride flush  3 mL Intravenous Q12H   Continuous Infusions: . sodium chloride      Principal Problem:   Acute on chronic systolic (congestive) heart failure (HCC) Active Problems:   Essential hypertension   Chest pain   Acute kidney injury superimposed on chronic kidney disease (HCC)   Acute on chronic  heart failure (HCC)   Permanent atrial fibrillation:  CHA2DS2-VASc Score (Eliquis)   Left atrial mass   Hyperlipidemia with target LDL less than 100    Time spent: 40 minutes    [facility] M NP [facility]  If 7PM-7AM, please contact night-coverage at [email or web address], password [facility] 04/19/2019, 1:29 PM  LOS: 0 days

## 2019-04-19 NOTE — Telephone Encounter (Signed)
Still admitted

## 2019-04-19 NOTE — Consult Note (Signed)
   [facility] Inpatient Consult   04/19/2019  [name] [name] 26-Feb-1933 [id number]  Pending active status: Pharmacy assigned  Patient screened for high risk score for unplanned readmission and for less than 7 day readmission with an outreach planned with [place].   Patient in the [employer] [place].   Review of patient's medical record reveals patient is from HPI as follows from 8/17/20202:  HPI: [name] [name] is a 85 y.o. female with medical history significant of permanent atrial fibrillation, systolic congestive heart failure, hypertension, hyperlipidemia, chronic kidney disease stage III, and GERD; who presented with complaints of difficulty breathing over the last 2 days.  Recently hospitalized from 3/7-1/06 for systolic congestive heart failure exacerbation.  Patient was diuresed 8 L and echocardiogram revealed EF of 20-25% with signs of a left atrial mass/thrombus.  Discharged home on medications of metoprolol and Entresto, had not been continued on Lasix.  Plan was for her to follow-up with her PCP on the 19th and cardiology on the 25th of this month.  Since being home she reports being unable to rest at night due to her shortness of breath. Tried utilizing a wedge and pillow without relief of symptoms.  He has been compliant with all of her medications.  Associated symptoms included feelings of leg swelling, dryness of the mouth, and left-sided chest pain.  She reports chest pain is similar to previous.  She had not checked Plotnikov, Evie Lacks, MDweight recently.  Denies having any significant fever, chills, cough, nausea, vomiting, diarrhea, or dysuria symptoms.  Primary Care Provider is   Plotnikov, Evie Lacks, MD [facility] and this office is listed to provide the Transition of Care follow up.  Pharmacy is:  [facility]   Plan: Follow up with inpatient [facility] team for progress and disposition.  [facility] following and pharmacist aware of hospitalization to determine  services needed.   For questions contact:   Natividad Brood, RN BSN [facility] Liaison  [phone] business mobile phone Toll free office [phone]  Fax number: [phone] [email or web address]

## 2019-04-20 ENCOUNTER — Inpatient Hospital Stay: Payer: Medicare Other | Admitting: Internal Medicine

## 2019-04-20 DIAGNOSIS — I4821 Permanent atrial fibrillation: Secondary | ICD-10-CM

## 2019-04-20 DIAGNOSIS — I10 Essential (primary) hypertension: Secondary | ICD-10-CM

## 2019-04-20 DIAGNOSIS — I5023 Acute on chronic systolic (congestive) heart failure: Secondary | ICD-10-CM

## 2019-04-20 LAB — BASIC METABOLIC PANEL
Anion gap: 9 (ref 5–15)
BUN: 32 mg/dL — ABNORMAL HIGH (ref 8–23)
CO2: 28 mmol/L (ref 22–32)
Calcium: 9.1 mg/dL (ref 8.9–10.3)
Chloride: 101 mmol/L (ref 98–111)
Creatinine, Ser: 1.47 mg/dL — ABNORMAL HIGH (ref 0.44–1.00)
GFR calc Af Amer: 38 mL/min — ABNORMAL LOW (ref >60)
GFR calc non Af Amer: 32 mL/min — ABNORMAL LOW (ref >60)
Glucose, Bld: 86 mg/dL (ref 70–99)
Potassium: 4.1 mmol/L (ref 3.5–5.1)
Sodium: 138 mmol/L (ref 135–145)

## 2019-04-20 MED ORDER — FUROSEMIDE 40 MG PO TABS
40.0000 mg | ORAL_TABLET | Freq: Every day | ORAL | Status: DC
  Administered 2019-04-20 – 2019-04-21 (×2): 40 mg via ORAL
  Filled 2019-04-20 (×2): qty 1

## 2019-04-20 MED ORDER — METOPROLOL SUCCINATE ER 25 MG PO TB24
25.0000 mg | ORAL_TABLET | Freq: Every day | ORAL | Status: DC
  Administered 2019-04-21: 09:00:00 25 mg via ORAL
  Filled 2019-04-20: qty 1

## 2019-04-20 NOTE — Plan of Care (Signed)

## 2019-04-20 NOTE — Progress Notes (Signed)
Progress Note  Patient Name: [name] [name] Date of Encounter: 04/20/2019  Primary Cardiologist: Glenetta Hew, MD  Subjective   Pt reports significant improvement in dyspnea, states that she feels close to baseline.  Inpatient Medications    Scheduled Meds:  apixaban  2.5 mg Oral BID   diphenhydrAMINE  25 mg Oral QHS   furosemide  40 mg Oral Daily   metoprolol tartrate  12.5 mg Oral BID   pantoprazole  40 mg Oral BID   potassium chloride SA  20 mEq Oral BID   pravastatin  20 mg Oral q1800   sodium chloride flush  3 mL Intravenous Q12H   Continuous Infusions:  sodium chloride     PRN Meds: sodium chloride, acetaminophen, albuterol, ondansetron (ZOFRAN) IV, sodium chloride flush, zolpidem   Vital Signs    Vitals:   04/20/19 0042 04/20/19 0530 04/20/19 1037 04/20/19 1124  BP: 98/62 (!) 105/55 100/63 100/67  Pulse: (!) 59 63 (!) 55 76  Resp: 15 18 18 16   Temp: 98.1 F (36.7 C) 98.1 F (36.7 C) 97.8 F (36.6 C) 97.7 F (36.5 C)  TempSrc: Oral  Oral Oral  SpO2: 96% 98% 98% 100%  Weight:  81.2 kg    Height:        Intake/Output Summary (Last 24 hours) at 04/20/2019 1626 Last data filed at 04/20/2019 1323 Gross per 24 hour  Intake 627 ml  Output 550 ml  Net 77 ml   Filed Weights   04/18/19 1631 04/19/19 0605 04/20/19 0530  Weight: 80.5 kg 83.9 kg 81.2 kg    Physical Exam   General: Elderly, NAD Neck: No JVD Lungs:Clear to ausculation bilaterally. No crackles Cardiovascular: Irregularly irregular with S1 S2. Slow rate.  No murmurs. Abdomen: Soft, non-tender, non-distended.  Extremities: No edema.  Neuro: Alert and oriented. No focal deficits Psych: Responds to questions appropriately with normal affect.    Labs    Chemistry Recent Labs  Lab 04/18/19 0605 04/19/19 0421 04/20/19 0354  NA 137 137 138  K 3.7 3.8 4.1  CL 100 99 101  CO2 26 28 28   GLUCOSE 109* 98 86  BUN 19 26* 32*  CREATININE 1.56* 1.57* 1.47*  CALCIUM 9.5 9.1 9.1   GFRNONAA 30* 30* 32*  GFRAA 35* 35* 38*  ANIONGAP 11 10 9      Hematology Recent Labs  Lab 04/18/19 0605 04/19/19 0421  WBC 4.7 4.7  RBC 5.11 4.68  HGB 14.0 12.9  HCT 43.6 39.2  MCV 85.3 83.8  MCH 27.4 27.6  MCHC 32.1 32.9  RDW 15.1 15.1  PLT 226 234    Cardiac EnzymesNo results for input(s): TROPONINI in the last 168 hours. No results for input(s): TROPIPOC in the last 168 hours.   BNP Recent Labs  Lab 04/18/19 0605  BNP 1,606.2*     DDimer No results for input(s): DDIMER in the last 168 hours.   Radiology    Mr Cardiac Morphology W Wo Contrast  Result Date: 04/19/2019 CLINICAL DATA:  58F with HFrEF, evaluate possible left atrial mass seen on TTE EXAM: CARDIAC MRI TECHNIQUE: The patient was scanned on a 1.5 Tesla GE magnet. A dedicated cardiac coil was used. Functional imaging was done using Fiesta sequences. 2,3, and 4 chamber views were done to assess for RWMA's. Modified Simpson's rule using a short axis stack was used to calculate an ejection fraction on a dedicated work Conservation officer, nature. The patient received 7 cc of Gadavist. Inversion  recovery sequences were used to assess for infiltration and scar tissue. CONTRAST:  7 cc  of Gadavist FINDINGS: Left ventricle: Severe dilatation. Severe global systolic dysfunction. Basal septal midwall stripe of late gadolinium enhancement. LV EF:  23 % (Normal 56-78%) Absolute volumes: LV EDV:          270 mL (Normal 52-141 mL) LV ESV:           208 mL (Normal 13-51 mL) LV SV:   62  mL (Normal 33-97 mL) CO:    4.1  L/min (Normal 2.7-6.0 L/min) Indexed volumes LV EDV:           137 mL/sq-m (Normal 41-81 mL/sq-m) LV ESV:            105 mL/sq-m (Normal 12-21 mL/sq-m) LV SV:  31 mL/sq-m (Normal 26-56 mL/sq-m) CI: 2.0  L/min/sq-m (Normal 1.8-3.8 L/min/sq-m) Right ventricle: Mild dilatation.  Moderate systolic dysfunction. RV EF: 33% (Normal 47-80%) Absolute volumes: RV EDV:           184 mL (Normal 58-154 mL) RV ESV:           123  mL (Normal 12-68 mL) RV SV:    61 mL (Normal 35-98 mL) CO: 4.1  L/min (Normal 2.7-6 L/min) Indexed volumes RV EDV:           93 mL/sq-m (Normal 48-87 mL/sq-m) RV ESV:           62 mL/sq-m (Normal 11-28 mL/sq-m) RV SV:  31 mL/sq-m (Normal 27-57 mL/sq-m) CI: 2.0  L/min/sq-m (Normal 1.8-3.8 L/min/sq-m) Left atrium: Severe enlargement.  No mass visualized Right atrium: Moderate enlargement Mitral valve: Mild to moderate regurgitation Aortic valve: No regurgitation Tricuspid valve: Mild to moderate regurgitation Pulmonic valve: Not visualized Aorta: Normal proximal ascending aorta Pericardium: No effusion IMPRESSION: 1. No left atrial mass visualized 2. Severe LV dilatation with severe global systolic dysfunction (EF 25%) 3. Mild RV dilatation with moderate systolic dysfunction (EF 05%) 4. Basal LV septal midwall stripe of late gadolinium enhancement. This scar pattern is seen in nonischemic cardiomyopathies and portends a worse prognosis Oswaldo Milian, MD Electronically Signed   By: Oswaldo Milian MD   On: 04/19/2019 20:54    Telemetry    Personally Reviewed: AF with rates 60-70s  ECG    04/18/2019 AF with LBBB and HR 48bpm- Personally Reviewed  Cardiac Studies   Echocardiogram 04/10/2019: 1. The left ventricle has severely reduced systolic function, with an ejection fraction of 20-25%. The cavity size was mildly dilated. Left ventricular diastolic function could not be evaluated secondary to atrial fibrillation. Elevated left atrial and  left ventricular end-diastolic pressures Left ventricular diffuse hypokinesis. 2. There is akinesis of the basal and mid infero and anteroseptum, apical septum, apical anterior and apical inferolaterl akinesis. 3. The right ventricle has moderately reduced systolic function. The cavity was severely enlarged. There is no increase in right ventricular wall thickness. Right ventricular systolic pressure is moderately elevated at 35mmhg. 4. Left atrial  size was severely dilated. 5. There is a 2.3 x 1.43cm mass in the posterior LA of uncler significance. Cannot rule out thrombus. Consider TEE if clinically indicated. 6. Right atrial size was severely dilated. 7. Mild thickening of the mitral valve leaflet. Mitral valve regurgitation is mild to moderate by color flow Doppler. 8. The aortic valve is tricuspid. Moderate sclerosis of the aortic valve. Aortic valve regurgitation was not assessed by color flow Doppler. Moderate aortic annular calcification noted. 9. Tricuspid valve regurgitation is moderate. 10. The  aorta is normal in size and structure. 11. The inferior vena cava was dilated in size with <50% respiratory variability.  CMR personally reviewed: 1. No left atrial mass visualized 2. Severe LV dilatation with severe global systolic dysfunction (EF 50%) 3. Mild RV dilatation with moderate systolic dysfunction (EF 03%) 4. Basal LV septal midwall stripe of late gadolinium enhancement. This scar pattern is seen in nonischemic cardiomyopathies and portends a worse prognosis  Patient Profile     85 y.o. female with a PMH of chronic combined CHF, permanent atrial fibrillation, non-obstructive CAD noted on coronary CTA 09/2018, LA mass c/f thrombus on echo 8/20202, HTN, HLD, CKD stage 3, and remote breast cancer who is being seen today for the evaluation of CHF at the request of Dr. Tamala Julian.  Assessment & Plan    1. Acute on chronic combined CHF: Admitted with recurrent shortness of breath and orthopnea after recent admitted 8/8-8/14/2020 for acute combined CHF. Echocardiogram at that time with LVEF of 20 to 25%, she was diuresed however Lasix was held at discharge as she was felt to be dry.  Presented with recurrent symptoms with BNP in the 1600s from 1300s last week. Improving with diuresis -Entresto held given AKI -Appears euvolemic, will switch to PO lasix 40 mg daily today -Switch from lopressor to toprol XL  2. Chest pain in  patient with non-obstructive CAD: Had a recent chest pain, noted to be chronic>>denies symptoms currently.  Coronary CTA 09/2018 with nonobstructive disease.  EKG on presentation without ischemia and HsT not consistent with ACS -No further cardiac work-up at this time.   3. Permanent atrial fibrillation: Rate controlled on beta-blocker, HR 60's  -Switch to toprol XL -Continue Eliquis for stroke ppx given CHA2DS2-VASc Score of 5 (CHF, Vasc, Female, Age >75)  4. ? LA mass: seen on TTE; CMR did not show a mass  5. HLD: LDL 138 04/10/2019. Pravastatin increased during recent admission -Continue pravastatin for now  6.  AKI: Baseline creatinine approximately 1.0, presented 1.56.  Improved to 1.47 today -Continue to monitor closely    Donato Heinz, MD

## 2019-04-20 NOTE — TOC Initial Note (Signed)
Transition of Care [facility]) - Initial/Assessment Note    Patient Details  Name: [name] [name] MRN: [id number] Date of Birth: [birth year]  Transition of Care [facility]) CM/SW Contact:    Zenon Mayo, RN Phone Number: 04/20/2019, 4:28 PM  Clinical Narrative:                 Patient is from home with sister, she states she still drives and she gets out a lot.  She has no issues with getting medications.  She has a PCP.  She would like HHRN for CHF disease Management, NCM informed her with her insurance it is hard to get [facility] agency , she states she does not have a perferance of which agency to use from the list.  NCM made referral to [facility], they were able to take referral. Soc will begin 24-48 hrs post dc. , Need HHRN order with face to face.  Expected Discharge Plan: [place] Barriers to Discharge: No Barriers Identified   Patient Goals and CMS Choice Patient states their goals for this hospitalization and ongoing recovery are:: get better CMS Medicare.gov Compare Post Acute Care list provided to:: Patient Choice offered to / list presented to : Patient  Expected Discharge Plan and Services Expected Discharge Plan: [place] In-house Referral: NA Discharge Planning Services: CM Consult Post Acute Care Choice: [place] arrangements for the past 2 months: Single Family Home                 DME Arranged: (NA)         HH Arranged: RN [place] Agency: [facility] [facility] [employer] Date [place]: 04/20/19 Time [place]: [phone] Representative spoke with at [place]: [place] Arrangements/Services Living arrangements for the past 2 months: [place] with:: Siblings Patient language and need for interpreter reviewed:: Yes Do you feel safe going back to the place where you live?: Yes      Need for Family Participation in Patient Care: No (Comment) Care giver support system in  place?: No (comment)   Criminal Activity/Legal Involvement Pertinent to Current Situation/Hospitalization: No - Comment as needed  Activities of Daily Living Home Assistive Devices/Equipment: Cane (specify quad or straight), Eyeglasses ADL Screening (condition at time of admission) Patient's cognitive ability adequate to safely complete daily activities?: Yes Is the patient deaf or have difficulty hearing?: No Does the patient have difficulty seeing, even when wearing glasses/contacts?: No Does the patient have difficulty concentrating, remembering, or making decisions?: No Patient able to express need for assistance with ADLs?: Yes Does the patient have difficulty dressing or bathing?: No Independently performs ADLs?: Yes (appropriate for developmental age) Does the patient have difficulty walking or climbing stairs?: Yes Weakness of Legs: Both Weakness of Arms/Hands: None  Permission Sought/Granted                  Emotional Assessment Appearance:: Appears stated age Attitude/Demeanor/Rapport: Gracious Affect (typically observed): Appropriate Orientation: : Oriented to Self, Oriented to Place, Oriented to  Time, Oriented to Situation Alcohol / Substance Use: Not Applicable Psych Involvement: No (comment)  Admission diagnosis:  SOB (shortness of breath) [R06.02] Patient Active Problem List   Diagnosis Date Noted  . Acute on chronic heart failure ([place]) 04/19/2019  . Acute on chronic systolic (congestive) heart failure ([place]) 04/18/2019  . Acute kidney injury superimposed on chronic kidney disease ([place]) 04/18/2019  . Left atrial mass   .  Abnormal echocardiogram   . Unintentional weight loss 04/10/2019  . Chest pain 04/09/2019  . Elevated troponin 04/09/2019  . CKD (chronic kidney disease), stage III ([place]) 04/09/2019  . Acute on chronic systolic CHF (congestive heart failure) ([place]) 03/08/2019  . Shortness of breath 02/28/2019  . Edema of both lower extremities 02/28/2019   . Dysphagia 09/28/2018  . Anosmia 08/18/2018  . Sweats, menopausal 08/18/2018  . Urinary incontinence 08/18/2018  . UTI (urinary tract infection) due to Enterococcus 08/06/2018  . Chest discomfort 08/04/2018  . Murmur, cardiac 07/28/2018  . Permanent atrial fibrillation:  CHA2DS2-VASc Score (Eliquis) 07/28/2018  . Sciatica associated with disorder of lumbar spine 02/26/2018  . Hip pain, chronic, left 02/26/2018  . Acute pharyngitis 07/13/2017  . Actinic keratosis 07/13/2017  . Wart viral 07/01/2017  . Edema 12/19/2016  . Right leg pain 11/03/2016  . Knee pain, left 07/17/2016  . Bell palsy 10/22/2015  . Taste sense altered 10/22/2015  . Mouth droop due to facial weakness 10/12/2015  . Sialoadenitis of submandibular gland 10/04/2015  . Fatigue 10/04/2015  . Cold intolerance 04/23/2015  . Headache 12/29/2014  . Total knee replacement status 05/24/2014  . Lymphedema of upper extremity following lymphadenectomy 05/22/2014  . Preop exam for internal medicine 05/22/2014  . Breast cancer ([place]) 03/17/2014  . Chronic cholecystitis 11/15/2013  . Abnormal gallbladder x-ray 11/08/2013  . Abdominal tenderness of left lower quadrant 11/02/2013  . Diverticulitis of colon without hemorrhage 11/02/2013  . Hematuria 11/02/2013  . Hypokalemia 11/02/2013  . Obstructive chronic bronchitis with exacerbation ([place]) 09/05/2013  . Mass of right forearm 02/07/2013  . Well adult exam 02/04/2013  . Lipoma of forearm 02/04/2013  . Mass of left side of neck 01/06/2012  . Diverticulitis 10/10/2011  . Nausea & vomiting 04/16/2011  . Chills 04/16/2011  . LLQ abdominal pain 04/16/2011  . Abdominal pain, epigastric 08/14/2010  . GERD 06/27/2010  . SKIN CANCER, HX OF 01/31/2010  . CHILLS WITHOUT FEVER 05/04/2009  . Rash and nonspecific skin eruption 11/21/2008  . Herpes zoster 10/04/2008  . PARESTHESIA 10/04/2008  . GRIEF REACTION 08/15/2008  . Depression 08/15/2008  . Osteoarthritis 08/15/2008  .  HYPOKALEMIA 12/22/2007  . Weight loss 10/21/2007  . Pain in Soft Tissues of Limb 06/21/2007  . B12 deficiency 03/27/2007  . Vitamin D deficiency 03/27/2007  . Hyperlipidemia with target LDL less than 100 03/27/2007  . ANEMIA-IRON DEFICIENCY 03/27/2007  . Insomnia 03/27/2007  . Essential hypertension 03/27/2007  . BRONCHITIS, ACUTE 03/27/2007  . DIVERTICULOSIS, COLON 03/27/2007   PCP:  Cassandria Anger, MD Pharmacy:   [facility] [address] [place] [address] Phone: [phone] Fax: [phone]     Social Determinants of Health (SDOH) Interventions    Readmission Risk Interventions Readmission Risk Prevention Plan 04/20/2019  Transportation Screening Complete  HRI or [place] Complete  Social Work Consult for [place] Planning/Counseling Complete  Palliative Care Screening Not Applicable  Medication Review Press photographer) Complete  Some recent data might be hidden

## 2019-04-20 NOTE — Progress Notes (Signed)
[facility]  PROGRESS NOTE  [name] [name] ZES:[id number] DOB: 01/25/1933 DOA: 04/18/2019 PCP: Cassandria Anger, MD  Brief History    [name] [name] is a 85 y.o. year old female with medical history significant for chronic combined CHF, permanent atrial fibrillation, non-obstructive CAD noted on coronary CTA 09/2018, LA mass c/f thrombus on echo 8/20202, HTN, HLD, CKD stage 3, and remote breast cancer who presented on 04/18/2019 with shortness of breath, orthopnea and was found to have acute exacerbation of combined CHF.  Presume symptoms secondary to patient not resuming home Lasix after recent admission for CHF flare (Lasix was held on discharge as patient was felt to be dry).  A & P  Acute on chronic CHF with reduced ejection fraction (EF 20-25%) exacerbation, improvement in shortness of breath, net -1.5 L, looks euvolemic on exam, plan to transition to oral Lasix today and monitor dosage over 24 hours, continue to monitor weights,  wgt 179 (175 on discharge on 7/15)  Left atrial mass.  Plan for cardiac MRI for evaluation.  Unclear if artifact from echo versus thrombus.  Continue apixaban  Chest pain.  History of nonobstructive CAD on coronary CTA on 09/2018.  EKG on admission nonischemic  AKI, suspect prerenal in setting of CHF flare.  Baseline 1, currently 1.4, avoid nephrotoxins, monitor, currently holding Entresto  Atrial fibrillation, currently rate controlled.  Continue metoprolol, Eliquis.     DVT prophylaxis: Eliquis Code Status: Full code Family Communication: No family at bedside Disposition Plan: Monitor over next 24 hours on oral Lasix, anticipate discharge on 8/20    [facility] Direct contact: see [email or web address] (further directions at bottom of note if needed) 7PM-7AM contact night coverage as at bottom of note 04/20/2019, 9:08 AM  LOS: 1 day   Consultants  . Cardiology  Procedures  . Cardiac MRI, 8/19  Antibiotics  . None  Interval  History/Subjective  Feels breathing is much improved.  Denies any chest pain.  Objective   Vitals:  Vitals:   04/20/19 0042 04/20/19 0530  BP: 98/62 (!) 105/55  Pulse: (!) 59 63  Resp: 15 18  Temp: 98.1 F (36.7 C) 98.1 F (36.7 C)  SpO2: 96% 98%    Exam:  Awake Alert, Oriented X 3, No new F.N deficits, Normal affect [place].AT,PERRAL Supple Neck,No JVD appreciated Symmetrical Chest wall movement, Good air movement bilaterally, CTAB RRR,No Gallops,Rubs or new Murmurs, +ve B.Sounds, Abd Soft, No tenderness, No organomegaly appriciated, No rebound - guarding or rigidity. No Cyanosis, Clubbing or edema, No new Rash or bruise    I have personally reviewed the following:   Data Reviewed: Basic Metabolic Panel: Recent Labs  Lab 04/14/19 0620 04/15/19 0543 04/18/19 0605 04/18/19 1818 04/19/19 0421 04/20/19 0354  NA 138 139 137  --  137 138  K 3.6 3.5 3.7  --  3.8 4.1  CL 95* 100 100  --  99 101  CO2 31 29 26   --  28 28  GLUCOSE 93 95 109*  --  98 86  BUN 23 23 19   --  26* 32*  CREATININE 1.48* 1.29* 1.56*  --  1.57* 1.47*  CALCIUM 9.1 9.2 9.5  --  9.1 9.1  MG  --   --   --  1.9 1.8  --    Liver Function Tests: No results for input(s): AST, ALT, ALKPHOS, BILITOT, PROT, ALBUMIN in the last 168 hours. No results for input(s): LIPASE, AMYLASE in the last 168 hours. No results for input(s):  AMMONIA in the last 168 hours. CBC: Recent Labs  Lab 04/18/19 0605 04/19/19 0421  WBC 4.7 4.7  NEUTROABS  --  0.7*  HGB 14.0 12.9  HCT 43.6 39.2  MCV 85.3 83.8  PLT 226 234   Cardiac Enzymes: No results for input(s): CKTOTAL, CKMB, CKMBINDEX, TROPONINI in the last 168 hours. BNP (last 3 results) Recent Labs    04/09/19 2201 04/18/19 0605  BNP 1,385.5* 1,606.2*    ProBNP (last 3 results) No results for input(s): PROBNP in the last 8760 hours.  CBG: No results for input(s): GLUCAP in the last 168 hours.  Recent Results (from the past 240 hour(s))  SARS Coronavirus 2  [facility] order, Performed in [facility] hospital lab) Nasopharyngeal Nasopharyngeal Swab     Status: None   Collection Time: 04/18/19  8:27 AM   Specimen: Nasopharyngeal Swab  Result Value Ref Range Status   SARS Coronavirus 2 NEGATIVE NEGATIVE Final    Comment: (NOTE) If result is NEGATIVE SARS-CoV-2 target nucleic acids are NOT DETECTED. The SARS-CoV-2 RNA is generally detectable in upper and lower  respiratory specimens during the acute phase of infection. The lowest  concentration of SARS-CoV-2 viral copies this assay can detect is 250  copies / mL. A negative result does not preclude SARS-CoV-2 infection  and should not be used as the sole basis for treatment or other  patient management decisions.  A negative result may occur with  improper specimen collection / handling, submission of specimen other  than nasopharyngeal swab, presence of viral mutation(s) within the  areas targeted by this assay, and inadequate number of viral copies  (<250 copies / mL). A negative result must be combined with clinical  observations, patient history, and epidemiological information. If result is POSITIVE SARS-CoV-2 target nucleic acids are DETECTED. The SARS-CoV-2 RNA is generally detectable in upper and lower  respiratory specimens dur ing the acute phase of infection.  Positive  results are indicative of active infection with SARS-CoV-2.  Clinical  correlation with patient history and other diagnostic information is  necessary to determine patient infection status.  Positive results do  not rule out bacterial infection or co-infection with other viruses. If result is PRESUMPTIVE POSTIVE SARS-CoV-2 nucleic acids MAY BE PRESENT.   A presumptive positive result was obtained on the submitted specimen  and confirmed on repeat testing.  While 2019 novel coronavirus  (SARS-CoV-2) nucleic acids may be present in the submitted sample  additional confirmatory testing may be necessary for  epidemiological  and / or clinical management purposes  to differentiate between  SARS-CoV-2 and other Sarbecovirus currently known to infect humans.  If clinically indicated additional testing with an alternate test  methodology [phone]) is advised. The SARS-CoV-2 RNA is generally  detectable in upper and lower respiratory sp ecimens during the acute  phase of infection. The expected result is Negative. Fact Sheet for Patients:  StrictlyIdeas.no Fact Sheet for Healthcare Providers: BankingDealers.co.za This test is not yet approved or cleared by the [place] FDA and has been authorized for detection and/or diagnosis of SARS-CoV-2 by FDA under an Emergency Use Authorization (EUA).  This EUA will remain in effect (meaning this test can be used) for the duration of the COVID-19 declaration under Section 564(b)(1) of the Act, 21 U.S.C. section 360bbb-3(b)(1), unless the authorization is terminated or revoked sooner. Performed at [facility], [place] [address], [place], [place] [address]      Studies: Mr Cardiac Morphology W Wo Contrast  Result Date:  04/19/2019 CLINICAL DATA:  55F with HFrEF, evaluate possible left atrial mass seen on TTE EXAM: CARDIAC MRI TECHNIQUE: The patient was scanned on a 1.5 Tesla GE magnet. A dedicated cardiac coil was used. Functional imaging was done using Fiesta sequences. 2,3, and 4 chamber views were done to assess for RWMA's. Modified Simpson's rule using a short axis stack was used to calculate an ejection fraction on a dedicated work Conservation officer, nature. The patient received 7 cc of Gadavist. Inversion recovery sequences were used to assess for infiltration and scar tissue. CONTRAST:  7 cc  of Gadavist FINDINGS: Left ventricle: Severe dilatation. Severe global systolic dysfunction. Basal septal midwall stripe of late gadolinium enhancement. LV EF:  23 % (Normal 56-78%) Absolute volumes: LV EDV:           270 mL (Normal 52-141 mL) LV ESV:           208 mL (Normal 13-51 mL) LV SV:   62  mL (Normal 33-97 mL) CO:    4.1  L/min (Normal 2.7-6.0 L/min) Indexed volumes LV EDV:           137 mL/sq-m (Normal 41-81 mL/sq-m) LV ESV:            105 mL/sq-m (Normal 12-21 mL/sq-m) LV SV:  31 mL/sq-m (Normal 26-56 mL/sq-m) CI: 2.0  L/min/sq-m (Normal 1.8-3.8 L/min/sq-m) Right ventricle: Mild dilatation.  Moderate systolic dysfunction. RV EF: 33% (Normal 47-80%) Absolute volumes: RV EDV:           184 mL (Normal 58-154 mL) RV ESV:           123 mL (Normal 12-68 mL) RV SV:    61 mL (Normal 35-98 mL) CO: 4.1  L/min (Normal 2.7-6 L/min) Indexed volumes RV EDV:           93 mL/sq-m (Normal 48-87 mL/sq-m) RV ESV:           62 mL/sq-m (Normal 11-28 mL/sq-m) RV SV:  31 mL/sq-m (Normal 27-57 mL/sq-m) CI: 2.0  L/min/sq-m (Normal 1.8-3.8 L/min/sq-m) Left atrium: Severe enlargement.  No mass visualized Right atrium: Moderate enlargement Mitral valve: Mild to moderate regurgitation Aortic valve: No regurgitation Tricuspid valve: Mild to moderate regurgitation Pulmonic valve: Not visualized Aorta: Normal proximal ascending aorta Pericardium: No effusion IMPRESSION: 1. No left atrial mass visualized 2. Severe LV dilatation with severe global systolic dysfunction (EF 57%) 3. Mild RV dilatation with moderate systolic dysfunction (EF 84%) 4. Basal LV septal midwall stripe of late gadolinium enhancement. This scar pattern is seen in nonischemic cardiomyopathies and portends a worse prognosis Oswaldo Milian, MD Electronically Signed   By: Oswaldo Milian MD   On: 04/19/2019 20:54    Scheduled Meds: . apixaban  2.5 mg Oral BID  . diphenhydrAMINE  25 mg Oral QHS  . magnesium oxide  400 mg Oral Daily  . metoprolol tartrate  12.5 mg Oral BID  . pantoprazole  40 mg Oral BID  . potassium chloride SA  20 mEq Oral BID  . pravastatin  20 mg Oral q1800  . sodium chloride flush  3 mL Intravenous Q12H   Continuous Infusions: .  sodium chloride      Principal Problem:   Acute on chronic systolic (congestive) heart failure (HCC) Active Problems:   Hyperlipidemia with target LDL less than 100   Essential hypertension   Permanent atrial fibrillation:  CHA2DS2-VASc Score (Eliquis)   Chest pain   Acute kidney injury superimposed on chronic kidney disease ([place])   Left  atrial mass   Acute on chronic heart failure ([place])      Desiree Hane  [facility]

## 2019-04-20 NOTE — TOC Initial Note (Addendum)
Transition of Care [facility]) - Initial/Assessment Note    Patient Details  Name: [name] [name] MRN: [id number] Date of Birth: 03/14/1933  [facility] CM/SW Contact:    Zenon Mayo, RN Phone Number: 04/20/2019, 10:05 AM  Clinical Narrative:                 Patient is from home with sister, she states she still drives and she gets out a lot.  She has no issues with getting medications.  She has a PCP.  She would like HHRN for CHF disease Management, NCM informed her with her insurance it is hard to get [facility] agency , she states she does not have a perferance of which agency to use from the list.  NCM made referral to [facility], they were able to take referral. Soc will begin 24-48 hrs post dc.   Expected Discharge Plan: [place] Barriers to Discharge: No Barriers Identified   Patient Goals and CMS Choice Patient states their goals for this hospitalization and ongoing recovery are:: get better CMS Medicare.gov Compare Post Acute Care list provided to:: Patient Choice offered to / list presented to : Parent  Expected Discharge Plan and Services Expected Discharge Plan: [place] In-house Referral: NA Discharge Planning Services: CM Consult Post Acute Care Choice: [place] arrangements for the past 2 months: Single Family Home                 DME Arranged: (NA)                    Prior Living Arrangements/Services Living arrangements for the past 2 months: Single Family Home Lives with:: Siblings Patient language and need for interpreter reviewed:: Yes Do you feel safe going back to the place where you live?: Yes      Need for Family Participation in Patient Care: No (Comment) Care giver support system in place?: No (comment)   Criminal Activity/Legal Involvement Pertinent to Current Situation/Hospitalization: No - Comment as needed  Activities of Daily Living Home Assistive Devices/Equipment: Cane (specify  quad or straight), Eyeglasses ADL Screening (condition at time of admission) Patient's cognitive ability adequate to safely complete daily activities?: Yes Is the patient deaf or have difficulty hearing?: No Does the patient have difficulty seeing, even when wearing glasses/contacts?: No Does the patient have difficulty concentrating, remembering, or making decisions?: No Patient able to express need for assistance with ADLs?: Yes Does the patient have difficulty dressing or bathing?: No Independently performs ADLs?: Yes (appropriate for developmental age) Does the patient have difficulty walking or climbing stairs?: Yes Weakness of Legs: Both Weakness of Arms/Hands: None  Permission Sought/Granted                  Emotional Assessment Appearance:: Appears stated age Attitude/Demeanor/Rapport: Gracious Affect (typically observed): Appropriate Orientation: : Oriented to Self, Oriented to Place, Oriented to  Time, Oriented to Situation Alcohol / Substance Use: Not Applicable Psych Involvement: No (comment)  Admission diagnosis:  SOB (shortness of breath) [R06.02] Patient Active Problem List   Diagnosis Date Noted  . Acute on chronic heart failure ([place]) 04/19/2019  . Acute on chronic systolic (congestive) heart failure ([place]) 04/18/2019  . Acute kidney injury superimposed on chronic kidney disease ([place]) 04/18/2019  . Left atrial mass   . Abnormal echocardiogram   . Unintentional weight loss 04/10/2019  . Chest pain 04/09/2019  . Elevated troponin 04/09/2019  . CKD (chronic kidney  disease), stage III ([place]) 04/09/2019  . Acute on chronic systolic CHF (congestive heart failure) ([place]) 03/08/2019  . Shortness of breath 02/28/2019  . Edema of both lower extremities 02/28/2019  . Dysphagia 09/28/2018  . Anosmia 08/18/2018  . Sweats, menopausal 08/18/2018  . Urinary incontinence 08/18/2018  . UTI (urinary tract infection) due to Enterococcus 08/06/2018  . Chest discomfort  08/04/2018  . Murmur, cardiac 07/28/2018  . Permanent atrial fibrillation:  CHA2DS2-VASc Score (Eliquis) 07/28/2018  . Sciatica associated with disorder of lumbar spine 02/26/2018  . Hip pain, chronic, left 02/26/2018  . Acute pharyngitis 07/13/2017  . Actinic keratosis 07/13/2017  . Wart viral 07/01/2017  . Edema 12/19/2016  . Right leg pain 11/03/2016  . Knee pain, left 07/17/2016  . Bell palsy 10/22/2015  . Taste sense altered 10/22/2015  . Mouth droop due to facial weakness 10/12/2015  . Sialoadenitis of submandibular gland 10/04/2015  . Fatigue 10/04/2015  . Cold intolerance 04/23/2015  . Headache 12/29/2014  . Total knee replacement status 05/24/2014  . Lymphedema of upper extremity following lymphadenectomy 05/22/2014  . Preop exam for internal medicine 05/22/2014  . Breast cancer ([place]) 03/17/2014  . Chronic cholecystitis 11/15/2013  . Abnormal gallbladder x-ray 11/08/2013  . Abdominal tenderness of left lower quadrant 11/02/2013  . Diverticulitis of colon without hemorrhage 11/02/2013  . Hematuria 11/02/2013  . Hypokalemia 11/02/2013  . Obstructive chronic bronchitis with exacerbation ([place]) 09/05/2013  . Mass of right forearm 02/07/2013  . Well adult exam 02/04/2013  . Lipoma of forearm 02/04/2013  . Mass of left side of neck 01/06/2012  . Diverticulitis 10/10/2011  . Nausea & vomiting 04/16/2011  . Chills 04/16/2011  . LLQ abdominal pain 04/16/2011  . Abdominal pain, epigastric 08/14/2010  . GERD 06/27/2010  . SKIN CANCER, HX OF 01/31/2010  . CHILLS WITHOUT FEVER 05/04/2009  . Rash and nonspecific skin eruption 11/21/2008  . Herpes zoster 10/04/2008  . PARESTHESIA 10/04/2008  . GRIEF REACTION 08/15/2008  . Depression 08/15/2008  . Osteoarthritis 08/15/2008  . HYPOKALEMIA 12/22/2007  . Weight loss 10/21/2007  . Pain in Soft Tissues of Limb 06/21/2007  . B12 deficiency 03/27/2007  . Vitamin D deficiency 03/27/2007  . Hyperlipidemia with target LDL less than  100 03/27/2007  . ANEMIA-IRON DEFICIENCY 03/27/2007  . Insomnia 03/27/2007  . Essential hypertension 03/27/2007  . BRONCHITIS, ACUTE 03/27/2007  . DIVERTICULOSIS, COLON 03/27/2007   PCP:  Cassandria Anger, MD Pharmacy:   [facility] [address], [place] [address] Phone: [phone] Fax: [phone]     Social Determinants of Health (SDOH) Interventions    Readmission Risk Interventions Readmission Risk Prevention Plan 04/20/2019  Transportation Screening Complete  HRI or [place] Complete  Social Work Consult for [place] Planning/Counseling Complete  Palliative Care Screening Not Applicable  Medication Review Press photographer) Complete  Some recent data might be hidden

## 2019-04-20 NOTE — Telephone Encounter (Signed)
Still admitted

## 2019-04-21 DIAGNOSIS — E785 Hyperlipidemia, unspecified: Secondary | ICD-10-CM

## 2019-04-21 DIAGNOSIS — R079 Chest pain, unspecified: Secondary | ICD-10-CM

## 2019-04-21 DIAGNOSIS — R0602 Shortness of breath: Secondary | ICD-10-CM

## 2019-04-21 DIAGNOSIS — N179 Acute kidney failure, unspecified: Secondary | ICD-10-CM

## 2019-04-21 DIAGNOSIS — N189 Chronic kidney disease, unspecified: Secondary | ICD-10-CM

## 2019-04-21 DIAGNOSIS — I5189 Other ill-defined heart diseases: Secondary | ICD-10-CM

## 2019-04-21 LAB — BASIC METABOLIC PANEL
Anion gap: 9 (ref 5–15)
BUN: 32 mg/dL — ABNORMAL HIGH (ref 8–23)
CO2: 28 mmol/L (ref 22–32)
Calcium: 9.3 mg/dL (ref 8.9–10.3)
Chloride: 101 mmol/L (ref 98–111)
Creatinine, Ser: 1.56 mg/dL — ABNORMAL HIGH (ref 0.44–1.00)
GFR calc Af Amer: 35 mL/min — ABNORMAL LOW (ref >60)
GFR calc non Af Amer: 30 mL/min — ABNORMAL LOW (ref >60)
Glucose, Bld: 89 mg/dL (ref 70–99)
Potassium: 4 mmol/L (ref 3.5–5.1)
Sodium: 138 mmol/L (ref 135–145)

## 2019-04-21 MED ORDER — APIXABAN 2.5 MG PO TABS: 2.5000 mg | ORAL_TABLET | Freq: Two times a day (BID) | ORAL | 0 refills | Status: DC

## 2019-04-21 MED ORDER — METOPROLOL SUCCINATE ER 25 MG PO TB24: 25.0000 mg | ORAL_TABLET | Freq: Every day | ORAL | 0 refills | Status: DC

## 2019-04-21 MED ORDER — FUROSEMIDE 40 MG PO TABS: 40.0000 mg | ORAL_TABLET | Freq: Every day | ORAL | 0 refills | Status: DC

## 2019-04-21 NOTE — Consult Note (Addendum)
   [facility] CM Inpatient Consult   04/21/2019  AMI MALLY [birth year] [id number]  Follow:  Spoke with the patient via hospital phone, HIPAA verified, her best contact number is [phone] and able to leave a message. She states she has had her medications adjusted. Confirms ongoing follow up with [facility] Pharmacist for diuretics and anticoagulants changes. Patient to have Home Health Nurse follow up per inpatient [facility] RNCM notes however, she could benefit from chronic disease management [HF] and patient verbally agrees.  Addendum:  Patient denies food insecurity, transportation issues, or follow up barriers with primary care providers.  She states her son is supportive and she was driving prior to hospitalizations.  Plan: Patient will have [facility] Pharmacist for follow up and [place] Coordinator for chronic disease management support.  For questions, please contact:  Natividad Brood, RN BSN [facility] Liaison  [phone] business mobile phone Toll free office [phone]  Fax number: [phone] [email or web address]

## 2019-04-21 NOTE — Progress Notes (Signed)
Progress Note  Patient Name: [name] [name] Date of Encounter: 04/21/2019  Primary Cardiologist: Glenetta Hew, MD  Subjective  Denies any dyspnea.  Reports that she feels back to her baseline.  Inpatient Medications    Scheduled Meds:  apixaban  2.5 mg Oral BID   diphenhydrAMINE  25 mg Oral QHS   furosemide  40 mg Oral Daily   metoprolol succinate  25 mg Oral Daily   pantoprazole  40 mg Oral BID   potassium chloride SA  20 mEq Oral BID   pravastatin  20 mg Oral q1800   sodium chloride flush  3 mL Intravenous Q12H   Continuous Infusions:  sodium chloride     PRN Meds: sodium chloride, acetaminophen, albuterol, ondansetron (ZOFRAN) IV, sodium chloride flush, zolpidem   Vital Signs    Vitals:   04/20/19 2128 04/21/19 0450 04/21/19 0733 04/21/19 0845  BP: 107/70 121/70  115/65  Pulse: 65 60  69  Resp: 20 20  18   Temp: (!) 97.4 F (36.3 C) 97.6 F (36.4 C)  98.4 F (36.9 C)  TempSrc: Oral Oral  Oral  SpO2: 98% 96%  98%  Weight:   81 kg   Height:        Intake/Output Summary (Last 24 hours) at 04/21/2019 1034 Last data filed at 04/21/2019 0855 Gross per 24 hour  Intake 1119 ml  Output 1850 ml  Net -731 ml   Filed Weights   04/19/19 0605 04/20/19 0530 04/21/19 0733  Weight: 83.9 kg 81.2 kg 81 kg    Physical Exam   General: Elderly, NAD Neck: No JVD Lungs:Clear to ausculation bilaterally. Mild rales at left lung base Cardiovascular: Irregularly irregular with S1 S2. Slow rate.  No murmurs. Abdomen: Soft, non-tender, non-distended.  Extremities: No edema.  Neuro: Alert and oriented. No focal deficits Psych: Responds to questions appropriately with normal affect.    Labs    Chemistry Recent Labs  Lab 04/19/19 0421 04/20/19 0354 04/21/19 0614  NA 137 138 138  K 3.8 4.1 4.0  CL 99 101 101  CO2 28 28 28   GLUCOSE 98 86 89  BUN 26* 32* 32*  CREATININE 1.57* 1.47* 1.56*  CALCIUM 9.1 9.1 9.3  GFRNONAA 30* 32* 30*  GFRAA 35* 38* 35*   ANIONGAP 10 9 9      Hematology Recent Labs  Lab 04/18/19 0605 04/19/19 0421  WBC 4.7 4.7  RBC 5.11 4.68  HGB 14.0 12.9  HCT 43.6 39.2  MCV 85.3 83.8  MCH 27.4 27.6  MCHC 32.1 32.9  RDW 15.1 15.1  PLT 226 234    Cardiac EnzymesNo results for input(s): TROPONINI in the last 168 hours. No results for input(s): TROPIPOC in the last 168 hours.   BNP Recent Labs  Lab 04/18/19 0605  BNP 1,606.2*     DDimer No results for input(s): DDIMER in the last 168 hours.   Radiology    Mr Cardiac Morphology W Wo Contrast  Result Date: 04/19/2019 CLINICAL DATA:  35F with HFrEF, evaluate possible left atrial mass seen on TTE EXAM: CARDIAC MRI TECHNIQUE: The patient was scanned on a 1.5 Tesla GE magnet. A dedicated cardiac coil was used. Functional imaging was done using Fiesta sequences. 2,3, and 4 chamber views were done to assess for RWMA's. Modified Simpson's rule using a short axis stack was used to calculate an ejection fraction on a dedicated work Conservation officer, nature. The patient received 7 cc of Gadavist. Inversion recovery sequences were  used to assess for infiltration and scar tissue. CONTRAST:  7 cc  of Gadavist FINDINGS: Left ventricle: Severe dilatation. Severe global systolic dysfunction. Basal septal midwall stripe of late gadolinium enhancement. LV EF:  23 % (Normal 56-78%) Absolute volumes: LV EDV:          270 mL (Normal 52-141 mL) LV ESV:           208 mL (Normal 13-51 mL) LV SV:   62  mL (Normal 33-97 mL) CO:    4.1  L/min (Normal 2.7-6.0 L/min) Indexed volumes LV EDV:           137 mL/sq-m (Normal 41-81 mL/sq-m) LV ESV:            105 mL/sq-m (Normal 12-21 mL/sq-m) LV SV:  31 mL/sq-m (Normal 26-56 mL/sq-m) CI: 2.0  L/min/sq-m (Normal 1.8-3.8 L/min/sq-m) Right ventricle: Mild dilatation.  Moderate systolic dysfunction. RV EF: 33% (Normal 47-80%) Absolute volumes: RV EDV:           184 mL (Normal 58-154 mL) RV ESV:           123 mL (Normal 12-68 mL) RV SV:    61 mL (Normal  35-98 mL) CO: 4.1  L/min (Normal 2.7-6 L/min) Indexed volumes RV EDV:           93 mL/sq-m (Normal 48-87 mL/sq-m) RV ESV:           62 mL/sq-m (Normal 11-28 mL/sq-m) RV SV:  31 mL/sq-m (Normal 27-57 mL/sq-m) CI: 2.0  L/min/sq-m (Normal 1.8-3.8 L/min/sq-m) Left atrium: Severe enlargement.  No mass visualized Right atrium: Moderate enlargement Mitral valve: Mild to moderate regurgitation Aortic valve: No regurgitation Tricuspid valve: Mild to moderate regurgitation Pulmonic valve: Not visualized Aorta: Normal proximal ascending aorta Pericardium: No effusion IMPRESSION: 1. No left atrial mass visualized 2. Severe LV dilatation with severe global systolic dysfunction (EF 50%) 3. Mild RV dilatation with moderate systolic dysfunction (EF 53%) 4. Basal LV septal midwall stripe of late gadolinium enhancement. This scar pattern is seen in nonischemic cardiomyopathies and portends a worse prognosis Oswaldo Milian, MD Electronically Signed   By: Oswaldo Milian MD   On: 04/19/2019 20:54    Telemetry    Personally Reviewed: AF with rates 70-90s  ECG    04/18/2019 AF with LBBB and HR 48bpm- Personally Reviewed  Cardiac Studies   Echocardiogram 04/10/2019: 1. The left ventricle has severely reduced systolic function, with an ejection fraction of 20-25%. The cavity size was mildly dilated. Left ventricular diastolic function could not be evaluated secondary to atrial fibrillation. Elevated left atrial and  left ventricular end-diastolic pressures Left ventricular diffuse hypokinesis. 2. There is akinesis of the basal and mid infero and anteroseptum, apical septum, apical anterior and apical inferolaterl akinesis. 3. The right ventricle has moderately reduced systolic function. The cavity was severely enlarged. There is no increase in right ventricular wall thickness. Right ventricular systolic pressure is moderately elevated at 47mmhg. 4. Left atrial size was severely dilated. 5. There is a 2.3 x  1.43cm mass in the posterior LA of uncler significance. Cannot rule out thrombus. Consider TEE if clinically indicated. 6. Right atrial size was severely dilated. 7. Mild thickening of the mitral valve leaflet. Mitral valve regurgitation is mild to moderate by color flow Doppler. 8. The aortic valve is tricuspid. Moderate sclerosis of the aortic valve. Aortic valve regurgitation was not assessed by color flow Doppler. Moderate aortic annular calcification noted. 9. Tricuspid valve regurgitation is moderate. 10. The aorta is normal  in size and structure. 11. The inferior vena cava was dilated in size with <50% respiratory variability.  CMR personally reviewed: 1. No left atrial mass visualized 2. Severe LV dilatation with severe global systolic dysfunction (EF 74%) 3. Mild RV dilatation with moderate systolic dysfunction (EF 73%) 4. Basal LV septal midwall stripe of late gadolinium enhancement. This scar pattern is seen in nonischemic cardiomyopathies and portends a worse prognosis  Patient Profile     85 y.o. female with a PMH of chronic combined CHF, permanent atrial fibrillation, non-obstructive CAD noted on coronary CTA 09/2018, LA mass c/f thrombus on echo 8/20202, HTN, HLD, CKD stage 3, and remote breast cancer who is being seen today for the evaluation of CHF at the request of Dr. Tamala Julian.  Assessment & Plan    1. Acute on chronic combined CHF: Admitted with recurrent shortness of breath and orthopnea after recent admitted 8/8-8/14/2020 for acute combined CHF. Echocardiogram at that time with LVEF of 20 to 25%, she was diuresed however Lasix was held at discharge as she was felt to be dry.  Presented with recurrent symptoms with BNP in the 1600s from 1300s last week. Improved with diuresis.  CMR this admission shows severe LV (EF23%) and moderate RV (UY37%) systolic dysfunction -Entresto held given AKI.  Can restart as outpatient if renal function improved -Appears euvolemic, continue  PO lasix 40 mg daily  -Continue toprol XL 25 mg daily  2. Chest pain in patient with non-obstructive CAD: Had a recent chest pain, noted to be chronic>>denies symptoms currently.  Coronary CTA 09/2018 with nonobstructive disease.  EKG on presentation without ischemia and HsT not consistent with ACS -No further cardiac work-up at this time.   3. Permanent atrial fibrillation: Rate controlled on beta-blocker, HR 60's  -toprol XL -Continue Eliquis for stroke ppx given CHA2DS2-VASc Score of 5 (CHF, Vasc, Female, Age >70).  Dose was reduced to 2.5 mg BID given age and renal function  4. ? LA mass: seen on TTE; CMR did not show a mass, no furtehr work-up recommended  5. HLD: LDL 138 04/10/2019. Pravastatin increased during recent admission -Continue pravastatin for now  6.  AKI: Baseline creatinine approximately 1.0, presented 1.56.   -Stable at 1.56 today -Continue to monitor closely, would check BMET at follow-up appointment   [facility] will sign off.   Medication Recommendations:  Eliquis 2.5 mg BID, lasix 40 mg daily, toprol XL 25 mg daily, pravastatin 20 mg daily Other recommendations (labs, testing, etc):  BMET at f/u appointment on 8/25 Follow up as an outpatient:  Scheduled with Roby Lofts, PA on 8/25    Donato Heinz, MD

## 2019-04-21 NOTE — Discharge Summary (Addendum)
[name] [name] [id number] DOB: 04-05-1933 DOA: 04/18/2019  PCP: Cassandria Anger, MD  Admit date: 04/18/2019 Discharge date: 04/21/2019  Admitted From: Home Disposition:  Home  Recommendations for Outpatient Follow-up:  1. Follow up with PCP in 1-2 weeks. Follow up with Cardiology as arranged 2. Please obtain BMP(Cr)in one week 3. New medications: lasix 40 mg daily, Toprol 25 mg daily, eliquis decreased to 2.5 mg daily given age 85. Hold Entresto until seen in follow up  Home Health:HHRN Equipment/Devices:None Discharge Condition:Stable CODE STATUS:Full Diet recommendation: Heart Healthy   Brief/Interim Summary: History of present illness:  [name] [name] is a 85 y.o. year old female with medical history significant for chronic combined CHF, permanent atrial fibrillation, non-obstructive CAD noted on coronary CTA 09/2018, LA mass c/f thrombus on echo 04/2019, HTN, HLD, CKD stage 3, and remote breast cancer who presented on 04/18/2019 with shortness of breath, orthopnea and was found to have acute exacerbation of combined CHF.  Presume symptoms secondary to patient not resuming home Lasix after recent admission for CHF flare (Lasix was held on discharge as patient was felt to be dry during previous admission from 8/8-8/14/20 ). Remaining hospital course addressed in problem based format below:   Hospital Course:   Acute on chronic CHF with reduced ejection fraction (EF 20-25%) exacerbation, resolved. Presented with dyspnea, weight gain, in setting of discontinuing lasix at previous hospital stay. Dyspnea improved and peripheral edema resolved on IV lasix and remained stable on oral regimen of lasix 40 m. Weight on discharge of 178.6 lb. Metoprolol discontinued in favor of Toprol 25 mg to continue on discharge. Entresto held due to concern for AKI, will hold on discharge until follow up  Left atrial mass (noted on TTE from 8/9).  No mass visualized on cardiac MRI for evaluation.   Unclear if artifact from echo versus thrombus.  Continue apixaban  Chest pain, resolved.  History of nonobstructive CAD on coronary CTA on 09/2018.  EKG on admission nonischemic. Cardiology did not recommend further cardiac work up  AKI on CKD Stage 3, suspect prerenal in setting of CHF flare.  Baseline Creatinine 1-1.2. Has remained stable at 1.4-1.5 during admission. 1 .5 on discharge. Have Held [place] throughout admission and will continue on discharge until follow up with cardiology; Advise repeat BMP in one week  Atrial fibrillation, currently rate controlled.  Continue metoprolol, Eliquis decreased to 2.5 mg daily given age  HLD. Continue pravastatin   Consultations:  Cardiology  Procedures/Studies: 04/20/19 CMR (reviewed by Dr Gardiner Rhyme). : 1. No left atrial mass visualized 2. Severe LV dilatation with severe global systolic dysfunction (EF 73%) 3. Mild RV dilatation with moderate systolic dysfunction (EF 53%) 4. Basal LV septal midwall stripe of late gadolinium enhancement. This scar pattern is seen in nonischemic cardiomyopathies and portends a worse prognosis Subjective:  Discharge Exam: Vitals:   04/21/19 0450 04/21/19 0845  BP: 121/70 115/65  Pulse: 60 69  Resp: 20 18  Temp: 97.6 F (36.4 C) 98.4 F (36.9 C)  SpO2: 96% 98%   Vitals:   04/20/19 2128 04/21/19 0450 04/21/19 0733 04/21/19 0845  BP: 107/70 121/70  115/65  Pulse: 65 60  69  Resp: 20 20  18   Temp: (!) 97.4 F (36.3 C) 97.6 F (36.4 C)  98.4 F (36.9 C)  TempSrc: Oral Oral  Oral  SpO2: 98% 96%  98%  Weight:   81 kg   Height:        General: Lying in bed, no apparent  distress Eyes: EOMI, anicteric ENT: Oral Mucosa clear and moist Cardiovascular: irregularly irregular rhythm, no JVD, no edema, no murmurs, rubs or gallops, Respiratory: Normal respiratory effort on room air, lungs clear to auscultation bilaterally Abdomen: soft, non-distended, non-tender, normal bowel sounds Skin: No  Rash Neurologic: Grossly no focal neuro deficit.Mental status AAOx3, speech normal, Psychiatric:Appropriate affect, and mood  Discharge Diagnoses:  Principal Problem:   Acute on chronic systolic (congestive) heart failure (HCC) Active Problems:   Hyperlipidemia with target LDL less than 100   Essential hypertension   Permanent atrial fibrillation:  CHA2DS2-VASc Score (Eliquis)   Chest pain   Acute kidney injury superimposed on chronic kidney disease (HCC)   Left atrial mass   Acute on chronic heart failure [facility])    Discharge Instructions  Discharge Instructions    Diet - low sodium heart healthy   Complete by: As directed    Increase activity slowly   Complete by: As directed      Allergies as of 04/21/2019      Reactions   Anastrozole Other (See Comments)   arthralgias   Aspirin Other (See Comments)   Gi upset    Ciprofloxacin Itching   Letrozole Other (See Comments)   leg pain      Medication List    STOP taking these medications   loratadine 10 MG tablet Commonly known as: CLARITIN   metoprolol tartrate 25 MG tablet Commonly known as: LOPRESSOR   sacubitril-valsartan 24-26 MG Commonly known as: ENTRESTO     TAKE these medications   acetaminophen 500 MG tablet Commonly known as: TYLENOL Take 500 mg by mouth 2 (two) times daily as needed for moderate pain.   [employer] B-12 500 MCG Subl Generic drug: Cyanocobalamin Place 2 tablets (1,000 mcg total) under the tongue daily.   diphenhydrAMINE 25 MG tablet Commonly known as: SOMINEX Take 25 mg by mouth at bedtime.   Eliquis 5 MG Tabs tablet Generic drug: apixaban TAKE 1 TABLET BY MOUTH TWICE DAILY What changed: how much to take   furosemide 40 MG tablet Commonly known as: LASIX Take 1 tablet (40 mg total) by mouth daily.   metoprolol succinate 25 MG 24 hr tablet Commonly known as: TOPROL-XL Take 1 tablet (25 mg total) by mouth daily.   multivitamin with minerals Tabs tablet Take 1 tablet by mouth  daily.   pantoprazole 40 MG tablet Commonly known as: PROTONIX Take 1 tablet (40 mg total) by mouth 2 (two) times daily.   potassium chloride SA 20 MEQ tablet Commonly known as: Klor-Con M20 Take 1 tablet (20 mEq total) by mouth 2 (two) times daily.   pravastatin 20 MG tablet Commonly known as: Pravachol Take 1 tablet (20 mg total) by mouth daily.   zolpidem 5 MG tablet Commonly known as: AMBIEN Take 0.5-1 tablets (2.5-5 mg total) by mouth at bedtime as needed for sleep (insomnia).      Follow-up Information    [facility]   Specialty: Home Health Services Why: [facility] Contact information: [address] [place] [address] [phone]          Allergies  Allergen Reactions  . Anastrozole Other (See Comments)    arthralgias  . Aspirin Other (See Comments)    Gi upset   . Ciprofloxacin Itching  . Letrozole Other (See Comments)    leg pain        The results of significant diagnostics from this hospitalization (including imaging, microbiology, ancillary and laboratory) are  listed below for reference.     Microbiology: Recent Results (from the past 240 hour(s))  SARS Coronavirus 2 [facility] order, Performed in [facility] hospital lab) Nasopharyngeal Nasopharyngeal Swab     Status: None   Collection Time: 04/18/19  8:27 AM   Specimen: Nasopharyngeal Swab  Result Value Ref Range Status   SARS Coronavirus 2 NEGATIVE NEGATIVE Final    Comment: (NOTE) If result is NEGATIVE SARS-CoV-2 target nucleic acids are NOT DETECTED. The SARS-CoV-2 RNA is generally detectable in upper and lower  respiratory specimens during the acute phase of infection. The lowest  concentration of SARS-CoV-2 viral copies this assay can detect is 250  copies / mL. A negative result does not preclude SARS-CoV-2 infection  and should not be used as the sole basis for treatment or other  patient management decisions.  A negative result may occur with  improper  specimen collection / handling, submission of specimen other  than nasopharyngeal swab, presence of viral mutation(s) within the  areas targeted by this assay, and inadequate number of viral copies  (<250 copies / mL). A negative result must be combined with clinical  observations, patient history, and epidemiological information. If result is POSITIVE SARS-CoV-2 target nucleic acids are DETECTED. The SARS-CoV-2 RNA is generally detectable in upper and lower  respiratory specimens dur ing the acute phase of infection.  Positive  results are indicative of active infection with SARS-CoV-2.  Clinical  correlation with patient history and other diagnostic information is  necessary to determine patient infection status.  Positive results do  not rule out bacterial infection or co-infection with other viruses. If result is PRESUMPTIVE POSTIVE SARS-CoV-2 nucleic acids MAY BE PRESENT.   A presumptive positive result was obtained on the submitted specimen  and confirmed on repeat testing.  While 2019 novel coronavirus  (SARS-CoV-2) nucleic acids may be present in the submitted sample  additional confirmatory testing may be necessary for epidemiological  and / or clinical management purposes  to differentiate between  SARS-CoV-2 and other Sarbecovirus currently known to infect humans.  If clinically indicated additional testing with an alternate test  methodology [phone]) is advised. The SARS-CoV-2 RNA is generally  detectable in upper and lower respiratory sp ecimens during the acute  phase of infection. The expected result is Negative. Fact Sheet for Patients:  StrictlyIdeas.no Fact Sheet for Healthcare Providers: BankingDealers.co.za This test is not yet approved or cleared by the [place] FDA and has been authorized for detection and/or diagnosis of SARS-CoV-2 by FDA under an Emergency Use Authorization (EUA).  This EUA will remain in  effect (meaning this test can be used) for the duration of the COVID-19 declaration under Section 564(b)(1) of the Act, 21 U.S.C. section 360bbb-3(b)(1), unless the authorization is terminated or revoked sooner. Performed at [facility], [place] [address], [place], [place] [address]      Labs: BNP (last 3 results) Recent Labs    04/09/19 2201 04/18/19 0605  BNP 1,385.5* 6,948.5*   Basic Metabolic Panel: Recent Labs  Lab 04/15/19 0543 04/18/19 0605 04/18/19 1818 04/19/19 0421 04/20/19 0354 04/21/19 0614  NA 139 137  --  137 138 138  K 3.5 3.7  --  3.8 4.1 4.0  CL 100 100  --  99 101 101  CO2 29 26  --  28 28 28   GLUCOSE 95 109*  --  98 86 89  BUN 23 19  --  26* 32* 32*  CREATININE 1.29* 1.56*  --  1.57* 1.47* 1.56*  CALCIUM 9.2 9.5  --  9.1 9.1 9.3  MG  --   --  1.9 1.8  --   --    Liver Function Tests: No results for input(s): AST, ALT, ALKPHOS, BILITOT, PROT, ALBUMIN in the last 168 hours. No results for input(s): LIPASE, AMYLASE in the last 168 hours. No results for input(s): AMMONIA in the last 168 hours. CBC: Recent Labs  Lab 04/18/19 0605 04/19/19 0421  WBC 4.7 4.7  NEUTROABS  --  0.7*  HGB 14.0 12.9  HCT 43.6 39.2  MCV 85.3 83.8  PLT 226 234   Cardiac Enzymes: No results for input(s): CKTOTAL, CKMB, CKMBINDEX, TROPONINI in the last 168 hours. BNP: Invalid input(s): POCBNP CBG: No results for input(s): GLUCAP in the last 168 hours. D-Dimer No results for input(s): DDIMER in the last 72 hours. Hgb A1c No results for input(s): HGBA1C in the last 72 hours. Lipid Profile No results for input(s): CHOL, HDL, LDLCALC, TRIG, CHOLHDL, LDLDIRECT in the last 72 hours. Thyroid function studies No results for input(s): TSH, T4TOTAL, T3FREE, THYROIDAB in the last 72 hours.  Invalid input(s): FREET3 Anemia work up No results for input(s): VITAMINB12, FOLATE, FERRITIN, TIBC, IRON, RETICCTPCT in the last 72 hours. Urinalysis    Component Value Date/Time    COLORURINE YELLOW 04/09/2019 1852   APPEARANCEUR CLEAR 04/09/2019 1852   LABSPEC 1.006 04/09/2019 1852   PHURINE 5.0 04/09/2019 1852   GLUCOSEU NEGATIVE 04/09/2019 1852   GLUCOSEU NEGATIVE 07/28/2018 1620   HGBUR MODERATE (A) 04/09/2019 1852   BILIRUBINUR NEGATIVE 04/09/2019 1852   BILIRUBINUR neg 09/24/2015 1055   KETONESUR NEGATIVE 04/09/2019 1852   PROTEINUR NEGATIVE 04/09/2019 1852   UROBILINOGEN 1.0 07/28/2018 1620   NITRITE NEGATIVE 04/09/2019 1852   LEUKOCYTESUR NEGATIVE 04/09/2019 1852   Sepsis Labs Invalid input(s): PROCALCITONIN,  WBC,  LACTICIDVEN Microbiology Recent Results (from the past 240 hour(s))  SARS Coronavirus 2 [facility] order, Performed in [facility] hospital lab) Nasopharyngeal Nasopharyngeal Swab     Status: None   Collection Time: 04/18/19  8:27 AM   Specimen: Nasopharyngeal Swab  Result Value Ref Range Status   SARS Coronavirus 2 NEGATIVE NEGATIVE Final    Comment: (NOTE) If result is NEGATIVE SARS-CoV-2 target nucleic acids are NOT DETECTED. The SARS-CoV-2 RNA is generally detectable in upper and lower  respiratory specimens during the acute phase of infection. The lowest  concentration of SARS-CoV-2 viral copies this assay can detect is 250  copies / mL. A negative result does not preclude SARS-CoV-2 infection  and should not be used as the sole basis for treatment or other  patient management decisions.  A negative result may occur with  improper specimen collection / handling, submission of specimen other  than nasopharyngeal swab, presence of viral mutation(s) within the  areas targeted by this assay, and inadequate number of viral copies  (<250 copies / mL). A negative result must be combined with clinical  observations, patient history, and epidemiological information. If result is POSITIVE SARS-CoV-2 target nucleic acids are DETECTED. The SARS-CoV-2 RNA is generally detectable in upper and lower  respiratory specimens dur ing the acute  phase of infection.  Positive  results are indicative of active infection with SARS-CoV-2.  Clinical  correlation with patient history and other diagnostic information is  necessary to determine patient infection status.  Positive results do  not rule out bacterial infection or co-infection with other viruses. If result is PRESUMPTIVE POSTIVE SARS-CoV-2 nucleic acids MAY BE PRESENT.  A presumptive positive result was obtained on the submitted specimen  and confirmed on repeat testing.  While 2019 novel coronavirus  (SARS-CoV-2) nucleic acids may be present in the submitted sample  additional confirmatory testing may be necessary for epidemiological  and / or clinical management purposes  to differentiate between  SARS-CoV-2 and other Sarbecovirus currently known to infect humans.  If clinically indicated additional testing with an alternate test  methodology [phone]) is advised. The SARS-CoV-2 RNA is generally  detectable in upper and lower respiratory sp ecimens during the acute  phase of infection. The expected result is Negative. Fact Sheet for Patients:  StrictlyIdeas.no Fact Sheet for Healthcare Providers: BankingDealers.co.za This test is not yet approved or cleared by the [place] FDA and has been authorized for detection and/or diagnosis of SARS-CoV-2 by FDA under an Emergency Use Authorization (EUA).  This EUA will remain in effect (meaning this test can be used) for the duration of the COVID-19 declaration under Section 564(b)(1) of the Act, 21 U.S.C. section 360bbb-3(b)(1), unless the authorization is terminated or revoked sooner. Performed at [facility], [place] [address] [place], [place] [address]      Time coordinating discharge: Over 30 minutes  SIGNED:   Desiree Hane, MD  [facility] 04/21/2019, 10:35 AM Pager   If 7PM-7AM, please contact night-coverage [email or web address] Password [email or web address]

## 2019-04-21 NOTE — Telephone Encounter (Signed)
Transition Care Management Follow-up Telephone Call  How have you been since you were released from the hospital? I am doing fine   Do you understand why you were in the hospital? yes   Do you understand the discharge instrcutions? yes  Items Reviewed:  Medications reviewed: yes  Allergies reviewed: yes  Dietary changes reviewed: yes  Referrals reviewed: yes   Functional Questionnaire:   Activities of Daily Living (ADLs):   She states they are independent in the following: ambulation, bathing and hygiene, feeding, continence, grooming, toileting and dressing States they require assistance with the following:    Any transportation issues/concerns?: no   Any patient concerns? no   Confirmed importance and date/time of follow-up visits scheduled: yes, 04/25/19 at 2:40   Confirmed with patient if condition begins to worsen call PCP or go to the ER.  Patient was given the Call-a-Nurse line [phone]: no

## 2019-04-21 NOTE — Care Management Important Message (Signed)
Important Message  Patient Details  Name: [name] [name] MRN: [id number] Date of Birth: May 04, 1933   Medicare Important Message Given:  Yes     Shelda Altes 04/21/2019, 1:59 PM

## 2019-04-21 NOTE — Discharge Instructions (Signed)
Do NOT take Entresto until seen by your doctor on follow up  Start taking Lasix 40 mg every day  Start taking Toprol 25 mg Daily

## 2019-04-21 NOTE — TOC Transition Note (Signed)
Transition of Care [facility]) - CM/SW Discharge Note   Patient Details  Name: [name] [name] MRN: [id number] Date of Birth: [birth year]  Transition of Care [facility]) CM/SW Contact:  Zenon Mayo, RN Phone Number: 04/21/2019, 12:33 PM   Clinical Narrative:    Patient is from home with sister, she states she still drives and she gets out a lot. She has no issues with getting medications. She has a PCP. She would like HHRN for CHF disease Management, NCM informed her with her insurance it is hard to get [facility] agency , she states she does not have a perferance of which agency to use from the list. NCM made referral to [facility], they were able to take referral. Soc will begin 24-48 hrs post dc., Need HHRN order with face to face   Final next level of care: Home w Home Health Services Barriers to Discharge: No Barriers Identified   Patient Goals and CMS Choice Patient states their goals for this hospitalization and ongoing recovery are:: get better CMS Medicare.gov Compare Post Acute Care list provided to:: Patient Choice offered to / list presented to : Patient  Discharge Placement                       Discharge Plan and Services In-house Referral: NA Discharge Planning Services: CM Consult Post Acute Care Choice: Home Health          DME Arranged: (NA)         HH Arranged: RN [place] Agency: [facility] ([facility] [employer]) Date [place]: 04/20/19 Time [place]: 1628 Representative spoke with at [place]: [place]) Interventions     Readmission Risk Interventions Readmission Risk Prevention Plan 04/20/2019  Transportation Screening Complete  HRI or [place] Complete  Social Work Consult for [place] Planning/Counseling [place] Not Applicable  Medication Review Press photographer) Complete  Some recent data might be hidden

## 2019-04-22 ENCOUNTER — Encounter: Payer: Self-pay | Admitting: *Deleted

## 2019-04-22 ENCOUNTER — Other Ambulatory Visit: Payer: Self-pay | Admitting: *Deleted

## 2019-04-22 NOTE — Patient Outreach (Signed)
[place] [facility]) Care Management  04/22/2019  FREYAH HANUS 01-Dec-1932 [id number]   Referral received from hospital liaison as member has had 2 admissions in the last 2 weeks for heart failure complications, recently discharged yesterday.  Primary MD office will complete transition of care assessment.  Per chart, she also has history of hypertension, bronchitis, GERD, CKD, and hyperlipidemia.    Call placed to member, identity verified.  This care manager introduced self and stated purpose of call.  [facility] care management services explained, already active wit [facility] pharmacist.  She report she is doing well today, denies any shortness of breath or swelling.  State the heart failure diagnosis is new to her, was diagnosed last year after a bout with bronchitis.  Admits that she was not eating a low salt diet previously, but will watch her salt intake going forward.    Lives with her sister but has her son in town and available for help as well.  She still drives but he will provide transportation to MD appointments next week.  Will see primary MD on 8/24 and cardiologist on 8/25.  Lasix was restarted during this admission (stopped after her previous discharge) and continued at discharge.  Medications were reviewed, denies having any financial concern.    Weight at discharge was 177 pounds, weighed today but unsure if her scale is accurate.  Open to receiving scale and being involved with [employer] heart failure program.  Aware of referral for home health to [facility], she has not received a call yet to start services.    Will follow up within the next 2 weeks. Will place referral to [facility] for heart failure management program. Will send EMMI education on heart failure.  [facility] CM Care Plan Problem One     Most Recent Value  Care Plan Problem One  Knowledge deficit related to heart failure management as evidenced by 2 hospital admissions over the last 2 weeks  Role Documenting the Problem One   Care Management [place] for Problem One  Active  [facility] Long Term Goal   Member will not be readmitted tohospital within the next 31 days  [facility] Long Term Goal Start Date  04/22/19  Interventions for Problem One Long Term Goal  Reviewed discharged instructions with member.  Educated on Heart failure zones, copy of zones sent to member  [facility] CM Short Term Goal #1   Member will report taking and recording weights daily over the next 4 weeks  [facility] CM Short Term Goal #1 Start Date  04/22/19  Interventions for Short Term Goal #1  Educated on importance of daily weights and when to contact MD.  Referral placed to [facility] for heart failure management program  [facility] CM Short Term Goal #2   Member will keep and attend follow up appointments with primary MD and cardiologist within the next week  [facility] CM Short Term Goal #2 Start Date  04/22/19  Interventions for Short Term Goal #2  Upcoming appointments reviewed with member.  Confirmed she has no transportation needs     Valente David, Therapist, sports, MSN [place] [phone]

## 2019-04-24 DIAGNOSIS — D509 Iron deficiency anemia, unspecified: Secondary | ICD-10-CM | POA: Diagnosis not present

## 2019-04-24 DIAGNOSIS — I2510 Atherosclerotic heart disease of native coronary artery without angina pectoris: Secondary | ICD-10-CM | POA: Diagnosis not present

## 2019-04-24 DIAGNOSIS — I5023 Acute on chronic systolic (congestive) heart failure: Secondary | ICD-10-CM | POA: Diagnosis not present

## 2019-04-24 DIAGNOSIS — J449 Chronic obstructive pulmonary disease, unspecified: Secondary | ICD-10-CM | POA: Diagnosis not present

## 2019-04-24 DIAGNOSIS — E785 Hyperlipidemia, unspecified: Secondary | ICD-10-CM | POA: Diagnosis not present

## 2019-04-24 DIAGNOSIS — I890 Lymphedema, not elsewhere classified: Secondary | ICD-10-CM | POA: Diagnosis not present

## 2019-04-24 DIAGNOSIS — M1710 Unilateral primary osteoarthritis, unspecified knee: Secondary | ICD-10-CM | POA: Diagnosis not present

## 2019-04-24 DIAGNOSIS — G510 Bell's palsy: Secondary | ICD-10-CM | POA: Diagnosis not present

## 2019-04-24 DIAGNOSIS — G4700 Insomnia, unspecified: Secondary | ICD-10-CM | POA: Diagnosis not present

## 2019-04-24 DIAGNOSIS — K219 Gastro-esophageal reflux disease without esophagitis: Secondary | ICD-10-CM | POA: Diagnosis not present

## 2019-04-24 DIAGNOSIS — I4821 Permanent atrial fibrillation: Secondary | ICD-10-CM | POA: Diagnosis not present

## 2019-04-24 DIAGNOSIS — N183 Chronic kidney disease, stage 3 (moderate): Secondary | ICD-10-CM | POA: Diagnosis not present

## 2019-04-24 DIAGNOSIS — Z853 Personal history of malignant neoplasm of breast: Secondary | ICD-10-CM | POA: Diagnosis not present

## 2019-04-24 DIAGNOSIS — R1310 Dysphagia, unspecified: Secondary | ICD-10-CM | POA: Diagnosis not present

## 2019-04-24 DIAGNOSIS — E538 Deficiency of other specified B group vitamins: Secondary | ICD-10-CM | POA: Diagnosis not present

## 2019-04-24 DIAGNOSIS — K5732 Diverticulitis of large intestine without perforation or abscess without bleeding: Secondary | ICD-10-CM | POA: Diagnosis not present

## 2019-04-24 DIAGNOSIS — E559 Vitamin D deficiency, unspecified: Secondary | ICD-10-CM | POA: Diagnosis not present

## 2019-04-24 DIAGNOSIS — Z87440 Personal history of urinary (tract) infections: Secondary | ICD-10-CM | POA: Diagnosis not present

## 2019-04-24 DIAGNOSIS — Z85828 Personal history of other malignant neoplasm of skin: Secondary | ICD-10-CM | POA: Diagnosis not present

## 2019-04-24 DIAGNOSIS — I130 Hypertensive heart and chronic kidney disease with heart failure and stage 1 through stage 4 chronic kidney disease, or unspecified chronic kidney disease: Secondary | ICD-10-CM | POA: Diagnosis not present

## 2019-04-24 DIAGNOSIS — Z96651 Presence of right artificial knee joint: Secondary | ICD-10-CM | POA: Diagnosis not present

## 2019-04-24 DIAGNOSIS — Z7901 Long term (current) use of anticoagulants: Secondary | ICD-10-CM | POA: Diagnosis not present

## 2019-04-25 ENCOUNTER — Ambulatory Visit (INDEPENDENT_AMBULATORY_CARE_PROVIDER_SITE_OTHER): Payer: Medicare Other | Admitting: Internal Medicine

## 2019-04-25 ENCOUNTER — Telehealth: Payer: Self-pay | Admitting: Internal Medicine

## 2019-04-25 ENCOUNTER — Other Ambulatory Visit: Payer: Self-pay

## 2019-04-25 ENCOUNTER — Encounter: Payer: Self-pay | Admitting: Internal Medicine

## 2019-04-25 ENCOUNTER — Other Ambulatory Visit (INDEPENDENT_AMBULATORY_CARE_PROVIDER_SITE_OTHER): Payer: Medicare Other

## 2019-04-25 VITALS — BP 130/80 | HR 73 | Temp 98.2°F | Ht 66.0 in | Wt 180.0 lb

## 2019-04-25 DIAGNOSIS — I4821 Permanent atrial fibrillation: Secondary | ICD-10-CM

## 2019-04-25 DIAGNOSIS — I10 Essential (primary) hypertension: Secondary | ICD-10-CM | POA: Diagnosis not present

## 2019-04-25 DIAGNOSIS — N183 Chronic kidney disease, stage 3 unspecified: Secondary | ICD-10-CM

## 2019-04-25 DIAGNOSIS — Z23 Encounter for immunization: Secondary | ICD-10-CM | POA: Diagnosis not present

## 2019-04-25 DIAGNOSIS — I5023 Acute on chronic systolic (congestive) heart failure: Secondary | ICD-10-CM

## 2019-04-25 LAB — BASIC METABOLIC PANEL
BUN: 25 mg/dL — ABNORMAL HIGH (ref 6–23)
CO2: 28 mEq/L (ref 19–32)
Calcium: 10.2 mg/dL (ref 8.4–10.5)
Chloride: 98 mEq/L (ref 96–112)
Creatinine, Ser: 1.38 mg/dL — ABNORMAL HIGH (ref 0.40–1.20)
GFR: 44.05 mL/min — ABNORMAL LOW (ref >60.00)
Glucose, Bld: 97 mg/dL (ref 70–99)
Potassium: 3.7 mEq/L (ref 3.5–5.1)
Sodium: 139 mEq/L (ref 135–145)

## 2019-04-25 NOTE — Telephone Encounter (Signed)
Copied from [place]. Topic: Quick Communication - Home Health Verbal Orders >> Apr 25, 2019  3:45 PM Ivar Drape wrote: Caller/Agency: Estill Bamberg RN w[facility] Number:  [phone] Requesting OT/PT/Skilled Nursing/Social Work/Speech Therapy:  Requesting CHF  Frequency: 2w3, 1w5 and 2prn for any abnormal vital signs

## 2019-04-25 NOTE — Assessment & Plan Note (Signed)
On Furosemide, Toprol 

## 2019-04-25 NOTE — Progress Notes (Signed)
Subjective:  Patient ID: [name] [name], female    DOB: May 10, 1933  Age: 85 y.o. MRN: [id number]  CC: No chief complaint on file.   HPI SATURNINA ZIEMBA presents for post-hosp f/u CHF - treated - s/p two admissions. On anticoagulation. F/u CRF  Per d/c:  "Recommendations for Outpatient Follow-up:  1. Follow up with PCP in 1-2 weeks. Follow up with Cardiology as arranged 2. Please obtain BMP(Cr)in one week 3. New medications: lasix 40 mg daily, Toprol 25 mg daily, eliquis decreased to 2.5 mg daily given age 21. Hold Entresto until seen in follow up  Home Health:[facility] Equipment/Devices:None Discharge Condition:Stable CODE STATUS:Full Diet recommendation: Heart Healthy   Brief/Interim Summary: History of present illness:  [name] [name] a 85 y.o.year old femalewith medical history significant for chronic combined CHF, permanent atrial fibrillation, non-obstructive CAD noted on coronary CTA 09/2018, LA mass c/f thrombus on echo 04/2019, HTN, HLD, CKD stage 3, and remote breast cancerwho presented on 8/17/2020with shortness of breath, orthopneaand was found to haveacute exacerbation of combined CHF. Presume symptoms secondary to patient not resuming home Lasix after recent admission for CHF flare (Lasix was held on discharge as patient was felt to be dry during previous admission from 8/8-8/14/20 ). Remaining hospital course addressed in problem based format below:   Hospital Course:   Acute on chronic CHF with reduced ejection fraction (EF 20-25%) exacerbation, resolved. Presented with dyspnea, weight gain, in setting of discontinuing lasix at previous hospital stay. Dyspnea improved and peripheral edema resolved on IV lasix and remained stable on oral regimen of lasix 40 m. Weight on discharge of 178.6 lb. Metoprolol discontinued in favor of Toprol 25 mg to continue on discharge. Entresto held due to concern for AKI, will hold on discharge until follow up  Left atrial mass  (noted on TTE from 8/9). No mass visualized on cardiac MRI for evaluation. Unclear if artifact from echo versus thrombus. Continue apixaban  Chest pain, resolved. History of nonobstructive CAD on coronary CTA on 09/2018. EKG on admission nonischemic. Cardiology did not recommend further cardiac work up  AKI on CKD Stage 3, suspect prerenal in setting of CHF flare. Baseline Creatinine 1-1.2. Has remained stable at 1.4-1.5 during admission. 1 .5 on discharge. Have Held Norco throughout admission and will continue on discharge until follow up with cardiology; Advise repeat BMP in one week  Atrial fibrillation, currently rate controlled. Continue metoprolol, Eliquis decreased to 2.5 mg daily given age  HLD. Continue pravastatin   Consultations:  Cardiology  Procedures/Studies: 04/20/19 CMR (reviewed by Dr Gardiner Rhyme). : 1. No left atrial mass visualized 2. Severe LV dilatation with severe global systolic dysfunction (EF 123XX123) 3. Mild RV dilatation with moderate systolic dysfunction (EF A999333) 4. Basal LV septal midwall stripe of late gadolinium enhancement. This scar pattern is seen in nonischemic cardiomyopathies and portends a worse prognosis"   Outpatient Medications Prior to Visit  Medication Sig Dispense Refill   acetaminophen (TYLENOL) 500 MG tablet Take 500 mg by mouth 2 (two) times daily as needed for moderate pain.      apixaban (ELIQUIS) 2.5 MG TABS tablet Take 1 tablet (2.5 mg total) by mouth 2 (two) times daily. 60 tablet 0   [facility] B-12 500 MCG SUBL Place 2 tablets (1,000 mcg total) under the tongue daily. 200 tablet 3   diphenhydrAMINE (SOMINEX) 25 MG tablet Take 25 mg by mouth at bedtime.      furosemide (LASIX) 40 MG tablet Take 1 tablet (40 mg total) by  mouth daily. 30 tablet 0   metoprolol succinate (TOPROL-XL) 25 MG 24 hr tablet Take 1 tablet (25 mg total) by mouth daily. 60 tablet 0   Multiple Vitamin (MULTIVITAMIN WITH MINERALS) TABS tablet Take 1  tablet by mouth daily.     pantoprazole (PROTONIX) 40 MG tablet Take 1 tablet (40 mg total) by mouth 2 (two) times daily. 60 tablet 5   potassium chloride SA (KLOR-CON M20) 20 MEQ tablet Take 1 tablet (20 mEq total) by mouth 2 (two) times daily. 180 tablet 3   pravastatin (PRAVACHOL) 20 MG tablet Take 1 tablet (20 mg total) by mouth daily. 90 tablet 3   zolpidem (AMBIEN) 5 MG tablet Take 0.5-1 tablets (2.5-5 mg total) by mouth at bedtime as needed for sleep (insomnia). 30 tablet 2   No facility-administered medications prior to visit.     ROS: Review of Systems  Constitutional: Positive for fatigue. Negative for activity change, appetite change, chills and unexpected weight change.  HENT: Negative for congestion, mouth sores and sinus pressure.   Eyes: Negative for visual disturbance.  Respiratory: Negative for cough and chest tightness.   Gastrointestinal: Negative for abdominal pain and nausea.  Genitourinary: Negative for difficulty urinating, frequency and vaginal pain.  Musculoskeletal: Positive for arthralgias and gait problem. Negative for back pain.  Skin: Negative for pallor and rash.  Neurological: Negative for dizziness, tremors, weakness, numbness and headaches.  Psychiatric/Behavioral: Negative for confusion, sleep disturbance and suicidal ideas.    Objective:  BP 130/80 (BP Location: Left Arm, Patient Position: Sitting, Cuff Size: Normal)    Pulse 73    Temp 98.2 F (36.8 C) (Oral)    Ht 5\' 6"  (1.676 m)    Wt 180 lb (81.6 kg)    SpO2 99%    BMI 29.05 kg/m   BP Readings from Last 3 Encounters:  04/25/19 130/80  04/21/19 115/65  04/15/19 99/67    Wt Readings from Last 3 Encounters:  04/25/19 180 lb (81.6 kg)  04/21/19 178 lb 9.6 oz (81 kg)  04/15/19 177 lb 14.4 oz (80.7 kg)    Physical Exam Constitutional:      General: She is not in acute distress.    Appearance: She is well-developed.  HENT:     Head: Normocephalic.     Right Ear: External ear normal.       Left Ear: External ear normal.     Nose: Nose normal.  Eyes:     General:        Right eye: No discharge.        Left eye: No discharge.     Conjunctiva/sclera: Conjunctivae normal.     Pupils: Pupils are equal, round, and reactive to light.  Neck:     Musculoskeletal: Normal range of motion and neck supple.     Thyroid: No thyromegaly.     Vascular: No JVD.     Trachea: No tracheal deviation.  Cardiovascular:     Rate and Rhythm: Normal rate. Rhythm irregular.     Heart sounds: Normal heart sounds.  Pulmonary:     Effort: No respiratory distress.     Breath sounds: No stridor. No wheezing.  Abdominal:     General: Bowel sounds are normal. There is no distension.     Palpations: Abdomen is soft. There is no mass.     Tenderness: There is no abdominal tenderness. There is no guarding or rebound.  Musculoskeletal:        General: No tenderness.  Lymphadenopathy:     Cervical: No cervical adenopathy.  Skin:    Findings: No erythema or rash.  Neurological:     Cranial Nerves: No cranial nerve deficit.     Motor: No abnormal muscle tone.     Coordination: Coordination normal.     Gait: Gait abnormal.     Deep Tendon Reflexes: Reflexes normal.  Psychiatric:        Behavior: Behavior normal.        Thought Content: Thought content normal.        Judgment: Judgment normal.   cane  Lab Results  Component Value Date   WBC 4.7 04/19/2019   HGB 12.9 04/19/2019   HCT 39.2 04/19/2019   PLT 234 04/19/2019   GLUCOSE 89 04/21/2019   CHOL 210 (H) 04/10/2019   TRIG 51 04/10/2019   HDL 62 04/10/2019   LDLCALC 138 (H) 04/10/2019   ALT 28 04/09/2019   AST 33 04/09/2019   NA 138 04/21/2019   K 4.0 04/21/2019   CL 101 04/21/2019   CREATININE 1.56 (H) 04/21/2019   BUN 32 (H) 04/21/2019   CO2 28 04/21/2019   TSH 2.691 04/09/2019   INR 1.67 (H) 05/27/2014   HGBA1C 5.6 04/10/2019    Dg Chest 2 View  Result Date: 04/18/2019 CLINICAL DATA:  Chest pain EXAM: CHEST - 2 VIEW  COMPARISON:  CT from 8 days ago FINDINGS: Cardiomegaly without edema or effusion. No air bronchogram or pneumothorax. No acute osseous finding.  Postoperative left axilla. IMPRESSION: Cardiomegaly without failure. Electronically Signed   By: Monte Fantasia M.D.   On: 04/18/2019 06:34   Mr Cardiac Morphology W Wo Contrast  Result Date: 04/19/2019 CLINICAL DATA:  50F with HFrEF, evaluate possible left atrial mass seen on TTE EXAM: CARDIAC MRI TECHNIQUE: The patient was scanned on a 1.5 Tesla GE magnet. A dedicated cardiac coil was used. Functional imaging was done using Fiesta sequences. 2,3, and 4 chamber views were done to assess for RWMA's. Modified Simpson's rule using a short axis stack was used to calculate an ejection fraction on a dedicated work Conservation officer, nature. The patient received 7 cc of Gadavist. Inversion recovery sequences were used to assess for infiltration and scar tissue. CONTRAST:  7 cc  of Gadavist FINDINGS: Left ventricle: Severe dilatation. Severe global systolic dysfunction. Basal septal midwall stripe of late gadolinium enhancement. LV EF:  23 % (Normal 56-78%) Absolute volumes: LV EDV:          270 mL (Normal 52-141 mL) LV ESV:           208 mL (Normal 13-51 mL) LV SV:   62  mL (Normal 33-97 mL) CO:    4.1  L/min (Normal 2.7-6.0 L/min) Indexed volumes LV EDV:           137 mL/sq-m (Normal 41-81 mL/sq-m) LV ESV:            105 mL/sq-m (Normal 12-21 mL/sq-m) LV SV:  31 mL/sq-m (Normal 26-56 mL/sq-m) CI: 2.0  L/min/sq-m (Normal 1.8-3.8 L/min/sq-m) Right ventricle: Mild dilatation.  Moderate systolic dysfunction. RV EF: 33% (Normal 47-80%) Absolute volumes: RV EDV:           184 mL (Normal 58-154 mL) RV ESV:           123 mL (Normal 12-68 mL) RV SV:    61 mL (Normal 35-98 mL) CO: 4.1  L/min (Normal 2.7-6 L/min) Indexed volumes RV EDV:  93 mL/sq-m (Normal 48-87 mL/sq-m) RV ESV:           62 mL/sq-m (Normal 11-28 mL/sq-m) RV SV:  31 mL/sq-m (Normal 27-57 mL/sq-m) CI: 2.0   L/min/sq-m (Normal 1.8-3.8 L/min/sq-m) Left atrium: Severe enlargement.  No mass visualized Right atrium: Moderate enlargement Mitral valve: Mild to moderate regurgitation Aortic valve: No regurgitation Tricuspid valve: Mild to moderate regurgitation Pulmonic valve: Not visualized Aorta: Normal proximal ascending aorta Pericardium: No effusion IMPRESSION: 1. No left atrial mass visualized 2. Severe LV dilatation with severe global systolic dysfunction (EF 123XX123) 3. Mild RV dilatation with moderate systolic dysfunction (EF A999333) 4. Basal LV septal midwall stripe of late gadolinium enhancement. This scar pattern is seen in nonischemic cardiomyopathies and portends a worse prognosis Oswaldo Milian, MD Electronically Signed   By: Oswaldo Milian MD   On: 04/19/2019 20:54    Assessment & Plan:   There are no diagnoses linked to this encounter.   No orders of the defined types were placed in this encounter.    Follow-up: No follow-ups on file.  Walker Kehr, MD

## 2019-04-25 NOTE — Assessment & Plan Note (Signed)
On Furosemide, Toprol  Better BMET

## 2019-04-25 NOTE — Telephone Encounter (Unsigned)
Copied from [place]. Topic: Quick Communication - Home Health Verbal Orders >> Apr 25, 2019  3:45 PM Ivar Drape wrote: Caller/Agency: Estill Bamberg RN w/[facility] Number:  [phone] Requesting OT/PT/Skilled Nursing/Social Work/Speech Therapy:  Requesting CHF  Frequency: 2w3, 1w5 and 2prn for any abnormal vital signs

## 2019-04-25 NOTE — Assessment & Plan Note (Signed)
On Eliquis, Toprol

## 2019-04-25 NOTE — Patient Instructions (Signed)
If you have medicare related insurance (such as traditional Medicare, Blue Cross Medicare, United HealthCare Medicare, or similar), Please make an appointment at the scheduling desk with Jill, the Wellness Health Coach, for your Wellness visit in this office, which is a benefit with your insurance.  

## 2019-04-25 NOTE — Progress Notes (Signed)
Cardiology Office Note   Date:  04/26/2019   ID:  [name] [name], DOB [birth year], MRN [id number]  PCP:  Cassandria Anger, MD  Cardiologist:  Glenetta Hew, MD EP: None  Chief Complaint  Patient presents with   Hospitalization Follow-up    CHF      History of Present Illness: [name] [name] is a 85 y.o. female with a PMH of chronic combined CHF (EF 20-25%), permanent atrial fibrillation, non-obstructive CAD noted on coronary CTA 09/2018, LA mass c/f thrombus on echo 04/2019 though no evidence on recent cardiac MRI, HTN, HLD, CKD stage 3, and remote breast cancer who presents for post hospital follow-up of CHF.  Patient has had two admissions this month for acute on chronic combined CHF. She was admitted to the hospital 04/09/2019-04/15/2019 after presenting with SOB where she was found to have an EF of 20-25% on echocardiogram. She was started on entresto and diuresed with IV lasix. She was felt to be dry on the day of discharge therefore lasix was held. Unfortunately she was readmitted 04/18/2019-04/21/2019 with recurrent SOB where BNP was up to 1600s from 1300s the previous admission. She underwent a cardiac MRI to evaluate echo findings of a possible LA thrombus/mass which did not reveal a mass but confirmed severe LV systolic dysfunction and moderate RV systolic dysfunction. Delene Loll was held at discharge due to AKI and she was sent home on lasix 40mg  daily.   She returns today for post-hospital follow-up. She reports ongoing fatigue. Breathing is back to baseline and she has not had any LE edema since discharge from the hospital. She denies chest pain, dizziness, lightheadedness, syncope, orthopnea, PND, or palpitations. We discussed plans to repeat an echocardiogram in 2-3 months to monitor for improvement in EF, however if no improvement, may need to consider ICD.     Past Medical History:  Diagnosis Date   Acute lymphadenitis of arm    Anemia    iron deficiency   Atrial  fibrillation ([place])    Breast cancer ([place])    Depression 2009   grief   Diverticulosis of colon 2004   Dr Deatra Ina   GERD (gastroesophageal reflux disease)    Heart murmur    Helicobacter pylori gastritis    Hematuria    Dr Terance Hart   Hyperlipidemia    Hypertension    Osteoarthritis, knee    Skin cancer    Vitamin B12 deficiency    Vitamin D deficiency     Past Surgical History:  Procedure Laterality Date   ABDOMINAL HYSTERECTOMY     CARDIOVERSION N/A 09/06/2018   Procedure: CARDIOVERSION;  Surgeon: Fay Records, MD;  Location: [facility] ENDOSCOPY;  Service: Cardiovascular;  Laterality: N/A; --> after initial success with sinus rhythm, the patient went back into A. fib within 2 minutes.   CORONARY CT ANGIOGRAM  09/14/2018   Coronary Calcium Score 276.  Mild mixed plaque in the left main with minimal stenosis.  Mixed plaque in the ostial LAD, proximal LCx and moderate size OM1, as well as proximal RCA (<50% stenosis throughout.).  Nonobstructive CAD.  Intermediate risk   JOINT REPLACEMENT     L groin skin cancer  2011   MASTECTOMY  09-13-08   left and right   TOTAL KNEE ARTHROPLASTY  06-04-98   TOTAL KNEE ARTHROPLASTY Right 05/24/2014   Procedure: RIGHT TOTAL KNEE ARTHROPLASTY;  Surgeon: Newt Minion, MD;  Location: [place];  Service: Orthopedics;  Laterality: Right;   TRANSTHORACIC ECHOCARDIOGRAM  08/11/2018  Persistent A. fib: Mildly reduced EF of 45 to 50% but diffuse HK.  If A. fib the entire time.  Moderate LA and moderate to severe RA dilation (suggesting longstanding A. fib, and potentially explaining difficulty cardioversion).  No significant valve disease.  Aortic sclerosis no stenosis.  Unable to assess diastolic function with A. fib     Current Outpatient Medications  Medication Sig Dispense Refill   acetaminophen (TYLENOL) 500 MG tablet Take 500 mg by mouth 2 (two) times daily as needed for moderate pain.      apixaban (ELIQUIS) 2.5 MG TABS tablet Take 1  tablet (2.5 mg total) by mouth 2 (two) times daily. 60 tablet 0   apixaban (ELIQUIS) 5 MG TABS tablet Take 5 mg by mouth 2 (two) times daily.     [facility] B-12 500 MCG SUBL Place 2 tablets (1,000 mcg total) under the tongue daily. 200 tablet 3   diphenhydrAMINE (SOMINEX) 25 MG tablet Take 25 mg by mouth at bedtime.      furosemide (LASIX) 40 MG tablet Take 1 tablet (40 mg total) by mouth daily. 30 tablet 0   metoprolol succinate (TOPROL-XL) 25 MG 24 hr tablet Take 1 tablet (25 mg total) by mouth daily. 60 tablet 0   Multiple Vitamin (MULTIVITAMIN WITH MINERALS) TABS tablet Take 1 tablet by mouth daily.     pantoprazole (PROTONIX) 40 MG tablet Take 1 tablet (40 mg total) by mouth 2 (two) times daily. 60 tablet 5   potassium chloride SA (KLOR-CON M20) 20 MEQ tablet Take 1 tablet (20 mEq total) by mouth 2 (two) times daily. 180 tablet 3   pravastatin (PRAVACHOL) 20 MG tablet Take 1 tablet (20 mg total) by mouth daily. 90 tablet 3   zolpidem (AMBIEN) 5 MG tablet Take 0.5-1 tablets (2.5-5 mg total) by mouth at bedtime as needed for sleep (insomnia). 30 tablet 2   No current facility-administered medications for this visit.     Allergies:   Anastrozole, Aspirin, Ciprofloxacin, and Letrozole    Social History:  The patient  reports that she has never smoked. She has never used smokeless tobacco. She reports that she does not drink alcohol or use drugs.   Family History:  The patient's family history includes Atrial fibrillation in an other family member; Cancer in her maternal aunt, sister, and another family member; Coronary artery disease in an other family member.    ROS:  Please see the history of present illness.   Otherwise, review of systems are positive for none.   All other systems are reviewed and negative.    PHYSICAL EXAM: VS:  BP 102/70    Pulse 76    Ht 5\' 6"  (1.676 m)    Wt 180 lb (81.6 kg)    SpO2 95%    BMI 29.05 kg/m  , BMI Body mass index is 29.05 kg/m. GEN: Well  nourished, well developed, in no acute distress HEENT: sclera anicteric Neck: no JVD, carotid bruits, or masses Cardiac: IRIR; no murmurs, rubs, or gallops, no edema  Respiratory:  clear to auscultation bilaterally, normal work of breathing GI: soft, nontender, nondistended, + BS MS: no deformity or atrophy Skin: warm and dry, no rash Neuro:  Strength and sensation are intact Psych: euthymic mood, full affect   EKG:  EKG is not ordered today.  Recent Labs: 04/09/2019: ALT 28; TSH 2.691 04/18/2019: B Natriuretic Peptide 1,606.2 04/19/2019: Hemoglobin 12.9; Magnesium 1.8; Platelets 234 04/25/2019: BUN 25; Creatinine, Ser 1.38; Potassium 3.7; Sodium 139  Lipid Panel    Component Value Date/Time   CHOL 210 (H) 04/10/2019 0730   TRIG 51 04/10/2019 0730   HDL 62 04/10/2019 0730   CHOLHDL 3.4 04/10/2019 0730   VLDL 10 04/10/2019 0730   LDLCALC 138 (H) 04/10/2019 0730      Wt Readings from Last 3 Encounters:  04/26/19 180 lb (81.6 kg)  04/25/19 180 lb (81.6 kg)  04/21/19 178 lb 9.6 oz (81 kg)      Other studies Reviewed: Additional studies/ records that were reviewed today include:   Echocardiogram 04/10/2019: 1. The left ventricle has severely reduced systolic function, with an ejection fraction of 20-25%. The cavity size was mildly dilated. Left ventricular diastolic function could not be evaluated secondary to atrial fibrillation. Elevated left atrial and  left ventricular end-diastolic pressures Left ventricular diffuse hypokinesis. 2. There is akinesis of the basal and mid infero and anteroseptum, apical septum, apical anterior and apical inferolaterl akinesis. 3. The right ventricle has moderately reduced systolic function. The cavity was severely enlarged. There is no increase in right ventricular wall thickness. Right ventricular systolic pressure is moderately elevated at 73mmhg. 4. Left atrial size was severely dilated. 5. There is a 2.3 x 1.43cm mass in the  posterior LA of uncler significance. Cannot rule out thrombus. Consider TEE if clinically indicated. 6. Right atrial size was severely dilated. 7. Mild thickening of the mitral valve leaflet. Mitral valve regurgitation is mild to moderate by color flow Doppler. 8. The aortic valve is tricuspid. Moderate sclerosis of the aortic valve. Aortic valve regurgitation was not assessed by color flow Doppler. Moderate aortic annular calcification noted. 9. Tricuspid valve regurgitation is moderate. 10. The aorta is normal in size and structure. 11. The inferior vena cava was dilated in size with <50% respiratory variability.  CMR 04/19/2019: 1. No left atrial mass visualized 2. Severe LV dilatation with severe global systolic dysfunction (EF 123XX123) 3. Mild RV dilatation with moderate systolic dysfunction (EF A999333) 4. Basal LV septal midwall stripe of late gadolinium enhancement. This scar pattern is seen in nonischemic cardiomyopathies and portends a worse prognosis  Coronary CTA 09/2018: IMPRESSION: 1. Coronary artery calcium score 276 Agatston units, placing the patient in the 63rd percentile for age and gender, suggesting intermediate risk for future cardiac events.  2.  Nonobstructive coronary disease.    ASSESSMENT AND PLAN:  1. Chronic combined CHF/Non-ischemic cardiomyopathy: 2 recent admissions for acute on chronic CHF. EF 20-25% on echo, confirmed on cardiac MR which also showed moderate RV systolic dysfunction. Started on entresto which was discontinued due to AKI. Diuresed with IV lasix with transition to po 40mg  daily at discharge. Cr improved to 1.38 but not quite at baseline of 1.0.  - Continue lasix 40mg  daily - Continue metoprolol succinate 25mg  daily - Cr is improved, however BP soft today limiting addition of entresto +/- spironolactone - Patient instructed to get a blood pressure cuff to monitor her BP over the next few weeks before her f/u visit with Dr. Ellyn Hack to determine  if entresto can be restarted. - Plan to repeat echo in 2-3 months to monitor for improvement in EF and consider referral to EP for ICD consideration if EF remains low.   2. Non-obstructive CAD: Coronary CTA 09/2018 with non-obstructive disease. Not on aspirin given need for anticoagulation - Continue statin and metoprolol succinate  3. Permanent atrial fibrillation: Rates well controlled on metoprolol succinate - Continue metoprolol succinate 25mg  daily for rate control - Continue eliquis for stroke ppx  given CHA2DS2-VASc Score of 5 (CHF, Vasc, Female, Age >59).  Cr is improved to 1.38, therefore will increase to 5mg  BID as she does not meet criteria for reduced dose.   4. HLD: LDL 138 04/10/2019. Pravastatin increased during recent admission - Will need repeat LFTs/FLP in 1 month  - Continue pravastatin  5. Acute on chronic renal insufficiency stage 3: baseline Cr 1.0, peaked at 1.56 during recent admission and improved to 1.38 04/25/2019    Current medicines are reviewed at length with the patient today.  The patient does not have concerns regarding medicines.  The following changes have been made:  no change  Labs/ tests ordered today include: None No orders of the defined types were placed in this encounter.    Disposition:   FU with Dr. Ellyn Hack 05/20/2019 as previously scheduled.    Signed, Abigail Butts, PA-C  04/26/2019 11:47 AM

## 2019-04-25 NOTE — Assessment & Plan Note (Signed)
Labs

## 2019-04-26 ENCOUNTER — Ambulatory Visit (INDEPENDENT_AMBULATORY_CARE_PROVIDER_SITE_OTHER): Payer: Medicare Other | Admitting: Medical

## 2019-04-26 ENCOUNTER — Encounter: Payer: Self-pay | Admitting: Medical

## 2019-04-26 VITALS — BP 102/70 | HR 76 | Ht 66.0 in | Wt 180.0 lb

## 2019-04-26 DIAGNOSIS — I5042 Chronic combined systolic (congestive) and diastolic (congestive) heart failure: Secondary | ICD-10-CM | POA: Diagnosis not present

## 2019-04-26 DIAGNOSIS — E785 Hyperlipidemia, unspecified: Secondary | ICD-10-CM

## 2019-04-26 DIAGNOSIS — N179 Acute kidney failure, unspecified: Secondary | ICD-10-CM

## 2019-04-26 DIAGNOSIS — I10 Essential (primary) hypertension: Secondary | ICD-10-CM

## 2019-04-26 DIAGNOSIS — I4821 Permanent atrial fibrillation: Secondary | ICD-10-CM

## 2019-04-26 DIAGNOSIS — I2510 Atherosclerotic heart disease of native coronary artery without angina pectoris: Secondary | ICD-10-CM | POA: Diagnosis not present

## 2019-04-26 NOTE — Telephone Encounter (Signed)
Ok Thx 

## 2019-04-26 NOTE — Telephone Encounter (Signed)
Verbal orders given for below.  

## 2019-04-26 NOTE — Patient Instructions (Signed)
Medication Instructions:   Take Eliquis 5 mg 2 times a day  If you need a refill on your cardiac medications before your next appointment, please call your pharmacy.   Lab work: NONE ordered at this time of appointment   If you have labs (blood work) drawn today and your tests are completely normal, you will receive your results only by: Marland Kitchen MyChart Message (if you have MyChart) OR . A paper copy in the mail If you have any lab test that is abnormal or we need to change your treatment, we will call you to review the results.  Testing/Procedures: NONE ordered at this time of appointment   Follow-Up: At [facility], you and your health needs are our priority.  As part of our continuing mission to provide you with exceptional heart care, we have created designated Provider Care Teams.  These Care Teams include your primary Cardiologist (physician) and Advanced Practice Providers (APPs -  Physician Assistants and Nurse Practitioners) who all work together to provide you with the care you need, when you need it. Marland Kitchen You will need to keep your scheduled appointment with [name] Hew, MD for May 20, 2019  Any Other Special Instructions Will Be Listed Below (If Applicable).

## 2019-04-27 ENCOUNTER — Ambulatory Visit: Payer: Self-pay | Admitting: Pharmacist

## 2019-04-27 ENCOUNTER — Other Ambulatory Visit: Payer: Self-pay | Admitting: Pharmacist

## 2019-04-27 NOTE — Patient Outreach (Signed)
[facility]) Care Management  [place]   04/27/2019  POLLYANN COCKE 1932-10-05 [id number]  Reason for referral: Medication Management  Referral source: [facility] Inpatient Liaison Current insurance: [facility]  PMHx includes but not limited to:  Atrial fibrillation on chronic anticoagulation, HTN, HLD, non-obstructive CAD, B12 deficiency, anemia, CKD-III, dysphagia, HFrEF (EF20-25% 8/9/'20), left atrial mass noted on TTE, remote breast cancer, 2 recent hospitalizations for acute CHF exacerbation.  Entresto initially added, then stopped due to soft BPs.    Outreach:  Successful telephone call with Ms. Eckhardt.  HIPAA identifiers verified.   Subjective:  Patient reports she is doing better today and has seen both her PCP and heart doctor this week.  She reports her Eliquis dose was changed yesterday during office visit.  She has been on 5mg  BID, dose reduced during last admission due to AKI, now increased back to 5mg  BID.  She reports she still has bottle of 5mg  strength and has resumed taking it today.  She states she has an Therapist, sports from [facility] coming to visit her today.  She was told by heart doctor that she needs a scale and blood pressure machine for home use. She states her son helps her with her medications and picks up all refills from the pharmacy for her.  She states the [facility] RN is also bringing her a pillbox to start using.  She denies having any issues with cost of medication.   Objective: The ASCVD Risk score Mikey Bussing DC Jr., et al., 2013) failed to calculate for the following reasons:   The 2013 ASCVD risk score is only valid for ages 52 to 32  Lab Results  Component Value Date   CREATININE 1.38 (H) 04/25/2019   CREATININE 1.56 (H) 04/21/2019   CREATININE 1.47 (H) 04/20/2019    Lab Results  Component Value Date   HGBA1C 5.6 04/10/2019    Lipid Panel     Component Value Date/Time   CHOL 210 (H) 04/10/2019 0730   TRIG 51 04/10/2019 0730   HDL 62  04/10/2019 0730   CHOLHDL 3.4 04/10/2019 0730   VLDL 10 04/10/2019 0730   LDLCALC 138 (H) 04/10/2019 0730    BP Readings from Last 3 Encounters:  04/26/19 102/70  04/25/19 130/80  04/21/19 115/65    Allergies  Allergen Reactions  . Anastrozole Other (See Comments)    arthralgias  . Aspirin Other (See Comments)    Gi upset   . Ciprofloxacin Itching  . Letrozole Other (See Comments)    leg pain    Medications Reviewed Today    Reviewed by Abigail Butts PA-C (Physician Assistant Certified) on [id number] at 1141  Med List Status: <None>  Medication Order Taking? Sig Documenting Provider Last Dose Status Informant  acetaminophen (TYLENOL) 500 MG tablet [id number] Yes Take 500 mg by mouth 2 (two) times daily as needed for moderate pain.  [provider] Taking Active Self  apixaban (ELIQUIS) 2.5 MG TABS tablet [id number] Yes Take 1 tablet (2.5 mg total) by mouth 2 (two) times daily. Desiree Hane, MD Taking Consider Medication Status and Discontinue (Change in therapy)   apixaban (ELIQUIS) 5 MG TABS tablet [id number] Yes Take 5 mg by mouth 2 (two) times daily. [provider] Taking Active   [employer] B-12 500 MCG SUBL [id number] Yes Place 2 tablets (1,000 mcg total) under the tongue daily. Plotnikov, Evie Lacks, MD Taking Active Self  diphenhydrAMINE (SOMINEX) 25 MG tablet [id number] Yes Take 25 mg  by mouth at bedtime.  [provider] Taking Active Self  furosemide (LASIX) 40 MG tablet [id number] Yes Take 1 tablet (40 mg total) by mouth daily. Desiree Hane, MD Taking Active   metoprolol succinate (TOPROL-XL) 25 MG 24 hr tablet [id number] Yes Take 1 tablet (25 mg total) by mouth daily. Desiree Hane, MD Taking Active   Multiple Vitamin (MULTIVITAMIN WITH MINERALS) TABS tablet [id number] Yes Take 1 tablet by mouth daily. [provider] Taking Active Self  pantoprazole (PROTONIX) 40 MG tablet [id number] Yes Take 1 tablet (40 mg total) by mouth 2 (two)  times daily. Plotnikov, Evie Lacks, MD Taking Active Self  potassium chloride SA (KLOR-CON M20) 20 MEQ tablet [id number] Yes Take 1 tablet (20 mEq total) by mouth 2 (two) times daily. Plotnikov, Evie Lacks, MD Taking Active Self  pravastatin (PRAVACHOL) 20 MG tablet [id number] Yes Take 1 tablet (20 mg total) by mouth daily. Plotnikov, Evie Lacks, MD Taking Active Self  zolpidem (AMBIEN) 5 MG tablet [id number] Yes Take 0.5-1 tablets (2.5-5 mg total) by mouth at bedtime as needed for sleep (insomnia). Plotnikov, Evie Lacks, MD Taking Active Self          Assessment: Drugs sorted by system:  Neurologic/Psychologic: diphenhydramine, zolpidem  Hematologic: apixaban  Cardiovascular:  furosemide, metoprolol succinate, pravastatin, potassium   Gastrointestinal: pantoprazole  Pain: acetaminophen  Vitamins/Minerals/Supplements: vitamin B-12  Medication Review Findings:   Zolpidem: This medication is on the [place] list and should be avoided in patients 78 and older due to adverse events (eg, delirium, falls, fractures). Avoid use in patients with dementia, delirium, or history of falls or fractures. Marland Kitchen Apixaban: reviewed how to monitor signs / symptoms of bleeding, reviewed dosing change from cardiologist  Medication Assistance Findings:  No medication assistance needs identified  Additional medication assistance options reviewed with patient as warranted:  Insurance OTC catalogue - reviewed benefits of catalogue.  Patient states she has catalogue at home and understands how to use it to purchase BP machine, OTC MVI, supplements, or other needs.    Plan: . Will send patient an AM/PM pillbox . Will route note to [facility] RN  . Will close [facility] pharmacy case as no medication needs at this time. Provided my phone number to patient in case she needs to reach out to me in the future.   Ralene Bathe, PharmD, [place] [phone]

## 2019-04-28 DIAGNOSIS — I4821 Permanent atrial fibrillation: Secondary | ICD-10-CM | POA: Diagnosis not present

## 2019-04-28 DIAGNOSIS — G510 Bell's palsy: Secondary | ICD-10-CM | POA: Diagnosis not present

## 2019-04-28 DIAGNOSIS — Z85828 Personal history of other malignant neoplasm of skin: Secondary | ICD-10-CM | POA: Diagnosis not present

## 2019-04-28 DIAGNOSIS — G4700 Insomnia, unspecified: Secondary | ICD-10-CM | POA: Diagnosis not present

## 2019-04-28 DIAGNOSIS — D509 Iron deficiency anemia, unspecified: Secondary | ICD-10-CM | POA: Diagnosis not present

## 2019-04-28 DIAGNOSIS — R1310 Dysphagia, unspecified: Secondary | ICD-10-CM | POA: Diagnosis not present

## 2019-04-28 DIAGNOSIS — K219 Gastro-esophageal reflux disease without esophagitis: Secondary | ICD-10-CM | POA: Diagnosis not present

## 2019-04-28 DIAGNOSIS — I5023 Acute on chronic systolic (congestive) heart failure: Secondary | ICD-10-CM | POA: Diagnosis not present

## 2019-04-28 DIAGNOSIS — N183 Chronic kidney disease, stage 3 (moderate): Secondary | ICD-10-CM | POA: Diagnosis not present

## 2019-04-28 DIAGNOSIS — I890 Lymphedema, not elsewhere classified: Secondary | ICD-10-CM | POA: Diagnosis not present

## 2019-04-28 DIAGNOSIS — Z96651 Presence of right artificial knee joint: Secondary | ICD-10-CM | POA: Diagnosis not present

## 2019-04-28 DIAGNOSIS — Z87440 Personal history of urinary (tract) infections: Secondary | ICD-10-CM | POA: Diagnosis not present

## 2019-04-28 DIAGNOSIS — E538 Deficiency of other specified B group vitamins: Secondary | ICD-10-CM | POA: Diagnosis not present

## 2019-04-28 DIAGNOSIS — J449 Chronic obstructive pulmonary disease, unspecified: Secondary | ICD-10-CM | POA: Diagnosis not present

## 2019-04-28 DIAGNOSIS — I2510 Atherosclerotic heart disease of native coronary artery without angina pectoris: Secondary | ICD-10-CM | POA: Diagnosis not present

## 2019-04-28 DIAGNOSIS — E785 Hyperlipidemia, unspecified: Secondary | ICD-10-CM | POA: Diagnosis not present

## 2019-04-28 DIAGNOSIS — K5732 Diverticulitis of large intestine without perforation or abscess without bleeding: Secondary | ICD-10-CM | POA: Diagnosis not present

## 2019-04-28 DIAGNOSIS — Z853 Personal history of malignant neoplasm of breast: Secondary | ICD-10-CM | POA: Diagnosis not present

## 2019-04-28 DIAGNOSIS — E559 Vitamin D deficiency, unspecified: Secondary | ICD-10-CM | POA: Diagnosis not present

## 2019-04-28 DIAGNOSIS — I130 Hypertensive heart and chronic kidney disease with heart failure and stage 1 through stage 4 chronic kidney disease, or unspecified chronic kidney disease: Secondary | ICD-10-CM | POA: Diagnosis not present

## 2019-04-28 DIAGNOSIS — Z7901 Long term (current) use of anticoagulants: Secondary | ICD-10-CM | POA: Diagnosis not present

## 2019-04-28 DIAGNOSIS — M1710 Unilateral primary osteoarthritis, unspecified knee: Secondary | ICD-10-CM | POA: Diagnosis not present

## 2019-05-03 ENCOUNTER — Other Ambulatory Visit: Payer: Self-pay | Admitting: *Deleted

## 2019-05-03 DIAGNOSIS — Z7901 Long term (current) use of anticoagulants: Secondary | ICD-10-CM

## 2019-05-03 DIAGNOSIS — Z96651 Presence of right artificial knee joint: Secondary | ICD-10-CM

## 2019-05-03 DIAGNOSIS — I4821 Permanent atrial fibrillation: Secondary | ICD-10-CM

## 2019-05-03 DIAGNOSIS — K5732 Diverticulitis of large intestine without perforation or abscess without bleeding: Secondary | ICD-10-CM

## 2019-05-03 DIAGNOSIS — I2510 Atherosclerotic heart disease of native coronary artery without angina pectoris: Secondary | ICD-10-CM | POA: Diagnosis not present

## 2019-05-03 DIAGNOSIS — E538 Deficiency of other specified B group vitamins: Secondary | ICD-10-CM

## 2019-05-03 DIAGNOSIS — G4700 Insomnia, unspecified: Secondary | ICD-10-CM

## 2019-05-03 DIAGNOSIS — R32 Unspecified urinary incontinence: Secondary | ICD-10-CM

## 2019-05-03 DIAGNOSIS — J449 Chronic obstructive pulmonary disease, unspecified: Secondary | ICD-10-CM

## 2019-05-03 DIAGNOSIS — Z9181 History of falling: Secondary | ICD-10-CM

## 2019-05-03 DIAGNOSIS — R1310 Dysphagia, unspecified: Secondary | ICD-10-CM

## 2019-05-03 DIAGNOSIS — E559 Vitamin D deficiency, unspecified: Secondary | ICD-10-CM

## 2019-05-03 DIAGNOSIS — Z853 Personal history of malignant neoplasm of breast: Secondary | ICD-10-CM

## 2019-05-03 DIAGNOSIS — M1710 Unilateral primary osteoarthritis, unspecified knee: Secondary | ICD-10-CM

## 2019-05-03 DIAGNOSIS — E785 Hyperlipidemia, unspecified: Secondary | ICD-10-CM

## 2019-05-03 DIAGNOSIS — I5023 Acute on chronic systolic (congestive) heart failure: Secondary | ICD-10-CM | POA: Diagnosis not present

## 2019-05-03 DIAGNOSIS — Z85828 Personal history of other malignant neoplasm of skin: Secondary | ICD-10-CM

## 2019-05-03 DIAGNOSIS — I130 Hypertensive heart and chronic kidney disease with heart failure and stage 1 through stage 4 chronic kidney disease, or unspecified chronic kidney disease: Secondary | ICD-10-CM | POA: Diagnosis not present

## 2019-05-03 DIAGNOSIS — G510 Bell's palsy: Secondary | ICD-10-CM

## 2019-05-03 DIAGNOSIS — D509 Iron deficiency anemia, unspecified: Secondary | ICD-10-CM

## 2019-05-03 DIAGNOSIS — F329 Major depressive disorder, single episode, unspecified: Secondary | ICD-10-CM

## 2019-05-03 DIAGNOSIS — I890 Lymphedema, not elsewhere classified: Secondary | ICD-10-CM

## 2019-05-03 DIAGNOSIS — K219 Gastro-esophageal reflux disease without esophagitis: Secondary | ICD-10-CM

## 2019-05-03 DIAGNOSIS — Z87440 Personal history of urinary (tract) infections: Secondary | ICD-10-CM

## 2019-05-03 DIAGNOSIS — N183 Chronic kidney disease, stage 3 (moderate): Secondary | ICD-10-CM | POA: Diagnosis not present

## 2019-05-03 NOTE — Patient Outreach (Signed)
[place] [facility]) Care Management  05/03/2019  [name] [name] 07/22/1933 [id number]   Call placed to member to follow up on heart failure management.  She denies any shortness of breath or chest discomfort.  Denies having any swelling in her legs/feet.  State she received a scale and blood pressure machine in the mail.  Has been checking her weight daily, denies any weight gain.  Report taking medications as prescribed, still working on appropriate diet.  State appointments with primary MD and cardiology last week went well, no issues noted by MD.  Denies any urgent concerns at this time, will follow up within the next 3 weeks.  [facility] CM Care Plan Problem One     Most Recent Value  Care Plan Problem One  Knowledge deficit related to heart failure management as evidenced by 2 hospital admissions over the last 2 weeks  Role Documenting the Problem One  Care Management [place] for Problem One  Active  [facility] Long Term Goal   Member will not be readmitted tohospital within the next 31 days  [facility] Long Term Goal Start Date  04/22/19  Interventions for Problem One Long Term Goal  Reviewed heart failure zones with member. Mailed EMMI heart failure education  [facility] CM Short Term Goal #1   Member will report taking and recording weights daily over the next 4 weeks  [facility] CM Short Term Goal #1 Start Date  04/22/19  Interventions for Short Term Goal #1  Confirmed with member that she is now enrolled with [employer] heart failure program and that she now has working scale and blood pressure machine  [facility] CM Short Term Goal #2   Member will keep and attend follow up appointments with primary MD and cardiologist within the next week  [facility] CM Short Term Goal #2 Start Date  04/22/19  [facility] CM Short Term Goal #2 Met Date  05/03/19     Valente David, RN, MSN [place] Manager [phone]

## 2019-05-05 ENCOUNTER — Telehealth: Payer: Self-pay | Admitting: Cardiology

## 2019-05-05 DIAGNOSIS — Z96651 Presence of right artificial knee joint: Secondary | ICD-10-CM | POA: Diagnosis not present

## 2019-05-05 DIAGNOSIS — I130 Hypertensive heart and chronic kidney disease with heart failure and stage 1 through stage 4 chronic kidney disease, or unspecified chronic kidney disease: Secondary | ICD-10-CM | POA: Diagnosis not present

## 2019-05-05 DIAGNOSIS — K219 Gastro-esophageal reflux disease without esophagitis: Secondary | ICD-10-CM | POA: Diagnosis not present

## 2019-05-05 DIAGNOSIS — G4700 Insomnia, unspecified: Secondary | ICD-10-CM | POA: Diagnosis not present

## 2019-05-05 DIAGNOSIS — M1710 Unilateral primary osteoarthritis, unspecified knee: Secondary | ICD-10-CM | POA: Diagnosis not present

## 2019-05-05 DIAGNOSIS — I5023 Acute on chronic systolic (congestive) heart failure: Secondary | ICD-10-CM | POA: Diagnosis not present

## 2019-05-05 DIAGNOSIS — G510 Bell's palsy: Secondary | ICD-10-CM | POA: Diagnosis not present

## 2019-05-05 DIAGNOSIS — I890 Lymphedema, not elsewhere classified: Secondary | ICD-10-CM | POA: Diagnosis not present

## 2019-05-05 DIAGNOSIS — I4821 Permanent atrial fibrillation: Secondary | ICD-10-CM | POA: Diagnosis not present

## 2019-05-05 DIAGNOSIS — E559 Vitamin D deficiency, unspecified: Secondary | ICD-10-CM | POA: Diagnosis not present

## 2019-05-05 DIAGNOSIS — J449 Chronic obstructive pulmonary disease, unspecified: Secondary | ICD-10-CM | POA: Diagnosis not present

## 2019-05-05 DIAGNOSIS — Z87440 Personal history of urinary (tract) infections: Secondary | ICD-10-CM | POA: Diagnosis not present

## 2019-05-05 DIAGNOSIS — Z7901 Long term (current) use of anticoagulants: Secondary | ICD-10-CM | POA: Diagnosis not present

## 2019-05-05 DIAGNOSIS — R1310 Dysphagia, unspecified: Secondary | ICD-10-CM | POA: Diagnosis not present

## 2019-05-05 DIAGNOSIS — K5732 Diverticulitis of large intestine without perforation or abscess without bleeding: Secondary | ICD-10-CM | POA: Diagnosis not present

## 2019-05-05 DIAGNOSIS — D509 Iron deficiency anemia, unspecified: Secondary | ICD-10-CM | POA: Diagnosis not present

## 2019-05-05 DIAGNOSIS — Z85828 Personal history of other malignant neoplasm of skin: Secondary | ICD-10-CM | POA: Diagnosis not present

## 2019-05-05 DIAGNOSIS — I2510 Atherosclerotic heart disease of native coronary artery without angina pectoris: Secondary | ICD-10-CM | POA: Diagnosis not present

## 2019-05-05 DIAGNOSIS — E785 Hyperlipidemia, unspecified: Secondary | ICD-10-CM | POA: Diagnosis not present

## 2019-05-05 DIAGNOSIS — Z853 Personal history of malignant neoplasm of breast: Secondary | ICD-10-CM | POA: Diagnosis not present

## 2019-05-05 DIAGNOSIS — E538 Deficiency of other specified B group vitamins: Secondary | ICD-10-CM | POA: Diagnosis not present

## 2019-05-05 DIAGNOSIS — N183 Chronic kidney disease, stage 3 (moderate): Secondary | ICD-10-CM | POA: Diagnosis not present

## 2019-05-05 NOTE — Telephone Encounter (Signed)
Dr Allison Quarry pt [name] [name]

## 2019-05-05 NOTE — Telephone Encounter (Signed)
Sorry, thank you for the correction.

## 2019-05-05 NOTE — Telephone Encounter (Signed)
Nurse states that patient is having more palpitations, starting last night, and also some increased leg swelling, denies weight gain or swelling,  124/60 sitting bp  142/80 standing bp  70 HR - irregular. noticed today.  Trying to follow restrictions with fluid- and she feels she may not be getting enough.   Nurse wanted to make Dr.Crenshaw.

## 2019-05-05 NOTE — Telephone Encounter (Signed)
New message:     Nurse is at pt's home now, would like for a nurse to call her please.

## 2019-05-07 NOTE — Telephone Encounter (Signed)
Hard to know what is going on -- Would like to have her restart spironolactone @ 12.5 mg (I think that she should have - if not. Rx Spironolactone 12.5 mg PO daliy, Disp #90, refill 4)  Also - if palpitations are getting worse, take an additional dose of Metoprolol ~12 hr after the usual dose.  Glenetta Hew, MD

## 2019-05-10 DIAGNOSIS — N183 Chronic kidney disease, stage 3 (moderate): Secondary | ICD-10-CM | POA: Diagnosis not present

## 2019-05-10 DIAGNOSIS — Z85828 Personal history of other malignant neoplasm of skin: Secondary | ICD-10-CM | POA: Diagnosis not present

## 2019-05-10 DIAGNOSIS — I4821 Permanent atrial fibrillation: Secondary | ICD-10-CM | POA: Diagnosis not present

## 2019-05-10 DIAGNOSIS — G4700 Insomnia, unspecified: Secondary | ICD-10-CM | POA: Diagnosis not present

## 2019-05-10 DIAGNOSIS — K5732 Diverticulitis of large intestine without perforation or abscess without bleeding: Secondary | ICD-10-CM | POA: Diagnosis not present

## 2019-05-10 DIAGNOSIS — I2510 Atherosclerotic heart disease of native coronary artery without angina pectoris: Secondary | ICD-10-CM | POA: Diagnosis not present

## 2019-05-10 DIAGNOSIS — R1310 Dysphagia, unspecified: Secondary | ICD-10-CM | POA: Diagnosis not present

## 2019-05-10 DIAGNOSIS — Z96651 Presence of right artificial knee joint: Secondary | ICD-10-CM | POA: Diagnosis not present

## 2019-05-10 DIAGNOSIS — I5023 Acute on chronic systolic (congestive) heart failure: Secondary | ICD-10-CM | POA: Diagnosis not present

## 2019-05-10 DIAGNOSIS — I130 Hypertensive heart and chronic kidney disease with heart failure and stage 1 through stage 4 chronic kidney disease, or unspecified chronic kidney disease: Secondary | ICD-10-CM | POA: Diagnosis not present

## 2019-05-10 DIAGNOSIS — G510 Bell's palsy: Secondary | ICD-10-CM | POA: Diagnosis not present

## 2019-05-10 DIAGNOSIS — E559 Vitamin D deficiency, unspecified: Secondary | ICD-10-CM | POA: Diagnosis not present

## 2019-05-10 DIAGNOSIS — Z87440 Personal history of urinary (tract) infections: Secondary | ICD-10-CM | POA: Diagnosis not present

## 2019-05-10 DIAGNOSIS — E538 Deficiency of other specified B group vitamins: Secondary | ICD-10-CM | POA: Diagnosis not present

## 2019-05-10 DIAGNOSIS — D509 Iron deficiency anemia, unspecified: Secondary | ICD-10-CM | POA: Diagnosis not present

## 2019-05-10 DIAGNOSIS — Z853 Personal history of malignant neoplasm of breast: Secondary | ICD-10-CM | POA: Diagnosis not present

## 2019-05-10 DIAGNOSIS — Z7901 Long term (current) use of anticoagulants: Secondary | ICD-10-CM | POA: Diagnosis not present

## 2019-05-10 DIAGNOSIS — E785 Hyperlipidemia, unspecified: Secondary | ICD-10-CM | POA: Diagnosis not present

## 2019-05-10 DIAGNOSIS — J449 Chronic obstructive pulmonary disease, unspecified: Secondary | ICD-10-CM | POA: Diagnosis not present

## 2019-05-10 DIAGNOSIS — M1710 Unilateral primary osteoarthritis, unspecified knee: Secondary | ICD-10-CM | POA: Diagnosis not present

## 2019-05-10 DIAGNOSIS — I890 Lymphedema, not elsewhere classified: Secondary | ICD-10-CM | POA: Diagnosis not present

## 2019-05-10 DIAGNOSIS — K219 Gastro-esophageal reflux disease without esophagitis: Secondary | ICD-10-CM | POA: Diagnosis not present

## 2019-05-12 DIAGNOSIS — R1310 Dysphagia, unspecified: Secondary | ICD-10-CM | POA: Diagnosis not present

## 2019-05-12 DIAGNOSIS — I2510 Atherosclerotic heart disease of native coronary artery without angina pectoris: Secondary | ICD-10-CM | POA: Diagnosis not present

## 2019-05-12 DIAGNOSIS — I5023 Acute on chronic systolic (congestive) heart failure: Secondary | ICD-10-CM | POA: Diagnosis not present

## 2019-05-12 DIAGNOSIS — G510 Bell's palsy: Secondary | ICD-10-CM | POA: Diagnosis not present

## 2019-05-12 DIAGNOSIS — Z96651 Presence of right artificial knee joint: Secondary | ICD-10-CM | POA: Diagnosis not present

## 2019-05-12 DIAGNOSIS — Z85828 Personal history of other malignant neoplasm of skin: Secondary | ICD-10-CM | POA: Diagnosis not present

## 2019-05-12 DIAGNOSIS — I130 Hypertensive heart and chronic kidney disease with heart failure and stage 1 through stage 4 chronic kidney disease, or unspecified chronic kidney disease: Secondary | ICD-10-CM | POA: Diagnosis not present

## 2019-05-12 DIAGNOSIS — E559 Vitamin D deficiency, unspecified: Secondary | ICD-10-CM | POA: Diagnosis not present

## 2019-05-12 DIAGNOSIS — D509 Iron deficiency anemia, unspecified: Secondary | ICD-10-CM | POA: Diagnosis not present

## 2019-05-12 DIAGNOSIS — M1710 Unilateral primary osteoarthritis, unspecified knee: Secondary | ICD-10-CM | POA: Diagnosis not present

## 2019-05-12 DIAGNOSIS — J449 Chronic obstructive pulmonary disease, unspecified: Secondary | ICD-10-CM | POA: Diagnosis not present

## 2019-05-12 DIAGNOSIS — K219 Gastro-esophageal reflux disease without esophagitis: Secondary | ICD-10-CM | POA: Diagnosis not present

## 2019-05-12 DIAGNOSIS — G4700 Insomnia, unspecified: Secondary | ICD-10-CM | POA: Diagnosis not present

## 2019-05-12 DIAGNOSIS — I890 Lymphedema, not elsewhere classified: Secondary | ICD-10-CM | POA: Diagnosis not present

## 2019-05-12 DIAGNOSIS — E785 Hyperlipidemia, unspecified: Secondary | ICD-10-CM | POA: Diagnosis not present

## 2019-05-12 DIAGNOSIS — Z87440 Personal history of urinary (tract) infections: Secondary | ICD-10-CM | POA: Diagnosis not present

## 2019-05-12 DIAGNOSIS — Z853 Personal history of malignant neoplasm of breast: Secondary | ICD-10-CM | POA: Diagnosis not present

## 2019-05-12 DIAGNOSIS — N183 Chronic kidney disease, stage 3 (moderate): Secondary | ICD-10-CM | POA: Diagnosis not present

## 2019-05-12 DIAGNOSIS — I4821 Permanent atrial fibrillation: Secondary | ICD-10-CM | POA: Diagnosis not present

## 2019-05-12 DIAGNOSIS — K5732 Diverticulitis of large intestine without perforation or abscess without bleeding: Secondary | ICD-10-CM | POA: Diagnosis not present

## 2019-05-12 DIAGNOSIS — Z7901 Long term (current) use of anticoagulants: Secondary | ICD-10-CM | POA: Diagnosis not present

## 2019-05-12 DIAGNOSIS — E538 Deficiency of other specified B group vitamins: Secondary | ICD-10-CM | POA: Diagnosis not present

## 2019-05-13 ENCOUNTER — Other Ambulatory Visit: Payer: Self-pay | Admitting: Cardiology

## 2019-05-13 ENCOUNTER — Telehealth: Payer: Self-pay | Admitting: Cardiology

## 2019-05-13 NOTE — Telephone Encounter (Signed)
New Message   . *STAT* If patient is at the pharmacy, call can be transferred to refill team.   1. Which medications need to be refilled? (please list name of each medication and dose if known) apixaban (ELIQUIS) 5 MG TABS tablet    2. Which pharmacy/location (including street and city if local pharmacy) is medication to be sent to? [place], [address]  3. Do they need a 30 day or 90 day supply? [place]

## 2019-05-20 ENCOUNTER — Encounter: Payer: Self-pay | Admitting: Cardiology

## 2019-05-20 ENCOUNTER — Other Ambulatory Visit: Payer: Self-pay

## 2019-05-20 ENCOUNTER — Ambulatory Visit (INDEPENDENT_AMBULATORY_CARE_PROVIDER_SITE_OTHER): Payer: Medicare Other | Admitting: Cardiology

## 2019-05-20 VITALS — BP 114/80 | HR 73 | Temp 97.2°F | Ht 66.0 in | Wt 182.0 lb

## 2019-05-20 DIAGNOSIS — I420 Dilated cardiomyopathy: Secondary | ICD-10-CM | POA: Diagnosis not present

## 2019-05-20 DIAGNOSIS — I5042 Chronic combined systolic (congestive) and diastolic (congestive) heart failure: Secondary | ICD-10-CM | POA: Diagnosis not present

## 2019-05-20 DIAGNOSIS — E785 Hyperlipidemia, unspecified: Secondary | ICD-10-CM | POA: Diagnosis not present

## 2019-05-20 DIAGNOSIS — I10 Essential (primary) hypertension: Secondary | ICD-10-CM

## 2019-05-20 DIAGNOSIS — I4821 Permanent atrial fibrillation: Secondary | ICD-10-CM

## 2019-05-20 MED ORDER — SPIRONOLACTONE 25 MG PO TABS: 12.5000 mg | ORAL_TABLET | Freq: Every day | ORAL | 3 refills | Status: AC

## 2019-05-20 MED ORDER — METOPROLOL SUCCINATE ER 25 MG PO TB24: ORAL_TABLET | ORAL | 3 refills | Status: DC

## 2019-05-20 MED ORDER — FUROSEMIDE 40 MG PO TABS: 40.0000 mg | ORAL_TABLET | Freq: Every day | ORAL | 3 refills | Status: AC

## 2019-05-20 MED ORDER — POTASSIUM CHLORIDE CRYS ER 20 MEQ PO TBCR: 20.0000 meq | EXTENDED_RELEASE_TABLET | Freq: Two times a day (BID) | ORAL | 3 refills | Status: DC

## 2019-05-20 NOTE — Progress Notes (Signed)
PCP: Plotnikov, Evie Lacks, MD  Clinic Note: Chief Complaint  Patient presents with  . Hospitalization Follow-up    2nd visit  . Cardiomyopathy    likley non-ischemic - New Rx  . Congestive Heart Failure    chronic combined  . Atrial Fibrillation    persistent/paroxysmal    HPI: [name] [name] is a 85 y.o. female with a cardiac history of A. fib and HFpEF (EF roughly 45%, along with hypertension hyperlipidemia, CKD-3) who presents today for second hospital follow-up visit --> new diagnosis of likely nonischemic cardiomyopathy with EF of 20 to 25% by echo and cardiac MRI.  (Presumed to be nonischemic due to normal coronaries on coronary CTA January 2020).  She has a history of A. fib with unsuccessful cardioversion in January 2020 after initial visit in December 2019.  She did not want to be on antiarrhythmic and we had stopped Multaq.  Is now on Eliquis.  BEVYN DEW was last seen by me on June 29 at which time she noted about 3 days of worsening dyspnea, mostly at night when she would lie down to go to sleep.  She noted dyspnea and wheezing.  Not really exertional dyspnea as much is just at nighttime.  We told her to reduce her amlodipine which helped swelling, but then we also told her to take Lasix and that made her feel better..  --> By time I saw her, she was doing fairly well after taking 3 days of 40 mg Lasix.  Recent Hospitalizations: Unfortunately, shortly thereafter she had 2 back-to-back hospitalizations in August for heart failure  8/8-14/2020: Admitted by the hospitalist service for worsening dyspnea over 2 days with a dry cough.  Had some left-sided chest pain that seem to be somewhat pleuritic.  Had generalized weakness.  Presumed acute on chronic combined heart failure -> found to be acute exacerbation of the systolic heart failure with chronic diastolic failure as echocardiogram revealed EF of 20 to 25%.  There is basal and mid anteroseptal hypo-to akinesis.  -->  IV diuresis at roughly 8 L.  Difficult to titrate CHF meds.  Was tried to continue on metoprolol for rate control along with Entresto.  Amlodipine and losartan stopped.  --> There was a question of left atrial mass with plan to follow-up echo in 3 months.  Lasix was held on discharge because she was thought to be "dry ".  8/17-20/2020: Bounce back admission.  Again she presented with dyspnea weight gain and edema after stopping Lasix. ->  Given additional IV diuretic and discharged on oral Lasix.  Delene Loll was on hold because of acute kidney injury.  Creatinine was 1.5 on discharge.  (Eliquis was decreased to 2.5 mg because of renal insufficiency and age)  Atrial mass was felt to be artifact.  Was not seen on cardiac MRI.  Studies Personally Reviewed - (if available, images/films reviewed: From Epic Chart or Care Everywhere)  Echo 04/10/2019: Global hypokinesis with EF of 20-25%.  Evidence of elevated filling pressures noted -> GRII DD.  Basal and mid inferior-anteroseptal and apical akinesis.  Mildly reduced RV function with moderate elevated RVP ~53 milli-mercury.  Severe biatrial dilation.  Moderate aortic sclerosis but no stenosis.  Dilated IVC with normal respiratory variability.  Possible LAA mass noted.  Cannot exclude thrombus.  Cardiac MRI 04/19/2019: Severe LV dilation with global hypokinesis.  EF roughly 23%.  Moderate RV dilation with moderately reduced EF of 33%.  Basal septal mid wall enhancement suggest scar, which could  be an indication of a worse prognosis for nonischemic cardiomyopathy.    She was just seen by Roby Lofts, PA for initial hospital follow-up on August 25.  She noted little bit of fatigue, but was breathing better.  Minimal edema.  No chest pain or pressure.  No PND orthopnea.  Continued on Toprol 25 mg daily along with Lasix 40 mg daily.  Did not start Entresto or spironolactone because of low blood pressure.  Recommended blood pressure follow-up and keep this  appointment.  Echo ordered for roughly November to reevaluate EF.  Eliquis increased back up to 5 mg twice daily with improved renal function.  (Recheck creatinine 1.38)   Interval History: [name] [name] returns here today for close follow-up did not she actually feeling pretty well and almost close to baseline now.  She says she is back to doing on her housecleaning but will get short of breath if she does too much cleaning.  Her swelling is pretty well controlled but she has had some mild dizziness standing up quickly.  There is been a couple nights when she has had some difficulty breathing, but after she takes Lasix the next day she feels better.  However for the most part, no real PND orthopnea.  No resting exertional chest pain, but she does have some exertional dyspnea with increased activity.  She continues to deny any symptoms of rapid irregular heartbeats or palpitations.  No syncope/near syncope or TIA/amaurosis fugax.  Doing well with normal dose Eliquis.  No bleeding issues: No melena, hematochezia, hematuria, or epstaxis. No claudication.  ROS: A comprehensive was performed. Review of Systems  Constitutional: Negative for malaise/fatigue (Energy level slowly getting better) and weight loss.  HENT: Negative for congestion.   Respiratory: Negative for shortness of breath and wheezing.   Gastrointestinal: Negative for abdominal pain, constipation, heartburn and nausea.  Genitourinary: Negative for dysuria.  Musculoskeletal: Positive for joint pain (Normal mild arthritis). Negative for falls.  Neurological: Positive for dizziness (Sometimes positional with standing up quickly and bending over). Negative for focal weakness and weakness.  Psychiatric/Behavioral: Negative.   All other systems reviewed and are negative.   The patient does not have symptoms concerning for COVID-19 infection (fever, chills, cough, or new shortness of breath).  The patient is practicing social  distancing.   COVID-19 Education: The signs and symptoms of COVID-19 were discussed with the patient and how to seek care for testing (follow up with PCP or arrange E-visit).   The importance of social distancing was discussed today.   I have reviewed and (if needed) personally updated the patient's problem list, medications, allergies, past medical and surgical history, social and family history.   Past Medical History:  Diagnosis Date  . Acute lymphadenitis of arm   . Anemia    iron deficiency  . Atrial fibrillation ([place])   . Breast cancer ([place])   . Chronic combined systolic and diastolic CHF, NYHA class 2 and ACA/AHA stage C ([place]) 04/2019   Previously had mildly reduced EF of 45%, reduced to 20-25% as of August 2020 with global hypokinesis and septal-apical akinesis.  . Depression 2009   grief  . Dilated cardiomyopathy ([place]) 04/2019   Presumed to be nonischemic (normal coronary CTA January 2020).  EF by echo 20 to 25% and by CMR 23%.  Severely reduced RV function at 33% EF.Marland Kitchen Severe biatrial enlargement.  Elevated RV P and LVEDP.  Dilated IVC.Marland Kitchen CMRI suggests septal scar stripe.  . Diverticulosis of colon 2004  Dr Deatra Ina  . GERD (gastroesophageal reflux disease)   . Heart murmur   . Helicobacter pylori gastritis   . Hematuria    Dr Terance Hart  . Hyperlipidemia   . Hypertension   . Osteoarthritis, knee   . Skin cancer   . Vitamin B12 deficiency   . Vitamin D deficiency     Past Surgical History:  Procedure Laterality Date  . ABDOMINAL HYSTERECTOMY    . Cardiac MRI  04/19/2019   Severe LV dilation with global hypokinesis.  EF roughly 23%.  Moderate RV dilation with moderately reduced EF of 33%.  Basal septal mid wall enhancement suggest scar, which could be an indication of a worse prognosis for nonischemic cardiomyopathy.   NO LA mass noted  . CARDIOVERSION N/A 09/06/2018   Procedure: CARDIOVERSION;  Surgeon: Fay Records, MD;  Location: [facility] ENDOSCOPY;  Service:  Cardiovascular;  Laterality: N/A; --> after initial success with sinus rhythm, the patient went back into A. fib within 2 minutes.  . CORONARY CT ANGIOGRAM  09/14/2018   Coronary Calcium Score 276.  Mild mixed plaque in the left main with minimal stenosis.  Mixed plaque in the ostial LAD, proximal LCx and moderate size OM1, as well as proximal RCA (<50% stenosis throughout.).  Nonobstructive CAD.  Intermediate risk  . JOINT REPLACEMENT    . L groin skin cancer  2011  . MASTECTOMY  09-13-08   left and right  . TOTAL KNEE ARTHROPLASTY  06-04-98  . TOTAL KNEE ARTHROPLASTY Right 05/24/2014   Procedure: RIGHT TOTAL KNEE ARTHROPLASTY;  Surgeon: Newt Minion, MD;  Location: [facility];  Service: Orthopedics;  Laterality: Right;  . TRANSTHORACIC ECHOCARDIOGRAM  08/11/2018   Persistent A. fib: Mildly reduced EF of 45 to 50% but diffuse HK.  If A. fib the entire time.  Moderate LA and moderate to severe RA dilation (suggesting longstanding A. fib, and potentially explaining difficulty cardioversion).  No significant valve disease.  Aortic sclerosis no stenosis.  Unable to assess diastolic function with A. fib  . TRANSTHORACIC ECHOCARDIOGRAM  04/10/2019   Global hypokinesis with EF of 20-25%.  Evidence of elevated filling pressures noted -> GRII DD.  Basal and mid inferior-anteroseptal and apical akinesis.  Mildly reduced RV function with moderate elevated RVP ~53 milli-mercury.  Severe biatrial dilation.  Moderate aortic sclerosis but no stenosis.  Dilated IVC with normal respiratory variability.  Possible LAA mass noted.  Cannot exclude thrombus.    Current Meds  Medication Sig  . acetaminophen (TYLENOL) 500 MG tablet Take 500 mg by mouth 2 (two) times daily as needed for moderate pain.   . CVS B-12 500 MCG SUBL Place 2 tablets (1,000 mcg total) under the tongue daily.  . diphenhydrAMINE (SOMINEX) 25 MG tablet Take 25 mg by mouth at bedtime.   Marland Kitchen ELIQUIS 5 MG TABS tablet Take 1 tablet by mouth twice daily  .  furosemide (LASIX) 40 MG tablet Take 1 tablet (40 mg total) by mouth daily. MAY AN ADDITIONAL DOES IF WEIGHT IS GREATER THAN 3 LBS OR AS DIRECTED  . metoprolol succinate (TOPROL-XL) 25 MG 24 hr tablet TAKE 1 TABLET ( 25 MG )  2 HOURS BEFORE BEDTIOME  . Multiple Vitamin (MULTIVITAMIN WITH MINERALS) TABS tablet Take 1 tablet by mouth daily.  . pantoprazole (PROTONIX) 40 MG tablet Take 1 tablet (40 mg total) by mouth 2 (two) times daily.  . potassium chloride SA (KLOR-CON M20) 20 MEQ tablet Take 1 tablet (20 mEq total) by mouth 2 (  two) times daily. TAKE AN ADDITIONAL DOSE IT EXTRA FUROSEMIDE IS TAKEN  . pravastatin (PRAVACHOL) 20 MG tablet Take 1 tablet (20 mg total) by mouth daily.  Marland Kitchen zolpidem (AMBIEN) 5 MG tablet Take 0.5-1 tablets (2.5-5 mg total) by mouth at bedtime as needed for sleep (insomnia).  . [DISCONTINUED] furosemide (LASIX) 40 MG tablet Take 1 tablet (40 mg total) by mouth daily.  . [DISCONTINUED] metoprolol succinate (TOPROL-XL) 25 MG 24 hr tablet Take 1 tablet (25 mg total) by mouth daily.  . [DISCONTINUED] potassium chloride SA (KLOR-CON M20) 20 MEQ tablet Take 1 tablet (20 mEq total) by mouth 2 (two) times daily.    Allergies  Allergen Reactions  . Anastrozole Other (See Comments)    arthralgias  . Aspirin Other (See Comments)    Gi upset   . Ciprofloxacin Itching  . Letrozole Other (See Comments)    leg pain    Social History   Tobacco Use  . Smoking status: Never Smoker  . Smokeless tobacco: Never Used  Substance Use Topics  . Alcohol use: No  . Drug use: No   Social History   Social History Narrative  . Not on file    family history includes Atrial fibrillation in an other family member; Cancer in her maternal aunt, sister, and another family member; Coronary artery disease in an other family member.  Wt Readings from Last 3 Encounters:  05/20/19 182 lb (82.6 kg)  04/26/19 180 lb (81.6 kg)  04/25/19 180 lb (81.6 kg)    PHYSICAL EXAM BP 114/80   Pulse  73   Temp (!) 97.2 F (36.2 C)   Ht 5\' 6"  (1.676 m)   Wt 182 lb (82.6 kg)   SpO2 98%   BMI 29.38 kg/m  Physical Exam  Constitutional: She appears well-developed and well-nourished. No distress.  Well-groomed.  Healthy-appearing  HENT:  Head: Normocephalic and atraumatic.  Neck: Normal range of motion. Neck supple. JVD (Mildly elevated A waves related to A. fib, but no obvious HJR.) present. Carotid bruit is not present.  Cardiovascular: Normal rate, S1 normal, S2 normal and intact distal pulses. An irregularly irregular rhythm present. PMI is displaced (Difficult to palpate, but cannot exclude lateral displacement). Exam reveals distant heart sounds. Exam reveals no gallop and no friction rub.  No murmur heard. Vitals reviewed.    Adult ECG Report  Rate: 73 ;  Rhythm: atrial fibrillation and LVH with repolarization abnormalities (ST and T wave abnormalities with T wave inversions in anterolateral leads).  Also QRS widening/IVCD and left axis deviation (borderline LAFB, -64 degrees);   Narrative Interpretation:    Other studies Reviewed: Additional studies/ records that were reviewed today include:  Recent Labs: 04/10/2019: TC 210, HDL 62, LDL 138, TG 51.  A1c 5.6.  Hgb 12.9.  CR 1.56.  K+ 4.0.  TSH 1.62. Lab Results  Component Value Date   CREATININE 1.38 (H) 04/25/2019   BUN 25 (H) 04/25/2019   NA 139 04/25/2019   K 3.7 04/25/2019   CL 98 04/25/2019   CO2 28 04/25/2019    ASSESSMENT / PLAN: Problem List Items Addressed This Visit    Permanent atrial fibrillation:  CHA2DS2-VASc Score (Eliquis) - Primary (Chronic)    Until this hospitalization, she had been relatively asymptomatic from A. fib standpoint.  She is now maintaining rate controlled A. fib.  She has ha nonspecific IVCD on her EKG but not a full LBBB.  I suspect this is probably related to her cardiomyopathy.  Plan:  Continue current dose of Toprol for rate control along with full dose Eliquis for anticoagulation.   Low threshold to consider EP referral for potential antiarrhythmic agent versus consideration of A. fib ablation.  Also, if EF remains reduced, would need to consider ICD (possible CRT-D)      Relevant Medications   spironolactone (ALDACTONE) 25 MG tablet   metoprolol succinate (TOPROL-XL) 25 MG 24 hr tablet   furosemide (LASIX) 40 MG tablet   Other Relevant Orders   EKG 12-Lead   ECHOCARDIOGRAM COMPLETE   Chronic combined systolic and diastolic heart failure (HCC) (Chronic)    Significant exacerbation of her previously reduced EF based on most recent evaluation with both echo and CMRI. She seems to be relatively euvolemic now so I think we can continue with her current dose of Lasix to 40 mg daily.   Plan: will add spironolactone 12.5 mg daily.    Next step would be to consider adding low-dose ARB if not Entresto.   Continue Toprol as it is also being used for A. fib rate control.  Discussed sliding scale Lasix for weight, edema and dyspnea.  Echo ordered midway through December, will follow after that.  May need to consider referral for A. fib ablation or switching to an antiarrhythmic        Relevant Medications   spironolactone (ALDACTONE) 25 MG tablet   metoprolol succinate (TOPROL-XL) 25 MG 24 hr tablet   furosemide (LASIX) 40 MG tablet   Other Relevant Orders   EKG 12-Lead   ECHOCARDIOGRAM COMPLETE   Dilated cardiomyopathy (HCC) (Chronic)    The fact that she had nonocclusive disease noted on CT scan back in January would suggest that is probably nonischemic cardiomyopathy, however, she did not have a heart catheterization at the time of her CHF hospitalization mostly because of worsening renal function.  There was an unusual basal septal enhancement effect in a striped pattern that tends to tend portend poor prognosis for non-ischemic CM.  Low threshold to refer for ICD if no improvement noted on f/u Echo.  Will gradually try to add on ARB/Entresto (but BP simply  not adequate at this point).      Relevant Medications   spironolactone (ALDACTONE) 25 MG tablet   metoprolol succinate (TOPROL-XL) 25 MG 24 hr tablet   furosemide (LASIX) 40 MG tablet   Other Relevant Orders   EKG 12-Lead   ECHOCARDIOGRAM COMPLETE   Essential hypertension (Chronic)   Relevant Medications   spironolactone (ALDACTONE) 25 MG tablet   metoprolol succinate (TOPROL-XL) 25 MG 24 hr tablet   furosemide (LASIX) 40 MG tablet   Hyperlipidemia with target LDL less than 100 (Chronic)    Pretty significant coronary calcium score but minimal disease noted on coronary CTA.  I still think we should be somewhat aggressive with her cholesterol management. I did not have her labs at the time I saw her.  Her LDL is pretty high.  She is on pravastatin and I think it probably would be beneficial to consider switching to a more potent statin if tolerated.      Relevant Medications   spironolactone (ALDACTONE) 25 MG tablet   metoprolol succinate (TOPROL-XL) 25 MG 24 hr tablet   furosemide (LASIX) 40 MG tablet       I spent a total of 24 minutes with the patient and chart review. >  50% of the time was spent in direct patient consultation.   Current medicines are reviewed at length with the patient today.  (+/-  concerns) she is still little bit unsure of how to take Lasix The following changes have been made:  See below  Patient Instructions  Medication Instructions:    TAKE TOPROL ( METOPROLOL SUCCINATE) 2 HOURS BEFORE BEDTIME - START SPIRONOLACTONE 12.5 MG ( 1/2 TABLET OF 25 MG)   - LASIX ( FUROSEMIDE) 40 MG DAILY  - TAKE AN EXTRA POTASSIUM PILL IF YOU TAKE EXTRA LASIX  Sliding scale Lasix: Weigh yourself when you get home, then Daily in the Morning.   If you gain more than 3 pounds from dry weight: Increase the Lasix dosing to 80 mg in the morning  until weight returns to baseline dry weight.  If weight gain is greater than 5 pounds in 2 days: Increased to Lasix 80 mg IN  THE MORNING AND 40 MG IN THE EVENING  and contact the office for further assistance if weight does not go down the next day.  If the weight goes down more than 3 pounds from dry weight: Hold Lasix until it returns to baseline dry weight  If you need a refill on your cardiac medications before your next appointment, please call your pharmacy.   Lab work: NO LABS   Testing/Procedures: WILL BE SCHEDULE IN [place] 2020 @ [address] Your physician has requested that you have an echocardiogram. Echocardiography is a painless test that uses sound waves to create images of your heart. It provides your doctor with information about the size and shape of your heart and how well your heart's chambers and valves are working. This procedure takes approximately one hour. There are no restrictions for this procedure.   Follow-Up: At [facility], you and your health needs are our priority.  As part of our continuing mission to provide you with exceptional heart care, we have created designated Provider Care Teams.  These Care Teams include your primary Cardiologist (physician) and Advanced Practice Providers (APPs -  Physician Assistants and Nurse Practitioners) who all work together to provide you with the care you need, when you need it. . You will need a follow up appointment in  3 months [place].  Please call our office 2 months in advance to schedule this appointment.  You may see Glenetta Hew, MD or one of the following Advanced Practice Providers on your designated Care Team:   . Rosaria Ferries, PA-C . Jory Sims, DNP, ANP  Any Other Special Instructions Will Be Listed Below (If Applicable).    Studies Ordered:   Orders Placed This Encounter  Procedures  . EKG 12-Lead  . ECHOCARDIOGRAM COMPLETE      Glenetta Hew, M.D., M.S. Interventional Cardiologist   Pager # [phone] Phone # [phone] [address] [place], [place]  [address]   Thank you for choosing Heartcare at [facility]!!

## 2019-05-20 NOTE — Patient Instructions (Addendum)
Medication Instructions:    TAKE TOPROL ( METOPROLOL SUCCINATE) 2 HOURS BEFORE BEDTIME - START SPIRONOLACTONE 12.5 MG ( 1/2 TABLET OF 25 MG)   - LASIX ( FUROSEMIDE) 40 MG DAILY  - TAKE AN EXTRA POTASSIUM PILL IF YOU TAKE EXTRA LASIX  Sliding scale Lasix: Weigh yourself when you get home, then Daily in the Morning.   If you gain more than 3 pounds from dry weight: Increase the Lasix dosing to 80 mg in the morning  until weight returns to baseline dry weight.  If weight gain is greater than 5 pounds in 2 days: Increased to Lasix 80 mg IN THE MORNING AND 40 MG IN THE EVENING  and contact the office for further assistance if weight does not go down the next day.  If the weight goes down more than 3 pounds from dry weight: Hold Lasix until it returns to baseline dry weight  If you need a refill on your cardiac medications before your next appointment, please call your pharmacy.   Lab work: NO LABS   Testing/Procedures: WILL BE SCHEDULE IN [place] 2020 @ [place] [address] Your physician has requested that you have an echocardiogram. Echocardiography is a painless test that uses sound waves to create images of your heart. It provides your doctor with information about the size and shape of your heart and how well your heart's chambers and valves are working. This procedure takes approximately one hour. There are no restrictions for this procedure.   Follow-Up: At [facility], you and your health needs are our priority.  As part of our continuing mission to provide you with exceptional heart care, we have created designated Provider Care Teams.  These Care Teams include your primary Cardiologist (physician) and Advanced Practice Providers (APPs -  Physician Assistants and Nurse Practitioners) who all work together to provide you with the care you need, when you need it. . You will need a follow up appointment in  3 months [place].  Please call our office 2 months  in advance to schedule this appointment.  You may see Glenetta Hew, MD or one of the following Advanced Practice Providers on your designated Care Team:   . Rosaria Ferries, PA-C . Jory Sims, DNP, ANP  Any Other Special Instructions Will Be Listed Below (If Applicable).

## 2019-05-22 ENCOUNTER — Encounter: Payer: Self-pay | Admitting: Cardiology

## 2019-05-22 NOTE — Assessment & Plan Note (Signed)
Until this hospitalization, she had been relatively asymptomatic from A. fib standpoint.  She is now maintaining rate controlled A. fib.  She has ha nonspecific IVCD on her EKG but not a full LBBB.  I suspect this is probably related to her cardiomyopathy.  Plan: Continue current dose of Toprol for rate control along with full dose Eliquis for anticoagulation.  Low threshold to consider EP referral for potential antiarrhythmic agent versus consideration of A. fib ablation.  Also, if EF remains reduced, would need to consider ICD (possible CRT-D)

## 2019-05-22 NOTE — Assessment & Plan Note (Signed)
Significant exacerbation of her previously reduced EF based on most recent evaluation with both echo and CMRI. She seems to be relatively euvolemic now so I think we can continue with her current dose of Lasix to 40 mg daily.   Plan: will add spironolactone 12.5 mg daily.    Next step would be to consider adding low-dose ARB if not Entresto.   Continue Toprol as it is also being used for A. fib rate control.  Discussed sliding scale Lasix for weight, edema and dyspnea.  Echo ordered midway through December, will follow after that.  May need to consider referral for A. fib ablation or switching to an antiarrhythmic

## 2019-05-22 NOTE — Assessment & Plan Note (Signed)
Pretty significant coronary calcium score but minimal disease noted on coronary CTA.  I still think we should be somewhat aggressive with her cholesterol management. I did not have her labs at the time I saw her.  Her LDL is pretty high.  She is on pravastatin and I think it probably would be beneficial to consider switching to a more potent statin if tolerated.

## 2019-05-22 NOTE — Assessment & Plan Note (Signed)
The fact that she had nonocclusive disease noted on CT scan back in January would suggest that is probably nonischemic cardiomyopathy, however, she did not have a heart catheterization at the time of her CHF hospitalization mostly because of worsening renal function.  There was an unusual basal septal enhancement effect in a striped pattern that tends to tend portend poor prognosis for non-ischemic CM.  Low threshold to refer for ICD if no improvement noted on f/u Echo.  Will gradually try to add on ARB/Entresto (but BP simply not adequate at this point).

## 2019-05-24 ENCOUNTER — Telehealth: Payer: Self-pay | Admitting: Cardiology

## 2019-05-24 ENCOUNTER — Other Ambulatory Visit: Payer: Self-pay | Admitting: *Deleted

## 2019-05-24 DIAGNOSIS — E538 Deficiency of other specified B group vitamins: Secondary | ICD-10-CM | POA: Diagnosis not present

## 2019-05-24 DIAGNOSIS — Z85828 Personal history of other malignant neoplasm of skin: Secondary | ICD-10-CM | POA: Diagnosis not present

## 2019-05-24 DIAGNOSIS — Z87440 Personal history of urinary (tract) infections: Secondary | ICD-10-CM | POA: Diagnosis not present

## 2019-05-24 DIAGNOSIS — I2510 Atherosclerotic heart disease of native coronary artery without angina pectoris: Secondary | ICD-10-CM | POA: Diagnosis not present

## 2019-05-24 DIAGNOSIS — I4821 Permanent atrial fibrillation: Secondary | ICD-10-CM | POA: Diagnosis not present

## 2019-05-24 DIAGNOSIS — J449 Chronic obstructive pulmonary disease, unspecified: Secondary | ICD-10-CM | POA: Diagnosis not present

## 2019-05-24 DIAGNOSIS — R1310 Dysphagia, unspecified: Secondary | ICD-10-CM | POA: Diagnosis not present

## 2019-05-24 DIAGNOSIS — E559 Vitamin D deficiency, unspecified: Secondary | ICD-10-CM | POA: Diagnosis not present

## 2019-05-24 DIAGNOSIS — E785 Hyperlipidemia, unspecified: Secondary | ICD-10-CM | POA: Diagnosis not present

## 2019-05-24 DIAGNOSIS — D509 Iron deficiency anemia, unspecified: Secondary | ICD-10-CM | POA: Diagnosis not present

## 2019-05-24 DIAGNOSIS — M1710 Unilateral primary osteoarthritis, unspecified knee: Secondary | ICD-10-CM | POA: Diagnosis not present

## 2019-05-24 DIAGNOSIS — G510 Bell's palsy: Secondary | ICD-10-CM | POA: Diagnosis not present

## 2019-05-24 DIAGNOSIS — K219 Gastro-esophageal reflux disease without esophagitis: Secondary | ICD-10-CM | POA: Diagnosis not present

## 2019-05-24 DIAGNOSIS — Z7901 Long term (current) use of anticoagulants: Secondary | ICD-10-CM | POA: Diagnosis not present

## 2019-05-24 DIAGNOSIS — Z853 Personal history of malignant neoplasm of breast: Secondary | ICD-10-CM | POA: Diagnosis not present

## 2019-05-24 DIAGNOSIS — I5023 Acute on chronic systolic (congestive) heart failure: Secondary | ICD-10-CM | POA: Diagnosis not present

## 2019-05-24 DIAGNOSIS — N183 Chronic kidney disease, stage 3 (moderate): Secondary | ICD-10-CM | POA: Diagnosis not present

## 2019-05-24 DIAGNOSIS — Z96651 Presence of right artificial knee joint: Secondary | ICD-10-CM | POA: Diagnosis not present

## 2019-05-24 DIAGNOSIS — G4700 Insomnia, unspecified: Secondary | ICD-10-CM | POA: Diagnosis not present

## 2019-05-24 DIAGNOSIS — K5732 Diverticulitis of large intestine without perforation or abscess without bleeding: Secondary | ICD-10-CM | POA: Diagnosis not present

## 2019-05-24 DIAGNOSIS — I890 Lymphedema, not elsewhere classified: Secondary | ICD-10-CM | POA: Diagnosis not present

## 2019-05-24 DIAGNOSIS — I130 Hypertensive heart and chronic kidney disease with heart failure and stage 1 through stage 4 chronic kidney disease, or unspecified chronic kidney disease: Secondary | ICD-10-CM | POA: Diagnosis not present

## 2019-05-24 NOTE — Telephone Encounter (Signed)
New Message    Pt c/o medication issue:  1. Name of Medication: Spironolactone 25mg   2. How are you currently taking this medication (dosage and times per day)? 0.5 tablets by mouth daily   3. Are you having a reaction (difficulty breathing--STAT)? No  4. What is your medication issue? Patient states it been making her feel tired and fatigued.  Please call back to discuss.

## 2019-05-24 NOTE — Patient Outreach (Signed)
[facility] [facility]) Care Management  05/24/2019  TIAHNA CURE 11-10-1932 [id number]   Call placed to member to follow up on heart failure management. She report she is doing well, denies any shortness of breath or chest discomfort.  State she has not had any recent palpitations but have not been able to sleep in the past 2 days.  Report taking sleep aid provided by PCP but still didn't work.  She placed call to MD office, waiting call back for advice.  She has been weighing herself but unable to verbalize what her "dry weight" is according to AVS instructions.  Report weight the day after her MD visit was 170 pounds, weight has been decreasing since.  Was 180 pounds at cardiology appointment last week.  Weight yesterday was 166, today was 167.  Advised that office scales and home scales end to differ and to use daily home weights when adjusting medications.  She verbalizes understanding.  Report not seeing home health nurse in a week, but state she did have a voice message from her.  Will return call to inquire about further visits.    Denies any urgent concerns at this time, will follow up within the next month.  THN CM Care Plan Problem One     Most Recent Value  Care Plan Problem One  Knowledge deficit related to heart failure management as evidenced by 2 hospital admissions over the last 2 weeks  Role Documenting the Problem One  Care Management [place] for Problem One  Not Active  [facility] Long Term Goal   Member will not be readmitted tohospital within the next 31 days  THN Long Term Goal Start Date  04/22/19  [facility] Long Term Goal Met Date  05/24/19  [facility] CM Short Term Goal #1   Member will report taking and recording weights daily over the next 4 weeks  THN CM Short Term Goal #1 Start Date  04/22/19  [facility] CM Short Term Goal #1 Met Date  05/24/19  [facility] CM Short Term Goal #2   Member will keep and attend follow up appointments with primary MD and cardiologist within the  next week  [facility] CM Short Term Goal #2 Start Date  04/22/19  [facility] CM Short Term Goal #2 Met Date  05/03/19    [facility] CM Care Plan Problem Two     Most Recent Value  Care Plan Problem Two  Knowledge deficit regarding heart failure management as evidenced by member stated concern  Role Documenting the Problem Two  Care Management [place] for Problem Two  Active  Interventions for Problem Two Long Term Goal   Reviewed heart failure zones with member.  Confirmed she has copy of zones in the home  [facility] Long Term Goal  Member will verbalize understanding of yellow zone of heart failure action plan over the next 45 days  THN Long Term Goal Start Date  05/24/19  [facility] CM Short Term Goal #1   Member will report daily weights and taking medications accordingly as recommended per last MD visit over the next 4 weeks  THN CM Short Term Goal #1 Start Date  05/24/19  Interventions for Short Term Goal #2   Reviewed member's AVS from cardiology appointment last week. Explained when to increase Lasix and when to contact MD  [facility] CM Short Term Goal #2   Member will report increase in sleep pattern over the next 4 weeks  THN CM Short Term Goal #2  Start Date  05/24/19  Interventions for Short Term Goal #2  Advised to contact PCP regarding sleep aid not working.  Educated on importance of healthy sleep patterns in effort to effectively manage health conditions     Valente David, [place], MSN [place] [phone]

## 2019-05-24 NOTE — Telephone Encounter (Signed)
Spoke to patient ,she states she has not been able to sleep  For last 2 nights she thiks it maybe the new medication added. She states tried  Ambien as prescribed a whole tablet.  the new medication added by Dr Ellyn Hack  spironolactone 12.5 mg in the morning and metoprolol succinate 25 mg -2 hours before  Bedtime.  Informed patient to continue medication  Will defer to Dr Ellyn Hack and call her back.

## 2019-05-24 NOTE — Telephone Encounter (Signed)
Let us try doing this lets have her hold the spironolactone for a week and see if that makes a difference.  If not, then go back to the original dose of metoprolol in the evening and back on spironolactone.  If that does not make a difference then is not related to either 1 of the 2 medicines.  Glenetta Hew

## 2019-05-25 ENCOUNTER — Telehealth: Payer: Self-pay | Admitting: Cardiology

## 2019-05-25 NOTE — Telephone Encounter (Signed)
New message   Per Mechele Claude states that patient  Has been mistakenly taking extra lasix everyday and her wight is down 5 lbs and complaining of fatigue. Please call to discuss.

## 2019-05-25 NOTE — Telephone Encounter (Signed)
OK -- sounds good.  Glenetta Hew, MD

## 2019-05-25 NOTE — Telephone Encounter (Signed)
Per Mechele Claude with [facility], patient had been taking extra dose of lasix every day, instead of based on the sliding scale as suggested at 05/20/2019 visit. Mechele Claude thinks this could be contributing to her fatigue and has educated her on correct dosing. She reports her weight is down about 5lbs.   Should she still make the med change as suggested below?

## 2019-05-25 NOTE — Telephone Encounter (Signed)
Spoke with Mechele Claude about patient's medication. Patient has been taking extra lasix every day, instead of based on sliding scale. She has educated patient on how to take medication appropriately. MD made aware in another telephone encounter. Will await response

## 2019-05-31 ENCOUNTER — Telehealth: Payer: Self-pay | Admitting: Internal Medicine

## 2019-05-31 NOTE — Telephone Encounter (Signed)
[name] [name] at [facility] calling. Pt called them not feeling well last week, at visit denies any fever cold, chills, sore throat..but a headache, the patient did have a change in meds last week with lasiks and potassium.. She said she has stopped the lasiks a couple times and then feels better, A nurse is going out tomorrow and they wanted to let her dr know if he may want to have them draw some labs on the visit. Pls call [name] [name] back with the order to draw lab work by this afternoon at [phone]

## 2019-06-01 ENCOUNTER — Telehealth: Payer: Self-pay | Admitting: Cardiology

## 2019-06-01 DIAGNOSIS — G510 Bell's palsy: Secondary | ICD-10-CM | POA: Diagnosis not present

## 2019-06-01 DIAGNOSIS — Z853 Personal history of malignant neoplasm of breast: Secondary | ICD-10-CM | POA: Diagnosis not present

## 2019-06-01 DIAGNOSIS — R1310 Dysphagia, unspecified: Secondary | ICD-10-CM | POA: Diagnosis not present

## 2019-06-01 DIAGNOSIS — M1710 Unilateral primary osteoarthritis, unspecified knee: Secondary | ICD-10-CM | POA: Diagnosis not present

## 2019-06-01 DIAGNOSIS — J449 Chronic obstructive pulmonary disease, unspecified: Secondary | ICD-10-CM | POA: Diagnosis not present

## 2019-06-01 DIAGNOSIS — E538 Deficiency of other specified B group vitamins: Secondary | ICD-10-CM | POA: Diagnosis not present

## 2019-06-01 DIAGNOSIS — K5732 Diverticulitis of large intestine without perforation or abscess without bleeding: Secondary | ICD-10-CM | POA: Diagnosis not present

## 2019-06-01 DIAGNOSIS — Z87440 Personal history of urinary (tract) infections: Secondary | ICD-10-CM | POA: Diagnosis not present

## 2019-06-01 DIAGNOSIS — Z7901 Long term (current) use of anticoagulants: Secondary | ICD-10-CM | POA: Diagnosis not present

## 2019-06-01 DIAGNOSIS — I2510 Atherosclerotic heart disease of native coronary artery without angina pectoris: Secondary | ICD-10-CM | POA: Diagnosis not present

## 2019-06-01 DIAGNOSIS — G4700 Insomnia, unspecified: Secondary | ICD-10-CM | POA: Diagnosis not present

## 2019-06-01 DIAGNOSIS — Z85828 Personal history of other malignant neoplasm of skin: Secondary | ICD-10-CM | POA: Diagnosis not present

## 2019-06-01 DIAGNOSIS — Z96651 Presence of right artificial knee joint: Secondary | ICD-10-CM | POA: Diagnosis not present

## 2019-06-01 DIAGNOSIS — I5023 Acute on chronic systolic (congestive) heart failure: Secondary | ICD-10-CM | POA: Diagnosis not present

## 2019-06-01 DIAGNOSIS — K219 Gastro-esophageal reflux disease without esophagitis: Secondary | ICD-10-CM | POA: Diagnosis not present

## 2019-06-01 DIAGNOSIS — E559 Vitamin D deficiency, unspecified: Secondary | ICD-10-CM | POA: Diagnosis not present

## 2019-06-01 DIAGNOSIS — E785 Hyperlipidemia, unspecified: Secondary | ICD-10-CM | POA: Diagnosis not present

## 2019-06-01 DIAGNOSIS — N183 Chronic kidney disease, stage 3 (moderate): Secondary | ICD-10-CM | POA: Diagnosis not present

## 2019-06-01 DIAGNOSIS — I130 Hypertensive heart and chronic kidney disease with heart failure and stage 1 through stage 4 chronic kidney disease, or unspecified chronic kidney disease: Secondary | ICD-10-CM | POA: Diagnosis not present

## 2019-06-01 DIAGNOSIS — I890 Lymphedema, not elsewhere classified: Secondary | ICD-10-CM | POA: Diagnosis not present

## 2019-06-01 DIAGNOSIS — I4821 Permanent atrial fibrillation: Secondary | ICD-10-CM | POA: Diagnosis not present

## 2019-06-01 DIAGNOSIS — D509 Iron deficiency anemia, unspecified: Secondary | ICD-10-CM | POA: Diagnosis not present

## 2019-06-01 NOTE — Telephone Encounter (Signed)
[facility] notified.

## 2019-06-01 NOTE — Telephone Encounter (Signed)
If she is feeling much better now.  I think a good stable dry weight for her is about 165 267 pounds.  Use that as a dry weight from which she will take additional dose of Lasix if she is increasing weight.  Need to confirm if she is or is not taking spironolactone.  dg

## 2019-06-01 NOTE — Telephone Encounter (Signed)
They can draw a basic metabolic panel.  Thanks

## 2019-06-01 NOTE — Telephone Encounter (Signed)
Spoke with patient and Saturday she was feeling weak, lightheaded, shortness of breath and "gave out easily". She did take an extra Lasix on Sunday night weight down from 172.2 pounds to 165.6 pounds Monday. Today weight 164.8 lbs. Per patient she is feeling much better today than Saturday. Blood pressure 111/62 HR 81. Advised patient to continue same dose of medications, check her blood pressure if feels like she did Saturday, and call back if any further issues. Will forward to Dr Ellyn Hack for review

## 2019-06-01 NOTE — Telephone Encounter (Signed)
°  JoAnn from [facility] is with the patient now. She reports that the patient has some  fatigue and weakness. She wanted to get Dr. Allison Quarry nurse to discuss the patient's medications with her to make sure the patient is not taking too much medication. The nurse does not have a working phone right now, so she has to use the phones at the patient's house

## 2019-06-01 NOTE — Telephone Encounter (Signed)
  Joann from [facility] is calling back to give office number for call back. She can be reached at [phone]

## 2019-06-03 NOTE — Telephone Encounter (Signed)
Advised patient and confirmed taking Spironolactone 1/2 tablet daily

## 2019-06-06 ENCOUNTER — Telehealth: Payer: Self-pay | Admitting: Cardiology

## 2019-06-06 ENCOUNTER — Other Ambulatory Visit: Payer: Self-pay | Admitting: *Deleted

## 2019-06-06 NOTE — Telephone Encounter (Signed)
° ° °  Pt c/o medication issue:  1. Name of Medication: metoprolol succinate (TOPROL-XL) 25 MG 24 hr tablet  2. How are you currently taking this medication (dosage and times per day)? n/a  3. Are you having a reaction (difficulty breathing--STAT)? no  4. What is your medication issue? Monica from [facility] calling to verify if patient should still be taking metoprolol succinate (TOPROL-XL) 25 MG 24 hr tablet,\ Patient states she was told by home health to stop taking

## 2019-06-06 NOTE — Telephone Encounter (Signed)
The intent is for her to be on Toprol nightly 25 mg.  The metoprolol tartrate is PRN breakthrough rapid heart rates.  Glenetta Hew, MD

## 2019-06-06 NOTE — Patient Outreach (Signed)
[place] [facility]) Care Management  06/06/2019  [name] [name] 1932/12/09 [id number]   Call placed to member to follow up on heart failure management.  She report over the past couple weeks she has been having episodes of weakness and lightheadedness.  Per chart and according to home health nurse, member was taking an extra dose of Lasix daily, not just with weight increase.  Member was re-educated on what her dry weight should be per notes (165-167 pounds) and when to titrate Lasix.  Report weight today is 168 pounds (aware that no extra Lasix needed), blood pressure 122/70 and heart rate 77.  She inquires about need for Toprol.  State she was told by home health nurse last week to stop taking, report not taking for almost a week.  She is aware that this care manager will clarify with cardiology and inform her of MD response.  She inquires about Entresto as she has a bottle in the home, advised per cardiology last note, she should not be taking.  She verbalizes understanding.  Has follow up appointment with PCP next month, denies any urgent concerns.  Will follow up within the next 2 weeks.  THN CM Care Plan Problem Two     Most Recent Value  Care Plan Problem Two  Knowledge deficit regarding heart failure management as evidenced by member stated concern  Role Documenting the Problem Two  Care Management [place] for Problem Two  Active  Interventions for Problem Two Long Term Goal   Re-educated on heart failure zones and when to contact MD with changes.  THN Long Term Goal  Member will verbalize understanding of yellow zone of heart failure action plan over the next 45 days  THN Long Term Goal Start Date  05/24/19  [facility] CM Short Term Goal #1   Member will report daily weights and taking medications accordingly as recommended per last MD visit over the next 4 weeks  THN CM Short Term Goal #1 Start Date  05/24/19  Interventions for Short Term Goal #2   Discussed with  member target weight identified by MD.  Reviewed Lasix parameters and adjustments related to target weight.  THN CM Short Term Goal #2   Member will report increase in sleep pattern over the next 4 weeks  THN CM Short Term Goal #2 Start Date  05/24/19  Interventions for Short Term Goal #2  Reminded member to call PCP and discuss decreased hours of sleep as follow up is not until next month  THN CM Short Term Goal #3   Member will report taking medication as prescribed (paticularly Toprol) over the next 4 weeks  THN CM Short Term Goal #3 Start Date  06/06/19  Interventions for Short Term Goal #3  Call placed to cardiology office to request clarification of Toprol and inquire about parameters.      [name] [name], [place], MSN [place] [phone]

## 2019-06-06 NOTE — Telephone Encounter (Signed)
Tried to call number listed for [facility] but number does not go thru.  Per 9-22 2020 Leonie Man, MD  to Raiford Simmonds, RN 9:36 PM  Note: Let us try doing this lets have her hold the spironolactone for a week and see if that makes a difference.  If not, then go back to the original dose of metoprolol in the evening and back on spironolactone.  If that does not make a difference then is not related to either 1 of the 2 medicines.  Returned call to pt she states that she does not know if she is supposed to be taking metoprolol succ or not. She states that she has not had to take it since 9-22 when she stopped until last night. About 1-2am she was awoken by her heart skipping beats. She states that she took the tartrate after about an hour she was "back to normal and went back to sleep. She states that she knows that she can take the tartrate when she "needs it" but Brayton Layman was just checking to make sure she should be taking the succinate daily. She is asking if she needs to re-start taking the succinate daily. Brayton Layman thought that she should. She states that she IS taking her spironolactone daily. Please advise on metoprolol Succinate use.

## 2019-06-07 DIAGNOSIS — G4700 Insomnia, unspecified: Secondary | ICD-10-CM | POA: Diagnosis not present

## 2019-06-07 DIAGNOSIS — Z96651 Presence of right artificial knee joint: Secondary | ICD-10-CM | POA: Diagnosis not present

## 2019-06-07 DIAGNOSIS — I4821 Permanent atrial fibrillation: Secondary | ICD-10-CM | POA: Diagnosis not present

## 2019-06-07 DIAGNOSIS — J449 Chronic obstructive pulmonary disease, unspecified: Secondary | ICD-10-CM | POA: Diagnosis not present

## 2019-06-07 DIAGNOSIS — M1710 Unilateral primary osteoarthritis, unspecified knee: Secondary | ICD-10-CM | POA: Diagnosis not present

## 2019-06-07 DIAGNOSIS — E538 Deficiency of other specified B group vitamins: Secondary | ICD-10-CM | POA: Diagnosis not present

## 2019-06-07 DIAGNOSIS — I2510 Atherosclerotic heart disease of native coronary artery without angina pectoris: Secondary | ICD-10-CM | POA: Diagnosis not present

## 2019-06-07 DIAGNOSIS — E559 Vitamin D deficiency, unspecified: Secondary | ICD-10-CM | POA: Diagnosis not present

## 2019-06-07 DIAGNOSIS — Z853 Personal history of malignant neoplasm of breast: Secondary | ICD-10-CM | POA: Diagnosis not present

## 2019-06-07 DIAGNOSIS — K5732 Diverticulitis of large intestine without perforation or abscess without bleeding: Secondary | ICD-10-CM | POA: Diagnosis not present

## 2019-06-07 DIAGNOSIS — D509 Iron deficiency anemia, unspecified: Secondary | ICD-10-CM | POA: Diagnosis not present

## 2019-06-07 DIAGNOSIS — Z87440 Personal history of urinary (tract) infections: Secondary | ICD-10-CM | POA: Diagnosis not present

## 2019-06-07 DIAGNOSIS — E785 Hyperlipidemia, unspecified: Secondary | ICD-10-CM | POA: Diagnosis not present

## 2019-06-07 DIAGNOSIS — I130 Hypertensive heart and chronic kidney disease with heart failure and stage 1 through stage 4 chronic kidney disease, or unspecified chronic kidney disease: Secondary | ICD-10-CM | POA: Diagnosis not present

## 2019-06-07 DIAGNOSIS — R7989 Other specified abnormal findings of blood chemistry: Secondary | ICD-10-CM | POA: Diagnosis not present

## 2019-06-07 DIAGNOSIS — I5023 Acute on chronic systolic (congestive) heart failure: Secondary | ICD-10-CM | POA: Diagnosis not present

## 2019-06-07 DIAGNOSIS — Z85828 Personal history of other malignant neoplasm of skin: Secondary | ICD-10-CM | POA: Diagnosis not present

## 2019-06-07 DIAGNOSIS — Z7901 Long term (current) use of anticoagulants: Secondary | ICD-10-CM | POA: Diagnosis not present

## 2019-06-07 DIAGNOSIS — N183 Chronic kidney disease, stage 3 unspecified: Secondary | ICD-10-CM | POA: Diagnosis not present

## 2019-06-07 DIAGNOSIS — G510 Bell's palsy: Secondary | ICD-10-CM | POA: Diagnosis not present

## 2019-06-07 DIAGNOSIS — I890 Lymphedema, not elsewhere classified: Secondary | ICD-10-CM | POA: Diagnosis not present

## 2019-06-07 DIAGNOSIS — K219 Gastro-esophageal reflux disease without esophagitis: Secondary | ICD-10-CM | POA: Diagnosis not present

## 2019-06-07 DIAGNOSIS — R1310 Dysphagia, unspecified: Secondary | ICD-10-CM | POA: Diagnosis not present

## 2019-06-07 NOTE — Telephone Encounter (Signed)
Pt informed of providers result & recommendations. Pt verbalized understanding. No further questions . ? ?

## 2019-06-09 ENCOUNTER — Other Ambulatory Visit: Payer: Self-pay | Admitting: Cardiology

## 2019-06-09 NOTE — Telephone Encounter (Signed)
Rx request sent to pharmacy.  

## 2019-06-14 ENCOUNTER — Telehealth: Payer: Self-pay | Admitting: Internal Medicine

## 2019-06-14 DIAGNOSIS — I890 Lymphedema, not elsewhere classified: Secondary | ICD-10-CM | POA: Diagnosis not present

## 2019-06-14 DIAGNOSIS — E538 Deficiency of other specified B group vitamins: Secondary | ICD-10-CM | POA: Diagnosis not present

## 2019-06-14 DIAGNOSIS — Z85828 Personal history of other malignant neoplasm of skin: Secondary | ICD-10-CM | POA: Diagnosis not present

## 2019-06-14 DIAGNOSIS — M1710 Unilateral primary osteoarthritis, unspecified knee: Secondary | ICD-10-CM | POA: Diagnosis not present

## 2019-06-14 DIAGNOSIS — G4700 Insomnia, unspecified: Secondary | ICD-10-CM | POA: Diagnosis not present

## 2019-06-14 DIAGNOSIS — I5023 Acute on chronic systolic (congestive) heart failure: Secondary | ICD-10-CM | POA: Diagnosis not present

## 2019-06-14 DIAGNOSIS — I130 Hypertensive heart and chronic kidney disease with heart failure and stage 1 through stage 4 chronic kidney disease, or unspecified chronic kidney disease: Secondary | ICD-10-CM | POA: Diagnosis not present

## 2019-06-14 DIAGNOSIS — I4821 Permanent atrial fibrillation: Secondary | ICD-10-CM | POA: Diagnosis not present

## 2019-06-14 DIAGNOSIS — Z853 Personal history of malignant neoplasm of breast: Secondary | ICD-10-CM | POA: Diagnosis not present

## 2019-06-14 DIAGNOSIS — J449 Chronic obstructive pulmonary disease, unspecified: Secondary | ICD-10-CM | POA: Diagnosis not present

## 2019-06-14 DIAGNOSIS — D509 Iron deficiency anemia, unspecified: Secondary | ICD-10-CM | POA: Diagnosis not present

## 2019-06-14 DIAGNOSIS — R1310 Dysphagia, unspecified: Secondary | ICD-10-CM | POA: Diagnosis not present

## 2019-06-14 DIAGNOSIS — K5732 Diverticulitis of large intestine without perforation or abscess without bleeding: Secondary | ICD-10-CM | POA: Diagnosis not present

## 2019-06-14 DIAGNOSIS — Z87440 Personal history of urinary (tract) infections: Secondary | ICD-10-CM | POA: Diagnosis not present

## 2019-06-14 DIAGNOSIS — Z96651 Presence of right artificial knee joint: Secondary | ICD-10-CM | POA: Diagnosis not present

## 2019-06-14 DIAGNOSIS — N183 Chronic kidney disease, stage 3 unspecified: Secondary | ICD-10-CM | POA: Diagnosis not present

## 2019-06-14 DIAGNOSIS — I2510 Atherosclerotic heart disease of native coronary artery without angina pectoris: Secondary | ICD-10-CM | POA: Diagnosis not present

## 2019-06-14 DIAGNOSIS — Z7901 Long term (current) use of anticoagulants: Secondary | ICD-10-CM | POA: Diagnosis not present

## 2019-06-14 DIAGNOSIS — K219 Gastro-esophageal reflux disease without esophagitis: Secondary | ICD-10-CM | POA: Diagnosis not present

## 2019-06-14 DIAGNOSIS — E785 Hyperlipidemia, unspecified: Secondary | ICD-10-CM | POA: Diagnosis not present

## 2019-06-14 DIAGNOSIS — E559 Vitamin D deficiency, unspecified: Secondary | ICD-10-CM | POA: Diagnosis not present

## 2019-06-14 DIAGNOSIS — G510 Bell's palsy: Secondary | ICD-10-CM | POA: Diagnosis not present

## 2019-06-14 NOTE — Telephone Encounter (Signed)
Patient had some questions about the sleeping medication that was prescribe by Dr Alain Marion .  Call back [phone]  Patient states the sleeping pills are not helping her. Patient has been using the Ambien-she reports it is not helping. She wants to know if there is something else her PCP can call in. Patient states the OTC did work- but they are not working either. Patient states she is practicing good sleep habits.

## 2019-06-14 NOTE — Telephone Encounter (Signed)
Please advise 

## 2019-06-14 NOTE — Telephone Encounter (Signed)
Patient had some questions about the sleeping medication that was prescribe by Dr Alain Marion .  Call back [phone]

## 2019-06-15 ENCOUNTER — Telehealth: Payer: Self-pay | Admitting: Cardiology

## 2019-06-15 MED ORDER — TEMAZEPAM 15 MG PO CAPS: 15.0000 mg | ORAL_CAPSULE | Freq: Every evening | ORAL | 3 refills | Status: AC | PRN

## 2019-06-15 NOTE — Telephone Encounter (Signed)
Ok Temazepam Rx emailed Thx

## 2019-06-15 NOTE — Telephone Encounter (Signed)
Follow up:     Patient calling stating that the Eliquis is to much. Patient would like for some one to call her back.

## 2019-06-15 NOTE — Telephone Encounter (Signed)
Returned call to pt $126 pt cannot afford to pay for this she must be in the donut hole. Informed pt that she will have to pay for this medication. She states that she would like to see if RN has any other recommendations/

## 2019-06-15 NOTE — Telephone Encounter (Signed)
No samples available 

## 2019-06-15 NOTE — Addendum Note (Signed)
Addended by: Cassandria Anger on: 06/15/2019 07:48 AM   Modules accepted: Orders

## 2019-06-15 NOTE — Telephone Encounter (Signed)
New message   Pt c/o medication issue:  1. Name of Medication:  ELIQUIS 5 MG TABS tablet     2. How are you currently taking this medication (dosage and times per day)? Twice daily  3. Are you having a reaction (difficulty breathing--STAT)? n/a  4. What is your medication issue? Patient states that this medication is too expensive and wants to know if there is a cheaper substiitute for this medication. Please advise.

## 2019-06-16 NOTE — Telephone Encounter (Signed)
Follow up:     Patient returning call back concering that she can not pay 126.00. please call patient she only has one pill.

## 2019-06-17 MED ORDER — APIXABAN 5 MG PO TABS: 5.0000 mg | ORAL_TABLET | Freq: Two times a day (BID) | ORAL | 0 refills | Status: AC

## 2019-06-17 NOTE — Telephone Encounter (Signed)
Spoke to patient - informed her samples are available tfor pick up as well as a patient assistance form form drug company.  patient is aware to fill information and bring back to office  will see if patient receives assistance from [facility]   patient verbalized understanding.

## 2019-06-20 ENCOUNTER — Other Ambulatory Visit: Payer: Self-pay | Admitting: *Deleted

## 2019-06-20 ENCOUNTER — Other Ambulatory Visit: Payer: Self-pay | Admitting: Pharmacist

## 2019-06-20 NOTE — Telephone Encounter (Signed)
SPOKE TO PATIENT - ASSISTED WITH PATIENT ASSISTANCE FORM . PATIENT STATES SHE WILL BRING TO THE OFFICE.

## 2019-06-20 NOTE — Patient Outreach (Signed)
[name]) Care Management  06/20/2019  ANTIONETTA ATOR 07-Apr-1933 [id number]   Call placed to member to follow up on heart failure management.  State she is doing well, denies any shortness of breath or chest discomfort at this time.  Report weight remains stable, today was 164 pounds.  State blood pressure was 110/50 and heart rate was 39.  Advised to recheck while this care manager on phone, recheck was 125/54 and 71.  Report taking Toprol as indicated but now having trouble paying for Eliquis.  She received samples from MD office, unsure how to complete application for financial assistance.  Agrees to have [facility] pharmacist follow up for help completing application.  Denies any urgent concerns at this time, will follow up within the next month.  THN CM Care Plan Problem Two     Most Recent Value  Care Plan Problem Two  Knowledge deficit regarding heart failure management as evidenced by member stated concern  Role Documenting the Problem Two  Care Management [place] for Problem Two  Active  Interventions for Problem Two Long Term Goal   Discussed with member accurate measures for heart failure management (low salt diet, daily weights, medication management, contacting MD with weight changes).  THN Long Term Goal  Member will verbalize understanding of yellow zone of heart failure action plan over the next 45 days  THN Long Term Goal Start Date  05/24/19  [facility] CM Short Term Goal #1   Member will report daily weights and taking medications accordingly as recommended per last MD visit over the next 4 weeks  THN CM Short Term Goal #1 Start Date  05/24/19  [facility] CM Short Term Goal #1 Met Date   06/20/19  THN CM Short Term Goal #2   Member will report increase in sleep pattern over the next 4 weeks  THN CM Short Term Goal #2 Start Date  05/24/19  [facility] CM Short Term Goal #2 Met Date  06/20/19  THN CM Short Term Goal #3   Member will report taking medication as  prescribed (paticularly Toprol) over the next 4 weeks  THN CM Short Term Goal #3 Start Date  06/06/19  [facility] CM Short Term Goal #3 Met Date  06/20/19  THN CM Short Term Goal #4  Member will report plan to afford Eliquis within the next 3 weeks  THN CM Short Term Goal #4 Start Date  06/20/19  Interventions of Short Term Goal #4  Collaorated with [facility] pharmacist regarding concern for medications assistance form      Valente David, Therapist, sports, MSN [place] Manager [phone]

## 2019-06-20 NOTE — Telephone Encounter (Signed)
Follow up   Per patient needs a call for assistance with paperwork per the previous note. Please call.

## 2019-06-20 NOTE — Patient Outreach (Signed)
[place] [facility]) Care Management  [place]  06/20/2019  ORA SPITTLER [birth year] [id number]   Reason for referral: Medication Assistance with Eliquis  Referral source: [facility] RN Current insurance: [facility]  Outreach:  Unsuccessful telephone call attempt #1 to patient.   HIPAA compliant voicemail left requesting a return call  Plan:  -I will mail patient an unsuccessful outreach letter.  -I will make another outreach attempt to patient within 3-4 business days.    Ralene Bathe, PharmD, [place] [phone]

## 2019-06-21 ENCOUNTER — Telehealth: Payer: Self-pay | Admitting: Cardiology

## 2019-06-21 DIAGNOSIS — E785 Hyperlipidemia, unspecified: Secondary | ICD-10-CM | POA: Diagnosis not present

## 2019-06-21 DIAGNOSIS — D509 Iron deficiency anemia, unspecified: Secondary | ICD-10-CM | POA: Diagnosis not present

## 2019-06-21 DIAGNOSIS — Z853 Personal history of malignant neoplasm of breast: Secondary | ICD-10-CM | POA: Diagnosis not present

## 2019-06-21 DIAGNOSIS — I2510 Atherosclerotic heart disease of native coronary artery without angina pectoris: Secondary | ICD-10-CM | POA: Diagnosis not present

## 2019-06-21 DIAGNOSIS — G4700 Insomnia, unspecified: Secondary | ICD-10-CM | POA: Diagnosis not present

## 2019-06-21 DIAGNOSIS — I890 Lymphedema, not elsewhere classified: Secondary | ICD-10-CM | POA: Diagnosis not present

## 2019-06-21 DIAGNOSIS — N183 Chronic kidney disease, stage 3 unspecified: Secondary | ICD-10-CM | POA: Diagnosis not present

## 2019-06-21 DIAGNOSIS — I130 Hypertensive heart and chronic kidney disease with heart failure and stage 1 through stage 4 chronic kidney disease, or unspecified chronic kidney disease: Secondary | ICD-10-CM | POA: Diagnosis not present

## 2019-06-21 DIAGNOSIS — Z7901 Long term (current) use of anticoagulants: Secondary | ICD-10-CM | POA: Diagnosis not present

## 2019-06-21 DIAGNOSIS — R1310 Dysphagia, unspecified: Secondary | ICD-10-CM | POA: Diagnosis not present

## 2019-06-21 DIAGNOSIS — K5732 Diverticulitis of large intestine without perforation or abscess without bleeding: Secondary | ICD-10-CM | POA: Diagnosis not present

## 2019-06-21 DIAGNOSIS — K219 Gastro-esophageal reflux disease without esophagitis: Secondary | ICD-10-CM | POA: Diagnosis not present

## 2019-06-21 DIAGNOSIS — M1710 Unilateral primary osteoarthritis, unspecified knee: Secondary | ICD-10-CM | POA: Diagnosis not present

## 2019-06-21 DIAGNOSIS — G510 Bell's palsy: Secondary | ICD-10-CM | POA: Diagnosis not present

## 2019-06-21 DIAGNOSIS — E538 Deficiency of other specified B group vitamins: Secondary | ICD-10-CM | POA: Diagnosis not present

## 2019-06-21 DIAGNOSIS — J449 Chronic obstructive pulmonary disease, unspecified: Secondary | ICD-10-CM | POA: Diagnosis not present

## 2019-06-21 DIAGNOSIS — I5023 Acute on chronic systolic (congestive) heart failure: Secondary | ICD-10-CM | POA: Diagnosis not present

## 2019-06-21 DIAGNOSIS — Z96651 Presence of right artificial knee joint: Secondary | ICD-10-CM | POA: Diagnosis not present

## 2019-06-21 DIAGNOSIS — E559 Vitamin D deficiency, unspecified: Secondary | ICD-10-CM | POA: Diagnosis not present

## 2019-06-21 DIAGNOSIS — Z85828 Personal history of other malignant neoplasm of skin: Secondary | ICD-10-CM | POA: Diagnosis not present

## 2019-06-21 DIAGNOSIS — Z87440 Personal history of urinary (tract) infections: Secondary | ICD-10-CM | POA: Diagnosis not present

## 2019-06-21 DIAGNOSIS — I4821 Permanent atrial fibrillation: Secondary | ICD-10-CM | POA: Diagnosis not present

## 2019-06-21 NOTE — Telephone Encounter (Signed)
Spoke with pt, she reports an episode of elevated heart rate last night and every this morning. She took the lopressor and it took care of the problem. She feels fine now and feels the elevated heart rate was because she was worrying about getting her medications. She has a follow up appointment in December and will let us know if the fast rate becomes more frequent prior to appointment time.

## 2019-06-21 NOTE — Telephone Encounter (Signed)
[name] [name] with [facility] is calling to report that the Patient had an episode of irregular heart rate yesterday evening and last night. She states that Patient took Metoprolol as directed by Dr. Ellyn Hack which she states did help. [name] [name] states episodes are happening more frequent and would like the nurse to touch base with patient.

## 2019-06-23 ENCOUNTER — Other Ambulatory Visit: Payer: Self-pay | Admitting: Pharmacist

## 2019-06-23 ENCOUNTER — Ambulatory Visit: Payer: Self-pay | Admitting: Pharmacist

## 2019-06-23 NOTE — Patient Outreach (Signed)
[place] [facility]) Care Management  [place]   06/23/2019  [name] [birth year] [id number]  Reason for referral: Medication Assistance with Eliquis  Referral source: [facility] RN Current insurance: [facility]  Outreach:  Unsuccessful telephone call attempt #2 to patient. HIPAA compliant voicemail left requesting a return call  Plan:  -I will make another outreach attempt to patient within 3-4 business days.    Ralene Bathe, PharmD, [place] [phone]

## 2019-06-27 ENCOUNTER — Other Ambulatory Visit: Payer: Self-pay | Admitting: Pharmacist

## 2019-06-27 ENCOUNTER — Ambulatory Visit: Payer: Self-pay | Admitting: Pharmacist

## 2019-06-27 ENCOUNTER — Telehealth: Payer: Self-pay | Admitting: *Deleted

## 2019-06-27 ENCOUNTER — Other Ambulatory Visit: Payer: Self-pay | Admitting: Internal Medicine

## 2019-06-27 NOTE — Patient Outreach (Signed)
[place] [facility]) Care Management  [place] 06/27/2019  [name] [name] Oct 30, 1932 [id number]  Reason for referral:Medication Assistance withEliquis  Referral source:THN RN Current insurance:[employer] [place]  Outreach: Successful call with patient today.  Patient reports she is in the coverage gap and co-pay for Eliquis is very expensive. She reports she spoke with her cardiology office (Dr. Ellyn Hack) and was able to obtain samples.  Office is also assisting patient with submitting Eliquis patient assistance program application to [employer].  She dropped off all paperwork last week and hopefully will hear back in the next week on decision.  Patient declines medication review at this time.  She has my phone number if she needs to reach me.    Plan: Will close [place], PharmD, [place] [phone]

## 2019-06-27 NOTE — Telephone Encounter (Signed)
PATIENT ASSISTANCE FORM  FAXED FOR ELIQUIS  FAXED TO  [employer]- [place]

## 2019-06-29 ENCOUNTER — Encounter: Payer: Self-pay | Admitting: Internal Medicine

## 2019-06-29 ENCOUNTER — Other Ambulatory Visit: Payer: Self-pay

## 2019-06-29 ENCOUNTER — Ambulatory Visit (INDEPENDENT_AMBULATORY_CARE_PROVIDER_SITE_OTHER): Payer: Medicare Other | Admitting: Internal Medicine

## 2019-06-29 DIAGNOSIS — M5386 Other specified dorsopathies, lumbar region: Secondary | ICD-10-CM

## 2019-06-29 DIAGNOSIS — R634 Abnormal weight loss: Secondary | ICD-10-CM

## 2019-06-29 DIAGNOSIS — N183 Chronic kidney disease, stage 3 unspecified: Secondary | ICD-10-CM | POA: Diagnosis not present

## 2019-06-29 DIAGNOSIS — R197 Diarrhea, unspecified: Secondary | ICD-10-CM

## 2019-06-29 MED ORDER — HYDROCODONE-ACETAMINOPHEN 5-325 MG PO TABS: 0.5000 | ORAL_TABLET | Freq: Four times a day (QID) | ORAL | 0 refills | Status: DC | PRN

## 2019-06-29 MED ORDER — DIPHENOXYLATE-ATROPINE 2.5-0.025 MG PO TABS: 1.0000 | ORAL_TABLET | Freq: Four times a day (QID) | ORAL | 0 refills | Status: DC | PRN

## 2019-06-29 NOTE — Assessment & Plan Note (Signed)
Wt Readings from Last 3 Encounters:  06/29/19 172 lb (78 kg)  05/20/19 182 lb (82.6 kg)  04/26/19 180 lb (81.6 kg)  Treat diarrhea

## 2019-06-29 NOTE — Progress Notes (Signed)
Subjective:  Patient ID: [name] [name], female    DOB: 09/20/1932  Age: 85 y.o. MRN: [id number]  CC: No chief complaint on file.   HPI [name] [name] presents for LBP/OA f/u C/o diarrhea since last night all night, green stools. Took PeptoBismol - not better  Outpatient Medications Prior to Visit  Medication Sig Dispense Refill  . acetaminophen (TYLENOL) 500 MG tablet Take 500 mg by mouth 2 (two) times daily as needed for moderate pain.     Marland Kitchen apixaban (ELIQUIS) 5 MG TABS tablet Take 1 tablet (5 mg total) by mouth 2 (two) times daily. 42 tablet 0  . [facility] B-12 500 MCG SUBL Place 2 tablets (1,000 mcg total) under the tongue daily. 200 tablet 3  . diphenhydrAMINE (SOMINEX) 25 MG tablet Take 25 mg by mouth at bedtime.     . furosemide (LASIX) 40 MG tablet Take 1 tablet (40 mg total) by mouth daily. MAY AN ADDITIONAL DOES IF WEIGHT IS GREATER THAN 3 LBS OR AS DIRECTED 110 tablet 3  . Multiple Vitamin (MULTIVITAMIN WITH MINERALS) TABS tablet Take 1 tablet by mouth daily.    . pantoprazole (PROTONIX) 40 MG tablet Take 1 tablet (40 mg total) by mouth 2 (two) times daily. 60 tablet 5  . potassium chloride SA (KLOR-CON M20) 20 MEQ tablet Take 1 tablet (20 mEq total) by mouth 2 (two) times daily. TAKE AN ADDITIONAL DOSE IT EXTRA FUROSEMIDE IS TAKEN 180 tablet 3  . pravastatin (PRAVACHOL) 20 MG tablet Take 1 tablet (20 mg total) by mouth daily. 90 tablet 3  . spironolactone (ALDACTONE) 25 MG tablet Take 0.5 tablets (12.5 mg total) by mouth daily. 90 tablet 3  . temazepam (RESTORIL) 15 MG capsule Take 1 capsule (15 mg total) by mouth at bedtime as needed for sleep. 30 capsule 3  . triamcinolone cream (KENALOG) 0.5 % APPLY  CREAM TOPICALLY TO AFFECTED AREA 4 TIMES DAILY ON RASH 60 g 0   No facility-administered medications prior to visit.     ROS: Review of Systems  Constitutional: Positive for fatigue. Negative for activity change, appetite change, chills and unexpected weight change.  HENT:  Negative for congestion, mouth sores and sinus pressure.   Eyes: Negative for visual disturbance.  Respiratory: Negative for cough and chest tightness.   Gastrointestinal: Positive for diarrhea. Negative for abdominal pain and nausea.  Genitourinary: Negative for difficulty urinating, frequency and vaginal pain.  Musculoskeletal: Positive for arthralgias, back pain and gait problem.  Skin: Negative for pallor and rash.  Neurological: Negative for dizziness, tremors, weakness, numbness and headaches.  Psychiatric/Behavioral: Negative for confusion, sleep disturbance and suicidal ideas.    Objective:  BP 116/72 (BP Location: Right Arm, Patient Position: Sitting, Cuff Size: Normal)   Pulse 66   Temp 98 F (36.7 C) (Oral)   Ht 5\' 6"  (1.676 m)   Wt 172 lb (78 kg)   SpO2 98%   BMI 27.76 kg/m   BP Readings from Last 3 Encounters:  06/29/19 116/72  05/20/19 114/80  04/26/19 102/70    Wt Readings from Last 3 Encounters:  06/29/19 172 lb (78 kg)  05/20/19 182 lb (82.6 kg)  04/26/19 180 lb (81.6 kg)    Physical Exam Constitutional:      General: She is not in acute distress.    Appearance: She is well-developed.  HENT:     Head: Normocephalic.     Right Ear: External ear normal.     Left Ear: External ear normal.  Nose: Nose normal.  Eyes:     General:        Right eye: No discharge.        Left eye: No discharge.     Conjunctiva/sclera: Conjunctivae normal.     Pupils: Pupils are equal, round, and reactive to light.  Neck:     Musculoskeletal: Normal range of motion and neck supple.     Thyroid: No thyromegaly.     Vascular: No JVD.     Trachea: No tracheal deviation.  Cardiovascular:     Rate and Rhythm: Normal rate and regular rhythm.     Heart sounds: Normal heart sounds.  Pulmonary:     Effort: No respiratory distress.     Breath sounds: No stridor. No wheezing.  Abdominal:     General: Bowel sounds are normal. There is no distension.     Palpations: Abdomen  is soft. There is no mass.     Tenderness: There is no abdominal tenderness. There is no guarding or rebound.  Musculoskeletal:        General: No tenderness.  Lymphadenopathy:     Cervical: No cervical adenopathy.  Skin:    Findings: No erythema or rash.  Neurological:     Mental Status: She is oriented to person, place, and time.     Cranial Nerves: No cranial nerve deficit.     Motor: No abnormal muscle tone.     Coordination: Coordination normal.     Deep Tendon Reflexes: Reflexes normal.  Psychiatric:        Behavior: Behavior normal.        Thought Content: Thought content normal.        Judgment: Judgment normal.   cane abd sensitive  Lab Results  Component Value Date   WBC 4.7 04/19/2019   HGB 12.9 04/19/2019   HCT 39.2 04/19/2019   PLT 234 04/19/2019   GLUCOSE 97 04/25/2019   CHOL 210 (H) 04/10/2019   TRIG 51 04/10/2019   HDL 62 04/10/2019   LDLCALC 138 (H) 04/10/2019   ALT 28 04/09/2019   AST 33 04/09/2019   NA 139 04/25/2019   K 3.7 04/25/2019   CL 98 04/25/2019   CREATININE 1.38 (H) 04/25/2019   BUN 25 (H) 04/25/2019   CO2 28 04/25/2019   TSH 2.691 04/09/2019   INR 1.67 (H) 05/27/2014   HGBA1C 5.6 04/10/2019    Dg Chest 2 View  Result Date: 04/18/2019 CLINICAL DATA:  Chest pain EXAM: CHEST - 2 VIEW COMPARISON:  CT from 8 days ago FINDINGS: Cardiomegaly without edema or effusion. No air bronchogram or pneumothorax. No acute osseous finding.  Postoperative left axilla. IMPRESSION: Cardiomegaly without failure. Electronically Signed   By: Monte Fantasia M.D.   On: 04/18/2019 06:34   Mr Cardiac Morphology W Wo Contrast  Result Date: 04/19/2019 CLINICAL DATA:  79F with HFrEF, evaluate possible left atrial mass seen on TTE EXAM: CARDIAC MRI TECHNIQUE: The patient was scanned on a 1.5 Tesla GE magnet. A dedicated cardiac coil was used. Functional imaging was done using Fiesta sequences. 2,3, and 4 chamber views were done to assess for RWMA's. Modified  Simpson's rule using a short axis stack was used to calculate an ejection fraction on a dedicated work Conservation officer, nature. The patient received 7 cc of Gadavist. Inversion recovery sequences were used to assess for infiltration and scar tissue. CONTRAST:  7 cc  of Gadavist FINDINGS: Left ventricle: Severe dilatation. Severe global systolic dysfunction. Basal septal midwall  stripe of late gadolinium enhancement. LV EF:  23 % (Normal 56-78%) Absolute volumes: LV EDV:          270 mL (Normal 52-141 mL) LV ESV:           208 mL (Normal 13-51 mL) LV SV:   62  mL (Normal 33-97 mL) CO:    4.1  L/min (Normal 2.7-6.0 L/min) Indexed volumes LV EDV:           137 mL/sq-m (Normal 41-81 mL/sq-m) LV ESV:            105 mL/sq-m (Normal 12-21 mL/sq-m) LV SV:  31 mL/sq-m (Normal 26-56 mL/sq-m) CI: 2.0  L/min/sq-m (Normal 1.8-3.8 L/min/sq-m) Right ventricle: Mild dilatation.  Moderate systolic dysfunction. RV EF: 33% (Normal 47-80%) Absolute volumes: RV EDV:           184 mL (Normal 58-154 mL) RV ESV:           123 mL (Normal 12-68 mL) RV SV:    61 mL (Normal 35-98 mL) CO: 4.1  L/min (Normal 2.7-6 L/min) Indexed volumes RV EDV:           93 mL/sq-m (Normal 48-87 mL/sq-m) RV ESV:           62 mL/sq-m (Normal 11-28 mL/sq-m) RV SV:  31 mL/sq-m (Normal 27-57 mL/sq-m) CI: 2.0  L/min/sq-m (Normal 1.8-3.8 L/min/sq-m) Left atrium: Severe enlargement.  No mass visualized Right atrium: Moderate enlargement Mitral valve: Mild to moderate regurgitation Aortic valve: No regurgitation Tricuspid valve: Mild to moderate regurgitation Pulmonic valve: Not visualized Aorta: Normal proximal ascending aorta Pericardium: No effusion IMPRESSION: 1. No left atrial mass visualized 2. Severe LV dilatation with severe global systolic dysfunction (EF 123XX123) 3. Mild RV dilatation with moderate systolic dysfunction (EF [id number]) 4. Basal LV septal midwall stripe of late gadolinium enhancement. This scar pattern is seen in nonischemic cardiomyopathies and  portends a worse prognosis Oswaldo Milian, MD Electronically Signed   By: Oswaldo Milian MD   On: 04/19/2019 20:54    Assessment & Plan:   There are no diagnoses linked to this encounter.   No orders of the defined types were placed in this encounter.    Follow-up: No follow-ups on file.  Walker Kehr, MD

## 2019-06-29 NOTE — Assessment & Plan Note (Signed)
Monitor BMET Discussed

## 2019-06-29 NOTE — Assessment & Plan Note (Signed)
acute <24 hrs Lomotil prn

## 2019-06-29 NOTE — Patient Instructions (Signed)

## 2019-06-29 NOTE — Assessment & Plan Note (Signed)
MSK Tramadol prn 

## 2019-07-04 ENCOUNTER — Encounter: Payer: Self-pay | Admitting: *Deleted

## 2019-07-04 NOTE — Telephone Encounter (Signed)
This encounter was created in error - please disregard.

## 2019-07-05 ENCOUNTER — Emergency Department (HOSPITAL_COMMUNITY)
Admission: EM | Admit: 2019-07-05 | Discharge: 2019-07-05 | Discharge status: Against Medical Advice | Payer: Medicare Other | Source: Home / Self Care

## 2019-07-05 ENCOUNTER — Encounter (HOSPITAL_COMMUNITY): Payer: Self-pay | Admitting: Emergency Medicine

## 2019-07-05 ENCOUNTER — Other Ambulatory Visit: Payer: Self-pay

## 2019-07-05 ENCOUNTER — Ambulatory Visit: Payer: Self-pay

## 2019-07-05 ENCOUNTER — Encounter (HOSPITAL_COMMUNITY): Payer: Self-pay

## 2019-07-05 ENCOUNTER — Inpatient Hospital Stay (HOSPITAL_BASED_OUTPATIENT_CLINIC_OR_DEPARTMENT_OTHER)
Admission: EM | Admit: 2019-07-05 | Discharge: 2019-07-15 | DRG: 388 | Disposition: A | Discharge status: Home Health | Payer: Medicare Other | Attending: Internal Medicine | Admitting: Internal Medicine

## 2019-07-05 ENCOUNTER — Other Ambulatory Visit: Payer: Self-pay | Admitting: Internal Medicine

## 2019-07-05 ENCOUNTER — Ambulatory Visit (INDEPENDENT_AMBULATORY_CARE_PROVIDER_SITE_OTHER)
Admission: EM | Admit: 2019-07-05 | Discharge: 2019-07-05 | Disposition: A | Discharge status: Other Acute Inpatient Hospital | Payer: Medicare Other | Source: Home / Self Care

## 2019-07-05 ENCOUNTER — Emergency Department (HOSPITAL_BASED_OUTPATIENT_CLINIC_OR_DEPARTMENT_OTHER): Payer: Medicare Other

## 2019-07-05 ENCOUNTER — Encounter (HOSPITAL_BASED_OUTPATIENT_CLINIC_OR_DEPARTMENT_OTHER): Payer: Self-pay | Admitting: *Deleted

## 2019-07-05 DIAGNOSIS — E785 Hyperlipidemia, unspecified: Secondary | ICD-10-CM | POA: Diagnosis present

## 2019-07-05 DIAGNOSIS — R739 Hyperglycemia, unspecified: Secondary | ICD-10-CM | POA: Diagnosis present

## 2019-07-05 DIAGNOSIS — Z888 Allergy status to other drugs, medicaments and biological substances status: Secondary | ICD-10-CM

## 2019-07-05 DIAGNOSIS — E559 Vitamin D deficiency, unspecified: Secondary | ICD-10-CM | POA: Diagnosis present

## 2019-07-05 DIAGNOSIS — N189 Chronic kidney disease, unspecified: Secondary | ICD-10-CM | POA: Diagnosis not present

## 2019-07-05 DIAGNOSIS — Z8249 Family history of ischemic heart disease and other diseases of the circulatory system: Secondary | ICD-10-CM | POA: Diagnosis not present

## 2019-07-05 DIAGNOSIS — I4821 Permanent atrial fibrillation: Secondary | ICD-10-CM | POA: Diagnosis not present

## 2019-07-05 DIAGNOSIS — N179 Acute kidney failure, unspecified: Secondary | ICD-10-CM | POA: Diagnosis not present

## 2019-07-05 DIAGNOSIS — E538 Deficiency of other specified B group vitamins: Secondary | ICD-10-CM | POA: Diagnosis present

## 2019-07-05 DIAGNOSIS — K5669 Other partial intestinal obstruction: Secondary | ICD-10-CM | POA: Diagnosis not present

## 2019-07-05 DIAGNOSIS — K5730 Diverticulosis of large intestine without perforation or abscess without bleeding: Secondary | ICD-10-CM | POA: Diagnosis not present

## 2019-07-05 DIAGNOSIS — Z5321 Procedure and treatment not carried out due to patient leaving prior to being seen by health care provider: Secondary | ICD-10-CM | POA: Insufficient documentation

## 2019-07-05 DIAGNOSIS — E611 Iron deficiency: Secondary | ICD-10-CM | POA: Diagnosis present

## 2019-07-05 DIAGNOSIS — I420 Dilated cardiomyopathy: Secondary | ICD-10-CM | POA: Diagnosis not present

## 2019-07-05 DIAGNOSIS — Z79899 Other long term (current) drug therapy: Secondary | ICD-10-CM

## 2019-07-05 DIAGNOSIS — I5042 Chronic combined systolic (congestive) and diastolic (congestive) heart failure: Secondary | ICD-10-CM | POA: Diagnosis present

## 2019-07-05 DIAGNOSIS — Z87448 Personal history of other diseases of urinary system: Secondary | ICD-10-CM

## 2019-07-05 DIAGNOSIS — R112 Nausea with vomiting, unspecified: Secondary | ICD-10-CM | POA: Diagnosis present

## 2019-07-05 DIAGNOSIS — F329 Major depressive disorder, single episode, unspecified: Secondary | ICD-10-CM | POA: Diagnosis present

## 2019-07-05 DIAGNOSIS — I495 Sick sinus syndrome: Secondary | ICD-10-CM | POA: Diagnosis not present

## 2019-07-05 DIAGNOSIS — E43 Unspecified severe protein-calorie malnutrition: Secondary | ICD-10-CM | POA: Diagnosis present

## 2019-07-05 DIAGNOSIS — Z7901 Long term (current) use of anticoagulants: Secondary | ICD-10-CM

## 2019-07-05 DIAGNOSIS — Z8619 Personal history of other infectious and parasitic diseases: Secondary | ICD-10-CM

## 2019-07-05 DIAGNOSIS — I2510 Atherosclerotic heart disease of native coronary artery without angina pectoris: Secondary | ICD-10-CM | POA: Diagnosis not present

## 2019-07-05 DIAGNOSIS — K56609 Unspecified intestinal obstruction, unspecified as to partial versus complete obstruction: Secondary | ICD-10-CM | POA: Diagnosis present

## 2019-07-05 DIAGNOSIS — Z881 Allergy status to other antibiotic agents status: Secondary | ICD-10-CM

## 2019-07-05 DIAGNOSIS — Z6832 Body mass index (BMI) 32.0-32.9, adult: Secondary | ICD-10-CM

## 2019-07-05 DIAGNOSIS — Z886 Allergy status to analgesic agent status: Secondary | ICD-10-CM

## 2019-07-05 DIAGNOSIS — Z803 Family history of malignant neoplasm of breast: Secondary | ICD-10-CM

## 2019-07-05 DIAGNOSIS — R1084 Generalized abdominal pain: Secondary | ICD-10-CM

## 2019-07-05 DIAGNOSIS — R634 Abnormal weight loss: Secondary | ICD-10-CM | POA: Diagnosis present

## 2019-07-05 DIAGNOSIS — R1110 Vomiting, unspecified: Secondary | ICD-10-CM | POA: Diagnosis not present

## 2019-07-05 DIAGNOSIS — I472 Ventricular tachycardia: Secondary | ICD-10-CM | POA: Diagnosis not present

## 2019-07-05 DIAGNOSIS — I110 Hypertensive heart disease with heart failure: Secondary | ICD-10-CM | POA: Diagnosis present

## 2019-07-05 DIAGNOSIS — K5651 Intestinal adhesions [bands], with partial obstruction: Secondary | ICD-10-CM | POA: Diagnosis not present

## 2019-07-05 DIAGNOSIS — R0602 Shortness of breath: Secondary | ICD-10-CM | POA: Diagnosis not present

## 2019-07-05 DIAGNOSIS — Z515 Encounter for palliative care: Secondary | ICD-10-CM | POA: Diagnosis not present

## 2019-07-05 DIAGNOSIS — Z431 Encounter for attention to gastrostomy: Secondary | ICD-10-CM | POA: Diagnosis not present

## 2019-07-05 DIAGNOSIS — Z0189 Encounter for other specified special examinations: Secondary | ICD-10-CM

## 2019-07-05 DIAGNOSIS — Z20828 Contact with and (suspected) exposure to other viral communicable diseases: Secondary | ICD-10-CM | POA: Diagnosis present

## 2019-07-05 DIAGNOSIS — Z452 Encounter for adjustment and management of vascular access device: Secondary | ICD-10-CM | POA: Diagnosis not present

## 2019-07-05 DIAGNOSIS — Z95828 Presence of other vascular implants and grafts: Secondary | ICD-10-CM

## 2019-07-05 DIAGNOSIS — Z4659 Encounter for fitting and adjustment of other gastrointestinal appliance and device: Secondary | ICD-10-CM

## 2019-07-05 DIAGNOSIS — K219 Gastro-esophageal reflux disease without esophagitis: Secondary | ICD-10-CM | POA: Diagnosis present

## 2019-07-05 DIAGNOSIS — R3129 Other microscopic hematuria: Secondary | ICD-10-CM | POA: Diagnosis present

## 2019-07-05 DIAGNOSIS — Z4682 Encounter for fitting and adjustment of non-vascular catheter: Secondary | ICD-10-CM | POA: Diagnosis not present

## 2019-07-05 DIAGNOSIS — I10 Essential (primary) hypertension: Secondary | ICD-10-CM | POA: Diagnosis not present

## 2019-07-05 DIAGNOSIS — E46 Unspecified protein-calorie malnutrition: Secondary | ICD-10-CM | POA: Insufficient documentation

## 2019-07-05 DIAGNOSIS — Z09 Encounter for follow-up examination after completed treatment for conditions other than malignant neoplasm: Secondary | ICD-10-CM

## 2019-07-05 DIAGNOSIS — E876 Hypokalemia: Secondary | ICD-10-CM

## 2019-07-05 DIAGNOSIS — Z01810 Encounter for preprocedural cardiovascular examination: Secondary | ICD-10-CM | POA: Diagnosis not present

## 2019-07-05 DIAGNOSIS — Z96651 Presence of right artificial knee joint: Secondary | ICD-10-CM | POA: Diagnosis present

## 2019-07-05 DIAGNOSIS — R109 Unspecified abdominal pain: Secondary | ICD-10-CM | POA: Insufficient documentation

## 2019-07-05 DIAGNOSIS — Z90710 Acquired absence of both cervix and uterus: Secondary | ICD-10-CM

## 2019-07-05 DIAGNOSIS — Z853 Personal history of malignant neoplasm of breast: Secondary | ICD-10-CM

## 2019-07-05 DIAGNOSIS — Z85828 Personal history of other malignant neoplasm of skin: Secondary | ICD-10-CM

## 2019-07-05 DIAGNOSIS — Z7189 Other specified counseling: Secondary | ICD-10-CM | POA: Diagnosis not present

## 2019-07-05 DIAGNOSIS — I517 Cardiomegaly: Secondary | ICD-10-CM | POA: Diagnosis not present

## 2019-07-05 LAB — SARS CORONAVIRUS 2 BY RT PCR (HOSPITAL ORDER, PERFORMED IN ~~LOC~~ HOSPITAL LAB): SARS Coronavirus 2: NEGATIVE

## 2019-07-05 LAB — URINALYSIS, ROUTINE W REFLEX MICROSCOPIC
Glucose, UA: NEGATIVE mg/dL
Ketones, ur: NEGATIVE mg/dL
Leukocytes,Ua: NEGATIVE
Nitrite: NEGATIVE
Protein, ur: 100 mg/dL — AB
Specific Gravity, Urine: >1.03 — ABNORMAL HIGH (ref 1.005–1.030)
pH: 5.5 (ref 5.0–8.0)

## 2019-07-05 LAB — COMPREHENSIVE METABOLIC PANEL
ALT: 22 U/L (ref 0–44)
AST: 30 U/L (ref 15–41)
Albumin: 4.1 g/dL (ref 3.5–5.0)
Alkaline Phosphatase: 47 U/L (ref 38–126)
Anion gap: 15 (ref 5–15)
BUN: 20 mg/dL (ref 8–23)
CO2: 22 mmol/L (ref 22–32)
Calcium: 10 mg/dL (ref 8.9–10.3)
Chloride: 99 mmol/L (ref 98–111)
Creatinine, Ser: 1.4 mg/dL — ABNORMAL HIGH (ref 0.44–1.00)
GFR calc Af Amer: 40 mL/min — ABNORMAL LOW (ref >60)
GFR calc non Af Amer: 34 mL/min — ABNORMAL LOW (ref >60)
Glucose, Bld: 149 mg/dL — ABNORMAL HIGH (ref 70–99)
Potassium: 4.1 mmol/L (ref 3.5–5.1)
Sodium: 136 mmol/L (ref 135–145)
Total Bilirubin: 1.1 mg/dL (ref 0.3–1.2)
Total Protein: 7.5 g/dL (ref 6.5–8.1)

## 2019-07-05 LAB — CBC
HCT: 45.3 % (ref 36.0–46.0)
Hemoglobin: 15.4 g/dL — ABNORMAL HIGH (ref 12.0–15.0)
MCH: 29.6 pg (ref 26.0–34.0)
MCHC: 34 g/dL (ref 30.0–36.0)
MCV: 86.9 fL (ref 80.0–100.0)
Platelets: 190 10*3/uL (ref 150–400)
RBC: 5.21 MIL/uL — ABNORMAL HIGH (ref 3.87–5.11)
RDW: 16.5 % — ABNORMAL HIGH (ref 11.5–15.5)
WBC: 4.8 10*3/uL (ref 4.0–10.5)
nRBC: 0 % (ref 0.0–0.2)

## 2019-07-05 LAB — URINALYSIS, MICROSCOPIC (REFLEX)

## 2019-07-05 LAB — LIPASE, BLOOD: Lipase: 39 U/L (ref 11–51)

## 2019-07-05 MED ORDER — ONDANSETRON 4 MG PO TBDP: 4.0000 mg | ORAL_TABLET | Freq: Once | ORAL | Status: DC

## 2019-07-05 MED ORDER — LORAZEPAM 2 MG/ML IJ SOLN
1.0000 mg | Freq: Once | INTRAMUSCULAR | Status: AC
  Administered 2019-07-05: 22:00:00 1 mg via INTRAVENOUS
  Filled 2019-07-05: qty 1

## 2019-07-05 MED ORDER — SODIUM CHLORIDE 0.9 % IV SOLN: Freq: Once | INTRAVENOUS | Status: DC

## 2019-07-05 MED ORDER — SODIUM CHLORIDE 0.9% FLUSH: 3.0000 mL | Freq: Once | INTRAVENOUS | Status: DC

## 2019-07-05 MED ORDER — LIDOCAINE VISCOUS HCL 2 % MT SOLN
15.0000 mL | Freq: Once | OROMUCOSAL | Status: AC
  Administered 2019-07-05: 15 mL via OROMUCOSAL
  Filled 2019-07-05: qty 15

## 2019-07-05 MED ORDER — FENTANYL CITRATE (PF) 100 MCG/2ML IJ SOLN
50.0000 ug | Freq: Once | INTRAMUSCULAR | Status: AC
  Administered 2019-07-05: 50 ug via INTRAVENOUS
  Filled 2019-07-05: qty 2

## 2019-07-05 MED ORDER — ONDANSETRON HCL 4 MG/2ML IJ SOLN
4.0000 mg | Freq: Once | INTRAMUSCULAR | Status: AC
  Administered 2019-07-05: 4 mg via INTRAVENOUS
  Filled 2019-07-05: qty 2

## 2019-07-05 NOTE — Telephone Encounter (Signed)
Incoming call from Pt.  Who reports that her abdomin hurts rated severe onset was last night. Pt.  statest that she has vomited 4 to 5 times last night and 45 times today.  Has not been able to take her heart medication today because she cant keep it down.  Nothing she takes helps.  Reviewed protocol with Pt. Reccomened pt. Go to Ed to be further evaluated. Pt. States that she will go to [facility] long        Reason for Disposition . Patient sounds very sick or weak to the triager  Answer Assessment - Initial Assessment Questions 1. LOCATION: "Where does it hurt?"     All around 2. RADIATION: "Does the pain shoot anywhere else?" (e.g., chest, back)    denies 3. ONSET: "When did the pain begin?" (e.g., minutes, hours or days ago)      Last night 1000 pm 4. SUDDEN: "Gradual or sudden onset?"     sudden 5. PATTERN "Does the pain come and go, or is it constant?"    - If constant: "Is it getting better, staying the same, or worsening?"      (Note: Constant means the pain never goes away completely; most serious pain is constant and it progresses)     - If intermittent: "How long does it last?" "Do you have pain now?"     (Note: Intermittent means the pain goes away completely between bouts)     constant 6. SEVERITY: "How bad is the pain?"  (e.g., Scale 1-10; mild, moderate, or severe)    - MILD (1-3): doesn't interfere with normal activities, abdomen soft and not tender to touch     - MODERATE (4-7): interferes with normal activities or awakens from sleep, tender to touch     - SEVERE (8-10): excruciating pain, doubled over, unable to do any normal activities      severe 7. RECURRENT SYMPTOM: "Have you ever had this type of abdominal pain before?" If so, ask: "When was the last time?" and "What happened that time?"      *No Answer* 8. AGGRAVATING FACTORS: "Does anything seem to cause this pain?" (e.g., foods, stress, alcohol)     Nothing helps 9. CARDIAC SYMPTOMS: "Do you have any of the  following symptoms: chest pain, difficulty breathing, sweating, nausea?"    denies 10. OTHER SYMPTOMS: "Do you have any other symptoms?" (e.g., fever, vomiting, diarrhea)       Vomiting  11. PREGNANCY: "Is there any chance you are pregnant?" "When was your last menstrual period?"       *No Answer*  Protocols used: ABDOMINAL PAIN - UPPER-A-AH

## 2019-07-05 NOTE — ED Triage Notes (Signed)
Pt presents with generalized abdominal pain, nausea, vomiting, and diarrhea since waking up this morning.

## 2019-07-05 NOTE — Telephone Encounter (Signed)
Pt. Reports she started vomiting last night. Can keep water down for awhile, then comes back up. No diarrhea. No fever. Requests Dr/ Plotnikov send something for nausea and vomiting to her pharmacy. Please advise pt.  Answer Assessment - Initial Assessment Questions 1. VOMITING SEVERITY: "How many times have you vomited in the past 24 hours?"     - MILD:  1 - 2 times/day    - MODERATE: 3 - 5 times/day, decreased oral intake without significant weight loss or symptoms of dehydration    - SEVERE: 6 or more times/day, vomits everything or nearly everything, with significant weight loss, symptoms of dehydration      6 and maybe more 2. ONSET: "When did the vomiting begin?"      Last night 3. FLUIDS: "What fluids or food have you vomited up today?" "Have you been able to keep any fluids down?"     Drinking fluid 4. ABDOMINAL PAIN: "Are your having any abdominal pain?" If yes : "How bad is it and what does it feel like?" (e.g., crampy, dull, intermittent, constant)       5 5. DIARRHEA: "Is there any diarrhea?" If so, ask: "How many times today?"      No 6. CONTACTS: "Is there anyone else in the family with the same symptoms?"      No 7. CAUSE: "What do you think is causing your vomiting?"     Unsure 8. HYDRATION STATUS: "Any signs of dehydration?" (e.g., dry mouth [not only dry lips], too weak to stand) "When did you last urinate?"     No 9. OTHER SYMPTOMS: "Do you have any other symptoms?" (e.g., fever, headache, vertigo, vomiting blood or coffee grounds, recent head injury)     Chills 10. PREGNANCY: "Is there any chance you are pregnant?" "When was your last menstrual period?"       n/a  Protocols used: [facility]

## 2019-07-05 NOTE — ED Triage Notes (Signed)
Pt states she has had abd pain, n/v/d since yesterday. Denies fevers.

## 2019-07-05 NOTE — ED Provider Notes (Signed)
[place]    CSN: [id number] Arrival date & time: 07/05/19  1651      History   Chief Complaint Chief Complaint  Patient presents with  . Abdominal Pain  . Vomiting  . Diarrhea    HPI [name] [name] is a 85 y.o. female.   Patient presents with 7/10 abdominal pain since 10 PM last night.  She reports 6 episodes of emesis today.  She also reports nausea and generalized weakness.  No episodes of diarrhea today; Last BM yesterday.  She denies fever.  Patient has a complicated medical history which includes diverticulitis, cholecystitis, hypokalemia, heart failure, CKD, A-fib.  The history is provided by the patient.    Past Medical History:  Diagnosis Date  . Acute lymphadenitis of arm   . Anemia    iron deficiency  . Atrial fibrillation ([place])   . Breast cancer ([place])   . Chronic combined systolic and diastolic CHF, NYHA class 2 and ACA/AHA stage C 04/2019   Previously had mildly reduced EF of 45%, reduced to 20-25% as of August 2020 with global hypokinesis and septal-apical akinesis.  . Depression 2009   grief  . Dilated cardiomyopathy ([place]) 04/2019   Presumed to be nonischemic (normal coronary CTA January 2020).  EF by echo 20 to 25% and by CMR 23%.  Severely reduced RV function at 33% EF.Marland Kitchen Severe biatrial enlargement.  Elevated RV P and LVEDP.  Dilated IVC.Marland Kitchen CMRI suggests septal scar stripe.  . Diverticulosis of colon 2004   Dr Deatra Ina  . GERD (gastroesophageal reflux disease)   . Heart murmur   . Helicobacter pylori gastritis   . Hematuria    Dr Terance Hart  . Hyperlipidemia   . Hypertension   . Osteoarthritis, knee   . Skin cancer   . Vitamin B12 deficiency   . Vitamin D deficiency     Patient Active Problem List   Diagnosis Date Noted  . Diarrhea 06/29/2019  . Dilated cardiomyopathy ([place]) 05/20/2019  . Left atrial mass   . Unintentional weight loss 04/10/2019  . Chest pain 04/09/2019  . Elevated troponin 04/09/2019  . CKD (chronic kidney  disease), stage III 04/09/2019  . Chronic combined systolic and diastolic heart failure ([place]) 03/08/2019  . Anosmia 08/18/2018  . Sweats, menopausal 08/18/2018  . Urinary incontinence 08/18/2018  . UTI (urinary tract infection) due to Enterococcus 08/06/2018  . Chest discomfort 08/04/2018  . Permanent atrial fibrillation:  CHA2DS2-VASc Score ([place]) 07/28/2018  . Sciatica associated with disorder of lumbar spine 02/26/2018  . Hip pain, chronic, left 02/26/2018  . Actinic keratosis 07/13/2017  . Wart viral 07/01/2017  . Right leg pain 11/03/2016  . Knee pain, left 07/17/2016  . Bell palsy 10/22/2015  . Taste sense altered 10/22/2015  . Mouth droop due to facial weakness 10/12/2015  . Sialoadenitis of submandibular gland 10/04/2015  . Fatigue 10/04/2015  . Cold intolerance 04/23/2015  . Headache 12/29/2014  . Total knee replacement status 05/24/2014  . Lymphedema of upper extremity following lymphadenectomy 05/22/2014  . Preop exam for internal medicine 05/22/2014  . Breast cancer ([place]) 03/17/2014  . Chronic cholecystitis 11/15/2013  . Abnormal gallbladder x-ray 11/08/2013  . Diverticulitis of colon without hemorrhage 11/02/2013  . Hematuria 11/02/2013  . Hypokalemia 11/02/2013  . Obstructive chronic bronchitis with exacerbation ([place]) 09/05/2013  . Mass of right forearm 02/07/2013  . Well adult exam 02/04/2013  . Lipoma of forearm 02/04/2013  . Mass of left side of neck 01/06/2012  . Nausea &  vomiting 04/16/2011  . Chills 04/16/2011  . LLQ abdominal pain 04/16/2011  . GERD 06/27/2010  . SKIN CANCER, HX OF 01/31/2010  . CHILLS WITHOUT FEVER 05/04/2009  . Rash and nonspecific skin eruption 11/21/2008  . Herpes zoster 10/04/2008  . PARESTHESIA 10/04/2008  . GRIEF REACTION 08/15/2008  . Depression 08/15/2008  . Osteoarthritis 08/15/2008  . HYPOKALEMIA 12/22/2007  . Weight loss 10/21/2007  . Pain in Soft Tissues of Limb 06/21/2007  . B12 deficiency 03/27/2007  . Vitamin  D deficiency 03/27/2007  . Hyperlipidemia with target LDL less than 100 03/27/2007  . ANEMIA-IRON DEFICIENCY 03/27/2007  . Insomnia 03/27/2007  . Essential hypertension 03/27/2007  . BRONCHITIS, ACUTE 03/27/2007  . DIVERTICULOSIS, COLON 03/27/2007    Past Surgical History:  Procedure Laterality Date  . ABDOMINAL HYSTERECTOMY    . Cardiac MRI  04/19/2019   Severe LV dilation with global hypokinesis.  EF roughly 23%.  Moderate RV dilation with moderately reduced EF of 33%.  Basal septal mid wall enhancement suggest scar, which could be an indication of a worse prognosis for nonischemic cardiomyopathy.   NO LA mass noted  . CARDIOVERSION N/A 09/06/2018   Procedure: CARDIOVERSION;  Surgeon: Fay Records, MD;  Location: [facility] ENDOSCOPY;  Service: Cardiovascular;  Laterality: N/A; --> after initial success with sinus rhythm, the patient went back into A. fib within 2 minutes.  . CORONARY CT ANGIOGRAM  09/14/2018   Coronary Calcium Score 276.  Mild mixed plaque in the left main with minimal stenosis.  Mixed plaque in the ostial LAD, proximal LCx and moderate size OM1, as well as proximal RCA (<50% stenosis throughout.).  Nonobstructive CAD.  Intermediate risk  . JOINT REPLACEMENT    . L groin skin cancer  2011  . MASTECTOMY  09-13-08   left and right  . TOTAL KNEE ARTHROPLASTY  06-04-98  . TOTAL KNEE ARTHROPLASTY Right 05/24/2014   Procedure: RIGHT TOTAL KNEE ARTHROPLASTY;  Surgeon: Newt Minion, MD;  Location: [place];  Service: Orthopedics;  Laterality: Right;  . TRANSTHORACIC ECHOCARDIOGRAM  08/11/2018   Persistent A. fib: Mildly reduced EF of 45 to 50% but diffuse HK.  If A. fib the entire time.  Moderate LA and moderate to severe RA dilation (suggesting longstanding A. fib, and potentially explaining difficulty cardioversion).  No significant valve disease.  Aortic sclerosis no stenosis.  Unable to assess diastolic function with A. fib  . TRANSTHORACIC ECHOCARDIOGRAM  04/10/2019   Global  hypokinesis with EF of 20-25%.  Evidence of elevated filling pressures noted -> GRII DD.  Basal and mid inferior-anteroseptal and apical akinesis.  Mildly reduced RV function with moderate elevated RVP ~53 milli-mercury.  Severe biatrial dilation.  Moderate aortic sclerosis but no stenosis.  Dilated IVC with normal respiratory variability.  Possible LAA mass noted.  Cannot exclude thrombus.    OB History   No obstetric history on file.      Home Medications    Prior to Admission medications   Medication Sig Start Date End Date Taking? Authorizing Provider  acetaminophen (TYLENOL) 500 MG tablet Take 500 mg by mouth 2 (two) times daily as needed for moderate pain.     [provider]  apixaban (ELIQUIS) 5 MG TABS tablet Take 1 tablet (5 mg total) by mouth 2 (two) times daily. 06/17/19   Leonie Man, MD  [facility] B-12 500 MCG SUBL Place 2 tablets (1,000 mcg total) under the tongue daily. 04/21/18   Plotnikov, Evie Lacks, MD  diphenhydrAMINE [facility]) 25  MG tablet Take 25 mg by mouth at bedtime.     [provider]  diphenoxylate-atropine (LOMOTIL) 2.5-0.025 MG tablet Take 1 tablet by mouth 4 (four) times daily as needed for diarrhea or loose stools. 06/29/19   Plotnikov, Evie Lacks, MD  furosemide (LASIX) 40 MG tablet Take 1 tablet (40 mg total) by mouth daily. MAY AN ADDITIONAL DOES IF WEIGHT IS GREATER THAN 3 LBS OR AS DIRECTED 05/20/19   Leonie Man, MD  HYDROcodone-acetaminophen (NORCO/VICODIN) 5-325 MG tablet Take 0.5-1 tablets by mouth every 6 (six) hours as needed for severe pain. 06/29/19 06/28/20  Plotnikov, Evie Lacks, MD  Multiple Vitamin (MULTIVITAMIN WITH MINERALS) TABS tablet Take 1 tablet by mouth daily.    [provider]  pantoprazole (PROTONIX) 40 MG tablet Take 1 tablet (40 mg total) by mouth 2 (two) times daily. 09/28/18   Plotnikov, Evie Lacks, MD  potassium chloride SA (KLOR-CON M20) 20 MEQ tablet Take 1 tablet (20 mEq total) by mouth 2 (two) times  daily. TAKE AN ADDITIONAL DOSE IT EXTRA FUROSEMIDE IS TAKEN 05/20/19   Leonie Man, MD  pravastatin (PRAVACHOL) 20 MG tablet Take 1 tablet (20 mg total) by mouth daily. 07/19/18 07/19/19  Plotnikov, Evie Lacks, MD  spironolactone (ALDACTONE) 25 MG tablet Take 0.5 tablets (12.5 mg total) by mouth daily. 05/20/19 08/18/19  Leonie Man, MD  temazepam (RESTORIL) 15 MG capsule Take 1 capsule (15 mg total) by mouth at bedtime as needed for sleep. 06/15/19   Plotnikov, Evie Lacks, MD  triamcinolone cream (KENALOG) 0.5 % APPLY  CREAM TOPICALLY TO AFFECTED AREA 4 TIMES DAILY ON RASH 06/27/19   Plotnikov, Evie Lacks, MD    Family History Family History  Problem Relation Age of Onset  . Cancer Sister        breast  . Cancer Maternal Aunt        breast  . Coronary artery disease Other   . Cancer Other        aunt, niece  . Atrial fibrillation Other     Social History Social History   Tobacco Use  . Smoking status: Never Smoker  . Smokeless tobacco: Never Used  Substance Use Topics  . Alcohol use: No  . Drug use: No     Allergies   Anastrozole, Aspirin, Ciprofloxacin, and Letrozole   Review of Systems Review of Systems  Constitutional: Negative for chills and fever.  HENT: Negative for ear pain and sore throat.   Eyes: Negative for pain and visual disturbance.  Respiratory: Negative for cough and shortness of breath.   Cardiovascular: Negative for chest pain and palpitations.  Gastrointestinal: Positive for abdominal pain, diarrhea and vomiting.  Genitourinary: Negative for dysuria and hematuria.  Musculoskeletal: Negative for arthralgias and back pain.  Skin: Negative for color change and rash.  Neurological: Positive for weakness. Negative for seizures and syncope.  All other systems reviewed and are negative.    Physical Exam Triage Vital Signs ED Triage Vitals  Enc Vitals Group     BP      Pulse      Resp      Temp      Temp src      SpO2      Weight       Height      Head Circumference      Peak Flow      Pain Score      Pain Loc      Pain Edu?  Excl. in [place]?    No data found.  Updated Vital Signs BP 134/84 (BP Location: Right Arm)   Pulse 79   Temp 98.3 F (36.8 C) (Oral)   Resp 16   SpO2 100%   Visual Acuity Right Eye Distance:   Left Eye Distance:   Bilateral Distance:    Right Eye Near:   Left Eye Near:    Bilateral Near:     Physical Exam Vitals signs and nursing note reviewed.  Constitutional:      General: She is not in acute distress.    Appearance: She is well-developed. She is ill-appearing.  HENT:     Head: Normocephalic and atraumatic.  Eyes:     Conjunctiva/sclera: Conjunctivae normal.  Neck:     Musculoskeletal: Neck supple.  Cardiovascular:     Rate and Rhythm: Normal rate and regular rhythm.     Heart sounds: No murmur.  Pulmonary:     Effort: Pulmonary effort is normal. No respiratory distress.     Breath sounds: Normal breath sounds.  Abdominal:     General: Bowel sounds are normal.     Palpations: Abdomen is soft.     Tenderness: There is abdominal tenderness. There is guarding. There is no rebound.     Comments: Generalized abdominal tenderness to palpation.  Skin:    General: Skin is warm and dry.  Neurological:     Mental Status: She is alert.      UC Treatments / Results  Labs (all labs ordered are listed, but only abnormal results are displayed) Labs Reviewed - No data to display  EKG   Radiology No results found.  Procedures Procedures (including critical care time)  Medications Ordered in UC Medications - No data to display  Initial Impression / Assessment and Plan / UC Course  I have reviewed the triage vital signs and the nursing notes.  Pertinent labs & imaging results that were available during my care of the patient were reviewed by me and considered in my medical decision making (see chart for details).   Abdominal pain.  Nausea, vomiting, diarrhea.   Patient is ill-appearing and her abdomen is tender to palpation.  Sending her to the emergency department for evaluation.     Final Clinical Impressions(s) / UC Diagnoses   Final diagnoses:  Generalized abdominal pain  Nausea vomiting and diarrhea     Discharge Instructions     Go to the emergency department for evaluation.    ED Prescriptions    None     PDMP not reviewed this encounter.   Sharion Balloon, NP 07/05/19 1739

## 2019-07-05 NOTE — Discharge Instructions (Addendum)
Go to the emergency department for evaluation.     

## 2019-07-05 NOTE — ED Notes (Signed)
Pt came to sort RN to advise son is picking her up, she is leaving and going somewhere else.  Encouraged pt to stay, pt declined.

## 2019-07-05 NOTE — Telephone Encounter (Signed)
Please advise 

## 2019-07-05 NOTE — ED Triage Notes (Signed)
Abdominal pain, diarrhea and vomiting x 2 days.

## 2019-07-05 NOTE — ED Notes (Signed)
NG tube attempted twice by this RN and once by Ivin Booty, RN without success. EDP notified.

## 2019-07-05 NOTE — ED Notes (Signed)
Patient is being discharged from the [facility] and sent to the Emergency Department via wheelchair by staff. Per provider Barkley Boards, patient is stable but in need of higher level of care due to acute abdominal tenderness & weakness. Patient is aware and verbalizes understanding of plan of care.   Vitals:   07/05/19 1724  BP: 134/84  Pulse: 79  Resp: 16  Temp: 98.3 F (36.8 C)  SpO2: 100%

## 2019-07-05 NOTE — ED Notes (Signed)
Pt's son: Emeline Darling [phone]

## 2019-07-05 NOTE — ED Provider Notes (Addendum)
[facility] EMERGENCY DEPARTMENT Provider Note   CSN: [id number] Arrival date & time: 07/05/19  1954     History   Chief Complaint Chief Complaint  Patient presents with   Abdominal Pain    HPI [name] [name] is a 85 y.o. female.     The history is provided by the patient.  Abdominal Pain Pain location:  Generalized Pain quality: aching and cramping   Pain radiates to:  Does not radiate Pain severity:  Mild Onset quality:  Gradual Timing:  Constant Progression:  Unchanged Chronicity:  New Context: previous surgery   Relieved by:  Nothing Worsened by:  Nothing Associated symptoms: nausea and vomiting   Associated symptoms: no chest pain, no chills, no constipation, no cough, no diarrhea, no dysuria, no fatigue, no fever, no hematemesis, no hematochezia, no hematuria, no shortness of breath, no sore throat, no vaginal bleeding and no vaginal discharge   Risk factors: multiple surgeries     Past Medical History:  Diagnosis Date   Acute lymphadenitis of arm    Anemia    iron deficiency   Atrial fibrillation (HCC)    Breast cancer (HCC)    Chronic combined systolic and diastolic CHF, NYHA class 2 and ACA/AHA stage C 04/2019   Previously had mildly reduced EF of 45%, reduced to 20-25% as of August 2020 with global hypokinesis and septal-apical akinesis.   Depression 2009   grief   Dilated cardiomyopathy ([place]) 04/2019   Presumed to be nonischemic (normal coronary CTA January 2020).  EF by echo 20 to 25% and by CMR 23%.  Severely reduced RV function at 33% EF.Marland Kitchen Severe biatrial enlargement.  Elevated RV P and LVEDP.  Dilated IVC.Marland Kitchen CMRI suggests septal scar stripe.   Diverticulosis of colon 2004   Dr Deatra Ina   GERD (gastroesophageal reflux disease)    Heart murmur    Helicobacter pylori gastritis    Hematuria    Dr Terance Hart   Hyperlipidemia    Hypertension    Osteoarthritis, knee    Skin cancer    Vitamin B12 deficiency    Vitamin D  deficiency     Patient Active Problem List   Diagnosis Date Noted   Diarrhea 06/29/2019   Dilated cardiomyopathy ([place]) 05/20/2019   Left atrial mass    Unintentional weight loss 04/10/2019   Chest pain 04/09/2019   Elevated troponin 04/09/2019   CKD (chronic kidney disease), stage III 04/09/2019   Chronic combined systolic and diastolic heart failure ([place]) 03/08/2019   Anosmia 08/18/2018   Sweats, menopausal 08/18/2018   Urinary incontinence 08/18/2018   UTI (urinary tract infection) due to Enterococcus 08/06/2018   Chest discomfort 08/04/2018   Permanent atrial fibrillation:  CHA2DS2-VASc Score ([facility]) 07/28/2018   Sciatica associated with disorder of lumbar spine 02/26/2018   Hip pain, chronic, left 02/26/2018   Actinic keratosis 07/13/2017   Wart viral 07/01/2017   Right leg pain 11/03/2016   Knee pain, left 07/17/2016   Bell palsy 10/22/2015   Taste sense altered 10/22/2015   Mouth droop due to facial weakness 10/12/2015   Sialoadenitis of submandibular gland 10/04/2015   Fatigue 10/04/2015   Cold intolerance 04/23/2015   Headache 12/29/2014   Total knee replacement status 05/24/2014   Lymphedema of upper extremity following lymphadenectomy 05/22/2014   Preop exam for internal medicine 05/22/2014   Breast cancer ([place]) 03/17/2014   Chronic cholecystitis 11/15/2013   Abnormal gallbladder x-ray 11/08/2013   Diverticulitis of colon without hemorrhage 11/02/2013   Hematuria 11/02/2013  Hypokalemia 11/02/2013   Obstructive chronic bronchitis with exacerbation ([place]) 09/05/2013   Mass of right forearm 02/07/2013   Well adult exam 02/04/2013   Lipoma of forearm 02/04/2013   Mass of left side of neck 01/06/2012   Nausea & vomiting 04/16/2011   Chills 04/16/2011   LLQ abdominal pain 04/16/2011   GERD 06/27/2010   SKIN CANCER, HX OF 01/31/2010   CHILLS WITHOUT FEVER 05/04/2009   Rash and nonspecific skin eruption  11/21/2008   Herpes zoster 10/04/2008   PARESTHESIA 10/04/2008   GRIEF REACTION 08/15/2008   Depression 08/15/2008   Osteoarthritis 08/15/2008   HYPOKALEMIA 12/22/2007   Weight loss 10/21/2007   Pain in Soft Tissues of Limb 06/21/2007   B12 deficiency 03/27/2007   Vitamin D deficiency 03/27/2007   Hyperlipidemia with target LDL less than 100 03/27/2007   ANEMIA-IRON DEFICIENCY 03/27/2007   Insomnia 03/27/2007   Essential hypertension 03/27/2007   BRONCHITIS, ACUTE 03/27/2007   DIVERTICULOSIS, COLON 03/27/2007    Past Surgical History:  Procedure Laterality Date   ABDOMINAL HYSTERECTOMY     Cardiac MRI  04/19/2019   Severe LV dilation with global hypokinesis.  EF roughly 23%.  Moderate RV dilation with moderately reduced EF of 33%.  Basal septal mid wall enhancement suggest scar, which could be an indication of a worse prognosis for nonischemic cardiomyopathy.   NO LA mass noted   CARDIOVERSION N/A 09/06/2018   Procedure: CARDIOVERSION;  Surgeon: Fay Records, MD;  Location: [facility] ENDOSCOPY;  Service: Cardiovascular;  Laterality: N/A; --> after initial success with sinus rhythm, the patient went back into A. fib within 2 minutes.   CORONARY CT ANGIOGRAM  09/14/2018   Coronary Calcium Score 276.  Mild mixed plaque in the left main with minimal stenosis.  Mixed plaque in the ostial LAD, proximal LCx and moderate size OM1, as well as proximal RCA (<50% stenosis throughout.).  Nonobstructive CAD.  Intermediate risk   JOINT REPLACEMENT     L groin skin cancer  2011   MASTECTOMY  09-13-08   left and right   TOTAL KNEE ARTHROPLASTY  06-04-98   TOTAL KNEE ARTHROPLASTY Right 05/24/2014   Procedure: RIGHT TOTAL KNEE ARTHROPLASTY;  Surgeon: Newt Minion, MD;  Location: [facility];  Service: Orthopedics;  Laterality: Right;   TRANSTHORACIC ECHOCARDIOGRAM  08/11/2018   Persistent A. fib: Mildly reduced EF of 45 to 50% but diffuse HK.  If A. fib the entire time.  Moderate LA and  moderate to severe RA dilation (suggesting longstanding A. fib, and potentially explaining difficulty cardioversion).  No significant valve disease.  Aortic sclerosis no stenosis.  Unable to assess diastolic function with A. fib   TRANSTHORACIC ECHOCARDIOGRAM  04/10/2019   Global hypokinesis with EF of 20-25%.  Evidence of elevated filling pressures noted -> GRII DD.  Basal and mid inferior-anteroseptal and apical akinesis.  Mildly reduced RV function with moderate elevated RVP ~53 milli-mercury.  Severe biatrial dilation.  Moderate aortic sclerosis but no stenosis.  Dilated IVC with normal respiratory variability.  Possible LAA mass noted.  Cannot exclude thrombus.     OB History   No obstetric history on file.      Home Medications    Prior to Admission medications   Medication Sig Start Date End Date Taking? Authorizing Provider  acetaminophen (TYLENOL) 500 MG tablet Take 500 mg by mouth 2 (two) times daily as needed for moderate pain.     [provider]  apixaban (ELIQUIS) 5 MG TABS tablet Take 1  tablet (5 mg total) by mouth 2 (two) times daily. 06/17/19   Leonie Man, MD  [facility] B-12 500 MCG SUBL Place 2 tablets (1,000 mcg total) under the tongue daily. 04/21/18   Plotnikov, Evie Lacks, MD  diphenhydrAMINE (SOMINEX) 25 MG tablet Take 25 mg by mouth at bedtime.     [provider]  diphenoxylate-atropine (LOMOTIL) 2.5-0.025 MG tablet Take 1 tablet by mouth 4 (four) times daily as needed for diarrhea or loose stools. 06/29/19   Plotnikov, Evie Lacks, MD  furosemide (LASIX) 40 MG tablet Take 1 tablet (40 mg total) by mouth daily. MAY AN ADDITIONAL DOES IF WEIGHT IS GREATER THAN 3 LBS OR AS DIRECTED 05/20/19   Leonie Man, MD  HYDROcodone-acetaminophen (NORCO/VICODIN) 5-325 MG tablet Take 0.5-1 tablets by mouth every 6 (six) hours as needed for severe pain. 06/29/19 06/28/20  Plotnikov, Evie Lacks, MD  Multiple Vitamin (MULTIVITAMIN WITH MINERALS) TABS tablet Take 1  tablet by mouth daily.    [provider]  pantoprazole (PROTONIX) 40 MG tablet Take 1 tablet (40 mg total) by mouth 2 (two) times daily. 09/28/18   Plotnikov, Evie Lacks, MD  potassium chloride SA (KLOR-CON M20) 20 MEQ tablet Take 1 tablet (20 mEq total) by mouth 2 (two) times daily. TAKE AN ADDITIONAL DOSE IT EXTRA FUROSEMIDE IS TAKEN 05/20/19   Leonie Man, MD  pravastatin (PRAVACHOL) 20 MG tablet Take 1 tablet (20 mg total) by mouth daily. 07/19/18 07/19/19  Plotnikov, Evie Lacks, MD  spironolactone (ALDACTONE) 25 MG tablet Take 0.5 tablets (12.5 mg total) by mouth daily. 05/20/19 08/18/19  Leonie Man, MD  temazepam (RESTORIL) 15 MG capsule Take 1 capsule (15 mg total) by mouth at bedtime as needed for sleep. 06/15/19   Plotnikov, Evie Lacks, MD  triamcinolone cream (KENALOG) 0.5 % APPLY  CREAM TOPICALLY TO AFFECTED AREA 4 TIMES DAILY ON RASH 06/27/19   Plotnikov, Evie Lacks, MD    Family History Family History  Problem Relation Age of Onset   Cancer Sister        breast   Cancer Maternal Aunt        breast   Coronary artery disease Other    Cancer Other        aunt, niece   Atrial fibrillation Other     Social History Social History   Tobacco Use   Smoking status: Never Smoker   Smokeless tobacco: Never Used  Substance Use Topics   Alcohol use: No   Drug use: No     Allergies   Anastrozole, Aspirin, Ciprofloxacin, and Letrozole   Review of Systems Review of Systems  Constitutional: Negative for chills, fatigue and fever.  HENT: Negative for ear pain and sore throat.   Eyes: Negative for pain and visual disturbance.  Respiratory: Negative for cough and shortness of breath.   Cardiovascular: Negative for chest pain and palpitations.  Gastrointestinal: Positive for abdominal pain, nausea and vomiting. Negative for constipation, diarrhea, hematemesis and hematochezia.  Genitourinary: Negative for dysuria, hematuria, vaginal bleeding and vaginal  discharge.  Musculoskeletal: Negative for arthralgias and back pain.  Skin: Negative for color change and rash.  Neurological: Negative for seizures and syncope.  All other systems reviewed and are negative.    Physical Exam Updated Vital Signs  ED Triage Vitals  Enc Vitals Group     BP 07/05/19 2012 139/87     Pulse Rate 07/05/19 2012 (!) 110     Resp 07/05/19 2012 20  Temp 07/05/19 2012 98 F (36.7 C)     Temp Source 07/05/19 2012 Oral     SpO2 07/05/19 2012 98 %     Weight 07/05/19 2008 171 lb 15.3 oz (78 kg)     Height 07/05/19 2008 5\' 2"  (1.575 m)     Head Circumference --      Peak Flow --      Pain Score 07/05/19 2008 10     Pain Loc --      Pain Edu? --      Excl. in [place]? --     Physical Exam Vitals signs and nursing note reviewed.  Constitutional:      General: She is not in acute distress.    Appearance: She is well-developed. She is ill-appearing.  HENT:     Head: Normocephalic and atraumatic.     Mouth/Throat:     Mouth: Mucous membranes are moist.  Eyes:     Extraocular Movements: Extraocular movements intact.     Conjunctiva/sclera: Conjunctivae normal.     Pupils: Pupils are equal, round, and reactive to light.  Neck:     Musculoskeletal: Neck supple.  Cardiovascular:     Rate and Rhythm: Normal rate and regular rhythm.     Heart sounds: Normal heart sounds. No murmur.  Pulmonary:     Effort: Pulmonary effort is normal. No respiratory distress.     Breath sounds: Normal breath sounds.  Abdominal:     General: Abdomen is flat. There is no distension.     Palpations: Abdomen is soft.     Tenderness: There is generalized abdominal tenderness. There is guarding. There is no right CVA tenderness, left CVA tenderness or rebound. Negative signs include Murphy's sign, Rovsing's sign and McBurney's sign.  Skin:    General: Skin is warm and dry.     Capillary Refill: Capillary refill takes less than 2 seconds.  Neurological:     Mental Status: She is  alert.      ED Treatments / Results  Labs (all labs ordered are listed, but only abnormal results are displayed) Labs Reviewed  SARS CORONAVIRUS 2 BY RT PCR (HOSPITAL ORDER, [facility])  URINALYSIS, ROUTINE W REFLEX MICROSCOPIC    EKG None  Radiology Ct Abdomen Pelvis Wo Contrast  Result Date: 07/05/2019 CLINICAL DATA:  Abdominal distension, diarrhea, vomiting EXAM: CT ABDOMEN AND PELVIS WITHOUT CONTRAST TECHNIQUE: Multidetector CT imaging of the abdomen and pelvis was performed following the standard protocol without IV contrast. COMPARISON:  04/10/2019 cardiomegaly.  No acute abnormality. FINDINGS: Lower chest: Cardiomegaly.  No acute abnormality. Hepatobiliary: No focal hepatic abnormality. Gallbladder unremarkable. Pancreas: No focal abnormality or ductal dilatation. Spleen: No focal abnormality.  Normal size. Adrenals/Urinary Tract: No hydronephrosis. Small low-density lesion in the right adrenal gland is stable since prior study, likely adenoma. Urinary bladder decompressed, unremarkable. Stomach/Bowel: There are dilated fluid-filled small bowel loops in the abdomen and pelvis. Distal small bowel is decompressed. Findings compatible with small bowel obstruction. Exact transition not visualized. Findings compatible with moderate to high-grade small bowel obstruction. Stomach is dilated and fluid-filled. Vascular/Lymphatic: Aortic atherosclerosis. No enlarged abdominal or pelvic lymph nodes. Reproductive: Prior hysterectomy.  No adnexal masses. Other: Moderate free fluid in the pelvis and adjacent to the liver. No free air. Musculoskeletal: No acute bony abnormality. IMPRESSION: Dilated fluid-filled stomach and small bowel into the pelvis compatible with small bowel obstruction, likely moderate to high-grade. Distal small bowel is decompressed. Exact transition not visualized. Sigmoid diverticulosis.  No active diverticulitis. Moderate free fluid in the abdomen and  pelvis. Aortic atherosclerosis. Electronically Signed   By: Rolm Baptise M.D.   On: 07/05/2019 21:02    Procedures Procedures (including critical care time)  Medications Ordered in ED Medications  lidocaine (XYLOCAINE) 2 % viscous mouth solution 15 mL (has no administration in time range)  fentaNYL (SUBLIMAZE) injection 50 mcg (50 mcg Intravenous Given 07/05/19 2106)  ondansetron (ZOFRAN) injection 4 mg (4 mg Intravenous Given 07/05/19 2106)     Initial Impression / Assessment and Plan / ED Course  I have reviewed the triage vital signs and the nursing notes.  Pertinent labs & imaging results that were available during my care of the patient were reviewed by me and considered in my medical decision making (see chart for details).     [name] [name] is an 85 year old female with history of atrial fibrillation on blood thinners, diverticulosis, high cholesterol, hypertension, heart failure who presents the ED with abdominal pain.  Patient with unremarkable vitals.  No fever.  Has had some nausea, vomiting, abdominal pain all day today.  Unable to tolerate p.o. was seen at urgent care and had unremarkable CBC, CMP, lipase.  Was sent for further evaluation.  States that she had a bowel movement yesterday.  Has history of abdominal surgeries.  Has diffuse tenderness on exam.  Concern for bowel obstruction versus diverticulitis or colitis.  No recent antibiotic use.  Will get a CT scan.  Will obtain a urinalysis.  Will give IV fentanyl and IV Zofran.  Will reevaluate after imaging.  Patient appears to have small bowel obstruction on CT scan.  Will place NG tube.  We will start gentle hydration.  Will admit to [facility] long.  Dr. Barry Dienes with general surgery aware of the patient.  Have had unsuccessful attempt to place an NG tube.  Despite treating with Ativan.  Will give patient a rest at this time.  We will try again later with a smaller NG tube.  This chart was dictated using voice recognition  software.  Despite best efforts to proofread,  errors can occur which can change the documentation meaning.    Final Clinical Impressions(s) / ED Diagnoses   Final diagnoses:  SBO (small bowel obstruction) [facility])    ED Discharge Orders    None       Lennice Sites, DO 07/05/19 2128    Lennice Sites, DO 07/05/19 2216

## 2019-07-06 ENCOUNTER — Other Ambulatory Visit: Payer: Self-pay | Admitting: *Deleted

## 2019-07-06 ENCOUNTER — Inpatient Hospital Stay (HOSPITAL_COMMUNITY): Payer: Medicare Other

## 2019-07-06 DIAGNOSIS — I10 Essential (primary) hypertension: Secondary | ICD-10-CM

## 2019-07-06 DIAGNOSIS — R112 Nausea with vomiting, unspecified: Secondary | ICD-10-CM

## 2019-07-06 LAB — COMPREHENSIVE METABOLIC PANEL
ALT: 20 U/L (ref 0–44)
AST: 25 U/L (ref 15–41)
Albumin: 3.7 g/dL (ref 3.5–5.0)
Alkaline Phosphatase: 40 U/L (ref 38–126)
Anion gap: 13 (ref 5–15)
BUN: 24 mg/dL — ABNORMAL HIGH (ref 8–23)
CO2: 25 mmol/L (ref 22–32)
Calcium: 9.5 mg/dL (ref 8.9–10.3)
Chloride: 101 mmol/L (ref 98–111)
Creatinine, Ser: 1.36 mg/dL — ABNORMAL HIGH (ref 0.44–1.00)
GFR calc Af Amer: 41 mL/min — ABNORMAL LOW (ref >60)
GFR calc non Af Amer: 36 mL/min — ABNORMAL LOW (ref >60)
Glucose, Bld: 134 mg/dL — ABNORMAL HIGH (ref 70–99)
Potassium: 3.9 mmol/L (ref 3.5–5.1)
Sodium: 139 mmol/L (ref 135–145)
Total Bilirubin: 1 mg/dL (ref 0.3–1.2)
Total Protein: 6.8 g/dL (ref 6.5–8.1)

## 2019-07-06 LAB — CBC
HCT: 45.5 % (ref 36.0–46.0)
Hemoglobin: 14.8 g/dL (ref 12.0–15.0)
MCH: 28.6 pg (ref 26.0–34.0)
MCHC: 32.5 g/dL (ref 30.0–36.0)
MCV: 87.8 fL (ref 80.0–100.0)
Platelets: 185 10*3/uL (ref 150–400)
RBC: 5.18 MIL/uL — ABNORMAL HIGH (ref 3.87–5.11)
RDW: 16.7 % — ABNORMAL HIGH (ref 11.5–15.5)
WBC: 6.6 10*3/uL (ref 4.0–10.5)
nRBC: 0 % (ref 0.0–0.2)

## 2019-07-06 LAB — TROPONIN I (HIGH SENSITIVITY): Troponin I (High Sensitivity): 36 ng/L — ABNORMAL HIGH (ref <18)

## 2019-07-06 LAB — APTT
aPTT: 96 seconds — ABNORMAL HIGH (ref 24–36)
aPTT: 132 seconds — ABNORMAL HIGH (ref 24–36)

## 2019-07-06 LAB — MAGNESIUM: Magnesium: 1.8 mg/dL (ref 1.7–2.4)

## 2019-07-06 LAB — HEPARIN LEVEL (UNFRACTIONATED)
Heparin Unfractionated: 1.7 IU/mL — ABNORMAL HIGH (ref 0.30–0.70)
Heparin Unfractionated: 1.58 IU/mL — ABNORMAL HIGH (ref 0.30–0.70)

## 2019-07-06 MED ORDER — DIATRIZOATE MEGLUMINE & SODIUM 66-10 % PO SOLN
90.0000 mL | Freq: Once | ORAL | Status: AC
  Administered 2019-07-06: 90 mL via ORAL
  Filled 2019-07-06: qty 90

## 2019-07-06 MED ORDER — DIPHENHYDRAMINE HCL 25 MG PO CAPS
25.0000 mg | ORAL_CAPSULE | Freq: Every day | ORAL | Status: DC
  Administered 2019-07-06: 25 mg via ORAL
  Filled 2019-07-06: qty 1

## 2019-07-06 MED ORDER — SODIUM CHLORIDE 0.9 % IV SOLN
INTRAVENOUS | Status: AC
  Administered 2019-07-06: 03:00:00 via INTRAVENOUS

## 2019-07-06 MED ORDER — ACETAMINOPHEN 325 MG PO TABS
650.0000 mg | ORAL_TABLET | Freq: Four times a day (QID) | ORAL | Status: DC | PRN
  Administered 2019-07-06 – 2019-07-15 (×4): 650 mg via ORAL
  Filled 2019-07-06 (×4): qty 2

## 2019-07-06 MED ORDER — ORAL CARE MOUTH RINSE
15.0000 mL | Freq: Two times a day (BID) | OROMUCOSAL | Status: DC
  Administered 2019-07-06 – 2019-07-15 (×16): 15 mL via OROMUCOSAL

## 2019-07-06 MED ORDER — TEMAZEPAM 15 MG PO CAPS
15.0000 mg | ORAL_CAPSULE | Freq: Every evening | ORAL | Status: DC | PRN
  Administered 2019-07-11 – 2019-07-13 (×2): 15 mg via ORAL
  Filled 2019-07-06 (×2): qty 1

## 2019-07-06 MED ORDER — HEPARIN (PORCINE) 25000 UT/250ML-% IV SOLN
900.0000 [IU]/h | INTRAVENOUS | Status: DC
  Administered 2019-07-06: 1000 [IU]/h via INTRAVENOUS
  Administered 2019-07-07 – 2019-07-10 (×3): 900 [IU]/h via INTRAVENOUS
  Filled 2019-07-06 (×5): qty 250

## 2019-07-06 MED ORDER — POTASSIUM CHLORIDE CRYS ER 20 MEQ PO TBCR
20.0000 meq | EXTENDED_RELEASE_TABLET | Freq: Two times a day (BID) | ORAL | Status: DC
  Administered 2019-07-06 (×2): 20 meq via ORAL
  Filled 2019-07-06 (×3): qty 1

## 2019-07-06 MED ORDER — ONDANSETRON HCL 4 MG/2ML IJ SOLN
4.0000 mg | Freq: Four times a day (QID) | INTRAMUSCULAR | Status: DC | PRN
  Administered 2019-07-06 – 2019-07-15 (×10): 4 mg via INTRAVENOUS
  Filled 2019-07-06 (×10): qty 2

## 2019-07-06 MED ORDER — HYDROMORPHONE HCL 1 MG/ML IJ SOLN
0.5000 mg | Freq: Four times a day (QID) | INTRAMUSCULAR | Status: DC | PRN
  Administered 2019-07-06 – 2019-07-11 (×8): 0.5 mg via INTRAVENOUS
  Filled 2019-07-06 (×8): qty 0.5

## 2019-07-06 MED ORDER — METOPROLOL TARTRATE 5 MG/5ML IV SOLN
5.0000 mg | Freq: Once | INTRAVENOUS | Status: AC
  Administered 2019-07-06: 5 mg via INTRAVENOUS
  Filled 2019-07-06: qty 5

## 2019-07-06 MED ORDER — PROMETHAZINE HCL 25 MG/ML IJ SOLN
12.5000 mg | Freq: Four times a day (QID) | INTRAMUSCULAR | Status: DC | PRN
  Administered 2019-07-07: 12.5 mg via INTRAVENOUS
  Filled 2019-07-06: qty 1

## 2019-07-06 MED ORDER — ACETAMINOPHEN 650 MG RE SUPP
650.0000 mg | Freq: Four times a day (QID) | RECTAL | Status: DC | PRN
  Administered 2019-07-11: 09:00:00 650 mg via RECTAL
  Filled 2019-07-06: qty 1

## 2019-07-06 MED ORDER — FUROSEMIDE 10 MG/ML IJ SOLN
20.0000 mg | Freq: Every day | INTRAMUSCULAR | Status: DC
  Administered 2019-07-06 – 2019-07-07 (×2): 20 mg via INTRAVENOUS
  Filled 2019-07-06 (×2): qty 2

## 2019-07-06 MED ORDER — PANTOPRAZOLE SODIUM 40 MG IV SOLR
40.0000 mg | Freq: Every day | INTRAVENOUS | Status: DC
  Administered 2019-07-06 – 2019-07-14 (×10): 40 mg via INTRAVENOUS
  Filled 2019-07-06 (×10): qty 40

## 2019-07-06 MED ORDER — ENOXAPARIN SODIUM 40 MG/0.4ML ~~LOC~~ SOLN: 40.0000 mg | SUBCUTANEOUS | Status: DC

## 2019-07-06 NOTE — Progress Notes (Signed)
[facility] for Heparin Indication: atrial fibrillation--PTA apixaban  Allergies  Allergen Reactions  . Anastrozole Other (See Comments)    arthralgias  . Aspirin Other (See Comments)    Gi upset   . Ciprofloxacin Itching  . Letrozole Other (See Comments)    leg pain    Patient Measurements: Height: 5\' 2"  (157.5 cm) Weight: 168 lb 14 oz (76.6 kg) IBW/kg (Calculated) : 50.1 Heparin Dosing Weight:   Vital Signs: Temp: 98 F (36.7 C) (11/04 1300) Temp Source: Oral (11/04 1300) BP: 112/75 (11/04 1300) Pulse Rate: 55 (11/04 1300)  Labs: Recent Labs    07/05/19 1822 07/06/19 0426 07/06/19 1302  HGB 15.4* 14.8  --   HCT 45.3 45.5  --   PLT 190 185  --   APTT  --   --  96*  CREATININE 1.40* 1.36*  --   TROPONINIHS  --  36*  --     Estimated Creatinine Clearance: 29.5 mL/min (A) (by C-G formula based on SCr of 1.36 mg/dL (H)).   Assessment: Patient with chronic apixaban for hx of afib.  Last dose apixaban noted 11/2 with unknown time.  PTT ordered with Heparin level until both correlate due to possible drug-lab interaction between oral anticoagulant (rivaroxaban, edoxaban, or apixaban) and anti-Xa level (aka heparin level)  07/06/2019  -aPTT drawn 8 hrs after Hep infusion at rate of 1000 units/hr is therapeutic at 96 sec, but this is at the higher end of the therapeutic range.  -Heparin level not yet reported, anticipate an elevated level d/t eliquis use PTA -CBC stable -no bleeding reported    Goal of Therapy:  Heparin level 0.3-0.7 units/ml aPTT 66-102 seconds Monitor platelets by anticoagulation protocol: Yes   Plan:  Continue Heparin drip at 1000 units/hr Check confirmatory heparin level and aPTT at 2100 Daily heparin level, aPTT, & CBC while on heparin  Brenda Cowher 07/06/2019,2:18 PM

## 2019-07-06 NOTE — Progress Notes (Signed)
85 year old with prior h/o hypertension, hyperlipidemia, paroxysmal atrial fibrillation, chronic systolic heart failure, diverticulosis presents with nausea vomiting and generalized abdominal pain on admission she underwent a CT abdomen and pelvis showing dilated fluid-filled stomach and small bowel compatible with small bowel obstruction. Surgery consulted and recommended small bowel protocol and n.p.o. at this time.  Patient seen and examined at bedside she denies any nausea vomiting and abdominal pain has improved but but she has not passed any flatus and has persisted bowel distention. Please see detailed H&P by Dr. Jani Gravel earlier this morning.   Hosie Poisson, MD  [phone]

## 2019-07-06 NOTE — Plan of Care (Signed)
  Problem: Education: Goal: Knowledge of General Education information will improve Description Including pain rating scale, medication(s)/side effects and non-pharmacologic comfort measures Outcome: Progressing   

## 2019-07-06 NOTE — Patient Outreach (Signed)
[facility]) Care Management  07/06/2019  [name] [name] Jan 31, 1933 [id number]   Noted that member was admitted to hospital for SBO.  Hospital liaisons notified, will follow up with member pending discharge.  Valente David, [place], MSN [place] [phone]

## 2019-07-06 NOTE — Progress Notes (Signed)
ANTICOAGULATION CONSULT NOTE - Initial Consult  Pharmacy Consult for Heparin Indication: atrial fibrillation--PTA apixaban  Allergies  Allergen Reactions  . Anastrozole Other (See Comments)    arthralgias  . Aspirin Other (See Comments)    Gi upset   . Ciprofloxacin Itching  . Letrozole Other (See Comments)    leg pain    Patient Measurements: Height: 5\' 2"  (157.5 cm) Weight: 168 lb 14 oz (76.6 kg) IBW/kg (Calculated) : 50.1 Heparin Dosing Weight:   Vital Signs: Temp: 98.2 F (36.8 C) (11/04 0219) Temp Source: Oral (11/04 0219) BP: 115/76 (11/04 0219) Pulse Rate: 69 (11/04 0219)  Labs: Recent Labs    07/05/19 1822  HGB 15.4*  HCT 45.3  PLT 190  CREATININE 1.40*    Estimated Creatinine Clearance: 28.7 mL/min (A) (by C-G formula based on SCr of 1.4 mg/dL (H)).   Medical History: Past Medical History:  Diagnosis Date  . Acute lymphadenitis of arm   . Anemia    iron deficiency  . Atrial fibrillation ([place])   . Breast cancer ([place])   . Chronic combined systolic and diastolic CHF, NYHA class 2 and ACA/AHA stage C 04/2019   Previously had mildly reduced EF of 45%, reduced to 20-25% as of August 2020 with global hypokinesis and septal-apical akinesis.  . Depression 2009   grief  . Dilated cardiomyopathy ([place]) 04/2019   Presumed to be nonischemic (normal coronary CTA January 2020).  EF by echo 20 to 25% and by CMR 23%.  Severely reduced RV function at 33% EF.Marland Kitchen Severe biatrial enlargement.  Elevated RV P and LVEDP.  Dilated IVC.Marland Kitchen CMRI suggests septal scar stripe.  . Diverticulosis of colon 2004   Dr Deatra Ina  . GERD (gastroesophageal reflux disease)   . Heart murmur   . Helicobacter pylori gastritis   . Hematuria    Dr Terance Hart  . Hyperlipidemia   . Hypertension   . Osteoarthritis, knee   . Skin cancer   . Vitamin B12 deficiency   . Vitamin D deficiency     Medications:  Infusions:  . sodium chloride 50 mL/hr at 07/06/19 0322  . heparin       Assessment: Patient with chronic apixaban for hx of afib.  Last dose apixaban noted 11/2 with unknown time.  PTT ordered with Heparin level until both correlate due to possible drug-lab interaction between oral anticoagulant (rivaroxaban, edoxaban, or apixaban) and anti-Xa level (aka heparin level)   Goal of Therapy:  Heparin level 0.3-0.7 units/ml aPTT 66-102 seconds Monitor platelets by anticoagulation protocol: Yes   Plan:  Heparin drip at 1000 units/hr Check heparin level and PTT at [address], [place] 07/06/2019,3:26 AM

## 2019-07-06 NOTE — Telephone Encounter (Signed)
Pt went to ED

## 2019-07-06 NOTE — H&P (Addendum)
TRH H&P    Patient Demographics:    [name] [name], is a 85 y.o. female  MRN: [id number]  DOB - 07/10/1933  Admit Date - 07/05/2019  Referring MD/NP/PA: Ronnald Nian  Outpatient Primary MD for the patient is Plotnikov, Evie Lacks, MD  Patient coming from:  home  Chief complaint- n/v, abdominal pain   HPI:    [name] [name]  is a 85 y.o. female, w hypertension, hyperlipidemia, Pafib, CHF (EF 45%), depression, h/o gastritis (H. Pylori),  diverticulosis, h/o  hysterectomy, apparently presents with 1-2 days of n/v, generalized abdominal cramping/pain.  Pt denies fever, chills, hematemesis, diarrhea, constipation, brbpr, black stool.    In ED,  T 98 P 110, R 20, Bp 139/87 pox 98% on RA Wt 78kg  CT scan abd/ pelvis IMPRESSION: Dilated fluid-filled stomach and small bowel into the pelvis compatible with small bowel obstruction, likely moderate to high-grade. Distal small bowel is decompressed. Exact transition not visualized.  Sigmoid diverticulosis.  No active diverticulitis.  Moderate free fluid in the abdomen and pelvis.  Aortic atherosclerosis.  Na 136, K 4.1,  Bun 20, Creatinine 1.40 Ast 30, Alt 22 Wbc 4.8, Hgb 15.4, Plt 190  Urinalysis rbc 6-10, wbc 0-5  covid -19 negative  ED spoke with Dr. Stark Klein regarding the patient  Pt will be admitted for n/v, abd pain, secondary to SBO.    Review of systems:    In addition to the HPI above,  No Fever-chills, No Headache, No changes with Vision or hearing, No problems swallowing food or Liquids, No Chest pain, Cough or Shortness of Breath,  No Blood in stool or Urine, No dysuria, No new skin rashes or bruises, No new joints pains-aches,  No new weakness, tingling, numbness in any extremity, No recent weight gain or loss, No polyuria, polydypsia or polyphagia, No significant Mental Stressors.  All other systems reviewed and are  negative.    Past History of the following :    Past Medical History:  Diagnosis Date  . Acute lymphadenitis of arm   . Anemia    iron deficiency  . Atrial fibrillation ([place])   . Breast cancer ([place])   . Chronic combined systolic and diastolic CHF, NYHA class 2 and ACA/AHA stage C 04/2019   Previously had mildly reduced EF of 45%, reduced to 20-25% as of August 2020 with global hypokinesis and septal-apical akinesis.  . Depression 2009   grief  . Dilated cardiomyopathy ([place]) 04/2019   Presumed to be nonischemic (normal coronary CTA January 2020).  EF by echo 20 to 25% and by CMR 23%.  Severely reduced RV function at 33% EF.Marland Kitchen Severe biatrial enlargement.  Elevated RV P and LVEDP.  Dilated IVC.Marland Kitchen CMRI suggests septal scar stripe.  . Diverticulosis of colon 2004   Dr Deatra Ina  . GERD (gastroesophageal reflux disease)   . Heart murmur   . Helicobacter pylori gastritis   . Hematuria    Dr Terance Hart  . Hyperlipidemia   . Hypertension   . Osteoarthritis, knee   . Skin cancer   .  Vitamin B12 deficiency   . Vitamin D deficiency       Past Surgical History:  Procedure Laterality Date  . ABDOMINAL HYSTERECTOMY    . Cardiac MRI  04/19/2019   Severe LV dilation with global hypokinesis.  EF roughly 23%.  Moderate RV dilation with moderately reduced EF of 33%.  Basal septal mid wall enhancement suggest scar, which could be an indication of a worse prognosis for nonischemic cardiomyopathy.   NO LA mass noted  . CARDIOVERSION N/A 09/06/2018   Procedure: CARDIOVERSION;  Surgeon: Fay Records, MD;  Location: [facility] ENDOSCOPY;  Service: Cardiovascular;  Laterality: N/A; --> after initial success with sinus rhythm, the patient went back into A. fib within 2 minutes.  . CORONARY CT ANGIOGRAM  09/14/2018   Coronary Calcium Score 276.  Mild mixed plaque in the left main with minimal stenosis.  Mixed plaque in the ostial LAD, proximal LCx and moderate size OM1, as well as proximal RCA (<50% stenosis  throughout.).  Nonobstructive CAD.  Intermediate risk  . JOINT REPLACEMENT    . L groin skin cancer  2011  . MASTECTOMY  09-13-08   left and right  . TOTAL KNEE ARTHROPLASTY  06-04-98  . TOTAL KNEE ARTHROPLASTY Right 05/24/2014   Procedure: RIGHT TOTAL KNEE ARTHROPLASTY;  Surgeon: Newt Minion, MD;  Location: [facility];  Service: Orthopedics;  Laterality: Right;  . TRANSTHORACIC ECHOCARDIOGRAM  08/11/2018   Persistent A. fib: Mildly reduced EF of 45 to 50% but diffuse HK.  If A. fib the entire time.  Moderate LA and moderate to severe RA dilation (suggesting longstanding A. fib, and potentially explaining difficulty cardioversion).  No significant valve disease.  Aortic sclerosis no stenosis.  Unable to assess diastolic function with A. fib  . TRANSTHORACIC ECHOCARDIOGRAM  04/10/2019   Global hypokinesis with EF of 20-25%.  Evidence of elevated filling pressures noted -> GRII DD.  Basal and mid inferior-anteroseptal and apical akinesis.  Mildly reduced RV function with moderate elevated RVP ~53 milli-mercury.  Severe biatrial dilation.  Moderate aortic sclerosis but no stenosis.  Dilated IVC with normal respiratory variability.  Possible LAA mass noted.  Cannot exclude thrombus.      Social History:      Social History   Tobacco Use  . Smoking status: Never Smoker  . Smokeless tobacco: Never Used  Substance Use Topics  . Alcohol use: No       Family History :     Family History  Problem Relation Age of Onset  . Cancer Sister        breast  . Cancer Maternal Aunt        breast  . Coronary artery disease Other   . Cancer Other        aunt, niece  . Atrial fibrillation Other        Home Medications:   Prior to Admission medications   Medication Sig Start Date End Date Taking? Authorizing Provider  acetaminophen (TYLENOL) 500 MG tablet Take 500 mg by mouth 2 (two) times daily as needed for moderate pain.    Yes [provider]  apixaban (ELIQUIS) 5 MG TABS tablet Take  1 tablet (5 mg total) by mouth 2 (two) times daily. 06/17/19  Yes Leonie Man, MD  [facility] B-12 500 MCG SUBL Place 2 tablets (1,000 mcg total) under the tongue daily. 04/21/18  Yes Plotnikov, Evie Lacks, MD  diphenoxylate-atropine (LOMOTIL) 2.5-0.025 MG tablet Take 1 tablet by mouth 4 (four) times daily  as needed for diarrhea or loose stools. 06/29/19  Yes Plotnikov, Evie Lacks, MD  furosemide (LASIX) 40 MG tablet Take 1 tablet (40 mg total) by mouth daily. MAY AN ADDITIONAL DOES IF WEIGHT IS GREATER THAN 3 LBS OR AS DIRECTED 05/20/19  Yes Leonie Man, MD  HYDROcodone-acetaminophen (NORCO/VICODIN) 5-325 MG tablet Take 0.5-1 tablets by mouth every 6 (six) hours as needed for severe pain. 06/29/19 06/28/20 Yes Plotnikov, Evie Lacks, MD  Multiple Vitamin (MULTIVITAMIN WITH MINERALS) TABS tablet Take 1 tablet by mouth daily.   Yes [provider]  pantoprazole (PROTONIX) 40 MG tablet Take 1 tablet (40 mg total) by mouth 2 (two) times daily. 09/28/18  Yes Plotnikov, Evie Lacks, MD  potassium chloride SA (KLOR-CON M20) 20 MEQ tablet Take 1 tablet (20 mEq total) by mouth 2 (two) times daily. TAKE AN ADDITIONAL DOSE IT EXTRA FUROSEMIDE IS TAKEN 05/20/19  Yes Leonie Man, MD  pravastatin (PRAVACHOL) 20 MG tablet Take 1 tablet (20 mg total) by mouth daily. 07/19/18 07/19/19 Yes Plotnikov, Evie Lacks, MD  spironolactone (ALDACTONE) 25 MG tablet Take 0.5 tablets (12.5 mg total) by mouth daily. 05/20/19 08/18/19 Yes Leonie Man, MD  temazepam (RESTORIL) 15 MG capsule Take 1 capsule (15 mg total) by mouth at bedtime as needed for sleep. 06/15/19  Yes Plotnikov, Evie Lacks, MD  triamcinolone cream (KENALOG) 0.5 % APPLY  CREAM TOPICALLY TO AFFECTED AREA 4 TIMES DAILY ON RASH Patient taking differently: Apply 1 application topically 4 (four) times daily.  06/27/19  Yes Plotnikov, Evie Lacks, MD     Allergies:     Allergies  Allergen Reactions  . Anastrozole Other (See Comments)    arthralgias  .  Aspirin Other (See Comments)    Gi upset   . Ciprofloxacin Itching  . Letrozole Other (See Comments)    leg pain     Physical Exam:   Vitals  Blood pressure 115/76, pulse 69, temperature 98.2 F (36.8 C), temperature source Oral, resp. rate 18, height 5\' 2"  (1.575 m), weight 76.6 kg, SpO2 100 %.  1.  General: axoxo3  2. Psychiatric: euthymic  3. Neurologic: cn2-12 intact reflexes 2+ symmetric, diffuse with no clonus, motor 5/5 in all 4ext  4. HEENMT:  Anicteric, pupils 1.56mm symmetric, direct, consensual, near intact Neck: no jvd  5. Respiratory : CTAB  6. Cardiovascular : Irr, irr, s1, s2,   7. Gastrointestinal:  Abd: soft, nt, nd, +bs (normoactive)  8. Skin:  Ext: no c/c/e, no rash  9.Musculoskeletal:  Good ROM    Data Review:    CBC Recent Labs  Lab 07/05/19 1822  WBC 4.8  HGB 15.4*  HCT 45.3  PLT 190  MCV 86.9  MCH 29.6  MCHC 34.0  RDW 16.5*   ------------------------------------------------------------------------------------------------------------------  Results for orders placed or performed during the hospital encounter of 07/05/19 (from the past 48 hour(s))  Urinalysis, Routine w reflex microscopic     Status: Abnormal   Collection Time: 07/05/19  8:48 PM  Result Value Ref Range   Color, Urine YELLOW YELLOW   APPearance TURBID (A) CLEAR   Specific Gravity, Urine >1.030 (H) 1.005 - 1.030   pH 5.5 5.0 - 8.0   Glucose, UA NEGATIVE NEGATIVE mg/dL   Hgb urine dipstick LARGE (A) NEGATIVE   Bilirubin Urine SMALL (A) NEGATIVE   Ketones, ur NEGATIVE NEGATIVE mg/dL   Protein, ur 100 (A) NEGATIVE mg/dL   Nitrite NEGATIVE NEGATIVE   Leukocytes,Ua NEGATIVE NEGATIVE    Comment: Performed  at [facility], [address] [place], [place] [address]  Urinalysis, Microscopic (reflex)     Status: Abnormal   Collection Time: 07/05/19  8:48 PM  Result Value Ref Range   RBC / HPF 6-10 0 - 5 RBC/hpf   WBC, UA 0-5 0 - 5 WBC/hpf   Bacteria,  UA MANY (A) NONE SEEN   Squamous Epithelial / LPF 0-5 0 - 5   Mucus PRESENT    Hyaline Casts, UA PRESENT     Comment: Performed at [facility], [address] [place], [place] [address]  SARS Coronavirus 2 by RT PCR (hospital order, performed in [facility] hospital lab) Nasopharyngeal Nasopharyngeal Swab     Status: None   Collection Time: 07/05/19  9:34 PM   Specimen: Nasopharyngeal Swab  Result Value Ref Range   SARS Coronavirus 2 NEGATIVE NEGATIVE    Comment: (NOTE) If result is NEGATIVE SARS-CoV-2 target nucleic acids are NOT DETECTED. The SARS-CoV-2 RNA is generally detectable in upper and lower  respiratory specimens during the acute phase of infection. The lowest  concentration of SARS-CoV-2 viral copies this assay can detect is 250  copies / mL. A negative result does not preclude SARS-CoV-2 infection  and should not be used as the sole basis for treatment or other  patient management decisions.  A negative result may occur with  improper specimen collection / handling, submission of specimen other  than nasopharyngeal swab, presence of viral mutation(s) within the  areas targeted by this assay, and inadequate number of viral copies  (<250 copies / mL). A negative result must be combined with clinical  observations, patient history, and epidemiological information. If result is POSITIVE SARS-CoV-2 target nucleic acids are DETECTED. The SARS-CoV-2 RNA is generally detectable in upper and lower  respiratory specimens dur ing the acute phase of infection.  Positive  results are indicative of active infection with SARS-CoV-2.  Clinical  correlation with patient history and other diagnostic information is  necessary to determine patient infection status.  Positive results do  not rule out bacterial infection or co-infection with other viruses. If result is PRESUMPTIVE POSTIVE SARS-CoV-2 nucleic acids MAY BE PRESENT.   A presumptive positive result was obtained on  the submitted specimen  and confirmed on repeat testing.  While 2019 novel coronavirus  (SARS-CoV-2) nucleic acids may be present in the submitted sample  additional confirmatory testing may be necessary for epidemiological  and / or clinical management purposes  to differentiate between  SARS-CoV-2 and other Sarbecovirus currently known to infect humans.  If clinically indicated additional testing with an alternate test  methodology [phone]) is advised. The SARS-CoV-2 RNA is generally  detectable in upper and lower respiratory sp ecimens during the acute  phase of infection. The expected result is Negative. Fact Sheet for Patients:  StrictlyIdeas.no Fact Sheet for Healthcare Providers: BankingDealers.co.za This test is not yet approved or cleared by the [place] FDA and has been authorized for detection and/or diagnosis of SARS-CoV-2 by FDA under an Emergency Use Authorization (EUA).  This EUA will remain in effect (meaning this test can be used) for the duration of the COVID-19 declaration under Section 564(b)(1) of the Act, 21 U.S.C. section 360bbb-3(b)(1), unless the authorization is terminated or revoked sooner. Performed at [facility], [address] [place], [place] [address]     Chemistries  Recent Labs  Lab 07/05/19 1822  NA 136  K 4.1  CL 99  CO2 22  GLUCOSE 149*  BUN 20  CREATININE 1.40*  CALCIUM 10.0  AST 30  ALT 22  ALKPHOS 47  BILITOT 1.1   ------------------------------------------------------------------------------------------------------------------  ------------------------------------------------------------------------------------------------------------------ GFR: Estimated Creatinine Clearance: 28.7 mL/min (A) (by C-G formula based on SCr of 1.4 mg/dL (H)). Liver Function Tests: Recent Labs  Lab 07/05/19 1822  AST 30  ALT 22  ALKPHOS 47  BILITOT 1.1  PROT 7.5  ALBUMIN 4.1    Recent Labs  Lab 07/05/19 1822  LIPASE 39   No results for input(s): AMMONIA in the last 168 hours. Coagulation Profile: No results for input(s): INR, PROTIME in the last 168 hours. Cardiac Enzymes: No results for input(s): CKTOTAL, CKMB, CKMBINDEX, TROPONINI in the last 168 hours. BNP (last 3 results) No results for input(s): PROBNP in the last 8760 hours. HbA1C: No results for input(s): HGBA1C in the last 72 hours. CBG: No results for input(s): GLUCAP in the last 168 hours. Lipid Profile: No results for input(s): CHOL, HDL, LDLCALC, TRIG, CHOLHDL, LDLDIRECT in the last 72 hours. Thyroid Function Tests: No results for input(s): TSH, T4TOTAL, FREET4, T3FREE, THYROIDAB in the last 72 hours. Anemia Panel: No results for input(s): VITAMINB12, FOLATE, FERRITIN, TIBC, IRON, RETICCTPCT in the last 72 hours.  --------------------------------------------------------------------------------------------------------------- Urine analysis:    Component Value Date/Time   COLORURINE YELLOW 07/05/2019 2048   APPEARANCEUR TURBID (A) 07/05/2019 2048   LABSPEC >1.030 (H) 07/05/2019 2048   PHURINE 5.5 07/05/2019 2048   GLUCOSEU NEGATIVE 07/05/2019 2048   GLUCOSEU NEGATIVE 07/28/2018 1620   HGBUR LARGE (A) 07/05/2019 2048   BILIRUBINUR SMALL (A) 07/05/2019 2048   BILIRUBINUR neg 09/24/2015 1055   KETONESUR NEGATIVE 07/05/2019 2048   PROTEINUR 100 (A) 07/05/2019 2048   UROBILINOGEN 1.0 07/28/2018 1620   NITRITE NEGATIVE 07/05/2019 2048   LEUKOCYTESUR NEGATIVE 07/05/2019 2048      Imaging Results:    Ct Abdomen Pelvis Wo Contrast  Result Date: 07/05/2019 CLINICAL DATA:  Abdominal distension, diarrhea, vomiting EXAM: CT ABDOMEN AND PELVIS WITHOUT CONTRAST TECHNIQUE: Multidetector CT imaging of the abdomen and pelvis was performed following the standard protocol without IV contrast. COMPARISON:  04/10/2019 cardiomegaly.  No acute abnormality. FINDINGS: Lower chest: Cardiomegaly.  No acute  abnormality. Hepatobiliary: No focal hepatic abnormality. Gallbladder unremarkable. Pancreas: No focal abnormality or ductal dilatation. Spleen: No focal abnormality.  Normal size. Adrenals/Urinary Tract: No hydronephrosis. Small low-density lesion in the right adrenal gland is stable since prior study, likely adenoma. Urinary bladder decompressed, unremarkable. Stomach/Bowel: There are dilated fluid-filled small bowel loops in the abdomen and pelvis. Distal small bowel is decompressed. Findings compatible with small bowel obstruction. Exact transition not visualized. Findings compatible with moderate to high-grade small bowel obstruction. Stomach is dilated and fluid-filled. Vascular/Lymphatic: Aortic atherosclerosis. No enlarged abdominal or pelvic lymph nodes. Reproductive: Prior hysterectomy.  No adnexal masses. Other: Moderate free fluid in the pelvis and adjacent to the liver. No free air. Musculoskeletal: No acute bony abnormality. IMPRESSION: Dilated fluid-filled stomach and small bowel into the pelvis compatible with small bowel obstruction, likely moderate to high-grade. Distal small bowel is decompressed. Exact transition not visualized. Sigmoid diverticulosis.  No active diverticulitis. Moderate free fluid in the abdomen and pelvis. Aortic atherosclerosis. Electronically Signed   By: Rolm Baptise M.D.   On: 07/05/2019 21:02       Assessment & Plan:    Principal Problem:   SBO (small bowel obstruction) (HCC) Active Problems:   Essential hypertension   Nausea and vomiting  SBO Hydrate with ns iv  NPO except for sips with medication NGT refused by patient  Nausea and vomitting Check 12 lead ekg Check trop I zofran 4mg  iv q6h prn   H/o gastritis Protonix 40mg  iv qday  Pafib STOP Eliquis Heparin Gtt in case needs surgery for SBO  H/o Chronic CHF (EF 45%), Mild MR Hold Spironolactone Cont Lasix 40mg  po qday=> Lasix 20mg  iv qday  Hypertension Cont Lasix as above   Hyperlipidemia Cont Pravastatin 20mg  po qhs  Microscopic hematuria Please make outpatient urology appointment   DVT Prophylaxis-   Heparin - SCDs   AM Labs Ordered, also please review Full Orders  Family Communication: Admission, patients condition and plan of care including tests being ordered have been discussed with the patient  who indicate understanding and agree with the plan and Code Status.  Code Status:  FULL CODE per patient, notified son that patient arrived safely at [facility]  Admission status: Inpatient: Based on patients clinical presentation and evaluation of above clinical data, I have made determination that patient meets Inpatient criteria at this time.  Pt has SBO,  Pt will require NPO and iv fluids and NGT (pt refused), pt has high risk of clinical deterioration . Pt will require > 2 nites stay.   Time spent in minutes : 55 minutes   Jani Gravel M.D on 07/06/2019 at 3:12 AM

## 2019-07-06 NOTE — Progress Notes (Signed)
Multiple unsuccessful attempts (x3) to place an NG tube at [facility], pt refused another attempt for NG tube placement. Currently no nausea or vomiting noted, abdomen soft, MD aware, will continue to monitor.

## 2019-07-06 NOTE — Telephone Encounter (Signed)
The patient went to the hospital and was admitted with SBO.

## 2019-07-06 NOTE — Consult Note (Addendum)
[facility] Surgery Consult Note  KEVIN BROWNELL May 26, 1933  [id number].    Requesting MD:Dr. Venia Minks Chief Complaint: abdominal pain, nausea, vomiting and diarrhea Reason for Consult: SBO  HPI: Patient is an 85 year old female who presented to Urgent care yesterday with abdominal pain nausea, vomiting, and abdominal pain over the last 24 hours.   Pt reports she developed diarrhea nausea and vomiting on 06/28/2019.  She lives alone and had been eating old food in the refrigerator.  She saw Dr. Alain Marion who treated her with tramadol and Lomotil, her symptoms improved with this treatment.  She was eating and having somewhat normal bowel movements.  Sounds like she started feeling bad again Monday evening 07/04/2019.  She awoke on Tuesday, 07/05/2019, with nausea, vomiting, abdominal pain, no diarrhea and no BM for the last 24 hours.  It is now been 48 hours since her last bowel movement.  She was transferred to the Emergency Department from Urgent Care because of abdominal tenderness.  Work-up in the ED last evening shows she was afebrile, occasionally tachycardicm but her vital signs are stable.  CMP shows an elevated glucose of 149 and a creatinine of 1.4.  Remainder the CMP is normal.  WBC 4.8, hemoglobin 15.4, hematocrit 45.3, platelets 190,000.  Covid is negative.  Urine analysis is unremarkable.  CT showed dilated fluid-filled stomach and small bowel into the pelvis compatible with a small bowel obstruction likely moderate to high-grade.  Multiple attempts to place an NG tube failed, and after 3 attempts patient refused any other attempts.  She was transferred to [facility] and admitted to Medicine.  Past medical history is significant for hypertension, hyperlipidemia proximal atrial fibrillation.  History of congestive heart failure, cardiac MRI 04/19/2019 shows severe LV dilatation with severe global systolic dysfunction EF [id number] mild RV dilatation with moderate systolic dysfunction  [id number].  Echo shows EF of 20-25%, 04/10/19.  She has a history of H. pylori gastritis, diverticulosis, history of hysterectomy she denied diarrhea constipation bright red blood per rectum or black stools on the admission H&P.  We are asked to see.     ROS: Review of Systems  Constitutional: Positive for chills and weight loss (215-145 since Novemeber last year). Negative for diaphoresis, fever and malaise/fatigue.  HENT: Negative.   Eyes: Negative.   Respiratory: Positive for shortness of breath (DOE, some occasional orthopnea.) and wheezing.   Cardiovascular: Positive for chest pain, palpitations and orthopnea.  Gastrointestinal: Positive for abdominal pain, diarrhea, nausea and vomiting.  Genitourinary: Negative.   Musculoskeletal:       She has pain in her lower legs with ambulation she has had a knee replacement.  Skin: Negative.   Neurological: Positive for headaches (Occasional headaches.).  Endo/Heme/Allergies: Bruises/bleeds easily (She is on apixaban her last dose was 07/04/2019).  Psychiatric/Behavioral: Negative.        She lives alone is fairly independent still driving to the store.    Family History  Problem Relation Age of Onset  . Cancer Sister        breast  . Cancer Maternal Aunt        breast  . Coronary artery disease Other   . Cancer Other        aunt, niece  . Atrial fibrillation Other     Past Medical History:  Diagnosis Date  . Acute lymphadenitis of arm   . Anemia    iron deficiency  . Atrial fibrillation ([place])   . Breast cancer ([place])   .  Chronic combined systolic and diastolic CHF, NYHA class 2 and ACA/AHA stage C 04/2019   Previously had mildly reduced EF of 45%, reduced to 20-25% as of August 2020 with global hypokinesis and septal-apical akinesis.  . Depression 2009   grief  . Dilated cardiomyopathy ([place]) 04/2019   Presumed to be nonischemic (normal coronary CTA January 2020).  EF by echo 20 to 25% and by CMR 23%.  Severely reduced RV function at  33% EF.Marland Kitchen Severe biatrial enlargement.  Elevated RV P and LVEDP.  Dilated IVC.Marland Kitchen CMRI suggests septal scar stripe.  . Diverticulosis of colon 2004   Dr Deatra Ina  . GERD (gastroesophageal reflux disease)   . Heart murmur   . Helicobacter pylori gastritis   . Hematuria    Dr Terance Hart  . Hyperlipidemia   . Hypertension   . Osteoarthritis, knee   . Skin cancer   . Vitamin B12 deficiency   . Vitamin D deficiency     Past Surgical History:  Procedure Laterality Date  . ABDOMINAL HYSTERECTOMY    . Cardiac MRI  04/19/2019   Severe LV dilation with global hypokinesis.  EF roughly 23%.  Moderate RV dilation with moderately reduced EF of 33%.  Basal septal mid wall enhancement suggest scar, which could be an indication of a worse prognosis for nonischemic cardiomyopathy.   NO LA mass noted  . CARDIOVERSION N/A 09/06/2018   Procedure: CARDIOVERSION;  Surgeon: Fay Records, MD;  Location: [facility] ENDOSCOPY;  Service: Cardiovascular;  Laterality: N/A; --> after initial success with sinus rhythm, the patient went back into A. fib within 2 minutes.  . CORONARY CT ANGIOGRAM  09/14/2018   Coronary Calcium Score 276.  Mild mixed plaque in the left main with minimal stenosis.  Mixed plaque in the ostial LAD, proximal LCx and moderate size OM1, as well as proximal RCA (<50% stenosis throughout.).  Nonobstructive CAD.  Intermediate risk  . JOINT REPLACEMENT    . L groin skin cancer  2011  . MASTECTOMY  09-13-08   left and right  . TOTAL KNEE ARTHROPLASTY  06-04-98  . TOTAL KNEE ARTHROPLASTY Right 05/24/2014   Procedure: RIGHT TOTAL KNEE ARTHROPLASTY;  Surgeon: Newt Minion, MD;  Location: [place];  Service: Orthopedics;  Laterality: Right;  . TRANSTHORACIC ECHOCARDIOGRAM  08/11/2018   Persistent A. fib: Mildly reduced EF of 45 to 50% but diffuse HK.  If A. fib the entire time.  Moderate LA and moderate to severe RA dilation (suggesting longstanding A. fib, and potentially explaining difficulty cardioversion).  No  significant valve disease.  Aortic sclerosis no stenosis.  Unable to assess diastolic function with A. fib  . TRANSTHORACIC ECHOCARDIOGRAM  04/10/2019   Global hypokinesis with EF of 20-25%.  Evidence of elevated filling pressures noted -> GRII DD.  Basal and mid inferior-anteroseptal and apical akinesis.  Mildly reduced RV function with moderate elevated RVP ~53 milli-mercury.  Severe biatrial dilation.  Moderate aortic sclerosis but no stenosis.  Dilated IVC with normal respiratory variability.  Possible LAA mass noted.  Cannot exclude thrombus.    Social History:  reports that she has never smoked. She has never used smokeless tobacco. She reports that she does not drink alcohol or use drugs.  Allergies:  Allergies  Allergen Reactions  . Anastrozole Other (See Comments)    arthralgias  . Aspirin Other (See Comments)    Gi upset   . Ciprofloxacin Itching  . Letrozole Other (See Comments)    leg pain    Medications  Prior to Admission  Medication Sig Dispense Refill  . acetaminophen (TYLENOL) 500 MG tablet Take 500 mg by mouth 2 (two) times daily as needed for moderate pain.     Marland Kitchen apixaban (ELIQUIS) 5 MG TABS tablet Take 1 tablet (5 mg total) by mouth 2 (two) times daily. 42 tablet 0  . [facility] B-12 500 MCG SUBL Place 2 tablets (1,000 mcg total) under the tongue daily. 200 tablet 3  . diphenoxylate-atropine (LOMOTIL) 2.5-0.025 MG tablet Take 1 tablet by mouth 4 (four) times daily as needed for diarrhea or loose stools. 20 tablet 0  . furosemide (LASIX) 40 MG tablet Take 1 tablet (40 mg total) by mouth daily. MAY AN ADDITIONAL DOES IF WEIGHT IS GREATER THAN 3 LBS OR AS DIRECTED 110 tablet 3  . HYDROcodone-acetaminophen (NORCO/VICODIN) 5-325 MG tablet Take 0.5-1 tablets by mouth every 6 (six) hours as needed for severe pain. 20 tablet 0  . Multiple Vitamin (MULTIVITAMIN WITH MINERALS) TABS tablet Take 1 tablet by mouth daily.    . pantoprazole (PROTONIX) 40 MG tablet Take 1 tablet (40 mg total)  by mouth 2 (two) times daily. 60 tablet 5  . potassium chloride SA (KLOR-CON M20) 20 MEQ tablet Take 1 tablet (20 mEq total) by mouth 2 (two) times daily. TAKE AN ADDITIONAL DOSE IT EXTRA FUROSEMIDE IS TAKEN 180 tablet 3  . pravastatin (PRAVACHOL) 20 MG tablet Take 1 tablet (20 mg total) by mouth daily. 90 tablet 3  . spironolactone (ALDACTONE) 25 MG tablet Take 0.5 tablets (12.5 mg total) by mouth daily. 90 tablet 3  . temazepam (RESTORIL) 15 MG capsule Take 1 capsule (15 mg total) by mouth at bedtime as needed for sleep. 30 capsule 3  . triamcinolone cream (KENALOG) 0.5 % APPLY  CREAM TOPICALLY TO AFFECTED AREA 4 TIMES DAILY ON RASH (Patient taking differently: Apply 1 application topically 4 (four) times daily. ) 60 g 0    Blood pressure 116/75, pulse 99, temperature 98.3 F (36.8 C), temperature source Oral, resp. rate 20, height 5\' 2"  (1.575 m), weight 76.6 kg, SpO2 96 %. Physical Exam: Physical Exam Vitals signs reviewed.  Constitutional:      Appearance: She is well-developed and normal weight.  HENT:     Head: Normocephalic and atraumatic.  Eyes:     General: No scleral icterus.    Extraocular Movements: Extraocular movements intact.     Comments: Pupils are equal  Cardiovascular:     Rate and Rhythm: Bradycardia present. Rhythm irregular.     Heart sounds: Normal heart sounds.  Pulmonary:     Effort: Pulmonary effort is normal. No respiratory distress.     Breath sounds: Normal breath sounds. No stridor. No wheezing, rhonchi or rales.  Chest:     Chest wall: No tenderness.  Abdominal:     General: Abdomen is flat. Bowel sounds are decreased. There is no distension.     Palpations: Abdomen is soft.     Tenderness: There is generalized abdominal tenderness.     Hernia: No hernia is present. There is no hernia in the umbilical area, ventral area, left inguinal area, right femoral area, left femoral area or right inguinal area.  Skin:    General: Skin is warm and dry.      Capillary Refill: Capillary refill takes 2 to 3 seconds.  Neurological:     General: No focal deficit present.     Mental Status: She is alert and oriented to person, place, and time.  Cranial Nerves: No cranial nerve deficit.  Psychiatric:        Mood and Affect: Mood normal. Mood is not anxious or depressed.        Behavior: Behavior normal.     Results for orders placed or performed during the hospital encounter of 07/05/19 (from the past 48 hour(s))  Urinalysis, Routine w reflex microscopic     Status: Abnormal   Collection Time: 07/05/19  8:48 PM  Result Value Ref Range   Color, Urine YELLOW YELLOW   APPearance TURBID (A) CLEAR   Specific Gravity, Urine >1.030 (H) 1.005 - 1.030   pH 5.5 5.0 - 8.0   Glucose, UA NEGATIVE NEGATIVE mg/dL   Hgb urine dipstick LARGE (A) NEGATIVE   Bilirubin Urine SMALL (A) NEGATIVE   Ketones, ur NEGATIVE NEGATIVE mg/dL   Protein, ur 100 (A) NEGATIVE mg/dL   Nitrite NEGATIVE NEGATIVE   Leukocytes,Ua NEGATIVE NEGATIVE    Comment: Performed at [facility], [address] [place], [place] [address]  Urinalysis, Microscopic (reflex)     Status: Abnormal   Collection Time: 07/05/19  8:48 PM  Result Value Ref Range   RBC / HPF 6-10 0 - 5 RBC/hpf   WBC, UA 0-5 0 - 5 WBC/hpf   Bacteria, UA MANY (A) NONE SEEN   Squamous Epithelial / LPF 0-5 0 - 5   Mucus PRESENT    Hyaline Casts, UA PRESENT     Comment: Performed at [facility], [address] [place], [place] [address]  SARS Coronavirus 2 by RT PCR (hospital order, performed in [facility] hospital lab) Nasopharyngeal Nasopharyngeal Swab     Status: None   Collection Time: 07/05/19  9:34 PM   Specimen: Nasopharyngeal Swab  Result Value Ref Range   SARS Coronavirus 2 NEGATIVE NEGATIVE    Comment: (NOTE) If result is NEGATIVE SARS-CoV-2 target nucleic acids are NOT DETECTED. The SARS-CoV-2 RNA is generally detectable in upper and lower  respiratory specimens during the  acute phase of infection. The lowest  concentration of SARS-CoV-2 viral copies this assay can detect is 250  copies / mL. A negative result does not preclude SARS-CoV-2 infection  and should not be used as the sole basis for treatment or other  patient management decisions.  A negative result may occur with  improper specimen collection / handling, submission of specimen other  than nasopharyngeal swab, presence of viral mutation(s) within the  areas targeted by this assay, and inadequate number of viral copies  (<250 copies / mL). A negative result must be combined with clinical  observations, patient history, and epidemiological information. If result is POSITIVE SARS-CoV-2 target nucleic acids are DETECTED. The SARS-CoV-2 RNA is generally detectable in upper and lower  respiratory specimens dur ing the acute phase of infection.  Positive  results are indicative of active infection with SARS-CoV-2.  Clinical  correlation with patient history and other diagnostic information is  necessary to determine patient infection status.  Positive results do  not rule out bacterial infection or co-infection with other viruses. If result is PRESUMPTIVE POSTIVE SARS-CoV-2 nucleic acids MAY BE PRESENT.   A presumptive positive result was obtained on the submitted specimen  and confirmed on repeat testing.  While 2019 novel coronavirus  (SARS-CoV-2) nucleic acids may be present in the submitted sample  additional confirmatory testing may be necessary for epidemiological  and / or clinical management purposes  to differentiate between  SARS-CoV-2 and other Sarbecovirus currently  known to infect humans.  If clinically indicated additional testing with an alternate test  methodology [phone]) is advised. The SARS-CoV-2 RNA is generally  detectable in upper and lower respiratory sp ecimens during the acute  phase of infection. The expected result is Negative. Fact Sheet for Patients:   StrictlyIdeas.no Fact Sheet for Healthcare Providers: BankingDealers.co.za This test is not yet approved or cleared by the [place] FDA and has been authorized for detection and/or diagnosis of SARS-CoV-2 by FDA under an Emergency Use Authorization (EUA).  This EUA will remain in effect (meaning this test can be used) for the duration of the COVID-19 declaration under Section 564(b)(1) of the Act, 21 U.S.C. section 360bbb-3(b)(1), unless the authorization is terminated or revoked sooner. Performed at [facility], [address] [place], [place] [address]   Comprehensive metabolic panel     Status: Abnormal   Collection Time: 07/06/19  4:26 AM  Result Value Ref Range   Sodium 139 135 - 145 mmol/L   Potassium 3.9 3.5 - 5.1 mmol/L   Chloride 101 98 - 111 mmol/L   CO2 25 22 - 32 mmol/L   Glucose, Bld 134 (H) 70 - 99 mg/dL   BUN 24 (H) 8 - 23 mg/dL   Creatinine, Ser 1.36 (H) 0.44 - 1.00 mg/dL   Calcium 9.5 8.9 - 10.3 mg/dL   Total Protein 6.8 6.5 - 8.1 g/dL   Albumin 3.7 3.5 - 5.0 g/dL   AST 25 15 - 41 U/L   ALT 20 0 - 44 U/L   Alkaline Phosphatase 40 38 - 126 U/L   Total Bilirubin 1.0 0.3 - 1.2 mg/dL   GFR calc non Af Amer 36 (L) >60 mL/min   GFR calc Af Amer 41 (L) >60 mL/min   Anion gap 13 5 - 15    Comment: Performed at [facility], [place] [address], [place], [place] [address]  CBC     Status: Abnormal   Collection Time: 07/06/19  4:26 AM  Result Value Ref Range   WBC 6.6 4.0 - 10.5 K/uL   RBC 5.18 (H) 3.87 - 5.11 MIL/uL   Hemoglobin 14.8 12.0 - 15.0 g/dL   HCT 45.5 36.0 - 46.0 %   MCV 87.8 80.0 - 100.0 fL   MCH 28.6 26.0 - 34.0 pg   MCHC 32.5 30.0 - 36.0 g/dL   RDW 16.7 (H) 11.5 - 15.5 %   Platelets 185 150 - 400 K/uL   nRBC 0.0 0.0 - 0.2 %    Comment: Performed at [facility], [place] [address] [place], [place] [address]  Troponin I (High Sensitivity)     Status:  Abnormal   Collection Time: 07/06/19  4:26 AM  Result Value Ref Range   Troponin I (High Sensitivity) 36 (H) <18 ng/L    Comment: (NOTE) Elevated high sensitivity troponin I (hsTnI) values and significant  changes across serial measurements may suggest ACS but many other  chronic and acute conditions are known to elevate hsTnI results.  Refer to the "Links" section for chest pain algorithms and additional  guidance. Performed at [facility], [place] [address], [place], [place] [address]    Ct Abdomen Pelvis Wo Contrast  Result Date: 07/05/2019 CLINICAL DATA:  Abdominal distension, diarrhea, vomiting EXAM: CT ABDOMEN AND PELVIS WITHOUT CONTRAST TECHNIQUE: Multidetector CT imaging of the abdomen and pelvis was performed following the standard protocol without IV contrast. COMPARISON:  04/10/2019 cardiomegaly.  No acute  abnormality. FINDINGS: Lower chest: Cardiomegaly.  No acute abnormality. Hepatobiliary: No focal hepatic abnormality. Gallbladder unremarkable. Pancreas: No focal abnormality or ductal dilatation. Spleen: No focal abnormality.  Normal size. Adrenals/Urinary Tract: No hydronephrosis. Small low-density lesion in the right adrenal gland is stable since prior study, likely adenoma. Urinary bladder decompressed, unremarkable. Stomach/Bowel: There are dilated fluid-filled small bowel loops in the abdomen and pelvis. Distal small bowel is decompressed. Findings compatible with small bowel obstruction. Exact transition not visualized. Findings compatible with moderate to high-grade small bowel obstruction. Stomach is dilated and fluid-filled. Vascular/Lymphatic: Aortic atherosclerosis. No enlarged abdominal or pelvic lymph nodes. Reproductive: Prior hysterectomy.  No adnexal masses. Other: Moderate free fluid in the pelvis and adjacent to the liver. No free air. Musculoskeletal: No acute bony abnormality. IMPRESSION: Dilated fluid-filled stomach and small bowel into the pelvis  compatible with small bowel obstruction, likely moderate to high-grade. Distal small bowel is decompressed. Exact transition not visualized. Sigmoid diverticulosis.  No active diverticulitis. Moderate free fluid in the abdomen and pelvis. Aortic atherosclerosis. Electronically Signed   By: Rolm Baptise M.D.   On: 07/05/2019 21:02   . heparin 1,000 Units/hr (07/06/19 0418)      Assessment/Plan CKD CHF/nonischemic cardiomyopathy with EF 20 to 25% Atrial fibrillation/failed cardioversion on apixaban -last dose 07/04/2019 Mild troponin elevation Hypertension Hyperlipidemia Hx H. pylori gastritis Weight loss  Abdominal pain/distention with nausea vomiting Possible small bowel obstruction vs ileus Hx possible enteritis last week   FEN: N.p.o. except sips and chips ID: None DVT: Heparin drip Follow-up: To be determined   Plan: Patient's asymptomatic this a.m.  She denies nausea or vomiting, she is passing some flatus.  She still has not had a bowel movement in 2 days.  Her abdomen is soft nontender and not really very distended.  I will get a plain film on her, and follow with you.  Anticipate will try the small bowel protocol with contrast giving orally if she still has small bowel distention on plain film this a.m. Film shows ongoing SB dilatation, will try SB protocol and give her the contrast orally.  If she has more nausea, or vomiting I would recommend you send her to IR for NG placement.    Earnstine Regal [facility] Surgery 07/06/2019, 7:09 AM Please see Amion for pager number during day hours 7:00am-4:30pm

## 2019-07-06 NOTE — ED Notes (Signed)
[facility] has arrived for transport 

## 2019-07-06 NOTE — Progress Notes (Signed)
Initial Nutrition Assessment  RD working remotely.  DOCUMENTATION CODES:   Obesity unspecified  INTERVENTION:   - RD will follow for diet advancement and supplement as appropriate  NUTRITION DIAGNOSIS:   Inadequate oral intake related to altered GI function as evidenced by NPO status.  GOAL:   Patient will meet greater than or equal to 90% of their needs  MONITOR:   Diet advancement, Labs, Weight trends, I & O's  REASON FOR ASSESSMENT:   Malnutrition Screening Tool    ASSESSMENT:   85 year old female who presented to the ED on 11/03 with abdominal pain, diarrhea, and emesis. PMH of HTN, HLD, atrial fibrillation, CHF, depression, gastritis, diverticulosis. CT showing small bowel obstruction. NGT placement attempted x 3 but unsuccessful. Pt now refusing.   Per General Surgery note, "anticipate will try the small bowel protocol with contrast given orally if she still has small bowel distention on plain film this AM."  Unable to reach pt via phone call to room despite multiple attempts. Pt is NPO at this time. RD will monitor for diet advancement and supplement as appropriate.  Reviewed weight history in chart. Pt with steady decline in weight over the last 10 months to 1 year. Overall, pt has lost 16 kg since 10/12/18. This is a 17.3% weight loss in less than 9 months which is significant for timeframe. It is unclear what has contributed to this weight loss. RD will attempt to obtain detailed diet history from pt upon follow-up given RD unable to get in touch with pt via phone call today.  Suspect pt with some degree of malnutrition given significant weight loss but unable to confirm without diet history or NFPE.  Medications reviewed and include: Lasix, Protonix, Klor-con 20 mEq BID, heparin  Labs reviewed: BUN 24, creatinine 1.36  NUTRITION - FOCUSED PHYSICAL EXAM:  Unable to complete at this time. RD working remotely.  Diet Order:   Diet Order            Diet NPO  time specified Except for: Sips with Meds  Diet effective now              EDUCATION NEEDS:   No education needs have been identified at this time  Skin:  Skin Assessment: Reviewed RN Assessment  Last BM:  07/04/19  Height:   Ht Readings from Last 1 Encounters:  07/06/19 5\' 2"  (1.575 m)    Weight:   Wt Readings from Last 1 Encounters:  07/06/19 76.6 kg    Ideal Body Weight:  50 kg  BMI:  Body mass index is 30.89 kg/m.  Estimated Nutritional Needs:   Kcal:  1550-1750  Protein:  75-90 grams  Fluid:  >/= 1.5 L    Gaynell Face, MS, RD, LDN Inpatient Clinical Dietitian Pager: [phone] Weekend/After Hours: [phone]

## 2019-07-07 ENCOUNTER — Inpatient Hospital Stay (HOSPITAL_COMMUNITY): Payer: Medicare Other

## 2019-07-07 DIAGNOSIS — Z01810 Encounter for preprocedural cardiovascular examination: Secondary | ICD-10-CM

## 2019-07-07 DIAGNOSIS — K56609 Unspecified intestinal obstruction, unspecified as to partial versus complete obstruction: Secondary | ICD-10-CM

## 2019-07-07 DIAGNOSIS — I4821 Permanent atrial fibrillation: Secondary | ICD-10-CM

## 2019-07-07 LAB — CBC
HCT: 46.7 % — ABNORMAL HIGH (ref 36.0–46.0)
Hemoglobin: 14.9 g/dL (ref 12.0–15.0)
MCH: 28.9 pg (ref 26.0–34.0)
MCHC: 31.9 g/dL (ref 30.0–36.0)
MCV: 90.5 fL (ref 80.0–100.0)
Platelets: 185 10*3/uL (ref 150–400)
RBC: 5.16 MIL/uL — ABNORMAL HIGH (ref 3.87–5.11)
RDW: 17 % — ABNORMAL HIGH (ref 11.5–15.5)
WBC: 5.7 10*3/uL (ref 4.0–10.5)
nRBC: 0 % (ref 0.0–0.2)

## 2019-07-07 LAB — APTT
aPTT: 91 seconds — ABNORMAL HIGH (ref 24–36)
aPTT: 86 seconds — ABNORMAL HIGH (ref 24–36)

## 2019-07-07 LAB — HEPARIN LEVEL (UNFRACTIONATED)
Heparin Unfractionated: 1.36 IU/mL — ABNORMAL HIGH (ref 0.30–0.70)
Heparin Unfractionated: 1.2 IU/mL — ABNORMAL HIGH (ref 0.30–0.70)

## 2019-07-07 LAB — BASIC METABOLIC PANEL
Anion gap: 13 (ref 5–15)
BUN: 34 mg/dL — ABNORMAL HIGH (ref 8–23)
CO2: 27 mmol/L (ref 22–32)
Calcium: 9.7 mg/dL (ref 8.9–10.3)
Chloride: 99 mmol/L (ref 98–111)
Creatinine, Ser: 1.77 mg/dL — ABNORMAL HIGH (ref 0.44–1.00)
GFR calc Af Amer: 30 mL/min — ABNORMAL LOW (ref >60)
GFR calc non Af Amer: 26 mL/min — ABNORMAL LOW (ref >60)
Glucose, Bld: 132 mg/dL — ABNORMAL HIGH (ref 70–99)
Potassium: 4.6 mmol/L (ref 3.5–5.1)
Sodium: 139 mmol/L (ref 135–145)

## 2019-07-07 MED ORDER — DIPHENHYDRAMINE HCL 50 MG/ML IJ SOLN
12.5000 mg | Freq: Every evening | INTRAMUSCULAR | Status: DC
  Administered 2019-07-07 – 2019-07-14 (×8): 12.5 mg via INTRAVENOUS
  Filled 2019-07-07 (×10): qty 1

## 2019-07-07 MED ORDER — BISACODYL 10 MG RE SUPP
10.0000 mg | Freq: Once | RECTAL | Status: AC
  Administered 2019-07-07: 10 mg via RECTAL
  Filled 2019-07-07: qty 1

## 2019-07-07 MED ORDER — POTASSIUM CHLORIDE CRYS ER 20 MEQ PO TBCR
20.0000 meq | EXTENDED_RELEASE_TABLET | Freq: Two times a day (BID) | ORAL | Status: DC
  Filled 2019-07-07: qty 1

## 2019-07-07 MED ORDER — PROCHLORPERAZINE EDISYLATE 10 MG/2ML IJ SOLN
10.0000 mg | Freq: Four times a day (QID) | INTRAMUSCULAR | Status: DC | PRN
  Administered 2019-07-07 – 2019-07-08 (×3): 10 mg via INTRAVENOUS
  Filled 2019-07-07 (×3): qty 2

## 2019-07-07 MED ORDER — METOPROLOL TARTRATE 5 MG/5ML IV SOLN
2.5000 mg | Freq: Three times a day (TID) | INTRAVENOUS | Status: DC | PRN
  Administered 2019-07-07 – 2019-07-11 (×3): 2.5 mg via INTRAVENOUS
  Filled 2019-07-07 (×3): qty 5

## 2019-07-07 MED ORDER — SODIUM CHLORIDE 0.9 % IV SOLN
INTRAVENOUS | Status: DC
  Administered 2019-07-07: 17:00:00 via INTRAVENOUS

## 2019-07-07 MED ORDER — POTASSIUM CHLORIDE 10 MEQ/100ML IV SOLN
10.0000 meq | INTRAVENOUS | Status: AC
  Administered 2019-07-07 – 2019-07-08 (×2): 10 meq via INTRAVENOUS
  Filled 2019-07-07 (×2): qty 100

## 2019-07-07 MED ORDER — GLYCERIN (LAXATIVE) 2.1 G RE SUPP
1.0000 | Freq: Once | RECTAL | Status: DC
  Filled 2019-07-07: qty 1

## 2019-07-07 NOTE — Progress Notes (Signed)
ANTICOAGULATION CONSULT NOTE - Follow Up Consult  Pharmacy Consult for Heparin Indication: atrial fibrillation--PTA apixaban  Allergies  Allergen Reactions  . Anastrozole Other (See Comments)    arthralgias  . Aspirin Other (See Comments)    Gi upset   . Ciprofloxacin Itching  . Letrozole Other (See Comments)    leg pain    Patient Measurements: Height: 5\' 2"  (157.5 cm) Weight: 173 lb 15.1 oz (78.9 kg) IBW/kg (Calculated) : 50.1 Heparin Dosing Weight:    Vital Signs: Temp: 97.5 F (36.4 C) (11/05 0428) Temp Source: Oral (11/05 0428) BP: 118/87 (11/05 0428) Pulse Rate: 46 (11/05 0428)  Labs: Recent Labs    07/05/19 1822 07/06/19 0426 07/06/19 1302 07/06/19 2100 07/07/19 0920  HGB 15.4* 14.8  --   --  14.9  HCT 45.3 45.5  --   --  46.7*  PLT 190 185  --   --  185  APTT  --   --  96* 132* 91*  HEPARINUNFRC  --   --  1.70* 1.58* 1.36*  CREATININE 1.40* 1.36*  --   --  1.77*  TROPONINIHS  --  36*  --   --   --     Estimated Creatinine Clearance: 23 mL/min (A) (by C-G formula based on SCr of 1.77 mg/dL (H)).   Assessment: Patient with chronic apixaban for hx of afib.  Last dose apixaban noted 11/2 with unknown time.  PTT ordered with Heparin level until both correlate due to possible drug-lab interaction between oral anticoagulant (rivaroxaban, edoxaban, or apixaban) and anti-Xa level (aka heparin level)  07/07/2019  -aPTT is therapeutic at 91 sec on rate of 900 units/hr.  -Heparin level supratherapeutic at 1.58, elevated d/t patient on Eliquis PTA.  -CBC stable, but Scr has increased from 1.36 to 1.77 -no bleeding reported   Goal of Therapy:  Heparin level 0.3-0.7 units/ml aPTT 66-102 seconds Monitor platelets by anticoagulation protocol: Yes   Plan:  Continue Heparin drip at 900 units/hr Check confirmatory heparin level and aPTT at 1700  Daily heparin level, aPTT, & CBC while on heparin Monitor for s/sx of bleeding  Antino Mayabb 07/07/2019,10:42 AM

## 2019-07-07 NOTE — Consult Note (Signed)
Cardiology Consultation:   Patient ID: [name] [name] MRN: [id number]; DOB: 25-Dec-1932  Admit date: 07/05/2019 Date of Consult: 07/07/2019  Primary Care Provider: Cassandria Anger, MD Primary Cardiologist: Glenetta Hew, MD  Primary Electrophysiologist:  None    Patient Profile:   [name] [name] is a 85 y.o. female with a hx of a fib-persistent since 08/2018, prior HFpEF (EF roughly 45%, along with hypertension hyperlipidemia, CKD-3) but now on last eval 04/2019 EF 20-25%, presumed NICM with normal cors on cardiac CTA 09/2018 who is being seen today for the evaluation of atrial fib at the request of Dr Karleen Hampshire.  History of Present Illness:   Ms. Bourbon with above hx and recent hospitalizations in August 2020 for HF.  EF was found to be decreased to 20-25%.  Mild basal and mid anteroseptal hypo- to akinesis.  There is a 2.3 x 1.43cm mass in the posterior LA of uncler significance. Cannot rule out thrombus, mod TR, RA & LA severely dilated.  Mild to mod MR.  For mass cardiac MRI with no LA mass visualized, EF 23%, mild RV dilatation with moderate systolic dysfunction EF [id number] , Basal LV septal midwall stripe of late gadolinium enhancement. This scar pattern is seen in nonischemic cardiomyopathies and portends a worse prognosis.    Please note on her a fib diagnosed 08/2018 and did have DCCV after anticoagulation and Multaq, DCCV 09/06/18 with initial Sinus beats then back to a fib so plan has been permanent a fib and rate control.  Multaq was stopped.  With recent decline in EF Dr. Ellyn Hack has considered EP consult for a fib.     BP on follow up was soft  Her a fib has been rate controlled and on eliquis for anticoagulation. Spironolactone 12.5 added for CM.   Now admitted 07/05/19 with abd pain and vomiting. Possible SBO vs ileus.  XRAY with SBO and NG has ben placed.  Her Eliquis has been stopped and she is on IV heparin.  Spironolactone stopped and lasix from 40 po to 20 IV.    HR in her a  fib is rapid.    EKG:  The EKG was personally reviewed and demonstrates:  A fib with RVR and LAD, LVH and T wave inversions V6 old.   Telemetry:  Telemetry was personally reviewed and demonstrates:  afib   Na 139, K+ 4.6, BUN 34, Cr 1.77  Troponin 36 14.9 Hct 46.7 plts 185 Pt refused NG,   no chest pain and no SOB, no further vomiting currently - she believes she is passing some gas  Heart Pathway Score:     Past Medical History:  Diagnosis Date   Acute lymphadenitis of arm    Anemia    iron deficiency   Atrial fibrillation (HCC)    Breast cancer (HCC)    Chronic combined systolic and diastolic CHF, NYHA class 2 and ACA/AHA stage C 04/2019   Previously had mildly reduced EF of 45%, reduced to 20-25% as of August 2020 with global hypokinesis and septal-apical akinesis.   Depression 2009   grief   Dilated cardiomyopathy ([place]) 04/2019   Presumed to be nonischemic (normal coronary CTA January 2020).  EF by echo 20 to 25% and by CMR 23%.  Severely reduced RV function at 33% EF.Marland Kitchen Severe biatrial enlargement.  Elevated RV P and LVEDP.  Dilated IVC.Marland Kitchen CMRI suggests septal scar stripe.   Diverticulosis of colon 2004   Dr Deatra Ina   GERD (gastroesophageal reflux disease)  Heart murmur    Helicobacter pylori gastritis    Hematuria    Dr Terance Hart   Hyperlipidemia    Hypertension    Osteoarthritis, knee    Skin cancer    Vitamin B12 deficiency    Vitamin D deficiency     Past Surgical History:  Procedure Laterality Date   ABDOMINAL HYSTERECTOMY     Cardiac MRI  04/19/2019   Severe LV dilation with global hypokinesis.  EF roughly 23%.  Moderate RV dilation with moderately reduced EF of 33%.  Basal septal mid wall enhancement suggest scar, which could be an indication of a worse prognosis for nonischemic cardiomyopathy.   NO LA mass noted   CARDIOVERSION N/A 09/06/2018   Procedure: CARDIOVERSION;  Surgeon: Fay Records, MD;  Location: [facility] ENDOSCOPY;  Service:  Cardiovascular;  Laterality: N/A; --> after initial success with sinus rhythm, the patient went back into A. fib within 2 minutes.   CORONARY CT ANGIOGRAM  09/14/2018   Coronary Calcium Score 276.  Mild mixed plaque in the left main with minimal stenosis.  Mixed plaque in the ostial LAD, proximal LCx and moderate size OM1, as well as proximal RCA (<50% stenosis throughout.).  Nonobstructive CAD.  Intermediate risk   JOINT REPLACEMENT     L groin skin cancer  2011   MASTECTOMY  09-13-08   left and right   TOTAL KNEE ARTHROPLASTY  06-04-98   TOTAL KNEE ARTHROPLASTY Right 05/24/2014   Procedure: RIGHT TOTAL KNEE ARTHROPLASTY;  Surgeon: Newt Minion, MD;  Location: [facility];  Service: Orthopedics;  Laterality: Right;   TRANSTHORACIC ECHOCARDIOGRAM  08/11/2018   Persistent A. fib: Mildly reduced EF of 45 to 50% but diffuse HK.  If A. fib the entire time.  Moderate LA and moderate to severe RA dilation (suggesting longstanding A. fib, and potentially explaining difficulty cardioversion).  No significant valve disease.  Aortic sclerosis no stenosis.  Unable to assess diastolic function with A. fib   TRANSTHORACIC ECHOCARDIOGRAM  04/10/2019   Global hypokinesis with EF of 20-25%.  Evidence of elevated filling pressures noted -> GRII DD.  Basal and mid inferior-anteroseptal and apical akinesis.  Mildly reduced RV function with moderate elevated RVP ~53 milli-mercury.  Severe biatrial dilation.  Moderate aortic sclerosis but no stenosis.  Dilated IVC with normal respiratory variability.  Possible LAA mass noted.  Cannot exclude thrombus.     Home Medications:  Prior to Admission medications   Medication Sig Start Date End Date Taking? Authorizing Provider  acetaminophen (TYLENOL) 500 MG tablet Take 500 mg by mouth 2 (two) times daily as needed for moderate pain.    Yes [provider]  apixaban (ELIQUIS) 5 MG TABS tablet Take 1 tablet (5 mg total) by mouth 2 (two) times daily. 06/17/19  Yes  Leonie Man, MD  [facility] B-12 500 MCG SUBL Place 2 tablets (1,000 mcg total) under the tongue daily. 04/21/18  Yes Plotnikov, Evie Lacks, MD  diphenoxylate-atropine (LOMOTIL) 2.5-0.025 MG tablet Take 1 tablet by mouth 4 (four) times daily as needed for diarrhea or loose stools. 06/29/19  Yes Plotnikov, Evie Lacks, MD  furosemide (LASIX) 40 MG tablet Take 1 tablet (40 mg total) by mouth daily. MAY AN ADDITIONAL DOES IF WEIGHT IS GREATER THAN 3 LBS OR AS DIRECTED 05/20/19  Yes Leonie Man, MD  HYDROcodone-acetaminophen (NORCO/VICODIN) 5-325 MG tablet Take 0.5-1 tablets by mouth every 6 (six) hours as needed for severe pain. 06/29/19 06/28/20 Yes Plotnikov, Evie Lacks, MD  Multiple  Vitamin (MULTIVITAMIN WITH MINERALS) TABS tablet Take 1 tablet by mouth daily.   Yes [provider]  pantoprazole (PROTONIX) 40 MG tablet Take 1 tablet (40 mg total) by mouth 2 (two) times daily. 09/28/18  Yes Plotnikov, Evie Lacks, MD  potassium chloride SA (KLOR-CON M20) 20 MEQ tablet Take 1 tablet (20 mEq total) by mouth 2 (two) times daily. TAKE AN ADDITIONAL DOSE IT EXTRA FUROSEMIDE IS TAKEN 05/20/19  Yes Leonie Man, MD  pravastatin (PRAVACHOL) 20 MG tablet Take 1 tablet (20 mg total) by mouth daily. 07/19/18 07/19/19 Yes Plotnikov, Evie Lacks, MD  spironolactone (ALDACTONE) 25 MG tablet Take 0.5 tablets (12.5 mg total) by mouth daily. 05/20/19 08/18/19 Yes Leonie Man, MD  temazepam (RESTORIL) 15 MG capsule Take 1 capsule (15 mg total) by mouth at bedtime as needed for sleep. 06/15/19  Yes Plotnikov, Evie Lacks, MD  triamcinolone cream (KENALOG) 0.5 % APPLY  CREAM TOPICALLY TO AFFECTED AREA 4 TIMES DAILY ON RASH Patient taking differently: Apply 1 application topically 4 (four) times daily.  06/27/19  Yes Plotnikov, Evie Lacks, MD  losartan (COZAAR) 100 MG tablet Take 1 tablet by mouth once daily 07/06/19   Plotnikov, Evie Lacks, MD    Inpatient Medications: Scheduled Meds:  diphenhydrAMINE  25 mg Oral  QHS   furosemide  20 mg Intravenous Daily   Glycerin (Adult)  1 suppository Rectal Once   mouth rinse  15 mL Mouth Rinse BID   pantoprazole (PROTONIX) IV  40 mg Intravenous QHS   potassium chloride SA  20 mEq Oral BID   Continuous Infusions:  heparin 900 Units/hr (07/07/19 0415)   PRN Meds: acetaminophen **OR** acetaminophen, HYDROmorphone (DILAUDID) injection, metoprolol tartrate, ondansetron (ZOFRAN) IV, prochlorperazine, temazepam  Allergies:    Allergies  Allergen Reactions   Anastrozole Other (See Comments)    arthralgias   Aspirin Other (See Comments)    Gi upset    Ciprofloxacin Itching   Letrozole Other (See Comments)    leg pain    Social History:   Social History   Socioeconomic History   Marital status: Widowed    Spouse name: Not on file   Number of children: 7   Years of education: Not on file   Highest education level: Not on file  Occupational History   Not on file  Social Needs   Financial resource strain: Not hard at all   Food insecurity    Worry: Never true    Inability: Never true   Transportation needs    Medical: No    Non-medical: No  Tobacco Use   Smoking status: Never Smoker   Smokeless tobacco: Never Used  Substance and Sexual Activity   Alcohol use: No   Drug use: No   Sexual activity: Never  Lifestyle   Physical activity    Days per week: 3 days    Minutes per session: 50 min   Stress: Not at all  Relationships   Social connections    Talks on phone: More than three times a week    Gets together: More than three times a week    Attends religious service: More than 4 times per year    Active member of club or organization: Yes    Attends meetings of clubs or organizations: More than 4 times per year    Relationship status: Widowed   Intimate partner violence    Fear of current or ex partner: Not on file    Emotionally abused: Not on  file    Physically abused: Not on file    Forced sexual  activity: Not on file  Other Topics Concern   Not on file  Social History Narrative   Not on file    Family History:    Family History  Problem Relation Age of Onset   Cancer Sister        breast   Cancer Maternal Aunt        breast   Coronary artery disease Other    Cancer Other        aunt, niece   Atrial fibrillation Other      ROS:  Please see the history of present illness.  General:no colds or fevers, no weight changes Skin:no rashes or ulcers HEENT:no blurred vision, no congestion CV:see HPI PUL:see HPI GI:no diarrhea constipation or melena, no indigestion GU:no hematuria, no dysuria MS:no joint pain, no claudication Neuro:no syncope, no lightheadedness Endo:no diabetes, no thyroid disease  All other ROS reviewed and negative.     Physical Exam/Data:   Vitals:   07/06/19 2033 07/06/19 2117 07/07/19 0428 07/07/19 0500  BP: 112/81  118/87   Pulse: (!) 53 78 (!) 46   Resp: 16  16   Temp: 98.2 F (36.8 C)  (!) 97.5 F (36.4 C)   TempSrc: Oral  Oral   SpO2: (!) 88% 93% 91%   Weight:    78.9 kg  Height:        Intake/Output Summary (Last 24 hours) at 07/07/2019 1207 Last data filed at 07/07/2019 0600 Gross per 24 hour  Intake 225.28 ml  Output --  Net 225.28 ml   Last 3 Weights 07/07/2019 07/06/2019 07/05/2019  Weight (lbs) 173 lb 15.1 oz 168 lb 14 oz 171 lb 15.3 oz  Weight (kg) 78.9 kg 76.6 kg 78 kg     Body mass index is 31.81 kg/m.  General:  Frail, weak appearing female, in no acute distress HEENT: normal Lymph: no adenopathy Neck: no JVD Endocrine:  No thryomegaly Vascular: No carotid bruits; pedal pulses 1+ bilaterally without bruits  Cardiac:  Irreg irreg; no murmur gallup rub or click Lungs:  clear to diminished to auscultation bilaterally, no wheezing, rhonchi or rales  Abd: soft, nontender, no hepatomegaly , no BS Ext: no edema Musculoskeletal:  No deformities, BUE and BLE strength normal and equal Skin: warm and dry  Neuro:   Alert and oriented X 3 MAE, follows commands, no focal abnormalities noted Psych:  Normal affect     Relevant CV Studies: ECHO 04/10/19 IMPRESSIONS    1. The left ventricle has severely reduced systolic function, with an ejection fraction of 20-25%. The cavity size was mildly dilated. Left ventricular diastolic function could not be evaluated secondary to atrial fibrillation. Elevated left atrial and  left ventricular end-diastolic pressures Left ventricular diffuse hypokinesis.  2. There is akinesis of the basal and mid infero and anteroseptum, apical septum, apical anterior and apical inferolaterl akinesis.  3. The right ventricle has moderately reduced systolic function. The cavity was severely enlarged. There is no increase in right ventricular wall thickness. Right ventricular systolic pressure is moderately elevated at 29mmhg.  4. Left atrial size was severely dilated.  5. There is a 2.3 x 1.43cm mass in the posterior LA of uncler significance. Cannot rule out thrombus. Consider TEE if clinically indicated.  6. Right atrial size was severely dilated.  7. Mild thickening of the mitral valve leaflet. Mitral valve regurgitation is mild to moderate by color flow Doppler.  8. The aortic valve is tricuspid. Moderate sclerosis of the aortic valve. Aortic valve regurgitation was not assessed by color flow Doppler. Moderate aortic annular calcification noted.  9. Tricuspid valve regurgitation is moderate. 10. The aorta is normal in size and structure. 11. The inferior vena cava was dilated in size with <50% respiratory variability.  FINDINGS  Left Ventricle: The left ventricle has severely reduced systolic function, with an ejection fraction of 20-25%. The cavity size was mildly dilated. There is no increase in left ventricular wall thickness. Left ventricular diastolic function could not be  evaluated secondary to atrial fibrillation. Elevated left atrial and left ventricular end-diastolic  pressures Left ventricular diffuse hypokinesis. There is akinesis of the basal and mid infero and anteroseptum, apical septum, apical anterior and apical  inferolaterl akinesis.  Right Ventricle: The right ventricle has moderately reduced systolic function. The cavity was severely enlarged. There is no increase in right ventricular wall thickness. Right ventricular systolic pressure is moderately elevated.  Left Atrium: Left atrial size was severely dilated. There is a 2.3 x 1.43cm mass in the posterior LA of uncler significance. Cannot rule out thrombus.  Right Atrium: Right atrial size was severely dilated.  Interatrial Septum: No atrial level shunt detected by color flow Doppler.  Pericardium: There is no evidence of pericardial effusion.  Mitral Valve: The mitral valve is normal in structure. Mild thickening of the mitral valve leaflet. Mitral valve regurgitation is mild to moderate by color flow Doppler.  Tricuspid Valve: The tricuspid valve is normal in structure. Tricuspid valve regurgitation is moderate by color flow Doppler.  Aortic Valve: The aortic valve is tricuspid Moderate sclerosis of the aortic valve. Aortic valve regurgitation was not assessed by color flow Doppler. Moderate aortic annular calcification noted.  Pulmonic Valve: The pulmonic valve was normal in structure. Pulmonic valve regurgitation is trivial by color flow Doppler.  Aorta: The aorta is normal in size and structure.  Venous: The inferior vena cava measures 2.72 cm, is dilated in size with less than 50% respiratory variability.      Laboratory Data:  High Sensitivity Troponin:   Recent Labs  Lab 07/06/19 0426  TROPONINIHS 36*     Chemistry Recent Labs  Lab 07/05/19 1822 07/06/19 0426 07/07/19 0920  NA 136 139 139  K 4.1 3.9 4.6  CL 99 101 99  CO2 22 25 27   GLUCOSE 149* 134* 132*  BUN 20 24* 34*  CREATININE 1.40* 1.36* 1.77*  CALCIUM 10.0 9.5 9.7  GFRNONAA 34* 36* 26*   GFRAA 40* 41* 30*  ANIONGAP 15 13 13     Recent Labs  Lab 07/05/19 1822 07/06/19 0426  PROT 7.5 6.8  ALBUMIN 4.1 3.7  AST 30 25  ALT 22 20  ALKPHOS 47 40  BILITOT 1.1 1.0   Hematology Recent Labs  Lab 07/05/19 1822 07/06/19 0426 07/07/19 0920  WBC 4.8 6.6 5.7  RBC 5.21* 5.18* 5.16*  HGB 15.4* 14.8 14.9  HCT 45.3 45.5 46.7*  MCV 86.9 87.8 90.5  MCH 29.6 28.6 28.9  MCHC 34.0 32.5 31.9  RDW 16.5* 16.7* 17.0*  PLT 190 185 185   BNPNo results for input(s): BNP, PROBNP in the last 168 hours.  DDimer No results for input(s): DDIMER in the last 168 hours.   Radiology/Studies:  Ct Abdomen Pelvis Wo Contrast  Result Date: 07/05/2019 CLINICAL DATA:  Abdominal distension, diarrhea, vomiting EXAM: CT ABDOMEN AND PELVIS WITHOUT CONTRAST TECHNIQUE: Multidetector CT imaging of the abdomen and pelvis was performed following the  standard protocol without IV contrast. COMPARISON:  04/10/2019 cardiomegaly.  No acute abnormality. FINDINGS: Lower chest: Cardiomegaly.  No acute abnormality. Hepatobiliary: No focal hepatic abnormality. Gallbladder unremarkable. Pancreas: No focal abnormality or ductal dilatation. Spleen: No focal abnormality.  Normal size. Adrenals/Urinary Tract: No hydronephrosis. Small low-density lesion in the right adrenal gland is stable since prior study, likely adenoma. Urinary bladder decompressed, unremarkable. Stomach/Bowel: There are dilated fluid-filled small bowel loops in the abdomen and pelvis. Distal small bowel is decompressed. Findings compatible with small bowel obstruction. Exact transition not visualized. Findings compatible with moderate to high-grade small bowel obstruction. Stomach is dilated and fluid-filled. Vascular/Lymphatic: Aortic atherosclerosis. No enlarged abdominal or pelvic lymph nodes. Reproductive: Prior hysterectomy.  No adnexal masses. Other: Moderate free fluid in the pelvis and adjacent to the liver. No free air. Musculoskeletal: No acute bony  abnormality. IMPRESSION: Dilated fluid-filled stomach and small bowel into the pelvis compatible with small bowel obstruction, likely moderate to high-grade. Distal small bowel is decompressed. Exact transition not visualized. Sigmoid diverticulosis.  No active diverticulitis. Moderate free fluid in the abdomen and pelvis. Aortic atherosclerosis. Electronically Signed   By: Rolm Baptise M.D.   On: 07/05/2019 21:02   Dg Abd 1 View  Result Date: 07/07/2019 CLINICAL DATA:  NG placement. EXAM: ABDOMEN - 1 VIEW COMPARISON:  Earlier film, same date. FINDINGS: The NG tube is kinked back on itself and likely in the distal esophagus. It should be advanced several cm. Persistent small-bowel obstruction bowel gas pattern. IMPRESSION: The NG tube is kinked back on itself likely in the distal esophagus. It should be advanced several cm. Electronically Signed   By: Marijo Sanes M.D.   On: 07/07/2019 11:28   Dg Abd 1 View  Result Date: 07/06/2019 CLINICAL DATA:  Abdominal pain, nausea/vomiting, SBO EXAM: ABDOMEN - 1 VIEW COMPARISON:  CT abdomen/pelvis dated 07/05/2019 FINDINGS: Multiple dilated loops of small bowel in the left mid abdomen. Degenerative changes of the lumbar spine. Visualized bony pelvis appears intact. IMPRESSION: Multiple dilated loops of small bowel in the left mid abdomen, compatible with small obstruction. Electronically Signed   By: Julian Hy M.D.   On: 07/06/2019 11:32   Dg Abd Portable 1v-small Bowel Obstruction Protocol-initial, 8 Hr Delay  Result Date: 07/07/2019 CLINICAL DATA:  Small-bowel obstruction 8 hours postcontrast administration. EXAM: PORTABLE ABDOMEN - 1 VIEW COMPARISON:  July 06, 2019 FINDINGS: The majority of the oral contrast remains within the small bowel. There are persistently dilated loops of small bowel measuring up to approximately 4.2 cm. Oral contrast is noted in the stomach. IMPRESSION: Persistent at least moderate grade small bowel obstruction. The majority  of the oral contrast remains in the stomach and small bowel. Electronically Signed   By: Constance Holster M.D.   On: 07/07/2019 01:25    Assessment and Plan:   1. Pre-op if surgery needed ok to stop heparin for surgery and pt with patent coronary arteries, she does have cardiomyopathy and fluid volume would need monitoring. Low to moderate risk for surgery.  2. Permanent atrial fib. Rate controlled currently. Use lasix as needed.   Dr Ellyn Hack may refer to EP in future for treatment.  3. NICM with EF 20-25% currently no volume overloaded.  On BB once able to take oral resume and use for rate control.  Hold spironolactone until discharge.  She will need out pt echo before appt with Dr. Ellyn Hack in Dec.   4. SBO, she now refuses NG.  Per surgery to follow  For questions or updates, please contact [place] Please consult [email or web address] for contact info under     Signed, Cecilie Kicks, NP  07/07/2019 12:07 PM

## 2019-07-07 NOTE — Progress Notes (Signed)
[facility] Progress Note     Subjective: CC: sbo Patient very sleepy this AM, just got dilaudid and phenergan. Reports some abdominal pain on L side. Nausea, no emesis. Has not passed flatus or had a BM. RN reports patient seems much weaker today, has not eaten really since Monday. 3 attempts were made at NGT insertion previously without successful placement.   Objective: Vital signs in last 24 hours: Temp:  [97.5 F (36.4 C)-98.2 F (36.8 C)] 97.5 F (36.4 C) (11/05 0428) Pulse Rate:  [46-88] 46 (11/05 0428) Resp:  [16-20] 16 (11/05 0428) BP: (112-128)/(75-88) 118/87 (11/05 0428) SpO2:  [88 %-94 %] 91 % (11/05 0428) Weight:  [78.9 kg] 78.9 kg (11/05 0500) Last BM Date: 07/04/19  Intake/Output from previous day: 11/04 0701 - 11/05 0700 In: 291.3 [P.O.:50; I.V.:241.3] Out: -  Intake/Output this shift: No intake/output data recorded.  PE: Gen:  Lethargic but arousable Card:  Regular rate and rhythm, pedal pulses 2+ BL Pulm:  Normal effort, clear to auscultation bilaterally Abd: Soft, mildly ttp LUQ, no peritonitis, distended, BS hypoactive Skin: warm and dry, no rashes    Lab Results:  Recent Labs    07/05/19 1822 07/06/19 0426  WBC 4.8 6.6  HGB 15.4* 14.8  HCT 45.3 45.5  PLT 190 185   BMET Recent Labs    07/05/19 1822 07/06/19 0426  NA 136 139  K 4.1 3.9  CL 99 101  CO2 22 25  GLUCOSE 149* 134*  BUN 20 24*  CREATININE 1.40* 1.36*  CALCIUM 10.0 9.5   PT/INR No results for input(s): LABPROT, INR in the last 72 hours. CMP     Component Value Date/Time   NA 139 07/06/2019 0426   NA 143 08/30/2018 0825   NA 141 11/05/2015 1429   K 3.9 07/06/2019 0426   K 3.8 11/05/2015 1429   CL 101 07/06/2019 0426   CL 102 05/13/2012 1021   CO2 25 07/06/2019 0426   CO2 28 11/05/2015 1429   GLUCOSE 134 (H) 07/06/2019 0426   GLUCOSE 117 11/05/2015 1429   GLUCOSE 95 05/13/2012 1021   BUN 24 (H) 07/06/2019 0426   BUN 12 08/30/2018 0825   BUN 13.8  11/05/2015 1429   CREATININE 1.36 (H) 07/06/2019 0426   CREATININE 0.9 11/05/2015 1429   CALCIUM 9.5 07/06/2019 0426   CALCIUM 9.8 11/05/2015 1429   PROT 6.8 07/06/2019 0426   PROT 7.2 11/05/2015 1429   ALBUMIN 3.7 07/06/2019 0426   ALBUMIN 3.4 (L) 11/05/2015 1429   AST 25 07/06/2019 0426   AST 13 11/05/2015 1429   ALT 20 07/06/2019 0426   ALT 14 11/05/2015 1429   ALKPHOS 40 07/06/2019 0426   ALKPHOS 68 11/05/2015 1429   BILITOT 1.0 07/06/2019 0426   BILITOT <0.30 11/05/2015 1429   GFRNONAA 36 (L) 07/06/2019 0426   GFRAA 41 (L) 07/06/2019 0426   Lipase     Component Value Date/Time   LIPASE 39 07/05/2019 1822       Studies/Results: Ct Abdomen Pelvis Wo Contrast  Result Date: 07/05/2019 CLINICAL DATA:  Abdominal distension, diarrhea, vomiting EXAM: CT ABDOMEN AND PELVIS WITHOUT CONTRAST TECHNIQUE: Multidetector CT imaging of the abdomen and pelvis was performed following the standard protocol without IV contrast. COMPARISON:  04/10/2019 cardiomegaly.  No acute abnormality. FINDINGS: Lower chest: Cardiomegaly.  No acute abnormality. Hepatobiliary: No focal hepatic abnormality. Gallbladder unremarkable. Pancreas: No focal abnormality or ductal dilatation. Spleen: No focal abnormality.  Normal size. Adrenals/Urinary Tract: No hydronephrosis. Small  low-density lesion in the right adrenal gland is stable since prior study, likely adenoma. Urinary bladder decompressed, unremarkable. Stomach/Bowel: There are dilated fluid-filled small bowel loops in the abdomen and pelvis. Distal small bowel is decompressed. Findings compatible with small bowel obstruction. Exact transition not visualized. Findings compatible with moderate to high-grade small bowel obstruction. Stomach is dilated and fluid-filled. Vascular/Lymphatic: Aortic atherosclerosis. No enlarged abdominal or pelvic lymph nodes. Reproductive: Prior hysterectomy.  No adnexal masses. Other: Moderate free fluid in the pelvis and adjacent  to the liver. No free air. Musculoskeletal: No acute bony abnormality. IMPRESSION: Dilated fluid-filled stomach and small bowel into the pelvis compatible with small bowel obstruction, likely moderate to high-grade. Distal small bowel is decompressed. Exact transition not visualized. Sigmoid diverticulosis.  No active diverticulitis. Moderate free fluid in the abdomen and pelvis. Aortic atherosclerosis. Electronically Signed   By: Rolm Baptise M.D.   On: 07/05/2019 21:02   Dg Abd 1 View  Result Date: 07/06/2019 CLINICAL DATA:  Abdominal pain, nausea/vomiting, SBO EXAM: ABDOMEN - 1 VIEW COMPARISON:  CT abdomen/pelvis dated 07/05/2019 FINDINGS: Multiple dilated loops of small bowel in the left mid abdomen. Degenerative changes of the lumbar spine. Visualized bony pelvis appears intact. IMPRESSION: Multiple dilated loops of small bowel in the left mid abdomen, compatible with small obstruction. Electronically Signed   By: Julian Hy M.D.   On: 07/06/2019 11:32   Dg Abd Portable 1v-small Bowel Obstruction Protocol-initial, 8 Hr Delay  Result Date: 07/07/2019 CLINICAL DATA:  Small-bowel obstruction 8 hours postcontrast administration. EXAM: PORTABLE ABDOMEN - 1 VIEW COMPARISON:  July 06, 2019 FINDINGS: The majority of the oral contrast remains within the small bowel. There are persistently dilated loops of small bowel measuring up to approximately 4.2 cm. Oral contrast is noted in the stomach. IMPRESSION: Persistent at least moderate grade small bowel obstruction. The majority of the oral contrast remains in the stomach and small bowel. Electronically Signed   By: Constance Holster M.D.   On: 07/07/2019 01:25    Anti-infectives: Anti-infectives (From admission, onward)   None       Assessment/Plan CKD CHF/nonischemic cardiomyopathy with EF 20 to 25% Atrial fibrillation/failed cardioversion on apixaban -last dose 07/04/2019 Mild troponin elevation Hypertension Hyperlipidemia Hx H. pylori  gastritis Weight loss  Abdominal pain/distention with nausea vomiting Possible small bowel obstruction vs ileus Hx possible enteritis last week  - NGT was unable to be placed previously so patient started on small bowel protocol with PO contrast - film this AM with contrast in stomach and small bowel - have radiology place NGT today for decompression, may repeat small bowel protocol tomorrow after patient has had time to decompress - some stool noted in left colon on initial imaging, try glycerin suppository today - recommend compazine over phenergan for refractory n/v given age  - may need PICC and TPN given deconditioning and prolonged ileus/sbo  FEN: N.p.o. except sips and chips ID: None DVT: Heparin gtt Follow-up: To be determined  LOS: 2 days    Brigid Re , [facility] 07/07/2019, 8:24 AM Please see Amion for pager number during day hours 7:00am-4:30pm

## 2019-07-07 NOTE — Progress Notes (Signed)
ANTICOAGULATION CONSULT NOTE - Follow Up Consult  Pharmacy Consult for Heparin Indication: atrial fibrillation--PTA apixaban  Allergies  Allergen Reactions  . Anastrozole Other (See Comments)    arthralgias  . Aspirin Other (See Comments)    Gi upset   . Ciprofloxacin Itching  . Letrozole Other (See Comments)    leg pain    Patient Measurements: Height: 5\' 2"  (157.5 cm) Weight: 168 lb 14 oz (76.6 kg) IBW/kg (Calculated) : 50.1 Heparin Dosing Weight:   Vital Signs: Temp: 98.2 F (36.8 C) (11/04 2033) Temp Source: Oral (11/04 2033) BP: 112/81 (11/04 2033) Pulse Rate: 78 (11/04 2117)  Labs: Recent Labs    07/05/19 1822 07/06/19 0426 07/06/19 1302 07/06/19 2100  HGB 15.4* 14.8  --   --   HCT 45.3 45.5  --   --   PLT 190 185  --   --   APTT  --   --  96* 132*  HEPARINUNFRC  --   --  1.70* 1.58*  CREATININE 1.40* 1.36*  --   --   TROPONINIHS  --  36*  --   --     Estimated Creatinine Clearance: 29.5 mL/min (A) (by C-G formula based on SCr of 1.36 mg/dL (H)).   Medications:  Infusions:  . heparin 1,000 Units/hr (07/06/19 1100)    Assessment: Patient with high PTT and high heparin level.  PTT ordered with Heparin level until both correlate due to possible drug-lab interaction between oral anticoagulant (rivaroxaban, edoxaban, or apixaban) and anti-Xa level (aka heparin level).  No heparin issues per RN.  Goal of Therapy:  Heparin level 0.3-0.7 units/ml aPTT 66-102 seconds Monitor platelets by anticoagulation protocol: Yes   Plan:  Decrease heparin to 900 units/hr Recheck levels at 0900  Tyler Deis, [facility] 07/07/2019,12:03 AM

## 2019-07-07 NOTE — Progress Notes (Signed)
RN to room patient upset and wanting NG tube out. She says it is not working and is adamant to take out. She said to RN "if you don't take out now, I will take it out now." RN removed NG per patient request. CCS made aware.

## 2019-07-07 NOTE — Progress Notes (Signed)
ANTICOAGULATION CONSULT NOTE - Follow Up Consult  Pharmacy Consult for Heparin Indication: atrial fibrillation--PTA apixaban  Allergies  Allergen Reactions  . Anastrozole Other (See Comments)    arthralgias  . Aspirin Other (See Comments)    Gi upset   . Ciprofloxacin Itching  . Letrozole Other (See Comments)    leg pain    Patient Measurements: Height: 5\' 2"  (157.5 cm) Weight: 173 lb 15.1 oz (78.9 kg) IBW/kg (Calculated) : 50.1 Heparin Dosing Weight:    Vital Signs: Temp: 98.9 F (37.2 C) (11/05 1224) Temp Source: Axillary (11/05 1224) BP: 124/90 (11/05 1224) Pulse Rate: 74 (11/05 1224)  Labs: Recent Labs    07/05/19 1822 07/06/19 0426  07/06/19 2100 07/07/19 0920 07/07/19 1559  HGB 15.4* 14.8  --   --  14.9  --   HCT 45.3 45.5  --   --  46.7*  --   PLT 190 185  --   --  185  --   APTT  --   --    < > 132* 91* 86*  HEPARINUNFRC  --   --    < > 1.58* 1.36* 1.20*  CREATININE 1.40* 1.36*  --   --  1.77*  --   TROPONINIHS  --  36*  --   --   --   --    < > = values in this interval not displayed.    Estimated Creatinine Clearance: 23 mL/min (A) (by C-G formula based on SCr of 1.77 mg/dL (H)).   Assessment: Patient with chronic apixaban for hx of afib.  Last dose apixaban noted 11/2 with unknown time.  PTT ordered with Heparin level until both correlate due to possible drug-lab interaction between oral anticoagulant (rivaroxaban, edoxaban, or apixaban) and anti-Xa level (aka heparin level)  07/07/2019  -confirmatory aPTT is therapeutic at 86 sec on rate of 900 units/hr.  -Heparin level remains supratherapeutic at 1.2, elevated d/t patient on Eliquis PTA.  -CBC stable, but Scr has increased from 1.36 to 1.77 -no bleeding reported   Goal of Therapy:  Heparin level 0.3-0.7 units/ml aPTT 66-102 seconds Monitor platelets by anticoagulation protocol: Yes   Plan:  Continue Heparin drip at 900 units/hr Daily heparin level, aPTT, & CBC while on heparin Monitor  for s/sx of bleeding  Eudelia Bunch, Pharm.D [phone] 07/07/2019 5:06 PM

## 2019-07-07 NOTE — Progress Notes (Signed)
PROGRESS NOTE    [name] [name]  [id number] DOB: 08/27/1933 DOA: 07/05/2019 PCP: Cassandria Anger, MD   Brief Narrative: 85 year old lady with prior h/o NICM with recent echo showing LVEF of 20 to 25%,persistent atrial fib failed cardioversion , hypertension, diverticulosis, hyperlipidemia, systolic and diastolic heart failure, presents with nausea, vomiting and was found to have SBO. NG TUBE was attempted but couldn't be placed, it was finally placed today fluoro guided but pt removed it. Surgery on board. In view her h/o of NICM, and persistent atrial fibrillation with tachy brady in the last 24 hours, she was started on IV metoprolol and cardiology consulted for recommendations. She is also started on IV heparin, in case she requires surgery.   Assessment & Plan:   Principal Problem:   SBO (small bowel obstruction) (HCC) Active Problems:   Essential hypertension   Nausea and vomiting   SBO:  No BM, not able to pass flatus, abd is still distended.  Surgery on board and appreciate recommendations.  NG tube was placed earlier bu it was removed by the patient.  Gentle hydration while NPO. Repeat abd x ray in am.   H/O NICM:  All HF meds on hold at this time.  She appears euvolemic at this time.    Persistent atrial fibrillation with rates between 40's to 170's.  Prn metoprolol to be used for rates greater than 130/min.  Appreciate cardiology recommendations.  Recent echo showing LVEF Of 20% to 25%.  IV heparin for anticoagulation.     Hypertension:  Well controlled.    Nausea and vomiting : No vomting, but she reports being nauseated.    Mild AKI:  Baseline creatinine around 1.5, currently at 1.7 today. Stop the lasix, get UA and urine electrolytes.  Gently hydrate and repeat renal parameters in am.       DVT prophylaxis: heparin.  Code Status: full code.  Family Communication: none at bedside.  Disposition Plan: pending clinical improvement and further  eval by the surgery.    Consultants:   Surgery.   Cardiology.   Procedures: fluoro guided NG placement.   Antimicrobials: none.   Subjective:   Pt appears nauseated, still very uncomfortable with abdominal distention  Objective: Vitals:   07/06/19 2033 07/06/19 2117 07/07/19 0428 07/07/19 0500  BP: 112/81  118/87   Pulse: (!) 53 78 (!) 46   Resp: 16  16   Temp: 98.2 F (36.8 C)  (!) 97.5 F (36.4 C)   TempSrc: Oral  Oral   SpO2: (!) 88% 93% 91%   Weight:    78.9 kg  Height:        Intake/Output Summary (Last 24 hours) at 07/07/2019 0747 Last data filed at 07/07/2019 0600 Gross per 24 hour  Intake 291.32 ml  Output --  Net 291.32 ml   Filed Weights   07/05/19 2008 07/06/19 0222 07/07/19 0500  Weight: 78 kg 76.6 kg 78.9 kg    Examination:  General exam: in mild distress from abdominal distention.  Respiratory system: diminished at bases.  Cardiovascular system: S1 & S2 heard,irregularly irregular, tachycardic. No JVD,. Gastrointestinal system: Abdomen is distended, soft, tender generalized, no bowel sounds.  Central nervous system: Alert and able to answer all questions appropriately.  Extremities: no pedal edema. . Skin: No rashes, lesions or ulcers Psychiatry: flat affect.     Data Reviewed: I have personally reviewed following labs and imaging studies  CBC: Recent Labs  Lab 07/05/19 1822 07/06/19 0426  WBC  4.8 6.6  HGB 15.4* 14.8  HCT 45.3 45.5  MCV 86.9 87.8  PLT 190 [id number]   Basic Metabolic Panel: Recent Labs  Lab 07/05/19 1822 07/06/19 0426 07/06/19 0440  NA 136 139  --   K 4.1 3.9  --   CL 99 101  --   CO2 22 25  --   GLUCOSE 149* 134*  --   BUN 20 24*  --   CREATININE 1.40* 1.36*  --   CALCIUM 10.0 9.5  --   MG  --   --  1.8   GFR: Estimated Creatinine Clearance: 29.9 mL/min (A) (by C-G formula based on SCr of 1.36 mg/dL (H)). Liver Function Tests: Recent Labs  Lab 07/05/19 1822 07/06/19 0426  AST 30 25  ALT 22 20   ALKPHOS 47 40  BILITOT 1.1 1.0  PROT 7.5 6.8  ALBUMIN 4.1 3.7   Recent Labs  Lab 07/05/19 1822  LIPASE 39   No results for input(s): AMMONIA in the last 168 hours. Coagulation Profile: No results for input(s): INR, PROTIME in the last 168 hours. Cardiac Enzymes: No results for input(s): CKTOTAL, CKMB, CKMBINDEX, TROPONINI in the last 168 hours. BNP (last 3 results) No results for input(s): PROBNP in the last 8760 hours. HbA1C: No results for input(s): HGBA1C in the last 72 hours. CBG: No results for input(s): GLUCAP in the last 168 hours. Lipid Profile: No results for input(s): CHOL, HDL, LDLCALC, TRIG, CHOLHDL, LDLDIRECT in the last 72 hours. Thyroid Function Tests: No results for input(s): TSH, T4TOTAL, FREET4, T3FREE, THYROIDAB in the last 72 hours. Anemia Panel: No results for input(s): VITAMINB12, FOLATE, FERRITIN, TIBC, IRON, RETICCTPCT in the last 72 hours. Sepsis Labs: No results for input(s): PROCALCITON, LATICACIDVEN in the last 168 hours.  Recent Results (from the past 240 hour(s))  SARS Coronavirus 2 by RT PCR (hospital order, performed in [facility] hospital lab) Nasopharyngeal Nasopharyngeal Swab     Status: None   Collection Time: 07/05/19  9:34 PM   Specimen: Nasopharyngeal Swab  Result Value Ref Range Status   SARS Coronavirus 2 NEGATIVE NEGATIVE Final    Comment: (NOTE) If result is NEGATIVE SARS-CoV-2 target nucleic acids are NOT DETECTED. The SARS-CoV-2 RNA is generally detectable in upper and lower  respiratory specimens during the acute phase of infection. The lowest  concentration of SARS-CoV-2 viral copies this assay can detect is 250  copies / mL. A negative result does not preclude SARS-CoV-2 infection  and should not be used as the sole basis for treatment or other  patient management decisions.  A negative result may occur with  improper specimen collection / handling, submission of specimen other  than nasopharyngeal swab, presence of  viral mutation(s) within the  areas targeted by this assay, and inadequate number of viral copies  (<250 copies / mL). A negative result must be combined with clinical  observations, patient history, and epidemiological information. If result is POSITIVE SARS-CoV-2 target nucleic acids are DETECTED. The SARS-CoV-2 RNA is generally detectable in upper and lower  respiratory specimens dur ing the acute phase of infection.  Positive  results are indicative of active infection with SARS-CoV-2.  Clinical  correlation with patient history and other diagnostic information is  necessary to determine patient infection status.  Positive results do  not rule out bacterial infection or co-infection with other viruses. If result is PRESUMPTIVE POSTIVE SARS-CoV-2 nucleic acids MAY BE PRESENT.   A presumptive positive result was obtained on the submitted specimen  and confirmed on repeat testing.  While 2019 novel coronavirus  (SARS-CoV-2) nucleic acids may be present in the submitted sample  additional confirmatory testing may be necessary for epidemiological  and / or clinical management purposes  to differentiate between  SARS-CoV-2 and other Sarbecovirus currently known to infect humans.  If clinically indicated additional testing with an alternate test  methodology [phone]) is advised. The SARS-CoV-2 RNA is generally  detectable in upper and lower respiratory sp ecimens during the acute  phase of infection. The expected result is Negative. Fact Sheet for Patients:  StrictlyIdeas.no Fact Sheet for Healthcare Providers: BankingDealers.co.za This test is not yet approved or cleared by the [place] FDA and has been authorized for detection and/or diagnosis of SARS-CoV-2 by FDA under an Emergency Use Authorization (EUA).  This EUA will remain in effect (meaning this test can be used) for the duration of the COVID-19 declaration under Section  564(b)(1) of the Act, 21 U.S.C. section 360bbb-3(b)(1), unless the authorization is terminated or revoked sooner. Performed at [facility], [address] [place], [place] [address]          Radiology Studies: Ct Abdomen Pelvis Wo Contrast  Result Date: 07/05/2019 CLINICAL DATA:  Abdominal distension, diarrhea, vomiting EXAM: CT ABDOMEN AND PELVIS WITHOUT CONTRAST TECHNIQUE: Multidetector CT imaging of the abdomen and pelvis was performed following the standard protocol without IV contrast. COMPARISON:  04/10/2019 cardiomegaly.  No acute abnormality. FINDINGS: Lower chest: Cardiomegaly.  No acute abnormality. Hepatobiliary: No focal hepatic abnormality. Gallbladder unremarkable. Pancreas: No focal abnormality or ductal dilatation. Spleen: No focal abnormality.  Normal size. Adrenals/Urinary Tract: No hydronephrosis. Small low-density lesion in the right adrenal gland is stable since prior study, likely adenoma. Urinary bladder decompressed, unremarkable. Stomach/Bowel: There are dilated fluid-filled small bowel loops in the abdomen and pelvis. Distal small bowel is decompressed. Findings compatible with small bowel obstruction. Exact transition not visualized. Findings compatible with moderate to high-grade small bowel obstruction. Stomach is dilated and fluid-filled. Vascular/Lymphatic: Aortic atherosclerosis. No enlarged abdominal or pelvic lymph nodes. Reproductive: Prior hysterectomy.  No adnexal masses. Other: Moderate free fluid in the pelvis and adjacent to the liver. No free air. Musculoskeletal: No acute bony abnormality. IMPRESSION: Dilated fluid-filled stomach and small bowel into the pelvis compatible with small bowel obstruction, likely moderate to high-grade. Distal small bowel is decompressed. Exact transition not visualized. Sigmoid diverticulosis.  No active diverticulitis. Moderate free fluid in the abdomen and pelvis. Aortic atherosclerosis. Electronically Signed   By:  Rolm Baptise M.D.   On: 07/05/2019 21:02   Dg Abd 1 View  Result Date: 07/06/2019 CLINICAL DATA:  Abdominal pain, nausea/vomiting, SBO EXAM: ABDOMEN - 1 VIEW COMPARISON:  CT abdomen/pelvis dated 07/05/2019 FINDINGS: Multiple dilated loops of small bowel in the left mid abdomen. Degenerative changes of the lumbar spine. Visualized bony pelvis appears intact. IMPRESSION: Multiple dilated loops of small bowel in the left mid abdomen, compatible with small obstruction. Electronically Signed   By: Julian Hy M.D.   On: 07/06/2019 11:32   Dg Abd Portable 1v-small Bowel Obstruction Protocol-initial, 8 Hr Delay  Result Date: 07/07/2019 CLINICAL DATA:  Small-bowel obstruction 8 hours postcontrast administration. EXAM: PORTABLE ABDOMEN - 1 VIEW COMPARISON:  July 06, 2019 FINDINGS: The majority of the oral contrast remains within the small bowel. There are persistently dilated loops of small bowel measuring up to approximately 4.2 cm. Oral contrast is noted in the stomach. IMPRESSION: Persistent at least moderate grade small bowel obstruction. The majority of  the oral contrast remains in the stomach and small bowel. Electronically Signed   By: Constance Holster M.D.   On: 07/07/2019 01:25        Scheduled Meds:  diphenhydrAMINE  25 mg Oral QHS   furosemide  20 mg Intravenous Daily   mouth rinse  15 mL Mouth Rinse BID   pantoprazole (PROTONIX) IV  40 mg Intravenous QHS   potassium chloride SA  20 mEq Oral BID   Continuous Infusions:  heparin 900 Units/hr (07/07/19 0415)     LOS: 2 days       Hosie Poisson, MD [facility] Pager [phone]  If 7PM-7AM, please contact night-coverage [email or web address] Password [facility] 07/07/2019, 7:47 AM

## 2019-07-07 NOTE — TOC Initial Note (Signed)
Transition of Care [facility]) - Initial/Assessment Note    Patient Details  Name: [name] [name] MRN: [id number] Date of Birth: 09-Aug-1933  Transition of Care [facility]) CM/SW Contact:    Purcell Mouton, RN Phone Number: 07/07/2019, 1:31 PM  Clinical Narrative:                 Pt active with [facility] with HHPT. Will continue to follow for [facility] needs.   Expected Discharge Plan: [place] Barriers to Discharge: No Barriers Identified   Patient Goals and CMS Choice Patient states their goals for this hospitalization and ongoing recovery are:: To get better   Choice offered to / list presented to : Adult Children  Expected Discharge Plan and Services Expected Discharge Plan: [place]   Discharge Planning Services: CM Consult   Living arrangements for the past 2 months: Single Family Home                           HH Arranged: PT [place] Agency: [facility] (formerly [employer]) Date [place]: 07/07/19 Time [place]: 1204 Representative spoke with at [place]: [place] Arrangements/Services Living arrangements for the past 2 months: [place] Lives with:: Adult Children   Do you feel safe going back to the place where you live?: Yes      Need for Family Participation in Patient Care: Yes (Comment) Care giver support system in place?: Yes (comment) Current home services: Home PT    Activities of Daily Living Home Assistive Devices/Equipment: Cane (specify quad or straight) ADL Screening (condition at time of admission) Patient's cognitive ability adequate to safely complete daily activities?: Yes Is the patient deaf or have difficulty hearing?: No Does the patient have difficulty seeing, even when wearing glasses/contacts?: No Does the patient have difficulty concentrating, remembering, or making decisions?: No Patient able to express need for assistance with ADLs?: Yes Does  the patient have difficulty dressing or bathing?: No Independently performs ADLs?: Yes (appropriate for developmental age) Does the patient have difficulty walking or climbing stairs?: No Weakness of Legs: None Weakness of Arms/Hands: None  Permission Sought/Granted Permission sought to share information with : Case Manager                Emotional Assessment Appearance:: Appears stated age     Orientation: : Oriented to Self, Oriented to Place, Oriented to  Time, Oriented to Situation      Admission diagnosis:  SBO (small bowel obstruction) ([place]) [K56.609] Patient Active Problem List   Diagnosis Date Noted  . SBO (small bowel obstruction) ([place]) 07/05/2019  . Diarrhea 06/29/2019  . Dilated cardiomyopathy ([place]) 05/20/2019  . Left atrial mass   . Unintentional weight loss 04/10/2019  . Chest pain 04/09/2019  . Elevated troponin 04/09/2019  . CKD (chronic kidney disease), stage III 04/09/2019  . Chronic combined systolic and diastolic heart failure ([place]) 03/08/2019  . Anosmia 08/18/2018  . Sweats, menopausal 08/18/2018  . Urinary incontinence 08/18/2018  . UTI (urinary tract infection) due to Enterococcus 08/06/2018  . Chest discomfort 08/04/2018  . Permanent atrial fibrillation:  CHA2DS2-VASc Score ([place]) 07/28/2018  . Sciatica associated with disorder of lumbar spine 02/26/2018  . Hip pain, chronic, left 02/26/2018  . Actinic keratosis 07/13/2017  . Wart viral 07/01/2017  . Right leg pain 11/03/2016  . Knee pain, left 07/17/2016  . Bell palsy 10/22/2015  .  Taste sense altered 10/22/2015  . Mouth droop due to facial weakness 10/12/2015  . Sialoadenitis of submandibular gland 10/04/2015  . Fatigue 10/04/2015  . Cold intolerance 04/23/2015  . Headache 12/29/2014  . Total knee replacement status 05/24/2014  . Lymphedema of upper extremity following lymphadenectomy 05/22/2014  . Preop exam for internal medicine 05/22/2014  . Breast cancer ([place]) 03/17/2014  .  Chronic cholecystitis 11/15/2013  . Abnormal gallbladder x-ray 11/08/2013  . Diverticulitis of colon without hemorrhage 11/02/2013  . Hematuria 11/02/2013  . Hypokalemia 11/02/2013  . Obstructive chronic bronchitis with exacerbation ([place]) 09/05/2013  . Mass of right forearm 02/07/2013  . Well adult exam 02/04/2013  . Lipoma of forearm 02/04/2013  . Mass of left side of neck 01/06/2012  . Nausea and vomiting 04/16/2011  . Chills 04/16/2011  . LLQ abdominal pain 04/16/2011  . GERD 06/27/2010  . SKIN CANCER, HX OF 01/31/2010  . CHILLS WITHOUT FEVER 05/04/2009  . Rash and nonspecific skin eruption 11/21/2008  . Herpes zoster 10/04/2008  . PARESTHESIA 10/04/2008  . GRIEF REACTION 08/15/2008  . Depression 08/15/2008  . Osteoarthritis 08/15/2008  . HYPOKALEMIA 12/22/2007  . Weight loss 10/21/2007  . Pain in Soft Tissues of Limb 06/21/2007  . B12 deficiency 03/27/2007  . Vitamin D deficiency 03/27/2007  . Hyperlipidemia with target LDL less than 100 03/27/2007  . ANEMIA-IRON DEFICIENCY 03/27/2007  . Insomnia 03/27/2007  . Essential hypertension 03/27/2007  . BRONCHITIS, ACUTE 03/27/2007  . DIVERTICULOSIS, COLON 03/27/2007   PCP:  Cassandria Anger, MD Pharmacy:   [facility] [phone] - [place], [place] AT [address] [address] Phone: [phone] Fax: [phone]     Social Determinants of Health (SDOH) Interventions    Readmission Risk Interventions Readmission Risk Prevention Plan 04/20/2019  Transportation Screening Complete  HRI or [place] Complete  Social Work Consult for [place] Planning/Counseling Complete  Palliative Care Screening Not Applicable  Medication Review Press photographer) Complete  Some recent data might be hidden

## 2019-07-08 ENCOUNTER — Inpatient Hospital Stay (HOSPITAL_COMMUNITY): Payer: Medicare Other

## 2019-07-08 ENCOUNTER — Inpatient Hospital Stay: Payer: Self-pay

## 2019-07-08 ENCOUNTER — Encounter (HOSPITAL_COMMUNITY): Payer: Self-pay

## 2019-07-08 DIAGNOSIS — E46 Unspecified protein-calorie malnutrition: Secondary | ICD-10-CM | POA: Insufficient documentation

## 2019-07-08 LAB — CBC
HCT: 45.2 % (ref 36.0–46.0)
Hemoglobin: 14.8 g/dL (ref 12.0–15.0)
MCH: 28.9 pg (ref 26.0–34.0)
MCHC: 32.7 g/dL (ref 30.0–36.0)
MCV: 88.3 fL (ref 80.0–100.0)
Platelets: 181 10*3/uL (ref 150–400)
RBC: 5.12 MIL/uL — ABNORMAL HIGH (ref 3.87–5.11)
RDW: 16.8 % — ABNORMAL HIGH (ref 11.5–15.5)
WBC: 5.3 10*3/uL (ref 4.0–10.5)
nRBC: 0 % (ref 0.0–0.2)

## 2019-07-08 LAB — GLUCOSE, CAPILLARY
Glucose-Capillary: 127 mg/dL — ABNORMAL HIGH (ref 70–99)
Glucose-Capillary: 153 mg/dL — ABNORMAL HIGH (ref 70–99)

## 2019-07-08 LAB — BASIC METABOLIC PANEL
Anion gap: 15 (ref 5–15)
BUN: 43 mg/dL — ABNORMAL HIGH (ref 8–23)
CO2: 23 mmol/L (ref 22–32)
Calcium: 9.3 mg/dL (ref 8.9–10.3)
Chloride: 100 mmol/L (ref 98–111)
Creatinine, Ser: 1.66 mg/dL — ABNORMAL HIGH (ref 0.44–1.00)
GFR calc Af Amer: 32 mL/min — ABNORMAL LOW (ref >60)
GFR calc non Af Amer: 28 mL/min — ABNORMAL LOW (ref >60)
Glucose, Bld: 116 mg/dL — ABNORMAL HIGH (ref 70–99)
Potassium: 4.4 mmol/L (ref 3.5–5.1)
Sodium: 138 mmol/L (ref 135–145)

## 2019-07-08 LAB — MAGNESIUM: Magnesium: 2.1 mg/dL (ref 1.7–2.4)

## 2019-07-08 LAB — HEPARIN LEVEL (UNFRACTIONATED): Heparin Unfractionated: 0.96 IU/mL — ABNORMAL HIGH (ref 0.30–0.70)

## 2019-07-08 LAB — APTT: aPTT: 78 seconds — ABNORMAL HIGH (ref 24–36)

## 2019-07-08 LAB — PHOSPHORUS: Phosphorus: 3.3 mg/dL (ref 2.5–4.6)

## 2019-07-08 MED ORDER — LIDOCAINE HCL URETHRAL/MUCOSAL 2 % EX GEL
1.0000 "application " | Freq: Once | CUTANEOUS | Status: DC
  Filled 2019-07-08: qty 5

## 2019-07-08 MED ORDER — CHLORHEXIDINE GLUCONATE CLOTH 2 % EX PADS
6.0000 | MEDICATED_PAD | Freq: Every day | CUTANEOUS | Status: DC
  Administered 2019-07-08 – 2019-07-15 (×7): 6 via TOPICAL

## 2019-07-08 MED ORDER — SODIUM CHLORIDE 0.9% FLUSH
10.0000 mL | INTRAVENOUS | Status: DC | PRN
  Administered 2019-07-11 – 2019-07-14 (×2): 10 mL
  Filled 2019-07-08 (×2): qty 40

## 2019-07-08 MED ORDER — SODIUM CHLORIDE 0.9 % IV SOLN: INTRAVENOUS | Status: DC

## 2019-07-08 MED ORDER — SODIUM CHLORIDE 0.9 % IV SOLN
INTRAVENOUS | Status: AC
  Administered 2019-07-08 – 2019-07-09 (×2): via INTRAVENOUS

## 2019-07-08 MED ORDER — TRAVASOL 10 % IV SOLN
INTRAVENOUS | Status: AC
  Administered 2019-07-08: 18:00:00 via INTRAVENOUS
  Filled 2019-07-08: qty 480

## 2019-07-08 MED ORDER — INSULIN ASPART 100 UNIT/ML ~~LOC~~ SOLN
0.0000 [IU] | SUBCUTANEOUS | Status: DC
  Administered 2019-07-08: 3 [IU] via SUBCUTANEOUS
  Administered 2019-07-08 – 2019-07-10 (×6): 2 [IU] via SUBCUTANEOUS

## 2019-07-08 MED ORDER — LORAZEPAM 2 MG/ML IJ SOLN
0.5000 mg | Freq: Three times a day (TID) | INTRAMUSCULAR | Status: DC | PRN
  Administered 2019-07-08 – 2019-07-11 (×4): 0.5 mg via INTRAVENOUS
  Filled 2019-07-08 (×5): qty 1

## 2019-07-08 MED ORDER — PHENOL 1.4 % MT LIQD
1.0000 | OROMUCOSAL | Status: DC | PRN
  Filled 2019-07-08: qty 177

## 2019-07-08 NOTE — Progress Notes (Signed)
Nutrition Follow-up  DOCUMENTATION CODES:   Severe malnutrition in context of chronic illness  INTERVENTION:   - TPN per Pharmacy  Monitor magnesium, potassium, and phosphorus daily for at least 3 days, MD to replete as needed, as pt is at risk for refeeding syndrome given severe malnutrition, no PO intake since 11/02.  - RD will monitor for diet advancement and supplement as appropriate  NUTRITION DIAGNOSIS:   Severe Malnutrition related to chronic illness (CHF, gastritis) as evidenced by moderate fat depletion, severe muscle depletion, percent weight loss (17.3% weight loss in less than 9 months).  New diagnosis after completion of NFPE  GOAL:   Patient will meet greater than or equal to 90% of their needs  Progressing with initiation of TPN  MONITOR:   Diet advancement, Labs, Weight trends, Skin, I & O's, Other (TPN)  REASON FOR ASSESSMENT:   Consult New TPN/TNA  ASSESSMENT:   85 year old female who presented to the ED on 11/03 with abdominal pain, diarrhea, and emesis. PMH of HTN, HLD, atrial fibrillation, CHF, depression, gastritis, diverticulosis. CT showing small bowel obstruction. NGT placement attempted x 3 but unsuccessful. Pt now refusing.  11/05 - NG tube placed under fluoro, later removed  Noted plan for radiology to place a second NG tube today. Plan is for PICC line placement today and TPN initiation. Possible need for surgery.  Weight stable today compared to admit weight. Will continue to monitor trends.  Plan is for TPN to start at 40 ml/hr to provide 1068 kcal and 48 grams of protein.  Spoke with pt at bedside. Pt very weak and fatigued. Pt states that the last time she had something to eat was on Monday and that she had beans and a pickle. Pt reports not eating since then. Discussed plan to start TPN today with pt.  Pt with steady decline in weight over the last 10 months to 1 year. Overall, pt has lost 16 kg since 10/12/18. This is a 17.3% weight  loss in less than 9 months which is significant for timeframe. Discussed this with pt. Pt reports her UBW as 50 and that she last weighed this 1 year ago. Pt endorses weight loss since that time but is unsure of cause. Pt states she visited different doctors for this but no one was able to find out why she was losing weight. Pt also reports foods not tasting right.  Medications reviewed and include: SSI q 4 hours, Protonix, Klor-con IVF: NS @ 10 ml/hr  Labs reviewed: BUN 43, creatinine 1.66  NUTRITION - FOCUSED PHYSICAL EXAM:    Most Recent Value  Orbital Region  Moderate depletion  Upper Arm Region  No depletion  Thoracic and Lumbar Region  Moderate depletion  Buccal Region  Mild depletion  Temple Region  Severe depletion  Clavicle Bone Region  Severe depletion  Clavicle and Acromion Bone Region  Severe depletion  Scapular Bone Region  Moderate depletion  Dorsal Hand  Mild depletion  Patellar Region  Moderate depletion  Anterior Thigh Region  Moderate depletion  Posterior Calf Region  Moderate depletion  Edema (RD Assessment)  None  Hair  Reviewed  Eyes  Reviewed  Mouth  Reviewed  Skin  Reviewed  Nails  Reviewed       Diet Order:   Diet Order            Diet NPO time specified Except for: Sips with Meds  Diet effective now  EDUCATION NEEDS:   No education needs have been identified at this time  Skin:  Skin Assessment: Reviewed RN Assessment  Last BM:  07/04/19  Height:   Ht Readings from Last 1 Encounters:  07/06/19 5\' 2"  (1.575 m)    Weight:   Wt Readings from Last 1 Encounters:  07/08/19 78.4 kg    Ideal Body Weight:  50 kg  BMI:  Body mass index is 31.61 kg/m.  Estimated Nutritional Needs:   Kcal:  1550-1750  Protein:  75-90 grams  Fluid:  >/= 1.5 L    Gaynell Face, MS, RD, LDN Inpatient Clinical Dietitian Pager: [phone] Weekend/After Hours: [phone]

## 2019-07-08 NOTE — Care Management Important Message (Signed)
Important Message  Patient Details IM Letter given to Rhea Pink SW to present to the Patient Name: [name] [name] MRN: [id number] Date of Birth: November 21, 1932   Medicare Important Message Given:  Yes     Kerin Salen 07/08/2019, 11:08 AM

## 2019-07-08 NOTE — Progress Notes (Signed)
RN asked the patient again if we could place a NG tube. Patient stated "I don't want that thing".

## 2019-07-08 NOTE — Progress Notes (Signed)
ANTICOAGULATION CONSULT NOTE - Follow Up Consult  Pharmacy Consult for Heparin Indication: atrial fibrillation--PTA apixaban  Allergies  Allergen Reactions  . Anastrozole Other (See Comments)    arthralgias  . Aspirin Other (See Comments)    Gi upset   . Ciprofloxacin Itching  . Letrozole Other (See Comments)    leg pain    Patient Measurements: Height: 5\' 2"  (157.5 cm) Weight: 172 lb 13.5 oz (78.4 kg) IBW/kg (Calculated) : 50.1 Heparin Dosing Weight:    Vital Signs: Temp: 97.6 F (36.4 C) (11/06 0427) Temp Source: Oral (11/06 0427) BP: 104/84 (11/06 0427) Pulse Rate: 84 (11/06 0427)  Labs: Recent Labs    07/06/19 0426  07/07/19 0920 07/07/19 1559 07/08/19 0357  HGB 14.8  --  14.9  --  14.8  HCT 45.5  --  46.7*  --  45.2  PLT 185  --  185  --  181  APTT  --    < > 91* 86* 78*  HEPARINUNFRC  --    < > 1.36* 1.20* 0.96*  CREATININE 1.36*  --  1.77*  --  1.66*  TROPONINIHS 36*  --   --   --   --    < > = values in this interval not displayed.    Estimated Creatinine Clearance: 24.5 mL/min (A) (by C-G formula based on SCr of 1.66 mg/dL (H)).   Assessment: Patient with chronic apixaban for hx of afib.  Last dose apixaban noted 11/2 with unknown time.  PTT ordered with Heparin level until both correlate due to possible drug-lab interaction between oral anticoagulant (rivaroxaban, edoxaban, or apixaban) and anti-Xa level (aka heparin level)  07/08/2019  - aPTT is therapeutic at 78 sec on rate of 900 units/hr.  -Heparin level remains supratherapeutic at 0.96, elevated d/t patient on Eliquis PTA.  -CBC stable -no bleeding reported   Goal of Therapy:  Heparin level 0.3-0.7 units/ml aPTT 66-102 seconds Monitor platelets by anticoagulation protocol: Yes   Plan:  Continue Heparin drip at 900 units/hr Daily heparin level, aPTT, & CBC while on heparin Monitor for s/sx of bleeding  Eudelia Bunch, Pharm.D [phone] 07/08/2019 8:49 AM

## 2019-07-08 NOTE — Progress Notes (Signed)
Cardiology Progress Note  Patient ID: [name] [name] MRN: [id number] DOB: 01-Aug-1933 Date of Encounter: 07/08/2019  Primary Cardiologist: Glenetta Hew, MD  Subjective  No complaints this morning.  Telemetry shows atrial fibrillation that is rate controlled.  ROS:  All other ROS reviewed and negative. Pertinent positives noted in the HPI.     Inpatient Medications  Scheduled Meds: . diphenhydrAMINE  12.5 mg Intravenous QPM  . insulin aspart  0-15 Units Subcutaneous Q4H  . lidocaine  1 application Other Once  . mouth rinse  15 mL Mouth Rinse BID  . pantoprazole (PROTONIX) IV  40 mg Intravenous QHS  . potassium chloride SA  20 mEq Oral BID   Continuous Infusions: . sodium chloride 50 mL/hr at 07/07/19 1652  . sodium chloride    . heparin 900 Units/hr (07/07/19 0415)  . TPN ADULT (ION)     PRN Meds: acetaminophen **OR** acetaminophen, HYDROmorphone (DILAUDID) injection, metoprolol tartrate, ondansetron (ZOFRAN) IV, prochlorperazine, temazepam   Vital Signs   Vitals:   07/08/19 0228 07/08/19 0427 07/08/19 0500 07/08/19 1326  BP:  104/84  133/84  Pulse:  84  93  Resp:  20    Temp:  97.6 F (36.4 C)    TempSrc:  Oral    SpO2:  93%  95%  Weight: 82.4 kg  78.4 kg   Height:        Intake/Output Summary (Last 24 hours) at 07/08/2019 1346 Last data filed at 07/08/2019 0602 Gross per 24 hour  Intake 834.48 ml  Output -  Net 834.48 ml   Last 3 Weights 07/08/2019 07/08/2019 07/07/2019  Weight (lbs) 172 lb 13.5 oz 181 lb 10.5 oz 173 lb 15.1 oz  Weight (kg) 78.4 kg 82.4 kg 78.9 kg      Telemetry  Overnight telemetry shows atrial fibrillation with heart rate in the 80s, frequent PVCs, which I personally reviewed.   ECG  The most recent ECG shows atrial fibrillation, which I personally reviewed.   Physical Exam   Vitals:   07/08/19 0228 07/08/19 0427 07/08/19 0500 07/08/19 1326  BP:  104/84  133/84  Pulse:  84  93  Resp:  20    Temp:  97.6 F (36.4 C)    TempSrc:   Oral    SpO2:  93%  95%  Weight: 82.4 kg  78.4 kg   Height:         Intake/Output Summary (Last 24 hours) at 07/08/2019 1346 Last data filed at 07/08/2019 0602 Gross per 24 hour  Intake 834.48 ml  Output -  Net 834.48 ml    Last 3 Weights 07/08/2019 07/08/2019 07/07/2019  Weight (lbs) 172 lb 13.5 oz 181 lb 10.5 oz 173 lb 15.1 oz  Weight (kg) 78.4 kg 82.4 kg 78.9 kg    Body mass index is 31.61 kg/m.  General: Well nourished, well developed, in no acute distress Head: Atraumatic, normal size  Eyes: PEERLA, EOMI  Neck: Supple, no JVD Endocrine: No thryomegaly Cardiac: Irregular rhythm, no murmur rubs or gallops Lungs: Clear to auscultation bilaterally, no wheezing, rhonchi or rales  Abd: Soft, nontender, no hepatomegaly  Ext: No edema, pulses 2+ Musculoskeletal: No deformities, BUE and BLE strength normal and equal Skin: Warm and dry, no rashes   Neuro: Alert and oriented to person, place, time, and situation, CNII-XII grossly intact, no focal deficits  Psych: Normal mood and affect   Labs  High Sensitivity Troponin:   Recent Labs  Lab 07/06/19 0426  TROPONINIHS 36*  Cardiac EnzymesNo results for input(s): TROPONINI in the last 168 hours. No results for input(s): TROPIPOC in the last 168 hours.  Chemistry Recent Labs  Lab 07/05/19 1822 07/06/19 0426 07/07/19 0920 07/08/19 0357  NA 136 139 139 138  K 4.1 3.9 4.6 4.4  CL 99 101 99 100  CO2 22 25 27 23   GLUCOSE 149* 134* 132* 116*  BUN 20 24* 34* 43*  CREATININE 1.40* 1.36* 1.77* 1.66*  CALCIUM 10.0 9.5 9.7 9.3  PROT 7.5 6.8  --   --   ALBUMIN 4.1 3.7  --   --   AST 30 25  --   --   ALT 22 20  --   --   ALKPHOS 47 40  --   --   BILITOT 1.1 1.0  --   --   GFRNONAA 34* 36* 26* 28*  GFRAA 40* 41* 30* 32*  ANIONGAP 15 13 13 15     Hematology Recent Labs  Lab 07/06/19 0426 07/07/19 0920 07/08/19 0357  WBC 6.6 5.7 5.3  RBC 5.18* 5.16* 5.12*  HGB 14.8 14.9 14.8  HCT 45.5 46.7* 45.2  MCV 87.8 90.5 88.3  MCH  28.6 28.9 28.9  MCHC 32.5 31.9 32.7  RDW 16.7* 17.0* 16.8*  PLT 185 185 181   BNPNo results for input(s): BNP, PROBNP in the last 168 hours.  DDimer No results for input(s): DDIMER in the last 168 hours.   Radiology  Dg Abd 1 View  Result Date: 07/07/2019 CLINICAL DATA:  NG placement. EXAM: ABDOMEN - 1 VIEW COMPARISON:  Earlier film, same date. FINDINGS: The NG tube is kinked back on itself and likely in the distal esophagus. It should be advanced several cm. Persistent small-bowel obstruction bowel gas pattern. IMPRESSION: The NG tube is kinked back on itself likely in the distal esophagus. It should be advanced several cm. Electronically Signed   By: Marijo Sanes M.D.   On: 07/07/2019 11:28   Dg Abd Portable 1v  Result Date: 07/08/2019 CLINICAL DATA:  Small-bowel obstruction. EXAM: PORTABLE ABDOMEN - 1 VIEW COMPARISON:  July 07, 2019 FINDINGS: Again identified is a moderate grade small bowel obstruction. The majority of the oral contrast remains in the stomach and small bowel. There is no definite pneumatosis or free air. IMPRESSION: Persistent small bowel obstruction. The majority of the oral contrast remains within the stomach and small bowel. The stomach is distended. The enteric tube is not visualized on this exam. Electronically Signed   By: Constance Holster M.D.   On: 07/08/2019 05:45   Dg Abd Portable 1v-small Bowel Obstruction Protocol-initial, 8 Hr Delay  Result Date: 07/07/2019 CLINICAL DATA:  Small-bowel obstruction 8 hours postcontrast administration. EXAM: PORTABLE ABDOMEN - 1 VIEW COMPARISON:  July 06, 2019 FINDINGS: The majority of the oral contrast remains within the small bowel. There are persistently dilated loops of small bowel measuring up to approximately 4.2 cm. Oral contrast is noted in the stomach. IMPRESSION: Persistent at least moderate grade small bowel obstruction. The majority of the oral contrast remains in the stomach and small bowel. Electronically Signed    By: Constance Holster M.D.   On: 07/07/2019 01:25   [place] Ekg Site Rite  Result Date: 07/08/2019 If Site Rite image not attached, placement could not be confirmed due to current cardiac rhythm.  [place] Ekg Site Rite  Result Date: 07/08/2019 If Site Rite image not attached, placement could not be confirmed due to current cardiac rhythm.   Cardiac Studies  TTE:  20-25%  Patient Profile  KAYLANII FAHERTY is a 85 y.o. female with history of nonischemic cardiomyopathy (ejection fraction 20 to 25% with normal cardiac CTA), persistent atrial fibrillation, CKD 3 who was admitted for small bowel obstruction.  Assessment & Plan  1.  Preoperative assessment: She is euvolemic and optimize from a cardiac standpoint.  Please feel free to proceed with any sort of procedures she may need.  It is okay to hold her heparin at any time.  Please do not resume this earlier than at your discretion or when she is safe and in no threat of bleeding. 2.  Nonischemic cardiomyopathy, ejection fraction 20-25%: Volume status is acceptable.  It is okay to give her 20 mg of IV Lasix as needed.  Would recommend to resume her heart failure medications when she is able to tolerate p.o. medications.  There is no rush to resume her medications.  We would much prefer to get through this acute illness and surgery if needed then to aggressively treat her heart failure. 3.  Persistent atrial fibrillation: She is rate controlled with intermittent doses of metoprolol.  Please resume her home metoprolol when she is able to tolerate p.o.  She is on a heparin drip anticoagulation, and there is no contraindication to stop this for any planned procedure.  It is also okay to hold in anticipation of surgery and to resume when she has no risk of bleeding.  [facility] will sign off.   Medication Recommendations: Medical recommendation as above Other recommendations (labs, testing, etc): None Follow up as an outpatient: Please allow her to keep  her current follow-up appointments with Dr. Ellyn Hack as an outpatient  For questions or updates, please contact [facility] Please consult [email or web address] for contact info under        Signed, [place] T. Audie Box, [place]  07/08/2019 1:46 PM

## 2019-07-08 NOTE — Progress Notes (Signed)
Assessment & Plan:  Abdominal pain/distention with nausea vomiting Small bowel obstruction - likely due to adhesions - AXR this AM with contrast in stomach and small bowel with persistent distension, no contrast in colon - will request radiology to place NG with fluoro today - discussed with patient who agrees to procedure - will order PICC and TPN given deconditioning and prolonged ileus/sbo, possible need for surgery  CKD CHF/nonischemic cardiomyopathy with EF 20 to 25% Atrial fibrillation/failed cardioversion on apixaban-last dose 07/04/2019 Mild troponin elevation Hypertension Hyperlipidemia Hx H. pylori gastritis Weight loss  FEN:NPO except sips and chips ID: None DVT: Heparin gtt Follow-up: To be determined        Armandina Gemma, MD       [facility]       Office: [phone]   Chief Complaint: Nausea, emesis - small bowel obstruction  Subjective: Patient in bed, pleasant, alert.  Complains of nausea and emesis this AM.  Denies BM or flatus.  Objective: Vital signs in last 24 hours: Temp:  [97.6 F (36.4 C)-98.9 F (37.2 C)] 97.6 F (36.4 C) (11/06 0427) Pulse Rate:  [74-88] 84 (11/06 0427) Resp:  [18-20] 20 (11/06 0427) BP: (104-124)/(84-90) 104/84 (11/06 0427) SpO2:  [92 %-95 %] 93 % (11/06 0427) Weight:  [78.4 kg-82.4 kg] 78.4 kg (11/06 0500) Last BM Date: 07/04/19  Intake/Output from previous day: 11/05 0701 - 11/06 0700 In: 834.5 [I.V.:834.5] Out: -  Intake/Output this shift: No intake/output data recorded.  Physical Exam: HEENT - sclerae clear, mucous membranes moist Neck - soft Abdomen - moderate distension, slightly firm, non-tender; BS present; well-healed lower midline incision Neuro - alert & oriented, no focal deficits  Lab Results:  Recent Labs    07/07/19 0920 07/08/19 0357  WBC 5.7 5.3  HGB 14.9 14.8  HCT 46.7* 45.2  PLT 185 181   BMET Recent Labs    07/07/19 0920 07/08/19 0357  NA 139 138  K  4.6 4.4  CL 99 100  CO2 27 23  GLUCOSE 132* 116*  BUN 34* 43*  CREATININE 1.77* 1.66*  CALCIUM 9.7 9.3   PT/INR No results for input(s): LABPROT, INR in the last 72 hours. Comprehensive Metabolic Panel:    Component Value Date/Time   NA 138 07/08/2019 0357   NA 139 07/07/2019 0920   NA 143 08/30/2018 0825   NA 141 11/05/2015 1429   NA 141 08/30/2014 0916   K 4.4 07/08/2019 0357   K 4.6 07/07/2019 0920   K 3.8 11/05/2015 1429   K 4.4 08/30/2014 0916   CL 100 07/08/2019 0357   CL 99 07/07/2019 0920   CL 102 05/13/2012 1021   CO2 23 07/08/2019 0357   CO2 27 07/07/2019 0920   CO2 28 11/05/2015 1429   CO2 28 08/30/2014 0916   BUN 43 (H) 07/08/2019 0357   BUN 34 (H) 07/07/2019 0920   BUN 12 08/30/2018 0825   BUN 13.8 11/05/2015 1429   BUN 12.8 08/30/2014 0916   CREATININE 1.66 (H) 07/08/2019 0357   CREATININE 1.77 (H) 07/07/2019 0920   CREATININE 0.9 11/05/2015 1429   CREATININE 0.9 08/30/2014 0916   GLUCOSE 116 (H) 07/08/2019 0357   GLUCOSE 132 (H) 07/07/2019 0920   GLUCOSE 117 11/05/2015 1429   GLUCOSE 96 08/30/2014 0916   GLUCOSE 95 05/13/2012 1021   CALCIUM 9.3 07/08/2019 0357   CALCIUM 9.7 07/07/2019 0920   CALCIUM 9.8 11/05/2015 1429   CALCIUM 9.9 08/30/2014 0916  AST 25 07/06/2019 0426   AST 30 07/05/2019 1822   AST 13 11/05/2015 1429   AST 15 08/30/2014 0916   ALT 20 07/06/2019 0426   ALT 22 07/05/2019 1822   ALT 14 11/05/2015 1429   ALT 9 08/30/2014 0916   ALKPHOS 40 07/06/2019 0426   ALKPHOS 47 07/05/2019 1822   ALKPHOS 68 11/05/2015 1429   ALKPHOS 79 08/30/2014 0916   BILITOT 1.0 07/06/2019 0426   BILITOT 1.1 07/05/2019 1822   BILITOT <0.30 11/05/2015 1429   BILITOT 0.42 08/30/2014 0916   PROT 6.8 07/06/2019 0426   PROT 7.5 07/05/2019 1822   PROT 7.2 11/05/2015 1429   PROT 7.7 08/30/2014 0916   ALBUMIN 3.7 07/06/2019 0426   ALBUMIN 4.1 07/05/2019 1822   ALBUMIN 3.4 (L) 11/05/2015 1429   ALBUMIN 3.8 08/30/2014 0916    Studies/Results: Dg  Abd 1 View  Result Date: 07/07/2019 CLINICAL DATA:  NG placement. EXAM: ABDOMEN - 1 VIEW COMPARISON:  Earlier film, same date. FINDINGS: The NG tube is kinked back on itself and likely in the distal esophagus. It should be advanced several cm. Persistent small-bowel obstruction bowel gas pattern. IMPRESSION: The NG tube is kinked back on itself likely in the distal esophagus. It should be advanced several cm. Electronically Signed   By: Marijo Sanes M.D.   On: 07/07/2019 11:28   Dg Abd 1 View  Result Date: 07/06/2019 CLINICAL DATA:  Abdominal pain, nausea/vomiting, SBO EXAM: ABDOMEN - 1 VIEW COMPARISON:  CT abdomen/pelvis dated 07/05/2019 FINDINGS: Multiple dilated loops of small bowel in the left mid abdomen. Degenerative changes of the lumbar spine. Visualized bony pelvis appears intact. IMPRESSION: Multiple dilated loops of small bowel in the left mid abdomen, compatible with small obstruction. Electronically Signed   By: Julian Hy M.D.   On: 07/06/2019 11:32   Dg Abd Portable 1v  Result Date: 07/08/2019 CLINICAL DATA:  Small-bowel obstruction. EXAM: PORTABLE ABDOMEN - 1 VIEW COMPARISON:  July 07, 2019 FINDINGS: Again identified is a moderate grade small bowel obstruction. The majority of the oral contrast remains in the stomach and small bowel. There is no definite pneumatosis or free air. IMPRESSION: Persistent small bowel obstruction. The majority of the oral contrast remains within the stomach and small bowel. The stomach is distended. The enteric tube is not visualized on this exam. Electronically Signed   By: Constance Holster M.D.   On: 07/08/2019 05:45   Dg Abd Portable 1v-small Bowel Obstruction Protocol-initial, 8 Hr Delay  Result Date: 07/07/2019 CLINICAL DATA:  Small-bowel obstruction 8 hours postcontrast administration. EXAM: PORTABLE ABDOMEN - 1 VIEW COMPARISON:  July 06, 2019 FINDINGS: The majority of the oral contrast remains within the small bowel. There are  persistently dilated loops of small bowel measuring up to approximately 4.2 cm. Oral contrast is noted in the stomach. IMPRESSION: Persistent at least moderate grade small bowel obstruction. The majority of the oral contrast remains in the stomach and small bowel. Electronically Signed   By: Constance Holster M.D.   On: 07/07/2019 01:25      Armandina Gemma 07/08/2019

## 2019-07-08 NOTE — Progress Notes (Signed)
[facility] NOTE   Pharmacy Consult for TPN Indication: SBP  Patient Measurements: Height: 5\' 2"  (157.5 cm) Weight: 172 lb 13.5 oz (78.4 kg) IBW/kg (Calculated) : 50.1 TPN AdjBW (KG): 56.7 Body mass index is 31.61 kg/m. Usual Weight:   Current Nutrition: NPO  IVF: NS at 50 ml/hr  Central access: PICC ordered 11/6 for TPN TPN start date: 07/08/2019  ASSESSMENT  -SBO likely due to adhesions                                                                                                HPI: CKD, CHF, NICM EF 20-25%, Afib, HTN, HLD, Hx H pylori gastritis, diverticulosis  Significant events: 11/5 NGT placed by IR via fluoro but pt insisted it be removed 11/6 pt agrees to have IR place NGT via fluoro, PICC/TPN ordered   Today:   Glucose: no hx DM, serum glucose < 150 prior to TPN  Electrolytes: hx Afib so goal K> = 4, Mag >=2; on Kdur 20 bid as PTA but home lasix has been held; [employer] WNL  Renal: hx CKD, AKI  Admit SCr 1.4, 1.66 today, SCr 1.2-1.57 04/2019  NICM EF 20-25%, currently not volume overloaded, home lasix held  LFTs: WNL on admit  TGs: on pravachol PTA  Prealbumin:  NUTRITIONAL GOALS                                                                                             RD recs: pending  Custom TPN at goal rate of ml/hr provides: -   g/day protein (   g/L) -   g/day Lipid  (   g/L) -   g/day Dextrose (  %) -   Kcal/day  PLAN                                                                                                                          At 1800 today:  Start TPN at 40 ml/hr. -  Provides 48 g of protein, and  provides ~1068 kCals per day,  TPN at 60 ml/hr would provide 72 gm protein and 1601 Kcals  Electrolytes in TPN: Standard, Cl:Ac ratio 1:1  Plan  to advance as tolerated to the goal rate.  TPN to contain standard multivitamins and trace elements.  Reduce IVF to 10 ml/hr to keep total IVF 50  ml/hr or per MD  Empiric moderate SSI added by CCS  TPN lab panels on Mondays & Thursdays.  F/u daily.  Eudelia Bunch, Pharm.D [phone] 07/08/2019 9:39 AM

## 2019-07-08 NOTE — Progress Notes (Signed)
Peripherally Inserted Central Catheter/Midline Placement  The IV Nurse has discussed with the patient and/or persons authorized to consent for the patient, the purpose of this procedure and the potential benefits and risks involved with this procedure.  The benefits include less needle sticks, lab draws from the catheter, and the patient may be discharged home with the catheter. Risks include, but not limited to, infection, bleeding, blood clot (thrombus formation), and puncture of an artery; nerve damage and irregular heartbeat and possibility to perform a PICC exchange if needed/ordered by physician.  Alternatives to this procedure were also discussed.  Bard Power PICC patient education guide, fact sheet on infection prevention and patient information card has been provided to patient /or left at bedside.    PICC/Midline Placement Documentation  PICC Double Lumen [id number] PICC Right Basilic 39 cm 0 cm (Active)  Indication for Insertion or Continuance of Line Administration of hyperosmolar/irritating solutions (i.e. TPN, Vancomycin, etc.) 07/08/19 1629  Exposed Catheter (cm) 0 cm 07/08/19 1629  Site Assessment Clean;Dry;Intact 07/08/19 1629  Lumen #1 Status Flushed;Saline locked;Blood return noted 07/08/19 1629  Lumen #2 Status Flushed;Saline locked;Blood return noted 07/08/19 1629  Dressing Type Transparent;Securing device 07/08/19 1629  Dressing Status Clean;Dry;Intact;Antimicrobial disc in place 07/08/19 1629  Dressing Intervention New dressing 07/08/19 1629  Dressing Change Due 07/15/19 07/08/19 [place], Trinidi Toppins 07/08/2019, 4:30 PM

## 2019-07-08 NOTE — Progress Notes (Signed)
Patient reassessed after becoming slightly confused and loopy after adminstering ativan for anxiety due to the placement of her NG tube. Patient is easily oriented but sleepy. Patient answers appropriately. MD notified.

## 2019-07-08 NOTE — Progress Notes (Signed)
NG placed, using lidocaine jelly, and 16 Fr NG.  Pt did really well.  We got >1000 cc of green bilious fluid right away.    Will check film now and in AM.

## 2019-07-08 NOTE — Progress Notes (Signed)
PROGRESS NOTE    [name] [name]  [id number] DOB: [birth year] DOA: 07/05/2019 PCP: Cassandria Anger, MD   Brief Narrative: 85 year old lady with prior h/o NICM with recent echo showing LVEF of 20 to 25%,persistent atrial fib failed cardioversion , hypertension, diverticulosis, hyperlipidemia, systolic and diastolic heart failure, presents with nausea, vomiting and was found to have SBO. NG TUBE was attempted but couldn't be placed, it was finally placed via fluoro guided but pt removed it. Surgery on board. In view her h/o of NICM, and persistent atrial fibrillation with tachy brady in the last 24 hours, she was started on IV metoprolol and cardiology consulted for recommendations. She is also started on IV heparin, in case she requires surgery.  After further discussion with family and the patient, surgery was able to put the NG tube and more than 1000 ml of bilious fluid aspirated.    Assessment & Plan:   Principal Problem:   SBO (small bowel obstruction) (HCC) Active Problems:   Essential hypertension   Nausea and vomiting   Protein-calorie malnutrition, severe   Small bowel obstruction Patient continues to have small bowel obstruction or repeated films.  Abdomen is distended patient is not able to pass flatus and no bowel movement.  Patient continues to be nauseated without any vomiting.  Another attempt at NG tube placement today was successful and and greater than 1000 cc of bilious fluid aspirated. Repeat abdominal film underway. Appreciate surgery recommendations.  History of nonischemic cardiomyopathy She appears euvolemic at this time.  Patient denies any chest pain or shortness of breath Cardiology consulted recommended to start oral metoprolol and Lasix when her bowel obstruction improves.   Persistent atrial fibrillation with rates between 40's to 170's.  Prn metoprolol to be used for rates greater than 130/min.  Appreciate cardiology recommendations.  Recent echo  showing LVEF Of 20% to 25%.  IV heparin for anticoagulation.     Hypertension:  Well-controlled   Nausea and vomiting : Patient continues to be nauseated,.  As needed Zofran ordered.   Mild AKI:  Baseline creatinine around 1.5, Creatinine at 1.6 today.   Nutrition  PICC line ordered and TPN to be started later today.  DVT prophylaxis: heparin.  Code Status: full code.  Family Communication: none at bedside.  Discussed with the son and daughter over the phone and son at bedside. Disposition Plan: pending improvement in SBO   Consultants:   Surgery.   Cardiology.   Procedures: fluoro guided NG placement.   Antimicrobials: none.   Subjective:   Patient continues to have abdominal distention, very nauseated, no flatulence and no bowel movement in the last 24 hours.  Objective: Vitals:   07/08/19 0228 07/08/19 0427 07/08/19 0500 07/08/19 1326  BP:  104/84  133/84  Pulse:  84  93  Resp:  20    Temp:  97.6 F (36.4 C)    TempSrc:  Oral    SpO2:  93%  95%  Weight: 82.4 kg  78.4 kg   Height:        Intake/Output Summary (Last 24 hours) at 07/08/2019 1356 Last data filed at 07/08/2019 0602 Gross per 24 hour  Intake 834.48 ml  Output --  Net 834.48 ml   Filed Weights   07/07/19 0500 07/08/19 0228 07/08/19 0500  Weight: 78.9 kg 82.4 kg 78.4 kg    Examination:  General exam: Mild distress from abdominal distention and nausea Respiratory system: Diminished air entry at bases, no wheezing or rhonchi Cardiovascular  system: S1-S2 heard, irregular, no JVD Gastrointestinal system: Abdomen is distended, soft, bowel sounds are heard, nontender Central nervous system: Alert and able to answer all questions appropriately Extremities: No leg edema Skin: No rashes Psychiatry: Mood is appropriate today    Data Reviewed: I have personally reviewed following labs and imaging studies  CBC: Recent Labs  Lab 07/05/19 1822 07/06/19 0426 07/07/19 0920  07/08/19 0357  WBC 4.8 6.6 5.7 5.3  HGB 15.4* 14.8 14.9 14.8  HCT 45.3 45.5 46.7* 45.2  MCV 86.9 87.8 90.5 88.3  PLT 190 185 185 0000000   Basic Metabolic Panel: Recent Labs  Lab 07/05/19 1822 07/06/19 0426 07/06/19 0440 07/07/19 0920 07/08/19 0357  NA 136 139  --  139 138  K 4.1 3.9  --  4.6 4.4  CL 99 101  --  99 100  CO2 22 25  --  27 23  GLUCOSE 149* 134*  --  132* 116*  BUN 20 24*  --  34* 43*  CREATININE 1.40* 1.36*  --  1.77* 1.66*  CALCIUM 10.0 9.5  --  9.7 9.3  MG  --   --  1.8  --  2.1  PHOS  --   --   --   --  3.3   GFR: Estimated Creatinine Clearance: 24.5 mL/min (A) (by C-G formula based on SCr of 1.66 mg/dL (H)). Liver Function Tests: Recent Labs  Lab 07/05/19 1822 07/06/19 0426  AST 30 25  ALT 22 20  ALKPHOS 47 40  BILITOT 1.1 1.0  PROT 7.5 6.8  ALBUMIN 4.1 3.7   Recent Labs  Lab 07/05/19 1822  LIPASE 39   No results for input(s): AMMONIA in the last 168 hours. Coagulation Profile: No results for input(s): INR, PROTIME in the last 168 hours. Cardiac Enzymes: No results for input(s): CKTOTAL, CKMB, CKMBINDEX, TROPONINI in the last 168 hours. BNP (last 3 results) No results for input(s): PROBNP in the last 8760 hours. HbA1C: No results for input(s): HGBA1C in the last 72 hours. CBG: No results for input(s): GLUCAP in the last 168 hours. Lipid Profile: No results for input(s): CHOL, HDL, LDLCALC, TRIG, CHOLHDL, LDLDIRECT in the last 72 hours. Thyroid Function Tests: No results for input(s): TSH, T4TOTAL, FREET4, T3FREE, THYROIDAB in the last 72 hours. Anemia Panel: No results for input(s): VITAMINB12, FOLATE, FERRITIN, TIBC, IRON, RETICCTPCT in the last 72 hours. Sepsis Labs: No results for input(s): PROCALCITON, LATICACIDVEN in the last 168 hours.  Recent Results (from the past 240 hour(s))  SARS Coronavirus 2 by RT PCR (hospital order, performed in [facility] hospital lab) Nasopharyngeal Nasopharyngeal Swab     Status: None   Collection  Time: 07/05/19  9:34 PM   Specimen: Nasopharyngeal Swab  Result Value Ref Range Status   SARS Coronavirus 2 NEGATIVE NEGATIVE Final    Comment: (NOTE) If result is NEGATIVE SARS-CoV-2 target nucleic acids are NOT DETECTED. The SARS-CoV-2 RNA is generally detectable in upper and lower  respiratory specimens during the acute phase of infection. The lowest  concentration of SARS-CoV-2 viral copies this assay can detect is 250  copies / mL. A negative result does not preclude SARS-CoV-2 infection  and should not be used as the sole basis for treatment or other  patient management decisions.  A negative result may occur with  improper specimen collection / handling, submission of specimen other  than nasopharyngeal swab, presence of viral mutation(s) within the  areas targeted by this assay, and inadequate number of viral  copies  (<250 copies / mL). A negative result must be combined with clinical  observations, patient history, and epidemiological information. If result is POSITIVE SARS-CoV-2 target nucleic acids are DETECTED. The SARS-CoV-2 RNA is generally detectable in upper and lower  respiratory specimens dur ing the acute phase of infection.  Positive  results are indicative of active infection with SARS-CoV-2.  Clinical  correlation with patient history and other diagnostic information is  necessary to determine patient infection status.  Positive results do  not rule out bacterial infection or co-infection with other viruses. If result is PRESUMPTIVE POSTIVE SARS-CoV-2 nucleic acids MAY BE PRESENT.   A presumptive positive result was obtained on the submitted specimen  and confirmed on repeat testing.  While 2019 novel coronavirus  (SARS-CoV-2) nucleic acids may be present in the submitted sample  additional confirmatory testing may be necessary for epidemiological  and / or clinical management purposes  to differentiate between  SARS-CoV-2 and other Sarbecovirus currently known  to infect humans.  If clinically indicated additional testing with an alternate test  methodology [phone]) is advised. The SARS-CoV-2 RNA is generally  detectable in upper and lower respiratory sp ecimens during the acute  phase of infection. The expected result is Negative. Fact Sheet for Patients:  StrictlyIdeas.no Fact Sheet for Healthcare Providers: BankingDealers.co.za This test is not yet approved or cleared by the [place] FDA and has been authorized for detection and/or diagnosis of SARS-CoV-2 by FDA under an Emergency Use Authorization (EUA).  This EUA will remain in effect (meaning this test can be used) for the duration of the COVID-19 declaration under Section 564(b)(1) of the Act, 21 U.S.C. section 360bbb-3(b)(1), unless the authorization is terminated or revoked sooner. Performed at [facility], [address], [place], [place] [address]          Radiology Studies: Dg Abd 1 View  Result Date: 07/07/2019 CLINICAL DATA:  NG placement. EXAM: ABDOMEN - 1 VIEW COMPARISON:  Earlier film, same date. FINDINGS: The NG tube is kinked back on itself and likely in the distal esophagus. It should be advanced several cm. Persistent small-bowel obstruction bowel gas pattern. IMPRESSION: The NG tube is kinked back on itself likely in the distal esophagus. It should be advanced several cm. Electronically Signed   By: Marijo Sanes M.D.   On: 07/07/2019 11:28   Dg Abd Portable 1v  Result Date: 07/08/2019 CLINICAL DATA:  Small-bowel obstruction. EXAM: PORTABLE ABDOMEN - 1 VIEW COMPARISON:  July 07, 2019 FINDINGS: Again identified is a moderate grade small bowel obstruction. The majority of the oral contrast remains in the stomach and small bowel. There is no definite pneumatosis or free air. IMPRESSION: Persistent small bowel obstruction. The majority of the oral contrast remains within the stomach and small bowel. The  stomach is distended. The enteric tube is not visualized on this exam. Electronically Signed   By: Constance Holster M.D.   On: 07/08/2019 05:45   Dg Abd Portable 1v-small Bowel Obstruction Protocol-initial, 8 Hr Delay  Result Date: 07/07/2019 CLINICAL DATA:  Small-bowel obstruction 8 hours postcontrast administration. EXAM: PORTABLE ABDOMEN - 1 VIEW COMPARISON:  July 06, 2019 FINDINGS: The majority of the oral contrast remains within the small bowel. There are persistently dilated loops of small bowel measuring up to approximately 4.2 cm. Oral contrast is noted in the stomach. IMPRESSION: Persistent at least moderate grade small bowel obstruction. The majority of the oral contrast remains in the stomach and small bowel. Electronically Signed  By: Constance Holster M.D.   On: 07/07/2019 01:25   [place] Ekg Site Rite  Result Date: 07/08/2019 If Site Rite image not attached, placement could not be confirmed due to current cardiac rhythm.  [place] Ekg Site Rite  Result Date: 07/08/2019 If Site Rite image not attached, placement could not be confirmed due to current cardiac rhythm.       Scheduled Meds:  diphenhydrAMINE  12.5 mg Intravenous QPM   insulin aspart  0-15 Units Subcutaneous Q4H   lidocaine  1 application Other Once   mouth rinse  15 mL Mouth Rinse BID   pantoprazole (PROTONIX) IV  40 mg Intravenous QHS   potassium chloride SA  20 mEq Oral BID   Continuous Infusions:  sodium chloride 50 mL/hr at 07/07/19 1652   sodium chloride     heparin 900 Units/hr (07/07/19 0415)   TPN ADULT (ION)       LOS: 3 days       Hosie Poisson, MD [facility] Pager [phone]  If 7PM-7AM, please contact night-coverage [email or web address] Password [email or web address] 07/08/2019, 1:56 PM

## 2019-07-09 ENCOUNTER — Inpatient Hospital Stay (HOSPITAL_COMMUNITY): Payer: Medicare Other

## 2019-07-09 DIAGNOSIS — Z7189 Other specified counseling: Secondary | ICD-10-CM

## 2019-07-09 DIAGNOSIS — Z515 Encounter for palliative care: Secondary | ICD-10-CM

## 2019-07-09 DIAGNOSIS — E43 Unspecified severe protein-calorie malnutrition: Secondary | ICD-10-CM

## 2019-07-09 LAB — CBC
HCT: 41.7 % (ref 36.0–46.0)
Hemoglobin: 13.4 g/dL (ref 12.0–15.0)
MCH: 28.6 pg (ref 26.0–34.0)
MCHC: 32.1 g/dL (ref 30.0–36.0)
MCV: 88.9 fL (ref 80.0–100.0)
Platelets: 175 10*3/uL (ref 150–400)
RBC: 4.69 MIL/uL (ref 3.87–5.11)
RDW: 16.9 % — ABNORMAL HIGH (ref 11.5–15.5)
WBC: 4.5 10*3/uL (ref 4.0–10.5)
nRBC: 0 % (ref 0.0–0.2)

## 2019-07-09 LAB — PHOSPHORUS: Phosphorus: 1.8 mg/dL — ABNORMAL LOW (ref 2.5–4.6)

## 2019-07-09 LAB — COMPREHENSIVE METABOLIC PANEL
ALT: 20 U/L (ref 0–44)
AST: 23 U/L (ref 15–41)
Albumin: 3.3 g/dL — ABNORMAL LOW (ref 3.5–5.0)
Alkaline Phosphatase: 36 U/L — ABNORMAL LOW (ref 38–126)
Anion gap: 10 (ref 5–15)
BUN: 38 mg/dL — ABNORMAL HIGH (ref 8–23)
CO2: 29 mmol/L (ref 22–32)
Calcium: 9.2 mg/dL (ref 8.9–10.3)
Chloride: 102 mmol/L (ref 98–111)
Creatinine, Ser: 1.15 mg/dL — ABNORMAL HIGH (ref 0.44–1.00)
GFR calc Af Amer: 51 mL/min — ABNORMAL LOW (ref >60)
GFR calc non Af Amer: 44 mL/min — ABNORMAL LOW (ref >60)
Glucose, Bld: 128 mg/dL — ABNORMAL HIGH (ref 70–99)
Potassium: 3.8 mmol/L (ref 3.5–5.1)
Sodium: 141 mmol/L (ref 135–145)
Total Bilirubin: 0.7 mg/dL (ref 0.3–1.2)
Total Protein: 6.3 g/dL — ABNORMAL LOW (ref 6.5–8.1)

## 2019-07-09 LAB — DIFFERENTIAL
Abs Immature Granulocytes: 0.01 10*3/uL (ref 0.00–0.07)
Basophils Absolute: 0 10*3/uL (ref 0.0–0.1)
Basophils Relative: 0 %
Eosinophils Absolute: 0.1 10*3/uL (ref 0.0–0.5)
Eosinophils Relative: 2 %
Immature Granulocytes: 0 %
Lymphocytes Relative: 32 %
Lymphs Abs: 1.4 10*3/uL (ref 0.7–4.0)
Monocytes Absolute: 0.9 10*3/uL (ref 0.1–1.0)
Monocytes Relative: 20 %
Neutro Abs: 2.1 10*3/uL (ref 1.7–7.7)
Neutrophils Relative %: 46 %

## 2019-07-09 LAB — TRIGLYCERIDES: Triglycerides: 88 mg/dL (ref <150)

## 2019-07-09 LAB — GLUCOSE, CAPILLARY
Glucose-Capillary: 129 mg/dL — ABNORMAL HIGH (ref 70–99)
Glucose-Capillary: 116 mg/dL — ABNORMAL HIGH (ref 70–99)
Glucose-Capillary: 140 mg/dL — ABNORMAL HIGH (ref 70–99)
Glucose-Capillary: 129 mg/dL — ABNORMAL HIGH (ref 70–99)
Glucose-Capillary: 114 mg/dL — ABNORMAL HIGH (ref 70–99)

## 2019-07-09 LAB — MAGNESIUM: Magnesium: 2.3 mg/dL (ref 1.7–2.4)

## 2019-07-09 LAB — HEPARIN LEVEL (UNFRACTIONATED): Heparin Unfractionated: 0.8 IU/mL — ABNORMAL HIGH (ref 0.30–0.70)

## 2019-07-09 LAB — APTT: aPTT: 72 seconds — ABNORMAL HIGH (ref 24–36)

## 2019-07-09 LAB — PREALBUMIN: Prealbumin: 20.1 mg/dL (ref 18–38)

## 2019-07-09 MED ORDER — HYDRALAZINE HCL 20 MG/ML IJ SOLN
2.0000 mg | Freq: Four times a day (QID) | INTRAMUSCULAR | Status: DC | PRN
  Administered 2019-07-09: 15:00:00 2 mg via INTRAVENOUS
  Filled 2019-07-09: qty 1

## 2019-07-09 MED ORDER — TRAVASOL 10 % IV SOLN
INTRAVENOUS | Status: AC
  Administered 2019-07-09: 17:00:00 via INTRAVENOUS
  Filled 2019-07-09: qty 858

## 2019-07-09 MED ORDER — POTASSIUM PHOSPHATES 15 MMOLE/5ML IV SOLN
20.0000 mmol | Freq: Once | INTRAVENOUS | Status: AC
  Administered 2019-07-09: 20 mmol via INTRAVENOUS
  Filled 2019-07-09: qty 6.67

## 2019-07-09 NOTE — Progress Notes (Signed)
Patient's heart rate has been very erratic throughout shift. Elevating when up and active but non-sustained. Continue to monitor. Eulas Post, RN

## 2019-07-09 NOTE — Progress Notes (Signed)
Pt called out c/o "not being able to breath". Resp irregular but unlabored. O2 sat 99% on RA. VS taken and BP elevated as well as heart rate. Temp 99.7 rectal. Pt anxious. Md made aware and orders obtained. Eulas Post, RN

## 2019-07-09 NOTE — Progress Notes (Signed)
PROGRESS NOTE    [name] [name]  [id number] DOB: [birth year] DOA: 07/05/2019 PCP: Cassandria Anger, MD   Brief Narrative: 85 year old lady with prior h/o NICM with recent echo showing LVEF of 20 to 25%,persistent atrial fib failed cardioversion , hypertension, diverticulosis, hyperlipidemia, systolic and diastolic heart failure, presents with nausea, vomiting and was found to have SBO. NG TUBE was attempted but couldn't be placed, it was finally placed via fluoro guided but pt removed it. Surgery on board. In view her h/o of NICM, and persistent atrial fibrillation with tachy brady in the last 24 hours, she was started on IV metoprolol and cardiology consulted for recommendations. She is also started on IV heparin, in case she requires surgery.  After further discussion with family and the patient, surgery was able to put the NG tube and more than 1000 ml of bilious fluid aspirated. She continues to have the NG tube and on intermittent suction.    Assessment & Plan:   Principal Problem:   SBO (small bowel obstruction) (HCC) Active Problems:   Essential hypertension   Nausea and vomiting   Protein-calorie malnutrition, severe   Small bowel obstruction Patient continues to have small bowel obstruction or repeated films.  Abdomen continues to be distended but  Better than yesterday. Continue with NPO and gentle hydration. Repeat CXR in am as per surgery. She continues be nauseated but no vomiting. Appreciate surgery recommendations.  History of nonischemic cardiomyopathy She appears euvolemic. She denies any chest pain or sob.  Cardiology consulted recommended to start oral metoprolol and Lasix when her bowel obstruction improves. Meanwhile continue with IV metoprolol and prn lasix .    Persistent atrial fibrillation better controlled. Rate between 70 to 110/min.  Prn metoprolol to be used for rates greater than 130/min.  Appreciate cardiology recommendations.  Recent echo showing  LVEF Of 20% to 25%.  Continue with IV heparin with anti coagulation.     Hypertension:  Sub optimally controlled prn hydralazine ordered.    Nausea and vomiting : Persistent nausea,but better than yesterday.  No vomiting.    Mild AKI:  Creatinine at 1. 15. Much better with hydration.    Nutrition  PICC line ordered and TPN to be started later today.  Hypophosphatemia:  Replaced with TPN.   DVT prophylaxis: heparin.  Code Status: full code.  Family Communication: none at bedside.  Discussed with the son and daughter over the phone and son at bedside ON 11/6 Disposition Plan: pending improvement in SBO   Consultants:   Surgery.   Cardiology SIGNED OFF .   Procedures: fluoro guided NG placement.   Antimicrobials: none.   Subjective:   No flatulence, nauseated, no vomiting.  No chest pain or sob.  No bM yet.   Objective: Vitals:   07/09/19 1422 07/09/19 1500 07/09/19 1518 07/09/19 1535  BP: (!) 150/109  (!) 149/99 (!) 147/81  Pulse: 78 87  96  Resp: 20   20  Temp: 97.7 F (36.5 C) 97.7 F (36.5 C)  (!) 97.4 F (36.3 C)  TempSrc: Oral Oral  Axillary  SpO2: 99% 98%  98%  Weight:      Height:        Intake/Output Summary (Last 24 hours) at 07/09/2019 1709 Last data filed at 07/09/2019 1030 Gross per 24 hour  Intake 1220.99 ml  Output 950 ml  Net 270.99 ml   Filed Weights   07/07/19 0500 07/08/19 0228 07/08/19 0500  Weight: 78.9 kg 82.4 kg 78.4  kg    Examination:  General exam: no distress noted.  Respiratory system: diminished air entry at bases, no wheezing .  Cardiovascular system: S1S2, irregular, no JVD.  Gastrointestinal system: ABD IS distended, bowel sounds good. Non tender .  Central nervous system: alert and answering all questions appropriately.  Extremities: no pedal edema.  Skin: no rashes or ulcers.  Psychiatry: in good spirits.     Data Reviewed: I have personally reviewed following labs and imaging studies  CBC: Recent  Labs  Lab 07/05/19 1822 07/06/19 0426 07/07/19 0920 07/08/19 0357 07/09/19 0344  WBC 4.8 6.6 5.7 5.3 4.5  NEUTROABS  --   --   --   --  2.1  HGB 15.4* 14.8 14.9 14.8 13.4  HCT 45.3 45.5 46.7* 45.2 41.7  MCV 86.9 87.8 90.5 88.3 88.9  PLT 190 185 185 181 0000000   Basic Metabolic Panel: Recent Labs  Lab 07/05/19 1822 07/06/19 0426 07/06/19 0440 07/07/19 0920 07/08/19 0357 07/09/19 0344  NA 136 139  --  139 138 141  K 4.1 3.9  --  4.6 4.4 3.8  CL 99 101  --  99 100 102  CO2 22 25  --  27 23 29   GLUCOSE 149* 134*  --  132* 116* 128*  BUN 20 24*  --  34* 43* 38*  CREATININE 1.40* 1.36*  --  1.77* 1.66* 1.15*  CALCIUM 10.0 9.5  --  9.7 9.3 9.2  MG  --   --  1.8  --  2.1 2.3  PHOS  --   --   --   --  3.3 1.8*   GFR: Estimated Creatinine Clearance: 35.3 mL/min (A) (by C-G formula based on SCr of 1.15 mg/dL (H)). Liver Function Tests: Recent Labs  Lab 07/05/19 1822 07/06/19 0426 07/09/19 0344  AST 30 25 23   ALT 22 20 20   ALKPHOS 47 40 36*  BILITOT 1.1 1.0 0.7  PROT 7.5 6.8 6.3*  ALBUMIN 4.1 3.7 3.3*   Recent Labs  Lab 07/05/19 1822  LIPASE 39   No results for input(s): AMMONIA in the last 168 hours. Coagulation Profile: No results for input(s): INR, PROTIME in the last 168 hours. Cardiac Enzymes: No results for input(s): CKTOTAL, CKMB, CKMBINDEX, TROPONINI in the last 168 hours. BNP (last 3 results) No results for input(s): PROBNP in the last 8760 hours. HbA1C: No results for input(s): HGBA1C in the last 72 hours. CBG: Recent Labs  Lab 07/08/19 2345 07/09/19 0433 07/09/19 0742 07/09/19 1105 07/09/19 1608  GLUCAP 127* 129* 116* 140* 129*   Lipid Profile: Recent Labs    07/09/19 0344  TRIG 88   Thyroid Function Tests: No results for input(s): TSH, T4TOTAL, FREET4, T3FREE, THYROIDAB in the last 72 hours. Anemia Panel: No results for input(s): VITAMINB12, FOLATE, FERRITIN, TIBC, IRON, RETICCTPCT in the last 72 hours. Sepsis Labs: No results for  input(s): PROCALCITON, LATICACIDVEN in the last 168 hours.  Recent Results (from the past 240 hour(s))  SARS Coronavirus 2 by RT PCR (hospital order, performed in [facility] hospital lab) Nasopharyngeal Nasopharyngeal Swab     Status: None   Collection Time: 07/05/19  9:34 PM   Specimen: Nasopharyngeal Swab  Result Value Ref Range Status   SARS Coronavirus 2 NEGATIVE NEGATIVE Final    Comment: (NOTE) If result is NEGATIVE SARS-CoV-2 target nucleic acids are NOT DETECTED. The SARS-CoV-2 RNA is generally detectable in upper and lower  respiratory specimens during the acute phase of infection. The lowest  concentration of SARS-CoV-2 viral copies this assay can detect is 250  copies / mL. A negative result does not preclude SARS-CoV-2 infection  and should not be used as the sole basis for treatment or other  patient management decisions.  A negative result may occur with  improper specimen collection / handling, submission of specimen other  than nasopharyngeal swab, presence of viral mutation(s) within the  areas targeted by this assay, and inadequate number of viral copies  (<250 copies / mL). A negative result must be combined with clinical  observations, patient history, and epidemiological information. If result is POSITIVE SARS-CoV-2 target nucleic acids are DETECTED. The SARS-CoV-2 RNA is generally detectable in upper and lower  respiratory specimens dur ing the acute phase of infection.  Positive  results are indicative of active infection with SARS-CoV-2.  Clinical  correlation with patient history and other diagnostic information is  necessary to determine patient infection status.  Positive results do  not rule out bacterial infection or co-infection with other viruses. If result is PRESUMPTIVE POSTIVE SARS-CoV-2 nucleic acids MAY BE PRESENT.   A presumptive positive result was obtained on the submitted specimen  and confirmed on repeat testing.  While 2019 novel  coronavirus  (SARS-CoV-2) nucleic acids may be present in the submitted sample  additional confirmatory testing may be necessary for epidemiological  and / or clinical management purposes  to differentiate between  SARS-CoV-2 and other Sarbecovirus currently known to infect humans.  If clinically indicated additional testing with an alternate test  methodology [phone]) is advised. The SARS-CoV-2 RNA is generally  detectable in upper and lower respiratory sp ecimens during the acute  phase of infection. The expected result is Negative. Fact Sheet for Patients:  StrictlyIdeas.no Fact Sheet for Healthcare Providers: BankingDealers.co.za This test is not yet approved or cleared by the [place] FDA and has been authorized for detection and/or diagnosis of SARS-CoV-2 by FDA under an Emergency Use Authorization (EUA).  This EUA will remain in effect (meaning this test can be used) for the duration of the COVID-19 declaration under Section 564(b)(1) of the Act, 21 U.S.C. section 360bbb-3(b)(1), unless the authorization is terminated or revoked sooner. Performed at [facility], [address], [place], [place] [address]          Radiology Studies: Dg Abd 1 View  Result Date: 07/09/2019 CLINICAL DATA:  Small bowel obstruction. EXAM: ABDOMEN - 1 VIEW COMPARISON:  Abdominal radiograph 07/08/2019 FINDINGS: NG tube tip and side-port project over the gastric antrum/proximal duodenum. Re demonstrated gaseous distended loops of small bowel within the abdomen, similar to mildly decreased from prior. Small amount of high density within the cecum and ascending colon favored represent oral contrast. Lumbar spine degenerative changes. IMPRESSION: Similar to mildly improved gaseous distended loops of small bowel within the central abdomen. Electronically Signed   By: Lovey Newcomer M.D.   On: 07/09/2019 09:23   Dg Abd 1 View  Result Date:  07/08/2019 CLINICAL DATA:  NG tube placed EXAM: ABDOMEN - 1 VIEW COMPARISON:  Plain film same day FINDINGS: NG tube with tip in the distal gastric antrum. Dilated loops of small bowel up to 4 cm similar comparison exam. No free air IMPRESSION: NG tube with tip in distal stomach. Electronically Signed   By: Suzy Bouchard M.D.   On: 07/08/2019 14:19   Dg Chest Port 1 View  Result Date: 07/08/2019 CLINICAL DATA:  PICC line placement EXAM: PORTABLE CHEST 1 VIEW COMPARISON:  04/18/2019 FINDINGS:  Esophageal tube tip is below the diaphragm but non included. Right upper extremity catheter tip superimposes the distal SVC. Cardiomegaly with central vascular congestion. Aortic atherosclerosis. No pneumothorax. IMPRESSION: 1. Right upper extremity catheter tip superimposes the distal SVC. 2. Cardiomegaly Electronically Signed   By: Donavan Foil M.D.   On: 07/08/2019 17:26   Dg Abd Portable 1v  Result Date: 07/08/2019 CLINICAL DATA:  Small-bowel obstruction. EXAM: PORTABLE ABDOMEN - 1 VIEW COMPARISON:  July 07, 2019 FINDINGS: Again identified is a moderate grade small bowel obstruction. The majority of the oral contrast remains in the stomach and small bowel. There is no definite pneumatosis or free air. IMPRESSION: Persistent small bowel obstruction. The majority of the oral contrast remains within the stomach and small bowel. The stomach is distended. The enteric tube is not visualized on this exam. Electronically Signed   By: Constance Holster M.D.   On: 07/08/2019 05:45   [place] Ekg Site Rite  Result Date: 07/08/2019 If Site Rite image not attached, placement could not be confirmed due to current cardiac rhythm.  [place] Ekg Site Rite  Result Date: 07/08/2019 If Site Rite image not attached, placement could not be confirmed due to current cardiac rhythm.       Scheduled Meds: . Chlorhexidine Gluconate Cloth  6 each Topical Daily  . diphenhydrAMINE  12.5 mg Intravenous QPM  . insulin aspart  0-15  Units Subcutaneous Q4H  . lidocaine  1 application Other Once  . mouth rinse  15 mL Mouth Rinse BID  . pantoprazole (PROTONIX) IV  40 mg Intravenous QHS  . potassium chloride SA  20 mEq Oral BID   Continuous Infusions: . sodium chloride 50 mL/hr at 07/09/19 0315  . heparin 900 Units/hr (07/09/19 1655)  . potassium PHOSPHATE IVPB (in mmol) 20 mmol (07/09/19 1123)  . TPN ADULT (ION) 40 mL/hr at 07/08/19 1753  . TPN ADULT (ION)       LOS: 4 days       Hosie Poisson, MD [facility] Pager [phone]  If 7PM-7AM, please contact night-coverage [email or web address] Password [email or web address] 07/09/2019, 5:09 PM

## 2019-07-09 NOTE — Progress Notes (Signed)
Assessment & Plan: Small bowel obstruction - likely due to adhesions - AXR this AM with dilated loops small bowel, some gas and stool in colon; NG in position in stomach - encouraged ambulation, rocking chair today - continue NPO, IVF - repeat AXR in AM 11/8  CKD CHF/nonischemic cardiomyopathy with EF 20 to 25% - cardiology clearance in notes Atrial fibrillation/failed cardioversion on apixaban - currently heparin gtts Mild troponin elevation Hypertension Hyperlipidemia Hx H. pylori gastritis Weight loss  FEN:NPO except sips and chips ID: None DVT: Heparingtts Follow-up: To be determined        Armandina Gemma, MD       [facility]       Office: [phone]   Chief Complaint: Small bowel obstruction  Subjective: Patient in bed, wants TV to work.  Less nausea with NG. Had small BM yesterday, denies flatus.  Objective: Vital signs in last 24 hours: Temp:  [97.6 F (36.4 C)-97.8 F (36.6 C)] 97.7 F (36.5 C) (11/07 0425) Pulse Rate:  [77-100] 77 (11/07 0425) Resp:  [14-20] 20 (11/07 0425) BP: (117-144)/(68-85) 144/85 (11/07 0425) SpO2:  [93 %-99 %] 99 % (11/07 0425) Last BM Date: 07/07/19(pt stated small one )  Intake/Output from previous day: 11/06 0701 - 11/07 0700 In: 1687.9 [I.V.:1687.9] Out: 3600 [Emesis/NG output:3600] Intake/Output this shift: No intake/output data recorded.  Physical Exam: HEENT - sclerae clear, mucous membranes moist Neck - soft Chest - clear bilaterally Cor - RRR Abdomen - softer, less distended; non-tender; NG bilious Neuro - alert & oriented, no focal deficits  Lab Results:  Recent Labs    07/08/19 0357 07/09/19 0344  WBC 5.3 4.5  HGB 14.8 13.4  HCT 45.2 41.7  PLT 181 175   BMET Recent Labs    07/08/19 0357 07/09/19 0344  NA 138 141  K 4.4 3.8  CL 100 102  CO2 23 29  GLUCOSE 116* 128*  BUN 43* 38*  CREATININE 1.66* 1.15*  CALCIUM 9.3 9.2   PT/INR No results for input(s): LABPROT,  INR in the last 72 hours. Comprehensive Metabolic Panel:    Component Value Date/Time   NA 141 07/09/2019 0344   NA 138 07/08/2019 0357   NA 143 08/30/2018 0825   NA 141 11/05/2015 1429   NA 141 08/30/2014 0916   K 3.8 07/09/2019 0344   K 4.4 07/08/2019 0357   K 3.8 11/05/2015 1429   K 4.4 08/30/2014 0916   CL 102 07/09/2019 0344   CL 100 07/08/2019 0357   CL 102 05/13/2012 1021   CO2 29 07/09/2019 0344   CO2 23 07/08/2019 0357   CO2 28 11/05/2015 1429   CO2 28 08/30/2014 0916   BUN 38 (H) 07/09/2019 0344   BUN 43 (H) 07/08/2019 0357   BUN 12 08/30/2018 0825   BUN 13.8 11/05/2015 1429   BUN 12.8 08/30/2014 0916   CREATININE 1.15 (H) 07/09/2019 0344   CREATININE 1.66 (H) 07/08/2019 0357   CREATININE 0.9 11/05/2015 1429   CREATININE 0.9 08/30/2014 0916   GLUCOSE 128 (H) 07/09/2019 0344   GLUCOSE 116 (H) 07/08/2019 0357   GLUCOSE 117 11/05/2015 1429   GLUCOSE 96 08/30/2014 0916   GLUCOSE 95 05/13/2012 1021   CALCIUM 9.2 07/09/2019 0344   CALCIUM 9.3 07/08/2019 0357   CALCIUM 9.8 11/05/2015 1429   CALCIUM 9.9 08/30/2014 0916   AST 23 07/09/2019 0344   AST 25 07/06/2019 0426   AST 13 11/05/2015 1429   AST 15  08/30/2014 0916   ALT 20 07/09/2019 0344   ALT 20 07/06/2019 0426   ALT 14 11/05/2015 1429   ALT 9 08/30/2014 0916   ALKPHOS 36 (L) 07/09/2019 0344   ALKPHOS 40 07/06/2019 0426   ALKPHOS 68 11/05/2015 1429   ALKPHOS 79 08/30/2014 0916   BILITOT 0.7 07/09/2019 0344   BILITOT 1.0 07/06/2019 0426   BILITOT <0.30 11/05/2015 1429   BILITOT 0.42 08/30/2014 0916   PROT 6.3 (L) 07/09/2019 0344   PROT 6.8 07/06/2019 0426   PROT 7.2 11/05/2015 1429   PROT 7.7 08/30/2014 0916   ALBUMIN 3.3 (L) 07/09/2019 0344   ALBUMIN 3.7 07/06/2019 0426   ALBUMIN 3.4 (L) 11/05/2015 1429   ALBUMIN 3.8 08/30/2014 0916    Studies/Results: Dg Abd 1 View  Result Date: 07/08/2019 CLINICAL DATA:  NG tube placed EXAM: ABDOMEN - 1 VIEW COMPARISON:  Plain film same day FINDINGS: NG tube  with tip in the distal gastric antrum. Dilated loops of small bowel up to 4 cm similar comparison exam. No free air IMPRESSION: NG tube with tip in distal stomach. Electronically Signed   By: Suzy Bouchard M.D.   On: 07/08/2019 14:19   Dg Abd 1 View  Result Date: 07/07/2019 CLINICAL DATA:  NG placement. EXAM: ABDOMEN - 1 VIEW COMPARISON:  Earlier film, same date. FINDINGS: The NG tube is kinked back on itself and likely in the distal esophagus. It should be advanced several cm. Persistent small-bowel obstruction bowel gas pattern. IMPRESSION: The NG tube is kinked back on itself likely in the distal esophagus. It should be advanced several cm. Electronically Signed   By: Marijo Sanes M.D.   On: 07/07/2019 11:28   Dg Chest Port 1 View  Result Date: 07/08/2019 CLINICAL DATA:  PICC line placement EXAM: PORTABLE CHEST 1 VIEW COMPARISON:  04/18/2019 FINDINGS: Esophageal tube tip is below the diaphragm but non included. Right upper extremity catheter tip superimposes the distal SVC. Cardiomegaly with central vascular congestion. Aortic atherosclerosis. No pneumothorax. IMPRESSION: 1. Right upper extremity catheter tip superimposes the distal SVC. 2. Cardiomegaly Electronically Signed   By: Donavan Foil M.D.   On: 07/08/2019 17:26   Dg Abd Portable 1v  Result Date: 07/08/2019 CLINICAL DATA:  Small-bowel obstruction. EXAM: PORTABLE ABDOMEN - 1 VIEW COMPARISON:  July 07, 2019 FINDINGS: Again identified is a moderate grade small bowel obstruction. The majority of the oral contrast remains in the stomach and small bowel. There is no definite pneumatosis or free air. IMPRESSION: Persistent small bowel obstruction. The majority of the oral contrast remains within the stomach and small bowel. The stomach is distended. The enteric tube is not visualized on this exam. Electronically Signed   By: Constance Holster M.D.   On: 07/08/2019 05:45   [place] Ekg Site Rite  Result Date: 07/08/2019 If Site Rite image not  attached, placement could not be confirmed due to current cardiac rhythm.  [place] Ekg Site Rite  Result Date: 07/08/2019 If Site Rite image not attached, placement could not be confirmed due to current cardiac rhythm.     Armandina Gemma 07/09/2019  Patient ID: Ester Rink, female   DOB: February 21, 1933, 85 y.o.   MRN: [id number]

## 2019-07-09 NOTE — Progress Notes (Signed)
[facility] NOTE   Pharmacy Consult for TPN Indication: SBP  Patient Measurements: Height: 5\' 2"  (157.5 cm) Weight: 172 lb 13.5 oz (78.4 kg) IBW/kg (Calculated) : 50.1 TPN AdjBW (KG): 56.7 Body mass index is 31.61 kg/m. Usual Weight:   Current Nutrition: NPO  IVF: NS at 50 ml/hr-  11/6 order to decr to 10 ml/hr by Rph increased back to 50 ml/hr by Akula 11/6 due to [employer] access: PICC placed 11/6 for TPN TPN start date: 07/08/2019  ASSESSMENT  -SBO likely due to adhesions                                                                                                HPI: CKD, CHF, NICM EF 20-25%, Afib, HTN, HLD, Hx H pylori gastritis, diverticulosis  Significant events: 11/5 NGT placed by IR via fluoro but pt insisted it be removed 11/6 pt agrees to have IR place NGT via fluoro, PICC/TPN ordered   Today:   Glucose: no hx DM, 7 units mod SSI/24 hrs, CBGs 116-153 on TPN at 40 ml/hr- acceptable  Electrolytes: hx Afib so goal K> = 4, Mag >=2; on Kdur 20 bid as PTA but home lasix has been held; K 3.8, mag 2.3, phos low at 1.8  Renal: hx CKD, AKI on admit has resolved. SCr 1.2-1.57 04/2019  NICM EF 20-25%, currently not volume overloaded, home lasix held  NGO 3600 mls/24 hrs,   LFTs: WNL on admit, 11/7: alb 3.3,  TGs: on pravachol PTA, tirg 88 11/7  Prealbumin: 20.1 11/7 - WNL  NUTRITIONAL GOALS                                                                                             RD recs: 07/08/2019 Kcal:  1550-1750 Protein:  75-90 grams Fluid:  >/= 1.5 L  Custom TPN at goal rate of 65 ml/hr provides: - 85.8  g/day protein (55 g/L) - 46.8  g/day Lipid  (30 g/L) - 249.6  g/day Dextrose (16 %) -  1659.84 Kcal/day  PLAN       Now: Know 20 mMol (~ 29 meq K)  At 1800 today:  Increase TPN to goal rate  of 65 ml/hr. -  Provides 85.8  g of protein, and  provides ~ 1660 kCals per day.  Meeting 100% of goals,   electrolytes in TPN: Standard, Cl:Ac ratio 1:1  TPN to contain standard multivitamins and trace elements.  Per D/w MD: DC NS at 50 ml/hr at 1800 when TPN rate 40>>65 ml/hr  continue moderate SSI added by CCS  TPN lab panels on Mondays & Thursdays. BMET, Mg and Phos in AM  F/u daily.  Eudelia Bunch, Pharm.D [phone] 07/09/2019 8:59 AM

## 2019-07-09 NOTE — Progress Notes (Signed)
.  ANTICOAGULATION CONSULT NOTE - Follow Up Consult  Pharmacy Consult for Heparin Indication: atrial fibrillation--PTA apixaban  Allergies  Allergen Reactions  . Anastrozole Other (See Comments)    arthralgias  . Aspirin Other (See Comments)    Gi upset   . Ciprofloxacin Itching  . Letrozole Other (See Comments)    leg pain    Patient Measurements: Height: 5\' 2"  (157.5 cm) Weight: 172 lb 13.5 oz (78.4 kg) IBW/kg (Calculated) : 50.1 Heparin Dosing Weight:   Vital Signs: Temp: 97.7 F (36.5 C) (11/07 0425) Temp Source: Oral (11/07 0425) BP: 144/85 (11/07 0425) Pulse Rate: 77 (11/07 0425)  Labs: Recent Labs    07/07/19 0920 07/07/19 1559 07/08/19 0357 07/09/19 0344  HGB 14.9  --  14.8 13.4  HCT 46.7*  --  45.2 41.7  PLT 185  --  181 175  APTT 91* 86* 78* 72*  HEPARINUNFRC 1.36* 1.20* 0.96* 0.80*  CREATININE 1.77*  --  1.66*  --     Estimated Creatinine Clearance: 24.5 mL/min (A) (by C-G formula based on SCr of 1.66 mg/dL (H)).   Medications:  Infusions:  . sodium chloride 50 mL/hr at 07/09/19 0315  . heparin 900 Units/hr (07/07/19 0415)  . TPN ADULT (ION) 40 mL/hr at 07/08/19 1753    Assessment: Patient with high heparin level but PTT at goal.  No heparin issues per RN.  Goal of Therapy:  Heparin level 0.3-0.7 units/ml aPTT 66-102 seconds Monitor platelets by anticoagulation protocol: Yes   Plan:  Continue heparin drip at current rate Recheck levels with AM labs  Tyler Deis, Shea Stakes Crowford 07/09/2019,5:20 AM

## 2019-07-09 NOTE — Progress Notes (Signed)
   07/09/19 1112  Vitals  Temp 99.7 F (37.6 C)  Temp Source Rectal  BP (!) 141/102  MAP (mmHg) 114  BP Location Left Leg  BP Method Automatic  Patient Position (if appropriate) Sitting  Pulse Rate 90  Pulse Rate Source Monitor  Resp (!) 22  Oxygen Therapy  SpO2 99 %  O2 Device Room Air  MEWS Score  MEWS RR 1  MEWS Pulse 0  MEWS Systolic 0  MEWS LOC 0  MEWS Temp 0  MEWS Score 1  MEWS Score Color Green  MEWS turned Red due to increased BP and HR in 130's nonsustained at 11:14.

## 2019-07-10 ENCOUNTER — Inpatient Hospital Stay (HOSPITAL_COMMUNITY): Payer: Medicare Other

## 2019-07-10 LAB — BASIC METABOLIC PANEL
Anion gap: 10 (ref 5–15)
BUN: 31 mg/dL — ABNORMAL HIGH (ref 8–23)
CO2: 27 mmol/L (ref 22–32)
Calcium: 9.3 mg/dL (ref 8.9–10.3)
Chloride: 103 mmol/L (ref 98–111)
Creatinine, Ser: 0.87 mg/dL (ref 0.44–1.00)
GFR calc Af Amer: >60 mL/min (ref >60)
GFR calc non Af Amer: >60 mL/min (ref >60)
Glucose, Bld: 129 mg/dL — ABNORMAL HIGH (ref 70–99)
Potassium: 3.6 mmol/L (ref 3.5–5.1)
Sodium: 140 mmol/L (ref 135–145)

## 2019-07-10 LAB — APTT
aPTT: 58 seconds — ABNORMAL HIGH (ref 24–36)
aPTT: 71 seconds — ABNORMAL HIGH (ref 24–36)

## 2019-07-10 LAB — CBC
HCT: 43.6 % (ref 36.0–46.0)
Hemoglobin: 14.2 g/dL (ref 12.0–15.0)
MCH: 28.9 pg (ref 26.0–34.0)
MCHC: 32.6 g/dL (ref 30.0–36.0)
MCV: 88.6 fL (ref 80.0–100.0)
Platelets: 161 10*3/uL (ref 150–400)
RBC: 4.92 MIL/uL (ref 3.87–5.11)
RDW: 16.5 % — ABNORMAL HIGH (ref 11.5–15.5)
WBC: 6.1 10*3/uL (ref 4.0–10.5)
nRBC: 0 % (ref 0.0–0.2)

## 2019-07-10 LAB — PHOSPHORUS: Phosphorus: 2.4 mg/dL — ABNORMAL LOW (ref 2.5–4.6)

## 2019-07-10 LAB — GLUCOSE, CAPILLARY
Glucose-Capillary: 127 mg/dL — ABNORMAL HIGH (ref 70–99)
Glucose-Capillary: 129 mg/dL — ABNORMAL HIGH (ref 70–99)
Glucose-Capillary: 137 mg/dL — ABNORMAL HIGH (ref 70–99)

## 2019-07-10 LAB — HEPARIN LEVEL (UNFRACTIONATED)
Heparin Unfractionated: 0.45 IU/mL (ref 0.30–0.70)
Heparin Unfractionated: 0.35 IU/mL (ref 0.30–0.70)

## 2019-07-10 LAB — MAGNESIUM: Magnesium: 2 mg/dL (ref 1.7–2.4)

## 2019-07-10 MED ORDER — POTASSIUM CHLORIDE 10 MEQ/100ML IV SOLN
INTRAVENOUS | Status: AC
  Filled 2019-07-10: qty 100

## 2019-07-10 MED ORDER — TRAVASOL 10 % IV SOLN
INTRAVENOUS | Status: AC
  Administered 2019-07-10: 17:00:00 via INTRAVENOUS
  Filled 2019-07-10: qty 858

## 2019-07-10 MED ORDER — HALOPERIDOL LACTATE 5 MG/ML IJ SOLN
2.0000 mg | Freq: Four times a day (QID) | INTRAMUSCULAR | Status: DC | PRN
  Administered 2019-07-10: 2 mg via INTRAMUSCULAR
  Filled 2019-07-10: qty 1

## 2019-07-10 MED ORDER — POTASSIUM PHOSPHATES 15 MMOLE/5ML IV SOLN
10.0000 mmol | Freq: Once | INTRAVENOUS | Status: AC
  Administered 2019-07-10: 10 mmol via INTRAVENOUS
  Filled 2019-07-10: qty 3.33

## 2019-07-10 MED ORDER — POTASSIUM CHLORIDE 10 MEQ/50ML IV SOLN
10.0000 meq | INTRAVENOUS | Status: AC
  Administered 2019-07-10 (×2): 10 meq via INTRAVENOUS
  Filled 2019-07-10 (×2): qty 50

## 2019-07-10 MED ORDER — SODIUM CHLORIDE 0.9 % IV SOLN
INTRAVENOUS | Status: DC | PRN
  Administered 2019-07-10 – 2019-07-11 (×2): 250 mL via INTRAVENOUS

## 2019-07-10 NOTE — Consult Note (Signed)
Consultation Note Date: 07/10/2019   Patient Name: [name] [name]  DOB: August 27, 1933  MRN: [id number]  Age / Sex: 85 y.o., female  PCP: Plotnikov, Evie Lacks, MD Referring Physician: Hosie Poisson, MD  Reason for Consultation: Establishing goals of care  HPI/Patient Profile: 85 y.o. female  with past medical history of NICM (EF 20-25%), a fib, HTN, diverticulosis, HLP, CHF admitted on 07/05/2019 with SBO.  Initially NGT could not be placed, but one present this AM and noted to have 1051m as soon as placed.  Currently continuing medical management.  Palliative consulted for [place]   Clinical Assessment and Goals of Care: Chart reviewed including personal review of pertinent labs and imaging.    I met today with [name] [name].  She report that she is feeling "a little better, maybe."  States that her family and faith are most important things to her.  She feels that the doctors have been doing a good job of explaining things to her.  We reviewed her clinical course and discussed potential decisions of care options that may occur based on her clinical course.  She understands her situation and is hopeful to avoid surgery, but open to idea if necessary.  SUMMARY OF RECOMMENDATIONS   - Full Code/Full Scope - [name] [name] is invested in plan for continued aggressive therapy and would pursue all offered interventions. - PMT to continue to follow peripherally.  Please call if there are specific areas with which we can be of assistance in her care.  Code Status/Advance Care Planning:  Full code  Psycho-social/Spiritual:   Desire for further Chaplaincy support:no  Additional Recommendations: Caregiving  Support/Resources  Prognosis:   Guarded  Discharge Planning: To Be Determined      Primary Diagnoses: Present on Admission: . SBO (small bowel obstruction) ([place] . Nausea and vomiting . Essential hypertension    I have reviewed the medical record, interviewed the patient and family, and examined the patient. The following aspects are pertinent.  Past Medical History:  Diagnosis Date  . Acute lymphadenitis of arm   . Anemia    iron deficiency  . Atrial fibrillation ([place]   . Breast cancer ([place]   . Chronic combined systolic and diastolic CHF, NYHA class 2 and ACA/AHA stage C 04/2019   Previously had mildly reduced EF of 45%, reduced to 20-25% as of August 2020 with global hypokinesis and septal-apical akinesis.  . Depression 2009   grief  . Dilated cardiomyopathy ([place] 04/2019   Presumed to be nonischemic (normal coronary CTA January 2020).  EF by echo 20 to 25% and by CMR 23%.  Severely reduced RV function at 33% EF..Marland KitchenSevere biatrial enlargement.  Elevated RV P and LVEDP.  Dilated IVC..Marland KitchenCMRI suggests septal scar stripe.  . Diverticulosis of colon 2004   Dr KDeatra Ina . GERD (gastroesophageal reflux disease)   . Heart murmur   . Helicobacter pylori gastritis   . Hematuria    Dr PTerance Hart . Hyperlipidemia   . Hypertension   . Osteoarthritis, knee   .  Skin cancer   . Vitamin B12 deficiency   . Vitamin D deficiency    Social History   Socioeconomic History  . Marital status: Widowed    Spouse name: Not on file  . Number of children: 7  . Years of education: Not on file  . Highest education level: Not on file  Occupational History  . Not on file  Social Needs  . Financial resource strain: Not hard at all  . Food insecurity    Worry: Never true    Inability: Never true  . Transportation needs    Medical: No    Non-medical: No  Tobacco Use  . Smoking status: Never Smoker  . Smokeless tobacco: Never Used  Substance and Sexual Activity  . Alcohol use: No  . Drug use: No  . Sexual activity: Never  Lifestyle  . Physical activity    Days per week: 3 days    Minutes per session: 50 min  . Stress: Not at all  Relationships  . Social connections    Talks on phone: More than  three times a week    Gets together: More than three times a week    Attends religious service: More than 4 times per year    Active member of club or organization: Yes    Attends meetings of clubs or organizations: More than 4 times per year    Relationship status: Widowed  Other Topics Concern  . Not on file  Social History Narrative  . Not on file   Family History  Problem Relation Age of Onset  . Cancer Sister        breast  . Cancer Maternal Aunt        breast  . Coronary artery disease Other   . Cancer Other        aunt, niece  . Atrial fibrillation Other    Scheduled Meds: . Chlorhexidine Gluconate Cloth  6 each Topical Daily  . diphenhydrAMINE  12.5 mg Intravenous QPM  . insulin aspart  0-15 Units Subcutaneous Q4H  . mouth rinse  15 mL Mouth Rinse BID  . pantoprazole (PROTONIX) IV  40 mg Intravenous QHS   Continuous Infusions: . heparin 900 Units/hr (07/10/19 0329)  . potassium chloride    . potassium PHOSPHATE IVPB (in mmol)    . TPN ADULT (ION) 65 mL/hr at 07/10/19 0329   PRN Meds:.acetaminophen **OR** acetaminophen, haloperidol lactate, hydrALAZINE, HYDROmorphone (DILAUDID) injection, LORazepam, metoprolol tartrate, ondansetron (ZOFRAN) IV, phenol, prochlorperazine, sodium chloride flush, temazepam Medications Prior to Admission:  Prior to Admission medications   Medication Sig Start Date End Date Taking? Authorizing Provider  acetaminophen (TYLENOL) 500 MG tablet Take 500 mg by mouth 2 (two) times daily as needed for moderate pain.    Yes [provider]  apixaban (ELIQUIS) 5 MG TABS tablet Take 1 tablet (5 mg total) by mouth 2 (two) times daily. 06/17/19  Yes Leonie Man, MD  [facility] B-12 500 MCG SUBL Place 2 tablets (1,000 mcg total) under the tongue daily. 04/21/18  Yes Plotnikov, Evie Lacks, MD  diphenoxylate-atropine (LOMOTIL) 2.5-0.025 MG tablet Take 1 tablet by mouth 4 (four) times daily as needed for diarrhea or loose stools. 06/29/19  Yes  Plotnikov, Evie Lacks, MD  furosemide (LASIX) 40 MG tablet Take 1 tablet (40 mg total) by mouth daily. MAY AN ADDITIONAL DOES IF WEIGHT IS GREATER THAN 3 LBS OR AS DIRECTED 05/20/19  Yes Leonie Man, MD  HYDROcodone-acetaminophen (NORCO/VICODIN) 5-325 MG tablet  Take 0.5-1 tablets by mouth every 6 (six) hours as needed for severe pain. 06/29/19 06/28/20 Yes Plotnikov, Evie Lacks, MD  Multiple Vitamin (MULTIVITAMIN WITH MINERALS) TABS tablet Take 1 tablet by mouth daily.   Yes [provider]  pantoprazole (PROTONIX) 40 MG tablet Take 1 tablet (40 mg total) by mouth 2 (two) times daily. 09/28/18  Yes Plotnikov, Evie Lacks, MD  potassium chloride SA (KLOR-CON M20) 20 MEQ tablet Take 1 tablet (20 mEq total) by mouth 2 (two) times daily. TAKE AN ADDITIONAL DOSE IT EXTRA FUROSEMIDE IS TAKEN 05/20/19  Yes Leonie Man, MD  pravastatin (PRAVACHOL) 20 MG tablet Take 1 tablet (20 mg total) by mouth daily. 07/19/18 07/19/19 Yes Plotnikov, Evie Lacks, MD  spironolactone (ALDACTONE) 25 MG tablet Take 0.5 tablets (12.5 mg total) by mouth daily. 05/20/19 08/18/19 Yes Leonie Man, MD  temazepam (RESTORIL) 15 MG capsule Take 1 capsule (15 mg total) by mouth at bedtime as needed for sleep. 06/15/19  Yes Plotnikov, Evie Lacks, MD  triamcinolone cream (KENALOG) 0.5 % APPLY  CREAM TOPICALLY TO AFFECTED AREA 4 TIMES DAILY ON RASH Patient taking differently: Apply 1 application topically 4 (four) times daily.  06/27/19  Yes Plotnikov, Evie Lacks, MD  losartan (COZAAR) 100 MG tablet Take 1 tablet by mouth once daily 07/06/19   Plotnikov, Evie Lacks, MD   Allergies  Allergen Reactions  . Anastrozole Other (See Comments)    arthralgias  . Aspirin Other (See Comments)    Gi upset   . Ciprofloxacin Itching  . Letrozole Other (See Comments)    leg pain   Review of Systems  Constitutional: Positive for activity change and fatigue.  Gastrointestinal: Positive for abdominal distention, abdominal pain and nausea.   Neurological: Positive for weakness.  Psychiatric/Behavioral: Positive for sleep disturbance.    Physical Exam General: Alert, awake, in no acute distress.  Sitting in bedside chair with NGT in place. Heart: irregualr. No murmur appreciated. Lungs: Good air movement, clear Abdomen: Nontender, distended, positive bowel sounds.  Ext: No significant edema Skin: Warm and dry Neuro: Grossly intact, nonfocal.   Vital Signs: BP (!) 132/93 (BP Location: Right Leg)   Pulse (!) 59   Temp 98.7 F (37.1 C) (Oral)   Resp 17   Ht '5\' 2"'  (1.575 m)   Wt 76.8 kg   SpO2 98%   BMI 30.97 kg/m  Pain Scale: 0-10   Pain Score: Asleep   SpO2: SpO2: 98 % O2 Device:SpO2: 98 % O2 Flow Rate: .   IO: Intake/output summary:   Intake/Output Summary (Last 24 hours) at 07/10/2019 8127 Last data filed at 07/10/2019 0631 Gross per 24 hour  Intake 1354.66 ml  Output 900 ml  Net 454.66 ml    LBM: Last BM Date: 07/07/19(pt stated small one ) Baseline Weight: Weight: 78 kg Most recent weight: Weight: 76.8 kg     Palliative Assessment/Data:   Flowsheet Rows     Most Recent Value  Intake Tab  Referral Department  Hospitalist  Unit at Time of Referral  Med/Surg Unit  Palliative Care Primary Diagnosis  Other (Comment) [SBO]  Date Notified  07/07/19  Palliative Care Type  New Palliative care  Reason for referral  Clarify Goals of Care  Date of Admission  07/05/19  Date first seen by Palliative Care  07/09/19  # of days Palliative referral response time  2 Day(s)  # of days IP prior to Palliative referral  2  Clinical Assessment  Palliative Performance Scale  Score  50%  Psychosocial & Spiritual Assessment  Palliative Care Outcomes  Patient/Family meeting held?  Yes  Who was at the meeting?  Patient  Palliative Care Outcomes  Clarified goals of care      Time In: 1100 Time Out: 1150 Time Total: 50 Greater than 50%  of this time was spent counseling and coordinating care related to the  above assessment and plan.  Signed by: Micheline Rough, MD   Please contact Palliative Medicine Team phone at [phone] for questions and concerns.  For individual provider: See Shea Evans

## 2019-07-10 NOTE — Progress Notes (Signed)
ANTICOAGULATION CONSULT NOTE - Follow Up Consult  Pharmacy Consult for Heparin Indication: atrial fibrillation--PTA apixaban  Allergies  Allergen Reactions  . Anastrozole Other (See Comments)    arthralgias  . Aspirin Other (See Comments)    Gi upset   . Ciprofloxacin Itching  . Letrozole Other (See Comments)    leg pain    Patient Measurements: Height: 5\' 2"  (157.5 cm) Weight: 169 lb 5 oz (76.8 kg) IBW/kg (Calculated) : 50.1 Heparin Dosing Weight:    Vital Signs: Temp: 98.7 F (37.1 C) (11/08 0450) Temp Source: Oral (11/08 0450) BP: 132/93 (11/08 0450) Pulse Rate: 59 (11/08 0450)  Labs: Recent Labs    07/08/19 0357 07/09/19 0344 07/10/19 0342 07/10/19 1200  HGB 14.8 13.4 14.2  --   HCT 45.2 41.7 43.6  --   PLT 181 175 161  --   APTT 78* 72* 58* 71*  HEPARINUNFRC 0.96* 0.80* 0.45 0.35  CREATININE 1.66* 1.15* 0.87  --     Estimated Creatinine Clearance: 46.2 mL/min (by C-G formula based on SCr of 0.87 mg/dL).   Assessment: Patient with chronic apixaban for hx of afib.  Last dose apixaban noted 11/2 with unknown time.  PTT ordered with Heparin level until both correlate due to possible drug-lab interaction between oral anticoagulant (rivaroxaban, edoxaban, or apixaban) and anti-Xa level (aka heparin level)  07/10/2019  - aPTT is therapeutic at 71 sec on rate of 900 units/hr.  -Heparin level is therapeutic at 0.35 on rate of 900 units/hr  - APTT and heparin level are now correlating - will use heparin levels to monitor heparin therapy going forward -CBC stable -no bleeding reported   Goal of Therapy:  Heparin level 0.3-0.7 units/ml aPTT 66-102 seconds Monitor platelets by anticoagulation protocol: Yes   Plan:  Continue Heparin drip at 900 units/hr Daily heparin level & CBC while on heparin Monitor for s/sx of bleeding  Eudelia Bunch, Pharm.D [phone] 07/10/2019 1:05 PM

## 2019-07-10 NOTE — Progress Notes (Signed)
ANTICOAGULATION CONSULT NOTE - Follow Up Consult  Pharmacy Consult for Heparin Indication: atrial fibrillation--PTA apixaban  Allergies  Allergen Reactions  . Anastrozole Other (See Comments)    arthralgias  . Aspirin Other (See Comments)    Gi upset   . Ciprofloxacin Itching  . Letrozole Other (See Comments)    leg pain    Patient Measurements: Height: 5\' 2"  (157.5 cm) Weight: 169 lb 5 oz (76.8 kg) IBW/kg (Calculated) : 50.1 Heparin Dosing Weight:   Vital Signs: Temp: 98.7 F (37.1 C) (11/08 0450) Temp Source: Oral (11/08 0450) BP: 132/93 (11/08 0450) Pulse Rate: 59 (11/08 0450)  Labs: Recent Labs    07/08/19 0357 07/09/19 0344 07/10/19 0342  HGB 14.8 13.4 14.2  HCT 45.2 41.7 43.6  PLT 181 175 161  APTT 78* 72* 58*  HEPARINUNFRC 0.96* 0.80* 0.45  CREATININE 1.66* 1.15* 0.87    Estimated Creatinine Clearance: 46.2 mL/min (by C-G formula based on SCr of 0.87 mg/dL).   Medications:  Infusions:  . heparin 900 Units/hr (07/10/19 0329)  . TPN ADULT (ION) 65 mL/hr at 07/10/19 0329    Assessment: Patient with PTT now below goal and heparin level at goal.  While would prefer to have PTT and heparin level correlate more directly, I feel it is close enough to begin only using heparin level to monitor therapy.  However, will get PTT to assure both levels agree.  Goal of Therapy:  Heparin level 0.3-0.7 units/ml Monitor platelets by anticoagulation protocol: Yes   Plan:  Continue heparin drip at current rate Recheck levels at 1200---Heparin level and PTT  Tyler Deis, Shea Stakes [place]ford 07/10/2019,6:28 AM

## 2019-07-10 NOTE — Progress Notes (Signed)
Assessment & Plan: Small bowel obstruction- likely due to adhesions -AXRthis AM is pending - will review when available today - small BM, flatus reported this AM -encouraged ambulation, OOB - continue NPO, IVF; allow ice chips, sips  CKD CHF/nonischemic cardiomyopathy with EF 20 to 25% - cardiology clearance in notes Atrial fibrillation/failed cardioversion on apixaban - currently heparin gtts Mild troponin elevation Hypertension Hyperlipidemia Hx H. pylori gastritis Weight loss  [id number] sips and chips ID: None DVT: Heparingtts Follow-up: To be determined        Armandina Gemma, MD       [facility]       Office: [phone]   Chief Complaint: SBO  Subjective: Patient in bed, sleeping.  Arouses to voice.  Denies abdomina pain.  Reports small loose BM this AM.  Objective: Vital signs in last 24 hours: Temp:  [97.4 F (36.3 C)-99.7 F (37.6 C)] 98.7 F (37.1 C) (11/08 0450) Pulse Rate:  [59-105] 59 (11/08 0450) Resp:  [17-22] 17 (11/08 0450) BP: (132-192)/(81-112) 132/93 (11/08 0450) SpO2:  [95 %-99 %] 98 % (11/08 0450) Weight:  [76.8 kg] 76.8 kg (11/08 0450) Last BM Date: 07/09/19  Intake/Output from previous day: 11/07 0701 - 11/08 0700 In: 1354.7 [I.V.:842.6; IV Piggyback:512.1] Out: 900 [Emesis/NG output:900] Intake/Output this shift: No intake/output data recorded.  Physical Exam: HEENT - sclerae clear, mucous membranes moist Neck - soft Abdomen - soft without significant distension; BS present; non-tender; NG with small bilious output Neuro - alert & oriented, no focal deficits  Lab Results:  Recent Labs    07/09/19 0344 07/10/19 0342  WBC 4.5 6.1  HGB 13.4 14.2  HCT 41.7 43.6  PLT 175 161   BMET Recent Labs    07/09/19 0344 07/10/19 0342  NA 141 140  K 3.8 3.6  CL 102 103  CO2 29 27  GLUCOSE 128* 129*  BUN 38* 31*  CREATININE 1.15* 0.87  CALCIUM 9.2 9.3   PT/INR No results for input(s):  LABPROT, INR in the last 72 hours. Comprehensive Metabolic Panel:    Component Value Date/Time   NA 140 07/10/2019 0342   NA 141 07/09/2019 0344   NA 143 08/30/2018 0825   NA 141 11/05/2015 1429   NA 141 08/30/2014 0916   K 3.6 07/10/2019 0342   K 3.8 07/09/2019 0344   K 3.8 11/05/2015 1429   K 4.4 08/30/2014 0916   CL 103 07/10/2019 0342   CL 102 07/09/2019 0344   CL 102 05/13/2012 1021   CO2 27 07/10/2019 0342   CO2 29 07/09/2019 0344   CO2 28 11/05/2015 1429   CO2 28 08/30/2014 0916   BUN 31 (H) 07/10/2019 0342   BUN 38 (H) 07/09/2019 0344   BUN 12 08/30/2018 0825   BUN 13.8 11/05/2015 1429   BUN 12.8 08/30/2014 0916   CREATININE 0.87 07/10/2019 0342   CREATININE 1.15 (H) 07/09/2019 0344   CREATININE 0.9 11/05/2015 1429   CREATININE 0.9 08/30/2014 0916   GLUCOSE 129 (H) 07/10/2019 0342   GLUCOSE 128 (H) 07/09/2019 0344   GLUCOSE 117 11/05/2015 1429   GLUCOSE 96 08/30/2014 0916   GLUCOSE 95 05/13/2012 1021   CALCIUM 9.3 07/10/2019 0342   CALCIUM 9.2 07/09/2019 0344   CALCIUM 9.8 11/05/2015 1429   CALCIUM 9.9 08/30/2014 0916   AST 23 07/09/2019 0344   AST 25 07/06/2019 0426   AST 13 11/05/2015 1429   AST 15 08/30/2014 0916   ALT 20 07/09/2019  0344   ALT 20 07/06/2019 0426   ALT 14 11/05/2015 1429   ALT 9 08/30/2014 0916   ALKPHOS 36 (L) 07/09/2019 0344   ALKPHOS 40 07/06/2019 0426   ALKPHOS 68 11/05/2015 1429   ALKPHOS 79 08/30/2014 0916   BILITOT 0.7 07/09/2019 0344   BILITOT 1.0 07/06/2019 0426   BILITOT <0.30 11/05/2015 1429   BILITOT 0.42 08/30/2014 0916   PROT 6.3 (L) 07/09/2019 0344   PROT 6.8 07/06/2019 0426   PROT 7.2 11/05/2015 1429   PROT 7.7 08/30/2014 0916   ALBUMIN 3.3 (L) 07/09/2019 0344   ALBUMIN 3.7 07/06/2019 0426   ALBUMIN 3.4 (L) 11/05/2015 1429   ALBUMIN 3.8 08/30/2014 0916    Studies/Results: Dg Abd 1 View  Result Date: 07/09/2019 CLINICAL DATA:  Small bowel obstruction. EXAM: ABDOMEN - 1 VIEW COMPARISON:  Abdominal radiograph  07/08/2019 FINDINGS: NG tube tip and side-port project over the gastric antrum/proximal duodenum. Re demonstrated gaseous distended loops of small bowel within the abdomen, similar to mildly decreased from prior. Small amount of high density within the cecum and ascending colon favored represent oral contrast. Lumbar spine degenerative changes. IMPRESSION: Similar to mildly improved gaseous distended loops of small bowel within the central abdomen. Electronically Signed   By: Lovey Newcomer M.D.   On: 07/09/2019 09:23   Dg Abd 1 View  Result Date: 07/08/2019 CLINICAL DATA:  NG tube placed EXAM: ABDOMEN - 1 VIEW COMPARISON:  Plain film same day FINDINGS: NG tube with tip in the distal gastric antrum. Dilated loops of small bowel up to 4 cm similar comparison exam. No free air IMPRESSION: NG tube with tip in distal stomach. Electronically Signed   By: Suzy Bouchard M.D.   On: 07/08/2019 14:19   Dg Chest Port 1 View  Result Date: 07/08/2019 CLINICAL DATA:  PICC line placement EXAM: PORTABLE CHEST 1 VIEW COMPARISON:  04/18/2019 FINDINGS: Esophageal tube tip is below the diaphragm but non included. Right upper extremity catheter tip superimposes the distal SVC. Cardiomegaly with central vascular congestion. Aortic atherosclerosis. No pneumothorax. IMPRESSION: 1. Right upper extremity catheter tip superimposes the distal SVC. 2. Cardiomegaly Electronically Signed   By: Donavan Foil M.D.   On: 07/08/2019 17:26      Armandina Gemma 07/10/2019  Patient ID: [name] [name], female   DOB: May 23, 1933, 85 y.o.   MRN: [id number]

## 2019-07-10 NOTE — Progress Notes (Signed)
PROGRESS NOTE    [name] [name]  [id number] DOB: 11/08/1932 DOA: 07/05/2019 PCP: Cassandria Anger, MD   Brief Narrative: 85 year old lady with prior h/o NICM with recent echo showing LVEF of 20 to 25%,persistent atrial fib failed cardioversion , hypertension, diverticulosis, hyperlipidemia, systolic and diastolic heart failure, presents with nausea, vomiting and was found to have SBO. NG TUBE was attempted but couldn't be placed, it was finally placed via fluoro guided but pt removed it. Surgery on board. In view her h/o of NICM, and persistent atrial fibrillation with tachy brady in the last 24 hours, she was started on IV metoprolol and cardiology consulted for recommendations. She is also started on IV heparin, in case she requires surgery.  After further discussion with family and the patient, surgery was able to put the NG tube and more than 1000 ml of bilious fluid aspirated. She continues to have the NG tube and on intermittent suction.  Patient reports she is feeling better tha yesterday. abd x ray is pending this morning.    Assessment & Plan:   Principal Problem:   SBO (small bowel obstruction) (HCC) Active Problems:   Essential hypertension   Nausea and vomiting   Protein-calorie malnutrition, severe   Small bowel obstruction Slight improvement in small bowel obstruction on repeated films.   But pt continues to have nausea, and abd discomfort has improved but  Not resolved, still no flatulence and no BM yet. Repeat abd film is pending. Meanwhile continue with NG tube suction and appreciate surgery recommendations.     History of nonischemic cardiomyopathy She appears to be euvolemic she denies any chest pain or shortness of breath. Cardiology consulted recommended to start oral metoprolol and Lasix when her bowel obstruction improves.  Continue with IV metoprolol and as needed Lasix.   Persistent atrial fibrillation rate slightly high today and 120s per minute.  Use IV metoprolol for rates greater than 130/min Appreciate cardiology recommendations.  Recent echo showing LVEF Of 20% to 25%.  Continue IV heparin for anticoagulation.    Hypertension:   Better control today.  Nausea and vomiting : Persistent nausea,but better than yesterday.  No vomiting.    Mild AKI:  Resolved with hydration.   Nutrition Patient is on TPN  Hypophosphatemia:  Replaced earlier this morning.  DVT prophylaxis: heparin.  Code Status: full code.  Family Communication: none at bedside.  Discussed with the son and daughter over the phone and son at bedside ON 11/6 Disposition Plan: Pending improvement in the SBO.   Consultants:   Surgery.   Cardiology SIGNED OFF .   Procedures: fluoro guided NG placement.   Antimicrobials: none.   Subjective:   Patient continues to have nausea and some abdominal discomfort but better than yesterday.  She denies any chest pain or shortness of breath.  Objective: Vitals:   07/09/19 1535 07/09/19 1744 07/09/19 2105 07/10/19 0450  BP: (!) 147/81 (!) 192/88 (!) 144/86 (!) 132/93  Pulse: 96 (!) 105 91 (!) 59  Resp: 20 20 18 17   Temp: (!) 97.4 F (36.3 C) 97.8 F (36.6 C) 97.7 F (36.5 C) 98.7 F (37.1 C)  TempSrc: Axillary Axillary  Oral  SpO2: 98% 98% 95% 98%  Weight:    76.8 kg  Height:        Intake/Output Summary (Last 24 hours) at 07/10/2019 1441 Last data filed at 07/10/2019 0631 Gross per 24 hour  Intake 1354.66 ml  Output 650 ml  Net 704.66 ml  Filed Weights   07/08/19 0228 07/08/19 0500 07/10/19 0450  Weight: 82.4 kg 78.4 kg 76.8 kg    Examination:  General exam: Alert and has NG tube connected to intermittent suction. Respiratory system: Diminished air entry at bases, no wheezing or rhonchi.  Cardiovascular system: S1-S2 heard, irregular, no JVD Gastrointestinal system: Abdomen is distended, mildly tender generalized, bowel sounds are heard, no signs of peritonitis Central nervous  system: Alert and oriented Extremities: No cyanosis or clubbing Skin: No rashes seen Psychiatry: Mood is appropriate    Data Reviewed: I have personally reviewed following labs and imaging studies  CBC: Recent Labs  Lab 07/06/19 0426 07/07/19 0920 07/08/19 0357 07/09/19 0344 07/10/19 0342  WBC 6.6 5.7 5.3 4.5 6.1  NEUTROABS  --   --   --  2.1  --   HGB 14.8 14.9 14.8 13.4 14.2  HCT 45.5 46.7* 45.2 41.7 43.6  MCV 87.8 90.5 88.3 88.9 88.6  PLT 185 185 181 175 [id number]   Basic Metabolic Panel: Recent Labs  Lab 07/06/19 0426 07/06/19 0440 07/07/19 0920 07/08/19 0357 07/09/19 0344 07/10/19 0342  NA 139  --  139 138 141 140  K 3.9  --  4.6 4.4 3.8 3.6  CL 101  --  99 100 102 103  CO2 25  --  27 23 29 27   GLUCOSE 134*  --  132* 116* 128* 129*  BUN 24*  --  34* 43* 38* 31*  CREATININE 1.36*  --  1.77* 1.66* 1.15* 0.87  CALCIUM 9.5  --  9.7 9.3 9.2 9.3  MG  --  1.8  --  2.1 2.3 2.0  PHOS  --   --   --  3.3 1.8* 2.4*   GFR: Estimated Creatinine Clearance: 46.2 mL/min (by C-G formula based on SCr of 0.87 mg/dL). Liver Function Tests: Recent Labs  Lab 07/05/19 1822 07/06/19 0426 07/09/19 0344  AST 30 25 23   ALT 22 20 20   ALKPHOS 47 40 36*  BILITOT 1.1 1.0 0.7  PROT 7.5 6.8 6.3*  ALBUMIN 4.1 3.7 3.3*   Recent Labs  Lab 07/05/19 1822  LIPASE 39   No results for input(s): AMMONIA in the last 168 hours. Coagulation Profile: No results for input(s): INR, PROTIME in the last 168 hours. Cardiac Enzymes: No results for input(s): CKTOTAL, CKMB, CKMBINDEX, TROPONINI in the last 168 hours. BNP (last 3 results) No results for input(s): PROBNP in the last 8760 hours. HbA1C: No results for input(s): HGBA1C in the last 72 hours. CBG: Recent Labs  Lab 07/09/19 1608 07/09/19 2034 07/10/19 0007 07/10/19 0446 07/10/19 0754  GLUCAP 129* 114* 137* 129* 127*   Lipid Profile: Recent Labs    07/09/19 0344  TRIG 88   Thyroid Function Tests: No results for input(s):  TSH, T4TOTAL, FREET4, T3FREE, THYROIDAB in the last 72 hours. Anemia Panel: No results for input(s): VITAMINB12, FOLATE, FERRITIN, TIBC, IRON, RETICCTPCT in the last 72 hours. Sepsis Labs: No results for input(s): PROCALCITON, LATICACIDVEN in the last 168 hours.  Recent Results (from the past 240 hour(s))  SARS Coronavirus 2 by RT PCR (hospital order, performed in [facility] hospital lab) Nasopharyngeal Nasopharyngeal Swab     Status: None   Collection Time: 07/05/19  9:34 PM   Specimen: Nasopharyngeal Swab  Result Value Ref Range Status   SARS Coronavirus 2 NEGATIVE NEGATIVE Final    Comment: (NOTE) If result is NEGATIVE SARS-CoV-2 target nucleic acids are NOT DETECTED. The SARS-CoV-2 RNA is generally detectable in  upper and lower  respiratory specimens during the acute phase of infection. The lowest  concentration of SARS-CoV-2 viral copies this assay can detect is 250  copies / mL. A negative result does not preclude SARS-CoV-2 infection  and should not be used as the sole basis for treatment or other  patient management decisions.  A negative result may occur with  improper specimen collection / handling, submission of specimen other  than nasopharyngeal swab, presence of viral mutation(s) within the  areas targeted by this assay, and inadequate number of viral copies  (<250 copies / mL). A negative result must be combined with clinical  observations, patient history, and epidemiological information. If result is POSITIVE SARS-CoV-2 target nucleic acids are DETECTED. The SARS-CoV-2 RNA is generally detectable in upper and lower  respiratory specimens dur ing the acute phase of infection.  Positive  results are indicative of active infection with SARS-CoV-2.  Clinical  correlation with patient history and other diagnostic information is  necessary to determine patient infection status.  Positive results do  not rule out bacterial infection or co-infection with other viruses. If  result is PRESUMPTIVE POSTIVE SARS-CoV-2 nucleic acids MAY BE PRESENT.   A presumptive positive result was obtained on the submitted specimen  and confirmed on repeat testing.  While 2019 novel coronavirus  (SARS-CoV-2) nucleic acids may be present in the submitted sample  additional confirmatory testing may be necessary for epidemiological  and / or clinical management purposes  to differentiate between  SARS-CoV-2 and other Sarbecovirus currently known to infect humans.  If clinically indicated additional testing with an alternate test  methodology [phone]) is advised. The SARS-CoV-2 RNA is generally  detectable in upper and lower respiratory sp ecimens during the acute  phase of infection. The expected result is Negative. Fact Sheet for Patients:  StrictlyIdeas.no Fact Sheet for Healthcare Providers: BankingDealers.co.za This test is not yet approved or cleared by the [place] FDA and has been authorized for detection and/or diagnosis of SARS-CoV-2 by FDA under an Emergency Use Authorization (EUA).  This EUA will remain in effect (meaning this test can be used) for the duration of the COVID-19 declaration under Section 564(b)(1) of the Act, 21 U.S.C. section 360bbb-3(b)(1), unless the authorization is terminated or revoked sooner. Performed at [facility], [address], [place], [place] [address]          Radiology Studies: Dg Abd 1 View  Result Date: 07/09/2019 CLINICAL DATA:  Small bowel obstruction. EXAM: ABDOMEN - 1 VIEW COMPARISON:  Abdominal radiograph 07/08/2019 FINDINGS: NG tube tip and side-port project over the gastric antrum/proximal duodenum. Re demonstrated gaseous distended loops of small bowel within the abdomen, similar to mildly decreased from prior. Small amount of high density within the cecum and ascending colon favored represent oral contrast. Lumbar spine degenerative changes. IMPRESSION:  Similar to mildly improved gaseous distended loops of small bowel within the central abdomen. Electronically Signed   By: Lovey Newcomer M.D.   On: 07/09/2019 09:23   Dg Chest Port 1 View  Result Date: 07/08/2019 CLINICAL DATA:  PICC line placement EXAM: PORTABLE CHEST 1 VIEW COMPARISON:  04/18/2019 FINDINGS: Esophageal tube tip is below the diaphragm but non included. Right upper extremity catheter tip superimposes the distal SVC. Cardiomegaly with central vascular congestion. Aortic atherosclerosis. No pneumothorax. IMPRESSION: 1. Right upper extremity catheter tip superimposes the distal SVC. 2. Cardiomegaly Electronically Signed   By: Donavan Foil M.D.   On: 07/08/2019 17:26  Scheduled Meds: . Chlorhexidine Gluconate Cloth  6 each Topical Daily  . diphenhydrAMINE  12.5 mg Intravenous QPM  . mouth rinse  15 mL Mouth Rinse BID  . pantoprazole (PROTONIX) IV  40 mg Intravenous QHS   Continuous Infusions: . sodium chloride 250 mL (07/10/19 0958)  . heparin 900 Units/hr (07/10/19 0329)  . potassium chloride    . potassium PHOSPHATE IVPB (in mmol) 10 mmol (07/10/19 1218)  . TPN ADULT (ION) 65 mL/hr at 07/10/19 0329  . TPN ADULT (ION)       LOS: 5 days       Hosie Poisson, MD [facility] Pager [phone]  If 7PM-7AM, please contact night-coverage [email or web address] Password [facility] 07/10/2019, 2:41 PM

## 2019-07-10 NOTE — Progress Notes (Signed)
Patient reports she, "feels much better today than yesterday."  Patient has "been able to rest this afternoon" - per patient.  Will continue to monitor.

## 2019-07-10 NOTE — Progress Notes (Signed)
[place] NOTE   Pharmacy Consult for TPN Indication: SBP  Patient Measurements: Height: 5\' 2"  (157.5 cm) Weight: 169 lb 5 oz (76.8 kg) IBW/kg (Calculated) : 50.1 TPN AdjBW (KG): 56.7 Body mass index is 30.97 kg/m. Usual Weight:   Current Nutrition: NPO  IVF: NS at 50 ml/hr-  11/6 order to decr to 10 ml/hr by Rph increased back to 50 ml/hr by Akula 11/6 due to [employer] access: PICC placed 11/6 for TPN TPN start date: 07/08/2019  ASSESSMENT  -SBO likely due to adhesions                                                                                                HPI: CKD, CHF, NICM EF 20-25%, Afib, HTN, HLD, Hx H pylori gastritis, diverticulosis  Significant events: 11/5 NGT placed by IR via fluoro but pt insisted it be removed 11/6 pt agrees to have IR place NGT via fluoro, PICC/TPN ordered   Today:   Glucose: no hx DM, all CBGs < 150 with TPN at goal rate, will DC SSI/CBGs 11/8  Electrolytes: hx Afib so goal K> = 4, Mag >=2; home lasix & Kdur have been held; K 3.6 ( after 29 meq yest) , mag 2.0, phos 2.4 after 20 mM kphos yesterday  Renal: hx CKD, AKI on admit has resolved. SCr 1.2-1.57 04/2019, now down to 0.86  NICM EF 20-25%, currently not volume overloaded, home lasix held  NGO 3600 mls/24hr on 11/6>> 900 mls/24 hrs 11/7,   LFTs: WNL on admit, 11/7: alb 3.3,  TGs: on pravachol PTA, tirg 88 11/7  Prealbumin: 20.1 11/7 - WNL  NUTRITIONAL GOALS                                                                                             RD recs: 07/08/2019 Kcal:  1550-1750 Protein:  75-90 grams Fluid:  >/= 1.5 L  Custom TPN at goal rate of 65 ml/hr provides: - 85.8  g/day protein (55 g/L) - 46.8  g/day Lipid  (30 g/L) - 249.6  g/day Dextrose (16 %) -  1659.84 Kcal/day  PLAN       Now: Know 10 mMol (~ 15 meq K) plus 2 runs of KCL  At 1800 today:  continue TPN at goal rate of 65 ml/hr. -  Provides 85.8  g of protein, and  provides ~ 1660 kCals per day.  Meeting 100% of goals,   electrolytes in TPN: Standard x incr K, Cl:Ac ratio 1:1  TPN to contain standard multivitamins and trace elements.  Per D/w MD 11/7, no IVF  DC SSI/CBGs  TPN lab panels on Mondays & Thursdays  F/u daily.  Eudelia Bunch, Pharm.D [phone] 07/10/2019 8:26 AM

## 2019-07-11 ENCOUNTER — Inpatient Hospital Stay (HOSPITAL_COMMUNITY): Payer: Medicare Other

## 2019-07-11 LAB — DIFFERENTIAL
Abs Immature Granulocytes: 0.02 10*3/uL (ref 0.00–0.07)
Basophils Absolute: 0 10*3/uL (ref 0.0–0.1)
Basophils Relative: 0 %
Eosinophils Absolute: 0.2 10*3/uL (ref 0.0–0.5)
Eosinophils Relative: 3 %
Immature Granulocytes: 0 %
Lymphocytes Relative: 26 %
Lymphs Abs: 2.1 10*3/uL (ref 0.7–4.0)
Monocytes Absolute: 1 10*3/uL (ref 0.1–1.0)
Monocytes Relative: 13 %
Neutro Abs: 4.5 10*3/uL (ref 1.7–7.7)
Neutrophils Relative %: 58 %

## 2019-07-11 LAB — COMPREHENSIVE METABOLIC PANEL
ALT: 18 U/L (ref 0–44)
AST: 22 U/L (ref 15–41)
Albumin: 3.1 g/dL — ABNORMAL LOW (ref 3.5–5.0)
Alkaline Phosphatase: 40 U/L (ref 38–126)
Anion gap: 7 (ref 5–15)
BUN: 37 mg/dL — ABNORMAL HIGH (ref 8–23)
CO2: 28 mmol/L (ref 22–32)
Calcium: 9.3 mg/dL (ref 8.9–10.3)
Chloride: 105 mmol/L (ref 98–111)
Creatinine, Ser: 0.89 mg/dL (ref 0.44–1.00)
GFR calc Af Amer: >60 mL/min (ref >60)
GFR calc non Af Amer: 60 mL/min — ABNORMAL LOW (ref >60)
Glucose, Bld: 116 mg/dL — ABNORMAL HIGH (ref 70–99)
Potassium: 4.4 mmol/L (ref 3.5–5.1)
Sodium: 140 mmol/L (ref 135–145)
Total Bilirubin: 0.8 mg/dL (ref 0.3–1.2)
Total Protein: 6.2 g/dL — ABNORMAL LOW (ref 6.5–8.1)

## 2019-07-11 LAB — CBC
HCT: 42.1 % (ref 36.0–46.0)
Hemoglobin: 13.5 g/dL (ref 12.0–15.0)
MCH: 28.7 pg (ref 26.0–34.0)
MCHC: 32.1 g/dL (ref 30.0–36.0)
MCV: 89.4 fL (ref 80.0–100.0)
Platelets: 159 10*3/uL (ref 150–400)
RBC: 4.71 MIL/uL (ref 3.87–5.11)
RDW: 16.8 % — ABNORMAL HIGH (ref 11.5–15.5)
WBC: 7.8 10*3/uL (ref 4.0–10.5)
nRBC: 0 % (ref 0.0–0.2)

## 2019-07-11 LAB — PREALBUMIN: Prealbumin: 16.1 mg/dL — ABNORMAL LOW (ref 18–38)

## 2019-07-11 LAB — HEPARIN LEVEL (UNFRACTIONATED): Heparin Unfractionated: 0.61 IU/mL (ref 0.30–0.70)

## 2019-07-11 LAB — MAGNESIUM: Magnesium: 2.1 mg/dL (ref 1.7–2.4)

## 2019-07-11 LAB — TRIGLYCERIDES: Triglycerides: 54 mg/dL (ref <150)

## 2019-07-11 LAB — PHOSPHORUS: Phosphorus: 3.5 mg/dL (ref 2.5–4.6)

## 2019-07-11 MED ORDER — BISACODYL 10 MG RE SUPP
10.0000 mg | Freq: Once | RECTAL | Status: AC
  Administered 2019-07-11: 10 mg via RECTAL
  Filled 2019-07-11 (×3): qty 1

## 2019-07-11 MED ORDER — HYDROMORPHONE HCL 1 MG/ML IJ SOLN
0.5000 mg | INTRAMUSCULAR | Status: DC | PRN
  Administered 2019-07-11 – 2019-07-14 (×6): 0.5 mg via INTRAVENOUS
  Filled 2019-07-11 (×6): qty 0.5

## 2019-07-11 MED ORDER — FUROSEMIDE 10 MG/ML IJ SOLN
40.0000 mg | Freq: Once | INTRAMUSCULAR | Status: AC
  Administered 2019-07-11: 40 mg via INTRAVENOUS
  Filled 2019-07-11: qty 4

## 2019-07-11 MED ORDER — TRAVASOL 10 % IV SOLN
INTRAVENOUS | Status: AC
  Administered 2019-07-11: 18:00:00 via INTRAVENOUS
  Filled 2019-07-11: qty 858

## 2019-07-11 MED ORDER — HEPARIN (PORCINE) 25000 UT/250ML-% IV SOLN
950.0000 [IU]/h | INTRAVENOUS | Status: AC
  Administered 2019-07-11 – 2019-07-12 (×2): 850 [IU]/h via INTRAVENOUS
  Filled 2019-07-11 (×2): qty 250

## 2019-07-11 NOTE — Progress Notes (Signed)
Patient reports that she "can't breathe". O2 stats shows 96 on Room Air, some fine crackles with rhonchi heard bilaterally on lung sounds. Pt appeared slightly anxious. PRN ATIVAN given and on-call notified. On-call provided new orders. Orders were carried out. Will continue to monitor.

## 2019-07-11 NOTE — Progress Notes (Signed)
[facility] Progress Note     Subjective: CC: sbo Patient had a small BM yesterday AM but has had no further bowel function since. Denies abdominal pain or nausea at this time. Per RN she had some nausea earlier this AM and this improved with antiemetic. Discussed with patient that if she does not improve with conservative management that I would recommend surgical intervention but patient insists that she would not want this and that she understands this decision.   Objective: Vital signs in last 24 hours: Temp:  [97.8 F (36.6 C)-98.6 F (37 C)] 97.8 F (36.6 C) (11/09 0452) Pulse Rate:  [76-119] 108 (11/09 0452) Resp:  [18] 18 (11/09 0452) BP: (107-145)/(79-121) 127/80 (11/09 0452) SpO2:  [98 %-99 %] 99 % (11/09 0452) Weight:  [76.3 kg] 76.3 kg (11/09 0452) Last BM Date: 07/09/19  Intake/Output from previous day: 11/08 0701 - 11/09 0700 In: 1181.7 [P.O.:75; I.V.:873.6; IV Piggyback:233.1] Out: 960 [Emesis/NG output:960] Intake/Output this shift: No intake/output data recorded.  PE: Gen:  Alert, NAD, pleasant Card:  Regular rate and rhythm, pedal pulses 2+ BL Pulm:  Normal effort, clear to auscultation bilaterally Abd: Soft, non-tender, mildly distended, BS hypoactive, NGT present with bilious drainage Skin: warm and dry, no rashes  Psych: A&Ox3   Lab Results:  Recent Labs    07/10/19 0342 07/11/19 0401  WBC 6.1 7.8  HGB 14.2 13.5  HCT 43.6 42.1  PLT 161 159   BMET Recent Labs    07/10/19 0342 07/11/19 0401  NA 140 140  K 3.6 4.4  CL 103 105  CO2 27 28  GLUCOSE 129* 116*  BUN 31* 37*  CREATININE 0.87 0.89  CALCIUM 9.3 9.3   PT/INR No results for input(s): LABPROT, INR in the last 72 hours. CMP     Component Value Date/Time   NA 140 07/11/2019 0401   NA 143 08/30/2018 0825   NA 141 11/05/2015 1429   K 4.4 07/11/2019 0401   K 3.8 11/05/2015 1429   CL 105 07/11/2019 0401   CL 102 05/13/2012 1021   CO2 28 07/11/2019 0401   CO2 28  11/05/2015 1429   GLUCOSE 116 (H) 07/11/2019 0401   GLUCOSE 117 11/05/2015 1429   GLUCOSE 95 05/13/2012 1021   BUN 37 (H) 07/11/2019 0401   BUN 12 08/30/2018 0825   BUN 13.8 11/05/2015 1429   CREATININE 0.89 07/11/2019 0401   CREATININE 0.9 11/05/2015 1429   CALCIUM 9.3 07/11/2019 0401   CALCIUM 9.8 11/05/2015 1429   PROT 6.2 (L) 07/11/2019 0401   PROT 7.2 11/05/2015 1429   ALBUMIN 3.1 (L) 07/11/2019 0401   ALBUMIN 3.4 (L) 11/05/2015 1429   AST 22 07/11/2019 0401   AST 13 11/05/2015 1429   ALT 18 07/11/2019 0401   ALT 14 11/05/2015 1429   ALKPHOS 40 07/11/2019 0401   ALKPHOS 68 11/05/2015 1429   BILITOT 0.8 07/11/2019 0401   BILITOT <0.30 11/05/2015 1429   GFRNONAA 60 (L) 07/11/2019 0401   GFRAA >60 07/11/2019 0401   Lipase     Component Value Date/Time   LIPASE 39 07/05/2019 1822       Studies/Results: Dg Abd 2 Views  Result Date: 07/10/2019 CLINICAL DATA:  Small bowel obstruction - likely due to adhesions per MD notes in chart. Pt c/o abdominal pain, nausea, and vomiting as well. H/o diverticulosis of colon. EXAM: ABDOMEN - 2 VIEW COMPARISON:  Abdominal radiograph 07/09/2019 FINDINGS: Persistent multiple dilated loops of small bowel concerning for obstruction.  Oral contrast is seen to the level the rectum. No evidence of free air. The enteric tube side port projects likely at the gastric antrum. IMPRESSION: 1. Persistent multiple dilated loops of small bowel concerning for obstruction. Oral contrast is present in the distal colon and rectum. 2. Enteric tube side port projects at the gastric antrum. Electronically Signed   By: Audie Pinto M.D.   On: 07/10/2019 14:57    Anti-infectives: Anti-infectives (From admission, onward)   None       Assessment/Plan CKD CHF/nonischemic cardiomyopathy with EF 20 to 25%- cardiology clearance in notes Atrial fibrillation/failed cardioversion on apixaban- currently heparin gtts Mild troponin  elevation Hypertension Hyperlipidemia Hx H. pylori gastritis Weight loss  Small bowel obstruction- likely due to adhesions -Abdominal film yesterday with persistent dilated small bowel, repeat film this AM - small BM yesterday, nothing since, no flatus -encouraged ambulation, OOB - discussed with patient surgery vs no surgery and what that could mean if she fails to improve - patient is adamant that she would not want surgery for this if she does not get better - continue NPO, IVF; allow ice chips, sips  [id number] sips and chips ID: None DVT: Heparingtts Follow-up: To be determined  LOS: 6 days    Brigid Re , [facility] 07/11/2019, 9:31 AM Please see Amion for pager number during day hours 7:00am-4:30pm

## 2019-07-11 NOTE — Evaluation (Signed)
Physical Therapy Evaluation Patient Details Name: [name] [name] MRN: [id number] DOB: [birth year] Today's Date: 07/11/2019   History of Present Illness  85 yo female admitted with SBO. Hx of CHF, CKD, A fib, breast Ca.  Clinical Impression  On eval, pt required Min assist for mobility. She walked ~100 feet while holding on to the IV pole for support. She tolerated activity fairly well. Will follow and progress activity as tolerated. Recommend daily ambulation in hallway with nursing assisting/supervision.     Follow Up Recommendations Home health PT;Supervision - Intermittent    Equipment Recommendations  None recommended by PT    Recommendations for Other Services       Precautions / Restrictions Precautions Precautions: Fall Precaution Comments: NG tube Restrictions Weight Bearing Restrictions: No      Mobility  Bed Mobility Overal bed mobility: Needs Assistance Bed Mobility: Supine to Sit     Supine to sit: Min guard;HOB elevated     General bed mobility comments: for safety, lines  Transfers Overall transfer level: Needs assistance Equipment used: None Transfers: Sit to/from Stand Sit to Stand: Min assist         General transfer comment: Assist to rise, steady, control descent. Increased time.  Ambulation/Gait Ambulation/Gait assistance: Min assist Gait Distance (Feet): 100 Feet Assistive device: IV Pole Gait Pattern/deviations: Step-through pattern;Decreased stride length     General Gait Details: Slow gait speed. Pt relied on IV pole with 2 hands. Assist to stabilize throughout distance.  Stairs            Wheelchair Mobility    Modified Rankin (Stroke Patients Only)       Balance Overall balance assessment: Needs assistance           Standing balance-Leahy Scale: Fair                               Pertinent Vitals/Pain Pain Assessment: No/denies pain    Home Living Family/patient expects to be discharged to::  Private residence   Available Help at Discharge: Family;Available PRN/intermittently Type of Home: House Home Access: Level entry     Home Layout: One level Home Equipment: Cane - single point;Bedside commode      Prior Function Level of Independence: Independent with assistive device(s)         Comments: walks with cane at all times     Hand Dominance   Dominant Hand: Right    Extremity/Trunk Assessment   Upper Extremity Assessment Upper Extremity Assessment: Generalized weakness    Lower Extremity Assessment Lower Extremity Assessment: Generalized weakness    Cervical / Trunk Assessment Cervical / Trunk Assessment: Normal  Communication   Communication: No difficulties  Cognition Arousal/Alertness: Awake/alert Behavior During Therapy: WFL for tasks assessed/performed Overall Cognitive Status: Within Functional Limits for tasks assessed                                        General Comments      Exercises     Assessment/Plan    PT Assessment Patient needs continued PT services  PT Problem List Decreased strength;Decreased mobility;Decreased balance;Decreased activity tolerance       PT Treatment Interventions DME instruction;Gait training;Therapeutic exercise;Therapeutic activities;Patient/family education;Functional mobility training    PT Goals (Current goals can be found in the Care Plan section)  Acute Rehab PT Goals Patient Stated  Goal: to get better PT Goal Formulation: With patient Time For Goal Achievement: 07/25/19 Potential to Achieve Goals: Good    Frequency Min 3X/week   Barriers to discharge        Co-evaluation               AM-PAC PT "6 Clicks" Mobility  Outcome Measure Help needed turning from your back to your side while in a flat bed without using bedrails?: A Little Help needed moving from lying on your back to sitting on the side of a flat bed without using bedrails?: A Little Help needed moving  to and from a bed to a chair (including a wheelchair)?: A Little Help needed standing up from a chair using your arms (e.g., wheelchair or bedside chair)?: A Little Help needed to walk in hospital room?: A Little Help needed climbing 3-5 steps with a railing? : A Little 6 Click Score: 18    End of Session Equipment Utilized During Treatment: Gait belt Activity Tolerance: Patient tolerated treatment well Patient left: in bed;with call bell/phone within reach;with bed alarm set   PT Visit Diagnosis: Unsteadiness on feet (R26.81);Muscle weakness (generalized) (M62.81);Difficulty in walking, not elsewhere classified (R26.2)    Time: 1030-1050 PT Time Calculation (min) (ACUTE ONLY): 20 min   Charges:   PT Evaluation $PT Eval Moderate Complexity: [place], PT Acute Rehabilitation Services Pager: [phone] Office: [phone]

## 2019-07-11 NOTE — Progress Notes (Addendum)
PROGRESS NOTE    [name] [name]  [id number] DOB: April 27, 1933 DOA: 07/05/2019 PCP: Cassandria Anger, MD   Brief Narrative: 85 year old lady with prior h/o NICM with recent echo showing LVEF of 20 to 25%,persistent atrial fib failed cardioversion , hypertension, diverticulosis, hyperlipidemia, systolic and diastolic heart failure, presents with nausea, vomiting and was found to have SBO. NG TUBE was attempted but couldn't be placed, it was finally placed via fluoro guided but pt removed it. Surgery on board. In view her h/o of NICM, and persistent atrial fibrillation with tachy brady in the last 24 hours, she was started on IV metoprolol and cardiology consulted for recommendations. She is also started on IV heparin, in case she requires surgery.  After further discussion with family and the patient, surgery was able to put the NG tube and more than 1000 ml of bilious fluid aspirated. She continues to have the NG tube connected to intermittent suction. Patient had a small bowel movement yesterday and abdominal film shows persistent dilated small bowel loops concerning for small bowel obstruction.   Assessment & Plan:   Principal Problem:   SBO (small bowel obstruction) (HCC) Active Problems:   Essential hypertension   Nausea and vomiting   Protein-calorie malnutrition, severe   Small bowel obstruction May be slight improvement yesterday but after that patient denies she has persistent nause no flatulence. She reports having a small bowel movement yesterday.  But she continues to have nausea and abdominal discomfort.  Abdominal films this afternoon shows persistent dilated small bowel loops concerning for small bowel obstruction.  Surgery discussed conservative versus surgical management and patient at this time refusing any kind of surgical intervention even if she does not improve. Meanwhile continue with the gentle hydration and replete electrolytes as appropriate.    History of  nonischemic cardiomyopathy She continues to be euvolemic denies any chest pain or shortness of breath at this time. Cardiology consulted recommended to start oral metoprolol and Lasix when her bowel obstruction improves.  Continue with as needed metoprolol.   Persistent atrial fibrillation  Rate controlled, use as needed metoprolol for rates greater than 130/min Appreciate cardiology recommendations.  Recent echo showing LVEF Of 20% to 25%.  Continue with IV heparin for anticoagulation    Hypertension:  Well-controlled blood pressure parameters  Nausea and vomiting : No vomiting but persistent nausea.  Continue with symptomatic management with antiemetics.   Mild AKI:  Resolved with hydration.   Nutrition Patient is on TPN  Hypophosphatemia:  Replaced earlier this morning.  DVT prophylaxis: heparin.  Code Status: full code.  Family Communication: none at bedside.  Discussed with the son and daughter over the phone and son at bedside ON 11/6 Disposition Plan: Pending improvement in the SBO.   Consultants:   Surgery.   Cardiology SIGNED OFF .   Procedures: fluoro guided NG placement.   Antimicrobials: none.   Subjective:  Patient continues to have nausea and abdominal discomfort.  She does not have any flatulence. Small bowel movement yesterday.  She denies any chest pain or shortness of breath.  Objective: Vitals:   07/10/19 1453 07/10/19 1537 07/10/19 2045 07/11/19 0452  BP: (!) 145/121 107/79 108/90 127/80  Pulse: (!) 119 95 76 (!) 108  Resp: 18  18 18   Temp: 98.6 F (37 C)  98.2 F (36.8 C) 97.8 F (36.6 C)  TempSrc: Oral  Oral Oral  SpO2: 98%  99% 99%  Weight:    76.3 kg  Height:  Intake/Output Summary (Last 24 hours) at 07/11/2019 1424 Last data filed at 07/11/2019 1400 Gross per 24 hour  Intake 1861.53 ml  Output 960 ml  Net 901.53 ml   Filed Weights   07/08/19 0500 07/10/19 0450 07/11/19 0452  Weight: 78.4 kg 76.8 kg 76.3 kg     Examination:  General exam: Mild distress from the NG tube connected to intermittent suction. Respiratory system: Diminished air entry at bases, no wheezing or rhonchi Cardiovascular system: S1-S2 heard, irregular, tachycardic, no JVD Gastrointestinal system: Abdomen is soft, generalized tenderness present, no signs of peritonitis, bowel sounds minimal Central nervous system: Alert and answering questions appropriately Extremities: Cyanosis or clubbing Skin: No rashes seen Psychiatry: Mood is appropriate   Data Reviewed: I have personally reviewed following labs and imaging studies  CBC: Recent Labs  Lab 07/07/19 0920 07/08/19 0357 07/09/19 0344 07/10/19 0342 07/11/19 0401  WBC 5.7 5.3 4.5 6.1 7.8  NEUTROABS  --   --  2.1  --  4.5  HGB 14.9 14.8 13.4 14.2 13.5  HCT 46.7* 45.2 41.7 43.6 42.1  MCV 90.5 88.3 88.9 88.6 89.4  PLT 185 181 175 161 [id number]   Basic Metabolic Panel: Recent Labs  Lab 07/06/19 0440 07/07/19 0920 07/08/19 0357 07/09/19 0344 07/10/19 0342 07/11/19 0401  NA  --  139 138 141 140 140  K  --  4.6 4.4 3.8 3.6 4.4  CL  --  99 100 102 103 105  CO2  --  27 23 29 27 28   GLUCOSE  --  132* 116* 128* 129* 116*  BUN  --  34* 43* 38* 31* 37*  CREATININE  --  1.77* 1.66* 1.15* 0.87 0.89  CALCIUM  --  9.7 9.3 9.2 9.3 9.3  MG 1.8  --  2.1 2.3 2.0 2.1  PHOS  --   --  3.3 1.8* 2.4* 3.5   GFR: Estimated Creatinine Clearance: 45 mL/min (by C-G formula based on SCr of 0.89 mg/dL). Liver Function Tests: Recent Labs  Lab 07/05/19 1822 07/06/19 0426 07/09/19 0344 07/11/19 0401  AST 30 25 23 22   ALT 22 20 20 18   ALKPHOS 47 40 36* 40  BILITOT 1.1 1.0 0.7 0.8  PROT 7.5 6.8 6.3* 6.2*  ALBUMIN 4.1 3.7 3.3* 3.1*   Recent Labs  Lab 07/05/19 1822  LIPASE 39   No results for input(s): AMMONIA in the last 168 hours. Coagulation Profile: No results for input(s): INR, PROTIME in the last 168 hours. Cardiac Enzymes: No results for input(s): CKTOTAL, CKMB,  CKMBINDEX, TROPONINI in the last 168 hours. BNP (last 3 results) No results for input(s): PROBNP in the last 8760 hours. HbA1C: No results for input(s): HGBA1C in the last 72 hours. CBG: Recent Labs  Lab 07/09/19 1608 07/09/19 2034 07/10/19 0007 07/10/19 0446 07/10/19 0754  GLUCAP 129* 114* 137* 129* 127*   Lipid Profile: Recent Labs    07/09/19 0344 07/11/19 0401  TRIG 88 54   Thyroid Function Tests: No results for input(s): TSH, T4TOTAL, FREET4, T3FREE, THYROIDAB in the last 72 hours. Anemia Panel: No results for input(s): VITAMINB12, FOLATE, FERRITIN, TIBC, IRON, RETICCTPCT in the last 72 hours. Sepsis Labs: No results for input(s): PROCALCITON, LATICACIDVEN in the last 168 hours.  Recent Results (from the past 240 hour(s))  SARS Coronavirus 2 by RT PCR (hospital order, performed in [facility] hospital lab) Nasopharyngeal Nasopharyngeal Swab     Status: None   Collection Time: 07/05/19  9:34 PM   Specimen: Nasopharyngeal Swab  Result Value Ref Range Status   SARS Coronavirus 2 NEGATIVE NEGATIVE Final    Comment: (NOTE) If result is NEGATIVE SARS-CoV-2 target nucleic acids are NOT DETECTED. The SARS-CoV-2 RNA is generally detectable in upper and lower  respiratory specimens during the acute phase of infection. The lowest  concentration of SARS-CoV-2 viral copies this assay can detect is 250  copies / mL. A negative result does not preclude SARS-CoV-2 infection  and should not be used as the sole basis for treatment or other  patient management decisions.  A negative result may occur with  improper specimen collection / handling, submission of specimen other  than nasopharyngeal swab, presence of viral mutation(s) within the  areas targeted by this assay, and inadequate number of viral copies  (<250 copies / mL). A negative result must be combined with clinical  observations, patient history, and epidemiological information. If result is POSITIVE SARS-CoV-2 target  nucleic acids are DETECTED. The SARS-CoV-2 RNA is generally detectable in upper and lower  respiratory specimens dur ing the acute phase of infection.  Positive  results are indicative of active infection with SARS-CoV-2.  Clinical  correlation with patient history and other diagnostic information is  necessary to determine patient infection status.  Positive results do  not rule out bacterial infection or co-infection with other viruses. If result is PRESUMPTIVE POSTIVE SARS-CoV-2 nucleic acids MAY BE PRESENT.   A presumptive positive result was obtained on the submitted specimen  and confirmed on repeat testing.  While 2019 novel coronavirus  (SARS-CoV-2) nucleic acids may be present in the submitted sample  additional confirmatory testing may be necessary for epidemiological  and / or clinical management purposes  to differentiate between  SARS-CoV-2 and other Sarbecovirus currently known to infect humans.  If clinically indicated additional testing with an alternate test  methodology [id number]) is advised. The SARS-CoV-2 RNA is generally  detectable in upper and lower respiratory sp ecimens during the acute  phase of infection. The expected result is Negative. Fact Sheet for Patients:  StrictlyIdeas.no Fact Sheet for Healthcare Providers: BankingDealers.co.za This test is not yet approved or cleared by the [place] FDA and has been authorized for detection and/or diagnosis of SARS-CoV-2 by FDA under an Emergency Use Authorization (EUA).  This EUA will remain in effect (meaning this test can be used) for the duration of the COVID-19 declaration under Section 564(b)(1) of the Act, 21 U.S.C. section 360bbb-3(b)(1), unless the authorization is terminated or revoked sooner. Performed at [facility], [address] [place], [place] [address]          Radiology Studies: Dg Abd 2 Views  Result Date: 07/10/2019  CLINICAL DATA:  Small bowel obstruction - likely due to adhesions per MD notes in chart. Pt c/o abdominal pain, nausea, and vomiting as well. H/o diverticulosis of colon. EXAM: ABDOMEN - 2 VIEW COMPARISON:  Abdominal radiograph 07/09/2019 FINDINGS: Persistent multiple dilated loops of small bowel concerning for obstruction. Oral contrast is seen to the level the rectum. No evidence of free air. The enteric tube side port projects likely at the gastric antrum. IMPRESSION: 1. Persistent multiple dilated loops of small bowel concerning for obstruction. Oral contrast is present in the distal colon and rectum. 2. Enteric tube side port projects at the gastric antrum. Electronically Signed   By: Audie Pinto M.D.   On: 07/10/2019 14:57   Dg Abd Portable 1v  Result Date: 07/11/2019 CLINICAL DATA:  Small bowel obstruction. EXAM: PORTABLE ABDOMEN - 1  VIEW COMPARISON:  July 10, 2019. FINDINGS: Nasogastric tube tip is seen in distal stomach. Stable dilated small bowel loops are noted concerning for distal small bowel obstruction. Residual contrast is noted in the nondilated colon. IMPRESSION: Distal tip of nasogastric tube tip seen in distal stomach. Stable dilated small bowel loops concerning for distal small bowel obstruction. Electronically Signed   By: Marijo Conception M.D.   On: 07/11/2019 13:28        Scheduled Meds: . bisacodyl  10 mg Rectal Once  . Chlorhexidine Gluconate Cloth  6 each Topical Daily  . diphenhydrAMINE  12.5 mg Intravenous QPM  . mouth rinse  15 mL Mouth Rinse BID  . pantoprazole (PROTONIX) IV  40 mg Intravenous QHS   Continuous Infusions: . sodium chloride 10 mL/hr at 07/11/19 1000  . heparin 850 Units/hr (07/11/19 1311)  . TPN ADULT (ION) 65 mL/hr at 07/11/19 1000  . TPN ADULT (ION)       LOS: 6 days       Hosie Poisson, MD [facility] 07/11/2019, 2:24 PM

## 2019-07-11 NOTE — Progress Notes (Signed)
ANTICOAGULATION CONSULT NOTE - Follow Up Consult  Pharmacy Consult for Heparin Indication: atrial fibrillation--PTA apixaban  Allergies  Allergen Reactions  . Anastrozole Other (See Comments)    arthralgias  . Aspirin Other (See Comments)    Gi upset   . Ciprofloxacin Itching  . Letrozole Other (See Comments)    leg pain    Patient Measurements: Height: 5\' 2"  (157.5 cm) Weight: 168 lb 3.4 oz (76.3 kg) IBW/kg (Calculated) : 50.1 Heparin Dosing Weight:    Vital Signs: Temp: 97.8 F (36.6 C) (11/09 0452) Temp Source: Oral (11/09 0452) BP: 127/80 (11/09 0452) Pulse Rate: 108 (11/09 0452)  Labs: Recent Labs    07/09/19 0344 07/10/19 0342 07/10/19 1200 07/11/19 0401  HGB 13.4 14.2  --  13.5  HCT 41.7 43.6  --  42.1  PLT 175 161  --  159  APTT 72* 58* 71*  --   HEPARINUNFRC 0.80* 0.45 0.35 0.61  CREATININE 1.15* 0.87  --  0.89    Estimated Creatinine Clearance: 45 mL/min (by C-G formula based on SCr of 0.89 mg/dL).   Assessment: Patient with chronic apixaban for hx of afib.  Last dose apixaban noted 11/2 with unknown time.  PTT ordered with Heparin level until both correlate due to possible drug-lab interaction between oral anticoagulant (rivaroxaban, edoxaban, or apixaban) and anti-Xa level (aka heparin level)  07/11/2019, Today -Heparin level therapeutic at 0.61 with rate of 900 units/hr, but the level did jump from lower end of therapeutic range to the higher end overnight, despite the rate not changing.  -CBC & Scr wnl -No bleeding reported, per nursing   Goal of Therapy:  Heparin level 0.3-0.7 units/ml Monitor platelets by anticoagulation protocol: Yes   Plan:  Decrease Heparin drip to 850 units/hr, will defer Heparin level recheck until tomorrow morning.  Daily heparin level & CBC while on heparin Monitor for s/sx of bleeding  Neville Route, PharmD Student 07/11/2019 9:14 AM

## 2019-07-11 NOTE — Progress Notes (Signed)
Called for patient with c/o SOB.  She is admitted with SBO but has known h/o severe systolic CHF with EF 0000000.  O2 sat is 96% with mild crackles.  Will give a 1-time dose of Lasix 40 mg IV.  If not improving, the nurse has been asked to call me back and I will come and evaluate the patient at the bedside.     Carlyon Shadow, M.D.

## 2019-07-11 NOTE — Evaluation (Signed)
Occupational Therapy Evaluation Patient Details Name: [name] [name] MRN: [id number] DOB: [birth year] Today's Date: 07/11/2019    History of Present Illness 85 yo female admitted with SBO. Hx of CHF, CKD, A fib, breast Ca.   Clinical Impression   Pt admitted with SBO. Pt currently with functional limitations due to the deficits listed below (see OT Problem List).  Pt will benefit from skilled OT to increase their safety and independence with ADL and functional mobility for ADL to facilitate discharge to venue listed below.      Follow Up Recommendations  Home health OT;Supervision/Assistance - 24 hour    Equipment Recommendations    3 n 1      Precautions / Restrictions Precautions Precautions: Fall Precaution Comments: NG tube Restrictions Weight Bearing Restrictions: No      Mobility Bed Mobility Overal bed mobility: Needs Assistance Bed Mobility: Supine to Sit     Supine to sit: Min guard;HOB elevated     General bed mobility comments: for safety, lines  Transfers Overall transfer level: Needs assistance Equipment used: None Transfers: Sit to/from Stand Sit to Stand: Min assist         General transfer comment: Assist to rise, steady, control descent. Increased time.    Balance Overall balance assessment: Needs assistance           Standing balance-Leahy Scale: Fair                             ADL either performed or assessed with clinical judgement   ADL Overall ADL's : Needs assistance/impaired Eating/Feeding: Set up;Sitting   Grooming: Set up;Sitting   Upper Body Bathing: Set up;Sitting   Lower Body Bathing: Sit to/from stand;Cueing for compensatory techniques;Cueing for safety;Moderate assistance   Upper Body Dressing : Set up;Sitting   Lower Body Dressing: Moderate assistance;Sit to/from stand;Cueing for safety;Cueing for compensatory techniques   Toilet Transfer: Moderate assistance;RW;Stand-pivot;Cueing for safety;Cueing  for sequencing Toilet Transfer Details (indicate cue type and reason): bed to chair only           General ADL Comments: pt agreeable to OOB in rocking chair     Vision Baseline Vision/History: No visual deficits       Perception     Praxis      Pertinent Vitals/Pain Pain Assessment: No/denies pain     Hand Dominance Right   Extremity/Trunk Assessment Upper Extremity Assessment Upper Extremity Assessment: Generalized weakness   Lower Extremity Assessment Lower Extremity Assessment: Generalized weakness   Cervical / Trunk Assessment Cervical / Trunk Assessment: Normal   Communication Communication Communication: No difficulties   Cognition Arousal/Alertness: Awake/alert Behavior During Therapy: WFL for tasks assessed/performed Overall Cognitive Status: Within Functional Limits for tasks assessed                                                Home Living Family/patient expects to be discharged to:: Private residence   Available Help at Discharge: Family;Available PRN/intermittently Type of Home: House Home Access: Level entry     Home Layout: One level     Bathroom Shower/Tub: Teacher, early years/pre: Standard     Home Equipment: Cane - single point;Bedside commode          Prior Functioning/Environment Level of Independence: Independent with assistive device(s)  Comments: walks with cane at all times        OT Problem List: Decreased strength;Decreased activity tolerance;Impaired balance (sitting and/or standing)      OT Treatment/Interventions: Self-care/ADL training;DME and/or AE instruction;Patient/family education    OT Goals(Current goals can be found in the care plan section) Acute Rehab OT Goals Patient Stated Goal: to get better OT Goal Formulation: With patient Time For Goal Achievement: 07/18/19  OT Frequency: Min 2X/week   Barriers to D/C: Decreased caregiver support              AM-PAC OT "6 Clicks" Daily Activity     Outcome Measure Help from another person eating meals?: Total Help from another person taking care of personal grooming?: A Little Help from another person toileting, which includes using toliet, bedpan, or urinal?: A Lot Help from another person bathing (including washing, rinsing, drying)?: A Lot Help from another person to put on and taking off regular upper body clothing?: A Little Help from another person to put on and taking off regular lower body clothing?: A Lot 6 Click Score: 13   End of Session Equipment Utilized During Treatment: Rolling walker Nurse Communication: Mobility status  Activity Tolerance: Patient tolerated treatment well Patient left: in chair;with call bell/phone within reach  OT Visit Diagnosis: Unsteadiness on feet (R26.81);Muscle weakness (generalized) (M62.81)                Time: [id number] OT Time Calculation (min): 18 min Charges:  OT General Charges $OT Visit: 1 Visit OT Evaluation $OT Eval Moderate Complexity: 1 Mod  Kari Baars, [place] [phone] Office- [phone], Edwena Felty D 07/11/2019, 7:18 PM

## 2019-07-11 NOTE — Consult Note (Signed)
   [facility] CM Inpatient Consult   07/11/2019  [name] [name] February 05, 1933 [id number]   Patient chart reviewed for readmission risk score, 30%, for unplanned readmissions and 3 hospitalizations within past 6 months. [name] is currently active with a [facility] CM community RN for chronic disease management services. [facility] CM community RN aware of patient hospital admission. Will continue to follow for progression and disposition.  Of note, [facility] Care Management services does not replace or interfere with any services that are arranged by inpatient case management or social work.  Netta Cedars, MSN, [facility] Liaison Nurse Mobile Phone [phone]  Toll free office [phone]

## 2019-07-11 NOTE — Care Management Important Message (Signed)
Important Message  Patient Details IM Letter given to Cookie McGibboney RN to present to the Patient Name: GLORIAJEAN RUEHLE MRN: [id number] Date of Birth: 08-23-1933   Medicare Important Message Given:  Yes     Kerin Salen 07/11/2019, 12:55 PM

## 2019-07-11 NOTE — Progress Notes (Signed)
[place] NOTE   Pharmacy Consult for TPN Indication: SBO  Patient Measurements: Height: 5\' 2"  (157.5 cm) Weight: 168 lb 3.4 oz (76.3 kg) IBW/kg (Calculated) : 50.1 TPN AdjBW (KG): 56.7 Body mass index is 30.77 kg/m. Usual Weight:   Central access: PICC placed 11/6 for TPN TPN start date: 07/08/2019  ASSESSMENT                                                                                                HPI: CKD, CHF, NICM EF 20-25%, Afib, HTN, HLD, Hx H pylori gastritis, diverticulosis, presents with n/v and found to have SBO, likely d/t adhesions. Patient currently expressing she does not want surgery.  Significant events: 11/5 NGT placed by IR via fluoro but pt insisted it be removed 11/6 pt agrees to have IR place NGT via fluoro, PICC/TPN ordered   Today:   Glucose (CBG goal 100-150): remains at goal on full-rate TPN; SSI discontinued  Electrolytes: K, Phos improved to WNL after KPhos and KCl IV supplementation yesterday; others stable WNL; home lasix & Kdur have been held  Renal: AKI on admit has resolved; BUN remains slightly elevated but stable; bicarb stable WNL; UOP not charted  I/O: EF 20-25%, currently not volume overloaded, home lasix held; NG OP stable from yesterday; several small BMs reported 11/7 and 11/8  LFTs: albumin slightly low, otherwise stable WNL (11/9)  TGs: stable WNL (11/9)  Prealbumin: decreased to slightly low (11/9)  MIVF: none  Current nutrition: NPO   NUTRITIONAL GOALS                                                                                             RD recs: 07/08/2019 Kcal:  1550-1750 Protein:  75-90 grams Fluid:  >/= 1.5 L  Goal rate TPN (65 ml/hr) provides 86 g protein, 1660 kcal daily; GIR 2.3  [Goal K> = 4, Mag >=2 with Afib]  PLAN                                                                                                                  At 1800 today:  continue  TPN at goal rate of 65 ml/hr  Electrolytes in TPN: no changes from yesterday;  Cl:Ac = 1:1  TPN to contain standard multivitamins and trace elements.  Per D/w MD 11/7, no IVF  No SSI, continue to check SBGs via Bmet  TPN lab panels on Mondays & Thursdays  Bmet, Phos tomorrow  Reuel Boom, PharmD, BCPS [phone] 07/11/2019, 8:59 AM

## 2019-07-12 ENCOUNTER — Inpatient Hospital Stay (HOSPITAL_COMMUNITY): Payer: Medicare Other

## 2019-07-12 LAB — CBC
HCT: 39.9 % (ref 36.0–46.0)
Hemoglobin: 12.9 g/dL (ref 12.0–15.0)
MCH: 29.3 pg (ref 26.0–34.0)
MCHC: 32.3 g/dL (ref 30.0–36.0)
MCV: 90.5 fL (ref 80.0–100.0)
Platelets: 156 10*3/uL (ref 150–400)
RBC: 4.41 MIL/uL (ref 3.87–5.11)
RDW: 16.9 % — ABNORMAL HIGH (ref 11.5–15.5)
WBC: 10.3 10*3/uL (ref 4.0–10.5)
nRBC: 0 % (ref 0.0–0.2)

## 2019-07-12 LAB — GLUCOSE, CAPILLARY
Glucose-Capillary: 121 mg/dL — ABNORMAL HIGH (ref 70–99)
Glucose-Capillary: 113 mg/dL — ABNORMAL HIGH (ref 70–99)
Glucose-Capillary: 123 mg/dL — ABNORMAL HIGH (ref 70–99)
Glucose-Capillary: 114 mg/dL — ABNORMAL HIGH (ref 70–99)

## 2019-07-12 LAB — BASIC METABOLIC PANEL
Anion gap: 9 (ref 5–15)
BUN: 38 mg/dL — ABNORMAL HIGH (ref 8–23)
CO2: 28 mmol/L (ref 22–32)
Calcium: 9.3 mg/dL (ref 8.9–10.3)
Chloride: 103 mmol/L (ref 98–111)
Creatinine, Ser: 0.97 mg/dL (ref 0.44–1.00)
GFR calc Af Amer: >60 mL/min (ref >60)
GFR calc non Af Amer: 54 mL/min — ABNORMAL LOW (ref >60)
Glucose, Bld: 121 mg/dL — ABNORMAL HIGH (ref 70–99)
Potassium: 4.5 mmol/L (ref 3.5–5.1)
Sodium: 140 mmol/L (ref 135–145)

## 2019-07-12 LAB — HEPARIN LEVEL (UNFRACTIONATED)
Heparin Unfractionated: 0.32 IU/mL (ref 0.30–0.70)
Heparin Unfractionated: 0.33 IU/mL (ref 0.30–0.70)

## 2019-07-12 LAB — PHOSPHORUS: Phosphorus: 3.5 mg/dL (ref 2.5–4.6)

## 2019-07-12 MED ORDER — POLYETHYLENE GLYCOL 3350 17 G PO PACK
17.0000 g | PACK | Freq: Every day | ORAL | Status: DC
  Administered 2019-07-12: 17 g via ORAL
  Filled 2019-07-12: qty 1

## 2019-07-12 MED ORDER — DIPHENHYDRAMINE HCL 50 MG/ML IJ SOLN
12.5000 mg | Freq: Once | INTRAMUSCULAR | Status: AC
  Administered 2019-07-12: 12.5 mg via INTRAVENOUS

## 2019-07-12 MED ORDER — BISACODYL 10 MG RE SUPP
10.0000 mg | Freq: Once | RECTAL | Status: AC
  Administered 2019-07-12: 10 mg via RECTAL
  Filled 2019-07-12: qty 1

## 2019-07-12 MED ORDER — TRAVASOL 10 % IV SOLN
INTRAVENOUS | Status: AC
  Administered 2019-07-12: 18:00:00 via INTRAVENOUS
  Filled 2019-07-12: qty 858

## 2019-07-12 MED ORDER — BOOST / RESOURCE BREEZE PO LIQD CUSTOM
1.0000 | Freq: Three times a day (TID) | ORAL | Status: DC
  Administered 2019-07-12 – 2019-07-13 (×3): 1 via ORAL

## 2019-07-12 MED ORDER — DOCUSATE SODIUM 100 MG PO CAPS
100.0000 mg | ORAL_CAPSULE | Freq: Two times a day (BID) | ORAL | Status: DC
  Administered 2019-07-12 – 2019-07-15 (×7): 100 mg via ORAL
  Filled 2019-07-12 (×7): qty 1

## 2019-07-12 NOTE — Progress Notes (Signed)
Pt NGT clamped as ordered. Pt given Dulcolax, Miralax as ordered. SRP, RN

## 2019-07-12 NOTE — Progress Notes (Signed)
ANTICOAGULATION CONSULT NOTE - Follow Up Consult  Pharmacy Consult for Heparin Indication: atrial fibrillation--PTA apixaban  Allergies  Allergen Reactions  . Anastrozole Other (See Comments)    arthralgias  . Aspirin Other (See Comments)    Gi upset   . Ciprofloxacin Itching  . Letrozole Other (See Comments)    leg pain    Patient Measurements: Height: 5\' 2"  (157.5 cm) Weight: 166 lb 0.1 oz (75.3 kg) IBW/kg (Calculated) : 50.1 Heparin Dosing Weight:    Vital Signs: Temp: 98.7 F (37.1 C) (11/10 0519) Temp Source: Oral (11/10 0519) BP: 131/78 (11/10 0519) Pulse Rate: 81 (11/10 0519)  Labs: Recent Labs    07/10/19 0342 07/10/19 1200 07/11/19 0401 07/12/19 0406  HGB 14.2  --  13.5 12.9  HCT 43.6  --  42.1 39.9  PLT 161  --  159 156  APTT 58* 71*  --   --   HEPARINUNFRC 0.45 0.35 0.61 0.32  CREATININE 0.87  --  0.89 0.97    Estimated Creatinine Clearance: 41 mL/min (by C-G formula based on SCr of 0.97 mg/dL).   Assessment: Patient with chronic apixaban for hx of afib.  Last dose apixaban noted 11/2 with unknown time.  aPTT ordered with Heparin level until both correlate due to possible drug-lab interaction between oral anticoagulant (rivaroxaban, edoxaban, or apixaban) and anti-Xa level (aka heparin level)  07/12/2019, Today -Heparin level therapeutic with rate of 850 units/hr, but the level did jump from higher end of therapeutic range to the lower end after decreasing the rate from 900 units/hr.  -Plt wnl and stable, Hgb wnl but trending down.  -No bleeding reported, per nursing    Goal of Therapy:  Heparin level 0.3-0.7 units/ml Monitor platelets by anticoagulation protocol: Yes   Plan:  Continue Heparin drip rate to 850 units/hr, will recheck heparin level at 1200 today to confirm whether patient is stable at current rate.  Daily heparin level & CBC while on heparin Monitor for s/sx of bleeding  Neville Route, PharmD Student 07/12/2019 8:47  AM

## 2019-07-12 NOTE — Progress Notes (Signed)
[facility] Progress Note     Subjective: CC: sbo Patient had 2 BM yesterday. Denies abdominal pain or nausea this AM. Got ativan overnight, seems tired this AM.   Objective: Vital signs in last 24 hours: Temp:  [98.1 F (36.7 C)-98.7 F (37.1 C)] 98.7 F (37.1 C) (11/10 0519) Pulse Rate:  [81-103] 81 (11/10 0519) Resp:  [18-24] 20 (11/10 0519) BP: (128-154)/(78-97) 131/78 (11/10 0519) SpO2:  [97 %-100 %] 97 % (11/10 0519) Weight:  [75.3 kg] 75.3 kg (11/10 0514) Last BM Date: 07/10/19  Intake/Output from previous day: 11/09 0701 - 11/10 0700 In: 2290.1 [P.O.:60; I.V.:2230.1] Out: 1150 [Urine:350; Emesis/NG output:800] Intake/Output this shift: No intake/output data recorded.  PE: Gen:  Alert, NAD, pleasant Card:  Regular rate and rhythm, pedal pulses 2+ BL Pulm:  Normal effort, clear to auscultation bilaterally Abd: Soft, non-tender, non-distended, BS hypoactive, NGT present with bilious drainage Skin: warm and dry, no rashes  Psych: A&Ox3   Lab Results:  Recent Labs    07/11/19 0401 07/12/19 0406  WBC 7.8 10.3  HGB 13.5 12.9  HCT 42.1 39.9  PLT 159 156   BMET Recent Labs    07/11/19 0401 07/12/19 0406  NA 140 140  K 4.4 4.5  CL 105 103  CO2 28 28  GLUCOSE 116* 121*  BUN 37* 38*  CREATININE 0.89 0.97  CALCIUM 9.3 9.3   PT/INR No results for input(s): LABPROT, INR in the last 72 hours. CMP     Component Value Date/Time   NA 140 07/12/2019 0406   NA 143 08/30/2018 0825   NA 141 11/05/2015 1429   K 4.5 07/12/2019 0406   K 3.8 11/05/2015 1429   CL 103 07/12/2019 0406   CL 102 05/13/2012 1021   CO2 28 07/12/2019 0406   CO2 28 11/05/2015 1429   GLUCOSE 121 (H) 07/12/2019 0406   GLUCOSE 117 11/05/2015 1429   GLUCOSE 95 05/13/2012 1021   BUN 38 (H) 07/12/2019 0406   BUN 12 08/30/2018 0825   BUN 13.8 11/05/2015 1429   CREATININE 0.97 07/12/2019 0406   CREATININE 0.9 11/05/2015 1429   CALCIUM 9.3 07/12/2019 0406   CALCIUM 9.8  11/05/2015 1429   PROT 6.2 (L) 07/11/2019 0401   PROT 7.2 11/05/2015 1429   ALBUMIN 3.1 (L) 07/11/2019 0401   ALBUMIN 3.4 (L) 11/05/2015 1429   AST 22 07/11/2019 0401   AST 13 11/05/2015 1429   ALT 18 07/11/2019 0401   ALT 14 11/05/2015 1429   ALKPHOS 40 07/11/2019 0401   ALKPHOS 68 11/05/2015 1429   BILITOT 0.8 07/11/2019 0401   BILITOT <0.30 11/05/2015 1429   GFRNONAA 54 (L) 07/12/2019 0406   GFRAA >60 07/12/2019 0406   Lipase     Component Value Date/Time   LIPASE 39 07/05/2019 1822       Studies/Results: Dg Abd 2 Views  Result Date: 07/10/2019 CLINICAL DATA:  Small bowel obstruction - likely due to adhesions per MD notes in chart. Pt c/o abdominal pain, nausea, and vomiting as well. H/o diverticulosis of colon. EXAM: ABDOMEN - 2 VIEW COMPARISON:  Abdominal radiograph 07/09/2019 FINDINGS: Persistent multiple dilated loops of small bowel concerning for obstruction. Oral contrast is seen to the level the rectum. No evidence of free air. The enteric tube side port projects likely at the gastric antrum. IMPRESSION: 1. Persistent multiple dilated loops of small bowel concerning for obstruction. Oral contrast is present in the distal colon and rectum. 2. Enteric tube side port projects  at the gastric antrum. Electronically Signed   By: Audie Pinto M.D.   On: 07/10/2019 14:57   Dg Abd Portable 1v  Result Date: 07/11/2019 CLINICAL DATA:  Small bowel obstruction. EXAM: PORTABLE ABDOMEN - 1 VIEW COMPARISON:  July 10, 2019. FINDINGS: Nasogastric tube tip is seen in distal stomach. Stable dilated small bowel loops are noted concerning for distal small bowel obstruction. Residual contrast is noted in the nondilated colon. IMPRESSION: Distal tip of nasogastric tube tip seen in distal stomach. Stable dilated small bowel loops concerning for distal small bowel obstruction. Electronically Signed   By: Marijo Conception M.D.   On: 07/11/2019 13:28    Anti-infectives: Anti-infectives (From  admission, onward)   None       Assessment/Plan CKD CHF/nonischemic cardiomyopathy with EF 20 to 25%- cardiology clearance in notes Atrial fibrillation/failed cardioversion on apixaban- currently heparin gtts Mild troponin elevation Hypertension Hyperlipidemia Hx H. pylori gastritis Weight loss  Small bowel obstruction- likely due to adhesions -Abdominal film yesterday with persistent dilated small bowel - small BM yesterday x2 -encouraged ambulation,OOB - discussed with patient surgery vs no surgery and what that could mean if she fails to improve - patient is adamant that she would not want surgery for this if she does not get better - NGT clamping trial today, allow sips and chips from floor - bowel regimen  FEN:NGT clamping, sips and chips; TPN ID: None DVT: Heparingtt Follow-up: To be determined   LOS: 7 days    Brigid Re , [facility] 07/12/2019, 8:45 AM Please see Amion for pager number during day hours 7:00am-4:30pm

## 2019-07-12 NOTE — Progress Notes (Signed)
PROGRESS NOTE    DEVONDA RIDL  [id number] DOB: 1933-08-08 DOA: 07/05/2019 PCP: Cassandria Anger, MD   Brief Narrative: 85 year old lady with prior h/o NICM with recent echo showing LVEF of 20 to 25%,persistent atrial fib failed cardioversion , hypertension, diverticulosis, hyperlipidemia, systolic and diastolic heart failure, presents with nausea, vomiting and was found to have SBO. NG TUBE was attempted but couldn't be placed, it was finally placed via fluoro guided but pt removed it. Surgery on board. In view her h/o of NICM, and persistent atrial fibrillation with tachy brady in the last 24 hours, she was started on IV metoprolol and cardiology consulted for recommendations. She is also started on IV heparin, in case she requires surgery.  After further discussion with family and the patient, surgery was able to put the NG tube and is on intermittent suction. Patient had 2 bowel movements last night, without any nausea or vomiting or abdominal pain. Her NG TUBE was clamped this morning and she was able to tolerate sips of water. NG tube was discontinued this evening and to start her on clear liquid diet.   Overnight pt had some sob, a dose of IV lasix given. CXR was ordered today for further evaluation.   Assessment & Plan:   Principal Problem:   SBO (small bowel obstruction) (HCC) Active Problems:   Essential hypertension   Nausea and vomiting   Protein-calorie malnutrition, severe   Small bowel obstruction  Abdominal film on 11/9  shows persistent dilated small bowel loops concerning for small bowel obstruction.  Surgery discussed conservative versus surgical management and patient at this time refusing any kind of surgical intervention even if she does not improve. Patient had 2 bowel movements last night, without any nausea or vomiting or abdominal pain. Her NG TUBE was clamped this morning and she was able to tolerate sips of water. NG tube was discontinued this evening and  to start her on clear liquid diet.     History of nonischemic cardiomyopathy Overnight she had some sob, and was given a dose of IV lasix. Check CXR today.  Cardiology consulted recommended to start oral metoprolol and Lasix when her bowel obstruction improves.  Continue with as needed metoprolol.   Persistent atrial fibrillation  Rate controlled, use as needed metoprolol for rates greater than 130/min Appreciate cardiology recommendations.  Recent echo showing LVEF Of 20% to 25%.  Continue with IV heparin for anticoagulation    Hypertension:  Well controlled.   Nausea and vomiting : Resolved.    Mild AKI:  Resolved with hydration.   Nutrition Patient is on TPN  Hypophosphatemia:  Replaced.  DVT prophylaxis: heparin.  Code Status: full code.  Family Communication: none at bedside.  Discussed with the son and daughter over the phone and son at bedside ON 11/6 Disposition Plan: Pending improvement in the SBO.   Consultants:   Surgery.   Cardiology SIGNED OFF .   Procedures: fluoro guided NG placement.   Antimicrobials: none.   Subjective:  2 BM yesterday, no nausea, this afternoon. No vomiting. NG tube clamped .   Objective: Vitals:   07/11/19 2114 07/12/19 0514 07/12/19 0519 07/12/19 1229  BP: (!) 128/97  131/78 (!) 144/79  Pulse: 99  81 80  Resp: (!) 24  20 16   Temp: 98.1 F (36.7 C)  98.7 F (37.1 C) 98.5 F (36.9 C)  TempSrc: Oral  Oral Oral  SpO2: 98%  97% 98%  Weight:  75.3 kg  Height:        Intake/Output Summary (Last 24 hours) at 07/12/2019 1512 Last data filed at 07/12/2019 0600 Gross per 24 hour  Intake 1350.75 ml  Output 1150 ml  Net 200.75 ml   Filed Weights   07/10/19 0450 07/11/19 0452 07/12/19 0514  Weight: 76.8 kg 76.3 kg 75.3 kg    Examination:  General exam: alert and comfortable, not in distress. NG tube clamped.  Respiratory system: diminished at bases, no wheezing or rhonchi.  Cardiovascular system: S1S2,   Irregularly irregular, no JVD.  Gastrointestinal system: abd is soft, non tender  Central nervous system: Alert and answering questions appropriately Extremities: no Cyanosis or clubbing Skin: No rashes seen Psychiatry: Mood is appropriate   Data Reviewed: I have personally reviewed following labs and imaging studies  CBC: Recent Labs  Lab 07/08/19 0357 07/09/19 0344 07/10/19 0342 07/11/19 0401 07/12/19 0406  WBC 5.3 4.5 6.1 7.8 10.3  NEUTROABS  --  2.1  --  4.5  --   HGB 14.8 13.4 14.2 13.5 12.9  HCT 45.2 41.7 43.6 42.1 39.9  MCV 88.3 88.9 88.6 89.4 90.5  PLT 181 175 161 159 [id number]   Basic Metabolic Panel: Recent Labs  Lab 07/06/19 0440  07/08/19 0357 07/09/19 0344 07/10/19 0342 07/11/19 0401 07/12/19 0406  NA  --    < > 138 141 140 140 140  K  --    < > 4.4 3.8 3.6 4.4 4.5  CL  --    < > 100 102 103 105 103  CO2  --    < > 23 29 27 28 28   GLUCOSE  --    < > 116* 128* 129* 116* 121*  BUN  --    < > 43* 38* 31* 37* 38*  CREATININE  --    < > 1.66* 1.15* 0.87 0.89 0.97  CALCIUM  --    < > 9.3 9.2 9.3 9.3 9.3  MG 1.8  --  2.1 2.3 2.0 2.1  --   PHOS  --   --  3.3 1.8* 2.4* 3.5 3.5   < > = values in this interval not displayed.   GFR: Estimated Creatinine Clearance: 41 mL/min (by C-G formula based on SCr of 0.97 mg/dL). Liver Function Tests: Recent Labs  Lab 07/05/19 1822 07/06/19 0426 07/09/19 0344 07/11/19 0401  AST 30 25 23 22   ALT 22 20 20 18   ALKPHOS 47 40 36* 40  BILITOT 1.1 1.0 0.7 0.8  PROT 7.5 6.8 6.3* 6.2*  ALBUMIN 4.1 3.7 3.3* 3.1*   Recent Labs  Lab 07/05/19 1822  LIPASE 39   No results for input(s): AMMONIA in the last 168 hours. Coagulation Profile: No results for input(s): INR, PROTIME in the last 168 hours. Cardiac Enzymes: No results for input(s): CKTOTAL, CKMB, CKMBINDEX, TROPONINI in the last 168 hours. BNP (last 3 results) No results for input(s): PROBNP in the last 8760 hours. HbA1C: No results for input(s): HGBA1C in the last  72 hours. CBG: Recent Labs  Lab 07/10/19 0007 07/10/19 0446 07/10/19 0754 07/12/19 0738 07/12/19 1148  GLUCAP 137* 129* 127* 121* 113*   Lipid Profile: Recent Labs    07/11/19 0401  TRIG 54   Thyroid Function Tests: No results for input(s): TSH, T4TOTAL, FREET4, T3FREE, THYROIDAB in the last 72 hours. Anemia Panel: No results for input(s): VITAMINB12, FOLATE, FERRITIN, TIBC, IRON, RETICCTPCT in the last 72 hours. Sepsis Labs: No results for input(s): PROCALCITON, LATICACIDVEN in the last  168 hours.  Recent Results (from the past 240 hour(s))  SARS Coronavirus 2 by RT PCR (hospital order, performed in [facility] hospital lab) Nasopharyngeal Nasopharyngeal Swab     Status: None   Collection Time: 07/05/19  9:34 PM   Specimen: Nasopharyngeal Swab  Result Value Ref Range Status   SARS Coronavirus 2 NEGATIVE NEGATIVE Final    Comment: (NOTE) If result is NEGATIVE SARS-CoV-2 target nucleic acids are NOT DETECTED. The SARS-CoV-2 RNA is generally detectable in upper and lower  respiratory specimens during the acute phase of infection. The lowest  concentration of SARS-CoV-2 viral copies this assay can detect is 250  copies / mL. A negative result does not preclude SARS-CoV-2 infection  and should not be used as the sole basis for treatment or other  patient management decisions.  A negative result may occur with  improper specimen collection / handling, submission of specimen other  than nasopharyngeal swab, presence of viral mutation(s) within the  areas targeted by this assay, and inadequate number of viral copies  (<250 copies / mL). A negative result must be combined with clinical  observations, patient history, and epidemiological information. If result is POSITIVE SARS-CoV-2 target nucleic acids are DETECTED. The SARS-CoV-2 RNA is generally detectable in upper and lower  respiratory specimens dur ing the acute phase of infection.  Positive  results are indicative of  active infection with SARS-CoV-2.  Clinical  correlation with patient history and other diagnostic information is  necessary to determine patient infection status.  Positive results do  not rule out bacterial infection or co-infection with other viruses. If result is PRESUMPTIVE POSTIVE SARS-CoV-2 nucleic acids MAY BE PRESENT.   A presumptive positive result was obtained on the submitted specimen  and confirmed on repeat testing.  While 2019 novel coronavirus  (SARS-CoV-2) nucleic acids may be present in the submitted sample  additional confirmatory testing may be necessary for epidemiological  and / or clinical management purposes  to differentiate between  SARS-CoV-2 and other Sarbecovirus currently known to infect humans.  If clinically indicated additional testing with an alternate test  methodology [phone]) is advised. The SARS-CoV-2 RNA is generally  detectable in upper and lower respiratory sp ecimens during the acute  phase of infection. The expected result is Negative. Fact Sheet for Patients:  StrictlyIdeas.no Fact Sheet for Healthcare Providers: BankingDealers.co.za This test is not yet approved or cleared by the [place] FDA and has been authorized for detection and/or diagnosis of SARS-CoV-2 by FDA under an Emergency Use Authorization (EUA).  This EUA will remain in effect (meaning this test can be used) for the duration of the COVID-19 declaration under Section 564(b)(1) of the Act, 21 U.S.C. section 360bbb-3(b)(1), unless the authorization is terminated or revoked sooner. Performed at [facility], [address] [place], [place] [address]          Radiology Studies: Dg Abd Portable 1v  Result Date: 07/11/2019 CLINICAL DATA:  Small bowel obstruction. EXAM: PORTABLE ABDOMEN - 1 VIEW COMPARISON:  July 10, 2019. FINDINGS: Nasogastric tube tip is seen in distal stomach. Stable dilated small bowel  loops are noted concerning for distal small bowel obstruction. Residual contrast is noted in the nondilated colon. IMPRESSION: Distal tip of nasogastric tube tip seen in distal stomach. Stable dilated small bowel loops concerning for distal small bowel obstruction. Electronically Signed   By: Marijo Conception M.D.   On: 07/11/2019 13:28        Scheduled Meds: .  Chlorhexidine Gluconate Cloth  6 each Topical Daily  . diphenhydrAMINE  12.5 mg Intravenous QPM  . docusate sodium  100 mg Oral BID  . feeding supplement  1 Container Oral TID BM  . mouth rinse  15 mL Mouth Rinse BID  . pantoprazole (PROTONIX) IV  40 mg Intravenous QHS  . polyethylene glycol  17 g Oral Daily   Continuous Infusions: . sodium chloride 10 mL/hr at 07/11/19 1800  . heparin 850 Units/hr (07/11/19 1311)  . TPN ADULT (ION) 65 mL/hr at 07/11/19 1800  . TPN ADULT (ION)       LOS: 7 days       Hosie Poisson, MD [facility] 07/12/2019, 3:12 PM

## 2019-07-12 NOTE — Progress Notes (Signed)
Pt has continued to be restless and agitated throughout the night. Patient called out for purewick to be removed despite RN's education. Pt proceeded to get out of bed stating "I need to sit up for a bit" and attempted to pull BSC towards her then stated " I'm ready to leave". RN Ubaldo Glassing was asked for help to calm pt down and reinforced why purewick is needed due to pt being on Lasix and pt is at risk for falling. Patient agreed to have purewick in place until the morning. On call made aware and orders obtained. Pt is currently sleeping after receiving one time dose of Benadryl.

## 2019-07-12 NOTE — Progress Notes (Signed)
NGT clamp since AM. Pt denies nausea, tol sips lf clears, pt had BM when up with PT. Pt abd soft and non distended. Flatten when lying on back. Will cont to monitor. SRP, RN

## 2019-07-12 NOTE — Progress Notes (Addendum)
PHARMACY - ADULT TOTAL PARENTERAL NUTRITION CONSULT NOTE   Pharmacy Consult for TPN Indication: SBO  Patient Measurements: Height: 5\' 2"  (157.5 cm) Weight: 166 lb 0.1 oz (75.3 kg) IBW/kg (Calculated) : 50.1 TPN AdjBW (KG): 56.7 Body mass index is 30.36 kg/m. Usual Weight:   Central access: PICC placed 11/6 for TPN TPN start date: 07/08/2019  ASSESSMENT                                                                                                HPI: CKD, CHF, NICM EF 20-25%, Afib, HTN, HLD, Hx H pylori gastritis, diverticulosis, presents with n/v and found to have SBO, likely d/t adhesions. Patient currently expressing she does not want surgery.  Significant events: 11/5 NGT placed by IR via fluoro but pt insisted it be removed 11/6 pt agrees to have IR place NGT via fluoro, PICC/TPN ordered   Glucose (CBG goal 100-150): SBG remains at goal on full-rate TPN; SSI discontinued  Electrolytes: all stable WNL; IV lasix x1 last night  Renal: AKI on admit has resolved; BUN remains slightly elevated but stable; bicarb stable WNL; UOP not charted  I/O: EF 20-25%, currently not volume overloaded, NG OP increased 3x from yesterday; irregular BM, last 11/8  LFTs: albumin slightly low, otherwise stable WNL (11/9)  TGs: stable WNL (11/9)  Prealbumin: decreased to slightly low (11/9)  MIVF: none  Current nutrition: NPO   NUTRITIONAL GOALS                                                                                             RD recs: 07/08/2019 Kcal:  1550-1750 Protein:  75-90 grams Fluid:  >/= 1.5 L  Goal rate TPN (65 ml/hr) provides 86 g protein, 1660 kcal daily; GIR 2.3  [Goal K> = 4, Mag >=2 with Afib]  PLAN                                                                                                                  At 1800 today:  continue TPN at goal rate of 65 ml/hr  Electrolytes in TPN: no changes from yesterday; reduce K slightly with increase in serum K  despite Lasix; Cl:Ac = 1:1  TPN to contain standard multivitamins and trace  elements.  Per D/w MD 11/7, no MIVF  No SSI, continue to check SBGs via Bmet  TPN lab panels on Mondays & Thursdays  Reuel Boom, PharmD, BCPS [phone] 07/12/2019, 8:19 AM

## 2019-07-12 NOTE — Plan of Care (Signed)
  Problem: Education: Goal: Knowledge of General Education information will improve Description: Including pain rating scale, medication(s)/side effects and non-pharmacologic comfort measures Outcome: Progressing   Problem: Clinical Measurements: Goal: Ability to maintain clinical measurements within normal limits will improve Outcome: Progressing Goal: Will remain free from infection Outcome: Progressing   

## 2019-07-12 NOTE — Progress Notes (Signed)
Physical Therapy Treatment Patient Details Name: [name] [name] MRN: [id number] DOB: 06/10/1933 Today's Date: 07/12/2019    History of Present Illness 85 yo female admitted with SBO. Hx of CHF, CKD, A fib, breast Ca.    PT Comments    Pt fatigues with gait, heavily reliant on UEs,  HR up to 125 with amb, returned to 100 within 1 minute. Will continue to follow; may need to try RW next session   Follow Up Recommendations  Home health PT;Supervision - Intermittent     Equipment Recommendations  Other (comment)(? RW (has rollator))    Recommendations for Other Services       Precautions / Restrictions Precautions Precautions: Fall Precaution Comments: NG tube Restrictions Weight Bearing Restrictions: No    Mobility  Bed Mobility Overal bed mobility: Needs Assistance Bed Mobility: Supine to Sit;Sit to Supine     Supine to sit: Min guard;Supervision Sit to supine: Min guard;Supervision   General bed mobility comments: for safety, lines, incr time   Transfers Overall transfer level: Needs assistance Equipment used: None Transfers: Sit to/from Stand Sit to Stand: Min assist         General transfer comment: Assist to rise, steady, control descent. Increased time.  Ambulation/Gait Ambulation/Gait assistance: Min assist Gait Distance (Feet): 110 Feet Assistive device: 1 person hand held assist;Straight cane Gait Pattern/deviations: Step-through pattern;Decreased stride length     General Gait Details: very slow gait speed. heavily reliant on cand and HHA, unsteady however no overt LOB   Stairs             Wheelchair Mobility    Modified Rankin (Stroke Patients Only)       Balance                                            Cognition Arousal/Alertness: Awake/alert Behavior During Therapy: WFL for tasks assessed/performed Overall Cognitive Status: Within Functional Limits for tasks assessed                                         Exercises      General Comments        Pertinent Vitals/Pain Pain Assessment: No/denies pain(c/o some L sided abd pain after amb, subsided)    Home Living                      Prior Function            PT Goals (current goals can now be found in the care plan section) Acute Rehab PT Goals Patient Stated Goal: to get better PT Goal Formulation: With patient Time For Goal Achievement: 07/25/19 Potential to Achieve Goals: Good Progress towards PT goals: Progressing toward goals    Frequency    Min 3X/week      PT Plan Current plan remains appropriate    Co-evaluation              AM-PAC PT "6 Clicks" Mobility   Outcome Measure  Help needed turning from your back to your side while in a flat bed without using bedrails?: A Little Help needed moving from lying on your back to sitting on the side of a flat bed without using bedrails?: A Little Help needed moving to and from a  bed to a chair (including a wheelchair)?: A Little Help needed standing up from a chair using your arms (e.g., wheelchair or bedside chair)?: A Little Help needed to walk in hospital room?: A Little Help needed climbing 3-5 steps with a railing? : A Little 6 Click Score: 18    End of Session Equipment Utilized During Treatment: Gait belt Activity Tolerance: Patient tolerated treatment well Patient left: in bed;with call bell/phone within reach;with bed alarm set   PT Visit Diagnosis: Unsteadiness on feet (R26.81);Muscle weakness (generalized) (M62.81);Difficulty in walking, not elsewhere classified (R26.2)     Time: 1400-1420 PT Time Calculation (min) (ACUTE ONLY): 20 min  Charges:  $Gait Training: 8-22 mins                     Kenyon Ana, PT  Pager: [phone] Acute Rehab Dept [facility]): [phone]   07/12/2019    [facility] 07/12/2019, 2:45 PM

## 2019-07-13 ENCOUNTER — Inpatient Hospital Stay (HOSPITAL_COMMUNITY): Payer: Medicare Other

## 2019-07-13 LAB — BASIC METABOLIC PANEL
Anion gap: 13 (ref 5–15)
Anion gap: 8 (ref 5–15)
BUN: 40 mg/dL — ABNORMAL HIGH (ref 8–23)
BUN: 41 mg/dL — ABNORMAL HIGH (ref 8–23)
CO2: 19 mmol/L — ABNORMAL LOW (ref 22–32)
CO2: 26 mmol/L (ref 22–32)
Calcium: 9.5 mg/dL (ref 8.9–10.3)
Calcium: 9.3 mg/dL (ref 8.9–10.3)
Chloride: 107 mmol/L (ref 98–111)
Chloride: 105 mmol/L (ref 98–111)
Creatinine, Ser: 1.03 mg/dL — ABNORMAL HIGH (ref 0.44–1.00)
Creatinine, Ser: 1.02 mg/dL — ABNORMAL HIGH (ref 0.44–1.00)
GFR calc Af Amer: 58 mL/min — ABNORMAL LOW (ref >60)
GFR calc Af Amer: 58 mL/min — ABNORMAL LOW (ref >60)
GFR calc non Af Amer: 50 mL/min — ABNORMAL LOW (ref >60)
GFR calc non Af Amer: 50 mL/min — ABNORMAL LOW (ref >60)
Glucose, Bld: 133 mg/dL — ABNORMAL HIGH (ref 70–99)
Glucose, Bld: 118 mg/dL — ABNORMAL HIGH (ref 70–99)
Potassium: 5 mmol/L (ref 3.5–5.1)
Potassium: 4.8 mmol/L (ref 3.5–5.1)
Sodium: 139 mmol/L (ref 135–145)
Sodium: 139 mmol/L (ref 135–145)

## 2019-07-13 LAB — GLUCOSE, CAPILLARY
Glucose-Capillary: 115 mg/dL — ABNORMAL HIGH (ref 70–99)
Glucose-Capillary: 119 mg/dL — ABNORMAL HIGH (ref 70–99)
Glucose-Capillary: 123 mg/dL — ABNORMAL HIGH (ref 70–99)
Glucose-Capillary: 127 mg/dL — ABNORMAL HIGH (ref 70–99)
Glucose-Capillary: 133 mg/dL — ABNORMAL HIGH (ref 70–99)
Glucose-Capillary: 136 mg/dL — ABNORMAL HIGH (ref 70–99)

## 2019-07-13 LAB — CBC
HCT: 43 % (ref 36.0–46.0)
Hemoglobin: 13.8 g/dL (ref 12.0–15.0)
MCH: 28.8 pg (ref 26.0–34.0)
MCHC: 32.1 g/dL (ref 30.0–36.0)
MCV: 89.6 fL (ref 80.0–100.0)
Platelets: 146 10*3/uL — ABNORMAL LOW (ref 150–400)
RBC: 4.8 MIL/uL (ref 3.87–5.11)
RDW: 16.8 % — ABNORMAL HIGH (ref 11.5–15.5)
WBC: 10.3 10*3/uL (ref 4.0–10.5)
nRBC: 0 % (ref 0.0–0.2)

## 2019-07-13 LAB — HEPARIN LEVEL (UNFRACTIONATED): Heparin Unfractionated: 0.19 IU/mL — ABNORMAL LOW (ref 0.30–0.70)

## 2019-07-13 LAB — MAGNESIUM: Magnesium: 2.4 mg/dL (ref 1.7–2.4)

## 2019-07-13 MED ORDER — TRAVASOL 10 % IV SOLN
INTRAVENOUS | Status: AC
  Administered 2019-07-13: 18:00:00 via INTRAVENOUS
  Filled 2019-07-13: qty 528

## 2019-07-13 MED ORDER — POLYETHYLENE GLYCOL 3350 17 G PO PACK
17.0000 g | PACK | Freq: Two times a day (BID) | ORAL | Status: DC
  Administered 2019-07-13 – 2019-07-15 (×4): 17 g via ORAL
  Filled 2019-07-13 (×3): qty 1

## 2019-07-13 MED ORDER — ALTEPLASE 2 MG IJ SOLR
2.0000 mg | Freq: Once | INTRAMUSCULAR | Status: DC
  Filled 2019-07-13: qty 2

## 2019-07-13 MED ORDER — APIXABAN 5 MG PO TABS
5.0000 mg | ORAL_TABLET | Freq: Two times a day (BID) | ORAL | Status: DC
  Administered 2019-07-13 – 2019-07-15 (×5): 5 mg via ORAL
  Filled 2019-07-13 (×5): qty 1

## 2019-07-13 MED ORDER — BISACODYL 10 MG RE SUPP: 10.0000 mg | Freq: Every day | RECTAL | Status: DC | PRN

## 2019-07-13 NOTE — Progress Notes (Signed)
Pt up to the bathroom had large loose soft stool. Pt less distended. ABD soft. SRP RN

## 2019-07-13 NOTE — Progress Notes (Signed)
PHARMACY - ADULT TOTAL PARENTERAL NUTRITION CONSULT NOTE   Pharmacy Consult for TPN Indication: SBO  Patient Measurements: Height: 5\' 2"  (157.5 cm) Weight: 164 lb 7.4 oz (74.6 kg) IBW/kg (Calculated) : 50.1 TPN AdjBW (KG): 56.7 Body mass index is 30.08 kg/m. Usual Weight:   Central access: PICC placed 11/6 for TPN TPN start date: 07/08/2019  ASSESSMENT                                                                                                HPI: CKD, CHF, NICM EF 20-25%, Afib, HTN, HLD, Hx H pylori gastritis, diverticulosis, presents with n/v and found to have SBO, likely d/t adhesions. Patient currently expressing she does not want surgery.  Significant events: 11/5 NGT placed by IR via fluoro but pt insisted it be removed 11/6 pt agrees to have IR place NGT via fluoro, PICC/TPN ordered 11/11 Improvement of SBO on imaging, some return of bowel function, advancing diet. Reported 14 beats of Vtach overnight   Glucose (CBG goal 100-150): SBG remains at goal on full-rate TPN; SSI discontinued  Electrolytes: K borderline high and rising despite lasix yesterday; others stable WNL  Renal: AKI on admit has resolved although SCr slightly elevated again today; BUN remains elevated but stable; bicarb now low; UOP not charted  I/O: EF 20-25%, currently not volume overloaded, NG removed yesterday; having BMs  LFTs: albumin slightly low, otherwise stable WNL (11/9)  TGs: stable WNL (11/9)  Prealbumin: decreased to slightly low (11/9)  MIVF: none  Current nutrition: CLD   NUTRITIONAL GOALS                                                                                             RD recs: 07/08/2019 Kcal:  1550-1750 Protein:  75-90 grams Fluid:  >/= 1.5 L  Goal rate TPN (65 ml/hr) provides 86 g protein, 1660 kcal daily; GIR 2.3  [Goal K> = 4, Mag >=2 with Afib]  PLAN                                                                                                                  Now: Given K 5.0 and rising in addition to VTach runs overnight, will hold TPN for the rest of today  until new bag resumed at 6p tonight  Have instructed RN to cut TPN to half-rate x 1 hr, after which IV team will stop TPN  At 1800 today:  Resume TPN at reduced rate of 40 ml/hr with improvement in SBO and some PO intake  Electrolytes in TPN: reduce K; Cl:Ac = max Ac  TPN to contain standard multivitamins and trace elements.  Per D/w MD 11/7, no MIVF  No SSI, continue to check SBGs via Bmet  TPN lab panels on Mondays & Thursdays  Reuel Boom, PharmD, BCPS [phone] 07/13/2019, 8:27 AM

## 2019-07-13 NOTE — Progress Notes (Signed)
PROGRESS NOTE    CARALINA [name]    Code Status: Full Code  [id number] DOB: 1933-03-14 DOA: 07/05/2019  PCP: Cassandria Anger, MD    Hospital Summary  85 year old lady with prior h/o NICM with recent echo showing LVEF of 20 to 25%,persistent atrial fib failed cardioversion , hypertension, diverticulosis, hyperlipidemia, systolic and diastolic heart failure, presents with nausea, vomiting and was found to have SBO. NG TUBE was attempted but couldn't be placed, it was finally placed via fluoro guided but pt removed it. Surgery on board. In view her h/o of NICM, and persistent atrial fibrillation with tachy brady in the last 24 hours, she was started on IV metoprolol and cardiology consulted for recommendations. She is also started on IV heparin, in case she requires surgery.  After further discussion with family and the patient, surgery was able to put the NG tube and is on intermittent suction. Patient had 2 bowel movements last night, without any nausea or vomiting or abdominal pain. Her NG TUBE was clamped this morning and she was able to tolerate sips of water. NG tube was discontinued this 11/10.  A & P   Principal Problem:   SBO (small bowel obstruction) (HCC) Active Problems:   Essential hypertension   Nausea and vomiting   Protein-calorie malnutrition, severe   SBO improving.  NG tube removed 11/10.  General surgery on board recommending conservative management at this time.  Had BM this afternoon.  Getting TPN and tolerating clear liquid diet -Advance diet to full liquid diet consider discontinuing TPN tomorrow if tolerates -Bowel regimen increased MiraLAX to twice daily and Dulcolax suppository as needed per surgery -Encourage out of bed ambulation  Severe malnutrition related to chronic illnesses -Continue recommendations per dietary  Persistent atrial fibrillation has been anticoagulated on heparin during hospitalization due to potential surgery. -Change heparin to  Eliquis -Cardiology consulted: Recommended to start on metoprolol and Lasix when her bowel obstruction improves.  Continue as needed metoprolol for HR> 130  NSVT asymptomatic. -Continue current management  Hypertension stable -Continue current management  Nonischemic cardiomyopathy stable -Plan as above cardiology  AKI resolved with hydration    DVT prophylaxis: Eliquis Diet: TPN, advance as tolerated Family Communication: No family at bedside Disposition Plan: Home health OT at discharge.  May be able to discharge in 1 to 2 days pending advancement of diet  Consultants  Cardiology, signed off GEN surge   Subjective   Patient seen and examined at bedside in no acute distress and resting comfortably. No acute events overnight. Denies any acute complaints at this time. Ambulating.   Per nursing, patient had an episode of tachycardia HR 140-150.  Also had an episode of NSVT x14 beats.  She was asymptomatic during this time  Objective   Vitals:   07/13/19 0427 07/13/19 0610 07/13/19 1302 07/13/19 1405  BP: 130/75 (!) 152/94 140/85   Pulse: 83 (!) 105 100   Resp: 16 16 18    Temp: 98.1 F (36.7 C) 97.9 F (36.6 C) 97.9 F (36.6 C)   TempSrc:   Oral   SpO2: 98% 100% 100% 98%  Weight: 74.6 kg     Height:        Intake/Output Summary (Last 24 hours) at 07/13/2019 1813 Last data filed at 07/13/2019 1630 Gross per 24 hour  Intake 1601.84 ml  Output -  Net 1601.84 ml   Filed Weights   07/11/19 0452 07/12/19 0514 07/13/19 0427  Weight: 76.3 kg 75.3 kg 74.6 kg  Examination:  Physical Exam Vitals signs and nursing note reviewed.  Constitutional:      Appearance: Normal appearance.  HENT:     Head: Normocephalic and atraumatic.     Nose: Nose normal.     Mouth/Throat:     Mouth: Mucous membranes are moist.  Eyes:     Extraocular Movements: Extraocular movements intact.  Neck:     Musculoskeletal: Normal range of motion. No neck rigidity.  Cardiovascular:      Rate and Rhythm: Normal rate and regular rhythm.  Pulmonary:     Effort: Pulmonary effort is normal.     Breath sounds: Normal breath sounds.  Abdominal:     General: Abdomen is flat. Bowel sounds are decreased.     Palpations: Abdomen is soft.  Musculoskeletal: Normal range of motion.        General: No swelling.  Neurological:     General: No focal deficit present.     Mental Status: She is alert. Mental status is at baseline.  Psychiatric:        Mood and Affect: Mood normal.        Behavior: Behavior normal.     Data Reviewed: I have personally reviewed following labs and imaging studies  CBC: Recent Labs  Lab 07/09/19 0344 07/10/19 0342 07/11/19 0401 07/12/19 0406 07/13/19 0437  WBC 4.5 6.1 7.8 10.3 10.3  NEUTROABS 2.1  --  4.5  --   --   HGB 13.4 14.2 13.5 12.9 13.8  HCT 41.7 43.6 42.1 39.9 43.0  MCV 88.9 88.6 89.4 90.5 89.6  PLT 175 161 159 156 [id number]*   Basic Metabolic Panel: Recent Labs  Lab 07/08/19 0357 07/09/19 0344 07/10/19 0342 07/11/19 0401 07/12/19 0406 07/13/19 0440 07/13/19 1153  NA 138 141 140 140 140 139 139  K 4.4 3.8 3.6 4.4 4.5 5.0 4.8  CL 100 102 103 105 103 107 105  CO2 23 29 27 28 28  19* 26  GLUCOSE 116* 128* 129* 116* 121* 133* 118*  BUN 43* 38* 31* 37* 38* 40* 41*  CREATININE 1.66* 1.15* 0.87 0.89 0.97 1.03* 1.02*  CALCIUM 9.3 9.2 9.3 9.3 9.3 9.5 9.3  MG 2.1 2.3 2.0 2.1  --  2.4  --   PHOS 3.3 1.8* 2.4* 3.5 3.5  --   --    GFR: Estimated Creatinine Clearance: 38.8 mL/min (A) (by C-G formula based on SCr of 1.02 mg/dL (H)). Liver Function Tests: Recent Labs  Lab 07/09/19 0344 07/11/19 0401  AST 23 22  ALT 20 18  ALKPHOS 36* 40  BILITOT 0.7 0.8  PROT 6.3* 6.2*  ALBUMIN 3.3* 3.1*   No results for input(s): LIPASE, AMYLASE in the last 168 hours. No results for input(s): AMMONIA in the last 168 hours. Coagulation Profile: No results for input(s): INR, PROTIME in the last 168 hours. Cardiac Enzymes: No results for  input(s): CKTOTAL, CKMB, CKMBINDEX, TROPONINI in the last 168 hours. BNP (last 3 results) No results for input(s): PROBNP in the last 8760 hours. HbA1C: No results for input(s): HGBA1C in the last 72 hours. CBG: Recent Labs  Lab 07/13/19 0014 07/13/19 0424 07/13/19 0852 07/13/19 1253 07/13/19 1557  GLUCAP 133* 119* 123* 115* 136*   Lipid Profile: Recent Labs    07/11/19 0401  TRIG 54   Thyroid Function Tests: No results for input(s): TSH, T4TOTAL, FREET4, T3FREE, THYROIDAB in the last 72 hours. Anemia Panel: No results for input(s): VITAMINB12, FOLATE, FERRITIN, TIBC, IRON, RETICCTPCT in the last 72  hours. Sepsis Labs: No results for input(s): PROCALCITON, LATICACIDVEN in the last 168 hours.  Recent Results (from the past 240 hour(s))  SARS Coronavirus 2 by RT PCR (hospital order, performed in [facility] hospital lab) Nasopharyngeal Nasopharyngeal Swab     Status: None   Collection Time: 07/05/19  9:34 PM   Specimen: Nasopharyngeal Swab  Result Value Ref Range Status   SARS Coronavirus 2 NEGATIVE NEGATIVE Final    Comment: (NOTE) If result is NEGATIVE SARS-CoV-2 target nucleic acids are NOT DETECTED. The SARS-CoV-2 RNA is generally detectable in upper and lower  respiratory specimens during the acute phase of infection. The lowest  concentration of SARS-CoV-2 viral copies this assay can detect is 250  copies / mL. A negative result does not preclude SARS-CoV-2 infection  and should not be used as the sole basis for treatment or other  patient management decisions.  A negative result may occur with  improper specimen collection / handling, submission of specimen other  than nasopharyngeal swab, presence of viral mutation(s) within the  areas targeted by this assay, and inadequate number of viral copies  (<250 copies / mL). A negative result must be combined with clinical  observations, patient history, and epidemiological information. If result is POSITIVE SARS-CoV-2  target nucleic acids are DETECTED. The SARS-CoV-2 RNA is generally detectable in upper and lower  respiratory specimens dur ing the acute phase of infection.  Positive  results are indicative of active infection with SARS-CoV-2.  Clinical  correlation with patient history and other diagnostic information is  necessary to determine patient infection status.  Positive results do  not rule out bacterial infection or co-infection with other viruses. If result is PRESUMPTIVE POSTIVE SARS-CoV-2 nucleic acids MAY BE PRESENT.   A presumptive positive result was obtained on the submitted specimen  and confirmed on repeat testing.  While 2019 novel coronavirus  (SARS-CoV-2) nucleic acids may be present in the submitted sample  additional confirmatory testing may be necessary for epidemiological  and / or clinical management purposes  to differentiate between  SARS-CoV-2 and other Sarbecovirus currently known to infect humans.  If clinically indicated additional testing with an alternate test  methodology [phone]) is advised. The SARS-CoV-2 RNA is generally  detectable in upper and lower respiratory sp ecimens during the acute  phase of infection. The expected result is Negative. Fact Sheet for Patients:  StrictlyIdeas.no Fact Sheet for Healthcare Providers: BankingDealers.co.za This test is not yet approved or cleared by the [place] FDA and has been authorized for detection and/or diagnosis of SARS-CoV-2 by FDA under an Emergency Use Authorization (EUA).  This EUA will remain in effect (meaning this test can be used) for the duration of the COVID-19 declaration under Section 564(b)(1) of the Act, 21 U.S.C. section 360bbb-3(b)(1), unless the authorization is terminated or revoked sooner. Performed at [facility], [address] [place], [place] [address]          Radiology Studies: Dg Chest [place] 1 View  Result Date:  07/12/2019 CLINICAL DATA:  SOB 1 day ago - RN states given Lasix last night - slight SOB remains - hypertension - hx afib - hx breast cancer - CHF EXAM: PORTABLE CHEST 1 VIEW COMPARISON:  Radiograph 07/08/2019 FINDINGS: Feeding tube and PICC line unchanged. Stable enlarged cardiac silhouette. Lungs are clear. No effusion, infiltrate pneumothorax. Bilateral axillary nodal dissection. Linear edge projecting over the LEFT lung apex is favored skin fold or sheet fold. IMPRESSION: 1. Stable support apparatus. 2.  Stable cardiomegaly.  No pulmonary edema. Electronically Signed   By: Suzy Bouchard M.D.   On: 07/12/2019 15:53   Dg Abd Portable 1v  Result Date: 07/13/2019 CLINICAL DATA:  Small bowel obstruction. EXAM: PORTABLE ABDOMEN - 1 VIEW COMPARISON:  07/11/2019 FINDINGS: Interval removal of nasogastric tube. Bowel gas pattern demonstrates persistent air-filled dilated small bowel loops with slight interval improvement. Air and contrast is seen throughout the colon. Remainder the exam is unchanged. IMPRESSION: Slight interval improvement in patients persistent small bowel obstruction. Interval removal nasogastric tube. Electronically Signed   By: Marin Olp M.D.   On: 07/13/2019 07:19        Scheduled Meds: . alteplase  2 mg Intracatheter Once  . apixaban  5 mg Oral BID  . Chlorhexidine Gluconate Cloth  6 each Topical Daily  . diphenhydrAMINE  12.5 mg Intravenous QPM  . docusate sodium  100 mg Oral BID  . feeding supplement  1 Container Oral TID BM  . mouth rinse  15 mL Mouth Rinse BID  . pantoprazole (PROTONIX) IV  40 mg Intravenous QHS  . polyethylene glycol  17 g Oral BID   Continuous Infusions: . sodium chloride 10 mL/hr at 07/11/19 1800  . TPN ADULT (ION) 40 mL/hr at 07/13/19 1812     LOS: 8 days    Time spent: 25 minutes with over 50% of the time coordinating the patient's care    Harold Hedge, DO [facility] Pager [phone]  If 7PM-7AM, please contact  night-coverage [email or web address] Password [facility] 07/13/2019, 6:13 PM

## 2019-07-13 NOTE — Progress Notes (Signed)
CCMD called that patient had 14 beats of vtach on telemetry monitor. RN and NT in room with patient, patient asymptomatic. Vital signs stable immediately after. On call provider made aware, new orders received and carried out. Will continue to monitor. Carnella [place] I

## 2019-07-13 NOTE — Progress Notes (Signed)
ANTICOAGULATION CONSULT NOTE - Follow Up Consult  Pharmacy Consult for Heparin>>Eliquis Indication: atrial fibrillation--PTA apixaban  Allergies  Allergen Reactions  . Anastrozole Other (See Comments)    arthralgias  . Aspirin Other (See Comments)    Gi upset   . Ciprofloxacin Itching  . Letrozole Other (See Comments)    leg pain    Patient Measurements: Height: 5\' 2"  (157.5 cm) Weight: 164 lb 7.4 oz (74.6 kg) IBW/kg (Calculated) : 50.1 Heparin Dosing Weight:    Vital Signs: Temp: 97.9 F (36.6 C) (11/11 0610) Temp Source: Oral (11/10 2130) BP: 152/94 (11/11 0610) Pulse Rate: 105 (11/11 0610)  Labs: Recent Labs    07/10/19 1200  07/11/19 0401 07/12/19 0406 07/12/19 1546 07/13/19 0437 07/13/19 0440  HGB  --    < > 13.5 12.9  --  13.8  --   HCT  --   --  42.1 39.9  --  43.0  --   PLT  --   --  159 156  --  146*  --   APTT 71*  --   --   --   --   --   --   HEPARINUNFRC 0.35  --  0.61 0.32 0.33 0.19*  --   CREATININE  --   --  0.89 0.97  --   --  1.03*   < > = values in this interval not displayed.    Estimated Creatinine Clearance: 38.4 mL/min (A) (by C-G formula based on SCr of 1.03 mg/dL (H)).   Assessment: Patient with chronic apixaban for hx of afib.  Last dose apixaban noted 11/2 with unknown time. aPTT ordered with Heparin level until both correlate due to possible drug-lab interaction between oral anticoagulant (rivaroxaban, edoxaban, or apixaban) and anti-Xa level (aka heparin level)  07/13/2019, Today -Heparin level subtherapeutic with rate of 850 units/hr. Heparin rate increased to 950 units/hr.  -Hgb wnl, Plt soft low with slight down trend -Per Nursing, no bleeding or infusion-related concerns. -Per Surgery and TRH, okay to transition back to Eliquis.   Plan:  D/c Heparin drip at 1000 and initiate Eliquis 5mg  PO BID at the same time. Monitor for s/sx of bleeding  Neville Route, PharmD Student 07/13/2019 9:17 AM

## 2019-07-13 NOTE — Progress Notes (Addendum)
Occupational Therapy Treatment Patient Details Name: [name] [name] MRN: [id number] DOB: 10/15/1932 Today's Date: 07/13/2019    History of present illness 85 yo female admitted with SBO. Hx of CHF, CKD, A fib, breast Ca.   OT comments  Pt doing well this day and agreeable to OT.  Pt overall S- min A with ADL activity.  Pts sister will be caring for pt at home as well as son.   Pts HR did increase to 140-150 with OT - RN aware    Follow Up Recommendations  Home health OT;Supervision/Assistance - 24 hour    Equipment Recommendations  None recommended by OT    Recommendations for Other Services      Precautions / Restrictions Precautions Precautions: Fall       Mobility Bed Mobility Overal bed mobility: Needs Assistance Bed Mobility: Supine to Sit;Sit to Supine     Supine to sit: Min guard;Supervision Sit to supine: Min guard;Supervision   General bed mobility comments: for safety, lines, incr time   Transfers Overall transfer level: Needs assistance Equipment used: None Transfers: Sit to/from [facility] Sit to Stand: Min assist         General transfer comment: Assist to rise, steady, control descent. Increased time.    Balance Overall balance assessment: Needs assistance           Standing balance-Leahy Scale: Fair                             ADL either performed or assessed with clinical judgement   ADL Overall ADL's : Needs assistance/impaired     Grooming: Standing;Set up       Lower Body Bathing: Sit to/from stand;Cueing for safety;Minimal assistance   Upper Body Dressing : Set up;Sitting   Lower Body Dressing: Minimal assistance;Sit to/from stand;Cueing for compensatory techniques;Cueing for safety;Cueing for sequencing   Toilet Transfer: Minimal assistance;BSC;RW;Stand-pivot;Cueing for safety   Toileting- Clothing Manipulation and Hygiene: Minimal assistance;Sit to/from stand;Cueing for compensatory  techniques;Cueing for safety;Cueing for sequencing       Functional mobility during ADLs: Rolling walker;Min guard       Vision Patient Visual Report: No change from baseline            Cognition Arousal/Alertness: Awake/alert Behavior During Therapy: WFL for tasks assessed/performed Overall Cognitive Status: Within Functional Limits for tasks assessed                                                     Pertinent Vitals/ Pain       Pain Assessment: No/denies pain     Prior Functioning/Environment              Frequency  Min 2X/week        Progress Toward Goals  OT Goals(current goals can now be found in the care plan section)  Progress towards OT goals: Progressing toward goals     Plan Discharge plan remains appropriate       AM-PAC OT "6 Clicks" Daily Activity     Outcome Measure   Help from another person eating meals?: A Little Help from another person taking care of personal grooming?: A Little Help from another person toileting, which includes using toliet, bedpan, or urinal?: A Little Help from another person bathing (including washing,  rinsing, drying)?: A Little Help from another person to put on and taking off regular upper body clothing?: A Little Help from another person to put on and taking off regular lower body clothing?: A Little 6 Click Score: 18    End of Session Equipment Utilized During Treatment: Rolling walker  OT Visit Diagnosis: Unsteadiness on feet (R26.81);Muscle weakness (generalized) (M62.81)   Activity Tolerance Patient tolerated treatment well   Patient Left in chair;with call bell/phone within reach   Nurse Communication Mobility status        Time: 0240-0257 OT Time Calculation (min): 17 min  Charges: OT General Charges $OT Visit: 1 Visit OT Treatments $Self Care/Home Management : 8-22 mins  Kari Baars, [place] [phone] Office- [phone], Edwena Felty D 07/13/2019, 4:36 PM

## 2019-07-13 NOTE — Progress Notes (Signed)
[facility] Progress Note     Subjective: CC: sbo Patient had some loose stools yesterday, still not passing much flatus but feels better. Denies abdominal pain or nausea since NGT removal.   Objective: Vital signs in last 24 hours: Temp:  [97.7 F (36.5 C)-98.5 F (36.9 C)] 97.9 F (36.6 C) (11/11 0610) Pulse Rate:  [80-105] 105 (11/11 0610) Resp:  [15-16] 16 (11/11 0610) BP: (120-152)/(74-94) 152/94 (11/11 0610) SpO2:  [98 %-100 %] 100 % (11/11 0610) Weight:  [74.6 kg] 74.6 kg (11/11 0427) Last BM Date: 07/12/19  Intake/Output from previous day: 11/10 0701 - 11/11 0700 In: 1936.7 [P.O.:100; I.V.:1836.7] Out: -  Intake/Output this shift: No intake/output data recorded.  PE: Gen: Alert, NAD, pleasant Card: Regular rate and rhythm Pulm: Normal effort, clear to auscultation bilaterally Abd: Soft, non-tender,non-distended,BS hypoactive Skin: warm and dry, no rashes  Psych: A&Ox3   Lab Results:  Recent Labs    07/12/19 0406 07/13/19 0437  WBC 10.3 10.3  HGB 12.9 13.8  HCT 39.9 43.0  PLT 156 146*   BMET Recent Labs    07/11/19 0401 07/12/19 0406  NA 140 140  K 4.4 4.5  CL 105 103  CO2 28 28  GLUCOSE 116* 121*  BUN 37* 38*  CREATININE 0.89 0.97  CALCIUM 9.3 9.3   PT/INR No results for input(s): LABPROT, INR in the last 72 hours. CMP     Component Value Date/Time   NA 140 07/12/2019 0406   NA 143 08/30/2018 0825   NA 141 11/05/2015 1429   K 4.5 07/12/2019 0406   K 3.8 11/05/2015 1429   CL 103 07/12/2019 0406   CL 102 05/13/2012 1021   CO2 28 07/12/2019 0406   CO2 28 11/05/2015 1429   GLUCOSE 121 (H) 07/12/2019 0406   GLUCOSE 117 11/05/2015 1429   GLUCOSE 95 05/13/2012 1021   BUN 38 (H) 07/12/2019 0406   BUN 12 08/30/2018 0825   BUN 13.8 11/05/2015 1429   CREATININE 0.97 07/12/2019 0406   CREATININE 0.9 11/05/2015 1429   CALCIUM 9.3 07/12/2019 0406   CALCIUM 9.8 11/05/2015 1429   PROT 6.2 (L) 07/11/2019 0401   PROT 7.2  11/05/2015 1429   ALBUMIN 3.1 (L) 07/11/2019 0401   ALBUMIN 3.4 (L) 11/05/2015 1429   AST 22 07/11/2019 0401   AST 13 11/05/2015 1429   ALT 18 07/11/2019 0401   ALT 14 11/05/2015 1429   ALKPHOS 40 07/11/2019 0401   ALKPHOS 68 11/05/2015 1429   BILITOT 0.8 07/11/2019 0401   BILITOT <0.30 11/05/2015 1429   GFRNONAA 54 (L) 07/12/2019 0406   GFRAA >60 07/12/2019 0406   Lipase     Component Value Date/Time   LIPASE 39 07/05/2019 1822       Studies/Results: Dg Chest Port 1 View  Result Date: 07/12/2019 CLINICAL DATA:  SOB 1 day ago - RN states given Lasix last night - slight SOB remains - hypertension - hx afib - hx breast cancer - CHF EXAM: PORTABLE CHEST 1 VIEW COMPARISON:  Radiograph 07/08/2019 FINDINGS: Feeding tube and PICC line unchanged. Stable enlarged cardiac silhouette. Lungs are clear. No effusion, infiltrate pneumothorax. Bilateral axillary nodal dissection. Linear edge projecting over the LEFT lung apex is favored skin fold or sheet fold. IMPRESSION: 1. Stable support apparatus. 2. Stable cardiomegaly.  No pulmonary edema. Electronically Signed   By: Suzy Bouchard M.D.   On: 07/12/2019 15:53   Dg Abd Portable 1v  Result Date: 07/13/2019 CLINICAL DATA:  Small  bowel obstruction. EXAM: PORTABLE ABDOMEN - 1 VIEW COMPARISON:  07/11/2019 FINDINGS: Interval removal of nasogastric tube. Bowel gas pattern demonstrates persistent air-filled dilated small bowel loops with slight interval improvement. Air and contrast is seen throughout the colon. Remainder the exam is unchanged. IMPRESSION: Slight interval improvement in patients persistent small bowel obstruction. Interval removal nasogastric tube. Electronically Signed   By: Marin Olp M.D.   On: 07/13/2019 07:19   Dg Abd Portable 1v  Result Date: 07/11/2019 CLINICAL DATA:  Small bowel obstruction. EXAM: PORTABLE ABDOMEN - 1 VIEW COMPARISON:  July 10, 2019. FINDINGS: Nasogastric tube tip is seen in distal stomach. Stable  dilated small bowel loops are noted concerning for distal small bowel obstruction. Residual contrast is noted in the nondilated colon. IMPRESSION: Distal tip of nasogastric tube tip seen in distal stomach. Stable dilated small bowel loops concerning for distal small bowel obstruction. Electronically Signed   By: Marijo Conception M.D.   On: 07/11/2019 13:28    Anti-infectives: Anti-infectives (From admission, onward)   None       Assessment/Plan CKD CHF/nonischemic cardiomyopathy with EF 20 to 25%- cardiology clearance in notes Atrial fibrillation/failed cardioversion on apixaban- currently heparin gtts Mild troponin elevation Hypertension Hyperlipidemia Hx H. pylori gastritis Weight loss  Small bowel obstruction- likely due to adhesions -Abdominal film this AM slightly improved from 2 days ago, patient had more loose BM -encouraged ambulation,OOB - discussed with patient surgery vs no surgery and what that could mean if she fails to improve - patient is adamant that she would not want surgery for this if she does not get better - started on CLD yesterday and tolerated, advance to FLD today  - bowel regimen - increased miralax to BID, dulcolax suppository prn   FEN:FLD, TPN - 1/2 or d/c TPN tomorrow if patient tolerates FLD ID: None DVT: Heparingtt   LOS: 8 days    Brigid Re , [facility] 07/13/2019, 8:07 AM Please see Amion for pager number during day hours 7:00am-4:30pm

## 2019-07-13 NOTE — Progress Notes (Signed)
Pt c/o lower left abd pain. Encouraged pt OOB and ambulate and rock in chair in room. Will cont to monitor. SRP, RN

## 2019-07-13 NOTE — Plan of Care (Signed)
  Problem: Clinical Measurements: Goal: Ability to maintain clinical measurements within normal limits will improve Outcome: Progressing Goal: Will remain free from infection Outcome: Progressing   

## 2019-07-13 NOTE — Progress Notes (Signed)
Nutrition Follow-up  RD working remotely.  DOCUMENTATION CODES:   Severe malnutrition in context of chronic illness  INTERVENTION:   - Continue Boost Breeze po TID, each supplement provides 250 kcal and 9 grams of protein  - Once diet advanced further, Ensure Enlive po BID, each supplement provides 350 kcal and 20 grams of protein  NUTRITION DIAGNOSIS:   Severe Malnutrition related to chronic illness (CHF, gastritis) as evidenced by moderate fat depletion, severe muscle depletion, percent weight loss (17.3% weight loss in less than 9 months).  Ongoing, being addressed via TPN, diet advancement, and oral nutrition supplements  GOAL:   Patient will meet greater than or equal to 90% of their needs  Progressing  MONITOR:   PO intake, Supplement acceptance, Diet advancement, Labs, Weight trends, I & O's, Other (TPN)  REASON FOR ASSESSMENT:   Consult New TPN/TNA  ASSESSMENT:   85 year old female who presented to the ED on 11/03 with abdominal pain, diarrhea, and emesis. PMH of HTN, HLD, atrial fibrillation, CHF, depression, gastritis, diverticulosis. CT showing small bowel obstruction. NGT placement attempted x 3 but unsuccessful. Pt now refusing.  11/05 - NG tube placed under fluoro, later removed 11/06 - NG tube placed, TPN initiated 11/07 - TPN increased to goal 11/10 - NG tube removed, clear liquids  TPN infusing at reduced rate of 40 ml/hr. Per Surgery note, plan is to advance diet to full liquids today.  Weight down a total of 7 lbs since admit. Will continue to monitor trends.  Pt accepting Boost Breeze supplements per MAR.  Unable to reach pt via phone call to room.  Will continue with Boost Breeze while pt on clear liquids. Once diet advanced further, will transition to Ensure Enlive.  Medications reviewed and include: Colace, Boost Breeze TID, Protonix, Miralax  Labs reviewed: BUN 40, creatinine 1.03 CBG's: 114-133 x 24 hours  Diet Order:   Diet Order             Diet clear liquid Room service appropriate? Yes; Fluid consistency: Thin  Diet effective now              EDUCATION NEEDS:   No education needs have been identified at this time  Skin:  Skin Assessment: Reviewed RN Assessment  Last BM:  07/12/19  Height:   Ht Readings from Last 1 Encounters:  07/06/19 5\' 2"  (1.575 m)    Weight:   Wt Readings from Last 1 Encounters:  07/13/19 74.6 kg    Ideal Body Weight:  50 kg  BMI:  Body mass index is 30.08 kg/m.  Estimated Nutritional Needs:   Kcal:  1550-1750  Protein:  75-90 grams  Fluid:  >/= 1.5 L    Gaynell Face, MS, RD, LDN Inpatient Clinical Dietitian Pager: [phone] Weekend/After Hours: [phone]

## 2019-07-13 NOTE — Progress Notes (Signed)
Heparin discontinued as ordered, Eliquis administered per order. SRP, RN

## 2019-07-13 NOTE — Progress Notes (Signed)
Received call from [place], RN to disconnect TPN at 1000. Arrived to room and it was still infusing at initial rate. Discussed with Truddie Coco, PharmD: TPN will infuse at 47mL for the rest of the day.  Attempted to draw lab from PICC. No blood return noted after position changes, breathing exercises and flushing. Alteplase ordered for declotting.

## 2019-07-13 NOTE — Progress Notes (Signed)
Pt HR increases to the 140-150 when working with PT and moving from bed to chair or BS commode. Pt denies chest pain, however states at times she feel as if she is having slight palpitations. MD update. SRP,RN

## 2019-07-13 NOTE — Progress Notes (Signed)
CHG bath completed  

## 2019-07-14 LAB — COMPREHENSIVE METABOLIC PANEL
ALT: 40 U/L (ref 0–44)
AST: 37 U/L (ref 15–41)
Albumin: 3.2 g/dL — ABNORMAL LOW (ref 3.5–5.0)
Alkaline Phosphatase: 81 U/L (ref 38–126)
Anion gap: 9 (ref 5–15)
BUN: 40 mg/dL — ABNORMAL HIGH (ref 8–23)
CO2: 27 mmol/L (ref 22–32)
Calcium: 9.4 mg/dL (ref 8.9–10.3)
Chloride: 103 mmol/L (ref 98–111)
Creatinine, Ser: 1 mg/dL (ref 0.44–1.00)
GFR calc Af Amer: 60 mL/min — ABNORMAL LOW (ref >60)
GFR calc non Af Amer: 52 mL/min — ABNORMAL LOW (ref >60)
Glucose, Bld: 121 mg/dL — ABNORMAL HIGH (ref 70–99)
Potassium: 4.9 mmol/L (ref 3.5–5.1)
Sodium: 139 mmol/L (ref 135–145)
Total Bilirubin: 0.8 mg/dL (ref 0.3–1.2)
Total Protein: 6.5 g/dL (ref 6.5–8.1)

## 2019-07-14 LAB — GLUCOSE, CAPILLARY
Glucose-Capillary: 103 mg/dL — ABNORMAL HIGH (ref 70–99)
Glucose-Capillary: 103 mg/dL — ABNORMAL HIGH (ref 70–99)
Glucose-Capillary: 111 mg/dL — ABNORMAL HIGH (ref 70–99)
Glucose-Capillary: 122 mg/dL — ABNORMAL HIGH (ref 70–99)
Glucose-Capillary: 124 mg/dL — ABNORMAL HIGH (ref 70–99)
Glucose-Capillary: 92 mg/dL (ref 70–99)

## 2019-07-14 LAB — PHOSPHORUS: Phosphorus: 3.6 mg/dL (ref 2.5–4.6)

## 2019-07-14 LAB — MAGNESIUM: Magnesium: 2.2 mg/dL (ref 1.7–2.4)

## 2019-07-14 LAB — CBC
HCT: 42.7 % (ref 36.0–46.0)
Hemoglobin: 13.6 g/dL (ref 12.0–15.0)
MCH: 29.1 pg (ref 26.0–34.0)
MCHC: 31.9 g/dL (ref 30.0–36.0)
MCV: 91.2 fL (ref 80.0–100.0)
Platelets: 173 10*3/uL (ref 150–400)
RBC: 4.68 MIL/uL (ref 3.87–5.11)
RDW: 16.9 % — ABNORMAL HIGH (ref 11.5–15.5)
WBC: 10.6 10*3/uL — ABNORMAL HIGH (ref 4.0–10.5)
nRBC: 0 % (ref 0.0–0.2)

## 2019-07-14 MED ORDER — ENSURE ENLIVE PO LIQD
237.0000 mL | Freq: Two times a day (BID) | ORAL | Status: DC
  Administered 2019-07-14 – 2019-07-15 (×2): 237 mL via ORAL

## 2019-07-14 MED ORDER — METOPROLOL TARTRATE 25 MG PO TABS
12.5000 mg | ORAL_TABLET | Freq: Two times a day (BID) | ORAL | Status: DC
  Administered 2019-07-14 – 2019-07-15 (×3): 12.5 mg via ORAL
  Filled 2019-07-14 (×3): qty 1

## 2019-07-14 NOTE — Progress Notes (Signed)
Pt vomited green emesis all over the bed. Pt states she belched and moderate amount of green (projectile) emesis followed. States feel much better. Pt was lying flat after lunch. Encourage pt to sit up after meals. Passing gas, no BM today. SRP, RN

## 2019-07-14 NOTE — Progress Notes (Signed)
PROGRESS NOTE    [name] [name]    Code Status: Full Code  [id number] DOB: 1932/12/25 DOA: 07/05/2019  PCP: Cassandria Anger, MD    Hospital Summary  85 year old lady with prior h/o NICM with recent echo showing LVEF of 20 to 25%,persistent atrial fib failed cardioversion , hypertension, diverticulosis, hyperlipidemia, systolic and diastolic heart failure, presents with nausea, vomiting and was found to have SBO. NG TUBE was attempted but couldn't be placed, it was finally placed via fluoro guided but pt removed it. Surgery on board. In view her h/o of NICM, and persistent atrial fibrillation with tachy brady in the last 24 hours, she was started on IV metoprolol and cardiology consulted for recommendations. She is also started on IV heparin, in case she requires surgery.  After further discussion with family and the patient, surgery was able to put the NG tube and is on intermittent suction. Patient had 2 bowel movements last night, without any nausea or vomiting or abdominal pain. Her NG TUBE was clamped this morning and she was able to tolerate sips of water. NG tube was discontinued this 11/10.  She did not tolerate advancement of diet on 11/12.  A & P   Principal Problem:   SBO (small bowel obstruction) (HCC) Active Problems:   Essential hypertension   Nausea and vomiting   Protein-calorie malnutrition, severe   SBO NG tube removed 11/10.  General surgery recommending conservative management at this time.  Having BMs.  TPN completed this afternoon.  Did not tolerate advancement of diet to soft as she vomited this evening after lunch -Will change patient back to full liquid diet -Continue bowel regimen -Encourage out of bed ambulation  Severe malnutrition related to chronic illnesses -Continue recommendations per dietary  Persistent atrial fibrillation has been anticoagulated on heparin during hospitalization due to potential surgery which was switched to Eliquis on 11/11.  Cardiology consulted on admission: Recommended to start on metoprolol and Lasix when her bowel obstruction improves. -will add on Lopressor 12.5 mg twice daily p.o. for tachycardia to 140-150s when ambulating -Patient euvolemic we will hold off Lasix at this time  NSVT asymptomatic. -Continue current management  Hypertension stable -Continue current management  Nonischemic cardiomyopathy stable -Plan as above cardiology  AKI resolved with hydration    DVT prophylaxis: Eliquis Diet: Full liquid diet Family Communication: Cussed with son over the phone Disposition Plan: Home health OT at discharge.  May be able to discharge in 1 to 2 days pending advancement of diet  Consultants  Cardiology, signed off GEN surge   Subjective   Patient seen and examined lying flat resting comfortably this morning.  At the time she had no complaints and reported she did have a bowel movement yesterday.  I was notified that the patient did not tolerate her lunch and ended up having an episode of emesis later in the day.  Discharge held which was discussed with the patient and she was understanding.  Patient denies any chest pain or shortness of breath denies any other complaints.  Remaining ROS negative  Objective   Vitals:   07/13/19 1938 07/14/19 0434 07/14/19 0456 07/14/19 1302  BP: (!) 149/98 116/78  106/71  Pulse: (!) 103 75  88  Resp: 19 18  16   Temp: 97.9 F (36.6 C) 97.6 F (36.4 C)  98.5 F (36.9 C)  TempSrc: Oral   Oral  SpO2: 98% 100%  99%  Weight:   75.6 kg   Height:  Intake/Output Summary (Last 24 hours) at 07/14/2019 1610 Last data filed at 07/14/2019 0000 Gross per 24 hour  Intake 592.26 ml  Output -  Net 592.26 ml   Filed Weights   07/12/19 0514 07/13/19 0427 07/14/19 0456  Weight: 75.3 kg 74.6 kg 75.6 kg    Examination:  Physical Exam Vitals signs and nursing note reviewed.  Constitutional:      Appearance: Normal appearance.  HENT:     Head:  Normocephalic and atraumatic.     Nose: Nose normal.  Eyes:     Extraocular Movements: Extraocular movements intact.  Neck:     Musculoskeletal: Normal range of motion. No neck rigidity.  Cardiovascular:     Rate and Rhythm: Normal rate and regular rhythm.  Pulmonary:     Effort: Pulmonary effort is normal.     Breath sounds: Normal breath sounds.  Abdominal:     General: Abdomen is flat. Bowel sounds are decreased.     Palpations: Abdomen is soft.  Musculoskeletal: Normal range of motion.        General: No swelling.  Neurological:     General: No focal deficit present.     Mental Status: She is alert. Mental status is at baseline.  Psychiatric:        Mood and Affect: Mood normal.        Behavior: Behavior normal.     Data Reviewed: I have personally reviewed following labs and imaging studies  CBC: Recent Labs  Lab 07/09/19 0344 07/10/19 0342 07/11/19 0401 07/12/19 0406 07/13/19 0437 07/14/19 0357  WBC 4.5 6.1 7.8 10.3 10.3 10.6*  NEUTROABS 2.1  --  4.5  --   --   --   HGB 13.4 14.2 13.5 12.9 13.8 13.6  HCT 41.7 43.6 42.1 39.9 43.0 42.7  MCV 88.9 88.6 89.4 90.5 89.6 91.2  PLT 175 161 159 156 146* [id number]   Basic Metabolic Panel: Recent Labs  Lab 07/09/19 0344 07/10/19 0342 07/11/19 0401 07/12/19 0406 07/13/19 0440 07/13/19 1153 07/14/19 0357  NA 141 140 140 140 139 139 139  K 3.8 3.6 4.4 4.5 5.0 4.8 4.9  CL 102 103 105 103 107 105 103  CO2 29 27 28 28  19* 26 27  GLUCOSE 128* 129* 116* 121* 133* 118* 121*  BUN 38* 31* 37* 38* 40* 41* 40*  CREATININE 1.15* 0.87 0.89 0.97 1.03* 1.02* 1.00  CALCIUM 9.2 9.3 9.3 9.3 9.5 9.3 9.4  MG 2.3 2.0 2.1  --  2.4  --  2.2  PHOS 1.8* 2.4* 3.5 3.5  --   --  3.6   GFR: Estimated Creatinine Clearance: 39.9 mL/min (by C-G formula based on SCr of 1 mg/dL). Liver Function Tests: Recent Labs  Lab 07/09/19 0344 07/11/19 0401 07/14/19 0357  AST 23 22 37  ALT 20 18 40  ALKPHOS 36* 40 81  BILITOT 0.7 0.8 0.8  PROT 6.3*  6.2* 6.5  ALBUMIN 3.3* 3.1* 3.2*   No results for input(s): LIPASE, AMYLASE in the last 168 hours. No results for input(s): AMMONIA in the last 168 hours. Coagulation Profile: No results for input(s): INR, PROTIME in the last 168 hours. Cardiac Enzymes: No results for input(s): CKTOTAL, CKMB, CKMBINDEX, TROPONINI in the last 168 hours. BNP (last 3 results) No results for input(s): PROBNP in the last 8760 hours. HbA1C: No results for input(s): HGBA1C in the last 72 hours. CBG: Recent Labs  Lab 07/13/19 2021 07/14/19 0003 07/14/19 0428 07/14/19 0745 07/14/19 1125  GLUCAP  127* 124* 103* 103* 111*   Lipid Profile: No results for input(s): CHOL, HDL, LDLCALC, TRIG, CHOLHDL, LDLDIRECT in the last 72 hours. Thyroid Function Tests: No results for input(s): TSH, T4TOTAL, FREET4, T3FREE, THYROIDAB in the last 72 hours. Anemia Panel: No results for input(s): VITAMINB12, FOLATE, FERRITIN, TIBC, IRON, RETICCTPCT in the last 72 hours. Sepsis Labs: No results for input(s): PROCALCITON, LATICACIDVEN in the last 168 hours.  Recent Results (from the past 240 hour(s))  SARS Coronavirus 2 by RT PCR (hospital order, performed in [facility] hospital lab) Nasopharyngeal Nasopharyngeal Swab     Status: None   Collection Time: 07/05/19  9:34 PM   Specimen: Nasopharyngeal Swab  Result Value Ref Range Status   SARS Coronavirus 2 NEGATIVE NEGATIVE Final    Comment: (NOTE) If result is NEGATIVE SARS-CoV-2 target nucleic acids are NOT DETECTED. The SARS-CoV-2 RNA is generally detectable in upper and lower  respiratory specimens during the acute phase of infection. The lowest  concentration of SARS-CoV-2 viral copies this assay can detect is 250  copies / mL. A negative result does not preclude SARS-CoV-2 infection  and should not be used as the sole basis for treatment or other  patient management decisions.  A negative result may occur with  improper specimen collection / handling, submission of  specimen other  than nasopharyngeal swab, presence of viral mutation(s) within the  areas targeted by this assay, and inadequate number of viral copies  (<250 copies / mL). A negative result must be combined with clinical  observations, patient history, and epidemiological information. If result is POSITIVE SARS-CoV-2 target nucleic acids are DETECTED. The SARS-CoV-2 RNA is generally detectable in upper and lower  respiratory specimens dur ing the acute phase of infection.  Positive  results are indicative of active infection with SARS-CoV-2.  Clinical  correlation with patient history and other diagnostic information is  necessary to determine patient infection status.  Positive results do  not rule out bacterial infection or co-infection with other viruses. If result is PRESUMPTIVE POSTIVE SARS-CoV-2 nucleic acids MAY BE PRESENT.   A presumptive positive result was obtained on the submitted specimen  and confirmed on repeat testing.  While 2019 novel coronavirus  (SARS-CoV-2) nucleic acids may be present in the submitted sample  additional confirmatory testing may be necessary for epidemiological  and / or clinical management purposes  to differentiate between  SARS-CoV-2 and other Sarbecovirus currently known to infect humans.  If clinically indicated additional testing with an alternate test  methodology [phone]) is advised. The SARS-CoV-2 RNA is generally  detectable in upper and lower respiratory sp ecimens during the acute  phase of infection. The expected result is Negative. Fact Sheet for Patients:  StrictlyIdeas.no Fact Sheet for Healthcare Providers: BankingDealers.co.za This test is not yet approved or cleared by the [place] FDA and has been authorized for detection and/or diagnosis of SARS-CoV-2 by FDA under an Emergency Use Authorization (EUA).  This EUA will remain in effect (meaning this test can be used) for the  duration of the COVID-19 declaration under Section 564(b)(1) of the Act, 21 U.S.C. section 360bbb-3(b)(1), unless the authorization is terminated or revoked sooner. Performed at [facility], [address] [place], [place] [address]          Radiology Studies: Dg Abd Portable 1v  Result Date: 07/13/2019 CLINICAL DATA:  Small bowel obstruction. EXAM: PORTABLE ABDOMEN - 1 VIEW COMPARISON:  07/11/2019 FINDINGS: Interval removal of nasogastric tube. Bowel gas pattern demonstrates  persistent air-filled dilated small bowel loops with slight interval improvement. Air and contrast is seen throughout the colon. Remainder the exam is unchanged. IMPRESSION: Slight interval improvement in patients persistent small bowel obstruction. Interval removal nasogastric tube. Electronically Signed   By: Marin Olp M.D.   On: 07/13/2019 07:19        Scheduled Meds: . alteplase  2 mg Intracatheter Once  . apixaban  5 mg Oral BID  . Chlorhexidine Gluconate Cloth  6 each Topical Daily  . diphenhydrAMINE  12.5 mg Intravenous QPM  . docusate sodium  100 mg Oral BID  . feeding supplement (ENSURE ENLIVE)  237 mL Oral BID BM  . mouth rinse  15 mL Mouth Rinse BID  . metoprolol tartrate  12.5 mg Oral BID  . pantoprazole (PROTONIX) IV  40 mg Intravenous QHS  . polyethylene glycol  17 g Oral BID   Continuous Infusions: . sodium chloride 10 mL/hr at 07/11/19 1800  . TPN ADULT (ION) 40 mL/hr at 07/13/19 1812     LOS: 9 days    Time spent: 30 minutes with over 50% of the time coordinating the patient's care    Harold Hedge, DO [facility] Pager [phone]  If 7PM-7AM, please contact night-coverage [email or web address] Password [email or web address] 07/14/2019, 4:10 PM

## 2019-07-14 NOTE — Progress Notes (Signed)
Physical Therapy Treatment Patient Details Name: [name] [name] MRN: [id number] DOB: [birth year] Today's Date: 07/14/2019    History of Present Illness 85 yo female admitted with SBO. Hx of CHF, CKD, A fib, breast Ca.    PT Comments    Pt fatigued from walking earlier with nursing, but did walk in room and worked on standing tolerance.  Discussed safety with RW and pt did verbalize that she would be using her RW at home.  Con't to recommend HHPT.   Follow Up Recommendations  Home health PT;Supervision - Intermittent     Equipment Recommendations  None recommended by PT    Recommendations for Other Services       Precautions / Restrictions Precautions Precautions: Fall Restrictions Weight Bearing Restrictions: No    Mobility  Bed Mobility Overal bed mobility: Needs Assistance Bed Mobility: Supine to Sit;Sit to Supine     Supine to sit: Supervision Sit to supine: Supervision   General bed mobility comments: S for lines only  Transfers Overall transfer level: Needs assistance Equipment used: Rolling walker (2 wheeled) Transfers: Sit to/from Stand Sit to Stand: Min assist;Min guard         General transfer comment: min/guard from bed and MIN A with heavy use of rail from toilet  Ambulation/Gait Ambulation/Gait assistance: Min guard Gait Distance (Feet): 15 Feet Assistive device: Rolling walker (2 wheeled) Gait Pattern/deviations: Step-through pattern Gait velocity: decreased   General Gait Details: Amb to bathroom and back with RW with no LOB   Stairs             Wheelchair Mobility    Modified Rankin (Stroke Patients Only)       Balance             Standing balance-Leahy Scale: Fair Standing balance comment: Pt steadier with RW. Stood for BP x 2, but difficulty with machine and readings- nurse informed.                            Cognition Arousal/Alertness: Awake/alert Behavior During Therapy: WFL for tasks  assessed/performed Overall Cognitive Status: Within Functional Limits for tasks assessed                                        Exercises      General Comments General comments (skin integrity, edema, etc.): Pt fatigued from BP attempts and ambulating to the bathroom.  Plus she ambulated in the hallway earlier with nursing staff.      Pertinent Vitals/Pain Pain Assessment: No/denies pain    Home Living                      Prior Function            PT Goals (current goals can now be found in the care plan section) Acute Rehab PT Goals Patient Stated Goal: to get better PT Goal Formulation: With patient Time For Goal Achievement: 07/25/19 Potential to Achieve Goals: Good Progress towards PT goals: Progressing toward goals    Frequency    Min 3X/week      PT Plan Current plan remains appropriate    Co-evaluation              AM-PAC PT "6 Clicks" Mobility   Outcome Measure  Help needed turning from your back to your side while in  a flat bed without using bedrails?: A Little Help needed moving from lying on your back to sitting on the side of a flat bed without using bedrails?: A Little Help needed moving to and from a bed to a chair (including a wheelchair)?: A Little Help needed standing up from a chair using your arms (e.g., wheelchair or bedside chair)?: A Little Help needed to walk in hospital room?: A Little Help needed climbing 3-5 steps with a railing? : A Little 6 Click Score: 18    End of Session   Activity Tolerance: Patient limited by fatigue Patient left: in bed;with call bell/phone within reach;with bed alarm set Nurse Communication: Mobility status PT Visit Diagnosis: Unsteadiness on feet (R26.81);Muscle weakness (generalized) (M62.81);Difficulty in walking, not elsewhere classified (R26.2)     Time: 1130-1150 PT Time Calculation (min) (ACUTE ONLY): 20 min  Charges:  $Gait Training: 8-22 mins                      [name] [name] L. [name] [name], [place] Pager [phone] 07/14/2019    [name] [name] 07/14/2019, 12:09 PM

## 2019-07-14 NOTE — Progress Notes (Signed)
Pt up in the hall ambulating TELE alarm noted at 140-150, 154. Pt settles back to her base. MD made aware. Orders received. SRP, RN

## 2019-07-14 NOTE — Progress Notes (Signed)
PT vomited green emesis into emesis bag. Pt had recently received po meds per schedule. P states she she believes the Miralax made her sick on the stomach. Pt states she feels better now. No PO meds seen in emesis.

## 2019-07-14 NOTE — Plan of Care (Signed)
  Problem: Education: Goal: Knowledge of General Education information will improve Description Including pain rating scale, medication(s)/side effects and non-pharmacologic comfort measures Outcome: Progressing   Problem: Clinical Measurements: Goal: Diagnostic test results will improve Outcome: Progressing Goal: Respiratory complications will improve Outcome: Progressing   

## 2019-07-14 NOTE — Progress Notes (Signed)
Pharmacy - TPN  Assessment: 6 yoF on TPN d/t SBO. Bowel function has improved and Surgery feels stable for discharge. OK to d/c TPN after today's bag.    TPN currently running at 40 ml/hr  Labs WNL this AM  Plan:  Continue TPN at 40 ml/hr; today's bag should finish around 6pm; no further TPN  Reuel Boom, PharmD, BCPS [phone] 07/14/2019, 8:37 AM

## 2019-07-14 NOTE — Progress Notes (Signed)
[facility] Progress Note     Subjective: CC: sbo Patient tolerating FLD. Had another BM yesterday and passing more flatus overnight. Denies nausea or abdominal pain. Patient hopes to go home soon.   Objective: Vital signs in last 24 hours: Temp:  [97.6 F (36.4 C)-97.9 F (36.6 C)] 97.6 F (36.4 C) (11/12 0434) Pulse Rate:  [75-103] 75 (11/12 0434) Resp:  [18-19] 18 (11/12 0434) BP: (116-149)/(78-98) 116/78 (11/12 0434) SpO2:  [98 %-100 %] 100 % (11/12 0434) Weight:  [75.6 kg] 75.6 kg (11/12 0456) Last BM Date: 07/12/19  Intake/Output from previous day: 11/11 0701 - 11/12 0700 In: 648.9 [P.O.:360; I.V.:288.9] Out: -  Intake/Output this shift: No intake/output data recorded.  PE: Gen: Alert, NAD, pleasant Card: Regular rate and rhythm Pulm: Normal effort, clear to auscultation bilaterally Abd: Soft, non-tender,non-distended,+BS Skin: warm and dry, no rashes  Psych: A&Ox3  Lab Results:  Recent Labs    07/13/19 0437 07/14/19 0357  WBC 10.3 10.6*  HGB 13.8 13.6  HCT 43.0 42.7  PLT 146* 173   BMET Recent Labs    07/13/19 1153 07/14/19 0357  NA 139 139  K 4.8 4.9  CL 105 103  CO2 26 27  GLUCOSE 118* 121*  BUN 41* 40*  CREATININE 1.02* 1.00  CALCIUM 9.3 9.4   PT/INR No results for input(s): LABPROT, INR in the last 72 hours. CMP     Component Value Date/Time   NA 139 07/14/2019 0357   NA 143 08/30/2018 0825   NA 141 11/05/2015 1429   K 4.9 07/14/2019 0357   K 3.8 11/05/2015 1429   CL 103 07/14/2019 0357   CL 102 05/13/2012 1021   CO2 27 07/14/2019 0357   CO2 28 11/05/2015 1429   GLUCOSE 121 (H) 07/14/2019 0357   GLUCOSE 117 11/05/2015 1429   GLUCOSE 95 05/13/2012 1021   BUN 40 (H) 07/14/2019 0357   BUN 12 08/30/2018 0825   BUN 13.8 11/05/2015 1429   CREATININE 1.00 07/14/2019 0357   CREATININE 0.9 11/05/2015 1429   CALCIUM 9.4 07/14/2019 0357   CALCIUM 9.8 11/05/2015 1429   PROT 6.5 07/14/2019 0357   PROT 7.2 11/05/2015  1429   ALBUMIN 3.2 (L) 07/14/2019 0357   ALBUMIN 3.4 (L) 11/05/2015 1429   AST 37 07/14/2019 0357   AST 13 11/05/2015 1429   ALT 40 07/14/2019 0357   ALT 14 11/05/2015 1429   ALKPHOS 81 07/14/2019 0357   ALKPHOS 68 11/05/2015 1429   BILITOT 0.8 07/14/2019 0357   BILITOT <0.30 11/05/2015 1429   GFRNONAA 52 (L) 07/14/2019 0357   GFRAA 60 (L) 07/14/2019 0357   Lipase     Component Value Date/Time   LIPASE 39 07/05/2019 1822       Studies/Results: Dg Chest Port 1 View  Result Date: 07/12/2019 CLINICAL DATA:  SOB 1 day ago - RN states given Lasix last night - slight SOB remains - hypertension - hx afib - hx breast cancer - CHF EXAM: PORTABLE CHEST 1 VIEW COMPARISON:  Radiograph 07/08/2019 FINDINGS: Feeding tube and PICC line unchanged. Stable enlarged cardiac silhouette. Lungs are clear. No effusion, infiltrate pneumothorax. Bilateral axillary nodal dissection. Linear edge projecting over the LEFT lung apex is favored skin fold or sheet fold. IMPRESSION: 1. Stable support apparatus. 2. Stable cardiomegaly.  No pulmonary edema. Electronically Signed   By: Suzy Bouchard M.D.   On: 07/12/2019 15:53   Dg Abd Portable 1v  Result Date: 07/13/2019 CLINICAL DATA:  Small bowel  obstruction. EXAM: PORTABLE ABDOMEN - 1 VIEW COMPARISON:  07/11/2019 FINDINGS: Interval removal of nasogastric tube. Bowel gas pattern demonstrates persistent air-filled dilated small bowel loops with slight interval improvement. Air and contrast is seen throughout the colon. Remainder the exam is unchanged. IMPRESSION: Slight interval improvement in patients persistent small bowel obstruction. Interval removal nasogastric tube. Electronically Signed   By: Marin Olp M.D.   On: 07/13/2019 07:19    Anti-infectives: Anti-infectives (From admission, onward)   None       Assessment/Plan CKD CHF/nonischemic cardiomyopathy with EF 20 to 25%- cardiology clearance in notes Atrial fibrillation/failed cardioversion  on apixaban- currently heparin gtts Mild troponin elevation Hypertension Hyperlipidemia Hx H. pylori gastritis Weight loss  Small bowel obstruction- likely due to adhesions -encouraged ambulation,OOB - discussed with patient surgery vs no surgery and what that could mean if she fails to improve - patient is adamant that she would not want surgery for this if she does not get better -advance to soft diet, patient having bowel function - continue bowel regimen on discharge - miralax BID, dulcolax suppository prn   [id number] diet, d/c TPN after bag today ID: None DVT: [email or web address]  No other surgical recommendations. Stable for discharge from our perspective when tolerating soft diet and having bowel function. We will sign off, please reconsult if we can be of further assistance.   LOS: 9 days    Brigid Re , [facility] 07/14/2019, 8:07 AM Please see Amion for pager number during day hours 7:00am-4:30pm

## 2019-07-14 NOTE — Progress Notes (Signed)
Unable to obtain Orthostatic pt has limited and restricted access.  Made several attempts. MD made aware.

## 2019-07-15 LAB — BASIC METABOLIC PANEL
Anion gap: 9 (ref 5–15)
BUN: 40 mg/dL — ABNORMAL HIGH (ref 8–23)
CO2: 27 mmol/L (ref 22–32)
Calcium: 9.3 mg/dL (ref 8.9–10.3)
Chloride: 103 mmol/L (ref 98–111)
Creatinine, Ser: 1.21 mg/dL — ABNORMAL HIGH (ref 0.44–1.00)
GFR calc Af Amer: 48 mL/min — ABNORMAL LOW (ref >60)
GFR calc non Af Amer: 41 mL/min — ABNORMAL LOW (ref >60)
Glucose, Bld: 97 mg/dL (ref 70–99)
Potassium: 5 mmol/L (ref 3.5–5.1)
Sodium: 139 mmol/L (ref 135–145)

## 2019-07-15 LAB — MAGNESIUM: Magnesium: 2.1 mg/dL (ref 1.7–2.4)

## 2019-07-15 MED ORDER — BISACODYL 10 MG RE SUPP: 10.0000 mg | Freq: Every day | RECTAL | 0 refills | Status: AC | PRN

## 2019-07-15 MED ORDER — DIPHENHYDRAMINE HCL 12.5 MG/5ML PO ELIX: 12.5000 mg | ORAL_SOLUTION | Freq: Every day | ORAL | Status: DC

## 2019-07-15 MED ORDER — DOCUSATE SODIUM 100 MG PO CAPS: 100.0000 mg | ORAL_CAPSULE | Freq: Two times a day (BID) | ORAL | 0 refills | Status: AC

## 2019-07-15 MED ORDER — METOPROLOL TARTRATE 25 MG PO TABS: 12.5000 mg | ORAL_TABLET | Freq: Two times a day (BID) | ORAL | 0 refills | Status: DC

## 2019-07-15 MED ORDER — BISACODYL 10 MG RE SUPP: 10.0000 mg | Freq: Every day | RECTAL | 0 refills | Status: DC | PRN

## 2019-07-15 MED ORDER — ONDANSETRON HCL 4 MG PO TABS: 4.0000 mg | ORAL_TABLET | Freq: Three times a day (TID) | ORAL | 0 refills | Status: DC | PRN

## 2019-07-15 MED ORDER — POLYETHYLENE GLYCOL 3350 17 G PO PACK: 17.0000 g | PACK | Freq: Two times a day (BID) | ORAL | 0 refills | Status: DC

## 2019-07-15 MED ORDER — PANTOPRAZOLE SODIUM 40 MG PO TBEC: 40.0000 mg | DELAYED_RELEASE_TABLET | Freq: Every day | ORAL | Status: DC

## 2019-07-15 MED ORDER — DOCUSATE SODIUM 100 MG PO CAPS: 100.0000 mg | ORAL_CAPSULE | Freq: Two times a day (BID) | ORAL | 0 refills | Status: DC

## 2019-07-15 MED ORDER — POLYETHYLENE GLYCOL 3350 17 G PO PACK: 17.0000 g | PACK | Freq: Two times a day (BID) | ORAL | 0 refills | Status: AC

## 2019-07-15 NOTE — Progress Notes (Signed)
I concur with previous RN assessment documentation.  

## 2019-07-15 NOTE — TOC Progression Note (Signed)
Transition of Care [facility]) - Progression Note    Patient Details  Name: [name] [name] MRN: [id number] Date of Birth: 05-Feb-1933  [facility] CM/SW Contact  Purcell Mouton, RN Phone Number: 07/15/2019, 4:13 PM  Clinical Narrative:    Pt is active with [facility]. In house rep Ronalee Belts made aware of pt being discharged today.    Expected Discharge Plan: [place] Barriers to Discharge: No Barriers Identified  Expected Discharge Plan and Services Expected Discharge Plan: [place]   Discharge Planning Services: CM Consult   Living arrangements for the past 2 months: Single Family Home Expected Discharge Date: 07/15/19                         HH Arranged: PT [place] Agency: [facility] (formerly [employer]) Date [place]: 07/07/19 Time [place]: 1204 Representative spoke with at [place]: [place] ([place]) Interventions    Readmission Risk Interventions Readmission Risk Prevention Plan 04/20/2019  Transportation Screening Complete  HRI or [place] Complete  Social Work Consult for [place] Planning/Counseling [place] Screening Not Applicable  Medication Review Press photographer) Complete  Some recent data might be hidden

## 2019-07-15 NOTE — Progress Notes (Signed)
Discharge instructions given to pt and all questions were answered.  

## 2019-07-15 NOTE — Progress Notes (Signed)
Pharmacy IV to PO conversion  The patient is receiving Diphenhydramine and Pantoprazole by the intravenous route.  Based on criteria approved by the Pharmacy and [place], the medication is being converted to the equivalent oral dose form.   No active GI bleeding or impaired absorption  Not s/p esophagectomy  Documented ability to take oral medications for > 24 hr  Plan to continue treatment for at least 1 day  If you have any questions about this conversion, please contact the Pharmacy Department (ext [phone]).  Thank you.  Reuel Boom, PharmD, BCPS [phone] 07/15/2019, 11:04 AM

## 2019-07-15 NOTE — Discharge Summary (Addendum)
Physician Discharge Summary  [name] [name] [id number] DOB: 1932-12-27 DOA: 07/05/2019  PCP: [name] Anger, MD  Admit date: 07/05/2019 Discharge date: 07/15/2019   Code Status: Full Code  Admitted From: Home Discharged to:  [facility]:PT/OT  Equipment/Devices:no  Discharge Condition:Stable   Recommendations for Outpatient Follow-up   Follow up with PCP in 1 week Please follow up BMP/CBC  Continued on Bowel Regimen  Hospital Summary  85 year old lady with prior h/o NICM with recent echo showing LVEF of 20 to 25%,persistent atrial fib failed cardioversion , hypertension, diverticulosis, hyperlipidemia, systolic and diastolic heart failure, presents with nausea, vomiting and was found to have SBO. NG TUBE was attempted but couldn't be placed, it was finally placed via fluoro guided but pt removed it. Surgery on board. In view her h/o of NICM, and persistent atrial fibrillation with tachy brady in the last 24 hours, she was started on IV metoprolol and cardiology consulted for recommendations. She is also started on IV heparin, in case she requires surgery.  After further discussion with family and the patient, surgery was able to put the NG tube and is on intermittent suction. Patient had 2 bowel movements last night, without any nausea or vomiting or abdominal pain. Her NG TUBE was clamped this morning and she was able to tolerate sips of water. NG tube was discontinued this 11/10.  A & P   Principal Problem:   SBO (small bowel obstruction) (HCC) Active Problems:   Essential hypertension   Nausea and vomiting   Protein-calorie malnutrition, severe   Partial SBO likely due to adhesions  NG tube removed 11/10.  Tolerating diet but not much appetite prior to discharge.  Patient adamant about discharge today.  Evaluated by surgery during hospitalization and was not a candidate for surgery and continued on conservative management.  Received TPN during hospitalization which  was discontinued 11/12 as patient had tolerated p.o. intake and had BMs. -Continue bowel regimen -Encouraged p.o. intake.  I discussed with the patient that if she is unable to keep food down at home that she will need to return to the ED which she was in agreement with.   Severe malnutrition related to chronic illnesses evaluated by dietary while hospitalized -Follow-up outpatient   Persistent atrial fibrillation  patiently anticoagulated on heparin during hospitalization due to potential surgery was switched to Eliquis once tolerating p.o. cardiology was consulted initially and recommended to patient on Lopressor once patient was tolerating p.o. intake.  She was started on Lopressor 25 mg twice daily 11/12. -Continue Eliquis -Continue Lopressor -Follow-up with cardiology outpatient, has an appointment on 12/17   Episode of asymptomatic NSVT  resolved -Follow-up outpatient   Hypertension stable -Continue spironolactone -Discontinue losartan in setting of renal insufficiency.  To follow-up with cardiology in 1 month has labs pending for next week   Nonischemic cardiomyopathy stable -Plan as above    AKI  initially improved with IVF today Cr slightly up 1.21 baseline 0.9 -Follow-up labs next week while off losartan  Patient requested that I not call her family today   Consultants  Cardiology Surgery  Procedures  NG tube TPN via PICC line  Antibiotics  None   Subjective  Seen and examined at bedside no acute distress resting comfortably.  She states that she was able to tolerate her breakfast this morning and had some of her lunch today but did not have much of an appetite.  She denied any nausea or vomiting at that time.  She  understands that if she is unable to keep food down at home she will need to return to the hospital or call her primary care physician.  She denies any chest pain, nausea, vomiting, diarrhea, constipation, chest pain, shortness of breath.  Remaining ROS  negative.   Objective   Discharge Exam: Vitals:   07/15/19 0543 07/15/19 1328  BP: (!) 142/89 140/84  Pulse: 99 71  Resp: 16 19  Temp: 98 F (36.7 C) 97.9 F (36.6 C)  SpO2: 98% 99%   Vitals:   07/14/19 2105 07/15/19 0451 07/15/19 0543 07/15/19 1328  BP: 139/87  (!) 142/89 140/84  Pulse: (!) 56  99 71  Resp: 14  16 19   Temp:   98 F (36.7 C) 97.9 F (36.6 C)  TempSrc:   Oral Oral  SpO2: 98%  98% 99%  Weight:  79.6 kg    Height:        Physical Exam Vitals signs and nursing note reviewed.  Constitutional:      Appearance: Normal appearance.  HENT:     Head: Normocephalic and atraumatic.     Nose: Nose normal.     Mouth/Throat:     Mouth: Mucous membranes are moist.  Eyes:     Extraocular Movements: Extraocular movements intact.  Neck:     Musculoskeletal: Normal range of motion. No neck rigidity.  Cardiovascular:     Rate and Rhythm: Normal rate and regular rhythm.  Pulmonary:     Effort: Pulmonary effort is normal.     Breath sounds: Normal breath sounds.  Abdominal:     General: Abdomen is flat. Bowel sounds are normal.     Palpations: Abdomen is soft.  Musculoskeletal: Normal range of motion.        General: No swelling.  Neurological:     General: No focal deficit present.     Mental Status: She is alert. Mental status is at baseline.  Psychiatric:        Mood and Affect: Mood normal.        Behavior: Behavior normal.       The results of significant diagnostics from this hospitalization (including imaging, microbiology, ancillary and laboratory) are listed below for reference.     Microbiology: Recent Results (from the past 240 hour(s))  SARS Coronavirus 2 by RT PCR (hospital order, performed in [facility] hospital lab) Nasopharyngeal Nasopharyngeal Swab     Status: None   Collection Time: 07/05/19  9:34 PM   Specimen: Nasopharyngeal Swab  Result Value Ref Range Status   SARS Coronavirus 2 NEGATIVE NEGATIVE Final    Comment: (NOTE) If  result is NEGATIVE SARS-CoV-2 target nucleic acids are NOT DETECTED. The SARS-CoV-2 RNA is generally detectable in upper and lower  respiratory specimens during the acute phase of infection. The lowest  concentration of SARS-CoV-2 viral copies this assay can detect is 250  copies / mL. A negative result does not preclude SARS-CoV-2 infection  and should not be used as the sole basis for treatment or other  patient management decisions.  A negative result may occur with  improper specimen collection / handling, submission of specimen other  than nasopharyngeal swab, presence of viral mutation(s) within the  areas targeted by this assay, and inadequate number of viral copies  (<250 copies / mL). A negative result must be combined with clinical  observations, patient history, and epidemiological information. If result is POSITIVE SARS-CoV-2 target nucleic acids are DETECTED. The SARS-CoV-2 RNA is generally detectable in upper  and lower  respiratory specimens dur ing the acute phase of infection.  Positive  results are indicative of active infection with SARS-CoV-2.  Clinical  correlation with patient history and other diagnostic information is  necessary to determine patient infection status.  Positive results do  not rule out bacterial infection or co-infection with other viruses. If result is PRESUMPTIVE POSTIVE SARS-CoV-2 nucleic acids MAY BE PRESENT.   A presumptive positive result was obtained on the submitted specimen  and confirmed on repeat testing.  While 2019 novel coronavirus  (SARS-CoV-2) nucleic acids may be present in the submitted sample  additional confirmatory testing may be necessary for epidemiological  and / or clinical management purposes  to differentiate between  SARS-CoV-2 and other Sarbecovirus currently known to infect humans.  If clinically indicated additional testing with an alternate test  methodology [phone]) is advised. The SARS-CoV-2 RNA is generally   detectable in upper and lower respiratory sp ecimens during the acute  phase of infection. The expected result is Negative. Fact Sheet for Patients:  StrictlyIdeas.no Fact Sheet for Healthcare Providers: BankingDealers.co.za This test is not yet approved or cleared by the [place] FDA and has been authorized for detection and/or diagnosis of SARS-CoV-2 by FDA under an Emergency Use Authorization (EUA).  This EUA will remain in effect (meaning this test can be used) for the duration of the COVID-19 declaration under Section 564(b)(1) of the Act, 21 U.S.C. section 360bbb-3(b)(1), unless the authorization is terminated or revoked sooner. Performed at [facility], [address] [place], [place] [address]      Labs: BNP (last 3 results) Recent Labs    04/09/19 2201 04/18/19 0605  BNP 1,385.5* [id number]*   Basic Metabolic Panel: Recent Labs  Lab 07/09/19 0344 07/10/19 0342 07/11/19 0401 07/12/19 0406 07/13/19 0440 07/13/19 1153 07/14/19 0357 07/15/19 0411  NA 141 140 140 140 139 139 139 139  K 3.8 3.6 4.4 4.5 5.0 4.8 4.9 5.0  CL 102 103 105 103 107 105 103 103  CO2 29 27 28 28  19* 26 27 27   GLUCOSE 128* 129* 116* 121* 133* 118* 121* 97  BUN 38* 31* 37* 38* 40* 41* 40* 40*  CREATININE 1.15* 0.87 0.89 0.97 1.03* 1.02* 1.00 1.21*  CALCIUM 9.2 9.3 9.3 9.3 9.5 9.3 9.4 9.3  MG 2.3 2.0 2.1  --  2.4  --  2.2 2.1  PHOS 1.8* 2.4* 3.5 3.5  --   --  3.6  --    Liver Function Tests: Recent Labs  Lab 07/09/19 0344 07/11/19 0401 07/14/19 0357  AST 23 22 37  ALT 20 18 40  ALKPHOS 36* 40 81  BILITOT 0.7 0.8 0.8  PROT 6.3* 6.2* 6.5  ALBUMIN 3.3* 3.1* 3.2*   No results for input(s): LIPASE, AMYLASE in the last 168 hours. No results for input(s): AMMONIA in the last 168 hours. CBC: Recent Labs  Lab 07/09/19 0344 07/10/19 0342 07/11/19 0401 07/12/19 0406 07/13/19 0437 07/14/19 0357  WBC 4.5 6.1 7.8 10.3  10.3 10.6*  NEUTROABS 2.1  --  4.5  --   --   --   HGB 13.4 14.2 13.5 12.9 13.8 13.6  HCT 41.7 43.6 42.1 39.9 43.0 42.7  MCV 88.9 88.6 89.4 90.5 89.6 91.2  PLT 175 161 159 156 146* 173   Cardiac Enzymes: No results for input(s): CKTOTAL, CKMB, CKMBINDEX, TROPONINI in the last 168 hours. BNP: Invalid input(s): POCBNP CBG: Recent Labs  Lab 07/14/19 0428 07/14/19 0745 07/14/19  1125 07/14/19 1643 07/14/19 2111  GLUCAP 103* 103* 111* 122* 92   D-Dimer No results for input(s): DDIMER in the last 72 hours. Hgb A1c No results for input(s): HGBA1C in the last 72 hours. Lipid Profile No results for input(s): CHOL, HDL, LDLCALC, TRIG, CHOLHDL, LDLDIRECT in the last 72 hours. Thyroid function studies No results for input(s): TSH, T4TOTAL, T3FREE, THYROIDAB in the last 72 hours.  Invalid input(s): FREET3 Anemia work up No results for input(s): VITAMINB12, FOLATE, FERRITIN, TIBC, IRON, RETICCTPCT in the last 72 hours. Urinalysis    Component Value Date/Time   COLORURINE YELLOW 07/05/2019 2048   APPEARANCEUR TURBID (A) 07/05/2019 2048   LABSPEC >1.030 (H) 07/05/2019 2048   PHURINE 5.5 07/05/2019 2048   GLUCOSEU NEGATIVE 07/05/2019 2048   GLUCOSEU NEGATIVE 07/28/2018 1620   HGBUR LARGE (A) 07/05/2019 2048   BILIRUBINUR SMALL (A) 07/05/2019 2048   BILIRUBINUR neg 09/24/2015 1055   KETONESUR NEGATIVE 07/05/2019 2048   PROTEINUR 100 (A) 07/05/2019 2048   UROBILINOGEN 1.0 07/28/2018 1620   NITRITE NEGATIVE 07/05/2019 2048   LEUKOCYTESUR NEGATIVE 07/05/2019 2048   Sepsis Labs Invalid input(s): PROCALCITONIN,  WBC,  LACTICIDVEN Microbiology Recent Results (from the past 240 hour(s))  SARS Coronavirus 2 by RT PCR (hospital order, performed in [facility] hospital lab) Nasopharyngeal Nasopharyngeal Swab     Status: None   Collection Time: 07/05/19  9:34 PM   Specimen: Nasopharyngeal Swab  Result Value Ref Range Status   SARS Coronavirus 2 NEGATIVE NEGATIVE Final    Comment:  (NOTE) If result is NEGATIVE SARS-CoV-2 target nucleic acids are NOT DETECTED. The SARS-CoV-2 RNA is generally detectable in upper and lower  respiratory specimens during the acute phase of infection. The lowest  concentration of SARS-CoV-2 viral copies this assay can detect is 250  copies / mL. A negative result does not preclude SARS-CoV-2 infection  and should not be used as the sole basis for treatment or other  patient management decisions.  A negative result may occur with  improper specimen collection / handling, submission of specimen other  than nasopharyngeal swab, presence of viral mutation(s) within the  areas targeted by this assay, and inadequate number of viral copies  (<250 copies / mL). A negative result must be combined with clinical  observations, patient history, and epidemiological information. If result is POSITIVE SARS-CoV-2 target nucleic acids are DETECTED. The SARS-CoV-2 RNA is generally detectable in upper and lower  respiratory specimens dur ing the acute phase of infection.  Positive  results are indicative of active infection with SARS-CoV-2.  Clinical  correlation with patient history and other diagnostic information is  necessary to determine patient infection status.  Positive results do  not rule out bacterial infection or co-infection with other viruses. If result is PRESUMPTIVE POSTIVE SARS-CoV-2 nucleic acids MAY BE PRESENT.   A presumptive positive result was obtained on the submitted specimen  and confirmed on repeat testing.  While 2019 novel coronavirus  (SARS-CoV-2) nucleic acids may be present in the submitted sample  additional confirmatory testing may be necessary for epidemiological  and / or clinical management purposes  to differentiate between  SARS-CoV-2 and other Sarbecovirus currently known to infect humans.  If clinically indicated additional testing with an alternate test  methodology [phone]) is advised. The SARS-CoV-2 RNA is  generally  detectable in upper and lower respiratory sp ecimens during the acute  phase of infection. The expected result is Negative. Fact Sheet for Patients:  StrictlyIdeas.no Fact Sheet for Healthcare Providers:  BankingDealers.co.za This test is not yet approved or cleared by the [place] and has been authorized for detection and/or diagnosis of SARS-CoV-2 by FDA under an Emergency Use Authorization (EUA).  This EUA will remain in effect (meaning this test can be used) for the duration of the COVID-19 declaration under Section 564(b)(1) of the Act, 21 U.S.C. section 360bbb-3(b)(1), unless the authorization is terminated or revoked sooner. Performed at [facility], [address] [place], [place] [address]     Discharge Instructions     Discharge Instructions     Diet - low sodium heart healthy   Complete by: As directed    Discharge instructions   Complete by: As directed    You were seen and examined in the hospital for a small bowel obstruction and cared for by a hospitalist as well as a surgeon  Upon Discharge:  -Stop taking losartan -Roberts -If you have any nausea or vomiting or difficulty eating make sure you contact your primary care physician or return to the ED as we discussed -Start taking metoprolol tartrate 12.5 mg by mouth 2 times daily -Take Eliquis -Take Zofran as prescribed for nausea -Not taking hydrocodone-acetaminophen -Stop taking Lomotil -Stop taking your potassium -Take Dulcolax, Colace and MiraLAX as prescribed for constipation -Follow-up with your cardiologist on 12/17.  You have an appointment scheduled already -Get lab work on Monday of next week and follow-up with your primary care physician next week  Bring all home medications to your appointment to review Request that your primary physician go over all hospital tests and procedures/radiological results  at the follow up.   Please get all hospital records sent to your physician by signing a hospital release before you go home.     Read the complete instructions along with all the possible side effects for all the medicines you take and that have been prescribed to you. Take any new medicines after you have completely understood and accept all the possible adverse reactions/side effects.   If you have any questions about your discharge medications or the care you received while you were in the hospital, you can call the unit and asked to speak with the hospitalist on call. Once you are discharged, your primary care physician will handle any further medical issues. Please note that NO REFILLS for any discharge medications will be authorized, as it is imperative that you return to your primary care physician (or establish a relationship with a primary care physician if you do not have one) for your aftercare needs so that they can reassess your need for medications and monitor your lab values.   Do not drive, operate heavy machinery, perform activities at heights, swimming or participation in water activities or provide baby sitting services if your were admitted for loss of consciousness/seizures or if you are on sedating medications including, but not limited to benzodiazepines, sleep medications, narcotic pain medications, etc., until you have been cleared to do so by a medical doctor.    Do not take more than prescribed medications.   Wear a seat belt while driving.  If you have smoked or chewed Tobacco  in the last 2 years please stop smoking; also stop any regular Alcohol and/or any Recreational drug use including marijuana.  If you experience worsening of your admission symptoms or develop shortness of breath, chest pain, suicidal or homicidal thoughts or experience a life threatening emergency, you must seek medical attention immediately by calling 911 or  calling your PCP immediately.    Increase activity slowly   Complete by: As directed       Allergies as of 07/15/2019       Reactions   Anastrozole Other (See Comments)   arthralgias   Aspirin Other (See Comments)   Gi upset    Ciprofloxacin Itching   Letrozole Other (See Comments)   leg pain        Medication List     STOP taking these medications    diphenoxylate-atropine 2.5-0.025 MG tablet Commonly known as: Lomotil   HYDROcodone-acetaminophen 5-325 MG tablet Commonly known as: NORCO/VICODIN   potassium chloride SA 20 MEQ tablet Commonly known as: Klor-Con M20       TAKE these medications    acetaminophen 500 MG tablet Commonly known as: TYLENOL Take 500 mg by mouth 2 (two) times daily as needed for moderate pain.   apixaban 5 MG Tabs tablet Commonly known as: Eliquis Take 1 tablet (5 mg total) by mouth 2 (two) times daily.   bisacodyl 10 MG suppository Commonly known as: DULCOLAX Place 1 suppository (10 mg total) rectally daily as needed for mild constipation.   [employer] B-12 500 MCG Subl Generic drug: Cyanocobalamin Place 2 tablets (1,000 mcg total) under the tongue daily.   docusate sodium 100 MG capsule Commonly known as: COLACE Take 1 capsule (100 mg total) by mouth 2 (two) times daily.   furosemide 40 MG tablet Commonly known as: LASIX Take 1 tablet (40 mg total) by mouth daily. MAY AN ADDITIONAL DOES IF WEIGHT IS GREATER THAN 3 LBS OR AS DIRECTED   metoprolol tartrate 25 MG tablet Commonly known as: LOPRESSOR Take 0.5 tablets (12.5 mg total) by mouth 2 (two) times daily.   multivitamin with minerals Tabs tablet Take 1 tablet by mouth daily.   ondansetron 4 MG tablet Commonly known as: Zofran Take 1 tablet (4 mg total) by mouth every 8 (eight) hours as needed for nausea or vomiting.   pantoprazole 40 MG tablet Commonly known as: PROTONIX Take 1 tablet (40 mg total) by mouth 2 (two) times daily.   polyethylene glycol 17 g packet Commonly known as: MIRALAX /  GLYCOLAX Take 17 g by mouth 2 (two) times daily.   pravastatin 20 MG tablet Commonly known as: Pravachol Take 1 tablet (20 mg total) by mouth daily.   spironolactone 25 MG tablet Commonly known as: ALDACTONE Take 0.5 tablets (12.5 mg total) by mouth daily.   temazepam 15 MG capsule Commonly known as: RESTORIL Take 1 capsule (15 mg total) by mouth at bedtime as needed for sleep.   triamcinolone cream 0.5 % Commonly known as: KENALOG APPLY  CREAM TOPICALLY TO AFFECTED AREA 4 TIMES DAILY ON RASH What changed: See the new instructions.         Allergies  Allergen Reactions   Anastrozole Other (See Comments)    arthralgias   Aspirin Other (See Comments)    Gi upset    Ciprofloxacin Itching   Letrozole Other (See Comments)    leg pain    Time coordinating discharge: Over 30 minutes   SIGNED:   Harold Hedge, D.O. [facility] Pager: [phone]  07/15/2019, 5:24 PM

## 2019-07-15 NOTE — Progress Notes (Signed)
S: multiple bowel movements this morning, some coughing fits, no more vomiting O: BP (!) 142/89 (BP Location: Right Leg)   Pulse 99   Temp 98 F (36.7 C) (Oral)   Resp 16   Ht 5\' 2"  (1.575 m)   Wt 79.6 kg   SpO2 98%   BMI 32.10 kg/m  Gen: NAD Neuro: AOx4 Abd: soft, NT, ND  A/P 85 yo female resolving SBO -ok for any diet -no plans for further surgical intervention

## 2019-07-17 DIAGNOSIS — G510 Bell's palsy: Secondary | ICD-10-CM | POA: Diagnosis not present

## 2019-07-17 DIAGNOSIS — N183 Chronic kidney disease, stage 3 unspecified: Secondary | ICD-10-CM | POA: Diagnosis not present

## 2019-07-17 DIAGNOSIS — E538 Deficiency of other specified B group vitamins: Secondary | ICD-10-CM | POA: Diagnosis not present

## 2019-07-17 DIAGNOSIS — I255 Ischemic cardiomyopathy: Secondary | ICD-10-CM | POA: Diagnosis not present

## 2019-07-17 DIAGNOSIS — J449 Chronic obstructive pulmonary disease, unspecified: Secondary | ICD-10-CM | POA: Diagnosis not present

## 2019-07-17 DIAGNOSIS — K219 Gastro-esophageal reflux disease without esophagitis: Secondary | ICD-10-CM | POA: Diagnosis not present

## 2019-07-17 DIAGNOSIS — Z7901 Long term (current) use of anticoagulants: Secondary | ICD-10-CM | POA: Diagnosis not present

## 2019-07-17 DIAGNOSIS — I2510 Atherosclerotic heart disease of native coronary artery without angina pectoris: Secondary | ICD-10-CM | POA: Diagnosis not present

## 2019-07-17 DIAGNOSIS — E46 Unspecified protein-calorie malnutrition: Secondary | ICD-10-CM | POA: Diagnosis not present

## 2019-07-17 DIAGNOSIS — G4700 Insomnia, unspecified: Secondary | ICD-10-CM | POA: Diagnosis not present

## 2019-07-17 DIAGNOSIS — E559 Vitamin D deficiency, unspecified: Secondary | ICD-10-CM | POA: Diagnosis not present

## 2019-07-17 DIAGNOSIS — I130 Hypertensive heart and chronic kidney disease with heart failure and stage 1 through stage 4 chronic kidney disease, or unspecified chronic kidney disease: Secondary | ICD-10-CM | POA: Diagnosis not present

## 2019-07-17 DIAGNOSIS — E785 Hyperlipidemia, unspecified: Secondary | ICD-10-CM | POA: Diagnosis not present

## 2019-07-17 DIAGNOSIS — M1710 Unilateral primary osteoarthritis, unspecified knee: Secondary | ICD-10-CM | POA: Diagnosis not present

## 2019-07-17 DIAGNOSIS — I420 Dilated cardiomyopathy: Secondary | ICD-10-CM | POA: Diagnosis not present

## 2019-07-17 DIAGNOSIS — I890 Lymphedema, not elsewhere classified: Secondary | ICD-10-CM | POA: Diagnosis not present

## 2019-07-17 DIAGNOSIS — I4821 Permanent atrial fibrillation: Secondary | ICD-10-CM | POA: Diagnosis not present

## 2019-07-17 DIAGNOSIS — K5732 Diverticulitis of large intestine without perforation or abscess without bleeding: Secondary | ICD-10-CM | POA: Diagnosis not present

## 2019-07-17 DIAGNOSIS — I5023 Acute on chronic systolic (congestive) heart failure: Secondary | ICD-10-CM | POA: Diagnosis not present

## 2019-07-17 DIAGNOSIS — I5032 Chronic diastolic (congestive) heart failure: Secondary | ICD-10-CM | POA: Diagnosis not present

## 2019-07-17 DIAGNOSIS — D631 Anemia in chronic kidney disease: Secondary | ICD-10-CM | POA: Diagnosis not present

## 2019-07-17 DIAGNOSIS — R1310 Dysphagia, unspecified: Secondary | ICD-10-CM | POA: Diagnosis not present

## 2019-07-18 ENCOUNTER — Telehealth: Payer: Self-pay | Admitting: Internal Medicine

## 2019-07-18 ENCOUNTER — Telehealth: Payer: Self-pay | Admitting: Cardiology

## 2019-07-18 NOTE — Telephone Encounter (Signed)
Patient was discharged from hospital on 07/05/19. She states that they refused to refill her prescription for apixaban (ELIQUIS) 5 MG TABS tablet and wanted to speak to a nurse in regards to this.

## 2019-07-18 NOTE — Telephone Encounter (Signed)
Copied from [place] [id number]. Topic: Quick Communication - Home Health Verbal Orders >> Jul 18, 2019 12:59 PM Virl Axe D wrote: Caller/Agency: [employer] Number: [id number] for [name]  Requesting OT/PT/Skilled Nursing/Social Work/Speech Therapy: Skilled Nursing Frequency: 1 week 4

## 2019-07-18 NOTE — Telephone Encounter (Signed)
Called patient, she states she received a letter from insurance that they would no longer pay for her medication.  Patient was advised to contact pharmacy to see if medication would need a PA. Patient will call back to let us know.

## 2019-07-19 ENCOUNTER — Other Ambulatory Visit: Payer: Self-pay | Admitting: *Deleted

## 2019-07-19 NOTE — Patient Outreach (Signed)
[facility]) Care Management  07/19/2019  TALYSHA DOLLARD [birth year] [id number]   RED ON EMMI ALERT - General Discharge Day # 1 Date: 07/28/2019 Red Alert Reason: No transportation to MD appointment   Outreach attempt # 1, successful.  Noted that member discharged from hospital after admission with SBO, primary MD office will complete transition of care assessment.  Call placed to member to follow up on discharge and red alert noted above.  Report she is still feeling a little weak, but able to tolerate small amounts of food.  She will try solid foods again today.  Her son and sister have been with her to provide assistance and support as well as she has been receiving home health PT/OT.  Last visit from them was Sunday.  She has follow up appointment with PCP tomorrow, state her son will provide transportation.    She has been following instructions of medication changes and monitoring daily weights.  Weight today was 159 pounds.  Expresses concern with being able to obtain her Eliquis, state she is spreading out her doses of the samples she received in order to have enough.  Advised against saving medication and educated on taking appropriately.  She verbalizes understanding.  Denies any urgent concerns at this time, will follow up with pharmacist regarding assistance application and will follow up with member within the next 2 weeks.  Valente David, [place], MSN [facility]  Community Care Manager [phone]    Plan: RN CM will

## 2019-07-20 ENCOUNTER — Other Ambulatory Visit (INDEPENDENT_AMBULATORY_CARE_PROVIDER_SITE_OTHER): Payer: Medicare Other

## 2019-07-20 ENCOUNTER — Other Ambulatory Visit: Payer: Self-pay

## 2019-07-20 ENCOUNTER — Encounter: Payer: Self-pay | Admitting: Internal Medicine

## 2019-07-20 ENCOUNTER — Ambulatory Visit (INDEPENDENT_AMBULATORY_CARE_PROVIDER_SITE_OTHER): Payer: Medicare Other | Admitting: Internal Medicine

## 2019-07-20 DIAGNOSIS — E538 Deficiency of other specified B group vitamins: Secondary | ICD-10-CM | POA: Diagnosis not present

## 2019-07-20 DIAGNOSIS — N183 Chronic kidney disease, stage 3 unspecified: Secondary | ICD-10-CM | POA: Diagnosis not present

## 2019-07-20 DIAGNOSIS — E559 Vitamin D deficiency, unspecified: Secondary | ICD-10-CM

## 2019-07-20 DIAGNOSIS — K56609 Unspecified intestinal obstruction, unspecified as to partial versus complete obstruction: Secondary | ICD-10-CM

## 2019-07-20 DIAGNOSIS — R634 Abnormal weight loss: Secondary | ICD-10-CM

## 2019-07-20 DIAGNOSIS — I4821 Permanent atrial fibrillation: Secondary | ICD-10-CM

## 2019-07-20 DIAGNOSIS — E440 Moderate protein-calorie malnutrition: Secondary | ICD-10-CM

## 2019-07-20 LAB — BASIC METABOLIC PANEL
BUN: 25 mg/dL — ABNORMAL HIGH (ref 6–23)
CO2: 27 mEq/L (ref 19–32)
Calcium: 9.5 mg/dL (ref 8.4–10.5)
Chloride: 96 mEq/L (ref 96–112)
Creatinine, Ser: 1.52 mg/dL — ABNORMAL HIGH (ref 0.40–1.20)
GFR: 39.38 mL/min — ABNORMAL LOW (ref >60.00)
Glucose, Bld: 101 mg/dL — ABNORMAL HIGH (ref 70–99)
Potassium: 3.5 mEq/L (ref 3.5–5.1)
Sodium: 137 mEq/L (ref 135–145)

## 2019-07-20 LAB — HEPATIC FUNCTION PANEL
ALT: 43 U/L — ABNORMAL HIGH (ref 0–35)
AST: 33 U/L (ref 0–37)
Albumin: 3.9 g/dL (ref 3.5–5.2)
Alkaline Phosphatase: 88 U/L (ref 39–117)
Bilirubin, Direct: 0.2 mg/dL (ref 0.0–0.3)
Total Bilirubin: 0.8 mg/dL (ref 0.2–1.2)
Total Protein: 7.3 g/dL (ref 6.0–8.3)

## 2019-07-20 NOTE — Assessment & Plan Note (Signed)
On B12 

## 2019-07-20 NOTE — Assessment & Plan Note (Signed)
Cont Rx 

## 2019-07-20 NOTE — Assessment & Plan Note (Signed)
Labs

## 2019-07-20 NOTE — Assessment & Plan Note (Signed)
S/p SBO Boost if tolerated Small meals

## 2019-07-20 NOTE — Assessment & Plan Note (Signed)
s/p SBO Boost if tolerated Small meals

## 2019-07-20 NOTE — Assessment & Plan Note (Signed)
11/20 SBO Boost if tolerated Small meals

## 2019-07-20 NOTE — Assessment & Plan Note (Signed)
Vit D 

## 2019-07-20 NOTE — Progress Notes (Signed)
Subjective:  Patient ID: [name] [name], female    DOB: 03-16-1933  Age: 85 y.o. MRN: [id number]  CC: No chief complaint on file.   HPI [name] [name] presents for a post-hosp f/u for SBO, dehydration - NG tube placement has helped. No surgery was needed. C/o weakness, wt loss. Some abd pain after meals   Per hx: " Admit date: 07/05/2019 Discharge date: 07/15/2019   Code Status: Full Code  Admitted From: Home Discharged to:  [facility]:PT/OT  Equipment/Devices:no  Discharge Condition:Stable   Recommendations for Outpatient Follow-up   1. Follow up with PCP in 1 week 2. Please follow up BMP/CBC  3. Continued on Bowel Regimen  Hospital Summary  85 year old lady with prior h/o NICM with recent echo showing LVEF of 20 to 25%,persistent atrial fib failed cardioversion , hypertension, diverticulosis, hyperlipidemia, systolic and diastolic heart failure, presents with nausea, vomiting and was found to have SBO. NG TUBE was attempted but couldn't be placed, it was finally placed via fluoro guided but pt removed it. Surgery on board. In view her h/o of NICM, and persistent atrial fibrillation with tachy brady in the last 24 hours, she was started on IV metoprolol and cardiology consulted for recommendations. She is also started on IV heparin, in case she requires surgery.  After further discussion with family and the patient, surgery was able to put the NG tubeand is on intermittent suction. Patient had 2 bowel movements last night, without any nausea or vomiting or abdominal pain. Her NG TUBE was clamped this morning and she was able to tolerate sips of water. NG tube was discontinued this11/10.  A & P   Principal Problem:   SBO (small bowel obstruction) (HCC) Active Problems:   Essential hypertension   Nausea and vomiting   Protein-calorie malnutrition, severe   SBO NG tube removed 11/10.  Tolerating diet but not much appetite prior to discharge.  Patient  adamant about discharge today.  Evaluated by surgery during hospitalization and was not a candidate for surgery and continued on conservative management.  Received TPN during hospitalization which was discontinued 11/12 as patient had tolerated p.o. intake. -Continue bowel regimen -Encouraged p.o. intake.  I discussed with the patient that if she is unable to keep food down at home that she will need to return to the ED which she was in agreement with.  Severe malnutrition related to chronic illnesses evaluated by dietary while hospitalized -Follow-up outpatient  Persistent atrial fibrillation patiently anticoagulated on heparin during hospitalization due to potential surgery was switched to Eliquis once tolerating p.o. cardiology was consulted initially and recommended to patient on Lopressor once patient was tolerating p.o. intake.  She was started on Lopressor 25 mg twice daily 11/12. -Continue Eliquis -Continue Lopressor -Follow-up with cardiology outpatient, has an appointment on 12/17  Episode of asymptomatic NSVT resolved -Follow-up outpatient  Hypertensionstable -Continue spironolactone -Discontinue losartan in setting of renal insufficiency.  To follow-up with cardiology in 1 month has labs pending for next week  Nonischemic cardiomyopathystable -Plan as above   AKI initially improved with IVF today Cr slightly up 1.21 baseline 0.9 -Follow-up labs next week while off losartan  Patient requested that I not call her family today   Consultants   Cardiology  Surgery  Procedures   NG tube  TPN via PICC line  Antibiotics  None   Subjective  Seen and examined at bedside no acute distress resting comfortably.  She states that she was able to tolerate  her breakfast this morning and had some of her lunch today but did not have much of an appetite.  She denied any nausea or vomiting at that time.  She understands that if she is unable to keep food down at  home she will need to return to the hospital or call her primary care physician.  She denies any chest pain, nausea, vomiting, diarrhea, constipation, chest pain, shortness of breath.  Remaining ROS negative.   Objective   Discharge Exam:     Vitals:   07/15/19 0543 07/15/19 1328  BP: (!) 142/89 140/84  Pulse: 99 71  Resp: 16 19  Temp: 98 F (36.7 C) 97.9 F (36.6 C)  SpO2: 98% 99%         Vitals:   07/14/19 2105 07/15/19 0451 07/15/19 0543 07/15/19 1328  BP: 139/87  (!) 142/89 140/84  Pulse: (!) 56  99 71  Resp: 14  16 19   Temp:   98 F (36.7 C) 97.9 F (36.6 C)  TempSrc:   Oral Oral  SpO2: 98%  98% 99%  Weight:  79.6 kg    Height:        Physical Exam Vitals signs and nursing note reviewed.  Constitutional:      Appearance: Normal appearance.  HENT:     Head: Normocephalic and atraumatic.     Nose: Nose normal.     Mouth/Throat:     Mouth: Mucous membranes are moist.  Eyes:     Extraocular Movements: Extraocular movements intact.  Neck:     Musculoskeletal: Normal range of motion. No neck rigidity.  Cardiovascular:     Rate and Rhythm: Normal rate and regular rhythm.  Pulmonary:     Effort: Pulmonary effort is normal.     Breath sounds: Normal breath sounds.  Abdominal:     General: Abdomen is flat. Bowel sounds are normal.     Palpations: Abdomen is soft.  Musculoskeletal: Normal range of motion.        General: No swelling.  Neurological:     General: No focal deficit present.     Mental Status: She is alert. Mental status is at baseline.  Psychiatric:        Mood and Affect: Mood normal.        Behavior: Behavior normal.  "     The results of significant diagnostics from this hospitalization (including imaging, microbiology, ancillary and laboratory) are listed below for reference.     Outpatient Medications Prior to Visit  Medication Sig Dispense Refill   acetaminophen (TYLENOL) 500 MG tablet Take 500 mg by  mouth 2 (two) times daily as needed for moderate pain.      apixaban (ELIQUIS) 5 MG TABS tablet Take 1 tablet (5 mg total) by mouth 2 (two) times daily. 42 tablet 0   bisacodyl (DULCOLAX) 10 MG suppository Place 1 suppository (10 mg total) rectally daily as needed for mild constipation. 12 suppository 0   [employer] B-12 500 MCG SUBL Place 2 tablets (1,000 mcg total) under the tongue daily. 200 tablet 3   docusate sodium (COLACE) 100 MG capsule Take 1 capsule (100 mg total) by mouth 2 (two) times daily. 10 capsule 0   furosemide (LASIX) 40 MG tablet Take 1 tablet (40 mg total) by mouth daily. MAY AN ADDITIONAL DOES IF WEIGHT IS GREATER THAN 3 LBS OR AS DIRECTED 110 tablet 3   metoprolol tartrate (LOPRESSOR) 25 MG tablet Take 0.5 tablets (12.5 mg total) by mouth 2 (two)  times daily. 60 tablet 0   Multiple Vitamin (MULTIVITAMIN WITH MINERALS) TABS tablet Take 1 tablet by mouth daily.     ondansetron (ZOFRAN) 4 MG tablet Take 1 tablet (4 mg total) by mouth every 8 (eight) hours as needed for nausea or vomiting. 20 tablet 0   pantoprazole (PROTONIX) 40 MG tablet Take 1 tablet (40 mg total) by mouth 2 (two) times daily. 60 tablet 5   polyethylene glycol (MIRALAX / GLYCOLAX) 17 g packet Take 17 g by mouth 2 (two) times daily. 14 each 0   spironolactone (ALDACTONE) 25 MG tablet Take 0.5 tablets (12.5 mg total) by mouth daily. 90 tablet 3   temazepam (RESTORIL) 15 MG capsule Take 1 capsule (15 mg total) by mouth at bedtime as needed for sleep. 30 capsule 3   triamcinolone cream (KENALOG) 0.5 % APPLY  CREAM TOPICALLY TO AFFECTED AREA 4 TIMES DAILY ON RASH (Patient taking differently: Apply 1 application topically 4 (four) times daily. ) 60 g 0   pravastatin (PRAVACHOL) 20 MG tablet Take 1 tablet (20 mg total) by mouth daily. 90 tablet 3   No facility-administered medications prior to visit.     ROS: Review of Systems  Constitutional: Positive for fatigue and unexpected weight change. Negative for  activity change, appetite change and chills.  HENT: Negative for congestion, mouth sores and sinus pressure.   Eyes: Negative for visual disturbance.  Respiratory: Negative for cough and chest tightness.   Gastrointestinal: Positive for abdominal pain. Negative for abdominal distention, constipation, diarrhea, nausea and vomiting.  Genitourinary: Negative for difficulty urinating, frequency and vaginal pain.  Musculoskeletal: Negative for back pain and gait problem.  Skin: Negative for pallor and rash.  Neurological: Positive for weakness. Negative for dizziness, tremors, numbness and headaches.  Psychiatric/Behavioral: Negative for confusion, decreased concentration, sleep disturbance and suicidal ideas.    Objective:  BP 118/78 (BP Location: Right Arm, Patient Position: Sitting, Cuff Size: Normal)    Pulse (!) 50    Temp 98.3 F (36.8 C) (Oral)    Ht 5\' 2"  (1.575 m)    Wt 159 lb (72.1 kg)    SpO2 98%    BMI 29.08 kg/m   BP Readings from Last 3 Encounters:  07/20/19 118/78  07/15/19 140/84  07/05/19 121/86    Wt Readings from Last 3 Encounters:  07/20/19 159 lb (72.1 kg)  07/15/19 175 lb 7.8 oz (79.6 kg)  06/29/19 172 lb (78 kg)    Physical Exam Constitutional:      General: She is not in acute distress.    Appearance: She is well-developed. She is not ill-appearing.  HENT:     Head: Normocephalic.     Right Ear: External ear normal.     Left Ear: External ear normal.     Nose: Nose normal.  Eyes:     General:        Right eye: No discharge.        Left eye: No discharge.     Conjunctiva/sclera: Conjunctivae normal.     Pupils: Pupils are equal, round, and reactive to light.  Neck:     Musculoskeletal: Normal range of motion and neck supple.     Thyroid: No thyromegaly.     Vascular: No JVD.     Trachea: No tracheal deviation.  Cardiovascular:     Rate and Rhythm: Normal rate and regular rhythm.     Heart sounds: Normal heart sounds.  Pulmonary:     Effort: No  respiratory  distress.     Breath sounds: No stridor. No wheezing.  Abdominal:     General: Bowel sounds are normal. There is no distension.     Palpations: Abdomen is soft. There is no mass.     Tenderness: There is no abdominal tenderness. There is no guarding or rebound.  Musculoskeletal:        General: No tenderness.  Lymphadenopathy:     Cervical: No cervical adenopathy.  Skin:    Findings: No erythema or rash.  Neurological:     Mental Status: She is oriented to person, place, and time.     Cranial Nerves: No cranial nerve deficit.     Motor: No abnormal muscle tone.     Coordination: Coordination normal.     Deep Tendon Reflexes: Reflexes normal.  Psychiatric:        Behavior: Behavior normal.        Thought Content: Thought content normal.        Judgment: Judgment normal.    Looks tired  Lab Results  Component Value Date   WBC 10.6 (H) 07/14/2019   HGB 13.6 07/14/2019   HCT 42.7 07/14/2019   PLT 173 07/14/2019   GLUCOSE 97 07/15/2019   CHOL 210 (H) 04/10/2019   TRIG 54 07/11/2019   HDL 62 04/10/2019   LDLCALC 138 (H) 04/10/2019   ALT 40 07/14/2019   AST 37 07/14/2019   NA 139 07/15/2019   K 5.0 07/15/2019   CL 103 07/15/2019   CREATININE 1.21 (H) 07/15/2019   BUN 40 (H) 07/15/2019   CO2 27 07/15/2019   TSH 2.691 04/09/2019   INR 1.67 (H) 05/27/2014   HGBA1C 5.6 04/10/2019    Ct Abdomen Pelvis Wo Contrast  Result Date: 07/05/2019 CLINICAL DATA:  Abdominal distension, diarrhea, vomiting EXAM: CT ABDOMEN AND PELVIS WITHOUT CONTRAST TECHNIQUE: Multidetector CT imaging of the abdomen and pelvis was performed following the standard protocol without IV contrast. COMPARISON:  04/10/2019 cardiomegaly.  No acute abnormality. FINDINGS: Lower chest: Cardiomegaly.  No acute abnormality. Hepatobiliary: No focal hepatic abnormality. Gallbladder unremarkable. Pancreas: No focal abnormality or ductal dilatation. Spleen: No focal abnormality.  Normal size. Adrenals/Urinary  Tract: No hydronephrosis. Small low-density lesion in the right adrenal gland is stable since prior study, likely adenoma. Urinary bladder decompressed, unremarkable. Stomach/Bowel: There are dilated fluid-filled small bowel loops in the abdomen and pelvis. Distal small bowel is decompressed. Findings compatible with small bowel obstruction. Exact transition not visualized. Findings compatible with moderate to high-grade small bowel obstruction. Stomach is dilated and fluid-filled. Vascular/Lymphatic: Aortic atherosclerosis. No enlarged abdominal or pelvic lymph nodes. Reproductive: Prior hysterectomy.  No adnexal masses. Other: Moderate free fluid in the pelvis and adjacent to the liver. No free air. Musculoskeletal: No acute bony abnormality. IMPRESSION: Dilated fluid-filled stomach and small bowel into the pelvis compatible with small bowel obstruction, likely moderate to high-grade. Distal small bowel is decompressed. Exact transition not visualized. Sigmoid diverticulosis.  No active diverticulitis. Moderate free fluid in the abdomen and pelvis. Aortic atherosclerosis. Electronically Signed   By: Rolm Baptise M.D.   On: 07/05/2019 21:02   Dg Abd 1 View  Result Date: 07/06/2019 CLINICAL DATA:  Abdominal pain, nausea/vomiting, SBO EXAM: ABDOMEN - 1 VIEW COMPARISON:  CT abdomen/pelvis dated 07/05/2019 FINDINGS: Multiple dilated loops of small bowel in the left mid abdomen. Degenerative changes of the lumbar spine. Visualized bony pelvis appears intact. IMPRESSION: Multiple dilated loops of small bowel in the left mid abdomen, compatible with small obstruction. Electronically Signed  By: Julian Hy M.D.   On: 07/06/2019 11:32    Assessment & Plan:   There are no diagnoses linked to this encounter.   No orders of the defined types were placed in this encounter.    Follow-up: No follow-ups on file.  Walker Kehr, MD

## 2019-07-20 NOTE — Telephone Encounter (Signed)
Verbals given, FYI 

## 2019-07-22 ENCOUNTER — Other Ambulatory Visit: Payer: Self-pay

## 2019-07-22 DIAGNOSIS — R32 Unspecified urinary incontinence: Secondary | ICD-10-CM

## 2019-07-22 DIAGNOSIS — K219 Gastro-esophageal reflux disease without esophagitis: Secondary | ICD-10-CM

## 2019-07-22 DIAGNOSIS — I2510 Atherosclerotic heart disease of native coronary artery without angina pectoris: Secondary | ICD-10-CM

## 2019-07-22 DIAGNOSIS — I5042 Chronic combined systolic (congestive) and diastolic (congestive) heart failure: Secondary | ICD-10-CM | POA: Diagnosis not present

## 2019-07-22 DIAGNOSIS — F419 Anxiety disorder, unspecified: Secondary | ICD-10-CM

## 2019-07-22 DIAGNOSIS — E785 Hyperlipidemia, unspecified: Secondary | ICD-10-CM

## 2019-07-22 DIAGNOSIS — N183 Chronic kidney disease, stage 3 unspecified: Secondary | ICD-10-CM | POA: Diagnosis not present

## 2019-07-22 DIAGNOSIS — K5790 Diverticulosis of intestine, part unspecified, without perforation or abscess without bleeding: Secondary | ICD-10-CM

## 2019-07-22 DIAGNOSIS — M1710 Unilateral primary osteoarthritis, unspecified knee: Secondary | ICD-10-CM

## 2019-07-22 DIAGNOSIS — Z87440 Personal history of urinary (tract) infections: Secondary | ICD-10-CM

## 2019-07-22 DIAGNOSIS — E538 Deficiency of other specified B group vitamins: Secondary | ICD-10-CM

## 2019-07-22 DIAGNOSIS — J449 Chronic obstructive pulmonary disease, unspecified: Secondary | ICD-10-CM

## 2019-07-22 DIAGNOSIS — Z7901 Long term (current) use of anticoagulants: Secondary | ICD-10-CM

## 2019-07-22 DIAGNOSIS — I890 Lymphedema, not elsewhere classified: Secondary | ICD-10-CM

## 2019-07-22 DIAGNOSIS — I083 Combined rheumatic disorders of mitral, aortic and tricuspid valves: Secondary | ICD-10-CM

## 2019-07-22 DIAGNOSIS — R1310 Dysphagia, unspecified: Secondary | ICD-10-CM

## 2019-07-22 DIAGNOSIS — I447 Left bundle-branch block, unspecified: Secondary | ICD-10-CM

## 2019-07-22 DIAGNOSIS — G510 Bell's palsy: Secondary | ICD-10-CM

## 2019-07-22 DIAGNOSIS — R634 Abnormal weight loss: Secondary | ICD-10-CM

## 2019-07-22 DIAGNOSIS — B029 Zoster without complications: Secondary | ICD-10-CM

## 2019-07-22 DIAGNOSIS — Z9181 History of falling: Secondary | ICD-10-CM

## 2019-07-22 DIAGNOSIS — Z853 Personal history of malignant neoplasm of breast: Secondary | ICD-10-CM

## 2019-07-22 DIAGNOSIS — D509 Iron deficiency anemia, unspecified: Secondary | ICD-10-CM

## 2019-07-22 DIAGNOSIS — Z96651 Presence of right artificial knee joint: Secondary | ICD-10-CM

## 2019-07-22 DIAGNOSIS — I4821 Permanent atrial fibrillation: Secondary | ICD-10-CM | POA: Diagnosis not present

## 2019-07-22 DIAGNOSIS — G4700 Insomnia, unspecified: Secondary | ICD-10-CM

## 2019-07-22 DIAGNOSIS — Z85828 Personal history of other malignant neoplasm of skin: Secondary | ICD-10-CM

## 2019-07-22 DIAGNOSIS — D631 Anemia in chronic kidney disease: Secondary | ICD-10-CM

## 2019-07-22 DIAGNOSIS — E559 Vitamin D deficiency, unspecified: Secondary | ICD-10-CM

## 2019-07-22 DIAGNOSIS — F329 Major depressive disorder, single episode, unspecified: Secondary | ICD-10-CM

## 2019-07-22 DIAGNOSIS — I130 Hypertensive heart and chronic kidney disease with heart failure and stage 1 through stage 4 chronic kidney disease, or unspecified chronic kidney disease: Secondary | ICD-10-CM | POA: Diagnosis not present

## 2019-07-22 DIAGNOSIS — R221 Localized swelling, mass and lump, neck: Secondary | ICD-10-CM

## 2019-07-22 NOTE — Patient Outreach (Signed)
[facility]) Care Management  07/22/2019  KIELA GOOKIN April 08, 1933 [id number]   Medication Adherence call to Mrs. [place] spoke with patient she is past due on Losartan 100 mg,patient explain she is no longer taking this medication doctor took her off.patient explain she recently got out off the hospital.Mrs. Abila is showing past due under [place].   [place] Management Direct Dial [phone]  Fax [phone] [email or web address]

## 2019-07-25 ENCOUNTER — Ambulatory Visit: Payer: Medicare Other | Admitting: Internal Medicine

## 2019-07-27 DIAGNOSIS — K219 Gastro-esophageal reflux disease without esophagitis: Secondary | ICD-10-CM | POA: Diagnosis not present

## 2019-07-27 DIAGNOSIS — E785 Hyperlipidemia, unspecified: Secondary | ICD-10-CM | POA: Diagnosis not present

## 2019-07-27 DIAGNOSIS — Z7901 Long term (current) use of anticoagulants: Secondary | ICD-10-CM | POA: Diagnosis not present

## 2019-07-27 DIAGNOSIS — G4700 Insomnia, unspecified: Secondary | ICD-10-CM | POA: Diagnosis not present

## 2019-07-27 DIAGNOSIS — E46 Unspecified protein-calorie malnutrition: Secondary | ICD-10-CM | POA: Diagnosis not present

## 2019-07-27 DIAGNOSIS — M1710 Unilateral primary osteoarthritis, unspecified knee: Secondary | ICD-10-CM | POA: Diagnosis not present

## 2019-07-27 DIAGNOSIS — I5023 Acute on chronic systolic (congestive) heart failure: Secondary | ICD-10-CM | POA: Diagnosis not present

## 2019-07-27 DIAGNOSIS — K5732 Diverticulitis of large intestine without perforation or abscess without bleeding: Secondary | ICD-10-CM | POA: Diagnosis not present

## 2019-07-27 DIAGNOSIS — I890 Lymphedema, not elsewhere classified: Secondary | ICD-10-CM | POA: Diagnosis not present

## 2019-07-27 DIAGNOSIS — N183 Chronic kidney disease, stage 3 unspecified: Secondary | ICD-10-CM | POA: Diagnosis not present

## 2019-07-27 DIAGNOSIS — I255 Ischemic cardiomyopathy: Secondary | ICD-10-CM | POA: Diagnosis not present

## 2019-07-27 DIAGNOSIS — J449 Chronic obstructive pulmonary disease, unspecified: Secondary | ICD-10-CM | POA: Diagnosis not present

## 2019-07-27 DIAGNOSIS — D631 Anemia in chronic kidney disease: Secondary | ICD-10-CM | POA: Diagnosis not present

## 2019-07-27 DIAGNOSIS — I130 Hypertensive heart and chronic kidney disease with heart failure and stage 1 through stage 4 chronic kidney disease, or unspecified chronic kidney disease: Secondary | ICD-10-CM | POA: Diagnosis not present

## 2019-07-27 DIAGNOSIS — I5032 Chronic diastolic (congestive) heart failure: Secondary | ICD-10-CM | POA: Diagnosis not present

## 2019-07-27 DIAGNOSIS — I4821 Permanent atrial fibrillation: Secondary | ICD-10-CM | POA: Diagnosis not present

## 2019-07-27 DIAGNOSIS — G510 Bell's palsy: Secondary | ICD-10-CM | POA: Diagnosis not present

## 2019-07-27 DIAGNOSIS — R1310 Dysphagia, unspecified: Secondary | ICD-10-CM | POA: Diagnosis not present

## 2019-07-27 DIAGNOSIS — I2510 Atherosclerotic heart disease of native coronary artery without angina pectoris: Secondary | ICD-10-CM | POA: Diagnosis not present

## 2019-07-27 DIAGNOSIS — E559 Vitamin D deficiency, unspecified: Secondary | ICD-10-CM | POA: Diagnosis not present

## 2019-07-27 DIAGNOSIS — I420 Dilated cardiomyopathy: Secondary | ICD-10-CM | POA: Diagnosis not present

## 2019-07-27 DIAGNOSIS — E538 Deficiency of other specified B group vitamins: Secondary | ICD-10-CM | POA: Diagnosis not present

## 2019-08-01 ENCOUNTER — Other Ambulatory Visit: Payer: Self-pay | Admitting: *Deleted

## 2019-08-01 ENCOUNTER — Other Ambulatory Visit: Payer: Self-pay | Admitting: Pharmacist

## 2019-08-01 NOTE — Patient Outreach (Signed)
[facility]) Care Management  08/01/2019  [name] [name] 1933/06/29 [id number]   Call placed to member to follow up on recovery since discharge and MD visit.  Confirmed she did visit MD, no immediate concerns noted.  Will follow up within the next 4 weeks.  Continues to weigh self daily, weight during last conversation was 159 pounds, today was 154 pounds.  State she only have a few more Eliquis pills left, has not received any information regarding assistance programs for affordability.  This care manager will reach out to [facility] pharmacist to follow up on status of application. Does not report any nausea/vomiting but does state she remains a little weak although she is better.  Her son and sister in law are still helping her when needed.  Denies any urgent concerns at this time, advised to contact this care manager with questions.  Will follow up within the next month.  [facility] CM Care Plan Problem One     Most Recent Value  Care Plan Problem One  Knowledge deficit related to management of chronic medical conditions (HF management and recent SBO)  Role Documenting the Problem One  Care Management [place] for Problem One  Active  [facility] Long Term Goal   Member will not have any ED visits or hospitalizations over the next 31 days   [facility] Long Term Goal Start Date  08/01/19  Interventions for Problem One Long Term Goal  Plan of care reviewed with member, advised and educated on importance of following in effort to decrease risk of ED visits and admissions.  Discussed when to contact MD office prior to visiting Ed  [facility] CM Short Term Goal #1   Member will report accurate management of medications within the next 4 weeks  [facility] CM Short Term Goal #1 Start Date  08/01/19  Interventions for Short Term Goal #1  Medication changes reviewed with member, collaborated with [facility] pharmacist regarding ability to afford Eliquis  [facility] CM Short Term Goal #2   Member will report no significant  weight gain over the next 4 weeks  [facility] CM Short Term Goal #2 Start Date  08/01/19  Interventions for Short Term Goal #2  Reviewed heart failure management interventions with member (daily weights, low sodium diet, and fluid restrictions) and advised of importance of adhering to recommendations in effort to effectively manage heart failure     Valente David, Therapist, sports, MSN [place] Manager [phone]

## 2019-08-01 NOTE — Patient Outreach (Signed)
[facility]) Care Management  [place] 08/01/2019  [name] [name] Feb 23, 1933 [id number]  Communication received from [facility] RN that patient is almost of out Eliquis samples and does not know status of patient assistance program application that she started with cardiology office.    Will route note to pharmacists at office to inquire about samples and Eliquis patient assistance program application.   Ralene Bathe, PharmD, [place] [phone]

## 2019-08-01 NOTE — Progress Notes (Signed)
Colleen  I am forwarding this note to Dr. Allison Quarry nurse, as she would be the one to help with that paperwork.  I believe she is out of the office today, but should be back in tomorrow.    Erasmo Downer

## 2019-08-02 ENCOUNTER — Ambulatory Visit: Payer: Self-pay | Admitting: Pharmacist

## 2019-08-03 ENCOUNTER — Other Ambulatory Visit: Payer: Self-pay | Admitting: Pharmacist

## 2019-08-03 ENCOUNTER — Ambulatory Visit: Payer: Self-pay | Admitting: Pharmacist

## 2019-08-03 ENCOUNTER — Telehealth: Payer: Self-pay

## 2019-08-03 NOTE — Progress Notes (Signed)
[name] Auerbach, MD

## 2019-08-03 NOTE — Progress Notes (Signed)
Sure  Her name is [name] [name] (RN)  (there are a few Ivin Booty Martin's in the system!)

## 2019-08-03 NOTE — Telephone Encounter (Addendum)
Incoming call from Trixie Dredge, RN. Pt needs 2-week supply of samples of Eliquis 5 mg.  Medication Samples have been placed at front desk for pickup  Drug name: ELIQUIS       Strength: 5 MG     Qty: 2 BOXES LOT: [id number]  Exp.Date: 09/2021    Kathy Breach Jannie Doyle 1:23 PM 08/03/2019

## 2019-08-03 NOTE — Patient Outreach (Signed)
[facility]) Care Management  [place] 08/03/2019  [name] [name] [birth year] [id number]  Per RN Raiford Simmonds, patient assistance program application for Eliquis was denied due to patient being over income.  Office has placed 2 weeks of samples at front desk for patient.   Successful call to patient to update her.  She reported she had received the denial letter this week from [employer].  She has appt with cardiology on 12/17 and can discuss if more Eliquis samples needed until January (when co-pay hopefully should go back to ~$45/ month) or substitution to warfarin.    Call placed to [facility] RN to provide update.  Will route note to MD to keep informed.   Am happy to assist again as needed.   Ralene Bathe, PharmD, [place] [phone]

## 2019-08-05 ENCOUNTER — Other Ambulatory Visit: Payer: Self-pay | Admitting: Internal Medicine

## 2019-08-09 DIAGNOSIS — G4700 Insomnia, unspecified: Secondary | ICD-10-CM | POA: Diagnosis not present

## 2019-08-09 DIAGNOSIS — I5023 Acute on chronic systolic (congestive) heart failure: Secondary | ICD-10-CM | POA: Diagnosis not present

## 2019-08-09 DIAGNOSIS — K219 Gastro-esophageal reflux disease without esophagitis: Secondary | ICD-10-CM | POA: Diagnosis not present

## 2019-08-09 DIAGNOSIS — M1710 Unilateral primary osteoarthritis, unspecified knee: Secondary | ICD-10-CM | POA: Diagnosis not present

## 2019-08-09 DIAGNOSIS — E538 Deficiency of other specified B group vitamins: Secondary | ICD-10-CM | POA: Diagnosis not present

## 2019-08-09 DIAGNOSIS — I890 Lymphedema, not elsewhere classified: Secondary | ICD-10-CM | POA: Diagnosis not present

## 2019-08-09 DIAGNOSIS — D631 Anemia in chronic kidney disease: Secondary | ICD-10-CM | POA: Diagnosis not present

## 2019-08-09 DIAGNOSIS — G510 Bell's palsy: Secondary | ICD-10-CM | POA: Diagnosis not present

## 2019-08-09 DIAGNOSIS — I4821 Permanent atrial fibrillation: Secondary | ICD-10-CM | POA: Diagnosis not present

## 2019-08-09 DIAGNOSIS — R1310 Dysphagia, unspecified: Secondary | ICD-10-CM | POA: Diagnosis not present

## 2019-08-09 DIAGNOSIS — N183 Chronic kidney disease, stage 3 unspecified: Secondary | ICD-10-CM | POA: Diagnosis not present

## 2019-08-09 DIAGNOSIS — I130 Hypertensive heart and chronic kidney disease with heart failure and stage 1 through stage 4 chronic kidney disease, or unspecified chronic kidney disease: Secondary | ICD-10-CM | POA: Diagnosis not present

## 2019-08-09 DIAGNOSIS — I255 Ischemic cardiomyopathy: Secondary | ICD-10-CM | POA: Diagnosis not present

## 2019-08-09 DIAGNOSIS — K5732 Diverticulitis of large intestine without perforation or abscess without bleeding: Secondary | ICD-10-CM | POA: Diagnosis not present

## 2019-08-09 DIAGNOSIS — J449 Chronic obstructive pulmonary disease, unspecified: Secondary | ICD-10-CM | POA: Diagnosis not present

## 2019-08-09 DIAGNOSIS — E46 Unspecified protein-calorie malnutrition: Secondary | ICD-10-CM | POA: Diagnosis not present

## 2019-08-09 DIAGNOSIS — Z7901 Long term (current) use of anticoagulants: Secondary | ICD-10-CM | POA: Diagnosis not present

## 2019-08-09 DIAGNOSIS — I2510 Atherosclerotic heart disease of native coronary artery without angina pectoris: Secondary | ICD-10-CM | POA: Diagnosis not present

## 2019-08-09 DIAGNOSIS — E559 Vitamin D deficiency, unspecified: Secondary | ICD-10-CM | POA: Diagnosis not present

## 2019-08-09 DIAGNOSIS — I5032 Chronic diastolic (congestive) heart failure: Secondary | ICD-10-CM | POA: Diagnosis not present

## 2019-08-09 DIAGNOSIS — E785 Hyperlipidemia, unspecified: Secondary | ICD-10-CM | POA: Diagnosis not present

## 2019-08-09 DIAGNOSIS — I420 Dilated cardiomyopathy: Secondary | ICD-10-CM | POA: Diagnosis not present

## 2019-08-16 ENCOUNTER — Ambulatory Visit (HOSPITAL_COMMUNITY): Payer: Medicare Other | Attending: Cardiovascular Disease

## 2019-08-16 ENCOUNTER — Other Ambulatory Visit: Payer: Self-pay

## 2019-08-16 DIAGNOSIS — I4821 Permanent atrial fibrillation: Secondary | ICD-10-CM | POA: Diagnosis not present

## 2019-08-16 DIAGNOSIS — I420 Dilated cardiomyopathy: Secondary | ICD-10-CM | POA: Diagnosis not present

## 2019-08-16 DIAGNOSIS — I5042 Chronic combined systolic (congestive) and diastolic (congestive) heart failure: Secondary | ICD-10-CM

## 2019-08-16 HISTORY — PX: TRANSTHORACIC ECHOCARDIOGRAM: SHX275

## 2019-08-17 DIAGNOSIS — N183 Chronic kidney disease, stage 3 unspecified: Secondary | ICD-10-CM | POA: Diagnosis not present

## 2019-08-17 DIAGNOSIS — M1710 Unilateral primary osteoarthritis, unspecified knee: Secondary | ICD-10-CM | POA: Diagnosis not present

## 2019-08-17 DIAGNOSIS — D631 Anemia in chronic kidney disease: Secondary | ICD-10-CM | POA: Diagnosis not present

## 2019-08-17 DIAGNOSIS — R1310 Dysphagia, unspecified: Secondary | ICD-10-CM | POA: Diagnosis not present

## 2019-08-17 DIAGNOSIS — J449 Chronic obstructive pulmonary disease, unspecified: Secondary | ICD-10-CM | POA: Diagnosis not present

## 2019-08-17 DIAGNOSIS — Z7901 Long term (current) use of anticoagulants: Secondary | ICD-10-CM | POA: Diagnosis not present

## 2019-08-17 DIAGNOSIS — I130 Hypertensive heart and chronic kidney disease with heart failure and stage 1 through stage 4 chronic kidney disease, or unspecified chronic kidney disease: Secondary | ICD-10-CM | POA: Diagnosis not present

## 2019-08-17 DIAGNOSIS — E785 Hyperlipidemia, unspecified: Secondary | ICD-10-CM | POA: Diagnosis not present

## 2019-08-17 DIAGNOSIS — I5023 Acute on chronic systolic (congestive) heart failure: Secondary | ICD-10-CM | POA: Diagnosis not present

## 2019-08-17 DIAGNOSIS — I4821 Permanent atrial fibrillation: Secondary | ICD-10-CM | POA: Diagnosis not present

## 2019-08-17 DIAGNOSIS — G510 Bell's palsy: Secondary | ICD-10-CM | POA: Diagnosis not present

## 2019-08-17 DIAGNOSIS — I890 Lymphedema, not elsewhere classified: Secondary | ICD-10-CM | POA: Diagnosis not present

## 2019-08-17 DIAGNOSIS — K219 Gastro-esophageal reflux disease without esophagitis: Secondary | ICD-10-CM | POA: Diagnosis not present

## 2019-08-17 DIAGNOSIS — I420 Dilated cardiomyopathy: Secondary | ICD-10-CM | POA: Diagnosis not present

## 2019-08-17 DIAGNOSIS — G4700 Insomnia, unspecified: Secondary | ICD-10-CM | POA: Diagnosis not present

## 2019-08-17 DIAGNOSIS — K5732 Diverticulitis of large intestine without perforation or abscess without bleeding: Secondary | ICD-10-CM | POA: Diagnosis not present

## 2019-08-17 DIAGNOSIS — I2510 Atherosclerotic heart disease of native coronary artery without angina pectoris: Secondary | ICD-10-CM | POA: Diagnosis not present

## 2019-08-17 DIAGNOSIS — E538 Deficiency of other specified B group vitamins: Secondary | ICD-10-CM | POA: Diagnosis not present

## 2019-08-17 DIAGNOSIS — E46 Unspecified protein-calorie malnutrition: Secondary | ICD-10-CM | POA: Diagnosis not present

## 2019-08-17 DIAGNOSIS — I255 Ischemic cardiomyopathy: Secondary | ICD-10-CM | POA: Diagnosis not present

## 2019-08-17 DIAGNOSIS — E559 Vitamin D deficiency, unspecified: Secondary | ICD-10-CM | POA: Diagnosis not present

## 2019-08-17 DIAGNOSIS — I5032 Chronic diastolic (congestive) heart failure: Secondary | ICD-10-CM | POA: Diagnosis not present

## 2019-08-18 ENCOUNTER — Encounter: Payer: Self-pay | Admitting: Cardiology

## 2019-08-18 ENCOUNTER — Ambulatory Visit (INDEPENDENT_AMBULATORY_CARE_PROVIDER_SITE_OTHER): Payer: Medicare Other | Admitting: Cardiology

## 2019-08-18 ENCOUNTER — Other Ambulatory Visit: Payer: Self-pay

## 2019-08-18 VITALS — BP 104/68 | HR 76 | Temp 95.7°F | Ht 62.0 in | Wt 160.8 lb

## 2019-08-18 DIAGNOSIS — I4821 Permanent atrial fibrillation: Secondary | ICD-10-CM

## 2019-08-18 DIAGNOSIS — I420 Dilated cardiomyopathy: Secondary | ICD-10-CM

## 2019-08-18 DIAGNOSIS — I5042 Chronic combined systolic (congestive) and diastolic (congestive) heart failure: Secondary | ICD-10-CM

## 2019-08-18 DIAGNOSIS — I10 Essential (primary) hypertension: Secondary | ICD-10-CM

## 2019-08-18 DIAGNOSIS — I350 Nonrheumatic aortic (valve) stenosis: Secondary | ICD-10-CM

## 2019-08-18 DIAGNOSIS — N1831 Chronic kidney disease, stage 3a: Secondary | ICD-10-CM

## 2019-08-18 DIAGNOSIS — R634 Abnormal weight loss: Secondary | ICD-10-CM

## 2019-08-18 LAB — BASIC METABOLIC PANEL
BUN/Creatinine Ratio: 12 (ref 12–28)
BUN: 14 mg/dL (ref 8–27)
CO2: 27 mmol/L (ref 20–29)
Calcium: 8.5 mg/dL — ABNORMAL LOW (ref 8.7–10.3)
Chloride: 97 mmol/L (ref 96–106)
Creatinine, Ser: 1.14 mg/dL — ABNORMAL HIGH (ref 0.57–1.00)
GFR calc Af Amer: 51 mL/min/{1.73_m2} — ABNORMAL LOW (ref >59)
GFR calc non Af Amer: 44 mL/min/{1.73_m2} — ABNORMAL LOW (ref >59)
Glucose: 99 mg/dL (ref 65–99)
Potassium: 3.5 mmol/L (ref 3.5–5.2)
Sodium: 144 mmol/L (ref 134–144)

## 2019-08-18 NOTE — Patient Instructions (Addendum)
Medication Instructions:  NO CHANGES  *If you need a refill on your cardiac medications before your next appointment, please call your pharmacy*  Lab Work: LAB TODAY -- BMP If you have labs (blood work) drawn today and your tests are completely normal, you will receive your results only by: Marland Kitchen MyChart Message (if you have MyChart) OR . A paper copy in the mail If you have any lab test that is abnormal or we need to change your treatment, we will call you to review the results.  Testing/Procedures: NOT NEEDED  Follow-Up: At [facility], you and your health needs are our priority.  As part of our continuing mission to provide you with exceptional heart care, we have created designated Provider Care Teams.  These Care Teams include your primary Cardiologist (physician) and Advanced Practice Providers (APPs -  Physician Assistants and Nurse Practitioners) who all work together to provide you with the care you need, when you need it.  Your next appointment:   3 month(s)  The format for your next appointment:   In Person  Provider:   Glenetta Hew, MD  Other Instructions 1-- You have been referred to  [place]  2-- Your physician recommends that you schedule a follow-up appointment in:  Jan 2021 -  [place]    3- You have been referred to [place]-

## 2019-08-18 NOTE — Progress Notes (Signed)
Primary Care Provider: Cassandria Anger, MD Cardiologist: Glenetta Hew, MD Electrophysiologist:   Clinic Note: Chief Complaint  Patient presents with  . Follow-up    3 months.  . Cardiomyopathy    Nonischemic  . Atrial Fibrillation    Persistent    HPI:    [name] [name] is a 85 y.o. female with a PMH below who presents today for 28-month follow-up persistent atrial fibrillation and now severe dilated cardiomyopathy.  Hospitalized twice in August 2020 with worsening dyspnea and combined heart failure EF reduced down to 20-25% on echo.  Shortly after discharge but bounced right back with worsening dyspnea and required additional diuresis.  [name] [name] was held at that time because of renal insufficiency.[name] [name] was last seen on September 18 for hospitalization follow-up after back-to-back hospitalizations for acute heart failure.  At that time she is feeling pretty well.  Was back doing some housecleaning when extremities.  Recent Hospitalizations:   Admitted 07/05/2019 with small bowel obstruction.  Treated expectantly with NG tube.  No surgery.  Reviewed  CV studies:    The following studies were reviewed today: (if available, images/films reviewed: From Epic Chart or Care Everywhere) . TTE 04/20/2019:: EF 20-25%.  Mild LV dilation.  Was in A. fib, unable to assess diastolic function.  Diffuse HK.  Likely elevated LVEDP.  Akinesis of the basal and mid inferior and anteroseptal inferoapical and anterior apical inferior lateral wall.  Moderate reduced RV function, severely enlarged.  Elevated RV P estimated 55 mmHg.  Severely dilated left atrium.  Cannot rule out left atrial thrombus consider TEE if indicated.  Severe right atrial dilation.  Aortic valve sclerosis, no stenosis.  Cardiac MRI 04/19/2019: Severe LV dilation with global hypokinesis.  EF roughly 23%.  Moderate RV dilation with moderately reduced EF of 33%.  Basal septal mid wall enhancement suggest scar, which  could be an indication of a worse prognosis for nonischemic cardiomyopathy.   . TTE 08/16/2019: Severely calcified aortic valve ventricular motion.  Consider moderate aortic stenosis although cannot exclude low gradient AS.  Severely reduced EF Global HK.  Severe biatrial enlargement.   Interval History:   [name] [name] returns here today overall doing okay from a cardiac standpoint.  She says she is losing weight over the last several months.  She has not really had any significant symptoms of PND orthopnea, sleeps on 2 pillows for comfort.  Basically doing well from a swelling standpoint.  She tells me that basically if she is taking her Lasix according to her current plan, she says that her weights definitively going down and swelling seems to be stable.  Where she does note is that she is just can get tired quite easily.  She does some walking around in the yard and occasionally does some shopping, but is relatively limited as far as activity goes.  One major issue she is having is with the cost of Eliquis.  She is currently in the donut hole and is trying to figure out how to obtain her Eliquis.  She really denies any sensation of irregular heartbeats or palpitations.  No signs of skipped beats or fast heartbeats.  Other than some mild fatigue and decreased exercise tolerance, she is not noticing any chest pain or pressure with rest or exertion.  Obviously if she overdoes any activity she will get short of breath, but no resting dyspnea.  No syncope/near syncope or TIA/amaurosis fugax.  CV Review of Symptoms (Summary): positive for -  dyspnea on exertion and Well-controlled swelling.  Exercise intolerance.  Fatigue negative for - chest pain, irregular heartbeat, orthopnea, palpitations, paroxysmal nocturnal dyspnea, rapid heart rate, shortness of breath or Syncope/near syncope, TIA/amaurosis fugax, claudication.  The patient does not have symptoms concerning for COVID-19 infection (fever,  chills, cough, or new shortness of breath).  The patient is practicing social distancing. ++ Masking.  Rarely goes out for groceries/shopping -> is very cautious.   REVIEWED OF SYSTEMS   A comprehensive ROS was performed. Review of Systems  Constitutional: Positive for malaise/fatigue (Decreased exercise tolerance, no baseline malaise.). Negative for chills and fever.  HENT: Negative for congestion and nosebleeds.   Respiratory: Negative for cough and wheezing.   Cardiovascular:       Per HPI  Gastrointestinal: Negative for blood in stool, heartburn and melena.       Seems to have recovered from her bowel obstruction.  At that time she had nausea and vomiting abdominal pain.  Abdominal bloating etc.  Genitourinary: Negative for hematuria.  Musculoskeletal: Negative for falls and joint pain.  Neurological: Positive for dizziness (Some positional dizziness with standing up too quickly) and weakness (Just feels weaker than she usually has been.  Generalized).  Psychiatric/Behavioral: Negative for depression and memory loss. The patient has insomnia (Not sleeping well.). The patient is not nervous/anxious.    I have reviewed and (if needed) personally updated the patient's problem list, medications, allergies, past medical and surgical history, social and family history.   PAST MEDICAL HISTORY   Past Medical History:  Diagnosis Date  . Acute lymphadenitis of arm   . Anemia    iron deficiency  . Atrial fibrillation ([place])   . Breast cancer ([place])   . Chronic combined systolic and diastolic CHF, NYHA class 2 and ACA/AHA stage C 04/2019   Previously had mildly reduced EF of 45%, reduced to 20-25% as of August 2020 with global hypokinesis and septal-apical akinesis.  . Depression 2009   grief  . Dilated cardiomyopathy ([place]) 04/2019   Presumed to be nonischemic (normal coronary CTA January 2020).  EF by echo 20 to 25% and by CMR 23%.  Severely reduced RV function at 33% EF.Marland Kitchen Severe biatrial  enlargement.  Elevated RV P and LVEDP.  Dilated IVC.Marland Kitchen CMRI suggests septal scar stripe.  . Diverticulosis of colon 2004   Dr Deatra Ina  . GERD (gastroesophageal reflux disease)   . Heart murmur   . Helicobacter pylori gastritis   . Hematuria    Dr Terance Hart  . Hyperlipidemia   . Hypertension   . Osteoarthritis, knee   . Skin cancer   . Vitamin B12 deficiency   . Vitamin D deficiency     PAST SURGICAL HISTORY   Past Surgical History:  Procedure Laterality Date  . ABDOMINAL HYSTERECTOMY    . Cardiac MRI  04/19/2019   Severe LV dilation with global hypokinesis.  EF roughly 23%.  Moderate RV dilation with moderately reduced EF of 33%.  Basal septal mid wall enhancement suggest scar, which could be an indication of a worse prognosis for nonischemic cardiomyopathy.   NO LA mass noted  . CARDIOVERSION N/A 09/06/2018   Procedure: CARDIOVERSION;  Surgeon: Fay Records, MD;  Location: [facility] ENDOSCOPY;  Service: Cardiovascular;  Laterality: N/A; --> after initial success with sinus rhythm, the patient went back into A. fib within 2 minutes.  . CORONARY CT ANGIOGRAM  09/14/2018   Coronary Calcium Score 276.  Mild mixed plaque in the left main with minimal stenosis.  Mixed plaque in the ostial LAD, proximal LCx and moderate size OM1, as well as proximal RCA (<50% stenosis throughout.).  Nonobstructive CAD.  Intermediate risk  . JOINT REPLACEMENT    . L groin skin cancer  2011  . MASTECTOMY  09-13-08   left and right  . TOTAL KNEE ARTHROPLASTY  06-04-98  . TOTAL KNEE ARTHROPLASTY Right 05/24/2014   Procedure: RIGHT TOTAL KNEE ARTHROPLASTY;  Surgeon: Newt Minion, MD;  Location: [place];  Service: Orthopedics;  Laterality: Right;  . TRANSTHORACIC ECHOCARDIOGRAM  08/11/2018   Persistent A. fib: Mildly reduced EF of 45 to 50% but diffuse HK.  If A. fib the entire time.  Moderate LA and moderate to severe RA dilation (suggesting longstanding A. fib, and potentially explaining difficulty cardioversion).  No  significant valve disease.  Aortic sclerosis no stenosis.  Unable to assess diastolic function with A. fib  . TRANSTHORACIC ECHOCARDIOGRAM  04/10/2019   Global hypokinesis with EF of 20-25%.  Evidence of elevated filling pressures noted -> GRII DD.  Basal and mid inferior-anteroseptal and apical akinesis.  Mildly reduced RV function with moderate elevated RVP ~53 milli-mercury.  Severe biatrial dilation.  Moderate aortic sclerosis but no stenosis.  Dilated IVC with normal respiratory variability.  Possible LAA mass noted.  Cannot exclude thrombus.  . TRANSTHORACIC ECHOCARDIOGRAM  08/16/2019   Severely calcified aortic valve ventricular motion.  Consider moderate aortic stenosis although cannot exclude low gradient AS.  Severely reduced EF Global HK.  Severe biatrial enlargement.  (On review, does not appear to be severe stenosis)     MEDICATIONS/ALLERGIES   Current Meds  Medication Sig  . acetaminophen (TYLENOL) 500 MG tablet Take 500 mg by mouth 2 (two) times daily as needed for moderate pain.   Marland Kitchen apixaban (ELIQUIS) 5 MG TABS tablet Take 1 tablet (5 mg total) by mouth 2 (two) times daily.  . bisacodyl (DULCOLAX) 10 MG suppository Place 1 suppository (10 mg total) rectally daily as needed for mild constipation.  . [employer] B-12 500 MCG SUBL Place 2 tablets (1,000 mcg total) under the tongue daily.  Marland Kitchen docusate sodium (COLACE) 100 MG capsule Take 1 capsule (100 mg total) by mouth 2 (two) times daily.  . furosemide (LASIX) 40 MG tablet Take 1 tablet (40 mg total) by mouth daily. MAY AN ADDITIONAL DOES IF WEIGHT IS GREATER THAN 3 LBS OR AS DIRECTED  . metoprolol tartrate (LOPRESSOR) 25 MG tablet Take 0.5 tablets (12.5 mg total) by mouth 2 (two) times daily.  . Multiple Vitamin (MULTIVITAMIN WITH MINERALS) TABS tablet Take 1 tablet by mouth daily.  . pantoprazole (PROTONIX) 40 MG tablet Take 1 tablet (40 mg total) by mouth 2 (two) times daily.  . polyethylene glycol (MIRALAX / GLYCOLAX) 17 g packet Take  17 g by mouth 2 (two) times daily.  . pravastatin (PRAVACHOL) 20 MG tablet Take 1 tablet by mouth once daily  . spironolactone (ALDACTONE) 25 MG tablet Take 0.5 tablets (12.5 mg total) by mouth daily.  . temazepam (RESTORIL) 15 MG capsule Take 1 capsule (15 mg total) by mouth at bedtime as needed for sleep.  Marland Kitchen triamcinolone cream (KENALOG) 0.5 % APPLY  CREAM TOPICALLY TO AFFECTED AREA 4 TIMES DAILY ON RASH (Patient taking differently: Apply 1 application topically 4 (four) times daily. )    Allergies  Allergen Reactions  . Anastrozole Other (See Comments)    arthralgias  . Aspirin Other (See Comments)    Gi upset   . Ciprofloxacin Itching  .  Letrozole Other (See Comments)    leg pain     SOCIAL HISTORY/FAMILY HISTORY   Social History   Tobacco Use  . Smoking status: Never Smoker  . Smokeless tobacco: Never Used  Substance Use Topics  . Alcohol use: No  . Drug use: No   Social History   Social History Narrative  . Not on file    Family History family history includes Atrial fibrillation in an other family member; Cancer in her maternal aunt, sister, and another family member; Coronary artery disease in an other family member.   OBJCTIVE -PE, EKG, labs   Wt Readings from Last 3 Encounters:  08/18/19 160 lb 12.8 oz (72.9 kg)  07/20/19 159 lb (72.1 kg)  07/15/19 175 lb 7.8 oz (79.6 kg)    Physical Exam: BP 104/68 (BP Location: Right Arm, Patient Position: Sitting, Cuff Size: Normal)   Pulse 76   Temp (!) 95.7 F (35.4 C)   Ht 5\' 2"  (1.575 m)   Wt 160 lb 12.8 oz (72.9 kg)   BMI 29.41 kg/m  Physical Exam  Constitutional: She is oriented to person, place, and time. She appears well-developed and well-nourished.  Relatively healthy-appearing elderly woman.  No acute distress.  Well-groomed.  HENT:  Head: Normocephalic and atraumatic.  Neck: JVD (Trivial, 7-8 cmH2O, cannon A waves.) present. No hepatojugular reflux present. Carotid bruit is not present. No  tracheal deviation present. No thyromegaly present.  Cardiovascular: Normal rate, S1 normal, S2 normal and intact distal pulses. An irregularly irregular rhythm present. PMI is not displaced (Difficult to palpate.  Does appear to be some mildly sustained.). Exam reveals distant heart sounds. Exam reveals no gallop.  Murmur heard. High-pitched harsh crescendo-decrescendo midsystolic murmur is present with a grade of 2/6 at the upper right sternal border radiating to the neck. High-pitched blowing holosystolic murmur of grade 1/6 is also present at the apex. Pulmonary/Chest: Effort normal and breath sounds normal. No respiratory distress. She has no wheezes. She has no rales.  Abdominal: Soft. Bowel sounds are normal. She exhibits no distension. There is no abdominal tenderness. There is no rebound.  Musculoskeletal:        General: Edema (Trivial bilateral ankle) present. Normal range of motion.     Cervical back: Normal range of motion and neck supple.  Neurological: She is alert and oriented to person, place, and time.  Psychiatric: She has a normal mood and affect. Her behavior is normal. Judgment and thought content normal.    Adult ECG Report  Rate: 78 ;  Rhythm: atrial fibrillation and premature ventricular contractions (PVC); LVH, left axis deviation.  T wave inversions, inferior leads.  Cannot exclude ischemia.  Narrative Interpretation: Stable EKG  Recent Labs:    Lab Results  Component Value Date   CHOL 210 (H) 04/10/2019   HDL 62 04/10/2019   LDLCALC 138 (H) 04/10/2019   TRIG 54 07/11/2019   CHOLHDL 3.4 04/10/2019   Lab Results  Component Value Date   CREATININE 1.14 (H) 08/18/2019   BUN 14 08/18/2019   NA 144 08/18/2019   K 3.5 08/18/2019   CL 97 08/18/2019   CO2 27 08/18/2019    ASSESSMENT/PLAN    Problem List Items Addressed This Visit    Permanent atrial fibrillation:  CHA2DS2-VASc Score (Eliquis) (Chronic)    Currently on metoprolol tartrate --> needs to be  converted to Toprol 25 mg p.o. daily. ->   Currently on Eliquis.  Having issues with cost/donut hole.  Will refer to  CVRR to discuss different options and patient assistance programs.  Consider switching to Xarelto or Pradaxa.  With severely reduced EF and chronic A. fib, will refer to electrophysiology for consideration of possible ICD, and potential antiarrhythmics/ablation since her EF notably worsened after onset of A. fib last year..      Relevant Orders   EKG 12-Lead (Completed)   Basic metabolic panel (Completed)   Ambulatory referral to Cardiac Electrophysiology   Ambulatory referral to Cardiac Electrophysiology   Chronic combined systolic and diastolic heart failure (HCC) - Primary (Chronic)    Significant reduced EF noted.  CMR.  Likely nonischemic etiology.  Plan: We will convert from Lopressor back to Toprol.  Continue low-dose spironolactone and current standing dose of furosemide 40 mg daily with as needed additional based on sliding scale.  Echo did not show any improvement in EF.  Thankfully, she seems to be relatively euvolemic, but has lost a lot of weight making it difficult to tell her dry weight.  With low blood pressures, I am reluctant to start her on ARB or Entresto lets have the ability to follow-up closely.  With significantly reduced EF and A. fib, and can refer to electrophysiology for assistance with A. fib to consider possible ablation or antiarrhythmic agent.    I am also going to refer to advanced heart failure service for assistance with medication titration.      Relevant Orders   EKG 12-Lead (Completed)   Basic metabolic panel (Completed)   Ambulatory referral to Cardiac Electrophysiology   Ambulatory referral to Cardiac Electrophysiology   Dilated cardiomyopathy ([place]) (Chronic)    History of nonocclusive coronary CTA and CMR suggestive of nonischemic findings.  EF continues to be reduced on echocardiogram.  Because of low blood pressures, we are not  able to fully titrate CHF medications.  Unfortunately when she was in the hospital her medications were changed a little bit.    Need to convert back to Toprol.  Continue spironolactone and standing dose Lasix. Reluctant to initiate ARB/Entresto because of low blood pressures.      Relevant Orders   EKG 12-Lead (Completed)   Ambulatory referral to Cardiac Electrophysiology   Ambulatory referral to Cardiac Electrophysiology   Essential hypertension (Chronic)    Not really an issue.  In fact she is basically hypotensive now.      Weight loss   Relevant Orders   Basic metabolic panel (Completed)   CKD (chronic kidney disease), stage III    Creatinine seems to be relatively stable.  1.14 as of today.  If her blood pressure would tolerate, week we could consider low-dose Entresto or ARB, will defer to CHF team.      Calcific aortic stenosis    Her echocardiograms from last year and in August did not suggest anything about aortic stenosis.  I personally reviewed the images and I do not see severe reduction in aortic valve movement.  There is clearly calcification, but there does not appear to be enough to consider low output Severe AS.  Can continue to follow for now.        COVID-19 Education: The signs and symptoms of COVID-19 were discussed with the patient and how to seek care for testing (follow up with PCP or arrange E-visit).   The importance of social distancing was discussed today.  I spent a total of 21 minutes with the patient and chart review. >  50% of the time was spent in direct patient consultation.  Additional time spent  with chart review (studies, outside notes, etc): 6 Total Time: 27 min   Current medicines are reviewed at length with the patient today.  (+/- concerns) cost of Eliquis.   Patient Instructions / Medication Changes & Studies & Tests Ordered   Patient Instructions  Medication Instructions:  NO CHANGES  *If you need a refill on your cardiac  medications before your next appointment, please call your pharmacy*  Lab Work: LAB TODAY -- BMP If you have labs (blood work) drawn today and your tests are completely normal, you will receive your results only by: Marland Kitchen MyChart Message (if you have MyChart) OR . A paper copy in the mail If you have any lab test that is abnormal or we need to change your treatment, we will call you to review the results.  Testing/Procedures: NOT NEEDED  Follow-Up: At [facility], you and your health needs are our priority.  As part of our continuing mission to provide you with exceptional heart care, we have created designated Provider Care Teams.  These Care Teams include your primary Cardiologist (physician) and Advanced Practice Providers (APPs -  Physician Assistants and Nurse Practitioners) who all work together to provide you with the care you need, when you need it.  Your next appointment:   3 month(s)  The format for your next appointment:   In Person  Provider:   Glenetta Hew, MD  Other Instructions 1-- You have been referred to  [place]  2-- Your physician recommends that you schedule a follow-up appointment in:  Jan 2021 -  [place]    3- You have been referred to [place]-     Studies Ordered:   Orders Placed This Encounter  Procedures  . Basic metabolic panel  . Ambulatory referral to Cardiac Electrophysiology  . Ambulatory referral to Cardiac Electrophysiology  . EKG 12-Lead     Glenetta Hew, M.D., M.S. Interventional Cardiologist   Pager # [phone] Phone # [phone] [address] [place], [place] [address]   Thank you for choosing Heartcare at [facility]!!

## 2019-08-22 ENCOUNTER — Telehealth: Payer: Self-pay | Admitting: *Deleted

## 2019-08-22 NOTE — Telephone Encounter (Signed)
Called  No answer - phone rings will try again

## 2019-08-22 NOTE — Telephone Encounter (Signed)
-----   Message from Leonie Man, MD sent at 08/17/2019 12:24 AM EST ----- Unfortunately, echocardiogram continues to show pump function of 20 to 25% which is severely reduced.The aortic valve shows signs of moderate narrowing, but cannot exclude a more significant aortic valve stenosis that is underestimated due to the low pump function.  Also noted moderate mitral regurgitation.  This is unfortunate because it shows that there is been no improvement in the overall pump function.  We need to continue to titrate medicines to help the heart work more efficiently.  Once that is started, we can then look into doing additional therapies through electrophysiology for the atrial fibrillation.   Really needs to be discussed in follow-up visit.  Glenetta Hew, MD

## 2019-08-22 NOTE — Telephone Encounter (Signed)
-----   Message from Leonie Man, MD sent at 08/19/2019  1:50 PM EST ----- Faythe Ghee, kidney function looks much better.  Just about back to her baseline.  Otherwise chemistries look good  Glenetta Hew, MD

## 2019-08-23 NOTE — Telephone Encounter (Signed)
The patient has been notified of the result-echo,labs and verbalized understanding.  All questions (if any) were answered. Raiford Simmonds, RN 08/23/2019 3:42 PM

## 2019-08-24 ENCOUNTER — Encounter: Payer: Self-pay | Admitting: Cardiology

## 2019-08-24 NOTE — Assessment & Plan Note (Signed)
History of nonocclusive coronary CTA and CMR suggestive of nonischemic findings.  EF continues to be reduced on echocardiogram.  Because of low blood pressures, we are not able to fully titrate CHF medications.  Unfortunately when she was in the hospital her medications were changed a little bit.    Need to convert back to Toprol.  Continue spironolactone and standing dose Lasix. Reluctant to initiate ARB/Entresto because of low blood pressures.

## 2019-08-24 NOTE — Assessment & Plan Note (Signed)
Her echocardiograms from last year and in August did not suggest anything about aortic stenosis.  I personally reviewed the images and I do not see severe reduction in aortic valve movement.  There is clearly calcification, but there does not appear to be enough to consider low output Severe AS.  Can continue to follow for now.

## 2019-08-24 NOTE — Assessment & Plan Note (Signed)
Significant reduced EF noted.  CMR.  Likely nonischemic etiology.  Plan: We will convert from Lopressor back to Toprol.  Continue low-dose spironolactone and current standing dose of furosemide 40 mg daily with as needed additional based on sliding scale.  Echo did not show any improvement in EF.  Thankfully, she seems to be relatively euvolemic, but has lost a lot of weight making it difficult to tell her dry weight.  With low blood pressures, I am reluctant to start her on ARB or Entresto lets have the ability to follow-up closely.  With significantly reduced EF and A. fib, and can refer to electrophysiology for assistance with A. fib to consider possible ablation or antiarrhythmic agent.    I am also going to refer to advanced heart failure service for assistance with medication titration.

## 2019-08-24 NOTE — Assessment & Plan Note (Signed)
Creatinine seems to be relatively stable.  1.14 as of today.  If her blood pressure would tolerate, week we could consider low-dose Entresto or ARB, will defer to CHF team.

## 2019-08-24 NOTE — Assessment & Plan Note (Signed)
Currently on metoprolol tartrate --> needs to be converted to Toprol 25 mg p.o. daily. ->   Currently on Eliquis.  Having issues with cost/donut hole.  Will refer to CVRR to discuss different options and patient assistance programs.  Consider switching to Xarelto or Pradaxa.  With severely reduced EF and chronic A. fib, will refer to electrophysiology for consideration of possible ICD, and potential antiarrhythmics/ablation since her EF notably worsened after onset of A. fib last year.[name] [name]

## 2019-08-24 NOTE — Assessment & Plan Note (Signed)
Not really an issue.  In fact she is basically hypotensive now.

## 2019-08-30 DIAGNOSIS — G4700 Insomnia, unspecified: Secondary | ICD-10-CM | POA: Diagnosis not present

## 2019-08-30 DIAGNOSIS — M1710 Unilateral primary osteoarthritis, unspecified knee: Secondary | ICD-10-CM | POA: Diagnosis not present

## 2019-08-30 DIAGNOSIS — D631 Anemia in chronic kidney disease: Secondary | ICD-10-CM | POA: Diagnosis not present

## 2019-08-30 DIAGNOSIS — G510 Bell's palsy: Secondary | ICD-10-CM | POA: Diagnosis not present

## 2019-08-30 DIAGNOSIS — I130 Hypertensive heart and chronic kidney disease with heart failure and stage 1 through stage 4 chronic kidney disease, or unspecified chronic kidney disease: Secondary | ICD-10-CM | POA: Diagnosis not present

## 2019-08-30 DIAGNOSIS — E538 Deficiency of other specified B group vitamins: Secondary | ICD-10-CM | POA: Diagnosis not present

## 2019-08-30 DIAGNOSIS — Z7901 Long term (current) use of anticoagulants: Secondary | ICD-10-CM | POA: Diagnosis not present

## 2019-08-30 DIAGNOSIS — E785 Hyperlipidemia, unspecified: Secondary | ICD-10-CM | POA: Diagnosis not present

## 2019-08-30 DIAGNOSIS — N183 Chronic kidney disease, stage 3 unspecified: Secondary | ICD-10-CM | POA: Diagnosis not present

## 2019-08-30 DIAGNOSIS — I4821 Permanent atrial fibrillation: Secondary | ICD-10-CM | POA: Diagnosis not present

## 2019-08-30 DIAGNOSIS — I2510 Atherosclerotic heart disease of native coronary artery without angina pectoris: Secondary | ICD-10-CM | POA: Diagnosis not present

## 2019-08-30 DIAGNOSIS — I5023 Acute on chronic systolic (congestive) heart failure: Secondary | ICD-10-CM | POA: Diagnosis not present

## 2019-08-30 DIAGNOSIS — R1310 Dysphagia, unspecified: Secondary | ICD-10-CM | POA: Diagnosis not present

## 2019-08-30 DIAGNOSIS — J449 Chronic obstructive pulmonary disease, unspecified: Secondary | ICD-10-CM | POA: Diagnosis not present

## 2019-08-30 DIAGNOSIS — K219 Gastro-esophageal reflux disease without esophagitis: Secondary | ICD-10-CM | POA: Diagnosis not present

## 2019-08-30 DIAGNOSIS — E46 Unspecified protein-calorie malnutrition: Secondary | ICD-10-CM | POA: Diagnosis not present

## 2019-08-30 DIAGNOSIS — I420 Dilated cardiomyopathy: Secondary | ICD-10-CM | POA: Diagnosis not present

## 2019-08-30 DIAGNOSIS — I255 Ischemic cardiomyopathy: Secondary | ICD-10-CM | POA: Diagnosis not present

## 2019-08-30 DIAGNOSIS — I5032 Chronic diastolic (congestive) heart failure: Secondary | ICD-10-CM | POA: Diagnosis not present

## 2019-08-30 DIAGNOSIS — I890 Lymphedema, not elsewhere classified: Secondary | ICD-10-CM | POA: Diagnosis not present

## 2019-08-30 DIAGNOSIS — K5732 Diverticulitis of large intestine without perforation or abscess without bleeding: Secondary | ICD-10-CM | POA: Diagnosis not present

## 2019-08-30 DIAGNOSIS — E559 Vitamin D deficiency, unspecified: Secondary | ICD-10-CM | POA: Diagnosis not present

## 2019-08-31 ENCOUNTER — Other Ambulatory Visit: Payer: Self-pay | Admitting: *Deleted

## 2019-08-31 NOTE — Patient Outreach (Signed)
[facility]) Care Management  08/31/2019  NOA GALVAO 25-Jul-1933 [id number]   Call placed to member to follow up on current management of health.  State she has improved, eating more and denies any nausea/vomiting.  Denies any further signs of obstruction.  Report her weight has been up/down, denies shortness of breath or chest discomfort.  She does have some leg swelling at times but report it is relieved with elevation.  She has also started wearing compression socks.  Report she has enough Eliquis, stating that her friend paid her copay this last month.  Advised that if she continues to have problems with affordability starting Jan. 1 to contact this care manager and/or [facility] pharmacist for assistance.    She has appointment with cardiology on 1/6 do discuss further management of heart failure, there is possibility of ICD insertion.  Her son will provide transportation to appointment. She denies any urgent concerns, will follow up within the next 2 weeks.  Advised to contact this care manager with questions.  THN CM Care Plan Problem One     Most Recent Value  Care Plan Problem One  Knowledge deficit related to management of chronic medical conditions (HF management and recent SBO)  Role Documenting the Problem One  Care Management [place] for Problem One  Active  THN Long Term Goal   Member will not have any ED visits or hospitalizations over the next 31 days   THN Long Term Goal Start Date  08/01/19  [facility] Long Term Goal Met Date  08/31/19  [facility] CM Short Term Goal #1   Member will report accurate management of medications within the next 4 weeks  THN CM Short Term Goal #1 Start Date  08/01/19  [facility] CM Short Term Goal #1 Met Date  08/31/19  THN CM Short Term Goal #2   Member will report no significant weight gain over the next 4 weeks  THN CM Short Term Goal #2 Start Date  08/01/19  [facility] CM Short Term Goal #2 Met Date  08/31/19     Valente David, RN,  MSN [place] [phone]

## 2019-09-06 NOTE — Progress Notes (Signed)
ELECTROPHYSIOLOGY CONSULT NOTE  Patient ID: [name] [name], MRN: [id number], DOB/AGE: 84-Oct-1934 84 y.o. Admit date: (Not on file) Date of Consult: 09/07/2019  Primary Physician: Cassandria Anger, MD Primary Cardiologist: [facility]     [name] [name] is a 86 y.o. female who is being seen today for the evaluation of ICD at the request of [facility].    HPI [name] [name] is a 86 y.o. female referred for consideration of an ICD.  When I asked her why she was here her response was that emphatic "I do not want surgery "  She has a history of congestive heart failure with hospitalizations 8/20 at which time it was noted that her previously normal ejection fraction was significantly worsened.  She was also noted to be in atrial fibrillation with variable rates.  She has dyspnea on exertion but this is improved.  She has 2 pillow orthopnea intermittent edema.  Denies chest pain.  Pressure was at 630 subsequent to get 515 DATE TEST EF   12/19 Echo   45-*50 %   8/20 Echo   20-25 % RV dysfunction; RVE BAE (L-23mm--60 mL/m2 )  8/20 cMRI 23% LGE +  12/20 Echo  20-25% ? lowflow AS (AVA 0.9cm2)    Date Cr K Hgb  12/20 1.14 3.5 13.6 (11/20)         Denies dyspnea.  Some peripheral edema particularly her lateral of the left leg.  No syncope or palpitations.   ECGs 9/20 afib CVR 11/20 AFib RVR 12/20 Afib CVR with PVCs   Past Medical History:  Diagnosis Date  . Acute lymphadenitis of arm   . Anemia    iron deficiency  . Atrial fibrillation ([place])   . Breast cancer ([place])   . Chronic combined systolic and diastolic CHF, NYHA class 2 and ACA/AHA stage C 04/2019   Previously had mildly reduced EF of 45%, reduced to 20-25% as of August 2020 with global hypokinesis and septal-apical akinesis.  . Depression 2009   grief  . Dilated cardiomyopathy ([place]) 04/2019   Presumed to be nonischemic (normal coronary CTA January 2020).  EF by echo 20 to 25% and by CMR 23%.  Severely reduced RV function at  33% EF.Marland Kitchen Severe biatrial enlargement.  Elevated RV P and LVEDP.  Dilated IVC.Marland Kitchen CMRI suggests septal scar stripe.  . Diverticulosis of colon 2004   Dr Deatra Ina  . GERD (gastroesophageal reflux disease)   . Heart murmur   . Helicobacter pylori gastritis   . Hematuria    Dr Terance Hart  . Hyperlipidemia   . Hypertension   . Osteoarthritis, knee   . Skin cancer   . Vitamin B12 deficiency   . Vitamin D deficiency       Surgical History:  Past Surgical History:  Procedure Laterality Date  . ABDOMINAL HYSTERECTOMY    . Cardiac MRI  04/19/2019   Severe LV dilation with global hypokinesis.  EF roughly 23%.  Moderate RV dilation with moderately reduced EF of 33%.  Basal septal mid wall enhancement suggest scar, which could be an indication of a worse prognosis for nonischemic cardiomyopathy.   NO LA mass noted  . CARDIOVERSION N/A 09/06/2018   Procedure: CARDIOVERSION;  Surgeon: Fay Records, MD;  Location: [facility] ENDOSCOPY;  Service: Cardiovascular;  Laterality: N/A; --> after initial success with sinus rhythm, the patient went back into A. fib within 2 minutes.  . CORONARY CT ANGIOGRAM  09/14/2018   Coronary Calcium Score 276.  Mild mixed plaque in the left main with minimal stenosis.  Mixed plaque in the ostial LAD, proximal LCx and moderate size OM1, as well as proximal RCA (<50% stenosis throughout.).  Nonobstructive CAD.  Intermediate risk  . JOINT REPLACEMENT    . L groin skin cancer  2011  . MASTECTOMY  09-13-08   left and right  . TOTAL KNEE ARTHROPLASTY  06-04-98  . TOTAL KNEE ARTHROPLASTY Right 05/24/2014   Procedure: RIGHT TOTAL KNEE ARTHROPLASTY;  Surgeon: Newt Minion, MD;  Location: [place];  Service: Orthopedics;  Laterality: Right;  . TRANSTHORACIC ECHOCARDIOGRAM  08/11/2018   Persistent A. fib: Mildly reduced EF of 45 to 50% but diffuse HK.  If A. fib the entire time.  Moderate LA and moderate to severe RA dilation (suggesting longstanding A. fib, and potentially explaining difficulty  cardioversion).  No significant valve disease.  Aortic sclerosis no stenosis.  Unable to assess diastolic function with A. fib  . TRANSTHORACIC ECHOCARDIOGRAM  04/10/2019   Global hypokinesis with EF of 20-25%.  Evidence of elevated filling pressures noted -> GRII DD.  Basal and mid inferior-anteroseptal and apical akinesis.  Mildly reduced RV function with moderate elevated RVP ~53 milli-mercury.  Severe biatrial dilation.  Moderate aortic sclerosis but no stenosis.  Dilated IVC with normal respiratory variability.  Possible LAA mass noted.  Cannot exclude thrombus.  . TRANSTHORACIC ECHOCARDIOGRAM  08/16/2019   Severely calcified aortic valve ventricular motion.  Consider moderate aortic stenosis although cannot exclude low gradient AS.  Severely reduced EF Global HK.  Severe biatrial enlargement.  (On review, does not appear to be severe stenosis)     Home Meds: Current Meds  Medication Sig  . acetaminophen (TYLENOL) 500 MG tablet Take 500 mg by mouth 2 (two) times daily as needed for moderate pain.   Marland Kitchen apixaban (ELIQUIS) 5 MG TABS tablet Take 1 tablet (5 mg total) by mouth 2 (two) times daily.  . bisacodyl (DULCOLAX) 10 MG suppository Place 1 suppository (10 mg total) rectally daily as needed for mild constipation.  . CV[employer] B-12 500 MCG SUBL Place 2 tablets (1,000 mcg total) under the tongue daily.  Marland Kitchen docusate sodium (COLACE) 100 MG capsule Take 1 capsule (100 mg total) by mouth 2 (two) times daily.  . furosemide (LASIX) 40 MG tablet Take 1 tablet (40 mg total) by mouth daily. MAY AN ADDITIONAL DOES IF WEIGHT IS GREATER THAN 3 LBS OR AS DIRECTED  . Multiple Vitamin (MULTIVITAMIN WITH MINERALS) TABS tablet Take 1 tablet by mouth daily.  . pantoprazole (PROTONIX) 40 MG tablet Take 1 tablet (40 mg total) by mouth 2 (two) times daily.  . polyethylene glycol (MIRALAX / GLYCOLAX) 17 g packet Take 17 g by mouth 2 (two) times daily.  . pravastatin (PRAVACHOL) 20 MG tablet Take 1 tablet by mouth once  daily  . temazepam (RESTORIL) 15 MG capsule Take 1 capsule (15 mg total) by mouth at bedtime as needed for sleep.  Marland Kitchen triamcinolone cream (KENALOG) 0.5 % Apply 1 application topically as needed.  . [DISCONTINUED] metoprolol tartrate (LOPRESSOR) 25 MG tablet Take 0.5 tablets (12.5 mg total) by mouth 2 (two) times daily.    Allergies:  Allergies  Allergen Reactions  . Anastrozole Other (See Comments)    arthralgias  . Aspirin Other (See Comments)    Gi upset   . Ciprofloxacin Itching  . Letrozole Other (See Comments)    leg pain    Social History   Socioeconomic History  . Marital  status: Widowed    Spouse name: Not on file  . Number of children: 7  . Years of education: Not on file  . Highest education level: Not on file  Occupational History  . Not on file  Tobacco Use  . Smoking status: Never Smoker  . Smokeless tobacco: Never Used  Substance and Sexual Activity  . Alcohol use: No  . Drug use: No  . Sexual activity: Never  Other Topics Concern  . Not on file  Social History Narrative  . Not on file   Social Determinants of Health   Financial Resource Strain:   . Difficulty of Paying Living Expenses: Not on file  Food Insecurity:   . Worried About Charity fundraiser in the Last Year: Not on file  . Ran Out of Food in the Last Year: Not on file  Transportation Needs:   . Lack of Transportation (Medical): Not on file  . Lack of Transportation (Non-Medical): Not on file  Physical Activity:   . Days of Exercise per Week: Not on file  . Minutes of Exercise per Session: Not on file  Stress:   . Feeling of Stress : Not on file  Social Connections:   . Frequency of Communication with Friends and Family: Not on file  . Frequency of Social Gatherings with Friends and Family: Not on file  . Attends Religious Services: Not on file  . Active Member of Clubs or Organizations: Not on file  . Attends Archivist Meetings: Not on file  . Marital Status: Not on  file  Intimate Partner Violence:   . Fear of Current or Ex-Partner: Not on file  . Emotionally Abused: Not on file  . Physically Abused: Not on file  . Sexually Abused: Not on file     Family History  Problem Relation Age of Onset  . Cancer Sister        breast  . Cancer Maternal Aunt        breast  . Coronary artery disease Other   . Cancer Other        aunt, niece  . Atrial fibrillation Other      ROS:  Please see the history of present illness.     All other systems reviewed and negative.    Physical Exam:  Blood pressure 122/74, pulse 62, height 5\' 4"  (1.626 m), weight 168 lb 6.4 oz (76.4 kg), SpO2 97 %. General: Well developed, well nourished female in no acute distress. Head: Normocephalic, atraumatic, sclera non-icteric, no xanthomas, nares are without discharge. EENT: normal  Lymph Nodes:  none Neck: Negative for carotid bruits. JVD < 10 (evaluated sitting up) Back:without scoliosis kyphosis Lungs: Clear bilaterally to auscultation without wheezes, rales, or rhonchi. Breathing is unlabored. Heart: Irregularly irregular rate and rhythm 2/6  murmur . No rubs, or gallops appreciated. Abdomen: Soft, non-tender, non-distended with normoactive bowel sounds. No hepatomegaly. No rebound/guarding. No obvious abdominal masses. Msk:  Strength and tone appear normal for age. Extremities: No clubbing or cyanosis. No  edema.  Distal pedal pulses are 2+ and equal bilaterally. Skin: Warm and Dry Neuro: Alert and oriented X 3. CN III-XII intact Grossly normal sensory and motor function . Psych:  Responds to questions appropriately with a normal affect.      Labs: Cardiac Enzymes No results for input(s): CKTOTAL, CKMB, TROPONINI in the last 72 hours. CBC Lab Results  Component Value Date   WBC 10.6 (H) 07/14/2019   HGB 13.6 07/14/2019  HCT 42.7 07/14/2019   MCV 91.2 07/14/2019   PLT 173 07/14/2019   PROTIME: No results for input(s): LABPROT, INR in the last 72  hours. Chemistry No results for input(s): NA, K, CL, CO2, BUN, CREATININE, CALCIUM, PROT, BILITOT, ALKPHOS, ALT, AST, GLUCOSE in the last 168 hours.  Invalid input(s): LABALBU Lipids Lab Results  Component Value Date   CHOL 210 (H) 04/10/2019   HDL 62 04/10/2019   LDLCALC 138 (H) 04/10/2019   TRIG 54 07/11/2019   BNP No results found for: PROBNP Thyroid Function Tests: No results for input(s): TSH, T4TOTAL, T3FREE, THYROIDAB in the last 72 hours.  Invalid input(s): FREET3 Miscellaneous Lab Results  Component Value Date   DDIMER 1.29 (H) 04/09/2019    Radiology/Studies:  ECHOCARDIOGRAM COMPLETE  Result Date: 08/16/2019   ECHOCARDIOGRAM REPORT   Patient Name:   [name] [name] Date of Exam: 08/16/2019 Medical Rec #:  [id number]       Height:       62.0 in Accession #:    [id number]      Weight:       159.0 lb Date of Birth:  [birth year]       BSA:          1.73 m Patient Age:    55 years        BP:           118/78 mmHg Patient Gender: F               HR:           77 bpm. Exam Location:  [address] Procedure: 2D Echo, Cardiac Doppler and Color Doppler Indications:    I48.21  History:        Patient has prior history of Echocardiogram examinations, most                 recent 04/10/2019. Dilated cardiomyopathy, Arrythmias:Atrial                 Fibrillation; Risk Factors:Hypertension and Dyslipidemia.  Sonographer:    Coralyn Helling RDCS Referring Phys: [place]  1. There is at least moderate calcific aortic stenosis. There is AoV is severely calcified with restricted leaflet motion. Vmax 1.84 m/s, MG 7.1 mmHG, AVA 0.90 cm2. The Vmax and MG are severely underesimated due to severely reduced LVEF (LVOT VTI 8.9 cm, SV 28 cc). The DI is 0.28 which is consistent with moderate AS. Overall, there is classical low-flow low-gradient aortic stenosis, likely in the moderate range.  2. Left ventricular ejection fraction, by visual estimation, is 20 to 25%. The left ventricle  has severely decreased function. There is no left ventricular hypertrophy.  3. Left ventricular diastolic function could not be evaluated.  4. The left ventricle demonstrates global hypokinesis.  5. Global right ventricle has moderately reduced systolic function.The right ventricular size is mildly enlarged. No increase in right ventricular wall thickness.  6. Left atrial size was severely dilated.  7. Right atrial size was severely dilated.  8. The mitral valve is degenerative. Moderate mitral valve regurgitation.  9. The tricuspid valve is grossly normal. Tricuspid valve regurgitation moderate. 10. The aortic valve is tricuspid. Aortic valve regurgitation is trivial. Moderate aortic valve stenosis. 11. There is severe calcifcation of the aortic valve. 12. There is severe thickening of the aortic valve. 13. The pulmonic valve was grossly normal. Pulmonic valve regurgitation is trivial. 14. Mildly elevated pulmonary artery systolic pressure. 15. The tricuspid regurgitant velocity  is 2.76 m/s, and with an assumed right atrial pressure of 8 mmHg, the estimated right ventricular systolic pressure is mildly elevated at 38.5 mmHg. 16. The inferior vena cava is normal in size with <50% respiratory variability, suggesting right atrial pressure of 8 mmHg. 17. A prior study was performed on 04/10/2019. 18. No significant change from prior study. FINDINGS  Left Ventricle: Left ventricular ejection fraction, by visual estimation, is 20 to 25%. The left ventricle has severely decreased function. The left ventricle demonstrates global hypokinesis. The left ventricular internal cavity size was the left ventricle is normal in size. There is no left ventricular hypertrophy. The left ventricular diastology could not be evaluated due to atrial fibrillation. Left ventricular diastolic function could not be evaluated. Right Ventricle: The right ventricular size is mildly enlarged. No increase in right ventricular wall thickness. Global  RV systolic function is has moderately reduced systolic function. The tricuspid regurgitant velocity is 2.76 m/s, and with an assumed right atrial pressure of 8 mmHg, the estimated right ventricular systolic pressure is mildly elevated at 38.5 mmHg. Left Atrium: Left atrial size was severely dilated. Right Atrium: Right atrial size was severely dilated Pericardium: There is no evidence of pericardial effusion. Mitral Valve: The mitral valve is degenerative in appearance. Moderate mitral valve regurgitation. Tricuspid Valve: The tricuspid valve is grossly normal. Tricuspid valve regurgitation moderate. Aortic Valve: The aortic valve is tricuspid. . There is severe thickening and severe calcifcation of the aortic valve. Aortic valve regurgitation is trivial. Aortic regurgitation PHT measures 516 msec. Moderate aortic stenosis is present. There is severe  thickening of the aortic valve. There is severe calcifcation of the aortic valve. Aortic valve mean gradient measures 7.1 mmHg. Aortic valve peak gradient measures 13.5 mmHg. Aortic valve area, by VTI measures 0.89 cm. There is at least moderate calcific aortic stenosis. There is AoV is severely calcified with restricted leaflet motion. Vmax 1.84 m/s, MG 7.1 mmHG, AVA 0.90 cm2. The Vmax and MG are severely underesimated due to severely reduced LVEF (LVOT VTI 8.9 cm, SV 28 cc). The DI is 0.28 which is consistent with moderate AS. Overall, there is classical low-flow low-gradient aortic stenosis, likely in the moderate range. Pulmonic Valve: The pulmonic valve was grossly normal. Pulmonic valve regurgitation is trivial. Pulmonic regurgitation is trivial. Aorta: The aortic root and ascending aorta are structurally normal, with no evidence of dilitation. Venous: The inferior vena cava is normal in size with less than 50% respiratory variability, suggesting right atrial pressure of 8 mmHg. IAS/Shunts: No atrial level shunt detected by color flow Doppler. Additional  Comments: A prior study was performed on 04/10/2019.  LEFT VENTRICLE PLAX 2D LVIDd:         5.50 cm LVIDs:         4.90 cm LV PW:         1.00 cm LV IVS:        1.10 cm LVOT diam:     2.00 cm LV SV:         35 ml LV SV Index:   19.23 LVOT Area:     3.14 cm  RIGHT VENTRICLE            IVC RVSP:           33.5 mmHg  IVC diam: 1.40 cm LEFT ATRIUM             Index       RIGHT ATRIUM  Index LA diam:        4.90 cm 2.83 cm/m  RA Pressure: 3.00 mmHg LA Vol (A2C):   90.6 ml 52.25 ml/m RA Area:     23.80 cm LA Vol (A4C):   84.5 ml 48.73 ml/m RA Volume:   75.20 ml  43.37 ml/m LA Biplane Vol: 89.5 ml 51.61 ml/m  AORTIC VALVE AV Area (Vmax):    0.87 cm AV Area (Vmean):   0.81 cm AV Area (VTI):     0.89 cm AV Vmax:           183.99 cm/s AV Vmean:          125.003 cm/s AV VTI:            0.314 m AV Peak Grad:      13.5 mmHg AV Mean Grad:      7.1 mmHg LVOT Vmax:         51.22 cm/s LVOT Vmean:        32.420 cm/s LVOT VTI:          0.089 m LVOT/AV VTI ratio: 0.28 AI PHT:            516 msec  AORTA Ao Root diam: 3.40 cm Ao Asc diam:  3.60 cm MITRAL VALVE                        TRICUSPID VALVE MV Area (PHT):                      TR Peak grad:   30.5 mmHg                                     TR Vmax:        289.00 cm/s MV Decel Time: 192 msec             Estimated RAP:  3.00 mmHg MV E velocity: 105.58 cm/s 103 cm/s RVSP:           33.5 mmHg                                      SHUNTS                                     Systemic VTI:  0.09 m                                     Systemic Diam: 2.00 cm  Eleonore Chiquito MD Electronically signed by Eleonore Chiquito MD Signature Date/Time: 08/16/2019/3:27:34 PM    Final     EKG: 08/18/2019  Personally reviewed  atrial fibrillation at 47  -/13/43 PVC-isolated  ECG Personally reviewed  11/20 atrial fibrillation 96 without PVCs  Assessment and Plan:  Cardiomyopathy-progressive presumably nonischemic  Atrial fibrillation-permanent  Congestive heart  failure-chronic-systolic class II  PVCs?  Frequency  Biatrial enlargement-severe  Aortic stenosis-mild-severe (question low gradient AS)  RV dysfunction  Renal insufficiency   The patient presents for consideration of an ICD.  She is averse to the idea of her procedure.  This tracks well as given her age, would not recommend an  ICD for primary prevention.  With her cardiomyopathy, have taken the liberty of switching her Lopressor to carvedilol based on the COMET TRIAL We will also undertake a monitor to look at her PVC burden and heart rate excursion for potentially reversible causes of her cardiomyopathy.  Renal insufficiency had precluded the use of ARB use.  Perhaps it can be reused will defer to Dr. Laray Anger

## 2019-09-07 ENCOUNTER — Ambulatory Visit (INDEPENDENT_AMBULATORY_CARE_PROVIDER_SITE_OTHER): Payer: Medicare Other | Admitting: Internal Medicine

## 2019-09-07 ENCOUNTER — Other Ambulatory Visit: Payer: Self-pay

## 2019-09-07 ENCOUNTER — Encounter: Payer: Self-pay | Admitting: Internal Medicine

## 2019-09-07 ENCOUNTER — Telehealth: Payer: Self-pay

## 2019-09-07 VITALS — BP 122/74 | HR 62 | Ht 64.0 in | Wt 168.4 lb

## 2019-09-07 DIAGNOSIS — I420 Dilated cardiomyopathy: Secondary | ICD-10-CM | POA: Diagnosis not present

## 2019-09-07 DIAGNOSIS — I4821 Permanent atrial fibrillation: Secondary | ICD-10-CM | POA: Diagnosis not present

## 2019-09-07 MED ORDER — CARVEDILOL 3.125 MG PO TABS: 3.1250 mg | ORAL_TABLET | Freq: Two times a day (BID) | ORAL | 3 refills | Status: AC

## 2019-09-07 NOTE — Telephone Encounter (Signed)
Copied from [place] [phone]. Topic: General - Other >> Sep 07, 2019  3:09 PM Leward Quan A wrote: Reason for CRM: Katherina Right nurse with [facility] called to inform Dr Alain Marion that patient refused visits for this week due to appointments but then when she offered an alternate day patient told her to call on [place]. Just to inform... Ph# [phone]

## 2019-09-07 NOTE — Patient Instructions (Signed)
Medication Instructions:  Your physician has recommended you make the following change in your medication:   Stop taking your Metoprolol Tartrate  Begin taking Coreg 3.125mg  twice daily by mouth  *If you need a refill on your cardiac medications before your next appointment, please call your pharmacy*  Lab Work: None ordered.  If you have labs (blood work) drawn today and your tests are completely normal, you will receive your results only by: Marland Kitchen MyChart Message (if you have MyChart) OR . A paper copy in the mail If you have any lab test that is abnormal or we need to change your treatment, we will call you to review the results.  Testing/Procedures: Your physician has recommended that you wear a Zio Monitor. Zio monitors are medical devices that record the heart's electrical activity. Doctors most often use these monitors to diagnose arrhythmias. Arrhythmias are problems with the speed or rhythm of the heartbeat. The monitor is a small, portable device. You can wear one while you do your normal daily activities. This is usually used to diagnose what is causing palpitations/syncope (passing out).    Follow-Up:  You will follow up with Dr Caryl Comes as needed.  At [facility], you and your health needs are our priority.  As part of our continuing mission to provide you with exceptional heart care, we have created designated Provider Care Teams.  These Care Teams include your primary Cardiologist (physician) and Advanced Practice Providers (APPs -  Physician Assistants and Nurse Practitioners) who all work together to provide you with the care you need, when you need it.

## 2019-09-08 ENCOUNTER — Telehealth: Payer: Self-pay | Admitting: Internal Medicine

## 2019-09-08 ENCOUNTER — Ambulatory Visit: Payer: Medicare Other | Admitting: Internal Medicine

## 2019-09-08 DIAGNOSIS — R0989 Other specified symptoms and signs involving the circulatory and respiratory systems: Secondary | ICD-10-CM | POA: Diagnosis not present

## 2019-09-08 DIAGNOSIS — R404 Transient alteration of awareness: Secondary | ICD-10-CM | POA: Diagnosis not present

## 2019-09-08 DIAGNOSIS — R4020 Unspecified coma: Secondary | ICD-10-CM | POA: Diagnosis not present

## 2019-09-08 DIAGNOSIS — Z743 Need for continuous supervision: Secondary | ICD-10-CM | POA: Diagnosis not present

## 2019-09-12 ENCOUNTER — Telehealth: Payer: Self-pay | Admitting: Internal Medicine

## 2019-09-12 NOTE — Telephone Encounter (Signed)
Patient's son is calling stating the patient passed away on 2019/10/03 the day after her last office visit. He would like to know the information from last appointment on 09/07/19.

## 2019-09-13 ENCOUNTER — Other Ambulatory Visit: Payer: Self-pay | Admitting: *Deleted

## 2019-09-13 ENCOUNTER — Ambulatory Visit: Payer: Self-pay | Admitting: *Deleted

## 2019-09-13 NOTE — Patient Outreach (Signed)
[facility]) Care Management  09/13/2019  JENNIFERLYNN HODGDON 1933/03/18 [id number]   Member scheduled for outreach this morning however noted that she passed away last week in her home.  Will close case at this time.  Valente David, [place], MSN [place] [phone]

## 2019-09-15 NOTE — Telephone Encounter (Signed)
Follow up    Pts son is calling. He says he has not heard from anyone and he called on Monday    Please call back

## 2019-09-16 NOTE — Telephone Encounter (Signed)
Called and spoke with her son who is also a patient of ours  He found her; apprised that I didn't have the right to give medical information, but reiterated what he knew, that she had a bad heart, and htat the first words from her mouth when I met her wsa " I dont want surgery"  he said she was tired and ready Appreciated the phone call  

## 2019-09-19 ENCOUNTER — Telehealth: Payer: Self-pay | Admitting: Cardiology

## 2019-09-19 NOTE — Telephone Encounter (Signed)
Routed to MD/RN/medical records

## 2019-09-19 NOTE — Telephone Encounter (Signed)
Mickel Baas from [facility] called in regards to the death certificate for this patient. The request was sent to Dr. Ellyn Hack last Wednesday, but the funeral home has not gotten a response yet. The patient is to be cremated, and they would need the death certificate as soon as possible.  Please call her to let her know what the status is.

## 2019-09-20 NOTE — Telephone Encounter (Signed)
I filled out the form and signed it when it arrived here.  We can check with Ivin Booty to see how it was sent.  Glenetta Hew, MD

## 2019-09-29 ENCOUNTER — Ambulatory Visit: Payer: Medicare Other

## 2019-10-03 DIAGNOSIS — 419620001 Death: Secondary | SNOMED CT | POA: Diagnosis not present

## 2019-10-03 NOTE — Telephone Encounter (Signed)
New message    Call received from [facility],  Paramedic calling to inform office patient has expired at her home today. Paramedic to have funeral home provide death certificate to [facility]- Dr Alain Marion

## 2019-10-03 NOTE — Telephone Encounter (Signed)
FYI

## 2019-10-03 DEATH — deceased

## 2019-10-11 NOTE — Telephone Encounter (Signed)
DEATH CERT. WAS GIVEN TO [facility] THE DAY THAT DR [facility] SIGNED IT

## 2019-11-17 ENCOUNTER — Ambulatory Visit: Payer: Medicare Other | Admitting: Cardiology

## 2020-06-30 IMAGING — RF DG ESOPHAGUS
8 series · 24 of 24 positions shown · non-contrast
Comparison: None.

CLINICAL DATA: Dysphagia

EXAM:
ESOPHOGRAM / BARIUM SWALLOW / BARIUM TABLET STUDY
TECHNIQUE: Combined double contrast and single contrast examination performed
using effervescent crystals, thick barium liquid, and thin barium
liquid. The patient was observed with fluoroscopy swallowing a 13 mm
barium sulphate tablet.
FLUOROSCOPY TIME:  Fluoroscopy Time:  2 minutes, 0 seconds
Radiation Exposure Index (if provided by the fluoroscopic device):
15.5 mGy
Number of Acquired Spot Images: 0

[Series 1: cp_standard · 0.35mm/px · 3 of 48 frames shown (1 of 8)]
[frame 7/48]
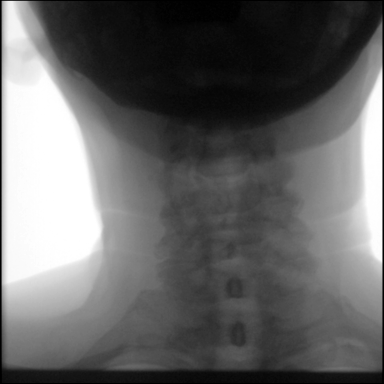
[frame 8/48]
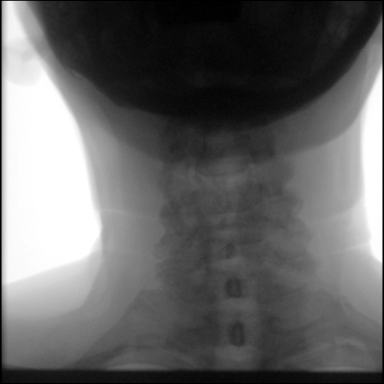
[frame 41/48]
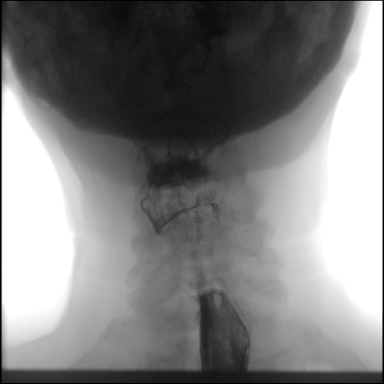

[Series 2: cp_standard · 0.34mm/px · 3 of 58 frames shown (2 of 8)]
[frame 5/58]
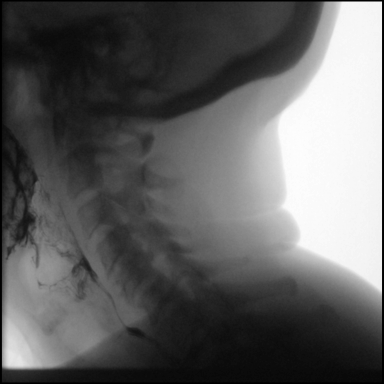
[frame 9/58]
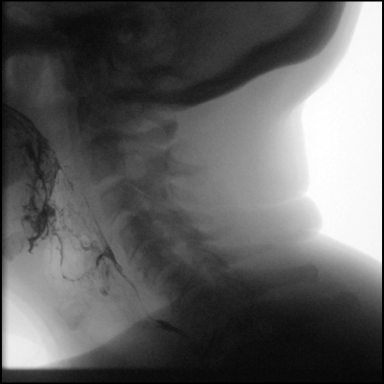
[frame 50/58]
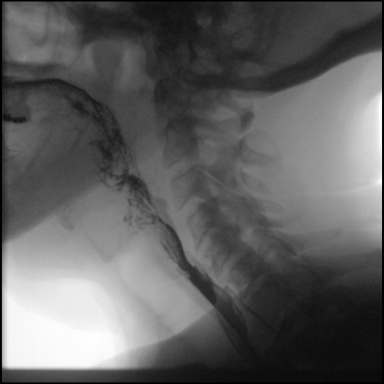

[Series 3: cp_standard · 0.34mm/px · 3 of 83 frames shown (3 of 8)]
[frame 13/83]
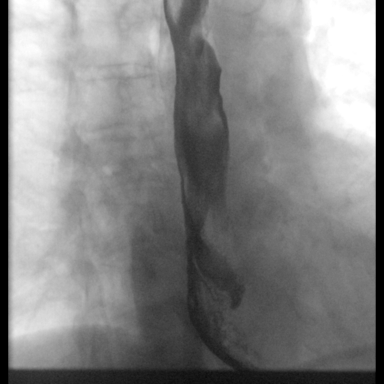
[frame 42/83]
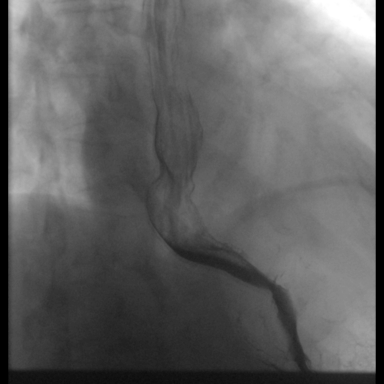
[frame 71/83]
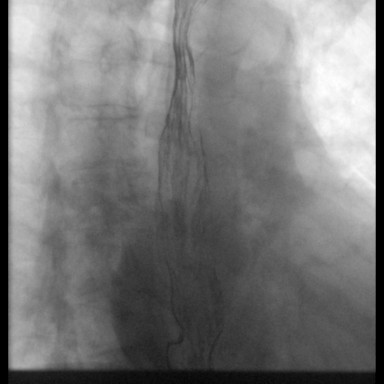

[Series 4: cp_standard · 0.35mm/px · 3 of 48 frames shown (4 of 8)]
[frame 8/48]
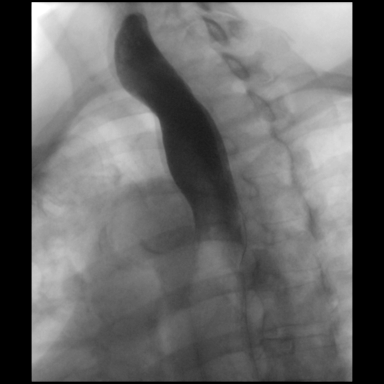
[frame 25/48]
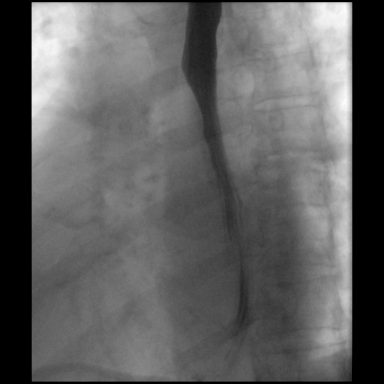
[frame 41/48]
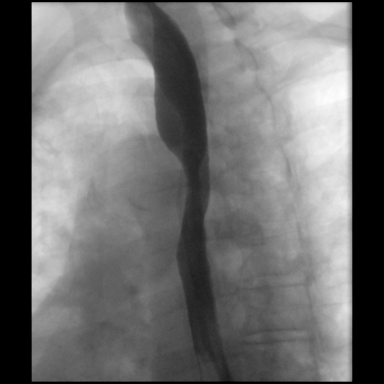

[Series 5: cp_standard · 0.35mm/px · 3 of 30 frames shown (5 of 8)]
[frame 5/30]
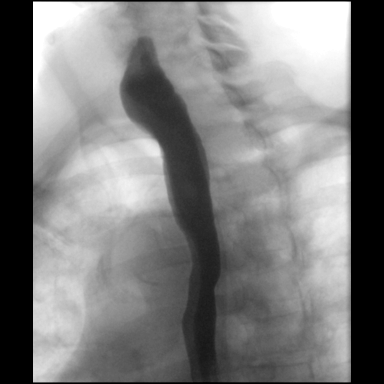
[frame 26/30]
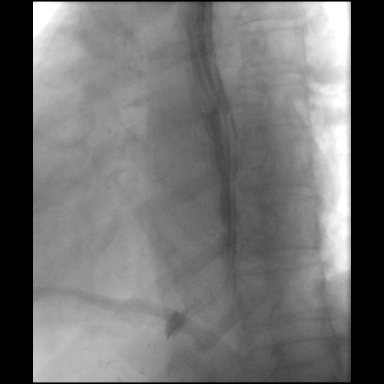
[frame 30/30]
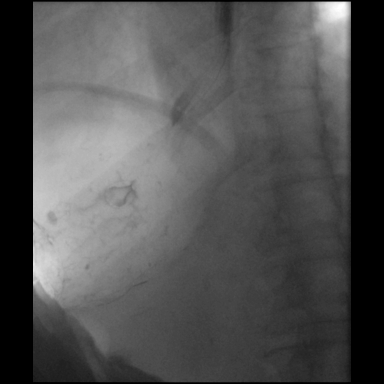

[Series 6: cp_standard · 0.35mm/px · 3 of 38 frames shown (6 of 8)]
[frame 6/38]
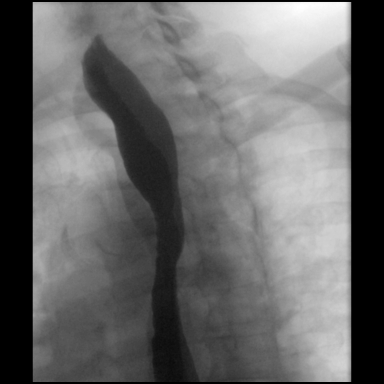
[frame 27/38]
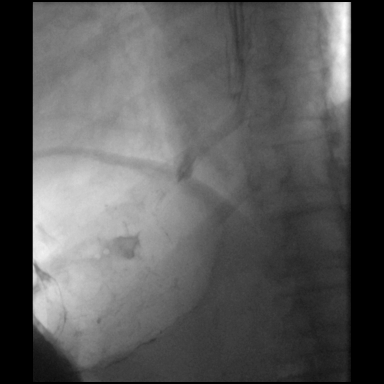
[frame 33/38]
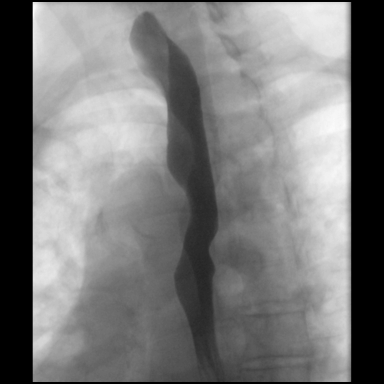

[Series 7: cp_standard · 0.36mm/px · 3 of 37 frames shown (7 of 8)]
[frame 6/37]
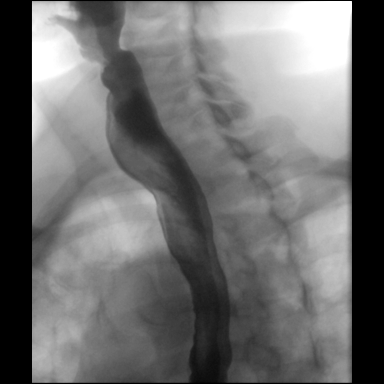
[frame 31/37]
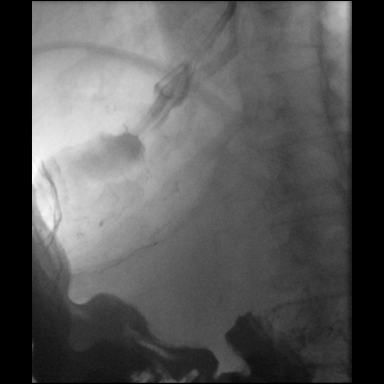
[frame 32/37]
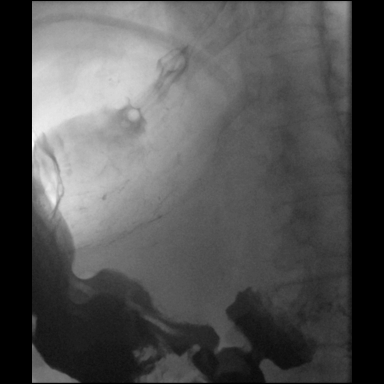

[Series 8: cp_standard · 0.36mm/px · 3 of 18 frames shown (8 of 8)]
[frame 3/18]
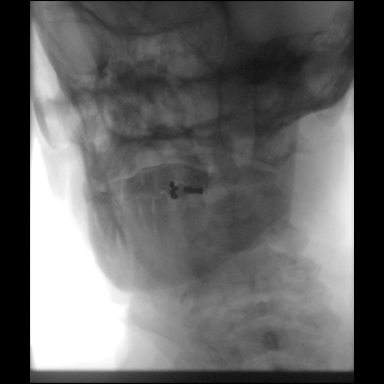
[frame 16/18]
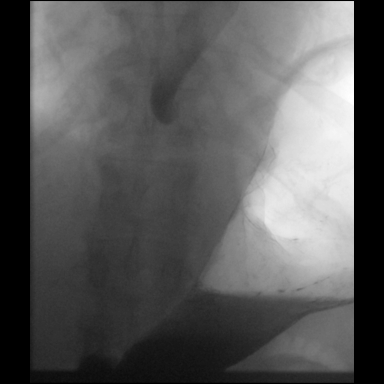
[frame 17/18]
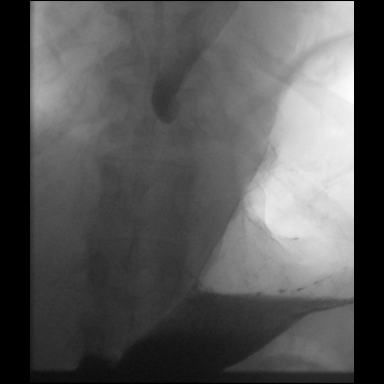

[24 of 24 positions shown; findings below may reference images not displayed]

FINDINGS: Slight delay in epiglottic inversion during the pharyngeal phase of
swallowing. No laryngeal penetration.

No esophageal narrowing or stricture is identified. There is mild
fold thickening in the distal esophagus on mucosal relief
assessment.

Primary peristaltic waves in the esophagus were disrupted on [DATE]
swallows at about the mid esophageal level. There were some
secondary and tertiary contractions in the distal esophagus.

A 13 mm barium pill passed without difficulty into the stomach.

No appreciable esophageal ulceration is identified.

Atherosclerotic calcification of the aortic arch is noted.
IMPRESSION: 1. Nonspecific esophageal dysmotility disorder, with disruption of
primary peristaltic waves on [DATE] swallows in the mid esophagus.
2. Distal esophageal fold thickening which may be a manifestation of
mild to moderate esophagitis. No ulceration is identified. No
esophageal stricture noted.
3. Mild delay in epiglottic inversion during the pharyngeal phase of
swallowing, without laryngeal penetration.
4.  Aortic Atherosclerosis ([id number]).
